# Patient Record
Sex: Male | Born: 1945
Health system: Southern US, Community
[De-identification: ages and names within clinical notes are randomized; demographics above are authoritative.]

## PROBLEM LIST (undated history)

## (undated) DIAGNOSIS — T4145XA Adverse effect of unspecified anesthetic, initial encounter: Secondary | ICD-10-CM

## (undated) DIAGNOSIS — N529 Male erectile dysfunction, unspecified: Secondary | ICD-10-CM

## (undated) DIAGNOSIS — K219 Gastro-esophageal reflux disease without esophagitis: Secondary | ICD-10-CM

## (undated) DIAGNOSIS — N401 Enlarged prostate with lower urinary tract symptoms: Secondary | ICD-10-CM

## (undated) DIAGNOSIS — E785 Hyperlipidemia, unspecified: Secondary | ICD-10-CM

## (undated) DIAGNOSIS — Z87442 Personal history of urinary calculi: Secondary | ICD-10-CM

## (undated) DIAGNOSIS — N200 Calculus of kidney: Secondary | ICD-10-CM

## (undated) DIAGNOSIS — T8859XA Other complications of anesthesia, initial encounter: Secondary | ICD-10-CM

## (undated) DIAGNOSIS — Z9189 Other specified personal risk factors, not elsewhere classified: Secondary | ICD-10-CM

## (undated) DIAGNOSIS — Z8739 Personal history of other diseases of the musculoskeletal system and connective tissue: Secondary | ICD-10-CM

## (undated) DIAGNOSIS — M1990 Unspecified osteoarthritis, unspecified site: Secondary | ICD-10-CM

## (undated) DIAGNOSIS — K4020 Bilateral inguinal hernia, without obstruction or gangrene, not specified as recurrent: Secondary | ICD-10-CM

## (undated) DIAGNOSIS — Z87820 Personal history of traumatic brain injury: Secondary | ICD-10-CM

## (undated) DIAGNOSIS — I480 Paroxysmal atrial fibrillation: Secondary | ICD-10-CM

## (undated) HISTORY — PX: TONSILLECTOMY: SUR1361

## (undated) HISTORY — PX: JOINT REPLACEMENT: SHX530

## (undated) HISTORY — PX: CARPAL TUNNEL RELEASE: SHX101

## (undated) HISTORY — PX: CARDIOVASCULAR STRESS TEST: SHX262

## (undated) HISTORY — PX: HERNIA REPAIR: SHX51

---

## 1981-09-29 HISTORY — PX: APPENDECTOMY: SHX54

## 2000-03-30 ENCOUNTER — Ambulatory Visit (HOSPITAL_COMMUNITY): Admission: RE | Admit: 2000-03-30 | Discharge: 2000-03-30 | Payer: Self-pay | Admitting: Urology

## 2000-03-30 ENCOUNTER — Encounter: Payer: Self-pay | Admitting: Urology

## 2002-05-11 ENCOUNTER — Inpatient Hospital Stay (HOSPITAL_COMMUNITY): Admission: RE | Admit: 2002-05-11 | Discharge: 2002-05-14 | Payer: Self-pay | Admitting: Orthopaedic Surgery

## 2002-05-11 HISTORY — PX: TOTAL HIP ARTHROPLASTY: SHX124

## 2007-01-19 ENCOUNTER — Ambulatory Visit: Payer: Self-pay | Admitting: Internal Medicine

## 2007-01-29 ENCOUNTER — Ambulatory Visit: Payer: Self-pay | Admitting: Internal Medicine

## 2007-07-15 LAB — HM COLONOSCOPY: HM Colonoscopy: NORMAL

## 2010-03-25 ENCOUNTER — Ambulatory Visit: Payer: Self-pay | Admitting: Family Medicine

## 2010-03-26 ENCOUNTER — Telehealth (INDEPENDENT_AMBULATORY_CARE_PROVIDER_SITE_OTHER): Payer: Self-pay | Admitting: *Deleted

## 2010-03-26 DIAGNOSIS — R9431 Abnormal electrocardiogram [ECG] [EKG]: Secondary | ICD-10-CM | POA: Insufficient documentation

## 2010-04-02 ENCOUNTER — Encounter: Payer: Self-pay | Admitting: Family Medicine

## 2010-04-15 ENCOUNTER — Encounter: Payer: Self-pay | Admitting: Family Medicine

## 2010-05-01 ENCOUNTER — Encounter: Payer: Self-pay | Admitting: Cardiology

## 2010-05-01 ENCOUNTER — Ambulatory Visit: Payer: Self-pay | Admitting: Cardiology

## 2010-05-01 DIAGNOSIS — I480 Paroxysmal atrial fibrillation: Secondary | ICD-10-CM | POA: Insufficient documentation

## 2010-05-01 DIAGNOSIS — E783 Hyperchylomicronemia: Secondary | ICD-10-CM | POA: Insufficient documentation

## 2010-05-01 DIAGNOSIS — F17200 Nicotine dependence, unspecified, uncomplicated: Secondary | ICD-10-CM | POA: Insufficient documentation

## 2010-05-07 ENCOUNTER — Telehealth (INDEPENDENT_AMBULATORY_CARE_PROVIDER_SITE_OTHER): Payer: Self-pay | Admitting: *Deleted

## 2010-05-08 ENCOUNTER — Encounter: Payer: Self-pay | Admitting: Cardiology

## 2010-05-08 ENCOUNTER — Ambulatory Visit: Payer: Self-pay

## 2010-05-08 ENCOUNTER — Ambulatory Visit (HOSPITAL_COMMUNITY): Admission: RE | Admit: 2010-05-08 | Discharge: 2010-05-08 | Payer: Self-pay | Admitting: Cardiology

## 2010-05-08 ENCOUNTER — Ambulatory Visit: Payer: Self-pay | Admitting: Cardiology

## 2010-05-08 ENCOUNTER — Encounter (HOSPITAL_COMMUNITY): Admission: RE | Admit: 2010-05-08 | Discharge: 2010-06-24 | Payer: Self-pay | Admitting: Cardiology

## 2010-05-08 HISTORY — PX: TRANSTHORACIC ECHOCARDIOGRAM: SHX275

## 2010-05-31 ENCOUNTER — Ambulatory Visit: Payer: Self-pay | Admitting: Cardiology

## 2010-08-09 ENCOUNTER — Telehealth: Payer: Self-pay | Admitting: Family Medicine

## 2010-08-12 ENCOUNTER — Encounter: Payer: Self-pay | Admitting: Family Medicine

## 2010-08-12 DIAGNOSIS — K4090 Unilateral inguinal hernia, without obstruction or gangrene, not specified as recurrent: Secondary | ICD-10-CM | POA: Insufficient documentation

## 2010-08-12 DIAGNOSIS — K469 Unspecified abdominal hernia without obstruction or gangrene: Secondary | ICD-10-CM

## 2010-08-15 ENCOUNTER — Telehealth (INDEPENDENT_AMBULATORY_CARE_PROVIDER_SITE_OTHER): Payer: Self-pay | Admitting: *Deleted

## 2010-08-16 ENCOUNTER — Encounter: Payer: Self-pay | Admitting: Family Medicine

## 2010-08-26 ENCOUNTER — Ambulatory Visit: Payer: Self-pay | Admitting: Family Medicine

## 2010-09-02 ENCOUNTER — Telehealth (INDEPENDENT_AMBULATORY_CARE_PROVIDER_SITE_OTHER): Payer: Self-pay | Admitting: *Deleted

## 2010-09-04 ENCOUNTER — Ambulatory Visit (HOSPITAL_COMMUNITY)
Admission: RE | Admit: 2010-09-04 | Discharge: 2010-09-04 | Payer: Self-pay | Source: Home / Self Care | Attending: Surgery | Admitting: Surgery

## 2010-09-04 HISTORY — PX: LAPAROSCOPIC INGUINAL HERNIA REPAIR: SUR788

## 2010-09-17 ENCOUNTER — Encounter: Payer: Self-pay | Admitting: Family Medicine

## 2010-10-27 LAB — CONVERTED CEMR LAB
ALT: 26 units/L (ref 0–53)
AST: 37 units/L (ref 0–37)
Albumin: 4.6 g/dL (ref 3.5–5.2)
Alkaline Phosphatase: 65 units/L (ref 39–117)
BUN: 22 mg/dL (ref 6–23)
Basophils Absolute: 0.2 10*3/uL — ABNORMAL HIGH (ref 0.0–0.1)
Basophils Relative: 2.4 % (ref 0.0–3.0)
Bilirubin, Direct: 0.2 mg/dL (ref 0.0–0.3)
CO2: 31 meq/L (ref 19–32)
Calcium: 8.9 mg/dL (ref 8.4–10.5)
Chloride: 105 meq/L (ref 96–112)
Cholesterol: 199 mg/dL (ref 0–200)
Creatinine, Ser: 1.1 mg/dL (ref 0.4–1.5)
Direct LDL: 121.7 mg/dL
Eosinophils Absolute: 0.1 10*3/uL (ref 0.0–0.7)
Eosinophils Relative: 1.4 % (ref 0.0–5.0)
GFR calc non Af Amer: 74.31 mL/min (ref >60)
Glucose, Bld: 100 mg/dL — ABNORMAL HIGH (ref 70–99)
HCT: 37.6 % — ABNORMAL LOW (ref 39.0–52.0)
HDL: 41.2 mg/dL (ref >39.00)
Hemoglobin: 12.8 g/dL — ABNORMAL LOW (ref 13.0–17.0)
Lymphocytes Relative: 20.1 % (ref 12.0–46.0)
Lymphs Abs: 1.7 10*3/uL (ref 0.7–4.0)
MCHC: 34.1 g/dL (ref 30.0–36.0)
MCV: 92 fL (ref 78.0–100.0)
Monocytes Absolute: 0.6 10*3/uL (ref 0.1–1.0)
Monocytes Relative: 7 % (ref 3.0–12.0)
Neutro Abs: 6 10*3/uL (ref 1.4–7.7)
Neutrophils Relative %: 69.1 % (ref 43.0–77.0)
PSA: 1.27 ng/mL (ref 0.10–4.00)
Platelets: 206 10*3/uL (ref 150.0–400.0)
Potassium: 4.4 meq/L (ref 3.5–5.1)
RBC: 4.08 M/uL — ABNORMAL LOW (ref 4.22–5.81)
RDW: 12.8 % (ref 11.5–14.6)
Sodium: 143 meq/L (ref 135–145)
TSH: 2.16 microintl units/mL (ref 0.35–5.50)
Total Bilirubin: 1 mg/dL (ref 0.3–1.2)
Total CHOL/HDL Ratio: 5
Total Protein: 7.4 g/dL (ref 6.0–8.3)
Triglycerides: 229 mg/dL — ABNORMAL HIGH (ref 0.0–149.0)
VLDL: 45.8 mg/dL — ABNORMAL HIGH (ref 0.0–40.0)
WBC: 8.6 10*3/uL (ref 4.5–10.5)

## 2010-10-29 NOTE — Miscellaneous (Signed)
Summary: Appointment No Show  Appointment status changed to no show by [employer] on 04/15/2010 10:36 AM.  No Show Comments ---------------- echo/abn ekg/Asymtomatic Bigeminal Premature Contractions  Appointment Information ----------------------- Appt Type:  CARDIOLOGY ANCILLARY VISIT      Date:  Friday, April 12, 2010      Time:  4:00 PM for 60 min   Urgency:  Routine   Made By:  Pearson Grippe  To Visit:  [id number]    Reason:  echo/abn ekg/Asymtomatic Bigeminal Premature Contractions  Appt Comments ------------- -- 04/15/10 10:36: ([facility]) NO SHOW -- echo/abn ekg/Asymtomatic Bigeminal Premature Contractions -- 04/11/10 14:32: ([facility]) BOOKED -- Routine CARDIOLOGY ANCILLARY VISIT at 04/12/2010 4:00 PM for 60 min echo/abn ekg/Asymtomatic Bigeminal Premature Contracti

## 2010-10-29 NOTE — Progress Notes (Signed)
Summary: FENOFIBRATE REFILL   Phone Note Refill Request Message from:  Fax from Pharmacy on August 15, 2010 8:43 AM  Refills Requested: Medication #1:  FENOFIBRATE 160 MG TABS take one tablet at bedtime [facility] [address] - fax [phone] - tel [phone]  Next Appointment Scheduled: none Initial call taken by: Okey Regal [place],  August 15, 2010 8:45 AM    Prescriptions: FENOFIBRATE 160 MG TABS (FENOFIBRATE) take one tablet at bedtime  #30 x 3   Entered by:   Doristine Devoid CMA   Authorized by:   Loreen Freud DO   Signed by:   Doristine Devoid CMA on 08/15/2010   Method used:   Electronically to        [facility] [address] (retail)       [phone]       [place], [place]  [address]       Ph: [phone]       Fax: [phone]   RxID:   [id number]

## 2010-10-29 NOTE — Progress Notes (Signed)
Summary: Nuclear Pre-Procedure  Phone Note Outgoing Call Call back at [facility] [place] Phone [phone]   Call placed by: Stanton Kidney, EMT-P,  May 07, 2010 3:28 PM Action Taken: Phone Call Completed Summary of Call: Reviewed information on Myoview Information Sheet (see scanned document for further details).  Spoke with the Patient's wife.    Nuclear Med Background Indications for Stress Test: Evaluation for Ischemia, Abnormal EKG    History Comments: H/O PVC's     Nuclear Pre-Procedure Cardiac Risk Factors: Lipids, Smoker Height (in): 66.75

## 2010-10-29 NOTE — Assessment & Plan Note (Signed)
Summary: [facility]   Visit Type:  4 wk f/u Primary Provider:  Dr. Beverely Low  CC:  pt here for f/u on stress test 8/10.Marland KitchenMarland Kitchenpt weight is up 8 lb since 05-08-10 and pt states he thinks this is due to what he has on today though he does state that he has been eating more lately....  History of Present Illness: Mr. Sibal comes in today for further evaluation and management of his paroxysmal atrial fibrillation.  His 2-D echocardiogram was essentially normal except for trivial mitral regurgitation and minimal LA dilatation. Stress Myoview demonstrated fairly good exercise capacity going 6 minutes and 30 seconds. Stopped secondary to bilateral calf tightness right greater than left. There were no ST segment changes. He went a total for ablation and what appears and flutter with rapid ventricular rate in recovery the covered with a vagal maneuver. Maximum heart rate was recorded 150 beats per minute. His ejection fraction was 54% with normal Alayziah Tangeman motion with no evidence of scar or ischemia.  Current Medications (verified): 1)  Fenofibrate 160 Mg Tabs (Fenofibrate) .... Take One Tablet At Bedtime 2)  Aspirin 325 Mg Tabs (Aspirin) .... Take 1 Daily  Enteric Coated 3)  Calcium 500-125 Mg-Unit Tabs (Calcium Carbonate-Vitamin D) .Marland Kitchen.. 1 Tab Once Daily 4)  Multivitamins   Tabs (Multiple Vitamin) .Marland Kitchen.. 1 Tab Once Daily  Allergies: 1)  ! Coumadin  Past History:  Past Medical History: Last updated: 04/26/2010 Left hip osteoarthritis  Past Surgical History: Last updated: 03/25/2010 L hip replacement Appendectomy Hernia repair-Bilateral  Family History: Last updated: 03/25/2010 CAD-no HTN-father DM-sister STROKE-no COLON CA-no PROSTATE CA-father, dx'd  ~70  Social History: Last updated: 03/25/2010 married 2 children- 1 lives in [place], [place] in [place] [employer] for the [employer]  Risk Factors: Alcohol Use: <1 (03/25/2010) Exercise: yes (03/25/2010)  Risk  Factors: Smoking Status: current (03/25/2010) Cigars/wk: 2-3 (03/25/2010)  Clinical Reports Reviewed:  Nuclear Study:  05/08/2010:  Excerise capacity: Fair exercise capacity  Blood Pressure response: Normal blood pressure response  Clinical symptoms: Calf tightness, no chest pain  ECG impression: No ischemic ST changes but patient had runs of both atrial fibrillation and atrial flutter during the study.  Overall impression: No evidence for scar or ischemia. Patient had both atrial fibrillation and atrial flutter during this study.  Marca Ancona, MD   Review of Systems       negative other than history of present illness  Vital Signs:  Patient profile:   65 year old male Height:      66 inches Weight:      187.12 pounds BMI:     30.31 Pulse rate:   68 / minute Pulse rhythm:   regular BP sitting:   116 / 78  (left arm) Cuff size:   large  Vitals Entered By: Danielle Rankin, CMA (May 31, 2010 2:17 PM)  Physical Exam  General:  Well developed, well nourished, in no acute distress. Head:  normocephalic and atraumatic Eyes:  PERRLA/EOM intact; conjunctiva and lids normal. Neck:  Neck supple, no JVD. No masses, thyromegaly or abnormal cervical nodes. Chest [place]:  no deformities or breast masses noted Lungs:  Clear bilaterally to auscultation and percussion. Heart:  PMI nondisplaced, regular rate and rhythm no change Msk:  Back normal, normal gait. Muscle strength and tone normal. Pulses:  pulses normal in all 4 extremities Extremities:  No clubbing or cyanosis. Neurologic:  Alert and oriented x 3. Skin:  Intact without lesions or rashes. Psych:  Normal  affect.   Impression & Recommendations:  Problem # 1:  ATRIAL FIBRILLATION (ICD-427.31) Assessment Unchanged He is totally asymptomatic. His maximum rate even after exercise was about 150 beats per minute. He converted with a vagal maneuver. He is risk of having a thromboembolic stroke is very low and aspirin  should be adequate. I have taught him how to check his carotid pulse each morning and how to recognize atrial fibrillation. If he has it, and is asymptomatic, and he does not convert within 48 hours he is to report to the office or to the hospital. Otherwise we'll try to maintain conservative treatment. His updated medication list for this problem includes:    Aspirin 325 Mg Tabs (Aspirin) .Marland Kitchen... 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 Take one tablet at bedtime  Patient Instructions: 1)  Your physician recommends that you schedule a follow-up appointment in: 6 months with Dr. Daleen Squibb 2)  Please check your carotid pulse every morning and record as instructed to you by Dr. Daleen Squibb 3)  Your physician recommends that you continue on your current medications as directed. Please refer to the Current Medication list given to you today.

## 2010-10-29 NOTE — Assessment & Plan Note (Signed)
Summary: Cardiology Nuclear Testing  Nuclear Med Background Indications for Stress Test: Evaluation for Ischemia, Abnormal EKG  Indications Comments: New AFIB recently   History Comments: H/O PVC's     Nuclear Pre-Procedure Cardiac Risk Factors: Lipids, Smoker Caffeine/Decaff Intake: None NPO After: 11:00 PM Lungs: Clear IV 0.9% NS with Angio Cath: 22g     IV Site: (R) AC IV Started by: Irean Hong RN Chest Size (in) 43     Height (in): 66 Weight (lb): 179 BMI: 29.00 Tech Comments: The patient went into Atrial Flutter/AFIB with RVR at 2 minutes in recovery.The patient converted  back into NSR after vagal manuever..Dr.B. Crenshaw reviewed EKG's and he recommended no change in treatment today, and for the patient to keep follow-up with Dr. Algis Greenhouse per Burna Mortimer Deal/ Cleon Gustin  Nuclear Med Study 1 or 2 day study:  1 day     Stress Test Type:  Stress Reading MD:  Marca Ancona, MD     Referring MD:  T.Wall Resting Radionuclide:  Technetium 26m Tetrofosmin     Resting Radionuclide Dose:  10.8 mCi  Stress Radionuclide:  Technetium 68m Tetrofosmin     Stress Radionuclide Dose:  33.0 mCi   Stress Protocol Exercise Time (min):  6:30 min     Max HR:  150 bpm     Predicted Max HR:  158 bpm  Max Systolic BP: 158 mm Hg     Percent Max HR:  94.94 %     METS: 7.4 Rate Pressure Product:  78295    Stress Test Technologist:  Irean Hong RN     Nuclear Technologist:  Burna Mortimer Deal RT-N  Rest Procedure  Myocardial perfusion imaging was performed at rest 45 minutes following the intravenous administration of Myoview Technetium 21m Tetrofosmin.  Stress Procedure  The patient exercised for 6 minutes and 30 seconds.   The patient stopped due to bilateral calf tightness R>L, DOE  and denied any chest pain.  There were no significant ST-T wave changes, begiminy PVC's,occ. PAC. The patient went into atrial flutter/AFIB with RVR in recovery that converted after vagal maneuver.  Myoview was  injected at peak exercise and myocardial perfusion imaging was performed after a brief delay.  QPS Raw Data Images:  Normal; no motion artifact; normal heart/lung ratio. Stress Images:  NI: Uniform and normal uptake of tracer in all myocardial segments. Rest Images:  Uniform and normal uptake of tracer in all myocardial segments. Subtraction (SDS):  There is no evidence of scar or ischemia. Transient Ischemic Dilatation:  1.00  (Normal <1.22)  Lung/Heart Ratio:  .37  (Normal <0.45)  Quantitative Gated Spect Images QGS EDV:  91 ml QGS ESV:  42 ml QGS EF:  54 % QGS cine images:  Normal wall motion.    Overall Impression  Exercise Capacity: Fair exercise capacity. BP Response: Normal blood pressure response. Clinical Symptoms: Calf tightness, no chest pain.  ECG Impression: No ischemic ST changes but patient had runs of both atrial fibrillation and atrial flutter during the study.  Overall Impression: No evidence for scar or ischemia.  Patient had both atrial fibrillation and atrial flutter during this study.   Appended Document: Cardiology Nuclear Testing Good news. No suggestion of obsrtuctive CAD. Kep followup appointment.  Appended Document: Cardiology Nuclear Testing LMTCB. Mylo Red RN  Appended Document: Cardiology Nuclear Testing LMTCB. Mylo Red RN  Appended Document: Cardiology Nuclear Testing Pt's wife aware of results.  They would like a copy of the report at his next visit  for his daughter who is a cardiologist ( [employer]). Mylo Red RN

## 2010-10-29 NOTE — Assessment & Plan Note (Signed)
Summary: treadmil abn ekg/jm   Visit Type:  new pt visit Primary Provider:  Dr. Beverely Low  CC:  pt was here today for a treadmill test in our office and was presented to Dr. Daleen Squibb w/pt in afib as per EKG ...pt was then added on to schedule today as new pt visit....pt offers no cardiac complaints today and says he plays tennis 2-3 times weekly w/o problems.  History of Present Illness: [name] Short is a pleasant 65 year old white male who came today for treadmill for PVCs. He is referred by Dr. Beverely Low.  When he arrived for the treadmill, he was found to be in atrial fibrillation which is a new diagnosis. Heart rates running about 80-90 beats per minute. There no other EKG changes.  He is totally asymptomatic. He specifically denies any palpitations, dyspnea on exertion, presyncope or syncope.  He specifically has no history of hypertension, diabetes, coronary artery disease, congestive heart failure, or valvular heart disease.  He has been on Coumadin after hip surgery years ago. He apparently had a rash that was over his back at that time and was told he was allergic to the drug.  He only drinks an occasional ear. He does smoke about 2-3 cigars a week.  He plays tennis 2-3 times a week.  He denies any bleeding history or diathesis.  We canceled the treadmill.    Current Medications (verified): 1)  Fenofibrate 160 Mg Tabs (Fenofibrate) .... Take One Tablet At Bedtime  Allergies: 1)  ! 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  Risk Factors: Alcohol  Use: <1 (03/25/2010) Exercise: yes (03/25/2010)  Risk Factors: Smoking Status: current (03/25/2010) Cigars/wk: 2-3 (03/25/2010)  Review of Systems       negative other than history of present illness  Vital Signs:  Patient profile:   65 year old male Height:      66.75 inches Weight:      185 pounds BMI:     29.30 Pulse rate:   70 / minute Pulse rhythm:   irregular BP sitting:   108 / 80  (left arm) Cuff size:   large  Vitals Entered By: Danielle Rankin, CMA (May 01, 2010 4:29 PM)  Physical Exam  General:  no acute distress, mildly overweight Head:  normocephalic and atraumatic Eyes:  PERRLA/EOM intact; conjunctiva and lids normal. Neck:  Neck supple, no JVD. No masses, thyromegaly or abnormal cervical nodes. Chest [name] Short:  no deformities or breast masses noted Lungs:  Clear bilaterally to auscultation and percussion. Heart:  PMI nondisplaced, irregular rate and rhythm, there S1-S2, no murmur. Abdomen:  Bowel sounds positive; abdomen soft and non-tender without masses, organomegaly, or hernias noted. No hepatosplenomegaly. Msk:  Back normal, normal gait. Muscle strength and tone normal. Pulses:  pulses normal in all 4 extremities Extremities:  No clubbing or cyanosis. Neurologic:  Alert and oriented x 3. Skin:  Intact without lesions or rashes. Psych:  Normal affect.   EKG  Procedure date:  05/01/2010  Findings:      patient fibrillation, no ST segment changes.  Impression & Recommendations:  Problem # 1:  ATRIAL FIBRILLATION (ICD-427.31) Assessment New This is a  new diagnosis. He is totally asymptomatic. Rate well controlled. Exam is grossly normal. EKG is unremarkable except for the atrial fib. Recent blood work including a TSH was normal. He is a low risk of thromboembolic stroke.  I asked him to start aspirin 325 mg a day. We'll arrange for 2-D echocardiogram and stress Myoview with his cardiac risk factors. We'll do a exercise study looking at heart rate  response as well as blood pressure. I will not start any rate slowing drugs today. His updated medication list for this problem includes:    Aspirin 325 Mg Tabs (Aspirin) .Marland Kitchen... Take 1 daily  enteric coated  Orders: EKG w/ Interpretation (93000) Nuclear Stress Test (Nuc Stress Test) Echocardiogram (Echo)  Problem # 2:  TOBACCO USER (ICD-305.1) advised to quit  Problem # 3:  ABNORMAL ELECTROCARDIOGRAM (ICD-794.31)  His updated medication list for this problem includes:    Aspirin 325 Mg Tabs (Aspirin) .Marland Kitchen... 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 Take one tablet at bedtime  Patient Instructions: 1)  Your physician recommends that you schedule a follow-up appointment in: 3-4 WEEKS with Dr. Daleen Squibb  05/31/10 at 1:45 pm 2)  Your physician has recommended you make the following change in your medication: START  EC Aspirin 325mg  daily 3)  Your physician has requested that you have an echocardiogram.  Echocardiography is a painless test that uses sound waves to create images of your heart. It provides your doctor with information about the size and shape of your heart and how well your heart's chambers and valves are working.  This procedure takes approximately one hour. There are no restrictions for this procedure. 4)  Your physician has requested that you have an exercise stress myoview.  For further information please visit [email or web address]  Please follow instruction sheet, as given.

## 2010-10-29 NOTE — Progress Notes (Signed)
Summary: Records Request  Faxed OV, Echo & Stress to [facility] at [facility] Pre[facility] ([phone]). Debby Freiberg  September 02, 2010 8:46 AM

## 2010-10-29 NOTE — Progress Notes (Signed)
Summary: surgeon referral (lmom 11/11)--pt called back  Phone Note Call from Patient Call back at Home Phone [phone]   Summary of Call: Patient spouse left message on triage that it was recommended to husband that he have hernia repair. Please make referral and call her with appt.  Is this correct and which surgeon do you prefer? Initial call taken by: Lucious Groves CMA,  August 09, 2010 8:22 AM  Follow-up for Phone Call        not sure where pt has hernia or who recommended repair.  have only seen pt 1 time.  if pt truly has hernia have no problem w/ him seeing [facility] for appt. Follow-up by: Neena Rhymes MD,  August 09, 2010 8:54 AM  Additional Follow-up for Phone Call Additional follow up Details #1::        left message to call back.Harold Barban  August 09, 2010 9:22 AM  Patient's wife called back--says that hernia is in groin area--she does not know which side--per above note, please refer him to [facility] for appt.Jerolyn Shin  August 09, 2010 4:17 PM    Additional Follow-up for Phone Call Additional follow up Details #2::    PATIENT SCHEDULED W/CCS ON 08-27-2010 (1ST AVAIL), PATIENT NOTIFIED. Magdalen Spatz [facility]  August 12, 2010 9:22 AM

## 2010-10-29 NOTE — Miscellaneous (Signed)
Summary: Orders Update  Clinical Lists Changes  Problems: Added new problem of HERNIA (ICD-553.9) Orders: Added new Referral order of Surgical Referral (Surgery) - Signed

## 2010-10-29 NOTE — Assessment & Plan Note (Signed)
Summary: new to estab/cbs   Vital Signs:  Patient profile:   65 year old male Height:      66.75 inches Weight:      185 pounds BMI:     29.30 Pulse rate:   68 / minute BP sitting:   120 / 80  (left arm)  Vitals Entered By: Doristine Devoid (March 25, 2010 10:12 AM) CC: NEW EST- CPX AND LABS    History of Present Illness: 65 yo man here today to establish care.  previous MD- Kindl.  desires CPE.  no concerns.  Preventive Screening-Counseling & Management  Alcohol-Tobacco     Alcohol drinks/day: <1     Smoking Status: current     Cigars/week: 2-3  Caffeine-Diet-Exercise     Does Patient Exercise: yes     Type of exercise: tennis     Times/week: <3      Sexual History:  currently monogamous.        Drug Use:  never.    Current Medications (verified): 1)  None  Allergies (verified): 1)  ! 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    Physical Exam  General:  Well-developed,well-nourished,in no acute distress; alert,appropriate and cooperative throughout examination Head:  Normocephalic and atraumatic without obvious abnormalities. No apparent alopecia or  balding. Eyes:  No corneal or conjunctival inflammation noted. EOMI. Perrla. Funduscopic exam benign, without hemorrhages, exudates or papilledema. Vision grossly normal. Ears:  External ear exam shows no significant lesions or deformities.  Otoscopic examination reveals clear canals, tympanic membranes are intact bilaterally without bulging, retraction, inflammation or discharge. Hearing is grossly normal bilaterally. Nose:  External nasal examination shows no deformity or inflammation. Nasal mucosa are pink and moist without lesions or exudates. Mouth:  Oral mucosa and oropharynx without lesions or exudates.  Teeth in good repair. Neck:  No deformities, masses, or tenderness noted. Lungs:  Normal respiratory effort, chest expands symmetrically. Lungs are clear to auscultation, no crackles or wheezes. Heart:  reg S1/S2, no M/R/G Abdomen:  Bowel sounds positive,abdomen soft and non-tender without masses, organomegaly or hernias noted. Rectal:  No external abnormalities noted. Normal sphincter tone. No rectal masses or tenderness. Genitalia:  Testes bilaterally descended without nodularity, tenderness or masses. No scrotal masses or lesions. No penis lesions or urethral discharge. Prostate:  Prostate gland firm and smooth, no enlargement, nodularity, tenderness, mass, asymmetry or induration. Msk:  No deformity or scoliosis noted of thoracic or lumbar spine.   Pulses:  +2 carotid, radial, DP Extremities:  No clubbing, cyanosis, edema, or deformity noted with normal full range of motion of all joints.   Neurologic:  No cranial nerve deficits noted. Station and gait are normal. Plantar reflexes are down-going bilaterally. DTRs are symmetrical throughout. Sensory, motor and coordinative functions appear intact. Skin:  Intact without suspicious lesions or rashes Cervical Nodes:  No  lymphadenopathy noted Inguinal Nodes:  No significant adenopathy Psych:  Cognition and judgment appear intact. Alert and  cooperative with normal attention span and concentration. No apparent delusions, illusions, hallucinations   Impression & Recommendations:  Problem # 1:  PHYSICAL EXAMINATION (ICD-V70.0) Assessment New Pt's PE WNL.  obtained EKG as baseline- in bigeminy.  will refer to cards for ECHO and treadmill stress test.  check labs.  anticipatory guidance provided. Orders: Venipuncture (16109) TLB-Lipid Panel (80061-LIPID) TLB-BMP (Basic Metabolic Panel-BMET) (80048-METABOL) TLB-CBC Platelet - w/Differential (85025-CBCD) TLB-Hepatic/Liver Function Pnl (80076-HEPATIC) TLB-TSH (Thyroid Stimulating Hormone) (84443-TSH) TLB-PSA (Prostate Specific Antigen) (84153-PSA) EKG w/ Interpretation (93000)  Problem # 2:  ABNORMAL ELECTROCARDIOGRAM (ICD-794.31) Assessment: New  See above  Orders: Echo Referral (Echo) Cardiology Referral (Cardiology)  Complete Medication List: 1)  Fenofibrate 160 Mg Tabs (Fenofibrate) .... Take one tablet at bedtime  Patient Instructions: 1)  We'll notify you of your lab results 2)  Keep up the good work on diet and exercise- your exam looks great! 3)  I would recommend a skin check by a dermatologist- call if you would like a referral 4)  Call with any questions or concerns 5)  Have a great summer!!

## 2010-10-29 NOTE — Progress Notes (Signed)
Summary: labs  Phone Note Outgoing Call   Call placed by: Doristine Devoid,  March 26, 2010 1:31 PM Call placed to: Patient Summary of Call: labs look good w/ exception of elevated triglycerides- needs to start fenofibrate 160mg  nightly and recheck LFTs in 6-8 weeks.  Follow-up for Phone Call        left message on machine ..........Marland KitchenDoristine Devoid  March 26, 2010 1:31 PM   spoke w/ patient aware of labs and referral for echo....Marland KitchenMarland KitchenDoristine Devoid  March 27, 2010 10:47 AM   New Problems: ABNORMAL ELECTROCARDIOGRAM (ICD-794.31)   New Problems: ABNORMAL ELECTROCARDIOGRAM (ICD-794.31) New/Updated Medications: FENOFIBRATE 160 MG TABS (FENOFIBRATE) take one tablet at bedtime Prescriptions: FENOFIBRATE 160 MG TABS (FENOFIBRATE) take one tablet at bedtime  #30 x 3   Entered by:   Doristine Devoid   Authorized by:   Neena Rhymes MD   Signed by:   Doristine Devoid on 03/27/2010   Method used:   Electronically to        [facility] [address] (retail)       [phone]       [place], [place]  [address]       Ph: [phone]       Fax: [phone]   RxID:   [id number]   Appended Document: labs pt also has referral for exercise stress test.  EKG showed variation on a normal rhythm- this is likely nothing to worry about but after talking w/ cardiology they recommended pt have both ECHO and stress test.  please let him know.

## 2010-10-29 NOTE — Assessment & Plan Note (Signed)
Summary: FLU SHOT/CBS  Nurse Visit   Allergies: 1)  ! Coumadin  Orders Added: 1)  Admin 1st Vaccine [90471] 2)  Flu Vaccine 52yrs + [16109] Flu Vaccine Consent Questions     Do you have a history of severe allergic reactions to this vaccine? no    Any prior history of allergic reactions to egg and/or gelatin? no    Do you have a sensitivity to the preservative Thimersol? no    Do you have a past history of Guillan-Barre Syndrome? no    Do you currently have an acute febrile illness? no    Have you ever had a severe reaction to latex? no    Vaccine information given and explained to patient? yes    Are you currently pregnant? no    Lot Number:[id number]   Exp Date:03/29/2011   Site Given  Left Deltoid IM .lbflu

## 2010-10-29 NOTE — Consult Note (Signed)
Summary: [facility]   Imported By: Lanelle Bal 08/29/2010 07:51:25  _____________________________________________________________________  External Attachment:    Type:   Image     Comment:   External Document

## 2010-10-29 NOTE — Assessment & Plan Note (Signed)
Summary: f4w/dfg   Allergies: 1)  ! Coumadin

## 2010-10-29 NOTE — Miscellaneous (Signed)
Summary: Appointment Canceled  Appointment status changed to canceled by [employer] on 04/02/2010 10:24 AM.  Cancellation Comments --------------------- echo/ asymptomat biemina premature contraction/ pt has bcsb. 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 precret requrie/ gd  Appt Comments ------------- -- 04/02/10 10:24: ([facility]) CANCELED -- echo/ asymptomat biemina premature contraction/ pt has bcsb. precret requrie/ gd -- 03/27/10 11:27: ([facility]) BOOKED -- Routine CARDIOLOGY ANCILLARY VISIT at 04/11/2010 2:00 PM for 60 min echo/ asymptomat biemina premat

## 2010-10-31 NOTE — Letter (Signed)
Summary: [facility]   Imported By: Lanelle Bal 10/08/2010 13:11:30  _____________________________________________________________________  External Attachment:    Type:   Image     Comment:   External Document

## 2010-12-09 LAB — CBC
HCT: 42.4 % (ref 39.0–52.0)
Hemoglobin: 14.1 g/dL (ref 13.0–17.0)
MCH: 30.1 pg (ref 26.0–34.0)
MCHC: 33.3 g/dL (ref 30.0–36.0)
MCV: 90.6 fL (ref 78.0–100.0)
Platelets: 237 10*3/uL (ref 150–400)
RBC: 4.68 MIL/uL (ref 4.22–5.81)
RDW: 12.6 % (ref 11.5–15.5)
WBC: 7.7 10*3/uL (ref 4.0–10.5)

## 2010-12-09 LAB — BASIC METABOLIC PANEL
BUN: 17 mg/dL (ref 6–23)
CO2: 28 mEq/L (ref 19–32)
Calcium: 10 mg/dL (ref 8.4–10.5)
Chloride: 102 mEq/L (ref 96–112)
Creatinine, Ser: 1.16 mg/dL (ref 0.4–1.5)
GFR calc Af Amer: >60 mL/min (ref >60)
GFR calc non Af Amer: >60 mL/min (ref >60)
Glucose, Bld: 108 mg/dL — ABNORMAL HIGH (ref 70–99)
Potassium: 4.3 mEq/L (ref 3.5–5.1)
Sodium: 138 mEq/L (ref 135–145)

## 2010-12-09 LAB — SURGICAL PCR SCREEN
MRSA, PCR: NEGATIVE
Staphylococcus aureus: POSITIVE — AB

## 2011-02-14 NOTE — Op Note (Signed)
NAMECAITLIN, HILLMER                        ACCOUNT NO.:  [id number]   MEDICAL RECORD NO.:  [id number]                   PATIENT TYPE:  INP   LOCATION:  NA                                   FACILITY:  [facility]   PHYSICIAN:  Mark C. Ophelia Charter, M.D.                 DATE OF BIRTH:  03/05/1946   DATE OF PROCEDURE:  05/11/2002  DATE OF DISCHARGE:                                 OPERATIVE REPORT   PREOPERATIVE DIAGNOSES:  Left hip osteoarthritis.   POSTOPERATIVE DIAGNOSES:  Left hip osteoarthritis.   OPERATION PERFORMED:  Left total hip arthroplasty, cement ceramic on  ceramic.   SURGEON:  Mark C. Ophelia Charter, M.D.   ASSISTANT:  Genene Churn. Denton Meek.   ANESTHESIA:  General.   ESTIMATED BLOOD LOSS:  300 ml.   COMPONENTS:  Osteonics Secure Fit plus stem #9 stem, 13.5 distal tip, +0  neck, 36 mm ball, 58 mm cup.   DESCRIPTION OF PROCEDURE:  After induction of general anesthesia,  orotracheal intubation, the patient was placed in the lateral position.  Preoperative Ancef was given prophylactically.  The usual DuraPrep prepping  and draping was performed.  Impervious stockinet, Coban and sterile skin  marker, Betadine Vi-drape application time two was used to seal the skin.  The posterior approach was made.  The gluteus maximus was split in line with  the fibers.  Charnley retractor was placed, piriformis was tagged and cut.  The posterior capsule was incised, short external rotators peeled back off  the bone.  The hip was dislocated.  The neck was clipped, cut slightly more  than one fingerbreadth above the lesser trochanter.  Sequential reaming,  broaching, distal reaming was performed up to a #9 with a finishing broach.  On the acetabular side the degenerative labrum was removed.  Marginal  osteophytes trimmed back.  Sequential reaming was performed up to 58 mm so  that a 36 mm ball could be used.  There was a solid fit, trials were made.  The tip was 3 to 4 mm long and slight additional  neck was removed using a  calcar reamer until  it was exactly one fingerbreadth above the lesser  trochanter.  Sequential broaching was performed again.  Finishing broach was  used followed by permanent acetabular placement after irrigation, ceramic  liner placement, stem placement and 36 mm ceramic ball.  The hip was reduced  and there were identical findings found on trials with flexion 90 degrees,  internal rotation 90 degrees, full extension, knee flexion to 120 degrees  with the knee in extension, stable with external rotation and extension with  no anterior subluxation.  After with irrigation with saline solution, the  piriformis was repaired to the gluteus medius.  The tensor fascia was closed  with #1 Tycron and the gluteus maximus reapproximated with 0 Vicryl, 2-0  Vicryl in the subcutaneous tissues.  Marcaine 0.25% infiltration in the  skin, skin staple closure.  Postoperative dressing.  On final preparation  with driving the femoral prosthesis down into the canal, a small crack  occurred in the calcar at the posteromedial border.  There was a small rim  of cancellous bone present medially and anteriorly.  Posteriorly there was  no cancellous bone left and this is where the small crack occurred that  extended down to the lesser trochanter but did not go into the lesser  trochanter.  The thermal stem was down to the end of the textured surface  which was the appropriate length and the ball was impacted and there was no  spreading of the crack so it was elected not to apply a wire since the femur  was stable and the crack only propagated a few millimeters.  The hip was  rotated, checked for stability.  A finger was placed on  the crack and there was no change.  It was inspected prior to closure.  After skin staple closure, Xeroform, 4 x 4s, ABD was applied and knee  immobilizer.  Sponge and instrument counts were correct.  The patient was  then transferred to the recovery room in  stable condition.                                                 Mark C. Ophelia Charter, M.D.    MCY/MEDQ  D:  05/11/2002  T:  05/13/2002  Job:  [id number]

## 2012-04-29 ENCOUNTER — Encounter: Payer: Self-pay | Admitting: Family Medicine

## 2012-05-17 ENCOUNTER — Ambulatory Visit (INDEPENDENT_AMBULATORY_CARE_PROVIDER_SITE_OTHER): Payer: 59 | Admitting: Family Medicine

## 2012-05-17 ENCOUNTER — Encounter: Payer: Self-pay | Admitting: Family Medicine

## 2012-05-17 VITALS — BP 122/82 | HR 67 | Temp 98.2°F | Ht 65.5 in | Wt 183.0 lb

## 2012-05-17 DIAGNOSIS — Z23 Encounter for immunization: Secondary | ICD-10-CM

## 2012-05-17 DIAGNOSIS — Z0000 Encounter for general adult medical examination without abnormal findings: Secondary | ICD-10-CM

## 2012-05-17 DIAGNOSIS — Z2911 Encounter for prophylactic immunotherapy for respiratory syncytial virus (RSV): Secondary | ICD-10-CM

## 2012-05-17 LAB — CBC WITH DIFFERENTIAL/PLATELET
Basophils Absolute: 0 10*3/uL (ref 0.0–0.1)
Basophils Relative: 0.4 % (ref 0.0–3.0)
Eosinophils Absolute: 0.2 10*3/uL (ref 0.0–0.7)
Eosinophils Relative: 1.9 % (ref 0.0–5.0)
HCT: 42.2 % (ref 39.0–52.0)
Hemoglobin: 13.9 g/dL (ref 13.0–17.0)
Lymphocytes Relative: 24.2 % (ref 12.0–46.0)
Lymphs Abs: 2.1 10*3/uL (ref 0.7–4.0)
MCHC: 32.9 g/dL (ref 30.0–36.0)
MCV: 90.9 fl (ref 78.0–100.0)
Monocytes Absolute: 0.5 10*3/uL (ref 0.1–1.0)
Monocytes Relative: 6.3 % (ref 3.0–12.0)
Neutro Abs: 5.8 10*3/uL (ref 1.4–7.7)
Neutrophils Relative %: 67.2 % (ref 43.0–77.0)
Platelets: 209 10*3/uL (ref 150.0–400.0)
RBC: 4.65 Mil/uL (ref 4.22–5.81)
RDW: 13.2 % (ref 11.5–14.6)
WBC: 8.6 10*3/uL (ref 4.5–10.5)

## 2012-05-17 LAB — HEPATIC FUNCTION PANEL
ALT: 24 U/L (ref 0–53)
AST: 26 U/L (ref 0–37)
Albumin: 4.4 g/dL (ref 3.5–5.2)
Alkaline Phosphatase: 71 U/L (ref 39–117)
Bilirubin, Direct: 0 mg/dL (ref 0.0–0.3)
Total Bilirubin: 0.6 mg/dL (ref 0.3–1.2)
Total Protein: 7.5 g/dL (ref 6.0–8.3)

## 2012-05-17 LAB — LIPID PANEL
Cholesterol: 206 mg/dL — ABNORMAL HIGH (ref 0–200)
HDL: 43 mg/dL (ref >39.00)
Total CHOL/HDL Ratio: 5
Triglycerides: 230 mg/dL — ABNORMAL HIGH (ref 0.0–149.0)
VLDL: 46 mg/dL — ABNORMAL HIGH (ref 0.0–40.0)

## 2012-05-17 LAB — BASIC METABOLIC PANEL
BUN: 21 mg/dL (ref 6–23)
CO2: 30 mEq/L (ref 19–32)
Calcium: 9.5 mg/dL (ref 8.4–10.5)
Chloride: 105 mEq/L (ref 96–112)
Creatinine, Ser: 1 mg/dL (ref 0.4–1.5)
GFR: 82.65 mL/min (ref >60.00)
Glucose, Bld: 97 mg/dL (ref 70–99)
Potassium: 3.7 mEq/L (ref 3.5–5.1)
Sodium: 143 mEq/L (ref 135–145)

## 2012-05-17 LAB — PSA: PSA: 1.49 ng/mL (ref 0.10–4.00)

## 2012-05-17 LAB — LDL CHOLESTEROL, DIRECT: Direct LDL: 128.1 mg/dL

## 2012-05-17 LAB — TSH: TSH: 2.56 u[IU]/mL (ref 0.35–5.50)

## 2012-05-17 NOTE — Progress Notes (Signed)
  Subjective:    Patient ID: [name] Short, male    DOB: [birth year], 66 y.o.   MRN: [id number]  HPI CPE- no concerns today.  UTD on colonoscopy (at age 72)   Review of Systems Patient reports no vision/hearing changes, anorexia, fever ,adenopathy, persistant/recurrent hoarseness, swallowing issues, chest pain, palpitations, edema, persistant/recurrent cough, hemoptysis, dyspnea (rest,exertional, paroxysmal nocturnal), gastrointestinal  bleeding (melena, rectal bleeding), abdominal pain, excessive heart burn, GU symptoms (dysuria, hematuria, voiding/incontinence issues) syncope, focal weakness, memory loss, numbness & tingling, skin/hair/nail changes, depression, anxiety, abnormal bruising/bleeding, musculoskeletal symptoms/signs.     Objective:   Physical Exam BP 122/82  Pulse 67  Temp 98.2 F (36.8 C) (Oral)  Ht 5' 5.5" (1.664 m)  Wt 183 lb (83.008 kg)  BMI 29.99 kg/m2  SpO2 98%  General Appearance:    Alert, cooperative, no distress, appears stated age  Head:    Normocephalic, without obvious abnormality, atraumatic  Eyes:    PERRL, conjunctiva/corneas clear, EOM's intact, fundi    benign, both eyes       Ears:    Normal TM's and external ear canals, both ears  Nose:   Nares normal, septum midline, mucosa normal, no drainage   or sinus tenderness  Throat:   Lips, mucosa, and tongue normal; teeth and gums normal  Neck:   Supple, symmetrical, trachea midline, no adenopathy;       thyroid:  No enlargement/tenderness/nodules  Back:     Symmetric, no curvature, ROM normal, no CVA tenderness  Lungs:     Clear to auscultation bilaterally, respirations unlabored  Chest wall:    No tenderness or deformity  Heart:    Regular rate and rhythm, S1 and S2 normal, no murmur, rub   or gallop  Abdomen:     Soft, non-tender, bowel sounds active all four quadrants,    no masses, no organomegaly  Genitalia:    Normal male without lesion, discharge or tenderness  Rectal:    Normal tone, normal  prostate, no masses or tenderness;  Extremities:   Extremities normal, atraumatic, no cyanosis or edema  Pulses:   2+ and symmetric all extremities  Skin:   Skin color, texture, turgor normal, no rashes or lesions  Lymph nodes:   Cervical, supraclavicular, and axillary nodes normal  Neurologic:   CNII-XII intact. Normal strength, sensation and reflexes      throughout          Assessment & Plan:

## 2012-05-17 NOTE — Patient Instructions (Addendum)
We'll notify you of your lab results and make any changes if needed Keep up the good work!  You look great! Call with any questions or concerns Enjoy what's left of summer!!!

## 2012-05-18 NOTE — Assessment & Plan Note (Signed)
Pt's PE WNL.  UTD on colonoscopy.  Check labs.  Anticipatory guidance provided.  

## 2012-05-21 ENCOUNTER — Encounter: Payer: Self-pay | Admitting: *Deleted

## 2012-05-21 MED ORDER — FENOFIBRATE 160 MG PO TABS: 160.0000 mg | ORAL_TABLET | Freq: Every day | ORAL | Status: DC

## 2012-05-21 NOTE — Addendum Note (Signed)
Addended by: Derry Lory A on: 05/21/2012 11:42 AM   Modules accepted: Orders

## 2012-08-24 ENCOUNTER — Ambulatory Visit (INDEPENDENT_AMBULATORY_CARE_PROVIDER_SITE_OTHER): Payer: 59

## 2012-08-24 DIAGNOSIS — Z23 Encounter for immunization: Secondary | ICD-10-CM

## 2012-08-30 ENCOUNTER — Other Ambulatory Visit: Payer: Self-pay | Admitting: *Deleted

## 2012-08-30 NOTE — Telephone Encounter (Signed)
Left message to call office. Called in regard to faxed from mail order need to confirm that Pt is using this pharmacy, not in Pt profile.

## 2012-08-31 MED ORDER — FENOFIBRATE 160 MG PO TABS: 160.0000 mg | ORAL_TABLET | Freq: Every day | ORAL | Status: DC

## 2012-08-31 NOTE — Telephone Encounter (Signed)
Discuss with patient, Rx sent. 

## 2012-08-31 NOTE — Addendum Note (Signed)
Addended byDuaine Dredge, Shandel Busic L on: 08/31/2012 11:10 AM   Modules accepted: Orders

## 2012-09-02 ENCOUNTER — Other Ambulatory Visit: Payer: Self-pay | Admitting: *Deleted

## 2012-09-02 MED ORDER — FENOFIBRATE 160 MG PO TABS: 160.0000 mg | ORAL_TABLET | Freq: Every day | ORAL | Status: DC

## 2012-11-05 ENCOUNTER — Encounter (HOSPITAL_COMMUNITY): Payer: Self-pay | Admitting: Pharmacy Technician

## 2012-11-08 ENCOUNTER — Other Ambulatory Visit (HOSPITAL_COMMUNITY): Payer: Self-pay | Admitting: Orthopaedic Surgery

## 2012-11-10 ENCOUNTER — Encounter (HOSPITAL_COMMUNITY): Payer: Self-pay

## 2012-11-10 ENCOUNTER — Encounter (HOSPITAL_COMMUNITY)
Admission: RE | Admit: 2012-11-10 | Discharge: 2012-11-10 | Disposition: A | Discharge status: Home / Self Care | Payer: Medicare Other | Source: Ambulatory Visit | Attending: Orthopaedic Surgery | Admitting: Orthopaedic Surgery

## 2012-11-10 DIAGNOSIS — I4891 Unspecified atrial fibrillation: Secondary | ICD-10-CM | POA: Diagnosis not present

## 2012-11-10 DIAGNOSIS — M19049 Primary osteoarthritis, unspecified hand: Secondary | ICD-10-CM | POA: Diagnosis not present

## 2012-11-10 DIAGNOSIS — Z01818 Encounter for other preprocedural examination: Secondary | ICD-10-CM | POA: Diagnosis not present

## 2012-11-10 DIAGNOSIS — M169 Osteoarthritis of hip, unspecified: Secondary | ICD-10-CM | POA: Diagnosis present

## 2012-11-10 DIAGNOSIS — Z471 Aftercare following joint replacement surgery: Secondary | ICD-10-CM | POA: Diagnosis not present

## 2012-11-10 DIAGNOSIS — E782 Mixed hyperlipidemia: Secondary | ICD-10-CM | POA: Diagnosis not present

## 2012-11-10 DIAGNOSIS — Z96649 Presence of unspecified artificial hip joint: Secondary | ICD-10-CM | POA: Diagnosis not present

## 2012-11-10 DIAGNOSIS — Z01812 Encounter for preprocedural laboratory examination: Secondary | ICD-10-CM | POA: Diagnosis not present

## 2012-11-10 DIAGNOSIS — F17200 Nicotine dependence, unspecified, uncomplicated: Secondary | ICD-10-CM | POA: Diagnosis not present

## 2012-11-10 DIAGNOSIS — M1610 Unilateral primary osteoarthritis, unspecified hip: Secondary | ICD-10-CM | POA: Diagnosis not present

## 2012-11-10 HISTORY — DX: Unspecified osteoarthritis, unspecified site: M19.90

## 2012-11-10 HISTORY — DX: Personal history of urinary calculi: Z87.442

## 2012-11-10 LAB — COMPREHENSIVE METABOLIC PANEL
ALT: 24 U/L (ref 0–53)
AST: 23 U/L (ref 0–37)
Albumin: 4.7 g/dL (ref 3.5–5.2)
Alkaline Phosphatase: 62 U/L (ref 39–117)
BUN: 18 mg/dL (ref 6–23)
CO2: 28 mEq/L (ref 19–32)
Calcium: 9.8 mg/dL (ref 8.4–10.5)
Chloride: 104 mEq/L (ref 96–112)
Creatinine, Ser: 1.38 mg/dL — ABNORMAL HIGH (ref 0.50–1.35)
GFR calc Af Amer: 60 mL/min — ABNORMAL LOW (ref >90)
GFR calc non Af Amer: 52 mL/min — ABNORMAL LOW (ref >90)
Glucose, Bld: 95 mg/dL (ref 70–99)
Potassium: 4.4 mEq/L (ref 3.5–5.1)
Sodium: 143 mEq/L (ref 135–145)
Total Bilirubin: 0.2 mg/dL — ABNORMAL LOW (ref 0.3–1.2)
Total Protein: 7.7 g/dL (ref 6.0–8.3)

## 2012-11-10 LAB — CBC
HCT: 41 % (ref 39.0–52.0)
Hemoglobin: 13.6 g/dL (ref 13.0–17.0)
MCH: 29.9 pg (ref 26.0–34.0)
MCHC: 33.2 g/dL (ref 30.0–36.0)
MCV: 90.1 fL (ref 78.0–100.0)
Platelets: 227 10*3/uL (ref 150–400)
RBC: 4.55 MIL/uL (ref 4.22–5.81)
RDW: 12.8 % (ref 11.5–15.5)
WBC: 8 10*3/uL (ref 4.0–10.5)

## 2012-11-10 LAB — URINALYSIS, ROUTINE W REFLEX MICROSCOPIC
Bilirubin Urine: NEGATIVE
Glucose, UA: NEGATIVE mg/dL
Ketones, ur: NEGATIVE mg/dL
Nitrite: NEGATIVE
Protein, ur: 30 mg/dL — AB
Specific Gravity, Urine: 1.019 (ref 1.005–1.030)
Urobilinogen, UA: 0.2 mg/dL (ref 0.0–1.0)
pH: 5.5 (ref 5.0–8.0)

## 2012-11-10 LAB — PROTIME-INR
INR: 0.96 (ref 0.00–1.49)
Prothrombin Time: 12.7 seconds (ref 11.6–15.2)

## 2012-11-10 LAB — URINE MICROSCOPIC-ADD ON

## 2012-11-10 LAB — APTT: aPTT: 30 seconds (ref 24–37)

## 2012-11-10 LAB — SURGICAL PCR SCREEN
MRSA, PCR: NEGATIVE
Staphylococcus aureus: POSITIVE — AB

## 2012-11-10 NOTE — Pre-Procedure Instructions (Signed)
[name]  11/10/2012   Your procedure is scheduled on:  Monday, Feb. 17th  Report to [facility] at 10:30 AM.  Call this number if you have problems the morning of surgery: [phone]   Remember:   Do not eat food or drink liquids after midnight Sunday.   Take these medicines the morning of surgery with A SIP OF WATER: none   Do not wear jewelry.  Do not wear lotions, powders, or colognes. You may NOT wear deodorant.   Men may shave face and neck.   Do not bring valuables to the hospital.  Contacts, dentures or bridgework may not be worn into surgery.   Leave suitcase in the car. After surgery it may be brought to your room.  For patients admitted to the hospital, checkout time is 11:00 AM the day of discharge.   Patients discharged the day of surgery will not be allowed to drive home.  Name and phone number of your driver:    Special Instructions: Shower using CHG 2 nights before surgery and the night before surgery.  If you shower the day of surgery use CHG.  Use special wash - you have one bottle of CHG for all showers.  You should use approximately 1/3 of the bottle for each shower.   Please read over the following fact sheets that you were given: Pain Booklet, Coughing and Deep Breathing, MRSA Information and Surgical Site Infection Prevention

## 2012-11-12 NOTE — H&P (Signed)
TOTAL HIP ADMISSION H&P  Patient is admitted for right total hip arthroplasty.  Subjective:  Chief Complaint: right hip pain  HPI: [name] Short, 67 y.o. male, has a history of pain and functional disability in the right hip(s) due to arthritis and patient has failed non-surgical conservative treatments for greater than 12 weeks to include NSAID's and/or analgesics, use of assistive devices and activity modification.  Onset of symptoms was gradual starting 3 years ago with gradually worsening course since that time.The patient noted no past surgery on the right hip(s).  Patient currently rates pain in the right hip at 7 out of 10 with activity. Patient has night pain, worsening of pain with activity and weight bearing, trendelenberg gait, pain that interfers with activities of daily living and pain with passive range of motion. Patient has evidence of subchondral cysts, subchondral sclerosis, periarticular osteophytes and joint space narrowing by imaging studies. This condition presents safety issues increasing the risk of falls. This patient has had THR of the left hip which has done well over the last 10 years..  There is no current active infection.  Patient Active Problem List   Diagnosis Date Noted  . Physical exam, annual 05/17/2012  . HERNIA 08/12/2010  . 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 TOBACCO USER 05/01/2010  . ATRIAL FIBRILLATION 05/01/2010  . ABNORMAL ELECTROCARDIOGRAM 03/26/2010   Past Medical History  Diagnosis Date  . Atrial fibrillation   . History of kidney stones     hasn't had any since the 1990's--had lithotripsey  . Arthritis     Past Surgical History  Procedure Laterality Date  . Joint replacement    . Tonsillectomy    . Hernia repair      bil inguinals  . Appendectomy      1983    Prescriptions prior to admission  Medication Sig Dispense Refill  . aspirin 325 MG EC tablet Take 325 mg by mouth daily.      . calcium carbonate (OS-CAL) 600 MG TABS  Take 600 mg by mouth daily.      . fenofibrate 160 MG tablet Take 160 mg by mouth daily.      Marland Kitchen ibuprofen (ADVIL,MOTRIN) 200 MG tablet Take 400 mg by mouth 3 (three) times daily as needed. For pain      . Multiple Vitamin (MULTIVITAMIN WITH MINERALS) TABS Take 1 tablet by mouth daily.      . naproxen sodium (ANAPROX) 220 MG tablet Take 440 mg by mouth 2 (two) times daily as needed. For pain      . Omega-3 Fatty Acids (FISH OIL) 1200 MG CAPS Take 1,200 mg by mouth daily.       Allergies  Allergen Reactions  . Warfarin Sodium Other (See Comments)    REACTION:  Unknown.    History  Substance Use Topics  . Smoking status: Current Every Day Smoker -- 10 years    Types: Cigars  . Smokeless tobacco: Not on file  . Alcohol Use: 1.2 oz/week    2 Cans of beer per week    History reviewed. No pertinent family history.   Review of Systems  Musculoskeletal: Positive for joint pain.       Right hip pain  All other systems reviewed and are negative.    Objective:  Physical Exam  Constitutional: He is oriented to person, place, and time. He appears well-developed and well-nourished.  HENT:  Head: Normocephalic and atraumatic.  Eyes: EOM are normal. Pupils are equal, round, and  reactive to light.  Neck: Normal range of motion. Neck supple.  Cardiovascular: Normal rate, regular rhythm and intact distal pulses.   Respiratory: Effort normal and breath sounds normal.  GI: Soft.  Musculoskeletal:  Decreased and painful ROM of right hip.  No contracture.  Trendelenburg gait with ambulation.  FROM right knee Left hip incision well healed. No pain with ROM left hip  Neurological: He is alert and oriented to person, place, and time.  Skin: Skin is warm and dry.  Psychiatric: He has a normal mood and affect.    Vital signs in last 24 hours: Temp:  [97.7 F (36.5 C)] 97.7 F (36.5 C) (02/17 1108) Pulse Rate:  [102] 102 (02/17 1108) Resp:  [18] 18 (02/17 1108) BP: (132)/(93) 132/93 mmHg  (02/17 1108) SpO2:  [97 %] 97 % (02/17 1108)  Labs:   Estimated body mass index is 29.55 kg/(m^2) as calculated from the following:   Height as of 11/10/12: 5\' 6"  (1.676 m).   Weight as of 05/17/12: 83.008 kg (183 lb).   Imaging Review Plain radiographs demonstrate severe degenerative joint disease of the right hip(s). The bone quality appears to be adequate for age and reported activity level.  Assessment/Plan:  End stage arthritis, right hip(s)  The patient history, physical examination, clinical judgement of the provider and imaging studies are consistent with end stage degenerative joint disease of the right hip(s) and total hip arthroplasty is deemed medically necessary. The treatment options including medical management, injection therapy, arthroscopy and arthroplasty were discussed at length. The risks and benefits of total hip arthroplasty were presented and reviewed. The risks due to aseptic loosening, infection, stiffness, dislocation/subluxation,  thromboembolic complications and other imponderables were discussed.  The patient acknowledged the explanation, agreed to proceed with the plan and consent was signed. Patient is being admitted for inpatient treatment for surgery, pain control, PT, OT, prophylactic antibiotics, VTE prophylaxis, progressive ambulation and ADL's and discharge planning.The patient is planning to be discharged home with home health services

## 2012-11-14 MED ORDER — CEFAZOLIN SODIUM-DEXTROSE 2-3 GM-% IV SOLR
2.0000 g | INTRAVENOUS | Status: AC
  Administered 2012-11-15: 2 g via INTRAVENOUS
  Filled 2012-11-14: qty 50

## 2012-11-15 ENCOUNTER — Inpatient Hospital Stay (HOSPITAL_COMMUNITY)
Admission: RE | Admit: 2012-11-15 | Discharge: 2012-11-18 | DRG: 470 | Disposition: A | Discharge status: Home Health | Payer: Medicare Other | Source: Ambulatory Visit | Attending: Orthopaedic Surgery | Admitting: Orthopaedic Surgery

## 2012-11-15 ENCOUNTER — Inpatient Hospital Stay (HOSPITAL_COMMUNITY): Payer: Medicare Other | Admitting: Anesthesiology

## 2012-11-15 ENCOUNTER — Encounter (HOSPITAL_COMMUNITY)
Admission: RE | Disposition: A | Discharge status: Home Health | Payer: Self-pay | Source: Ambulatory Visit | Attending: Orthopaedic Surgery

## 2012-11-15 ENCOUNTER — Encounter (HOSPITAL_COMMUNITY): Payer: Self-pay | Admitting: *Deleted

## 2012-11-15 ENCOUNTER — Inpatient Hospital Stay (HOSPITAL_COMMUNITY): Payer: Medicare Other

## 2012-11-15 ENCOUNTER — Encounter (HOSPITAL_COMMUNITY): Payer: Self-pay | Admitting: Anesthesiology

## 2012-11-15 DIAGNOSIS — I4891 Unspecified atrial fibrillation: Secondary | ICD-10-CM | POA: Diagnosis present

## 2012-11-15 DIAGNOSIS — M169 Osteoarthritis of hip, unspecified: Principal | ICD-10-CM | POA: Diagnosis present

## 2012-11-15 DIAGNOSIS — E782 Mixed hyperlipidemia: Secondary | ICD-10-CM | POA: Diagnosis present

## 2012-11-15 DIAGNOSIS — M1610 Unilateral primary osteoarthritis, unspecified hip: Principal | ICD-10-CM | POA: Diagnosis present

## 2012-11-15 DIAGNOSIS — Z01812 Encounter for preprocedural laboratory examination: Secondary | ICD-10-CM

## 2012-11-15 DIAGNOSIS — F17200 Nicotine dependence, unspecified, uncomplicated: Secondary | ICD-10-CM | POA: Diagnosis present

## 2012-11-15 DIAGNOSIS — M1611 Unilateral primary osteoarthritis, right hip: Secondary | ICD-10-CM | POA: Diagnosis present

## 2012-11-15 HISTORY — PX: TOTAL HIP ARTHROPLASTY: SHX124

## 2012-11-15 LAB — URINALYSIS, MICROSCOPIC ONLY
Bilirubin Urine: NEGATIVE
Glucose, UA: NEGATIVE mg/dL
Hgb urine dipstick: NEGATIVE
Ketones, ur: NEGATIVE mg/dL
Nitrite: NEGATIVE
Protein, ur: 30 mg/dL — AB
Specific Gravity, Urine: 1.017 (ref 1.005–1.030)
Urobilinogen, UA: 0.2 mg/dL (ref 0.0–1.0)
pH: 6 (ref 5.0–8.0)

## 2012-11-15 LAB — TYPE AND SCREEN
ABO/RH(D): O POS
Antibody Screen: NEGATIVE

## 2012-11-15 LAB — ABO/RH: ABO/RH(D): O POS

## 2012-11-15 SURGERY — ARTHROPLASTY, HIP, TOTAL, ANTERIOR APPROACH
Anesthesia: General | Site: Hip | Laterality: Right | Wound class: Clean

## 2012-11-15 MED ORDER — ADULT MULTIVITAMIN W/MINERALS CH
1.0000 | ORAL_TABLET | Freq: Every day | ORAL | Status: DC
  Administered 2012-11-15 – 2012-11-18 (×4): 1 via ORAL
  Filled 2012-11-15 (×4): qty 1

## 2012-11-15 MED ORDER — CALCIUM CARBONATE 1250 (500 CA) MG PO TABS
1.0000 | ORAL_TABLET | Freq: Every day | ORAL | Status: DC
  Administered 2012-11-16 – 2012-11-18 (×3): 500 mg via ORAL
  Filled 2012-11-15 (×5): qty 1

## 2012-11-15 MED ORDER — ACETAMINOPHEN 325 MG PO TABS: 650.0000 mg | ORAL_TABLET | Freq: Four times a day (QID) | ORAL | Status: DC | PRN

## 2012-11-15 MED ORDER — METOCLOPRAMIDE HCL 10 MG PO TABS: 5.0000 mg | ORAL_TABLET | Freq: Three times a day (TID) | ORAL | Status: DC | PRN

## 2012-11-15 MED ORDER — SODIUM CHLORIDE 0.9 % IJ SOLN: 9.0000 mL | INTRAMUSCULAR | Status: DC | PRN

## 2012-11-15 MED ORDER — MORPHINE SULFATE (PF) 1 MG/ML IV SOLN
INTRAVENOUS | Status: AC
  Filled 2012-11-15: qty 25

## 2012-11-15 MED ORDER — LIDOCAINE HCL (CARDIAC) 20 MG/ML IV SOLN
INTRAVENOUS | Status: DC | PRN
  Administered 2012-11-15: 100 mg via INTRAVENOUS

## 2012-11-15 MED ORDER — ONDANSETRON HCL 4 MG PO TABS: 4.0000 mg | ORAL_TABLET | Freq: Four times a day (QID) | ORAL | Status: DC | PRN

## 2012-11-15 MED ORDER — ONDANSETRON HCL 4 MG/2ML IJ SOLN
4.0000 mg | Freq: Four times a day (QID) | INTRAMUSCULAR | Status: DC | PRN
  Filled 2012-11-15: qty 2

## 2012-11-15 MED ORDER — ZOLPIDEM TARTRATE 5 MG PO TABS: 5.0000 mg | ORAL_TABLET | Freq: Every evening | ORAL | Status: DC | PRN

## 2012-11-15 MED ORDER — GLYCOPYRROLATE 0.2 MG/ML IJ SOLN
INTRAMUSCULAR | Status: DC | PRN
  Administered 2012-11-15: 0.6 mg via INTRAVENOUS

## 2012-11-15 MED ORDER — ROCURONIUM BROMIDE 100 MG/10ML IV SOLN
INTRAVENOUS | Status: DC | PRN
  Administered 2012-11-15: 50 mg via INTRAVENOUS

## 2012-11-15 MED ORDER — ONDANSETRON HCL 4 MG/2ML IJ SOLN
INTRAMUSCULAR | Status: DC | PRN
  Administered 2012-11-15: 4 mg via INTRAVENOUS

## 2012-11-15 MED ORDER — MORPHINE SULFATE (PF) 1 MG/ML IV SOLN
INTRAVENOUS | Status: DC
  Administered 2012-11-15: 2 mg via INTRAVENOUS
  Administered 2012-11-15: 19 mg via INTRAVENOUS
  Administered 2012-11-15: 23 mL via INTRAVENOUS
  Administered 2012-11-16: 8 mg via INTRAVENOUS
  Administered 2012-11-16: 2 mg via INTRAVENOUS
  Administered 2012-11-16: 12 mg via INTRAVENOUS
  Administered 2012-11-16: 11 mg via INTRAVENOUS
  Administered 2012-11-16: 12:00:00 via INTRAVENOUS
  Filled 2012-11-15 (×2): qty 25

## 2012-11-15 MED ORDER — SENNOSIDES-DOCUSATE SODIUM 8.6-50 MG PO TABS: 1.0000 | ORAL_TABLET | Freq: Every evening | ORAL | Status: DC | PRN

## 2012-11-15 MED ORDER — PHENOL 1.4 % MT LIQD: 1.0000 | OROMUCOSAL | Status: DC | PRN

## 2012-11-15 MED ORDER — CEFAZOLIN SODIUM 1-5 GM-% IV SOLN
1.0000 g | Freq: Four times a day (QID) | INTRAVENOUS | Status: AC
  Administered 2012-11-15 – 2012-11-16 (×2): 1 g via INTRAVENOUS
  Filled 2012-11-15 (×2): qty 50

## 2012-11-15 MED ORDER — CALCIUM CARBONATE 600 MG PO TABS
600.0000 mg | ORAL_TABLET | Freq: Every day | ORAL | Status: DC
  Filled 2012-11-15: qty 1

## 2012-11-15 MED ORDER — NALOXONE HCL 0.4 MG/ML IJ SOLN: 0.4000 mg | INTRAMUSCULAR | Status: DC | PRN

## 2012-11-15 MED ORDER — NEOSTIGMINE METHYLSULFATE 1 MG/ML IJ SOLN
INTRAMUSCULAR | Status: DC | PRN
  Administered 2012-11-15: 4 mg via INTRAVENOUS

## 2012-11-15 MED ORDER — OXYCODONE HCL 5 MG PO TABS
ORAL_TABLET | ORAL | Status: AC
  Filled 2012-11-15: qty 1

## 2012-11-15 MED ORDER — BISACODYL 10 MG RE SUPP: 10.0000 mg | Freq: Every day | RECTAL | Status: DC | PRN

## 2012-11-15 MED ORDER — KCL IN DEXTROSE-NACL 20-5-0.45 MEQ/L-%-% IV SOLN
INTRAVENOUS | Status: DC
  Administered 2012-11-15 – 2012-11-17 (×3): via INTRAVENOUS
  Filled 2012-11-15 (×8): qty 1000

## 2012-11-15 MED ORDER — MEPERIDINE HCL 25 MG/ML IJ SOLN: 6.2500 mg | INTRAMUSCULAR | Status: DC | PRN

## 2012-11-15 MED ORDER — ASPIRIN EC 325 MG PO TBEC: 325.0000 mg | DELAYED_RELEASE_TABLET | Freq: Every day | ORAL | Status: DC

## 2012-11-15 MED ORDER — OMEGA-3-ACID ETHYL ESTERS 1 G PO CAPS
1.0000 g | ORAL_CAPSULE | Freq: Two times a day (BID) | ORAL | Status: DC
  Administered 2012-11-15 – 2012-11-18 (×6): 1 g via ORAL
  Filled 2012-11-15 (×7): qty 1

## 2012-11-15 MED ORDER — METHOCARBAMOL 500 MG PO TABS
500.0000 mg | ORAL_TABLET | Freq: Four times a day (QID) | ORAL | Status: DC | PRN
  Administered 2012-11-15 – 2012-11-18 (×5): 500 mg via ORAL
  Filled 2012-11-15 (×4): qty 1

## 2012-11-15 MED ORDER — LACTATED RINGERS IV SOLN: INTRAVENOUS | Status: DC

## 2012-11-15 MED ORDER — HYDROMORPHONE HCL PF 1 MG/ML IJ SOLN
INTRAMUSCULAR | Status: AC
  Filled 2012-11-15: qty 1

## 2012-11-15 MED ORDER — LACTATED RINGERS IV SOLN
INTRAVENOUS | Status: DC
  Administered 2012-11-15 (×2): via INTRAVENOUS

## 2012-11-15 MED ORDER — METOCLOPRAMIDE HCL 5 MG/ML IJ SOLN
5.0000 mg | Freq: Three times a day (TID) | INTRAMUSCULAR | Status: DC | PRN
Start: 2012-11-15 — End: 2012-11-18

## 2012-11-15 MED ORDER — DIPHENHYDRAMINE HCL 50 MG/ML IJ SOLN: 12.5000 mg | Freq: Four times a day (QID) | INTRAMUSCULAR | Status: DC | PRN

## 2012-11-15 MED ORDER — OXYCODONE HCL 5 MG/5ML PO SOLN: 5.0000 mg | Freq: Once | ORAL | Status: AC | PRN

## 2012-11-15 MED ORDER — DOCUSATE SODIUM 100 MG PO CAPS
100.0000 mg | ORAL_CAPSULE | Freq: Two times a day (BID) | ORAL | Status: DC
  Administered 2012-11-15 – 2012-11-18 (×6): 100 mg via ORAL
  Filled 2012-11-15 (×7): qty 1

## 2012-11-15 MED ORDER — METHOCARBAMOL 100 MG/ML IJ SOLN
500.0000 mg | Freq: Four times a day (QID) | INTRAVENOUS | Status: DC | PRN
  Filled 2012-11-15: qty 5

## 2012-11-15 MED ORDER — MIDAZOLAM HCL 5 MG/5ML IJ SOLN
INTRAMUSCULAR | Status: DC | PRN
  Administered 2012-11-15: 2 mg via INTRAVENOUS

## 2012-11-15 MED ORDER — FENTANYL CITRATE 0.05 MG/ML IJ SOLN
INTRAMUSCULAR | Status: DC | PRN
  Administered 2012-11-15: 100 ug via INTRAVENOUS
  Administered 2012-11-15: 150 ug via INTRAVENOUS

## 2012-11-15 MED ORDER — HYDROMORPHONE HCL PF 1 MG/ML IJ SOLN
0.2500 mg | INTRAMUSCULAR | Status: DC | PRN
  Administered 2012-11-15 (×4): 0.5 mg via INTRAVENOUS

## 2012-11-15 MED ORDER — METHOCARBAMOL 500 MG PO TABS
ORAL_TABLET | ORAL | Status: AC
  Filled 2012-11-15: qty 1

## 2012-11-15 MED ORDER — FENOFIBRATE 160 MG PO TABS
160.0000 mg | ORAL_TABLET | Freq: Every day | ORAL | Status: DC
  Administered 2012-11-15 – 2012-11-18 (×4): 160 mg via ORAL
  Filled 2012-11-15 (×4): qty 1

## 2012-11-15 MED ORDER — DIPHENHYDRAMINE HCL 12.5 MG/5ML PO ELIX: 12.5000 mg | ORAL_SOLUTION | ORAL | Status: DC | PRN

## 2012-11-15 MED ORDER — FLEET ENEMA 7-19 GM/118ML RE ENEM: 1.0000 | ENEMA | Freq: Once | RECTAL | Status: AC | PRN

## 2012-11-15 MED ORDER — ASPIRIN EC 325 MG PO TBEC
325.0000 mg | DELAYED_RELEASE_TABLET | Freq: Every day | ORAL | Status: DC
  Administered 2012-11-16 – 2012-11-18 (×3): 325 mg via ORAL
  Filled 2012-11-15 (×5): qty 1

## 2012-11-15 MED ORDER — ONDANSETRON HCL 4 MG/2ML IJ SOLN: 4.0000 mg | Freq: Once | INTRAMUSCULAR | Status: DC | PRN

## 2012-11-15 MED ORDER — ONDANSETRON HCL 4 MG/2ML IJ SOLN
4.0000 mg | Freq: Four times a day (QID) | INTRAMUSCULAR | Status: DC | PRN
  Administered 2012-11-16: 4 mg via INTRAVENOUS

## 2012-11-15 MED ORDER — VECURONIUM BROMIDE 10 MG IV SOLR
INTRAVENOUS | Status: DC | PRN
  Administered 2012-11-15 (×3): 2 mg via INTRAVENOUS

## 2012-11-15 MED ORDER — KETOROLAC TROMETHAMINE 30 MG/ML IJ SOLN
INTRAMUSCULAR | Status: AC
  Filled 2012-11-15: qty 1

## 2012-11-15 MED ORDER — DIPHENHYDRAMINE HCL 12.5 MG/5ML PO ELIX: 12.5000 mg | ORAL_SOLUTION | Freq: Four times a day (QID) | ORAL | Status: DC | PRN

## 2012-11-15 MED ORDER — 0.9 % SODIUM CHLORIDE (POUR BTL) OPTIME
TOPICAL | Status: DC | PRN
  Administered 2012-11-15: 1000 mL

## 2012-11-15 MED ORDER — OXYCODONE HCL 5 MG PO TABS
5.0000 mg | ORAL_TABLET | ORAL | Status: DC | PRN
  Administered 2012-11-16 – 2012-11-18 (×15): 10 mg via ORAL
  Filled 2012-11-15 (×15): qty 2

## 2012-11-15 MED ORDER — ACETAMINOPHEN 650 MG RE SUPP: 650.0000 mg | Freq: Four times a day (QID) | RECTAL | Status: DC | PRN

## 2012-11-15 MED ORDER — OXYCODONE HCL 5 MG PO TABS
5.0000 mg | ORAL_TABLET | Freq: Once | ORAL | Status: AC | PRN
  Administered 2012-11-15: 5 mg via ORAL

## 2012-11-15 MED ORDER — MENTHOL 3 MG MT LOZG: 1.0000 | LOZENGE | OROMUCOSAL | Status: DC | PRN

## 2012-11-15 MED ORDER — PROPOFOL 10 MG/ML IV BOLUS
INTRAVENOUS | Status: DC | PRN
  Administered 2012-11-15: 200 mg via INTRAVENOUS

## 2012-11-15 MED ORDER — KETOROLAC TROMETHAMINE 15 MG/ML IJ SOLN
15.0000 mg | Freq: Three times a day (TID) | INTRAMUSCULAR | Status: AC
  Administered 2012-11-15 – 2012-11-16 (×3): 15 mg via INTRAVENOUS
  Filled 2012-11-15 (×5): qty 1

## 2012-11-15 SURGICAL SUPPLY — 55 items
ADH SKN CLS APL DERMABOND .7 (GAUZE/BANDAGES/DRESSINGS) ×1
BLADE SAW SGTL 18X1.27X75 (BLADE) ×2 IMPLANT
BLADE SURG ROTATE 9660 (MISCELLANEOUS) ×1 IMPLANT
CELLS DAT CNTRL 66122 CELL SVR (MISCELLANEOUS) ×1 IMPLANT
CLOTH BEACON ORANGE TIMEOUT ST (SAFETY) ×2 IMPLANT
COVER SURGICAL LIGHT HANDLE (MISCELLANEOUS) ×2 IMPLANT
DERMABOND ADVANCED (GAUZE/BANDAGES/DRESSINGS) ×1
DERMABOND ADVANCED .7 DNX12 (GAUZE/BANDAGES/DRESSINGS) IMPLANT
DRAPE C-ARM 42X72 X-RAY (DRAPES) ×2 IMPLANT
DRAPE STERI IOBAN 125X83 (DRAPES) ×2 IMPLANT
DRAPE U-SHAPE 47X51 STRL (DRAPES) ×6 IMPLANT
DRSG MEPILEX BORDER 4X8 (GAUZE/BANDAGES/DRESSINGS) ×2 IMPLANT
DURAPREP 26ML APPLICATOR (WOUND CARE) ×2 IMPLANT
ELECT BLADE 4.0 EZ CLEAN MEGAD (MISCELLANEOUS) ×2
ELECT BLADE TIP CTD 4 INCH (ELECTRODE) ×2 IMPLANT
ELECT CAUTERY BLADE 6.4 (BLADE) ×2 IMPLANT
ELECT REM PT RETURN 9FT ADLT (ELECTROSURGICAL) ×2
ELECTRODE BLDE 4.0 EZ CLN MEGD (MISCELLANEOUS) IMPLANT
ELECTRODE REM PT RTRN 9FT ADLT (ELECTROSURGICAL) ×1 IMPLANT
FACESHIELD LNG OPTICON STERILE (SAFETY) ×4 IMPLANT
GAUZE XEROFORM 1X8 LF (GAUZE/BANDAGES/DRESSINGS) ×2 IMPLANT
GLOVE BIOGEL PI IND STRL 7.5 (GLOVE) ×1 IMPLANT
GLOVE BIOGEL PI IND STRL 8 (GLOVE) ×1 IMPLANT
GLOVE BIOGEL PI INDICATOR 7.5 (GLOVE) ×1
GLOVE BIOGEL PI INDICATOR 8 (GLOVE) ×1
GLOVE ECLIPSE 7.0 STRL STRAW (GLOVE) ×2 IMPLANT
GLOVE ORTHO TXT STRL SZ7.5 (GLOVE) ×2 IMPLANT
GOWN PREVENTION PLUS LG XLONG (DISPOSABLE) ×1 IMPLANT
GOWN STRL NON-REIN LRG LVL3 (GOWN DISPOSABLE) ×4 IMPLANT
GOWN STRL REIN XL XLG (GOWN DISPOSABLE) ×2 IMPLANT
KIT BASIN OR (CUSTOM PROCEDURE TRAY) ×2 IMPLANT
KIT ROOM TURNOVER OR (KITS) ×2 IMPLANT
MANIFOLD NEPTUNE II (INSTRUMENTS) ×2 IMPLANT
NS IRRIG 1000ML POUR BTL (IV SOLUTION) ×2 IMPLANT
PACK TOTAL JOINT (CUSTOM PROCEDURE TRAY) ×2 IMPLANT
PAD ARMBOARD 7.5X6 YLW CONV (MISCELLANEOUS) ×4 IMPLANT
RETRACTOR WND ALEXIS 18 MED (MISCELLANEOUS) ×1 IMPLANT
RTRCTR WOUND ALEXIS 18CM MED (MISCELLANEOUS) ×2
SPONGE LAP 18X18 X RAY DECT (DISPOSABLE) ×2 IMPLANT
SPONGE LAP 4X18 X RAY DECT (DISPOSABLE) ×1 IMPLANT
STAPLER VISISTAT 35W (STAPLE) ×2 IMPLANT
SUCTION FRAZIER TIP 10 FR DISP (SUCTIONS) ×1 IMPLANT
SUT ETHIBOND NAB CT1 #1 30IN (SUTURE) ×3 IMPLANT
SUT VIC AB 0 CT1 27 (SUTURE) ×2
SUT VIC AB 0 CT1 27XBRD ANBCTR (SUTURE) ×2 IMPLANT
SUT VIC AB 1 CT1 27 (SUTURE) ×2
SUT VIC AB 1 CT1 27XBRD ANBCTR (SUTURE) ×2 IMPLANT
SUT VIC AB 2-0 CT1 27 (SUTURE) ×2
SUT VIC AB 2-0 CT1 TAPERPNT 27 (SUTURE) ×2 IMPLANT
SUT VICRYL 4-0 PS2 18IN ABS (SUTURE) ×1 IMPLANT
SUT VLOC 180 0 24IN GS25 (SUTURE) ×1 IMPLANT
TOWEL OR 17X24 6PK STRL BLUE (TOWEL DISPOSABLE) ×2 IMPLANT
TOWEL OR 17X26 10 PK STRL BLUE (TOWEL DISPOSABLE) ×4 IMPLANT
TRAY FOLEY CATH 14FR (SET/KITS/TRAYS/PACK) ×1 IMPLANT
WATER STERILE IRR 1000ML POUR (IV SOLUTION) ×4 IMPLANT

## 2012-11-15 NOTE — Transfer of Care (Signed)
Immediate Anesthesia Transfer of Care Note  Patient: [name] Short  Procedure(s) Performed: Procedure(s) with comments: RIGHT TOTAL HIP ARTHROPLASTY ANTERIOR APPROACH (Right) - Right total hip arthroplasty  Patient Location: PACU  Anesthesia Type:General  Level of Consciousness: awake, oriented and patient cooperative  Airway & Oxygen Therapy: Patient Spontanous Breathing and Patient connected to nasal cannula oxygen  Post-op Assessment: Report given to PACU RN and Post -op Vital signs reviewed and stable  Post vital signs: Reviewed and stable  Complications: No apparent anesthesia complications

## 2012-11-15 NOTE — Anesthesia Procedure Notes (Signed)
Procedure Name: Intubation Date/Time: 11/15/2012 1:00 PM Performed by: Marena Chancy Pre-anesthesia Checklist: Patient identified, Timeout performed, Emergency Drugs available, Suction available and Patient being monitored Patient Re-evaluated:Patient Re-evaluated prior to inductionOxygen Delivery Method: Circle system utilized Preoxygenation: Pre-oxygenation with 100% oxygen Intubation Type: IV induction Ventilation: Mask ventilation without difficulty Grade View: Grade IV Tube type: Oral Tube size: 7.5 mm Number of attempts: 3 Airway Equipment and Method: Video-laryngoscopy Placement Confirmation: positive ETCO2,  CO2 detector and breath sounds checked- equal and bilateral Secured at: 23 cm Tube secured with: Tape Dental Injury: Teeth and Oropharynx as per pre-operative assessment  Difficulty Due To: Difficulty was unanticipated Future Recommendations: Recommend- induction with short-acting agent, and alternative techniques readily available

## 2012-11-15 NOTE — Interval H&P Note (Signed)
History and Physical Interval Note:  11/15/2012 12:16 PM  [name] Short  has presented today for surgery, with the diagnosis of Right hip osteoarthritis  The various methods of treatment have been discussed with the patient and family. After consideration of risks, benefits and other options for treatment, the patient has consented to  Procedure(s) with comments: RIGHT TOTAL HIP ARTHROPLASTY ANTERIOR APPROACH (Right) - Right total hip arthroplasty as a surgical intervention .  The patient's history has been reviewed, patient examined, no change in status, stable for surgery.  I have reviewed the patient's chart and labs.  Questions were answered to the patient's satisfaction.     Azavier Creson C

## 2012-11-15 NOTE — Brief Op Note (Cosign Needed)
11/15/2012  3:32 PM  PATIENT:  [name] Short  67 y.o. male  PRE-OPERATIVE DIAGNOSIS:  Right hip osteoarthritis  POST-OPERATIVE DIAGNOSIS:  Right hip osteoarthritis  PROCEDURE:  Procedure(s) with comments: RIGHT TOTAL HIP ARTHROPLASTY ANTERIOR APPROACH (Right) - Right total hip arthroplasty  SURGEON:  Surgeon(s) and Role:    * Eldred Manges, MD - Primary  PHYSICIAN ASSISTANT: Maud Deed PAC  ASSISTANTS: none   ANESTHESIA:   general  EBL:  Total I/O In: 1000 [I.V.:1000] Out: 700 [Urine:300; Blood:400]  BLOOD ADMINISTERED:none  DRAINS: none   LOCAL MEDICATIONS USED:  NONE  SPECIMEN:  No Specimen  DISPOSITION OF SPECIMEN:  N/A  COUNTS:  YES  TOURNIQUET:  * No tourniquets in log *  DICTATION: .Note written in EPIC  PLAN OF CARE: Admit to inpatient   PATIENT DISPOSITION:  PACU - hemodynamically stable.   Delay start of Pharmacological VTE agent (>24hrs) due to surgical blood loss or risk of bleeding: no

## 2012-11-15 NOTE — Anesthesia Preprocedure Evaluation (Addendum)
Anesthesia Evaluation  Patient identified by MRN, date of birth, ID band Patient awake, Patient confused and Patient unresponsive    Reviewed: Allergy & Precautions, H&P , NPO status , Patient's Chart, lab work & pertinent test results  Airway Mallampati: II TM Distance: >3 FB Neck ROM: Full    Dental  (+) Teeth Intact   Pulmonary          Cardiovascular + dysrhythmias Atrial Fibrillation     Neuro/Psych    GI/Hepatic   Endo/Other    Renal/GU      Musculoskeletal   Abdominal   Peds  Hematology   Anesthesia Other Findings   Reproductive/Obstetrics                          Anesthesia Physical Anesthesia Plan  ASA: II  Anesthesia Plan: General   Post-op Pain Management:    Induction: Intravenous  Airway Management Planned: Oral ETT  Additional Equipment:   Intra-op Plan:   Post-operative Plan: Extubation in OR  Informed Consent: I have reviewed the patients History and Physical, chart, labs and discussed the procedure including the risks, benefits and alternatives for the proposed anesthesia with the patient or authorized representative who has indicated his/her understanding and acceptance.   Dental advisory given  Plan Discussed with: CRNA and Surgeon  Anesthesia Plan Comments:        Anesthesia Quick Evaluation

## 2012-11-15 NOTE — Preoperative (Signed)
Beta Blockers   Reason not to administer Beta Blockers:Not Applicable 

## 2012-11-16 LAB — CBC
HCT: 32 % — ABNORMAL LOW (ref 39.0–52.0)
Hemoglobin: 10.5 g/dL — ABNORMAL LOW (ref 13.0–17.0)
MCH: 29.5 pg (ref 26.0–34.0)
MCHC: 32.8 g/dL (ref 30.0–36.0)
MCV: 89.9 fL (ref 78.0–100.0)
Platelets: 227 10*3/uL (ref 150–400)
RBC: 3.56 MIL/uL — ABNORMAL LOW (ref 4.22–5.81)
RDW: 12.8 % (ref 11.5–15.5)
WBC: 12.4 10*3/uL — ABNORMAL HIGH (ref 4.0–10.5)

## 2012-11-16 LAB — BASIC METABOLIC PANEL
BUN: 23 mg/dL (ref 6–23)
CO2: 25 mEq/L (ref 19–32)
Calcium: 8.8 mg/dL (ref 8.4–10.5)
Chloride: 102 mEq/L (ref 96–112)
Creatinine, Ser: 1.21 mg/dL (ref 0.50–1.35)
GFR calc Af Amer: 71 mL/min — ABNORMAL LOW (ref >90)
GFR calc non Af Amer: 61 mL/min — ABNORMAL LOW (ref >90)
Glucose, Bld: 142 mg/dL — ABNORMAL HIGH (ref 70–99)
Potassium: 4.3 mEq/L (ref 3.5–5.1)
Sodium: 138 mEq/L (ref 135–145)

## 2012-11-16 NOTE — Progress Notes (Signed)
CARE MANAGEMENT NOTE 11/16/2012  Patient:  Short,[name]   Account Number:  [id number]  Date Initiated:  11/16/2012  Documentation initiated by:  Vance Peper  Subjective/Objective Assessment:   67 yr old male s/p right total hip arthroplasty.     Action/Plan:   CM spoke with patient and wife concerning DME needs at discharge.   Anticipated DC Date:  11/17/2012   Anticipated DC Plan:  HOME W HOME HEALTH SERVICES      DC Planning Services  CM consult      [facility] Choice  DURABLE MEDICAL EQUIPMENT   Choice offered to / List presented to:     DME arranged  3-N-1  Levan Hurst      DME agency  [facility]        Status of service:  In process, will continue to follow Medicare Important Message given?   (If response is "NO", the following Medicare IM given date fields will be blank) Date Medicare IM given:   Date Additional Medicare IM given:    Discharge Disposition:    Per UR Regulation:    If discussed at Long Length of Stay Meetings, dates discussed:    Comments:

## 2012-11-16 NOTE — Evaluation (Signed)
Physical Therapy Evaluation Patient Details Name: [name] Short MRN: [id number] DOB: June 28, 1946 Today's Date: 11/16/2012 Time: 4098-1191 PT Time Calculation (min): 43 min  PT Assessment / Plan / Recommendation Clinical Impression  pt rpesents with R THA.  Per Dr Ophelia Charter pt is to have Anterior Hip Precautions despite order stating Direct Anterior and No Hip Precautions.  pt ed on Anterior precautions and cues to follow during mobility.  pt very motivated to improve mobility and return home.      PT Assessment  Patient needs continued PT services    Follow Up Recommendations  Home health PT;Supervision - Intermittent    Does the patient have the potential to tolerate intense rehabilitation      Barriers to Discharge None      Equipment Recommendations  Rolling walker with 5" wheels    Recommendations for Other Services     Frequency 7X/week    Precautions / Restrictions Precautions Precautions: Anterior Hip;Fall Precaution Booklet Issued: Yes (comment) Precaution Comments: Order states Direct Anterior No Hip Precautions, however Dr Ophelia Charter told this PT while in pt's room that he wanted Anterior Hip Precautions.  RN notified.   Restrictions Weight Bearing Restrictions: Yes RLE Weight Bearing: Weight bearing as tolerated   Pertinent Vitals/Pain Pt indicates pain is minimal.        Mobility  Bed Mobility Bed Mobility: Supine to Sit;Sitting - Scoot to Edge of Bed Supine to Sit: 4: Min assist Sitting - Scoot to Edge of Bed: 5: Supervision Details for Bed Mobility Assistance: A with R LE only.  cues for sequencing.   Transfers Transfers: Sit to Stand;Stand to Sit Sit to Stand: 4: Min assist;With upper extremity assist;From bed Stand to Sit: 4: Min guard;With upper extremity assist;To chair/3-in-1;With armrests Details for Transfer Assistance: cues for UE use, controlling descent to chair.   Ambulation/Gait Ambulation/Gait Assistance: 4: Min guard Ambulation Distance  (Feet): 220 Feet Assistive device: Rolling walker Ambulation/Gait Assistance Details: Cues for use of RW, positioning in RW,  pt moving well, but at end of ambulation pt vomited.  RN aware.   Gait Pattern: Step-to pattern;Decreased step length - left;Decreased stance time - right Stairs: No Wheelchair Mobility Wheelchair Mobility: No    Exercises     PT Diagnosis: Abnormality of gait;Acute pain  PT Problem List: Decreased strength;Decreased activity tolerance;Decreased balance;Decreased mobility;Decreased knowledge of use of DME;Decreased knowledge of precautions;Pain PT Treatment Interventions: DME instruction;Gait training;Stair training;Functional mobility training;Therapeutic activities;Therapeutic exercise;Balance training;Patient/family education   PT Goals Acute Rehab PT Goals PT Goal Formulation: With patient Time For Goal Achievement: 11/23/12 Potential to Achieve Goals: Good Pt will go Supine/Side to Sit: with modified independence PT Goal: Supine/Side to Sit - Progress: Goal set today Pt will go Sit to Supine/Side: with modified independence PT Goal: Sit to Supine/Side - Progress: Goal set today Pt will go Sit to Stand: with modified independence PT Goal: Sit to Stand - Progress: Goal set today Pt will go Stand to Sit: with modified independence PT Goal: Stand to Sit - Progress: Goal set today Pt will Ambulate: >150 feet;with modified independence;with rolling walker PT Goal: Ambulate - Progress: Goal set today Pt will Go Up / Down Stairs: 3-5 stairs;with min assist;with rolling walker PT Goal: Up/Down Stairs - Progress: Goal set today Pt will Perform Home Exercise Program: with supervision, verbal cues required/provided PT Goal: Perform Home Exercise Program - Progress: Goal set today Additional Goals Additional Goal #1: pt will verbalize and follow anterior hip precautions.   PT Goal: Additional Goal #  1 - Progress: Goal set today  Visit Information  Last PT Received  On: 11/16/12 Assistance Needed: +1    Subjective Data  Subjective: It feels about like it did before surgery.   Patient Stated Goal: Tennis   Prior Functioning  Home Living Lives With: Spouse Available Help at Discharge: Family;Available 24 hours/day Type of Home: House Home Access: Stairs to enter [employer] of Steps: 1, 1, 4 Entrance Stairs-Rails: None Home Layout: One level Home Adaptive Equipment: Bedside commode/3-in-1;Crutches Prior Function Level of Independence: Independent Able to Take Stairs?: Yes Driving: Yes Communication Communication: No difficulties    Cognition  Cognition Overall Cognitive Status: Appears within functional limits for tasks assessed/performed Arousal/Alertness: Awake/alert Orientation Level: Appears intact for tasks assessed Behavior During Session: [facility] for tasks performed    Extremity/Trunk Assessment Right Lower Extremity Assessment RLE ROM/Strength/Tone: Deficits RLE ROM/Strength/Tone Deficits: Post-op pain and weakness.   RLE Sensation: WFL - Light Touch Left Lower Extremity Assessment LLE ROM/Strength/Tone: WFL for tasks assessed LLE Sensation: WFL - Light Touch Trunk Assessment Trunk Assessment: Normal   Balance Balance Balance Assessed: No  End of Session PT - End of Session Equipment Utilized During Treatment: Gait belt Activity Tolerance: Patient tolerated treatment well Patient left: in chair;with call bell/phone within reach Nurse Communication: Mobility status  GP     [name], [place] [phone] 11/16/2012, 12:10 PM

## 2012-11-16 NOTE — Anesthesia Postprocedure Evaluation (Addendum)
Anesthesia Post Note  Patient: [name] Short  Procedure(s) Performed: Procedure(s) (LRB): RIGHT TOTAL HIP ARTHROPLASTY ANTERIOR APPROACH (Right)  Anesthesia type: general  Patient location: PACU  Post pain: Pain level controlled  Post assessment: Patient's Cardiovascular Status Stable  Last Vitals:  Filed Vitals:   11/16/12 0621  BP: 123/60  Pulse: 76  Temp: 36.8 C  Resp: 18    Post vital signs: Reviewed and stable  Level of consciousness: sedated  Complications: No apparent anesthesia complications

## 2012-11-16 NOTE — Op Note (Signed)
NAMEGARLEN, REINIG NO.:  [id number]  MEDICAL RECORD NO.:  [id number]  LOCATION:  5N12C                        FACILITY:  [facility]  PHYSICIAN:  Allien Melberg C. Ophelia Charter, M.D.    DATE OF BIRTH:  1946-04-03  DATE OF PROCEDURE:  11/15/2012 DATE OF DISCHARGE:                              OPERATIVE REPORT   PREOPERATIVE DIAGNOSIS:  Right hip osteoarthritis.  POSTOPERATIVE DIAGNOSIS:  Right hip osteoarthritis.  PROCEDURE:  Right anterior total hip arthroplasty.  SURGEON:  Makynli Stills C. Ophelia Charter, MD  ASSISTANT:  Wende Neighbors, PA medically necessary and present for the entire procedure.   ANESTHESIA:  GOT.  ESTIMATED BLOOD LOSS:  Less than 100 mL.  DRAINS:  None.  COMPLICATIONS:  None.  INDICATIONS:  This 67 year old male has had previous total arthroplasty 10 years ago of the opposite hip.  He has had progressive degenerative changes of the involved right hip, bone-on-bone changes, flattening of the head and marginal osteophytes, subchondral sclerosis, ambulatory with a limp, used a cane, taken anti-inflammatories, and pain has progressed to the point where he is having difficulty sleeping, walking, and participating in daily activities.  DESCRIPTION OF PROCEDURE:  After induction of general anesthesia, preoperative Ancef prophylaxis, patient was placed on the Hana table. The boots were applied.  The patient was placed in traction.  Center post had been applied, appropriate positioning.  C-arm was brought in, checked.  Lines were drawn.  He was less than a quarter inch short due to erosive changes and flattening of the head.  The hip was prepped with DuraPrep.  The area was squared with U-drapes.  Large Betadine shower- curtain drape placed over the top half sheets.  Time-out procedure was completed.  Incision was made, starting 1 cm distal and 2 cm lateral to the ASIS and extended over the tensor down to the level of the neck. Soft tissue was meticulously cleaned off the  fascia.  The rubber drain type skin protector was inserted with some difficulty as usual and flipped over, secured to protect the skin.  Fascia was nicked open. Allis clamp placed anteriorly on the fascia, finger dissection down to subcutaneous thin layer of fat overlying the neck.  Cobra was placed medially outside the capsule and using tonsil and cautery, meticulous cauterization of the transverse vessels coming up into the neck and trochanteric region was performed.  Once these vessels were coagulated, capsule was opened after meticulous __________ the anterior aspect of the femur down to the lesser trochanter.  Hohmanns were placed inside the capsule on the neck and neck was cut.  Second cut had been made more superior on the neck in order to remove the head, and there was concern for spinning the head that the sharp spikes of bone on the proximal fragment would damage the muscle.  Head was removed after it was able to be spun.  Some calcified labrum overhanging osteophytes were trimmed using a half-inch curved osteotome.  There were a lot of osteophytes laterally as well as anterior but minimal posterior.  Sequential reaming up to a 53, insertion of 54 acetabulum DePuy with 3 dome holes.  Cup was secured, did not need a screw placement.  There was good  anterior and posterior wall and it was reamed line to line and posterior cortex came even with prosthesis even laterally and after removal of the spurs, was even anteriorly after the spurs had been removed.  Center hole was filled.  There was good stability and no lip liner was inserted.  Release of soft tissue up around the trochanter was performed and the long trochanteric bent Hohmann was inserted with the Mueller placed medially on the neck.  Initially, the peanut broach was used followed by sequential broaching up to 11 for an 11 size which gave excellent impaction, good stability.  It was 1 fingerbreadth above the lesser  trochanter, center down the canal and was hugging the posterior cortex tight and stable.  A +1.5 ceramic ball was inserted.  The patient had ceramic on the opposite side, had excellent relief with this.  A 36 ball was inserted with findings of good stability, external rotation to 100 degrees, trace shock, and no anterior instability.  After irrigation with saline solution, permanent stem was inserted with the ceramic ball, impacted.  Collar was flushed with the calcar region.  There were some remaining pieces of bone that had come off the neck posteriorly that had to be grasped and pulled up.  Care was taken just to remove the bone and not damage the posterior capsule.  Entire area was irrigated again copiously and then standard layered closure with a couple of interrupted sutures in the capsule anteriorly.  Closure of the tensor fascia with locking sutures, subcutaneous closure, and then subcuticular closure, Marcaine infiltration, postop dressing, and transferred to the recovery room.  Instrument count and needle count was correct.  Corian Depuy # 11 stem , plus 1.58mm neck standard and ceramic 36mm ball     Xayden Linsey C. Ophelia Charter, M.D.     MCY/MEDQ  D:  11/15/2012  T:  11/16/2012  Job:  [id number]

## 2012-11-16 NOTE — Progress Notes (Signed)
UR COMPLETED  

## 2012-11-16 NOTE — Progress Notes (Signed)
Subjective: 1 Day Post-Op Procedure(s) (LRB): RIGHT TOTAL HIP ARTHROPLASTY ANTERIOR APPROACH (Right) Patient reports pain as moderate.  Wife report episode of elevated heart rate last pm as high as 140 and states it was "a fib".  Pt doesn't recall feeling elevated heart rate.  Denies SOB.  Evaluated years ago for atrial fib and started on ASA only. Currently on ASA for VTE prophylaxis.  Asked pt and family to notify nurse of any further episode.   Objective: Vital signs in last 24 hours: Temp:  [97.4 F (36.3 C)-98.2 F (36.8 C)] 98.2 F (36.8 C) (02/18 0621) Pulse Rate:  [62-76] 76 (02/18 0621) Resp:  [9-18] 11 (02/18 1108) BP: (100-134)/(56-96) 123/60 mmHg (02/18 0621) SpO2:  [95 %-100 %] 98 % (02/18 1108)  Intake/Output from previous day: 02/17 0701 - 02/18 0700 In: 2021.3 [I.V.:1971.3; IV Piggyback:50] Out: 1700 [Urine:1300; Blood:400] Intake/Output this shift:     Recent Labs  11/16/12 0620  HGB 10.5*    Recent Labs  11/16/12 0620  WBC 12.4*  RBC 3.56*  HCT 32.0*  PLT 227    Recent Labs  11/16/12 0620  NA 138  K 4.3  CL 102  CO2 25  BUN 23  CREATININE 1.21  GLUCOSE 142*  CALCIUM 8.8   No results found for this basename: LABPT, INR,  in the last 72 hours  Neurovascular intact Sensation intact distally Intact pulses distally Dorsiflexion/Plantar flexion intact Incision: dressing C/D/I  Assessment/Plan: 1 Day Post-Op Procedure(s) (LRB): RIGHT TOTAL HIP ARTHROPLASTY ANTERIOR APPROACH (Right) Up with therapy D/C IV fluids Discharge home with home health when stable. Dc PCA  Soul Hackman M 11/16/2012, 3:34 PM

## 2012-11-16 NOTE — Progress Notes (Signed)
CARE MANAGEMENT NOTE 11/16/2012  Patient:  [name] Short,[name] Short   Account Number:  [id number]  Date Initiated:  11/15/2012  Documentation initiated by:  DAVIS,TYMEEKA  Subjective/Objective Assessment:   67 yo male admitted s/p IRRIGATION AND DEBRIDEMENT KNEE WITH POLY EXCHANGE (Left). PTA pt lived independent.     Action/Plan:   Home with HH services. CM spoke with patient and wife concerning home health and DME needs at discharge. Choice offered. Preoperatively setup with [facility], no changes.   Anticipated DC Date:  11/17/2012   Anticipated DC Plan:  HOME W HOME HEALTH SERVICES      DC Planning Services  CM consult      PAC Choice  DURABLE MEDICAL EQUIPMENT  HOME HEALTH   Choice offered to / List presented to:  C-1 Patient   DME arranged  3-N-1  WALKER - ROLLING  CPM      DME agency  [employer]     HH arranged  HH-2 PT      HH agency  [facility]   Status of service:  Completed, signed off Medicare Important Message given?   (If response is "NO", the following Medicare IM given date fields will be blank) Date Medicare IM given:   Date Additional Medicare IM given:    Discharge Disposition:  HOME W HOME HEALTH SERVICES  Per UR Regulation:  Reviewed for med. necessity/level of care/duration of stay  If discussed at Long Length of Stay Meetings, dates discussed:    Comments:  11/15/12 1422 Tymeeka Davis,RN,BSN [phone]

## 2012-11-16 NOTE — Progress Notes (Signed)
Physical Therapy Treatment Patient Details Name: [name] Short MRN: [id number] DOB: 20-Oct-1945 Today's Date: 11/16/2012 Time: 4098-1191 PT Time Calculation (min): 31 min  PT Assessment / Plan / Recommendation Comments on Treatment Session  pt presents with R THA.  pt indicating feeling more sore this pm, but no nausea.  pt moving well.      Follow Up Recommendations  Home health PT;Supervision - Intermittent     Does the patient have the potential to tolerate intense rehabilitation     Barriers to Discharge        Equipment Recommendations  Rolling walker with 5" wheels    Recommendations for Other Services    Frequency 7X/week   Plan Discharge plan remains appropriate;Frequency remains appropriate    Precautions / Restrictions Precautions Precautions: Anterior Hip;Fall Precaution Comments: Order states Direct Anterior No Hip Precautions, however Dr Ophelia Charter told this PT while in pt's room that he wanted Anterior Hip Precautions.  RN notified.   Restrictions Weight Bearing Restrictions: Yes RLE Weight Bearing: Weight bearing as tolerated   Pertinent Vitals/Pain Does not rate, but indicates more "sore" this pm.      Mobility  Bed Mobility Bed Mobility: Supine to Sit;Sitting - Scoot to Edge of Bed;Sit to Supine Supine to Sit: 4: Min assist Sitting - Scoot to Edge of Bed: 5: Supervision Sit to Supine: 4: Min assist Details for Bed Mobility Assistance: A with R LE only.  cues for sequencing.   Transfers Transfers: Sit to Stand;Stand to Sit Sit to Stand: 4: Min guard;With upper extremity assist;From bed Stand to Sit: 4: Min guard;With upper extremity assist;To bed Details for Transfer Assistance: cues for UE use, getting closer to bed prior to sitting.   Ambulation/Gait Ambulation/Gait Assistance: 4: Min guard Ambulation Distance (Feet): 160 Feet Assistive device: Rolling walker Ambulation/Gait Assistance Details: cues for upright posture.  No nausea this pm.   Gait  Pattern: Step-to pattern;Decreased step length - left;Decreased stance time - right Stairs: No Wheelchair Mobility Wheelchair Mobility: No    Exercises Total Joint Exercises Ankle Circles/Pumps: AROM;Both;10 reps Long Arc Quad: AROM;Right;10 reps Marching in Standing: AROM;Right;10 reps (performed in sitting)   PT Diagnosis:    PT Problem List:   PT Treatment Interventions:     PT Goals Acute Rehab PT Goals Time For Goal Achievement: 11/23/12 Potential to Achieve Goals: Good PT Goal: Supine/Side to Sit - Progress: Progressing toward goal PT Goal: Sit to Supine/Side - Progress: Progressing toward goal PT Goal: Sit to Stand - Progress: Progressing toward goal PT Goal: Stand to Sit - Progress: Progressing toward goal PT Goal: Ambulate - Progress: Progressing toward goal PT Goal: Perform Home Exercise Program - Progress: Progressing toward goal Additional Goals PT Goal: Additional Goal #1 - Progress: Progressing toward goal  Visit Information  Last PT Received On: 11/16/12 Assistance Needed: +1    Subjective Data  Subjective: It's just sore.     Cognition  Cognition Overall Cognitive Status: Appears within functional limits for tasks assessed/performed Arousal/Alertness: Awake/alert Orientation Level: Appears intact for tasks assessed Behavior During Session: [facility] for tasks performed    Balance  Balance Balance Assessed: No  End of Session PT - End of Session Equipment Utilized During Treatment: Gait belt Activity Tolerance: Patient tolerated treatment well Patient left: in bed;with call bell/phone within reach;with family/visitor present Nurse Communication: Mobility status   GP     [name], [place] [phone] 11/16/2012, 2:59 PM

## 2012-11-17 LAB — CBC
HCT: 30.1 % — ABNORMAL LOW (ref 39.0–52.0)
Hemoglobin: 10 g/dL — ABNORMAL LOW (ref 13.0–17.0)
MCH: 30.1 pg (ref 26.0–34.0)
MCHC: 33.2 g/dL (ref 30.0–36.0)
MCV: 90.7 fL (ref 78.0–100.0)
Platelets: 208 10*3/uL (ref 150–400)
RBC: 3.32 MIL/uL — ABNORMAL LOW (ref 4.22–5.81)
RDW: 12.8 % (ref 11.5–15.5)
WBC: 9.4 10*3/uL (ref 4.0–10.5)

## 2012-11-17 NOTE — Progress Notes (Signed)
Physical Therapy Treatment Patient Details Name: [name] Short MRN: [id number] DOB: 05-02-1946 Today's Date: 11/17/2012 Time: 0802-0908 PT Time Calculation (min): 66 min  PT Assessment / Plan / Recommendation Comments on Treatment Session  pt rpesents withR THA.  pt moves well, but very slowly.  Continuing to make progress.  Anticipate ready for D/C soon.      Follow Up Recommendations  Home health PT;Supervision - Intermittent     Does the patient have the potential to tolerate intense rehabilitation     Barriers to Discharge        Equipment Recommendations  Rolling walker with 5" wheels    Recommendations for Other Services    Frequency 7X/week   Plan Discharge plan remains appropriate;Frequency remains appropriate    Precautions / Restrictions Precautions Precautions: Anterior Hip;Fall Precaution Comments: Order states Direct Anterior No Hip Precautions, however Dr Ophelia Charter told this PT while in pt's room that he wanted Anterior Hip Precautions.  RN notified.   Restrictions Weight Bearing Restrictions: Yes RLE Weight Bearing: Weight bearing as tolerated   Pertinent Vitals/Pain Indicates pain 5/10, but due for pain meds.  RN made aware.      Mobility  Bed Mobility Bed Mobility: Supine to Sit;Sitting - Scoot to Edge of Bed Supine to Sit: 5: Supervision Sitting - Scoot to Edge of Bed: 5: Supervision Details for Bed Mobility Assistance: pt able to hook LEs together to A R LE OOB.   Transfers Transfers: Sit to Stand;Stand to Sit Sit to Stand: 5: Supervision;With upper extremity assist;From bed Stand to Sit: 5: Supervision;With upper extremity assist;To chair/3-in-1;With armrests Details for Transfer Assistance: [employer] good use of UEs and controls descent to chair.   Ambulation/Gait Ambulation/Gait Assistance: 5: Supervision Ambulation Distance (Feet): 200 Feet (x2) Assistive device: Rolling walker Ambulation/Gait Assistance Details: cues for upright posture and more  continuous gait.   Gait Pattern: Step-to pattern;Decreased step length - left;Decreased stance time - right Stairs: Yes Stairs Assistance: 4: Min assist Stairs Assistance Details (indicate cue type and reason): cues for RW use and safe technique.   Stair Management Technique: No rails;Backwards;With walker Number of Stairs: 4 Wheelchair Mobility Wheelchair Mobility: No    Exercises     PT Diagnosis:    PT Problem List:   PT Treatment Interventions:     PT Goals Acute Rehab PT Goals Time For Goal Achievement: 11/23/12 Potential to Achieve Goals: Good PT Goal: Supine/Side to Sit - Progress: Progressing toward goal PT Goal: Sit to Stand - Progress: Progressing toward goal PT Goal: Stand to Sit - Progress: Progressing toward goal PT Goal: Ambulate - Progress: Progressing toward goal PT Goal: Up/Down Stairs - Progress: Progressing toward goal Additional Goals PT Goal: Additional Goal #1 - Progress: Progressing toward goal  Visit Information  Last PT Received On: 11/17/12 Assistance Needed: +1    Subjective Data  Subjective: It feels better today.     Cognition  Cognition Overall Cognitive Status: Appears within functional limits for tasks assessed/performed Arousal/Alertness: Awake/alert Orientation Level: Appears intact for tasks assessed Behavior During Session: [facility] for tasks performed    Balance  Balance Balance Assessed: No  End of Session PT - End of Session Equipment Utilized During Treatment: Gait belt Activity Tolerance: Patient tolerated treatment well Patient left: in chair;with call bell/phone within reach Nurse Communication: Mobility status   GP     [name], [place] [phone] 11/17/2012, 11:37 AM

## 2012-11-17 NOTE — Progress Notes (Signed)
Patient ID: [name] Short, male   DOB: 04-02-1946, 67 y.o.   MRN: [id number] Pulse NSR,  Working with PT this AM. Possible home this afternoon.

## 2012-11-17 NOTE — Evaluation (Signed)
Occupational Therapy Evaluation Patient Details Name: [name] Short MRN: [id number] DOB: May 28, 1946 Today's Date: 11/17/2012 Time: 4098-1191 OT Time Calculation (min): 47 min  OT Assessment / Plan / Recommendation Clinical Impression  Pt is a 67 y/o male s/p R THA whom presents with deficits in the area of ADL's, self care and functional mobility. He should benefit from acute OT to assist with maximizing independence prior to returning home with family PRN assist. Note: pt has anterior hip precautions.    OT Assessment  Patient needs continued OT Services    Follow Up Recommendations  No OT follow up    Barriers to Discharge      Equipment Recommendations  Tub/shower seat;Other (comment) (?Long handled A/E, pt may be able to get tub bench)    Recommendations for Other Services    Frequency  Min 3X/week    Precautions / Restrictions Precautions Precautions: Anterior Hip;Fall Precaution Comments: Order states Direct Anterior No Hip Precautions, however Dr Ophelia Charter told PT while in pt's room that he wanted Anterior Hip Precautions.  PT then notified this OT as well as Charity fundraiser. Restrictions Weight Bearing Restrictions: Yes RLE Weight Bearing: Weight bearing as tolerated   Pertinent Vitals/Pain 5/10,R hip. RN aware and gave pt pain meds during session    ADL  Eating/Feeding: Performed;Modified independent Where Assessed - Eating/Feeding: Chair Grooming: Performed;Wash/dry hands;Set up Where Assessed - Grooming: Supported sitting Upper Body Bathing: Simulated;Set up Where Assessed - Upper Body Bathing: Supported sitting;Unsupported sitting Lower Body Bathing: Simulated;Moderate assistance Where Assessed - Lower Body Bathing: Supported sit to stand Upper Body Dressing: Simulated;Set up Where Assessed - Upper Body Dressing: Supported sitting;Unsupported sitting Lower Body Dressing: Performed;Minimal assistance Where Assessed - Lower Body Dressing: Supported sitting Toilet Transfer:  Simulated;Minimal assistance Toilet Transfer Method: Sit to stand Toilet Transfer Equipment: Raised toilet seat with arms (or 3-in-1 over toilet) Toileting - Clothing Manipulation and Hygiene: Simulated;Min guard Where Assessed - Toileting Clothing Manipulation and Hygiene: Standing;Sit to stand from 3-in-1 or toilet Tub/Shower Transfer: Performed;Minimal assistance Tub/Shower Transfer Method: Ambulating Tub/Shower Transfer Equipment: Transfer tub bench Equipment Used: Gait belt;Rolling walker;Other (comment) (Tub transfer bench in ADL bathroom) Transfers/Ambulation Related to ADLs: Pt moves well during ambulation and functional mobility, however, very slowly ADL Comments: Pt performed tub transfer with tub transfer bench in ADL bathroom today with Min-Min guard assist and VC's safety, sequencing and hand placement. Pt reports spouse/family to assist after d/c. Issued and reviewed handout re:A/E and DME w/ pt today. Note pt w/ anterior hip precautions.    OT Diagnosis: Generalized weakness;Acute pain  OT Problem List: Decreased activity tolerance;Decreased knowledge of precautions;Decreased knowledge of use of DME or AE;Pain OT Treatment Interventions: Self-care/ADL training;Energy conservation;DME and/or AE instruction;Therapeutic activities;Patient/family education   OT Goals Acute Rehab OT Goals Time For Goal Achievement: 12/01/12 Potential to Achieve Goals: Good  Visit Information  Last OT Received On: 11/17/12 Assistance Needed: +1    Subjective Data  Subjective: Pt reports that he plans to go home with family/spouse assist Patient Stated Goal: Return home   Prior Functioning     Home Living Lives With: Spouse Available Help at Discharge: Family;Available 24 hours/day Type of Home: House Home Access: Stairs to enter [employer] of Steps: 1, 1, 4 Entrance Stairs-Rails: None Home Layout: One level Bathroom Shower/Tub: Forensic scientist:  Standard Home Adaptive Equipment: Bedside commode/3-in-1;Crutches Prior Function Level of Independence: Independent Able to Take Stairs?: Yes Driving: Yes Communication Communication: No difficulties Dominant Hand: Right    Vision/Perception Vision -  History Patient Visual Report: No change from baseline   Cognition  Cognition Overall Cognitive Status: Appears within functional limits for tasks assessed/performed Arousal/Alertness: Awake/alert Orientation Level: Appears intact for tasks assessed Behavior During Session: [facility] for tasks performed    Extremity/Trunk Assessment Right Upper Extremity Assessment RUE ROM/Strength/Tone: Within functional levels RUE Sensation: WFL - Light Touch RUE Coordination: WFL - gross/fine motor Left Upper Extremity Assessment LUE ROM/Strength/Tone: Within functional levels LUE Sensation: WFL - Light Touch LUE Coordination: WFL - gross/fine motor Trunk Assessment Trunk Assessment: Normal     Mobility Bed Mobility Bed Mobility: Supine to Sit;Sitting - Scoot to Edge of Bed Supine to Sit: 5: Supervision Sitting - Scoot to Edge of Bed: 5: Supervision Details for Bed Mobility Assistance: pt able to hook LEs together to A R LE OOB.   Transfers Transfers: Sit to Stand;Stand to Sit Sit to Stand: 5: Supervision;With upper extremity assist;From chair/3-in-1;Other (comment) (to/from tub bench) Stand to Sit: 5: Supervision;To chair/3-in-1;Other (comment) (to/from tub bench) Details for Transfer Assistance: Demos good use of UEs and controls descent to chair.          Balance Balance Balance Assessed: No   End of Session OT - End of Session Equipment Utilized During Treatment: Gait belt;Other (comment) (RW, tub bench) Activity Tolerance: Patient tolerated treatment well Patient left: in chair;with call bell/phone within reach  GO     Alm Bustard 11/17/2012, 12:31 PM

## 2012-11-17 NOTE — Progress Notes (Signed)
Physical Therapy Note   11/17/12 1500  PT Visit Information  Last PT Received On 11/17/12  Assistance Needed +1  PT Time Calculation  PT Start Time 1426  PT Stop Time 1503  PT Time Calculation (min) 37 min  Subjective Data  Subjective I'm sore this afternoon.    Precautions  Precautions Anterior Hip;Fall  Precaution Comments Order states Direct Anterior No Hip Precautions, however Dr Ophelia Charter told this PT while in pt's room that he wanted Anterior Hip Precautions.  RN notified.    Restrictions  Weight Bearing Restrictions Yes  RLE Weight Bearing WBAT  Cognition  Overall Cognitive Status Appears within functional limits for tasks assessed/performed  Arousal/Alertness Awake/alert  Orientation Level Appears intact for tasks assessed  Behavior During Session [facility] for tasks performed  Bed Mobility  Bed Mobility Not assessed  Transfers  Transfers Stand to Sit  Stand to Sit 5: Supervision;With upper extremity assist;To chair/3-in-1;With armrests  Details for Transfer Assistance demos good technique.    Ambulation/Gait  Ambulation/Gait Assistance 5: Supervision  Ambulation Distance (Feet) 200 Feet (75)  Assistive device Rolling walker  Ambulation/Gait Assistance Details cues for upright posture.    Gait Pattern Step-to pattern;Decreased step length - left;Decreased stance time - right  Stairs Yes  Stairs Assistance 4: Min assist  Stairs Assistance Details (indicate cue type and reason) cues for safe technique and education with wife.    Stair Management Technique No rails;Backwards;With walker  Number of Stairs 2  Wheelchair Mobility  Wheelchair Mobility No  Balance  Balance Assessed No  PT - End of Session  Equipment Utilized During Treatment Gait belt  Activity Tolerance Patient tolerated treatment well  Patient left in chair;with call bell/phone within reach  Nurse Communication Mobility status  PT - Assessment/Plan  Comments on Treatment Session pt rpesents with R THA.  pt  sore this pm, but moving very well.    PT Plan Discharge plan remains appropriate;Frequency remains appropriate  PT Frequency 7X/week  Follow Up Recommendations Home health PT;Supervision - Intermittent  PT equipment Rolling walker with 5" wheels  Acute Rehab PT Goals  Time For Goal Achievement 11/23/12  Potential to Achieve Goals Good  PT Goal: Sit to Stand - Progress Progressing toward goal  PT Goal: Stand to Sit - Progress Progressing toward goal  PT Goal: Ambulate - Progress Progressing toward goal  PT Goal: Up/Down Stairs - Progress Progressing toward goal  Additional Goals  PT Goal: Additional Goal #1 - Progress Progressing toward goal  PT General Charges  $$ ACUTE PT VISIT 1 Procedure  PT Treatments  $Gait Training 38-52 mins   [place], [place] [phone]

## 2012-11-18 ENCOUNTER — Encounter (HOSPITAL_COMMUNITY): Payer: Self-pay | Admitting: General Practice

## 2012-11-18 LAB — CBC
HCT: 28.4 % — ABNORMAL LOW (ref 39.0–52.0)
Hemoglobin: 9.4 g/dL — ABNORMAL LOW (ref 13.0–17.0)
MCH: 29.9 pg (ref 26.0–34.0)
MCHC: 33.1 g/dL (ref 30.0–36.0)
MCV: 90.4 fL (ref 78.0–100.0)
Platelets: 198 10*3/uL (ref 150–400)
RBC: 3.14 MIL/uL — ABNORMAL LOW (ref 4.22–5.81)
RDW: 12.7 % (ref 11.5–15.5)
WBC: 10.7 10*3/uL — ABNORMAL HIGH (ref 4.0–10.5)

## 2012-11-18 MED ORDER — METHOCARBAMOL 500 MG PO TABS: 500.0000 mg | ORAL_TABLET | Freq: Four times a day (QID) | ORAL | Status: DC | PRN

## 2012-11-18 MED ORDER — OXYCODONE-ACETAMINOPHEN 5-325 MG PO TABS: 1.0000 | ORAL_TABLET | ORAL | Status: DC | PRN

## 2012-11-18 NOTE — Progress Notes (Signed)
Physical Therapy Treatment Patient Details Name: [name] Short MRN: [id number] DOB: September 05, 1946 Today's Date: 11/18/2012 Time: 4098-1191 PT Time Calculation (min): 33 min  PT Assessment / Plan / Recommendation Comments on Treatment Session  pt presents with R THA.  pt moving great.  REady for D/C from PT stand point.      Follow Up Recommendations  Home health PT;Supervision - Intermittent     Does the patient have the potential to tolerate intense rehabilitation     Barriers to Discharge        Equipment Recommendations  Rolling walker with 5" wheels    Recommendations for Other Services    Frequency 7X/week   Plan Discharge plan remains appropriate;Frequency remains appropriate    Precautions / Restrictions Precautions Precautions: Anterior Hip;Fall Precaution Comments: Order states Direct Anterior No Hip Precautions, however Dr [name] Short told this PT while in pt's room that he wanted Anterior Hip Precautions.  RN notified.   Restrictions Weight Bearing Restrictions: Yes RLE Weight Bearing: Weight bearing as tolerated   Pertinent Vitals/Pain 5/10.  RN medicated just prior to PT.      Mobility  Bed Mobility Bed Mobility: Supine to Sit;Sitting - Scoot to Edge of Bed Supine to Sit: 5: Supervision Sitting - Scoot to Edge of Bed: 5: Supervision Details for Bed Mobility Assistance: pt able to hook LEs together to A R LE OOB.   Transfers Transfers: Sit to Stand;Stand to Sit Sit to Stand: 5: Supervision;With upper extremity assist;From bed Stand to Sit: 5: Supervision;With upper extremity assist;To chair/3-in-1;With armrests Details for Transfer Assistance: demos good technique.   Ambulation/Gait Ambulation/Gait Assistance: 5: Supervision Ambulation Distance (Feet): 180 Feet Assistive device: Rolling walker Ambulation/Gait Assistance Details: cues for positioning in RW.   Gait Pattern: Step-to pattern;Decreased step length - left;Decreased stance time - right Stairs:  No Wheelchair Mobility Wheelchair Mobility: No    Exercises Total Joint Exercises Ankle Circles/Pumps: AROM;Both;10 reps Long Arc Quad: AROM;Right;10 reps Marching in Standing: AROM;Right;10 reps   PT Diagnosis:    PT Problem List:   PT Treatment Interventions:     PT Goals Acute Rehab PT Goals Time For Goal Achievement: 11/23/12 Potential to Achieve Goals: Good PT Goal: Supine/Side to Sit - Progress: Progressing toward goal PT Goal: Sit to Stand - Progress: Progressing toward goal PT Goal: Stand to Sit - Progress: Progressing toward goal PT Goal: Ambulate - Progress: Progressing toward goal PT Goal: Perform Home Exercise Program - Progress: Progressing toward goal Additional Goals PT Goal: Additional Goal #1 - Progress: Progressing toward goal  Visit Information  Last PT Received On: 11/18/12 Assistance Needed: +1    Subjective Data  Subjective: I'm doing ok today.     Cognition  Cognition Overall Cognitive Status: Appears within functional limits for tasks assessed/performed Arousal/Alertness: Awake/alert Orientation Level: Appears intact for tasks assessed Behavior During Session: [facility] for tasks performed    Balance  Balance Balance Assessed: No  End of Session PT - End of Session Equipment Utilized During Treatment: Gait belt Activity Tolerance: Patient tolerated treatment well Patient left: in chair;with call bell/phone within reach Nurse Communication: Mobility status   GP     [name] Short, [place] [phone] 11/18/2012, 11:54 AM

## 2012-11-18 NOTE — Progress Notes (Signed)
Subjective: 3 Days Post-Op Procedure(s) (LRB): RIGHT TOTAL HIP ARTHROPLASTY ANTERIOR APPROACH (Right) Patient reports pain as mild.    Objective: Vital signs in last 24 hours: Temp:  [97.7 F (36.5 C)-99.5 F (37.5 C)] 97.7 F (36.5 C) (02/20 0641) Pulse Rate:  [101-106] 102 (02/20 0641) Resp:  [16-18] 18 (02/20 0641) BP: (120-136)/(72-73) 120/73 mmHg (02/20 0641) SpO2:  [97 %-100 %] 98 % (02/20 0641)  Intake/Output from previous day: 02/19 0701 - 02/20 0700 In: -  Out: 1650 [Urine:1650] Intake/Output this shift:     Recent Labs  11/16/12 0620 11/17/12 0515 11/18/12 0553  HGB 10.5* 10.0* 9.4*    Recent Labs  11/17/12 0515 11/18/12 0553  WBC 9.4 10.7*  RBC 3.32* 3.14*  HCT 30.1* 28.4*  PLT 208 198    Recent Labs  11/16/12 0620  NA 138  K 4.3  CL 102  CO2 25  BUN 23  CREATININE 1.21  GLUCOSE 142*  CALCIUM 8.8   No results found for this basename: LABPT, INR,  in the last 72 hours  Neurovascular intact Intact pulses distally Dorsiflexion/Plantar flexion intact Incision: no drainage  Assessment/Plan: 3 Days Post-Op Procedure(s) (LRB): RIGHT TOTAL HIP ARTHROPLASTY ANTERIOR APPROACH (Right) Discharge home with home health Continue aspirin for VTE prophylaxis RX percocet and robaxin OV 2 weeks  Suraya Vidrine M 11/18/2012, 8:22 AM

## 2012-11-18 NOTE — Progress Notes (Signed)
Patient discharged home in stable condition via wheelchair. Discharge instructions and prescriptions were given and explained.

## 2012-11-22 NOTE — Discharge Summary (Signed)
Physician Discharge Summary  Patient ID: [name] Short MRN: [id number] DOB/AGE: Aug 06, 1946 67 y.o.  Admit date: 11/15/2012 Discharge date: 11/22/2012  Admission Diagnoses:  Osteoarthritis of right hip  Discharge Diagnoses:  Principal Problem:   Osteoarthritis of right hip   Past Medical History  Diagnosis Date  . Atrial fibrillation   . History of kidney stones     hasn't had any since the 1990's--had lithotripsey  . Arthritis     Surgeries: Procedure(s): RIGHT TOTAL HIP ARTHROPLASTY ANTERIOR APPROACH on 11/15/2012   Consultants (if any):  none  Discharged Condition: Improved  Hospital Course: Dallon Dacosta is an 67 y.o. male who was admitted 11/15/2012 with a diagnosis of Osteoarthritis of right hip and went to the operating room on 11/15/2012 and underwent the above named procedures.    He was given perioperative antibiotics:  Anti-infectives   Start     Dose/Rate Route Frequency Ordered Stop   11/15/12 1900  ceFAZolin (ANCEF) IVPB 1 g/50 mL premix     1 g 100 mL/hr over 30 Minutes Intravenous Every 6 hours 11/15/12 1652 11/16/12 0130   11/15/12 0600  ceFAZolin (ANCEF) IVPB 2 g/50 mL premix     2 g 100 mL/hr over 30 Minutes Intravenous On call to O.R. 11/14/12 1332 11/15/12 1256    .  He was given sequential compression devices, early ambulation, and aspirin for DVT prophylaxis.  He benefited maximally from the hospital stay and there were no complications.    Recent vital signs:  Filed Vitals:   11/18/12 0641  BP: 120/73  Pulse: 102  Temp: 97.7 F (36.5 C)  Resp: 18    Recent laboratory studies:  Lab Results  Component Value Date   HGB 9.4* 11/18/2012   HGB 10.0* 11/17/2012   HGB 10.5* 11/16/2012   Lab Results  Component Value Date   WBC 10.7* 11/18/2012   PLT 198 11/18/2012   Lab Results  Component Value Date   INR 0.96 11/10/2012   Lab Results  Component Value Date   NA 138 11/16/2012   K 4.3 11/16/2012   CL 102 11/16/2012   CO2 25  11/16/2012   BUN 23 11/16/2012   CREATININE 1.21 11/16/2012   GLUCOSE 142* 11/16/2012    Discharge Medications:     Medication List    TAKE these medications       aspirin 325 MG EC tablet  Take 325 mg by mouth daily.     calcium carbonate 600 MG Tabs  Commonly known as:  OS-CAL  Take 600 mg by mouth daily.     fenofibrate 160 MG tablet  Take 160 mg by mouth daily.     Fish Oil 1200 MG Caps  Take 1,200 mg by mouth daily.     ibuprofen 200 MG tablet  Commonly known as:  ADVIL,MOTRIN  Take 400 mg by mouth 3 (three) times daily as needed. For pain     methocarbamol 500 MG tablet  Commonly known as:  ROBAXIN  Take 1 tablet (500 mg total) by mouth every 6 (six) hours as needed.     multivitamin with minerals Tabs  Take 1 tablet by mouth daily.     naproxen sodium 220 MG tablet  Commonly known as:  ANAPROX  Take 440 mg by mouth 2 (two) times daily as needed. For pain     oxyCODONE-acetaminophen 5-325 MG per tablet  Commonly known as:  ROXICET  Take 1-2 tablets by mouth every 4 (four) hours as needed for pain.  Diagnostic Studies: Dg Chest 2 View  11/10/2012  *RADIOLOGY REPORT*  Clinical Data: Preoperative respiratory exam for right hip arthroplasty.  Smoking history.  Atrial fibrillation.  CHEST - 2 VIEW  Comparison: 09/02/2010  Findings: Heart size is normal.  There is mild unfolding of the aorta.  The lungs are clear.  The vascularity is normal.  No effusions.  Ordinary mild degenerative changes effect the spine.  IMPRESSION: No active disease   Original Report Authenticated By: Paulina Fusi, M.D.    Dg Hip Operative Right  11/15/2012  *RADIOLOGY REPORT*  Clinical Data: Right hip anterior arthroplasty  DG OPERATIVE RIGHT HIP  Comparison: None.  Findings: Two C-arm images show a hip replacement on the right. Components appear well positioned without radiographically detectable complication.  IMPRESSION: Hip replacement on the right.   Original Report Authenticated By:  Paulina Fusi, M.D.     Disposition: 06-Home-Health Care Svc      Discharge Orders   Future Orders Complete By Expires     Call MD / Call 911  As directed     Comments:      If you experience chest pain or shortness of breath, CALL 911 and be transported to the hospital emergency room.  If you develope a fever above 101 F, pus (white drainage) or increased drainage or redness at the wound, or calf pain, call your surgeon's office.    Constipation Prevention  As directed     Comments:      Drink plenty of fluids.  Prune juice may be helpful.  You may use a stool softener, such as Colace (over the counter) 100 mg twice a day.  Use MiraLax (over the counter) for constipation as needed.    Diet - low sodium heart healthy  As directed     Discharge instructions  As directed     Comments:      Keep hip incision dry for 5 days post op then may wet while bathing. Therapy daily . Call if fever or chills or increased drainage. Go to ER if acutely short of breath or call for ambulance. Return for follow up in 2 weeks. May full weight bear on the surgical leg unless told otherwise. In house walking for first 2 weeks.    Driving restrictions  As directed     Comments:      No driving    Increase activity slowly as tolerated  As directed        Follow-up Information   Follow up with YATES,MARK C, MD. Schedule an appointment as soon as possible for a visit in 2 weeks.   Contact information:   [address] [place] [address] [phone]        Signed: Wende Neighbors 11/22/2012, 4:39 PM

## 2012-11-23 ENCOUNTER — Other Ambulatory Visit (HOSPITAL_COMMUNITY): Payer: Self-pay | Admitting: Orthopaedic Surgery

## 2012-11-23 ENCOUNTER — Other Ambulatory Visit: Payer: Self-pay | Admitting: Orthopaedic Surgery

## 2012-11-23 ENCOUNTER — Ambulatory Visit (HOSPITAL_COMMUNITY)
Admission: RE | Admit: 2012-11-23 | Discharge: 2012-11-23 | Disposition: A | Discharge status: Home / Self Care | Payer: Medicare Other | Source: Ambulatory Visit | Attending: Orthopaedic Surgery | Admitting: Orthopaedic Surgery

## 2012-11-23 DIAGNOSIS — Z96649 Presence of unspecified artificial hip joint: Secondary | ICD-10-CM | POA: Diagnosis not present

## 2012-11-23 DIAGNOSIS — M7989 Other specified soft tissue disorders: Secondary | ICD-10-CM

## 2012-11-23 DIAGNOSIS — M79604 Pain in right leg: Secondary | ICD-10-CM

## 2012-11-23 DIAGNOSIS — M25561 Pain in right knee: Secondary | ICD-10-CM

## 2012-11-23 DIAGNOSIS — M79609 Pain in unspecified limb: Secondary | ICD-10-CM | POA: Insufficient documentation

## 2012-11-23 DIAGNOSIS — M169 Osteoarthritis of hip, unspecified: Secondary | ICD-10-CM | POA: Diagnosis not present

## 2012-11-23 DIAGNOSIS — Z09 Encounter for follow-up examination after completed treatment for conditions other than malignant neoplasm: Secondary | ICD-10-CM | POA: Diagnosis not present

## 2012-11-23 NOTE — Progress Notes (Signed)
VASCULAR LAB PRELIMINARY  PRELIMINARY  PRELIMINARY  PRELIMINARY  Right lower extremity venous duplex completed.    Preliminary report: Right leg is negative for deep and superficial vein thrombosis.    Odette Watanabe, RVT 11/23/2012, 7:19 PM

## 2012-11-24 ENCOUNTER — Other Ambulatory Visit: Payer: Medicare Other

## 2013-03-18 ENCOUNTER — Telehealth: Payer: Self-pay | Admitting: Family Medicine

## 2013-03-18 NOTE — Telephone Encounter (Signed)
LM @ (4:29pm) asking the pt to RTC regarding the pharmacy for the refill request.//AB/CMA

## 2013-03-18 NOTE — Telephone Encounter (Signed)
Refill: Fenofibrate 160 mg tab. Take 1 tablet by mouth daily. Qty 90. Send to [employer] [facility]

## 2013-03-22 DIAGNOSIS — M169 Osteoarthritis of hip, unspecified: Secondary | ICD-10-CM | POA: Diagnosis not present

## 2013-03-22 DIAGNOSIS — Z96649 Presence of unspecified artificial hip joint: Secondary | ICD-10-CM | POA: Diagnosis not present

## 2013-03-23 NOTE — Telephone Encounter (Signed)
LM @ (11:02am) asking the pt to RTC regarding the pharmacy for the refill request.//AB/CMA

## 2013-03-25 MED ORDER — FENOFIBRATE 160 MG PO TABS: 160.0000 mg | ORAL_TABLET | Freq: Every day | ORAL | Status: DC

## 2013-03-25 NOTE — Telephone Encounter (Signed)
Letter mailed to schedule CPE.    KP 

## 2013-05-04 DIAGNOSIS — R3129 Other microscopic hematuria: Secondary | ICD-10-CM | POA: Diagnosis not present

## 2013-05-04 DIAGNOSIS — N200 Calculus of kidney: Secondary | ICD-10-CM | POA: Diagnosis not present

## 2013-05-05 DIAGNOSIS — N200 Calculus of kidney: Secondary | ICD-10-CM | POA: Diagnosis not present

## 2013-05-19 DIAGNOSIS — N200 Calculus of kidney: Secondary | ICD-10-CM | POA: Diagnosis not present

## 2013-05-19 DIAGNOSIS — R3129 Other microscopic hematuria: Secondary | ICD-10-CM | POA: Diagnosis not present

## 2013-05-24 ENCOUNTER — Other Ambulatory Visit: Payer: Self-pay | Admitting: Urology

## 2013-06-08 ENCOUNTER — Encounter: Payer: Self-pay | Admitting: Family Medicine

## 2013-06-08 ENCOUNTER — Ambulatory Visit (INDEPENDENT_AMBULATORY_CARE_PROVIDER_SITE_OTHER): Payer: Medicare Other | Admitting: Family Medicine

## 2013-06-08 VITALS — BP 120/80 | HR 60 | Temp 98.5°F | Ht 65.5 in | Wt 182.6 lb

## 2013-06-08 DIAGNOSIS — Z0000 Encounter for general adult medical examination without abnormal findings: Secondary | ICD-10-CM | POA: Diagnosis not present

## 2013-06-08 DIAGNOSIS — Z125 Encounter for screening for malignant neoplasm of prostate: Secondary | ICD-10-CM

## 2013-06-08 DIAGNOSIS — E783 Hyperchylomicronemia: Secondary | ICD-10-CM

## 2013-06-08 DIAGNOSIS — Z23 Encounter for immunization: Secondary | ICD-10-CM | POA: Diagnosis not present

## 2013-06-08 DIAGNOSIS — Z1331 Encounter for screening for depression: Secondary | ICD-10-CM | POA: Diagnosis not present

## 2013-06-08 LAB — BASIC METABOLIC PANEL
BUN: 21 mg/dL (ref 6–23)
CO2: 26 mEq/L (ref 19–32)
Calcium: 9.3 mg/dL (ref 8.4–10.5)
Chloride: 104 mEq/L (ref 96–112)
Creatinine, Ser: 1.3 mg/dL (ref 0.4–1.5)
GFR: 59.28 mL/min — ABNORMAL LOW (ref >60.00)
Glucose, Bld: 101 mg/dL — ABNORMAL HIGH (ref 70–99)
Potassium: 4.4 mEq/L (ref 3.5–5.1)
Sodium: 138 mEq/L (ref 135–145)

## 2013-06-08 LAB — HEPATIC FUNCTION PANEL
ALT: 24 U/L (ref 0–53)
AST: 26 U/L (ref 0–37)
Albumin: 4.5 g/dL (ref 3.5–5.2)
Alkaline Phosphatase: 48 U/L (ref 39–117)
Bilirubin, Direct: 0.1 mg/dL (ref 0.0–0.3)
Total Bilirubin: 0.6 mg/dL (ref 0.3–1.2)
Total Protein: 7.1 g/dL (ref 6.0–8.3)

## 2013-06-08 LAB — CBC WITH DIFFERENTIAL/PLATELET
Basophils Absolute: 0 10*3/uL (ref 0.0–0.1)
Basophils Relative: 0.6 % (ref 0.0–3.0)
Eosinophils Absolute: 0.2 10*3/uL (ref 0.0–0.7)
Eosinophils Relative: 2.6 % (ref 0.0–5.0)
HCT: 39.7 % (ref 39.0–52.0)
Hemoglobin: 13.1 g/dL (ref 13.0–17.0)
Lymphocytes Relative: 25.8 % (ref 12.0–46.0)
Lymphs Abs: 1.8 10*3/uL (ref 0.7–4.0)
MCHC: 33.1 g/dL (ref 30.0–36.0)
MCV: 90 fl (ref 78.0–100.0)
Monocytes Absolute: 0.5 10*3/uL (ref 0.1–1.0)
Monocytes Relative: 6.9 % (ref 3.0–12.0)
Neutro Abs: 4.4 10*3/uL (ref 1.4–7.7)
Neutrophils Relative %: 64.1 % (ref 43.0–77.0)
Platelets: 238 10*3/uL (ref 150.0–400.0)
RBC: 4.41 Mil/uL (ref 4.22–5.81)
RDW: 12.9 % (ref 11.5–14.6)
WBC: 6.9 10*3/uL (ref 4.5–10.5)

## 2013-06-08 LAB — LIPID PANEL
Cholesterol: 199 mg/dL (ref 0–200)
HDL: 36.5 mg/dL — ABNORMAL LOW (ref >39.00)
LDL Cholesterol: 137 mg/dL — ABNORMAL HIGH (ref 0–99)
Total CHOL/HDL Ratio: 5
Triglycerides: 129 mg/dL (ref 0.0–149.0)
VLDL: 25.8 mg/dL (ref 0.0–40.0)

## 2013-06-08 LAB — TSH: TSH: 1.98 u[IU]/mL (ref 0.35–5.50)

## 2013-06-08 LAB — PSA: PSA: 1.2 ng/mL (ref 0.10–4.00)

## 2013-06-08 NOTE — Patient Instructions (Addendum)
Follow up in 1 year- sooner if needed Keep up the good work!  You look great! We'll notify you of your lab results and make any changes if needed Call with any questions or concerns Enjoy the wedding!

## 2013-06-08 NOTE — Progress Notes (Signed)
  Subjective:    Patient ID: [name] Short, male    DOB: 1945/10/10, 67 y.o.   MRN: [id number]  HPI Here today for CPE.  Risk Factors: Elevated trigs- chronic problem, on Fenofibrate.  No abd pain, N/V, myalgias Kidney stones- new to provider, now following w/ urology Mena Goes).  Had 2 episodes in May/June.  Has lithotripsy pending. Physical Activity: playing tennis regularly Fall Risk: low Depression: no sxs Hearing: normal to conversational tones and whispered voice at 6 ft ADL's: independent Cognitive: normal linear thought process, memory and attention intact Home Safety: safe at home, lives w/ wife and father-in-law Height, Weight, BMI, Visual Acuity: see vitals, vision corrected to 20/20 w/ glasses Counseling: UTD on colonoscopy 2008 ([place]), had DRE w/ urology Labs Ordered: See A&P Care Plan: See A&P    Review of Systems Patient reports no vision/hearing changes, anorexia, fever ,adenopathy, persistant/recurrent hoarseness, swallowing issues, chest pain, palpitations, edema, persistant/recurrent cough, hemoptysis, dyspnea (rest,exertional, paroxysmal nocturnal), gastrointestinal  bleeding (melena, rectal bleeding), abdominal pain, excessive heart burn, GU symptoms (dysuria, hematuria, voiding/incontinence issues) syncope, focal weakness, memory loss, numbness & tingling, skin/hair/nail changes, depression, anxiety, abnormal bruising/bleeding, musculoskeletal symptoms/signs.     Objective:   Physical Exam BP 120/80  Pulse 60  Temp(Src) 98.5 F (36.9 C) (Oral)  Ht 5' 5.5" (1.664 m)  Wt 182 lb 9.6 oz (82.827 kg)  BMI 29.91 kg/m2  SpO2 97%  General Appearance:    Alert, cooperative, no distress, appears stated age  Head:    Normocephalic, without obvious abnormality, atraumatic  Eyes:    PERRL, conjunctiva/corneas clear, EOM's intact, fundi    benign, both eyes       Ears:    Normal TM's and external ear canals, both ears  Nose:   Nares normal, septum midline,  mucosa normal, no drainage   or sinus tenderness  Throat:   Lips, mucosa, and tongue normal; teeth and gums normal  Neck:   Supple, symmetrical, trachea midline, no adenopathy;       thyroid:  No enlargement/tenderness/nodules  Back:     Symmetric, no curvature, ROM normal, no CVA tenderness  Lungs:     Clear to auscultation bilaterally, respirations unlabored  Chest wall:    No tenderness or deformity  Heart:    Regular rate and rhythm, S1 and S2 normal, no murmur, rub   or gallop  Abdomen:     Soft, non-tender, bowel sounds active all four quadrants,    no masses, no organomegaly  Genitalia:    Deferred to urology  Rectal:    Extremities:   Extremities normal, atraumatic, no cyanosis or edema  Pulses:   2+ and symmetric all extremities  Skin:   Skin color, texture, turgor normal, no rashes or lesions  Lymph nodes:   Cervical, supraclavicular, and axillary nodes normal  Neurologic:   CNII-XII intact. Normal strength, sensation and reflexes      throughout          Assessment & Plan:

## 2013-06-08 NOTE — Assessment & Plan Note (Signed)
Pt's PE WNL.  UTD on colonoscopy, urology exams.  Check labs.  Had EKG done earlier this year- will not repeat.  Anticipatory guidance provided.

## 2013-06-08 NOTE — Assessment & Plan Note (Signed)
Chronic problem.  Tolerating fenofibrate w/out difficulty.  Check labs.  Adjust meds prn

## 2013-06-09 ENCOUNTER — Encounter: Payer: Self-pay | Admitting: General Practice

## 2013-07-02 ENCOUNTER — Other Ambulatory Visit: Payer: Self-pay | Admitting: Family Medicine

## 2013-07-04 NOTE — Telephone Encounter (Signed)
Med filled.  

## 2013-07-06 ENCOUNTER — Encounter (HOSPITAL_BASED_OUTPATIENT_CLINIC_OR_DEPARTMENT_OTHER): Payer: Self-pay | Admitting: *Deleted

## 2013-07-06 ENCOUNTER — Encounter (HOSPITAL_COMMUNITY): Payer: Self-pay | Admitting: Pharmacy Technician

## 2013-07-06 NOTE — Progress Notes (Signed)
NPO AFTER MN. ARRIVES AT 0600. NEEDS HG. CURRENT EKG IN EPIC AND CHART. MAY TAKE HYDROCODONE IF NEEDED AM DOS W/ SIPS OF WATER.

## 2013-07-11 ENCOUNTER — Encounter (HOSPITAL_BASED_OUTPATIENT_CLINIC_OR_DEPARTMENT_OTHER): Payer: Self-pay | Admitting: Anesthesiology

## 2013-07-11 NOTE — Anesthesia Preprocedure Evaluation (Addendum)
Anesthesia Evaluation  Patient identified by MRN, date of birth, ID band Patient awake  General Assessment Comment:Seen by primary Dr. Beverely Low on 06-08-13. No active issues.  Reviewed: Allergy & Precautions, H&P , NPO status , Patient's Chart, lab work & pertinent test results  History of Anesthesia Complications (+) history of anesthetic complications  Airway Mallampati: II TM Distance: >3 FB Neck ROM: Full    Dental no notable dental hx.    Pulmonary neg pulmonary ROS, Current Smoker,  CXR 11-10-12 no active disease. breath sounds clear to auscultation  Pulmonary exam normal       Cardiovascular Exercise Tolerance: Good + dysrhythmias Atrial Fibrillation Rhythm:Regular Rate:Normal  ECG normal 11-10-12  ECHO 05-08-10 EF 55%, no significant valvular disease.   Neuro/Psych PSYCHIATRIC DISORDERS negative neurological ROS     GI/Hepatic negative GI ROS, Neg liver ROS,   Endo/Other  negative endocrine ROS  Renal/GU Renal disease  negative genitourinary   Musculoskeletal negative musculoskeletal ROS (+)   Abdominal   Peds negative pediatric ROS (+)  Hematology negative hematology ROS (+)   Anesthesia Other Findings   Reproductive/Obstetrics negative OB ROS                         Anesthesia Physical Anesthesia Plan  ASA: III  Anesthesia Plan: General   Post-op Pain Management:    Induction: Intravenous  Airway Management Planned: LMA  Additional Equipment:   Intra-op Plan:   Post-operative Plan: Extubation in OR  Informed Consent: I have reviewed the patients History and Physical, chart, labs and discussed the procedure including the risks, benefits and alternatives for the proposed anesthesia with the patient or authorized representative who has indicated his/her understanding and acceptance.   Dental advisory given  Plan Discussed with: CRNA  Anesthesia Plan Comments: (Glidescope  11-15-12)        Anesthesia Quick Evaluation

## 2013-07-12 ENCOUNTER — Ambulatory Visit (HOSPITAL_COMMUNITY): Payer: Medicare Other

## 2013-07-12 ENCOUNTER — Encounter (HOSPITAL_BASED_OUTPATIENT_CLINIC_OR_DEPARTMENT_OTHER): Payer: Self-pay | Admitting: *Deleted

## 2013-07-12 ENCOUNTER — Encounter (HOSPITAL_BASED_OUTPATIENT_CLINIC_OR_DEPARTMENT_OTHER)
Admission: RE | Disposition: A | Discharge status: Home / Self Care | Payer: Self-pay | Source: Ambulatory Visit | Attending: Urology

## 2013-07-12 ENCOUNTER — Ambulatory Visit (HOSPITAL_BASED_OUTPATIENT_CLINIC_OR_DEPARTMENT_OTHER)
Admission: RE | Admit: 2013-07-12 | Discharge: 2013-07-12 | Disposition: A | Discharge status: Home / Self Care | Payer: Medicare Other | Source: Ambulatory Visit | Attending: Urology | Admitting: Urology

## 2013-07-12 ENCOUNTER — Ambulatory Visit (HOSPITAL_BASED_OUTPATIENT_CLINIC_OR_DEPARTMENT_OTHER): Payer: Medicare Other | Admitting: Anesthesiology

## 2013-07-12 ENCOUNTER — Encounter (HOSPITAL_BASED_OUTPATIENT_CLINIC_OR_DEPARTMENT_OTHER): Payer: Medicare Other | Admitting: Anesthesiology

## 2013-07-12 DIAGNOSIS — N200 Calculus of kidney: Secondary | ICD-10-CM | POA: Insufficient documentation

## 2013-07-12 DIAGNOSIS — Z7982 Long term (current) use of aspirin: Secondary | ICD-10-CM | POA: Diagnosis not present

## 2013-07-12 DIAGNOSIS — N139 Obstructive and reflux uropathy, unspecified: Secondary | ICD-10-CM | POA: Diagnosis not present

## 2013-07-12 DIAGNOSIS — N401 Enlarged prostate with lower urinary tract symptoms: Secondary | ICD-10-CM | POA: Insufficient documentation

## 2013-07-12 DIAGNOSIS — E785 Hyperlipidemia, unspecified: Secondary | ICD-10-CM | POA: Diagnosis not present

## 2013-07-12 DIAGNOSIS — N138 Other obstructive and reflux uropathy: Secondary | ICD-10-CM | POA: Diagnosis not present

## 2013-07-12 DIAGNOSIS — Z466 Encounter for fitting and adjustment of urinary device: Secondary | ICD-10-CM | POA: Diagnosis not present

## 2013-07-12 DIAGNOSIS — R319 Hematuria, unspecified: Secondary | ICD-10-CM | POA: Diagnosis not present

## 2013-07-12 DIAGNOSIS — I4891 Unspecified atrial fibrillation: Secondary | ICD-10-CM | POA: Insufficient documentation

## 2013-07-12 HISTORY — DX: Other complications of anesthesia, initial encounter: T88.59XA

## 2013-07-12 HISTORY — DX: Hyperlipidemia, unspecified: E78.5

## 2013-07-12 HISTORY — PX: CYSTOSCOPY WITH URETEROSCOPY AND STENT PLACEMENT: SHX6377

## 2013-07-12 HISTORY — DX: Paroxysmal atrial fibrillation: I48.0

## 2013-07-12 HISTORY — DX: Adverse effect of unspecified anesthetic, initial encounter: T41.45XA

## 2013-07-12 HISTORY — DX: Personal history of traumatic brain injury: Z87.820

## 2013-07-12 HISTORY — DX: Personal history of other diseases of the musculoskeletal system and connective tissue: Z87.39

## 2013-07-12 HISTORY — DX: Calculus of kidney: N20.0

## 2013-07-12 LAB — POCT HEMOGLOBIN-HEMACUE: Hemoglobin: 13.4 g/dL (ref 13.0–17.0)

## 2013-07-12 SURGERY — CYSTOURETEROSCOPY, WITH STENT INSERTION
Anesthesia: General | Site: Ureter | Laterality: Left | Wound class: Clean Contaminated

## 2013-07-12 MED ORDER — LIDOCAINE HCL (CARDIAC) 20 MG/ML IV SOLN
INTRAVENOUS | Status: DC | PRN
  Administered 2013-07-12: 100 mg via INTRAVENOUS

## 2013-07-12 MED ORDER — CEFAZOLIN SODIUM-DEXTROSE 2-3 GM-% IV SOLR
2.0000 g | INTRAVENOUS | Status: DC
  Filled 2013-07-12: qty 50

## 2013-07-12 MED ORDER — ONDANSETRON HCL 4 MG/2ML IJ SOLN
INTRAMUSCULAR | Status: DC | PRN
  Administered 2013-07-12: 4 mg via INTRAMUSCULAR

## 2013-07-12 MED ORDER — DEXAMETHASONE SODIUM PHOSPHATE 4 MG/ML IJ SOLN
INTRAMUSCULAR | Status: DC | PRN
  Administered 2013-07-12: 8 mg via INTRAVENOUS

## 2013-07-12 MED ORDER — SODIUM CHLORIDE 0.9 % IR SOLN
Status: DC | PRN
  Administered 2013-07-12: 3000 mL

## 2013-07-12 MED ORDER — CEFAZOLIN SODIUM 1-5 GM-% IV SOLN
1.0000 g | INTRAVENOUS | Status: DC
  Filled 2013-07-12: qty 50

## 2013-07-12 MED ORDER — URIBEL 118 MG PO CAPS: 1.0000 | ORAL_CAPSULE | Freq: Three times a day (TID) | ORAL | Status: DC | PRN

## 2013-07-12 MED ORDER — LACTATED RINGERS IV SOLN
INTRAVENOUS | Status: DC
  Administered 2013-07-12 (×2): via INTRAVENOUS
  Filled 2013-07-12: qty 1000

## 2013-07-12 MED ORDER — KETOROLAC TROMETHAMINE 30 MG/ML IJ SOLN
INTRAMUSCULAR | Status: DC | PRN
  Administered 2013-07-12: 30 mg via INTRAVENOUS

## 2013-07-12 MED ORDER — FENTANYL CITRATE 0.05 MG/ML IJ SOLN
INTRAMUSCULAR | Status: DC | PRN
  Administered 2013-07-12 (×4): 25 ug via INTRAVENOUS

## 2013-07-12 MED ORDER — PROPOFOL 10 MG/ML IV BOLUS
INTRAVENOUS | Status: DC | PRN
  Administered 2013-07-12: 60 mg via INTRAVENOUS
  Administered 2013-07-12: 200 mg via INTRAVENOUS

## 2013-07-12 MED ORDER — IOHEXOL 350 MG/ML SOLN
INTRAVENOUS | Status: DC | PRN
  Administered 2013-07-12: 10 mL via INTRAVENOUS

## 2013-07-12 MED ORDER — PROMETHAZINE HCL 25 MG/ML IJ SOLN
6.2500 mg | INTRAMUSCULAR | Status: DC | PRN
  Filled 2013-07-12: qty 1

## 2013-07-12 MED ORDER — FENTANYL CITRATE 0.05 MG/ML IJ SOLN
25.0000 ug | INTRAMUSCULAR | Status: DC | PRN
  Filled 2013-07-12: qty 1

## 2013-07-12 MED ORDER — LIDOCAINE HCL 2 % EX GEL
CUTANEOUS | Status: DC | PRN
  Administered 2013-07-12: 1

## 2013-07-12 MED ORDER — LACTATED RINGERS IV SOLN
INTRAVENOUS | Status: DC | PRN
  Administered 2013-07-12: 07:00:00 via INTRAVENOUS

## 2013-07-12 SURGICAL SUPPLY — 38 items
ADAPTER CATH URET PLST 4-6FR (CATHETERS) IMPLANT
ADPR CATH URET STRL DISP 4-6FR (CATHETERS)
BAG DRAIN URO-CYSTO SKYTR STRL (DRAIN) ×2 IMPLANT
BAG DRN UROCATH (DRAIN) ×1
BASKET LASER NITINOL 1.9FR (BASKET) IMPLANT
BASKET STNLS GEMINI 4WIRE 3FR (BASKET) IMPLANT
BASKET ZERO TIP NITINOL 2.4FR (BASKET) IMPLANT
BRUSH URET BIOPSY 3F (UROLOGICAL SUPPLIES) IMPLANT
BSKT STON RTRVL 120 1.9FR (BASKET)
BSKT STON RTRVL GEM 120X11 3FR (BASKET)
BSKT STON RTRVL ZERO TP 2.4FR (BASKET)
CANISTER SUCT LVC 12 LTR MEDI- (MISCELLANEOUS) ×1 IMPLANT
CATH INTERMIT  6FR 70CM (CATHETERS) ×1 IMPLANT
CATH URET 5FR 28IN CONE TIP (BALLOONS)
CATH URET 5FR 28IN OPEN ENDED (CATHETERS) IMPLANT
CATH URET 5FR 70CM CONE TIP (BALLOONS) IMPLANT
CLOTH BEACON ORANGE TIMEOUT ST (SAFETY) ×2 IMPLANT
DRAPE CAMERA CLOSED 9X96 (DRAPES) ×1 IMPLANT
ELECT REM PT RETURN 9FT ADLT (ELECTROSURGICAL)
ELECTRODE REM PT RTRN 9FT ADLT (ELECTROSURGICAL) IMPLANT
GLOVE BIO SURGEON STRL SZ7.5 (GLOVE) ×2 IMPLANT
GLOVE SS BIOGEL STRL SZ 6.5 (GLOVE) IMPLANT
GLOVE SUPERSENSE BIOGEL SZ 6.5 (GLOVE) ×1
GOWN PREVENTION PLUS LG XLONG (DISPOSABLE) ×2 IMPLANT
GOWN STRL REIN XL XLG (GOWN DISPOSABLE) ×2 IMPLANT
GUIDEWIRE 0.038 PTFE COATED (WIRE) IMPLANT
GUIDEWIRE ANG ZIPWIRE 038X150 (WIRE) IMPLANT
GUIDEWIRE STR DUAL SENSOR (WIRE) ×2 IMPLANT
IV NS IRRIG 3000ML ARTHROMATIC (IV SOLUTION) ×4 IMPLANT
KIT BALLIN UROMAX 15FX10 (LABEL) IMPLANT
KIT BALLN UROMAX 15FX4 (MISCELLANEOUS) IMPLANT
KIT BALLN UROMAX 26 75X4 (MISCELLANEOUS)
PACK CYSTOSCOPY (CUSTOM PROCEDURE TRAY) ×2 IMPLANT
SET HIGH PRES BAL DIL (LABEL)
SHEATH ACCESS URETERAL 38CM (SHEATH) IMPLANT
SHEATH ACCESS URETERAL 54CM (SHEATH) IMPLANT
STENT URET 6FRX26 CONTOUR (STENTS) ×1 IMPLANT
SYRINGE IRR TOOMEY STRL 70CC (SYRINGE) IMPLANT

## 2013-07-12 NOTE — Anesthesia Postprocedure Evaluation (Signed)
  Anesthesia Post-op Note  Patient: [name] Short  Procedure(s) Performed: Procedure(s) (LRB): CYSTOSCOPY WITH LITHOLAPEXY, LEFT URETERAL STENT PLACEMENT (Left)  Patient Location: PACU  Anesthesia Type: General  Level of Consciousness: awake and alert   Airway and Oxygen Therapy: Patient Spontanous Breathing  Post-op Pain: mild  Post-op Assessment: Post-op Vital signs reviewed, Patient's Cardiovascular Status Stable, Respiratory Function Stable, Patent Airway and No signs of Nausea or vomiting  Last Vitals:  Filed Vitals:   07/12/13 0900  BP: 113/60  Pulse: 55  Temp:   Resp: 13    Post-op Vital Signs: stable   Complications: No apparent anesthesia complications

## 2013-07-12 NOTE — H&P (Signed)
Urology Admission H&P  History of Present Illness: Pt with gross hematuria and bilateral stones. Large RLP stone, Left renal pelvic stone and smaller LLP stones. He has continued left intermittent flank pain. He has been well with no fever or dysuria.   Past Medical History  Diagnosis Date  . History of kidney stones     hasn't had any since the 1990's  . Complication of anesthesia     slow to wake  . History of concussion     AS CHILD--  NO RESIDUAL  . Arthritis     WRIST  . History of gout   . Hyperlipidemia   . Bladder stone   . Nephrolithiasis     LEFT  . PAF (paroxysmal atrial fibrillation)     EPISODE --  2011  FOLLOW-UP W/ DR WALL  /  HAS NOT SEEN ANY CARDIOLOGIST SINCE  . Hematuria    Past Surgical History  Procedure Laterality Date  . 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 Total hip arthroplasty Right 11/15/2012    Procedure: RIGHT TOTAL HIP ARTHROPLASTY ANTERIOR APPROACH;  Surgeon: Mark C Yates, MD;  Location: [facility] OR;  Service: Orthopedics;  Laterality: Right;  Right total hip arthroplasty  . Appendectomy  1983  . Laparoscopic inguinal hernia repair Bilateral 09-04-2010    w/ mesh  . Extracorporeal shock wave lithotripsy  X2  . Transthoracic echocardiogram  05-08-2010    NORMAL LVSF/  EF 55%/  GRADE I DIASTOLIC DYSFUNCTION/  MILD BILATERAL ATRIUM ENLARGEMENT  . Cardiovascular stress test  05-08-2010  DR WALL    NO EVIDENCE OF SCAR OR ISCHEMIA/ EF 54%/  PT HAD BOTH AFIB/ AFLUTTER DURING STUDY  . Tonsillectomy  AS CHILD    Home Medications:  Prescriptions prior to admission  Medication Sig Dispense Refill  . aspirin 325 MG EC tablet Take 325 mg by mouth daily.      . Calcium Carbonate-Vitamin D (CALCIUM 600 + D PO) Take 1 tablet by mouth daily.      . fenofibrate 160 MG tablet Take 1 tablet by mouth  daily.  90 tablet  0  . fish oil-omega-3 fatty acids 1000 MG capsule Take 1 g by mouth daily.      . HYDROcodone-acetaminophen (NORCO/VICODIN) 5-325 MG per tablet  Take 1-2 tablets by mouth every 6 (six) hours as needed for pain.       . ibuprofen (ADVIL,MOTRIN) 200 MG tablet Take 400 mg by mouth 3 (three) times daily as needed. For pain      . Multiple Vitamin (MULTIVITAMIN WITH MINERALS) TABS Take 1 tablet by mouth daily.      . naproxen sodium (ANAPROX) 220 MG tablet Take 440 mg by mouth 2 (two) times daily as needed. For pain       Allergies:  Allergies  Allergen Reactions  . Warfarin Sodium Rash    History reviewed. No pertinent family history. Social History:  reports that he has been smoking Cigars.  He has never used smokeless tobacco. He reports that he drinks about 1.2 ounces of alcohol per week. He reports that he does not use illicit drugs.  Review of Systems  All other systems reviewed and are negative.    Physical Exam:  Vital signs in last 24 hours: Temp:  [97.5 F (36.4 C)] 97.5 F (36.4 C) (10/14 0632) Pulse Rate:  [58] 58 (10/14 0632) Resp:  [16] 16 (10/14 0632) BP: (132)/(79) 132/79 mmHg (10/14 0632) SpO2:  [97 %] 97 % (10/14 0632)   Weight:  [81.647 kg (180 lb)] 81.647 kg (180 lb) (10/14 0632) Physical Exam NAD CV - RRR Lungs - reg effort, depth Abd- soft , NT Ext - no CCE  Laboratory Data:  No results found for this or any previous visit (from the past 24 hour(s)). No results found for this or any previous visit (from the past 240 hour(s)). Creatinine: No results found for this basename: CREATININE,  in the last 168 hours I reviewed CT and KUB images.   Impression/Assessment:  RLP stone Left renal pelvic stone LLP stone Left flank pain Hematuria  Discussed again with patient and wife stone burden in both kidneys is significant. The Left renal pelvic stone seems to be causing most issues and has potential for problems such as left renal obstruction, so we decided to start on the left. Discussed again nature, R/B of surveillance, endoscopy, ESWL and PCNL. Discussed PCNL advantage or removing left renal pelvic  and LP stones. He wants to continue with plan for ESWL and we discussed issues with failure to fragement or pass and need for multiple procedures. He had ESWL in the past but developed obstruction from fragments, therefore he wants to proceed with planned sent and ESWL plan.  Plan: I discussed with the patient the nature, potential benefits, risks and alternatives to cystoscopy left ureteral stent placement, including side effects of the proposed treatment, the likelihood of the patient achieving the goals of the procedure, and any potential problems that might occur during the procedure or recuperation. All questions answered. Patient elects to proceed. Will proceed with ESWL in 2 weeks.   Gottlieb Zuercher Ramsey 07/12/2013, 7:38 AM       

## 2013-07-12 NOTE — Anesthesia Procedure Notes (Addendum)
Procedure Name: LMA Insertion Date/Time: 07/12/2013 7:47 AM Performed by: Jessica Priest Pre-anesthesia Checklist: Patient identified, Emergency Drugs available, Suction available and Patient being monitored Patient Re-evaluated:Patient Re-evaluated prior to inductionOxygen Delivery Method: Circle System Utilized Preoxygenation: Pre-oxygenation with 100% oxygen Intubation Type: IV induction Ventilation: Mask ventilation without difficulty LMA: LMA with gastric port inserted LMA Size: 4.0 Number of attempts: 1 Placement Confirmation: positive ETCO2 Tube secured with: Tape Dental Injury: Teeth and Oropharynx as per pre-operative assessment    Procedure Name: LMA Insertion Date/Time: 07/12/2013 7:49 AM Performed by: Jessica Priest Pre-anesthesia Checklist: Patient identified, Emergency Drugs available, Suction available and Patient being monitored Patient Re-evaluated:Patient Re-evaluated prior to inductionOxygen Delivery Method: Circle System Utilized Preoxygenation: Pre-oxygenation with 100% oxygen Intubation Type: IV induction Ventilation: Mask ventilation without difficulty LMA: LMA inserted LMA Size: 5.0 Number of attempts: 1 Airway Equipment and Method: bite block Placement Confirmation: positive ETCO2 Tube secured with: Tape Dental Injury: Teeth and Oropharynx as per pre-operative assessment

## 2013-07-12 NOTE — Op Note (Signed)
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 There was clear bilateral reflux. The bladder mucosa was normal without tumor or erythema. There were no foreign bodies or stones in the bladder.   Left retrograde pyelogram - on scout imaging there was a large calcification noted in the left renal pelvis and 2 smaller calcifications in the area the left lower pole. These areas were somewhat obscured by bowel gas. After retrograde injection of contrast the ureter appeared normal without filling defect, stricture or dilation. There was a filling defect in the renal pelvis consistent with the large stone. There was no significant dilation of the collecting system. There was a single ureter single collecting system unit and no other filling defects in the collecting system.   Description of procedure: After consent was obtained the patient was brought to the operating room. After adequate anesthesia he is placed in lithotomy position and prepped and draped in the usual sterile fashion. A timeout was performed to confirm the patient and procedure. Cystoscope was passed per urethra and the bladder examined with a 12 and 70 lens.   Next a 6 [place] open-ended catheter was used to cannulate the left ureteral orifice and left retrograde injection of contrast was performed.  A sensor wire was then advanced and coiled in the collecting system over which a 6 x 26 cm ureteral stent was advanced. The wire was removed and a good coil noted in the collecting system fluoroscopically and a good coil in the bladder cystoscopically. The bladder  was drained and the scope removed. The urethra was filled with lidocaine jelly.  The patient was awakened and taken to the recovery room in stable condition.  Complications: None Specimens: None Blood loss: Minimal  Drains: 6 x 26 cm left ureteral stent  Disposition: Patient stable to PACU

## 2013-07-12 NOTE — Transfer of Care (Signed)
Immediate Anesthesia Transfer of Care Note  Patient: [name] Short  Procedure(s) Performed: Procedure(s) (LRB): CYSTOSCOPY WITH LITHOLAPEXY, LEFT URETERAL STENT PLACEMENT (Left)  Patient Location: PACU  Anesthesia Type: General  Level of Consciousness: awake, sedated, patient cooperative and responds to stimulation  Airway & Oxygen Therapy: Patient Spontanous Breathing and Patient connected to face mask oxygen  Post-op Assessment: Report given to PACU RN, Post -op Vital signs reviewed and stable and Patient moving all extremities  Post vital signs: Reviewed and stable  Complications: No apparent anesthesia complications

## 2013-07-13 ENCOUNTER — Encounter (HOSPITAL_BASED_OUTPATIENT_CLINIC_OR_DEPARTMENT_OTHER): Payer: Self-pay | Admitting: Urology

## 2013-07-26 ENCOUNTER — Encounter (HOSPITAL_COMMUNITY): Payer: Self-pay | Admitting: *Deleted

## 2013-07-28 ENCOUNTER — Encounter (HOSPITAL_COMMUNITY): Payer: Self-pay | Admitting: *Deleted

## 2013-07-28 ENCOUNTER — Ambulatory Visit (HOSPITAL_COMMUNITY): Payer: Medicare Other

## 2013-07-28 ENCOUNTER — Ambulatory Visit (HOSPITAL_COMMUNITY)
Admission: RE | Admit: 2013-07-28 | Discharge: 2013-07-28 | Disposition: A | Discharge status: Home / Self Care | Payer: Medicare Other | Source: Ambulatory Visit | Attending: Urology | Admitting: Urology

## 2013-07-28 ENCOUNTER — Encounter (HOSPITAL_COMMUNITY)
Admission: RE | Disposition: A | Discharge status: Home / Self Care | Payer: Self-pay | Source: Ambulatory Visit | Attending: Urology

## 2013-07-28 DIAGNOSIS — N200 Calculus of kidney: Secondary | ICD-10-CM | POA: Insufficient documentation

## 2013-07-28 DIAGNOSIS — F17200 Nicotine dependence, unspecified, uncomplicated: Secondary | ICD-10-CM | POA: Diagnosis not present

## 2013-07-28 DIAGNOSIS — E785 Hyperlipidemia, unspecified: Secondary | ICD-10-CM | POA: Diagnosis not present

## 2013-07-28 SURGERY — LITHOTRIPSY, ESWL
Anesthesia: LOCAL | Laterality: Left

## 2013-07-28 MED ORDER — DIAZEPAM 5 MG PO TABS
10.0000 mg | ORAL_TABLET | ORAL | Status: AC
  Administered 2013-07-28: 10 mg via ORAL
  Filled 2013-07-28: qty 2

## 2013-07-28 MED ORDER — IBUPROFEN 200 MG PO TABS: 400.0000 mg | ORAL_TABLET | Freq: Three times a day (TID) | ORAL | Status: DC | PRN

## 2013-07-28 MED ORDER — ASPIRIN 325 MG PO TBEC: 325.0000 mg | DELAYED_RELEASE_TABLET | Freq: Every day | ORAL | Status: DC

## 2013-07-28 MED ORDER — CIPROFLOXACIN HCL 500 MG PO TABS
500.0000 mg | ORAL_TABLET | ORAL | Status: AC
  Administered 2013-07-28: 500 mg via ORAL
  Filled 2013-07-28: qty 1

## 2013-07-28 MED ORDER — OXYCODONE-ACETAMINOPHEN 5-325 MG PO TABS
1.0000 | ORAL_TABLET | Freq: Once | ORAL | Status: AC
  Administered 2013-07-28: 1 via ORAL
  Filled 2013-07-28: qty 1

## 2013-07-28 MED ORDER — TAMSULOSIN HCL 0.4 MG PO CAPS: 0.4000 mg | ORAL_CAPSULE | Freq: Every day | ORAL | Status: DC

## 2013-07-28 MED ORDER — SODIUM CHLORIDE 0.9 % IV SOLN
INTRAVENOUS | Status: DC
  Administered 2013-07-28: 07:00:00 via INTRAVENOUS

## 2013-07-28 MED ORDER — NAPROXEN SODIUM 220 MG PO TABS: 440.0000 mg | ORAL_TABLET | Freq: Two times a day (BID) | ORAL | Status: DC | PRN

## 2013-07-28 MED ORDER — DIPHENHYDRAMINE HCL 25 MG PO CAPS
25.0000 mg | ORAL_CAPSULE | ORAL | Status: AC
  Administered 2013-07-28: 25 mg via ORAL
  Filled 2013-07-28: qty 1

## 2013-07-28 NOTE — H&P (View-Only) (Signed)
Urology Admission H&P  History of Present Illness: Pt with gross hematuria and bilateral stones. Large RLP stone, Left renal pelvic stone and smaller LLP stones. He has continued left intermittent flank pain. He has been well with no fever or dysuria.   Past Medical History  Diagnosis Date  . History of kidney stones     hasn't had any since the 1990's  . Complication of anesthesia     slow to wake  . History of concussion     AS CHILD--  NO RESIDUAL  . Arthritis     WRIST  . History of gout   . Hyperlipidemia   . Bladder stone   . Nephrolithiasis     LEFT  . PAF (paroxysmal atrial fibrillation)     EPISODE --  2011  FOLLOW-UP W/ DR WALL  /  HAS NOT SEEN ANY CARDIOLOGIST SINCE  . Hematuria    Past Surgical History  Procedure Laterality Date  . 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 Total hip arthroplasty Right 11/15/2012    Procedure: RIGHT TOTAL HIP ARTHROPLASTY ANTERIOR APPROACH;  Surgeon: Eldred Manges, MD;  Location: [facility] OR;  Service: Orthopedics;  Laterality: Right;  Right total hip arthroplasty  . Appendectomy  1983  . Laparoscopic inguinal hernia repair Bilateral 09-04-2010    w/ mesh  . Extracorporeal shock wave lithotripsy  X2  . Transthoracic echocardiogram  05-08-2010    NORMAL LVSF/  EF 55%/  GRADE I DIASTOLIC DYSFUNCTION/  MILD BILATERAL ATRIUM ENLARGEMENT  . Cardiovascular stress test  05-08-2010  DR WALL    NO EVIDENCE OF SCAR OR ISCHEMIA/ EF 54%/  PT HAD BOTH AFIB/ AFLUTTER DURING STUDY  . Tonsillectomy  AS CHILD    Home Medications:  Prescriptions prior to admission  Medication Sig Dispense Refill  . aspirin 325 MG EC tablet Take 325 mg by mouth daily.      . Calcium Carbonate-Vitamin D (CALCIUM 600 + D PO) Take 1 tablet by mouth daily.      . fenofibrate 160 MG tablet Take 1 tablet by mouth  daily.  90 tablet  0  . fish oil-omega-3 fatty acids 1000 MG capsule Take 1 g by mouth daily.      Marland Kitchen HYDROcodone-acetaminophen (NORCO/VICODIN) 5-325 MG per tablet  Take 1-2 tablets by mouth every 6 (six) hours as needed for pain.       Marland Kitchen ibuprofen (ADVIL,MOTRIN) 200 MG tablet Take 400 mg by mouth 3 (three) times daily as needed. For pain      . Multiple Vitamin (MULTIVITAMIN WITH MINERALS) TABS Take 1 tablet by mouth daily.      . naproxen sodium (ANAPROX) 220 MG tablet Take 440 mg by mouth 2 (two) times daily as needed. For pain       Allergies:  Allergies  Allergen Reactions  . Warfarin Sodium Rash    History reviewed. No pertinent family history. Social History:  reports that he has been smoking Cigars.  He has never used smokeless tobacco. He reports that he drinks about 1.2 ounces of alcohol per week. He reports that he does not use illicit drugs.  Review of Systems  All other systems reviewed and are negative.    Physical Exam:  Vital signs in last 24 hours: Temp:  [97.5 F (36.4 C)] 97.5 F (36.4 C) (10/14 0632) Pulse Rate:  [58] 58 (10/14 0632) Resp:  [16] 16 (10/14 0632) BP: (132)/(79) 132/79 mmHg (10/14 0632) SpO2:  [97 %] 97 % (10/14 1610)  Weight:  [81.647 kg (180 lb)] 81.647 kg (180 lb) (10/14 1610) Physical Exam NAD CV - RRR Lungs - reg effort, depth Abd- soft , NT Ext - no CCE  Laboratory Data:  No results found for this or any previous visit (from the past 24 hour(s)). No results found for this or any previous visit (from the past 240 hour(s)). Creatinine: No results found for this basename: CREATININE,  in the last 168 hours I reviewed CT and KUB images.   Impression/Assessment:  RLP stone Left renal pelvic stone LLP stone Left flank pain Hematuria  Discussed again with patient and wife stone burden in both kidneys is significant. The Left renal pelvic stone seems to be causing most issues and has potential for problems such as left renal obstruction, so we decided to start on the left. Discussed again nature, R/B of surveillance, endoscopy, ESWL and PCNL. Discussed PCNL advantage or removing left renal pelvic  and LP stones. He wants to continue with plan for ESWL and we discussed issues with failure to fragement or pass and need for multiple procedures. He had ESWL in the past but developed obstruction from fragments, therefore he wants to proceed with planned sent and ESWL plan.  Plan: I discussed with the patient the nature, potential benefits, risks and alternatives to cystoscopy left ureteral stent placement, including side effects of the proposed treatment, the likelihood of the patient achieving the goals of the procedure, and any potential problems that might occur during the procedure or recuperation. All questions answered. Patient elects to proceed. Will proceed with ESWL in 2 weeks.   [name] Short 07/12/2013, 7:38 AM

## 2013-07-28 NOTE — Interval H&P Note (Signed)
History and Physical Interval Note:  07/28/2013 7:46 AM  [name] Short  has presented today for surgery, with the diagnosis of Left Nephrolithiasis, Hematuria  The various methods of treatment have been discussed with the patient and family. After consideration of risks, benefits and other options for treatment, the patient has consented to  Procedure(s): LEFT EXTRACORPOREAL SHOCK WAVE LITHOTRIPSY (ESWL) (Left) as a surgical intervention .  The patient's history has been reviewed, patient examined, no change in status, stable for surgery.  I have reviewed the patient's chart and labs.  Patient without fever. Feels well. Played tennis and raked leaves. Mild dysuria and left flank pain c/w stent pain. Questions were answered to the patient's satisfaction.     Antony Haste

## 2013-07-28 NOTE — Progress Notes (Signed)
Patient stated pain 7/10 in left flank area. Called MD and order received for pain meds.

## 2013-07-28 NOTE — Op Note (Signed)
See scanned [facility] stone center Op note

## 2013-08-11 DIAGNOSIS — R3129 Other microscopic hematuria: Secondary | ICD-10-CM | POA: Diagnosis not present

## 2013-08-11 DIAGNOSIS — N200 Calculus of kidney: Secondary | ICD-10-CM | POA: Diagnosis not present

## 2013-08-12 ENCOUNTER — Other Ambulatory Visit: Payer: Self-pay | Admitting: Urology

## 2013-08-12 ENCOUNTER — Encounter (HOSPITAL_COMMUNITY): Payer: Self-pay | Admitting: Pharmacy Technician

## 2013-08-23 ENCOUNTER — Encounter (HOSPITAL_COMMUNITY): Payer: Self-pay | Admitting: *Deleted

## 2013-08-27 NOTE — H&P (Signed)
History of Present Illness    F/u -     1- nephrolithiasis:   -Aug 2014 - L>R flank pain, emesis, sweats, brown urine, U/A TNTC rbc's; prior ESWL; prior labs Feb 2014 : bun 23, cr 1.2, UA some rbc's and wbc's   normal exam/DRE; no LUTS    KUB - 17 mm stone in left renal pelvis, left lower pole stones 7 mm x2; right lower pole stones about 18 mm in length.; Bladder stones  CT abdomen and pelvis - confirms large left renal pelvic stone, smaller lower pole stones. Also right lower pole stones. 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    2-gross hematuria - associated with above      Nov 2014 Interval Hx  He returns and feels well. he played tennis last night and had some hematuria but is voiding without difficulty. No RIGHT flank pain. No fever.     KUB today - normal bone and bowel gas pattern.      Past Medical History Problems  1. History of Arthritis (V13.4) 2. History of Atrial fibrillation (427.31) 3. History of Gout (274.9) 4. History of hypercholesterolemia (V12.29) 5. History of kidney stones (V13.01)  Surgical History Problems  1. History of Appendectomy 2. History of Cystoscopy With Insertion Of Ureteral Stent Left 3. History of Hernia Repair 4. History of Hip Surgery 5. History of Hip Surgery 6. History of Lithotripsy 7. History of Lithotripsy 8. History of Tonsillectomy  Current Meds 1. Aspirin 325 MG Oral Tablet;  Therapy: (Recorded:06Aug2014) to Recorded 2. Calcium + D TABS;  Therapy: (Recorded:06Aug2014) to Recorded 3. Fenofibrate 160 MG Oral Tablet;  Therapy: 27Jun2014 to Recorded 4. Fish Oil CAPS;  Therapy: (Recorded:06Aug2014) to Recorded 5. Ibuprofen TABS;  Therapy: (Recorded:06Aug2014) to Recorded 6. Multi-Vitamin TABS;  Therapy: (Recorded:06Aug2014) to Recorded 7. Naproxen Sodium TABS;  Therapy: (Recorded:06Aug2014) to Recorded 8. OxyCODONE HCl CAPS;  Therapy: (Recorded:13Nov2014) to  Recorded  Allergies Medication  1. Coumadin TABS  Family History Problems  1. Family history of Alzheimer's Disease : Mother 2. Family history of Arthritis (V17.7) : Mother 3. Family history of Death In The Family Mother 4. Family history of Diabetes Mellitus (V18.0) : Sister 5. Family history of Family Health Status Number Of Children   2 daughters 6. Family history of Family Health Status Of Father - Alive 7. Family history of Prostate Cancer (Z61.09) : Father  Social History Problems  1. Alcohol Use   2-6 per wk 2. Caffeine Use   4-6 per day 3. Cigars (1  A Day) (305.1)   1-3 per week 4. Marital History - Currently Married 5. Retired From Work  Review of Systems Constitutional, skin, eye, otolaryngeal, hematologic/lymphatic, cardiovascular, pulmonary, endocrine, musculoskeletal, gastrointestinal, neurological and psychiatric system(s) were reviewed and pertinent findings if present are noted.    Vitals Vital Signs [Data Includes: Last 1 Day]  Recorded: 13Nov2014 08:27AM  Blood Pressure: 119 / 70 Temperature: 97.4 F Heart Rate: 75  Physical Exam Constitutional: Well nourished and well developed . No acute distress.  Pulmonary: No respiratory distress and normal respiratory rhythm and effort.  Cardiovascular: Heart rate and rhythm are normal . No peripheral edema.  Neuro/Psych:. Mood and affect are appropriate.    Results/Data Urine [Data Includes: Last 1 Day]   13Nov2014  COLOR RED   APPEARANCE CLOUDY   SPECIFIC GRAVITY 1.030   pH 5.5   GLUCOSE NEG mg/dL  BILIRUBIN SMALL   KETONE NEG mg/dL  BLOOD LARGE   PROTEIN 100 mg/dL  UROBILINOGEN 0.2 mg/dL  NITRITE NEG   LEUKOCYTE ESTERASE MOD   SQUAMOUS EPITHELIAL/HPF NONE SEEN   WBC 3-6 WBC/hpf  RBC TNTC RBC/hpf  BACTERIA FEW   CRYSTALS NONE SEEN   CASTS NONE SEEN    Assessment Assessed  1. Nephrolithiasis (592.0)  End of Encounter Meds  Medication Name Instruction  Aspirin 325 MG Oral Tablet    Calcium + D TABS   Fenofibrate 160 MG Oral Tablet   Fish Oil CAPS   Ibuprofen TABS   Multi-Vitamin TABS   Naproxen Sodium TABS   OxyCODONE HCl CAPS   Oxycodone-Acetaminophen 5-325 MG Oral Tablet TAKE 1 TO 2 TABLETS EVERY 4 TO 6 HOURS AS NEEDED FOR PAIN.   Plan Health Maintenance  1. UA With REFLEX; Status:Resulted - Requires Verification;   Done: 13Nov2014 07:54AM Microscopic hematuria  2. URINE CULTURE; Status:Hold For - Specimen/Data Collection,Appointment; Requested  for:13Nov2014;  Nephrolithiasis  3. Start: Oxycodone-Acetaminophen 5-325 MG Oral Tablet; TAKE 1 TO 2 TABLETS EVERY 4  TO 6 HOURS AS NEEDED FOR PAIN 4. Follow-up Schedule Surgery Office  Follow-up  Status: Hold For - Appointment   Requested for: 13Nov2014  Discussion/Summary Discussed nature, R/B of removing stent today, ESWL to LLP fragments, left PCNL, bilateral URS (approach left fragments and right stones). All questions answered. he elects to proceed with left ESWL. Discussed we need to consider URS next to poss clear left and approach right stones.        Pain meds refilled. Urine sent for Cx as a precaution. Discussed to notify me of any right sided pain.     Signatures Electronically signed by : Jerilee Field, M.D.; Aug 11 2013  9:16AM EST

## 2013-08-29 ENCOUNTER — Encounter (HOSPITAL_COMMUNITY)
Admission: RE | Disposition: A | Discharge status: Home / Self Care | Payer: Self-pay | Source: Ambulatory Visit | Attending: Urology

## 2013-08-29 ENCOUNTER — Encounter (HOSPITAL_COMMUNITY): Payer: Self-pay | Admitting: General Practice

## 2013-08-29 ENCOUNTER — Ambulatory Visit (HOSPITAL_COMMUNITY): Payer: Medicare Other

## 2013-08-29 ENCOUNTER — Ambulatory Visit (HOSPITAL_COMMUNITY)
Admission: RE | Admit: 2013-08-29 | Discharge: 2013-08-29 | Disposition: A | Discharge status: Home / Self Care | Payer: Medicare Other | Source: Ambulatory Visit | Attending: Urology | Admitting: Urology

## 2013-08-29 DIAGNOSIS — F17200 Nicotine dependence, unspecified, uncomplicated: Secondary | ICD-10-CM | POA: Diagnosis not present

## 2013-08-29 DIAGNOSIS — Z7982 Long term (current) use of aspirin: Secondary | ICD-10-CM | POA: Diagnosis not present

## 2013-08-29 DIAGNOSIS — N200 Calculus of kidney: Secondary | ICD-10-CM | POA: Diagnosis not present

## 2013-08-29 DIAGNOSIS — R310 Gross hematuria: Secondary | ICD-10-CM | POA: Insufficient documentation

## 2013-08-29 DIAGNOSIS — Z79899 Other long term (current) drug therapy: Secondary | ICD-10-CM | POA: Insufficient documentation

## 2013-08-29 DIAGNOSIS — I4891 Unspecified atrial fibrillation: Secondary | ICD-10-CM | POA: Diagnosis not present

## 2013-08-29 DIAGNOSIS — Z01818 Encounter for other preprocedural examination: Secondary | ICD-10-CM | POA: Diagnosis not present

## 2013-08-29 SURGERY — LITHOTRIPSY, ESWL
Anesthesia: LOCAL | Laterality: Left

## 2013-08-29 MED ORDER — DIAZEPAM 5 MG PO TABS
10.0000 mg | ORAL_TABLET | ORAL | Status: AC
  Administered 2013-08-29: 10 mg via ORAL
  Filled 2013-08-29: qty 2

## 2013-08-29 MED ORDER — CIPROFLOXACIN HCL 500 MG PO TABS
500.0000 mg | ORAL_TABLET | ORAL | Status: AC
  Administered 2013-08-29: 500 mg via ORAL
  Filled 2013-08-29: qty 1

## 2013-08-29 MED ORDER — DIPHENHYDRAMINE HCL 25 MG PO CAPS
25.0000 mg | ORAL_CAPSULE | ORAL | Status: AC
  Administered 2013-08-29: 25 mg via ORAL
  Filled 2013-08-29: qty 1

## 2013-08-29 MED ORDER — NAPROXEN SODIUM 220 MG PO TABS: 440.0000 mg | ORAL_TABLET | Freq: Two times a day (BID) | ORAL | Status: DC | PRN

## 2013-08-29 MED ORDER — SODIUM CHLORIDE 0.9 % IV SOLN
INTRAVENOUS | Status: DC
  Administered 2013-08-29: 11:00:00 via INTRAVENOUS

## 2013-08-29 MED ORDER — ASPIRIN 325 MG PO TBEC: 325.0000 mg | DELAYED_RELEASE_TABLET | Freq: Every day | ORAL | Status: DC

## 2013-08-29 MED ORDER — IBUPROFEN 200 MG PO TABS: 400.0000 mg | ORAL_TABLET | Freq: Three times a day (TID) | ORAL | Status: DC | PRN

## 2013-08-29 NOTE — Discharge Instructions (Signed)
Lithotripsy °Care After °Refer to this sheet for the next few weeks. These discharge instructions provide you with general information on caring for yourself after you leave the hospital. Your caregiver may also give you specific instructions. Your treatment has been planned according to the most current medical practices available, but unavoidable complications sometimes occur. If you have any problems or questions after discharge, please call your caregiver. °AFTER THE PROCEDURE  °· The recovery time will vary with the procedure done. °· You will be taken to the recovery area. A nurse will watch and check your progress. Once you are awake, stable, and taking fluids well, you will be allowed to go home as long as there are no problems. °· Your urine may have a red tinge for a few days after treatment. Blood loss is usually minimal. °· You may have soreness in the back or flank area. This usually goes away after a few days. The procedure can cause blotches or bruises on the back where the pressure wave enters the skin. These marks usually cause only minimal discomfort and should disappear in a short time. °· Stone fragments should begin to pass within 24 hours of treatment. However, a delayed passage is not unusual. °· You may have pain, discomfort, and feel sick to your stomach (nauseous) when the crushed (pulverized) fragments of stone are passed down the tube from the kidney to the bladder. Stone fragments can pass soon after the procedure and may last for up to 4 to 8 weeks. °· A small number of patients may have severe pain when stone fragments are not able to pass, which leads to an obstruction. °· If your stone is greater than 1 inch/2.5 centimeters in diameter or if you have multiple stones that have a combined diameter greater than 1 inch/2.5 centimeters, you may require more than 1 treatment. °· You must have someone drive you home. °HOME CARE INSTRUCTIONS  °· Rest at home until you feel your energy  improving. °· Only take over-the-counter or prescription medicines for pain, discomfort, or fever as directed by your caregiver. Depending on the type of lithotripsy, you may need to take medicines (antibiotics) that kill germs and anti-inflammatory medicines for a few days. °· Drink enough water and fluids to keep your urine clear or pale yellow. This helps "flush" your kidneys. It helps pass any remaining pieces of stone and prevents stones from coming back. °· Most people can resume daily activities within 1 or 2 days after standard lithotripsy. It can take longer to recover from laser and percutaneous lithotripsy. °· If the stones are in your urinary system, you may be asked to strain your urine at home to look for stones. Any stones that are found can be sent to a medical lab for examination. °· Visit your caregiver for a follow-up appointment in a few weeks. Your doctor may remove your stent if you have one. Your caregiver will also check to see whether stone particles still remain. °SEEK MEDICAL CARE IF:  °· Your pain is not relieved by medicine. °· You have a lasting nauseous feeling. °· You feel there is too much blood in the urine. °· You develop persistent problems with frequent and/or painful urination that does not at least partially improve after 2 days following the procedure. °· You have a congested cough. °· You feel lightheaded. °· You develop a rash or any other signs that might suggest an allergic problem. °· You develop any reaction or side effects to your medicine(s). °  SEEK IMMEDIATE MEDICAL CARE IF:  °· You experience severe back and/or flank pain. °· You see nothing but blood when you urinate. °· You cannot pass any urine at all. °· You have a fever. °· You develop shortness of breath, difficulty breathing, or chest pain. °· You develop vomiting that will not stop after 6 to 8 hours. °· You have a fainting episode. °MAKE SURE YOU:  °· Understand these instructions. °· Will watch your  condition. °· Will get help right away if you are not doing well or get worse. °Document Released: 10/05/2007 Document Revised: 12/08/2011 Document Reviewed: 03/31/2013 °Exit[employer]® Patient Information ©2014 [employer]. ° °

## 2013-08-29 NOTE — Interval H&P Note (Signed)
History and Physical Interval Note:  08/29/2013 1:23 PM  [name] Short  has presented today for surgery, with the diagnosis of Left Lower Pole Fragments, Nephrolithiasis  The various methods of treatment have been discussed with the patient and family. After consideration of risks, benefits and other options for treatment, the patient has consented to  Procedure(s): LEFT EXTRACORPOREAL SHOCK WAVE LITHOTRIPSY (ESWL) (Left) as a surgical intervention .  The patient's history has been reviewed, patient examined, no change in status, stable for surgery.  I have reviewed the patient's chart and labs.  Questions were answered to the patient's satisfaction.  KUB reviewed. Largest fragments in LLP - will concentrate on this area. Pt has been well.    Antony Haste

## 2013-08-29 NOTE — Brief Op Note (Signed)
08/29/2013  See [facility] scanned op note

## 2013-09-19 ENCOUNTER — Encounter (HOSPITAL_BASED_OUTPATIENT_CLINIC_OR_DEPARTMENT_OTHER): Payer: Self-pay | Admitting: *Deleted

## 2013-09-19 ENCOUNTER — Other Ambulatory Visit: Payer: Self-pay | Admitting: Urology

## 2013-09-19 DIAGNOSIS — N200 Calculus of kidney: Secondary | ICD-10-CM | POA: Diagnosis not present

## 2013-09-19 NOTE — Progress Notes (Signed)
09/19/13 1251  OBSTRUCTIVE SLEEP APNEA  Have you ever been diagnosed with sleep apnea through a sleep study? No  Do you snore loudly (loud enough to be heard through closed doors)?  1  Do you often feel tired, fatigued, or sleepy during the daytime? 0  Has anyone observed you stop breathing during your sleep? 1  Do you have, or are you being treated for high blood pressure? 0  BMI more than 35 kg/m2? 0  Age over 66 years old? 1  Neck circumference greater than 40 cm/18 inches? 0  Gender: 1  Obstructive Sleep Apnea Score 4  Score 4 or greater  Results sent to PCP

## 2013-09-19 NOTE — H&P (Signed)
History of Present Illness         F/u -     1- nephrolithiasis:   -Aug 2014 - L>R flank pain, emesis, sweats, brown urine, U/A TNTC rbc's; prior ESWL; prior labs Feb 2014 : bun 23, cr 1.2, UA some rbc's and wbc's   normal exam/DRE; no LUTS    KUB - 17 mm stone in left renal pelvis, left lower pole stones 7 mm x2; right lower pole stones about 18 mm in length.; Bladder stones  CT abdomen and pelvis - confirms large left renal pelvic stone, smaller lower pole stones. Also right lower pole stones. 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  -Oct 14, 014 - left stent placement   -Jul 28, 2013 left ESWL - post-op fragments > 3 mm  -Aug 29, 2013 repeat left ESWL    2-gross hematuria - associated with above  -Oct 2014 - normal cysto in OR with left stent placement        Dec 2014 Interval Hx  He returns today after staged left ESWL to consider stent removal. He has been well without fever or chills. He has passed some clots, but overall voiding without difficulty. Some stent pain controlled with pain meds.     KUB today - comparison CT Aug 2014 and KUB Oct and Dec 2014 - findings: large fragment remains in LLP and smaller fragments around stent coil and along LLP infundibulum.      Past Medical History Problems  1. History of Arthritis (V13.4) 2. History of Atrial fibrillation (427.31) 3. History of Gout (274.9) 4. History of hypercholesterolemia (V12.29) 5. History of kidney stones (V13.01)  Surgical History Problems  1. History of Appendectomy 2. History of Cystoscopy With Insertion Of Ureteral Stent Left 3. History of Hernia Repair 4. History of Hip Surgery 5. History of Hip Surgery 6. History of Lithotripsy 7. History of Lithotripsy 8. History of Lithotripsy 9. History of Tonsillectomy  Current Meds 1. Aspirin 325 MG Oral Tablet;  Therapy: (Recorded:06Aug2014) to Recorded 2. Calcium + D TABS;  Therapy: (Recorded:06Aug2014) to  Recorded 3. Fenofibrate 160 MG Oral Tablet;  Therapy: 27Jun2014 to Recorded 4. Fish Oil CAPS;  Therapy: (Recorded:06Aug2014) to Recorded 5. Ibuprofen TABS;  Therapy: (Recorded:06Aug2014) to Recorded 6. Multi-Vitamin TABS;  Therapy: (Recorded:06Aug2014) to Recorded 7. Naproxen Sodium TABS;  Therapy: (Recorded:06Aug2014) to Recorded 8. Tamsulosin HCl - 0.4 MG Oral Capsule;  Therapy: 30Oct2014 to Recorded  Allergies Medication  1. Coumadin TABS  Family History Problems  1. Family history of Alzheimer's Disease : Mother 2. Family history of Arthritis (V17.7) : Mother 3. Family history of Death In The Family Mother 4. Family history of Diabetes Mellitus (V18.0) : Sister 5. Family history of Family Health Status Number Of Children   2 daughters 6. Family history of Family Health Status Of Father - Alive 7. Family history of Prostate Cancer (W09.81) : Father  Social History Problems  1. Alcohol Use   2-6 per wk 2. Caffeine Use   4-6 per day 3. Cigars (1  A Day) (305.1)   1-3 per week 4. Marital History - Currently Married 5. Retired From Work  Review of Systems Constitutional, cardiovascular, pulmonary and gastrointestinal system(s) were reviewed and pertinent findings if present are noted.    Vitals Vital Signs [Data Includes: Last 1 Day]  Recorded: 22Dec2014 10:23AM  Blood Pressure: 142 / 71 Temperature: 98.3 F Heart Rate: 80  Physical Exam Constitutional: Well nourished and well developed . No acute distress.  Pulmonary:  No respiratory distress and normal respiratory rhythm and effort.  Cardiovascular: Heart rate and rhythm are normal . No peripheral edema.  Neuro/Psych:. Mood and affect are appropriate.    Results/Data Urine [Data Includes: Last 1 Day]   22Dec2014  COLOR BROWN   APPEARANCE CLOUDY   SPECIFIC GRAVITY 1.025   pH 5.5   GLUCOSE NEG mg/dL  BILIRUBIN NEG   KETONE NEG mg/dL  BLOOD LARGE   PROTEIN 100 mg/dL  UROBILINOGEN 0.2 mg/dL  NITRITE  NEG   LEUKOCYTE ESTERASE SMALL   SQUAMOUS EPITHELIAL/HPF RARE   WBC 7-10 WBC/hpf  RBC TNTC RBC/hpf  BACTERIA FEW   CRYSTALS NONE SEEN   CASTS NONE SEEN   Other AMORPHOUS NOTED    Assessment Assessed  1. Nephrolithiasis (592.0)  Plan Health Maintenance  1. UA With REFLEX; [Do Not Release]; Status:Complete;   Done: 22Dec2014 09:41AM Nephrolithiasis  2. Start: Oxycodone-Acetaminophen 5-325 MG Oral Tablet; TAKE 1 TO 2 TABLETS EVERY 4  TO 6 HOURS AS NEEDED FOR PAIN 3. Follow-up Schedule Surgery Office  Follow-up  Status: Hold For - Appointment   Requested for: 22Dec2014 4. URINE CULTURE; Status:Hold For - Specimen/Data Collection,Appointment; Requested  for:22Dec2014;   Discussion/Summary    Discussed nature, R/B of simply removing stent today to see how much he might pass over time, left URS or left PCNL. All questions answered. He elects to proceed with ureteroscopy. I'll try and clear as much as possible and laser any larger fragments. Hopefully we can leave a new stent with a string only.     Urine sent for cx. Pain med refilled.        Signatures Electronically signed by : Jerilee Field, M.D.; Sep 19 2013 11:08AM EST

## 2013-09-19 NOTE — Progress Notes (Signed)
SPOKE W/ WIFE. NPO  AFTER MN . ARRIVE AT 0715. NEEDS HG. CURRENT EKG IN EPIC AND CHART.

## 2013-09-20 ENCOUNTER — Encounter (HOSPITAL_BASED_OUTPATIENT_CLINIC_OR_DEPARTMENT_OTHER): Payer: Self-pay | Admitting: *Deleted

## 2013-09-20 ENCOUNTER — Encounter (HOSPITAL_BASED_OUTPATIENT_CLINIC_OR_DEPARTMENT_OTHER)
Admission: RE | Disposition: A | Discharge status: Home / Self Care | Payer: Self-pay | Source: Ambulatory Visit | Attending: Urology

## 2013-09-20 ENCOUNTER — Encounter (HOSPITAL_BASED_OUTPATIENT_CLINIC_OR_DEPARTMENT_OTHER): Payer: Medicare Other | Admitting: Anesthesiology

## 2013-09-20 ENCOUNTER — Ambulatory Visit (HOSPITAL_BASED_OUTPATIENT_CLINIC_OR_DEPARTMENT_OTHER): Payer: Medicare Other | Admitting: Anesthesiology

## 2013-09-20 ENCOUNTER — Ambulatory Visit (HOSPITAL_BASED_OUTPATIENT_CLINIC_OR_DEPARTMENT_OTHER)
Admission: RE | Admit: 2013-09-20 | Discharge: 2013-09-20 | Disposition: A | Discharge status: Home / Self Care | Payer: Medicare Other | Source: Ambulatory Visit | Attending: Urology | Admitting: Urology

## 2013-09-20 DIAGNOSIS — Z7901 Long term (current) use of anticoagulants: Secondary | ICD-10-CM | POA: Insufficient documentation

## 2013-09-20 DIAGNOSIS — Z7982 Long term (current) use of aspirin: Secondary | ICD-10-CM | POA: Diagnosis not present

## 2013-09-20 DIAGNOSIS — Z79899 Other long term (current) drug therapy: Secondary | ICD-10-CM | POA: Insufficient documentation

## 2013-09-20 DIAGNOSIS — N200 Calculus of kidney: Secondary | ICD-10-CM | POA: Diagnosis not present

## 2013-09-20 DIAGNOSIS — Z96649 Presence of unspecified artificial hip joint: Secondary | ICD-10-CM | POA: Insufficient documentation

## 2013-09-20 DIAGNOSIS — F17200 Nicotine dependence, unspecified, uncomplicated: Secondary | ICD-10-CM | POA: Insufficient documentation

## 2013-09-20 DIAGNOSIS — Z8639 Personal history of other endocrine, nutritional and metabolic disease: Secondary | ICD-10-CM | POA: Insufficient documentation

## 2013-09-20 DIAGNOSIS — I4891 Unspecified atrial fibrillation: Secondary | ICD-10-CM | POA: Diagnosis not present

## 2013-09-20 DIAGNOSIS — M109 Gout, unspecified: Secondary | ICD-10-CM | POA: Insufficient documentation

## 2013-09-20 DIAGNOSIS — N201 Calculus of ureter: Secondary | ICD-10-CM | POA: Diagnosis not present

## 2013-09-20 DIAGNOSIS — Z862 Personal history of diseases of the blood and blood-forming organs and certain disorders involving the immune mechanism: Secondary | ICD-10-CM | POA: Insufficient documentation

## 2013-09-20 DIAGNOSIS — E7800 Pure hypercholesterolemia, unspecified: Secondary | ICD-10-CM | POA: Insufficient documentation

## 2013-09-20 HISTORY — PX: CYSTOSCOPY W/ URETERAL STENT PLACEMENT: SHX1429

## 2013-09-20 HISTORY — PX: HOLMIUM LASER APPLICATION: SHX5852

## 2013-09-20 HISTORY — PX: CYSTOSCOPY WITH URETEROSCOPY: SHX5123

## 2013-09-20 LAB — POCT HEMOGLOBIN-HEMACUE: Hemoglobin: 12.5 g/dL — ABNORMAL LOW (ref 13.0–17.0)

## 2013-09-20 SURGERY — CYSTOSCOPY WITH URETEROSCOPY
Anesthesia: General | Site: Ureter | Laterality: Left

## 2013-09-20 MED ORDER — LIDOCAINE HCL (CARDIAC) 20 MG/ML IV SOLN
INTRAVENOUS | Status: DC | PRN
  Administered 2013-09-20: 100 mg via INTRAVENOUS

## 2013-09-20 MED ORDER — PROPOFOL 10 MG/ML IV BOLUS
INTRAVENOUS | Status: DC | PRN
  Administered 2013-09-20: 200 mg via INTRAVENOUS

## 2013-09-20 MED ORDER — ONDANSETRON HCL 4 MG/2ML IJ SOLN
INTRAMUSCULAR | Status: DC | PRN
  Administered 2013-09-20: 4 mg via INTRAVENOUS

## 2013-09-20 MED ORDER — LACTATED RINGERS IV SOLN
INTRAVENOUS | Status: DC
  Administered 2013-09-20: 08:00:00 via INTRAVENOUS
  Filled 2013-09-20: qty 1000

## 2013-09-20 MED ORDER — KETOROLAC TROMETHAMINE 30 MG/ML IJ SOLN
INTRAMUSCULAR | Status: DC | PRN
  Administered 2013-09-20: 30 mg via INTRAVENOUS

## 2013-09-20 MED ORDER — CEFAZOLIN SODIUM 1-5 GM-% IV SOLN
1.0000 g | INTRAVENOUS | Status: DC
  Filled 2013-09-20: qty 50

## 2013-09-20 MED ORDER — CEFAZOLIN SODIUM-DEXTROSE 2-3 GM-% IV SOLR
INTRAVENOUS | Status: DC | PRN
  Administered 2013-09-20: 2 g via INTRAVENOUS

## 2013-09-20 MED ORDER — CIPROFLOXACIN HCL 500 MG PO TABS: 500.0000 mg | ORAL_TABLET | Freq: Two times a day (BID) | ORAL | Status: DC

## 2013-09-20 MED ORDER — FENTANYL CITRATE 0.05 MG/ML IJ SOLN
INTRAMUSCULAR | Status: DC | PRN
  Administered 2013-09-20 (×4): 12.5 ug via INTRAVENOUS
  Administered 2013-09-20: 50 ug via INTRAVENOUS
  Administered 2013-09-20 (×4): 12.5 ug via INTRAVENOUS

## 2013-09-20 MED ORDER — FENTANYL CITRATE 0.05 MG/ML IJ SOLN
INTRAMUSCULAR | Status: AC
  Filled 2013-09-20: qty 4

## 2013-09-20 MED ORDER — MIDAZOLAM HCL 5 MG/5ML IJ SOLN
INTRAMUSCULAR | Status: DC | PRN
  Administered 2013-09-20 (×2): 1 mg via INTRAVENOUS

## 2013-09-20 MED ORDER — LACTATED RINGERS IV SOLN
INTRAVENOUS | Status: DC | PRN
  Administered 2013-09-20 (×2): via INTRAVENOUS

## 2013-09-20 MED ORDER — CEFAZOLIN SODIUM-DEXTROSE 2-3 GM-% IV SOLR
2.0000 g | INTRAVENOUS | Status: DC
  Filled 2013-09-20: qty 50

## 2013-09-20 MED ORDER — EPHEDRINE SULFATE 50 MG/ML IJ SOLN
INTRAMUSCULAR | Status: DC | PRN
  Administered 2013-09-20 (×2): 10 mg via INTRAVENOUS

## 2013-09-20 MED ORDER — DEXAMETHASONE SODIUM PHOSPHATE 4 MG/ML IJ SOLN
INTRAMUSCULAR | Status: DC | PRN
  Administered 2013-09-20: 10 mg via INTRAVENOUS

## 2013-09-20 MED ORDER — MIDAZOLAM HCL 2 MG/2ML IJ SOLN
INTRAMUSCULAR | Status: AC
  Filled 2013-09-20: qty 2

## 2013-09-20 MED ORDER — SODIUM CHLORIDE 0.9 % IR SOLN
Status: DC | PRN
  Administered 2013-09-20: 4000 mL

## 2013-09-20 MED ORDER — MEPERIDINE HCL 25 MG/ML IJ SOLN
6.2500 mg | INTRAMUSCULAR | Status: DC | PRN
  Filled 2013-09-20: qty 1

## 2013-09-20 MED ORDER — FENTANYL CITRATE 0.05 MG/ML IJ SOLN
25.0000 ug | INTRAMUSCULAR | Status: DC | PRN
  Filled 2013-09-20: qty 1

## 2013-09-20 MED ORDER — PROMETHAZINE HCL 25 MG/ML IJ SOLN
6.2500 mg | INTRAMUSCULAR | Status: DC | PRN
  Filled 2013-09-20: qty 1

## 2013-09-20 SURGICAL SUPPLY — 44 items
ADAPTER CATH URET PLST 4-6FR (CATHETERS) IMPLANT
ADPR CATH URET STRL DISP 4-6FR (CATHETERS)
BAG DRAIN URO-CYSTO SKYTR STRL (DRAIN) ×2 IMPLANT
BAG DRN UROCATH (DRAIN) ×1
BASKET LASER NITINOL 1.9FR (BASKET) IMPLANT
BASKET STNLS GEMINI 4WIRE 3FR (BASKET) IMPLANT
BASKET ZERO TIP NITINOL 2.4FR (BASKET) ×1 IMPLANT
BRUSH URET BIOPSY 3F (UROLOGICAL SUPPLIES) IMPLANT
BSKT STON RTRVL 120 1.9FR (BASKET)
BSKT STON RTRVL GEM 120X11 3FR (BASKET)
BSKT STON RTRVL ZERO TP 2.4FR (BASKET) ×1
CANISTER SUCT LVC 12 LTR MEDI- (MISCELLANEOUS) ×1 IMPLANT
CATH INTERMIT  6FR 70CM (CATHETERS) IMPLANT
CATH URET 5FR 28IN CONE TIP (BALLOONS)
CATH URET 5FR 28IN OPEN ENDED (CATHETERS) IMPLANT
CATH URET 5FR 70CM CONE TIP (BALLOONS) IMPLANT
CLOTH BEACON ORANGE TIMEOUT ST (SAFETY) ×2 IMPLANT
DRAPE CAMERA CLOSED 9X96 (DRAPES) ×1 IMPLANT
ELECT REM PT RETURN 9FT ADLT (ELECTROSURGICAL)
ELECTRODE REM PT RTRN 9FT ADLT (ELECTROSURGICAL) IMPLANT
FIBER LASER TRAC TIP (UROLOGICAL SUPPLIES) ×1 IMPLANT
GLOVE BIO SURGEON STRL SZ7.5 (GLOVE) ×2 IMPLANT
GLOVE BIOGEL M STER SZ 6 (GLOVE) ×1 IMPLANT
GLOVE BIOGEL PI IND STRL 6.5 (GLOVE) IMPLANT
GLOVE BIOGEL PI INDICATOR 6.5 (GLOVE) ×2
GOWN PREVENTION PLUS LG XLONG (DISPOSABLE) ×2 IMPLANT
GOWN STRL REIN XL XLG (GOWN DISPOSABLE) ×2 IMPLANT
GUIDEWIRE 0.038 PTFE COATED (WIRE) ×2 IMPLANT
GUIDEWIRE ANG ZIPWIRE 038X150 (WIRE) IMPLANT
GUIDEWIRE STR DUAL SENSOR (WIRE) ×3 IMPLANT
IV NS 1000ML (IV SOLUTION) ×8
IV NS 1000ML BAXH (IV SOLUTION) IMPLANT
IV NS IRRIG 3000ML ARTHROMATIC (IV SOLUTION) ×2 IMPLANT
KIT BALLIN UROMAX 15FX10 (LABEL) IMPLANT
KIT BALLN UROMAX 15FX4 (MISCELLANEOUS) IMPLANT
KIT BALLN UROMAX 26 75X4 (MISCELLANEOUS)
PACK CYSTOSCOPY (CUSTOM PROCEDURE TRAY) ×2 IMPLANT
SET HIGH PRES BAL DIL (LABEL)
SHEATH ACCESS URETERAL 38CM (SHEATH) ×1 IMPLANT
SHEATH ACCESS URETERAL 54CM (SHEATH) IMPLANT
SHEATH URET ACCESS 12FR/35CM (UROLOGICAL SUPPLIES) IMPLANT
SHEATH URET ACCESS 12FR/55CM (UROLOGICAL SUPPLIES) IMPLANT
STENT URET 6FRX26 CONTOUR (STENTS) ×1 IMPLANT
SYRINGE IRR TOOMEY STRL 70CC (SYRINGE) IMPLANT

## 2013-09-20 NOTE — Anesthesia Preprocedure Evaluation (Signed)
Anesthesia Evaluation  Patient identified by MRN, date of birth, ID band Patient awake    Reviewed: Allergy & Precautions, H&P , NPO status , Patient's Chart, lab work & pertinent test results  History of Anesthesia Complications (+) PROLONGED EMERGENCE  Airway Mallampati: II TM Distance: >3 FB Neck ROM: Full    Dental no notable dental hx.    Pulmonary neg pulmonary ROS, Current Smoker,  CXR 11-10-12 no active disease. breath sounds clear to auscultation  Pulmonary exam normal       Cardiovascular Exercise Tolerance: Good + dysrhythmias Atrial Fibrillation Rhythm:Regular Rate:Normal  ECG normal 11-10-12  ECHO 05-08-10 EF 55%, no significant valvular disease.   Neuro/Psych PSYCHIATRIC DISORDERS negative neurological ROS     GI/Hepatic negative GI ROS, Neg liver ROS,   Endo/Other  negative endocrine ROS  Renal/GU Renal disease  negative genitourinary   Musculoskeletal negative musculoskeletal ROS (+)   Abdominal   Peds negative pediatric ROS (+)  Hematology negative hematology ROS (+)   Anesthesia Other Findings   Reproductive/Obstetrics negative OB ROS                           Anesthesia Physical  Anesthesia Plan  ASA: III  Anesthesia Plan: General   Post-op Pain Management:    Induction: Intravenous  Airway Management Planned: LMA  Additional Equipment:   Intra-op Plan:   Post-operative Plan: Extubation in OR  Informed Consent: I have reviewed the patients History and Physical, chart, labs and discussed the procedure including the risks, benefits and alternatives for the proposed anesthesia with the patient or authorized representative who has indicated his/her understanding and acceptance.   Dental advisory given  Plan Discussed with: CRNA  Anesthesia Plan Comments: (Glidescope 11-15-12)        Anesthesia Quick Evaluation

## 2013-09-20 NOTE — Interval H&P Note (Signed)
History and Physical Interval Note:  09/20/2013 9:24 AM  [name] Short  has presented today for surgery, with the diagnosis of LEFT NEPHROLITHIASIS  The various methods of treatment have been discussed with the patient and family. After consideration of risks, benefits and other options for treatment, the patient has consented to  Procedure(s): CYSTOSCOPY WITH URETEROSCOPY (Left) HOLMIUM LASER APPLICATION (Left) as a surgical intervention .  The patient's history has been reviewed, patient examined, no change in status, stable for surgery.  I have reviewed the patient's chart and labs.  Questions were answered to the patient's satisfaction.  No significant dysuria. No fever.    Antony Haste

## 2013-09-20 NOTE — Op Note (Addendum)
Preoperative diagnosis: Nephrolithiasis Postoperative diagnosis: Nephrolithiasis, left ureteral stones  Procedure: Cystoscopy, left ureteroscopy, holmium laser lithotripsy, stone basket extraction, left ureteral stent placement  Surgeon: Mena Goes  Findings: Multiple stone fragments, one large fragment in the left lower pole.  Description of procedure: After consent was obtained patient brought to the operating room. After adequate anesthesia he was placed in lithotomy position and prepped and draped in the usual sterile fashion. A timeout was performed to confirm the patient and procedure. A cystoscope was passed per urethra and a sensor wire advanced adjacent to the stent and secured to the drape. Now the stent was grasped and removed through the urethral meatus. As anticipated the wire would not advance through the stent therefore the stent was removed without difficulty. The cystoscope was passed again and a second wire advanced and coiled in the collecting system. The bladder was drained and the scope removed.  Over one of the wires I attempted to pass a ureteral access sheath and it would not advance. Suspecting some fragments in the ureter I passed the rigid ureteroscope into the left ureter without difficulty and encountered 5 small stone fragments. These were removed with a 0 tip basket and dropped in the bladder. I repeated ureteroscopy all the way up into the proximal ureter and and the ureter to be normal without any other stone fragments. Therefore the rigid ureteroscope was removed.  Now over the second wire the ureteral access sheath passed without difficulty into the proximal ureter. The ureteroscope was then advanced and the calyces carefully inspected. There were no stones in the upper and middle pole. In the lower pole more anterior calyx containing multiple stone fragments and the most inferior calyx contained the large fragment seen on the KUB and multiple stone fragments. A large  fragment was ensnared with the Nitinol basket and removed into the more anterior calyx of the lower pole which was more favorable for lithotripsy less angulation. The stone was dropped here and at a setting of 0.8 and 8 the stone was hit with the laser and broke up into multiple small pieces.  Now with a 0 tip basket each calyx in the lower pole sequentially removed and a large pieces. There were multiple 1 mm fragments remaining in both calyces of the lower pole these were simply too small to even removed with the basket. The other calyces were again carefully inspected no other stones were noted. Also, no large fragments visible on flouro. The renal pelvis was inspected and noted to be free of stone. The access sheath was then backed out on the cystoscope and the ureter carefully inspected and noted to be free of injury and without stone.  Given the access sheath and sequential passage of the ureteroscope I decided to leave a a stent postoperatively. The bladder was drained and the wire was backloaded on the cystoscope and a 6 x 26 cm stent was advanced. The wire was removed and good coil seen in the collecting system and a good coil in the bladder. The bladder was drained and the scope removed. The patient was awakened and taken to the recovery room in stable condition.  Complications: None Blood loss: Minimal  Drains: 6 x 26 cm left ureteral stent   Specimens: Stone fragments   Disposition: Patient stable to PACU

## 2013-09-20 NOTE — Interval H&P Note (Signed)
History and Physical Interval Note:  09/20/2013 9:19 AM  [name] Short  has presented today for surgery, with the diagnosis of LEFT NEPHROLITHIASIS  The various methods of treatment have been discussed with the patient and family. After consideration of risks, benefits and other options for treatment, the patient has consented to  Procedure(s): CYSTOSCOPY WITH URETEROSCOPY (Left) HOLMIUM LASER APPLICATION (Left) as a surgical intervention .  The patient's history has been reviewed, patient examined, no change in status, stable for surgery.  I have reviewed the patient's chart and labs.  Questions were answered to the patient's satisfaction.  Again, he has been well with no significant dysuria. No fever.    Antony Haste

## 2013-09-20 NOTE — Anesthesia Postprocedure Evaluation (Signed)
  Anesthesia Post-op Note  Patient: [name] Short  Procedure(s) Performed: Procedure(s) (LRB): CYSTOSCOPY WITH URETEROSCOPY (Left) HOLMIUM LASER APPLICATION (Left) CYSTOSCOPY WITH STENT REPLACEMENT (Left)  Patient Location: PACU  Anesthesia Type: General  Level of Consciousness: awake and alert   Airway and Oxygen Therapy: Patient Spontanous Breathing  Post-op Pain: mild  Post-op Assessment: Post-op Vital signs reviewed, Patient's Cardiovascular Status Stable, Respiratory Function Stable, Patent Airway and No signs of Nausea or vomiting  Last Vitals:  Filed Vitals:   09/20/13 1100  BP: 124/74  Pulse: 75  Temp:   Resp: 12    Post-op Vital Signs: stable   Complications: No apparent anesthesia complications

## 2013-09-20 NOTE — Transfer of Care (Signed)
Immediate Anesthesia Transfer of Care Note  Patient: [name] Short  Procedure(s) Performed: Procedure(s) (LRB): CYSTOSCOPY WITH URETEROSCOPY (Left) HOLMIUM LASER APPLICATION (Left) CYSTOSCOPY WITH STENT REPLACEMENT (Left)  Patient Location: PACU  Anesthesia Type: General  Level of Consciousness: awake, sedated, patient cooperative and responds to stimulation  Airway & Oxygen Therapy: Patient Spontanous Breathing and Patient connected to face mask oxygen  Post-op Assessment: Report given to PACU RN, Post -op Vital signs reviewed and stable and Patient moving all extremities  Post vital signs: Reviewed and stable  Complications: No apparent anesthesia complications

## 2013-09-20 NOTE — Anesthesia Procedure Notes (Signed)
Procedure Name: LMA Insertion Date/Time: 09/20/2013 9:12 AM Performed by: Jessica Priest Pre-anesthesia Checklist: Patient identified, Emergency Drugs available, Suction available and Patient being monitored Patient Re-evaluated:Patient Re-evaluated prior to inductionOxygen Delivery Method: Circle System Utilized Preoxygenation: Pre-oxygenation with 100% oxygen Intubation Type: IV induction Ventilation: Mask ventilation without difficulty LMA: LMA inserted LMA Size: 4.0 Number of attempts: 1 Airway Equipment and Method: bite block Placement Confirmation: positive ETCO2 Tube secured with: Tape Dental Injury: Teeth and Oropharynx as per pre-operative assessment  Comments: Oral bite guard inserted without difficulty on right side mouth- Teeth intact as preop- LB

## 2013-09-21 DIAGNOSIS — N200 Calculus of kidney: Secondary | ICD-10-CM | POA: Diagnosis not present

## 2013-09-26 ENCOUNTER — Encounter (HOSPITAL_BASED_OUTPATIENT_CLINIC_OR_DEPARTMENT_OTHER): Payer: Self-pay | Admitting: Urology

## 2013-09-27 ENCOUNTER — Ambulatory Visit: Admit: 2013-09-27 | Payer: Self-pay | Admitting: Urology

## 2013-09-27 SURGERY — CYSTOSCOPY WITH URETEROSCOPY
Anesthesia: General | Laterality: Left

## 2013-10-10 ENCOUNTER — Other Ambulatory Visit: Payer: Self-pay | Admitting: Family Medicine

## 2013-10-10 NOTE — Telephone Encounter (Signed)
Med filled.  

## 2013-10-31 DIAGNOSIS — N200 Calculus of kidney: Secondary | ICD-10-CM | POA: Diagnosis not present

## 2013-12-12 DIAGNOSIS — N200 Calculus of kidney: Secondary | ICD-10-CM | POA: Diagnosis not present

## 2013-12-12 DIAGNOSIS — R3129 Other microscopic hematuria: Secondary | ICD-10-CM | POA: Diagnosis not present

## 2013-12-17 ENCOUNTER — Ambulatory Visit (INDEPENDENT_AMBULATORY_CARE_PROVIDER_SITE_OTHER): Payer: Medicare Other | Admitting: Family Medicine

## 2013-12-17 ENCOUNTER — Encounter: Payer: Self-pay | Admitting: Family Medicine

## 2013-12-17 VITALS — BP 138/82 | HR 67 | Temp 98.8°F | Wt 185.0 lb

## 2013-12-17 DIAGNOSIS — J811 Chronic pulmonary edema: Secondary | ICD-10-CM

## 2013-12-17 DIAGNOSIS — J209 Acute bronchitis, unspecified: Secondary | ICD-10-CM | POA: Diagnosis not present

## 2013-12-17 MED ORDER — CEFTRIAXONE SODIUM 1 G IJ SOLR: 1.0000 g | Freq: Once | INTRAMUSCULAR | Status: DC

## 2013-12-17 MED ORDER — AZITHROMYCIN 500 MG PO TABS: 500.0000 mg | ORAL_TABLET | Freq: Every day | ORAL | Status: DC

## 2013-12-17 MED ORDER — PREDNISONE 50 MG PO TABS: 50.0000 mg | ORAL_TABLET | Freq: Every day | ORAL | Status: DC

## 2013-12-17 NOTE — Progress Notes (Signed)
  Tawana ScaleZach Smith D.O. [facility] [address] [place], [place] [address] Phone: [phone] Subjective:     CC: Shortness of breath and cough BJY:[id number]:Subjective [name] Short is a 68 y.o. male coming in with complaint of shortness of breath and cough. Patient states for the last week he has had a cough especially at nights that seem to be getting worse. Patient's now had a cough that is going throughout the day and does feel somewhat short of breath. Patient has not had any fevers or chills and states that the cough nonproductive. Patient though has had positive sick contacts with his grandchildren recently. Patient did have a flu shot this year. Patient is accompanied with wife who states that she feels he is even sicker than he looks she states.    Past medical history, social, surgical and family history all reviewed in electronic medical record.   Review of Systems: No headache, visual changes, nausea, vomiting, diarrhea, constipation, dizziness, abdominal pain, skin rash, fevers, chills, night sweats, weight loss, swollen lymph nodes, body aches, joint swelling, muscle aches, chest pain, shortness of breath, mood changes.   Objective Blood pressure 138/82, pulse 67, temperature 98.8 F (37.1 C), temperature source Oral, weight 185 lb (83.915 kg), SpO2 98.00%.  General: No apparent distress alert and oriented x3 mood and affect normal, dressed appropriately. Mildly ill HEENT: Pupils equal, extraocular movements intact  Respiratory: Patient does have wheezing and rhonchi generalize but no focal findings. Cardiovascular: No lower extremity edema, non tender, no erythema  Skin: Warm dry intact with no signs of infection or rash on extremities or on axial skeleton.  Abdomen: Soft nontender  Neuro: Cranial nerves II through XII are intact, neurovascularly intact in all extremities with 2+ DTRs and 2+ pulses.  Lymph: No lymphadenopathy of posterior or anterior cervical chain or  axillae bilaterally.  Gait normal with good balance and coordination.  MSK:  Non tender with full range of motion and good stability and symmetric strength and tone of shoulders, elbows, wrist, hip, knee and ankles bilaterally.     Impression and Recommendations:     This case required medical decision making of moderate complexity.

## 2013-12-17 NOTE — Assessment & Plan Note (Signed)
Complete patient does have some bronchitis. There is a possibility the patient has community-acquired pneumonia but we'll hold on x-rays because it would not change the management. Patient was given an injection of ceftriaxone as well as given azithromycin for the next 3 days. Patient was given prednisone kids he continues to have shortness of breath. We discussed potential inhaler the patient has never tried on before and he declined when now. Patient as well as wife knows which signs and symptoms which caused him to seek medical attention. Patient will follow up with primary care provider 1-2 weeks to make sure that he has complete resolution of symptoms.

## 2013-12-17 NOTE — Patient Instructions (Signed)
Nice to meet you One shot today to help with bronchitis Azithromycin daily for 3 days If not bette rin 48 hours start the prednisone.  If worse or not perfect by Thursday/Friday come back .  Bronchitis Bronchitis is inflammation of the airways that extend from the windpipe into the lungs (bronchi). The inflammation often causes mucus to develop, which leads to a cough. If the inflammation becomes severe, it may cause shortness of breath. CAUSES  Bronchitis may be caused by:   Viral infections.   Bacteria.   Cigarette smoke.   Allergens, pollutants, and other irritants.  SIGNS AND SYMPTOMS  The most common symptom of bronchitis is a frequent cough that produces mucus. Other symptoms include:  Fever.   Body aches.   Chest congestion.   Chills.   Shortness of breath.   Sore throat.  DIAGNOSIS  Bronchitis is usually diagnosed through a medical history and physical exam. Tests, such as chest X-rays, are sometimes done to rule out other conditions.  TREATMENT  You may need to avoid contact with whatever caused the problem (smoking, for example). Medicines are sometimes needed. These may include:  Antibiotics. These may be prescribed if the condition is caused by bacteria.  Cough suppressants. These may be prescribed for relief of cough symptoms.   Inhaled medicines. These may be prescribed to help open your airways and make it easier for you to breathe.   Steroid medicines. These may be prescribed for those with recurrent (chronic) bronchitis. HOME CARE INSTRUCTIONS  Get plenty of rest.   Drink enough fluids to keep your urine clear or pale yellow (unless you have a medical condition that requires fluid restriction). Increasing fluids may help thin your secretions and will prevent dehydration.   Only take over-the-counter or prescription medicines as directed by your health care provider.  Only take antibiotics as directed. Make sure you finish them even if  you start to feel better.  Avoid secondhand smoke, irritating chemicals, and strong fumes. These will make bronchitis worse. If you are a smoker, quit smoking. Consider using nicotine gum or skin patches to help control withdrawal symptoms. Quitting smoking will help your lungs heal faster.   Put a cool-mist humidifier in your bedroom at night to moisten the air. This may help loosen mucus. Change the water in the humidifier daily. You can also run the hot water in your shower and sit in the bathroom with the door closed for 5 10 minutes.   Follow up with your health care provider as directed.   Wash your hands frequently to avoid catching bronchitis again or spreading an infection to others.  SEEK MEDICAL CARE IF: Your symptoms do not improve after 1 week of treatment.  SEEK IMMEDIATE MEDICAL CARE IF:  Your fever increases.  You have chills.   You have chest pain.   You have worsening shortness of breath.   You have bloody sputum.  You faint.  You have lightheadedness.  You have a severe headache.   You vomit repeatedly. MAKE SURE YOU:   Understand these instructions.  Will watch your condition.  Will get help right away if you are not doing well or get worse. Document Released: 09/15/2005 Document Revised: 07/06/2013 Document Reviewed: 05/10/2013 [facility] Patient Information 2014 [place]Care, [place].

## 2014-04-05 DIAGNOSIS — N200 Calculus of kidney: Secondary | ICD-10-CM | POA: Diagnosis not present

## 2014-04-05 DIAGNOSIS — R3129 Other microscopic hematuria: Secondary | ICD-10-CM | POA: Diagnosis not present

## 2014-04-05 DIAGNOSIS — R39198 Other difficulties with micturition: Secondary | ICD-10-CM | POA: Diagnosis not present

## 2014-05-12 DIAGNOSIS — N200 Calculus of kidney: Secondary | ICD-10-CM | POA: Diagnosis not present

## 2014-05-12 DIAGNOSIS — N201 Calculus of ureter: Secondary | ICD-10-CM | POA: Diagnosis not present

## 2014-05-12 DIAGNOSIS — R3129 Other microscopic hematuria: Secondary | ICD-10-CM | POA: Diagnosis not present

## 2014-05-17 ENCOUNTER — Other Ambulatory Visit: Payer: Self-pay | Admitting: Urology

## 2014-05-17 DIAGNOSIS — N200 Calculus of kidney: Secondary | ICD-10-CM | POA: Diagnosis not present

## 2014-05-17 DIAGNOSIS — R3129 Other microscopic hematuria: Secondary | ICD-10-CM | POA: Diagnosis not present

## 2014-05-29 ENCOUNTER — Other Ambulatory Visit: Payer: Self-pay | Admitting: Family Medicine

## 2014-05-29 NOTE — Telephone Encounter (Signed)
Med filled.  

## 2014-06-15 ENCOUNTER — Encounter (HOSPITAL_BASED_OUTPATIENT_CLINIC_OR_DEPARTMENT_OTHER): Payer: Self-pay | Admitting: *Deleted

## 2014-06-16 ENCOUNTER — Encounter (HOSPITAL_BASED_OUTPATIENT_CLINIC_OR_DEPARTMENT_OTHER): Payer: Self-pay | Admitting: *Deleted

## 2014-06-16 NOTE — Progress Notes (Signed)
NPO AFTER MN. ARRIVE AT 0600. NEEDS HG. MAY PAIN RX IF NEEDED W/ SIPS OF WATER AM DOS.

## 2014-06-20 NOTE — Anesthesia Preprocedure Evaluation (Addendum)
Anesthesia Evaluation  Patient identified by MRN, date of birth, ID band Patient awake    Reviewed: Allergy & Precautions, H&P , NPO status , Patient's Chart, lab work & pertinent test results  History of Anesthesia Complications (+) PROLONGED EMERGENCE and history of anesthetic complications (Hx of prolonged emergence, will avoid benzodiazepine )  Airway Mallampati: II TM Distance: >3 FB Neck ROM: Full    Dental no notable dental hx.    Pulmonary Current Smoker,  breath sounds clear to auscultation  Pulmonary exam normal       Cardiovascular Exercise Tolerance: Good + dysrhythmias Atrial Fibrillation Rhythm:Regular Rate:Normal     Neuro/Psych PSYCHIATRIC DISORDERS Anxiety negative neurological ROS     GI/Hepatic negative GI ROS, Neg liver ROS,   Endo/Other  negative endocrine ROS  Renal/GU Renal disease  negative genitourinary   Musculoskeletal  (+) Arthritis -,   Abdominal   Peds negative pediatric ROS (+)  Hematology negative hematology ROS (+)   Anesthesia Other Findings   Reproductive/Obstetrics negative OB ROS                         Anesthesia Physical Anesthesia Plan  ASA: II  Anesthesia Plan: General   Post-op Pain Management:    Induction: Intravenous  Airway Management Planned: LMA  Additional Equipment:   Intra-op Plan:   Post-operative Plan: Extubation in OR  Informed Consent: I have reviewed the patients History and Physical, chart, labs and discussed the procedure including the risks, benefits and alternatives for the proposed anesthesia with the patient or authorized representative who has indicated his/her understanding and acceptance.   Dental advisory given  Plan Discussed with: CRNA  Anesthesia Plan Comments:         Anesthesia Quick Evaluation

## 2014-06-23 ENCOUNTER — Encounter (HOSPITAL_BASED_OUTPATIENT_CLINIC_OR_DEPARTMENT_OTHER): Payer: Medicare Other | Admitting: Anesthesiology

## 2014-06-23 ENCOUNTER — Encounter (HOSPITAL_BASED_OUTPATIENT_CLINIC_OR_DEPARTMENT_OTHER)
Admission: RE | Disposition: A | Discharge status: Home / Self Care | Payer: Self-pay | Source: Ambulatory Visit | Attending: Urology

## 2014-06-23 ENCOUNTER — Ambulatory Visit (HOSPITAL_BASED_OUTPATIENT_CLINIC_OR_DEPARTMENT_OTHER): Payer: Medicare Other | Admitting: Anesthesiology

## 2014-06-23 ENCOUNTER — Ambulatory Visit (HOSPITAL_BASED_OUTPATIENT_CLINIC_OR_DEPARTMENT_OTHER)
Admission: RE | Admit: 2014-06-23 | Discharge: 2014-06-23 | Disposition: A | Discharge status: Home / Self Care | Payer: Medicare Other | Source: Ambulatory Visit | Attending: Urology | Admitting: Urology

## 2014-06-23 ENCOUNTER — Encounter (HOSPITAL_BASED_OUTPATIENT_CLINIC_OR_DEPARTMENT_OTHER): Payer: Self-pay | Admitting: *Deleted

## 2014-06-23 DIAGNOSIS — Z79899 Other long term (current) drug therapy: Secondary | ICD-10-CM | POA: Diagnosis not present

## 2014-06-23 DIAGNOSIS — M109 Gout, unspecified: Secondary | ICD-10-CM | POA: Insufficient documentation

## 2014-06-23 DIAGNOSIS — E785 Hyperlipidemia, unspecified: Secondary | ICD-10-CM | POA: Diagnosis not present

## 2014-06-23 DIAGNOSIS — Z888 Allergy status to other drugs, medicaments and biological substances status: Secondary | ICD-10-CM | POA: Insufficient documentation

## 2014-06-23 DIAGNOSIS — I4891 Unspecified atrial fibrillation: Secondary | ICD-10-CM | POA: Insufficient documentation

## 2014-06-23 DIAGNOSIS — N200 Calculus of kidney: Secondary | ICD-10-CM | POA: Diagnosis not present

## 2014-06-23 DIAGNOSIS — Z7982 Long term (current) use of aspirin: Secondary | ICD-10-CM | POA: Insufficient documentation

## 2014-06-23 DIAGNOSIS — M19039 Primary osteoarthritis, unspecified wrist: Secondary | ICD-10-CM | POA: Diagnosis not present

## 2014-06-23 DIAGNOSIS — F411 Generalized anxiety disorder: Secondary | ICD-10-CM | POA: Diagnosis not present

## 2014-06-23 DIAGNOSIS — F17200 Nicotine dependence, unspecified, uncomplicated: Secondary | ICD-10-CM | POA: Diagnosis not present

## 2014-06-23 HISTORY — PX: CYSTOSCOPY WITH URETEROSCOPY AND STENT PLACEMENT: SHX6377

## 2014-06-23 HISTORY — DX: Other specified personal risk factors, not elsewhere classified: Z91.89

## 2014-06-23 LAB — POCT I-STAT, CHEM 8
BUN: 27 mg/dL — ABNORMAL HIGH (ref 6–23)
Calcium, Ion: 1.26 mmol/L (ref 1.13–1.30)
Chloride: 105 mEq/L (ref 96–112)
Creatinine, Ser: 1.6 mg/dL — ABNORMAL HIGH (ref 0.50–1.35)
Glucose, Bld: 107 mg/dL — ABNORMAL HIGH (ref 70–99)
HCT: 63 % — ABNORMAL HIGH (ref 39.0–52.0)
Hemoglobin: 21.4 g/dL (ref 13.0–17.0)
Potassium: 3.9 mEq/L (ref 3.7–5.3)
Sodium: 144 mEq/L (ref 137–147)
TCO2: 26 mmol/L (ref 0–100)

## 2014-06-23 SURGERY — CYSTOURETEROSCOPY, WITH STENT INSERTION
Anesthesia: General | Site: Ureter | Laterality: Bilateral

## 2014-06-23 MED ORDER — CEPHALEXIN 500 MG PO CAPS: 500.0000 mg | ORAL_CAPSULE | Freq: Two times a day (BID) | ORAL | Status: DC

## 2014-06-23 MED ORDER — CEFAZOLIN SODIUM 1-5 GM-% IV SOLN
1.0000 g | INTRAVENOUS | Status: DC
Start: 2014-06-23 — End: 2014-06-23
  Filled 2014-06-23: qty 50

## 2014-06-23 MED ORDER — ONDANSETRON HCL 4 MG/2ML IJ SOLN
INTRAMUSCULAR | Status: DC | PRN
  Administered 2014-06-23: 4 mg via INTRAVENOUS

## 2014-06-23 MED ORDER — LIDOCAINE HCL 2 % EX GEL
CUTANEOUS | Status: DC | PRN
  Administered 2014-06-23: 1 via URETHRAL

## 2014-06-23 MED ORDER — DEXAMETHASONE SODIUM PHOSPHATE 4 MG/ML IJ SOLN
INTRAMUSCULAR | Status: DC | PRN
  Administered 2014-06-23: 8 mg via INTRAVENOUS

## 2014-06-23 MED ORDER — FENTANYL CITRATE 0.05 MG/ML IJ SOLN
INTRAMUSCULAR | Status: DC | PRN
  Administered 2014-06-23 (×2): 25 ug via INTRAVENOUS
  Administered 2014-06-23: 50 ug via INTRAVENOUS

## 2014-06-23 MED ORDER — FENTANYL CITRATE 0.05 MG/ML IJ SOLN
25.0000 ug | INTRAMUSCULAR | Status: DC | PRN
  Filled 2014-06-23: qty 1

## 2014-06-23 MED ORDER — MIDAZOLAM HCL 2 MG/2ML IJ SOLN
INTRAMUSCULAR | Status: AC
  Filled 2014-06-23: qty 2

## 2014-06-23 MED ORDER — PROPOFOL 10 MG/ML IV BOLUS
INTRAVENOUS | Status: DC | PRN
  Administered 2014-06-23: 150 mg via INTRAVENOUS

## 2014-06-23 MED ORDER — TAMSULOSIN HCL 0.4 MG PO CAPS: 0.4000 mg | ORAL_CAPSULE | Freq: Every day | ORAL | Status: DC

## 2014-06-23 MED ORDER — CEFAZOLIN SODIUM-DEXTROSE 2-3 GM-% IV SOLR
2.0000 g | INTRAVENOUS | Status: AC
Start: 2014-06-23 — End: 2014-06-23
  Administered 2014-06-23: 2 g via INTRAVENOUS
  Filled 2014-06-23: qty 50

## 2014-06-23 MED ORDER — OXYCODONE-ACETAMINOPHEN 5-325 MG PO TABS
1.0000 | ORAL_TABLET | Freq: Once | ORAL | Status: AC
  Administered 2014-06-23: 1 via ORAL
  Filled 2014-06-23: qty 1

## 2014-06-23 MED ORDER — OXYCODONE-ACETAMINOPHEN 5-325 MG PO TABS
ORAL_TABLET | ORAL | Status: AC
  Filled 2014-06-23: qty 1

## 2014-06-23 MED ORDER — LACTATED RINGERS IV SOLN
INTRAVENOUS | Status: DC
  Administered 2014-06-23 (×2): via INTRAVENOUS
  Filled 2014-06-23: qty 1000

## 2014-06-23 MED ORDER — OXYCODONE-ACETAMINOPHEN 5-325 MG PO TABS: 1.0000 | ORAL_TABLET | Freq: Four times a day (QID) | ORAL | Status: DC | PRN

## 2014-06-23 MED ORDER — SODIUM CHLORIDE 0.9 % IR SOLN
Status: DC | PRN
  Administered 2014-06-23: 8000 mL

## 2014-06-23 MED ORDER — LIDOCAINE HCL (CARDIAC) 20 MG/ML IV SOLN
INTRAVENOUS | Status: DC | PRN
  Administered 2014-06-23: 60 mg via INTRAVENOUS

## 2014-06-23 MED ORDER — FENTANYL CITRATE 0.05 MG/ML IJ SOLN
INTRAMUSCULAR | Status: AC
  Filled 2014-06-23: qty 6

## 2014-06-23 MED ORDER — IOHEXOL 350 MG/ML SOLN
INTRAVENOUS | Status: DC | PRN
  Administered 2014-06-23: 40 mL

## 2014-06-23 MED ORDER — PROMETHAZINE HCL 25 MG/ML IJ SOLN
6.2500 mg | INTRAMUSCULAR | Status: DC | PRN
  Filled 2014-06-23: qty 1

## 2014-06-23 SURGICAL SUPPLY — 46 items
ADAPTER CATH URET PLST 4-6FR (CATHETERS) IMPLANT
ADPR CATH URET STRL DISP 4-6FR (CATHETERS)
BAG DRAIN URO-CYSTO SKYTR STRL (DRAIN) ×2 IMPLANT
BAG DRN UROCATH (DRAIN) ×1
BASKET LASER NITINOL 1.9FR (BASKET) IMPLANT
BASKET STNLS GEMINI 4WIRE 3FR (BASKET) IMPLANT
BASKET ZERO TIP NITINOL 2.4FR (BASKET) ×1 IMPLANT
BSKT STON RTRVL 120 1.9FR (BASKET)
BSKT STON RTRVL GEM 120X11 3FR (BASKET)
BSKT STON RTRVL ZERO TP 2.4FR (BASKET) ×1
CANISTER SUCT LVC 12 LTR MEDI- (MISCELLANEOUS) ×1 IMPLANT
CATH INTERMIT  6FR 70CM (CATHETERS) ×1 IMPLANT
CATH URET 5FR 28IN CONE TIP (BALLOONS)
CATH URET 5FR 28IN OPEN ENDED (CATHETERS) IMPLANT
CATH URET 5FR 70CM CONE TIP (BALLOONS) IMPLANT
CATH URET DUAL LUMEN 6-10FR 50 (CATHETERS) ×1 IMPLANT
CLOTH BEACON ORANGE TIMEOUT ST (SAFETY) ×2 IMPLANT
DRAPE CAMERA CLOSED 9X96 (DRAPES) ×1 IMPLANT
ELECT REM PT RETURN 9FT ADLT (ELECTROSURGICAL)
ELECTRODE REM PT RTRN 9FT ADLT (ELECTROSURGICAL) IMPLANT
FIBER LASER TRAC TIP (UROLOGICAL SUPPLIES) ×1 IMPLANT
GLOVE BIO SURGEON STRL SZ 6.5 (GLOVE) ×1 IMPLANT
GLOVE BIO SURGEON STRL SZ7.5 (GLOVE) ×2 IMPLANT
GLOVE INDICATOR 6.5 STRL GRN (GLOVE) ×1 IMPLANT
GOWN PREVENTION PLUS LG XLONG (DISPOSABLE) ×1 IMPLANT
GOWN STRL REIN XL XLG (GOWN DISPOSABLE) ×1 IMPLANT
GOWN STRL REUS W/ TWL LRG LVL3 (GOWN DISPOSABLE) IMPLANT
GOWN STRL REUS W/ TWL XL LVL3 (GOWN DISPOSABLE) IMPLANT
GOWN STRL REUS W/TWL LRG LVL3 (GOWN DISPOSABLE) ×2
GOWN STRL REUS W/TWL XL LVL3 (GOWN DISPOSABLE) ×2
GUIDEWIRE 0.038 PTFE COATED (WIRE) IMPLANT
GUIDEWIRE ANG ZIPWIRE 038X150 (WIRE) ×1 IMPLANT
GUIDEWIRE STR DUAL SENSOR (WIRE) ×2 IMPLANT
IV NS 1000ML (IV SOLUTION) ×4
IV NS 1000ML BAXH (IV SOLUTION) IMPLANT
IV NS IRRIG 3000ML ARTHROMATIC (IV SOLUTION) ×4 IMPLANT
KIT BALLIN UROMAX 15FX10 (LABEL) IMPLANT
KIT BALLN UROMAX 15FX4 (MISCELLANEOUS) IMPLANT
KIT BALLN UROMAX 26 75X4 (MISCELLANEOUS)
LEGGING LITHOTOMY PAIR STRL (DRAPES) ×1 IMPLANT
PACK CYSTO (CUSTOM PROCEDURE TRAY) ×2 IMPLANT
SET HIGH PRES BAL DIL (LABEL)
SHEATH ACCESS URETERAL 38CM (SHEATH) ×1 IMPLANT
SHEATH ACCESS URETERAL 54CM (SHEATH) IMPLANT
STENT URET 6FRX26 CONTOUR (STENTS) ×1 IMPLANT
SYRINGE IRR TOOMEY STRL 70CC (SYRINGE) IMPLANT

## 2014-06-23 NOTE — Op Note (Signed)
Preoperative diagnosis: Right nephrolithiasis, left nephrolithiasis Postoperative diagnosis: Right nephrolithiasis, right ureterolithiasis, left nephrolithiasis  Procedure: Exam under anesthesia Cystoscopy Bilateral retrograde pyelogram Right ureteroscopy with holmium laser lithotripsy, stone basket extraction Right nephroscopy with holmium laser lithotripsy stone basket extraction Right ureteral stent placement  -22 modifier for significant stone burden in the right ureter with 3 large stones, significant stone burden in the kidney with 2 large stones. The procedure took much longer than standard ureteroscopy, several retrograde pyelograms were needed to be obtained. A total of 4 large stones requiring lithotripsy and basket removal and another stone stone removed in its entirety with the basket.  Surgeon: Mena Goes  Anesthesia: Judd  Type of anesthesia: Gen.  Indication for procedure: Patient underwent CT scanning recently which showed some obstructing proximal left ureteral fragments and some left renal stones. Also significant burden of stone in the right renal pelvis. He was brought for bilateral evaluation with retrograde, possible ureteroscopy laser lithotripsy and stenting.  Findings: On exam under anesthesia the penis was without mass or lesion. Testicles were descended and palpably normal. On digital rectal exam the prostate was mildly enlarged but smooth without hard area or nodule. All landmarks were preserved.  Cystoscopy ureteroscopy was unremarkable.  Left retrograde pyelogram-this outlined a single ureter single collecting system unit. There was no filling defect, stricture or dilation. The left side emptied briskly with excellent efflux.  Right retrograde pyelogram-this outlined a large filling defect in the distal ureter near the UVJ consistent with a large stone. Proximal to this minimal contrast progressed up into the mid ureter. Repeat retrograde on the right after  obtaining proximal access outlined a normal proximal ureter, slight tortuous curve at the UPJ and filling of a mildly dilated right collecting system that contained a large round filling defect consistent with one remaining stone in the kidney.  Right ureteroscopy revealed 3 large stones in the distal ureter. A 4-5 mm stone in the midpole calyx and the remaining large lower pole stone and progressed up into the renal pelvis. It was placed in the upper pole and fragmented. At the end there were no significant fragments remaining. The access sheath was backed out on the ureteroscope and the ureter was inspected in its entirety on the way out and noted to be normal without stone fragment or injury.  Description of procedure: After consent was obtained patient brought to the operating room. After adequate anesthesia he was placed in lithotomy position and prepped and draped in the usual sterile fashion. A timeout was performed to confirm the patient and procedure. An exam under anesthesia was performed. A cystoscope was passed per urethra and a 6 [place] open-ended catheter used to cannulate the left ureteral orifice. Left retrograde injection of contrast was performed. This was normal and focused was turned to the right side.  The 6 [place] open-ended catheter was used to cannulate the right ureteral orifice. Retrograde injection of contrast was performed. I tried to pass a sensor wire and an angled Glidewire and both wire simply went passed the large stone and coiled. No proximal access could be obtained.  Therefore ureteroscope was advanced through the ureteral orifice where a large stone was visualized. It was dusted at a setting of 0.4 and 20. Proximal to this a larger stone was located. It then required laser lithotripsy. It was fragmented and no pieces removed sequentially and dropped in the bladder with a 0 tip basket. I went back in and was now able to guide a wire up into the  collecting system.  To  confirm wire placement in the ureteroscope was removed. A 6 French open-ended catheter was advanced up into the proximal area of the ureter. The wire was removed and retrograde injection of contrast was performed confirming placement of the wire in the proximal ureter and collecting system outlined as above. At glide wire was then advanced and coiled into the upper pole collecting system.  The ureteroscope was passed back in the distal right ureter and again to an area of significant edema and inflammation. Careful probing in this area with the laser revealed another large stone which I was suspicious of but this would not pass the 6 [place] for the second retrograde I could feel the 6 French catheter gliding beside a stone. This third stone was then fully visualized in the lumen fragmented and removed sequentially with the basket. Now I was able to pass the ureteroscope without difficulty up in the proximal ureter and found no other stones. Ureteroscope was backed out.  A dual-lumen exchange catheter was passed into the right proximal ureter. Another retrograde confirmed again and normal proximally up the ureter and collecting system. A sensor wire was advanced and coiled in the collecting system to provide a second wire. The dual-lumen was removed and an access sheath place. The access sheath advanced without any resistance or difficulty.  The digital ureteroscope was then advanced in the collecting system carefully inspected. The right UPJ and the had a curve 14 to this was a consistent either with some prior hydronephrosis and secondary UPJ or possibly some external compression but this was not significant. The collecting system was carefully inspected one of the large lower pole stones had passed up into the renal pelvis as seen on the CT. The other ones had obviously passed down into the distal ureter. There is also about a 5 or 6 mm stone in a midpole calyx this was grasped with the Nitinol basket and  arranged unable to be removed without any difficulty. The large stone was then grasped and the Nitinol basket and put into the upper pole collecting system for ease of lasering. He was then fragmented at a setting of 0.4 and 20 into small pieces. All his significant pieces were removed sequentially with the basket. One of the remaining pieces was pulled out and was about 1 mm. There were no other significant fragments. The collecting system was carefully irrigated and inspected. And again no other fragments or significant stone fragments were noted. There was no injury. The access sheath was backed out on the cystoscope and the UPJ and ureter carefully inspected again and noted to be without injury or stone fragment.  At each stage of the procedure I passed the cystoscope and drain the bladder. The cystoscope was passed again and the bladder drained. The Glidewire was then backloaded on the cystoscope and a 626 cm stent was advanced. The wire was removed a good coil seen in the collecting system and a good coil in the bladder. The bladder was drained and the scope removed. Lidocaine jelly was instilled per urethra and a penile clamp placed. The patient was awakened and taken to recovery room in stable condition.  Complications: None Blood loss: Minimal  Specimens: Stone fragments to office lab. This was a small percent of the stone burden. Most of the stones were dusted.  Drains: 6 x 26 cm right ureteral stent

## 2014-06-23 NOTE — Discharge Instructions (Signed)
Ureteral Stent Implantation, Care After °Refer to this sheet in the next few weeks. These instructions provide you with information on caring for yourself after your procedure. Your health care provider may also give you more specific instructions. Your treatment has been planned according to current medical practices, but problems sometimes occur. Call your health care provider if you have any problems or questions after your procedure. °WHAT TO EXPECT AFTER THE PROCEDURE °You should be back to normal activity within 48 hours after the procedure. Nausea and vomiting may occur and are commonly the result of anesthesia. °It is common to experience sharp pain in the back or lower abdomen and penis with voiding. This is caused by movement of the ends of the stent with the act of urinating. It usually goes away within minutes after you have stopped urinating. °HOME CARE INSTRUCTIONS °Make sure to drink plenty of fluids. You may have small amounts of bleeding, causing your urine to be red. This is normal. Certain movements may trigger pain or a feeling that you need to urinate. You may be given medicines to prevent infection or bladder spasms. Be sure to take all medicines as directed. Only take over-the-counter or prescription medicines for pain, discomfort, or fever as directed by your health care provider. Do not take aspirin, as this can make bleeding worse. °Your stent will be left in until the blockage is resolved. This may take 2 weeks or longer, depending on the reason for stent implantation. You may have an X-ray exam to make sure your ureter is open and that the stent has not moved out of position (migrated). The stent can be removed by your health care provider in the office. Medicines may be given for comfort while the stent is being removed. Be sure to keep all follow-up appointments so your health care provider can check that you are healing properly. °SEEK MEDICAL CARE IF: °· You experience increasing  pain. °· Your pain medicine is not working. °SEEK IMMEDIATE MEDICAL CARE IF: °· Your urine is dark red or has blood clots. °· You are leaking urine (incontinent). °· You have a fever, chills, feeling sick to your stomach (nausea), or vomiting. °· Your pain is not relieved by pain medicine. °· The end of the stent comes out of the urethra. °· You are unable to urinate. °Document Released: 05/18/2013 Document Revised: 09/20/2013 Document Reviewed: 05/18/2013 °Exit[employer]® Patient Information ©2015 [employer]. This information is not intended to replace advice given to you by your health care provider. Make sure you discuss any questions you have with your health care provider. ° ° ° °Post Anesthesia Home Care Instructions ° °Activity: °Get plenty of rest for the remainder of the day. A responsible adult should stay with you for 24 hours following the procedure.  °For the next 24 hours, DO NOT: °-Drive a car °-Operate machinery °-Drink alcoholic beverages °-Take any medication unless instructed by your physician °-Make any legal decisions or sign important papers. ° °Meals: °Start with liquid foods such as gelatin or soup. Progress to regular foods as tolerated. Avoid greasy, spicy, heavy foods. If nausea and/or vomiting occur, drink only clear liquids until the nausea and/or vomiting subsides. Call your physician if vomiting continues. ° °Special Instructions/Symptoms: °Your throat may feel dry or sore from the anesthesia or the breathing tube placed in your throat during surgery. If this causes discomfort, gargle with warm salt water. The discomfort should disappear within 24 hours. ° °

## 2014-06-23 NOTE — Transfer of Care (Signed)
Immediate Anesthesia Transfer of Care Note  Patient: [name] Short  Procedure(s) Performed: Procedure(s) (LRB): BILATERAL URETEROSCOPY, HOLMIUM LASER LITHOTRIPSY AND STENT PLACEMENT (Bilateral)  Patient Location: PACU  Anesthesia Type: General  Level of Consciousness: awake, oriented, sedated and patient cooperative  Airway & Oxygen Therapy: Patient Spontanous Breathing and Patient connected to face mask oxygen  Post-op Assessment: Report given to PACU RN and Post -op Vital signs reviewed and stable  Post vital signs: Reviewed and stable  Complications: No apparent anesthesia complications

## 2014-06-23 NOTE — Anesthesia Procedure Notes (Signed)
Procedure Name: LMA Insertion Date/Time: 06/23/2014 7:39 AM Performed by: Renella Cunas D Pre-anesthesia Checklist: Patient identified, Emergency Drugs available, Suction available and Patient being monitored Patient Re-evaluated:Patient Re-evaluated prior to inductionOxygen Delivery Method: Circle System Utilized Preoxygenation: Pre-oxygenation with 100% oxygen Intubation Type: IV induction Ventilation: Mask ventilation without difficulty LMA: LMA inserted LMA Size: 4.0 Number of attempts: 2 Airway Equipment and Method: bite block Placement Confirmation: positive ETCO2 Tube secured with: Tape Dental Injury: Teeth and Oropharynx as per pre-operative assessment

## 2014-06-23 NOTE — Anesthesia Postprocedure Evaluation (Signed)
  Anesthesia Post-op Note  Patient: [name] Short  Procedure(s) Performed: Procedure(s) (LRB): BILATERAL URETEROSCOPY, HOLMIUM LASER LITHOTRIPSY AND STENT PLACEMENT (Bilateral)  Patient Location: PACU  Anesthesia Type: General  Level of Consciousness: awake and alert   Airway and Oxygen Therapy: Patient Spontanous Breathing  Post-op Pain: mild  Post-op Assessment: Post-op Vital signs reviewed, Patient's Cardiovascular Status Stable, Respiratory Function Stable, Patent Airway and No signs of Nausea or vomiting  Last Vitals:  Filed Vitals:   06/23/14 1005  BP: 132/74  Pulse: 65  Temp: 36.4 C  Resp: 16    Post-op Vital Signs: stable   Complications: No apparent anesthesia complications

## 2014-06-23 NOTE — H&P (Signed)
H&P   History of Present Illness: Patient is a 68 year old white male with bilateral stone disease. We treated his left stones in a staged fashion last year but he was seen recently in the office and had some left hydronephrosis. Further imaging with CT scan revealed he was passing a stone and several smaller pieces proximal to this. These were the UPJ and proximal ureter. Also, he has significant stone burden on the right with large lower pole stones then moved up into the renal pelvis. He presents today for bilateral ureteroscopy holmium laser lithotripsy and stents. We discussed he may need a staged procedure. He's had no fevers or dysuria. Of note he did pass a BB sized stone and a few small fragments yesterday and early this morning. These may been the stones from the left side. He is otherwise well today and has no complaints.  Past Medical History  Diagnosis Date  . History of kidney stones     hasn't had any since the 1990's  . Complication of anesthesia     slow to wake  . History of concussion     AS CHILD--  NO RESIDUAL  . Arthritis     WRIST  . History of gout   . Hyperlipidemia   . PAF (paroxysmal atrial fibrillation)     EPISODE --  2011  FOLLOW-UP W/ DR WALL  /  HAS NOT SEEN ANY CARDIOLOGIST SINCE  . Nephrolithiasis     BILATERAL  . At risk for sleep apnea     STOP-BANG=  4     SENT TO PCP 09-19-2013   Past Surgical History  Procedure Laterality Date  . 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 Total hip arthroplasty Right 11/15/2012    Procedure: RIGHT TOTAL HIP ARTHROPLASTY ANTERIOR APPROACH;  Surgeon: Eldred Manges, MD;  Location: [facility] OR;  Service: Orthopedics;  Laterality: Right;  Right total hip arthroplasty  . Appendectomy  1983  . Laparoscopic inguinal hernia repair Bilateral 09-04-2010    w/ mesh  . Extracorporeal shock wave lithotripsy  X2  . Transthoracic echocardiogram  05-08-2010    NORMAL LVSF/  EF 55%/  GRADE I DIASTOLIC DYSFUNCTION/  MILD BILATERAL  ATRIUM ENLARGEMENT  . Cardiovascular stress test  05-08-2010  DR WALL    NO EVIDENCE OF SCAR OR ISCHEMIA/ EF 54%/  PT HAD BOTH AFIB/ AFLUTTER DURING STUDY  . Tonsillectomy  AS CHILD  . Cystoscopy with ureteroscopy and stent placement Left 07/12/2013    Procedure: CYSTOSCOPY WITH LITHOLAPEXY, LEFT URETERAL STENT PLACEMENT;  Surgeon: Antony Haste, MD;  Location: [facility];  Service: Urology;  Laterality: Left;  . Extracorporeal shock wave lithotripsy Left 08-29-2013;   07-28-2013  . Cystoscopy with ureteroscopy Left 09/20/2013    Procedure: CYSTOSCOPY WITH URETEROSCOPY;  Surgeon: Antony Haste, MD;  Location: [facility];  Service: Urology;  Laterality: Left;  . Holmium laser application Left 09/20/2013    Procedure: HOLMIUM LASER APPLICATION;  Surgeon: Antony Haste, MD;  Location: [facility];  Service: Urology;  Laterality: Left;  . Cystoscopy w/ ureteral stent placement Left 09/20/2013    Procedure: CYSTOSCOPY WITH STENT REPLACEMENT;  Surgeon: Antony Haste, MD;  Location: [facility];  Service: Urology;  Laterality: Left;    Home Medications:  Prescriptions prior to admission  Medication Sig Dispense Refill  . aspirin 325 MG EC tablet Take 1 tablet (325 mg total) by mouth daily.      Marland Kitchen  Calcium Carbonate-Vitamin D (CALCIUM 600 + D PO) Take 1 tablet by mouth daily.      . fenofibrate 160 MG tablet Take 1 tablet by mouth  daily  90 tablet  0  . ibuprofen (ADVIL,MOTRIN) 200 MG tablet Take 2 tablets (400 mg total) by mouth 3 (three) times daily as needed for mild pain or moderate pain. For pain  30 tablet  0  . Multiple Vitamin (MULTIVITAMIN WITH MINERALS) TABS Take 1 tablet by mouth daily.      . naproxen sodium (ANAPROX) 220 MG tablet Take 2 tablets (440 mg total) by mouth 2 (two) times daily as needed (pain). For pain      . Omega 3 1000 MG CAPS Take by mouth.      .  oxyCODONE-acetaminophen (PERCOCET/ROXICET) 5-325 MG per tablet Take by mouth every 4 (four) hours as needed for severe pain.       Allergies:  Allergies  Allergen Reactions  . Warfarin Sodium Rash    History reviewed. No pertinent family history. Social History:  reports that he has been smoking Cigars.  He has never used smokeless tobacco. He reports that he drinks about 1.2 ounces of alcohol per week. He reports that he does not use illicit drugs.  ROS: A complete review of systems was performed.  All systems are negative except for pertinent findings as noted. ROS   Physical Exam:  Vital signs in last 24 hours: Temp:  [98.8 F (37.1 C)] 98.8 F (37.1 C) (09/25 0642) Pulse Rate:  [63] 63 (09/25 0642) Resp:  [14] 14 (09/25 0642) BP: (131)/(75) 131/75 mmHg (09/25 0642) SpO2:  [97 %] 97 % (09/25 0642) Weight:  [78.245 kg (172 lb 8 oz)] 78.245 kg (172 lb 8 oz) (09/25 0960) General:  Alert and oriented, No acute distress HEENT: Normocephalic, atraumatic Neck: No JVD or lymphadenopathy Cardiovascular: Regular rate and rhythm Lungs: Regular rate and effort Abdomen: Soft, nontender, nondistended, no abdominal masses Back: No CVA tenderness Extremities: No edema Neurologic: Grossly intact  Laboratory Data:  Results for orders placed during the hospital encounter of 06/23/14 (from the past 24 hour(s))  POCT I-STAT, CHEM 8     Status: Abnormal   Collection Time    06/23/14  7:06 AM      Result Value Ref Range   Sodium 144  137 - 147 mEq/L   Potassium 3.9  3.7 - 5.3 mEq/L   Chloride 105  96 - 112 mEq/L   BUN 27 (*) 6 - 23 mg/dL   Creatinine, Ser 4.54 (*) 0.50 - 1.35 mg/dL   Glucose, Bld 098 (*) 70 - 99 mg/dL   Calcium, Ion 1.19  1.47 - 1.30 mmol/L   TCO2 26  0 - 100 mmol/L   Hemoglobin 21.4 (*) 13.0 - 17.0 g/dL   HCT 82.9 (*) 56.2 - 13.0 %   Comment NOTIFIED PHYSICIAN     No results found for this or any previous visit (from the past 240 hour(s)). Creatinine:  Recent  Labs  06/23/14 0706  CREATININE 1.60*    Impression/Assessment/plan: -Bilateral nephrolithiasis-we discussed the nature risks benefits and alternatives to staged bilateral ureteroscopy with possible need for pre-stenting, holmium laser lithotripsy stone basket extraction. We discussed side effects of the proposed treatment, the likelihood of the patient achieving the goals of the procedure, and any potential problems that might occur during the procedure or recuperation. All questions answered. Patient elects to proceed. He did state if the stones on the left had passed  he wanted to continue surveillance of the left renal stones and focus on the right side. However if stones remain in the left ureter with possible obstruction of the left kidney and a might to address those as well as the right. Also discussed the i-STAT results with anesthesiologist and she reported the i-STAT machine has been having problems giving false results this week. She did not feel like he needed any further lab testing.    Jerilee Field 06/23/2014, 7:33 AM

## 2014-06-26 ENCOUNTER — Encounter (HOSPITAL_BASED_OUTPATIENT_CLINIC_OR_DEPARTMENT_OTHER): Payer: Self-pay | Admitting: Urology

## 2014-06-29 DIAGNOSIS — N200 Calculus of kidney: Secondary | ICD-10-CM | POA: Diagnosis not present

## 2014-07-07 DIAGNOSIS — Z23 Encounter for immunization: Secondary | ICD-10-CM | POA: Diagnosis not present

## 2014-07-13 ENCOUNTER — Encounter: Payer: Medicare Other | Admitting: Family Medicine

## 2014-07-14 ENCOUNTER — Ambulatory Visit (INDEPENDENT_AMBULATORY_CARE_PROVIDER_SITE_OTHER): Payer: Medicare Other | Admitting: Family Medicine

## 2014-07-14 ENCOUNTER — Encounter: Payer: Self-pay | Admitting: Family Medicine

## 2014-07-14 VITALS — BP 120/76 | HR 58 | Temp 98.0°F | Resp 16 | Ht 67.0 in | Wt 176.5 lb

## 2014-07-14 DIAGNOSIS — G5600 Carpal tunnel syndrome, unspecified upper limb: Secondary | ICD-10-CM | POA: Insufficient documentation

## 2014-07-14 DIAGNOSIS — N200 Calculus of kidney: Secondary | ICD-10-CM | POA: Diagnosis not present

## 2014-07-14 DIAGNOSIS — E783 Hyperchylomicronemia: Secondary | ICD-10-CM

## 2014-07-14 DIAGNOSIS — L578 Other skin changes due to chronic exposure to nonionizing radiation: Secondary | ICD-10-CM | POA: Diagnosis not present

## 2014-07-14 DIAGNOSIS — G5601 Carpal tunnel syndrome, right upper limb: Secondary | ICD-10-CM | POA: Diagnosis not present

## 2014-07-14 DIAGNOSIS — E785 Hyperlipidemia, unspecified: Secondary | ICD-10-CM | POA: Diagnosis not present

## 2014-07-14 DIAGNOSIS — Z23 Encounter for immunization: Secondary | ICD-10-CM

## 2014-07-14 DIAGNOSIS — L723 Sebaceous cyst: Secondary | ICD-10-CM | POA: Diagnosis not present

## 2014-07-14 DIAGNOSIS — Z0000 Encounter for general adult medical examination without abnormal findings: Secondary | ICD-10-CM

## 2014-07-14 LAB — LIPID PANEL
Cholesterol: 183 mg/dL (ref 0–200)
HDL: 33 mg/dL — ABNORMAL LOW (ref >39.00)
LDL Cholesterol: 122 mg/dL — ABNORMAL HIGH (ref 0–99)
NonHDL: 150
Total CHOL/HDL Ratio: 6
Triglycerides: 140 mg/dL (ref 0.0–149.0)
VLDL: 28 mg/dL (ref 0.0–40.0)

## 2014-07-14 LAB — CBC WITH DIFFERENTIAL/PLATELET
Basophils Absolute: 0.1 10*3/uL (ref 0.0–0.1)
Basophils Relative: 0.7 % (ref 0.0–3.0)
Eosinophils Absolute: 0.3 10*3/uL (ref 0.0–0.7)
Eosinophils Relative: 3.7 % (ref 0.0–5.0)
HCT: 39.5 % (ref 39.0–52.0)
Hemoglobin: 12.7 g/dL — ABNORMAL LOW (ref 13.0–17.0)
Lymphocytes Relative: 20.4 % (ref 12.0–46.0)
Lymphs Abs: 1.9 10*3/uL (ref 0.7–4.0)
MCHC: 32 g/dL (ref 30.0–36.0)
MCV: 90 fl (ref 78.0–100.0)
Monocytes Absolute: 0.6 10*3/uL (ref 0.1–1.0)
Monocytes Relative: 6.4 % (ref 3.0–12.0)
Neutro Abs: 6.4 10*3/uL (ref 1.4–7.7)
Neutrophils Relative %: 68.8 % (ref 43.0–77.0)
Platelets: 251 10*3/uL (ref 150.0–400.0)
RBC: 4.39 Mil/uL (ref 4.22–5.81)
RDW: 14 % (ref 11.5–15.5)
WBC: 9.3 10*3/uL (ref 4.0–10.5)

## 2014-07-14 LAB — BASIC METABOLIC PANEL
BUN: 23 mg/dL (ref 6–23)
CO2: 26 mEq/L (ref 19–32)
Calcium: 9.7 mg/dL (ref 8.4–10.5)
Chloride: 106 mEq/L (ref 96–112)
Creatinine, Ser: 1.6 mg/dL — ABNORMAL HIGH (ref 0.4–1.5)
GFR: 47.45 mL/min — ABNORMAL LOW (ref >60.00)
Glucose, Bld: 108 mg/dL — ABNORMAL HIGH (ref 70–99)
Potassium: 4.7 mEq/L (ref 3.5–5.1)
Sodium: 140 mEq/L (ref 135–145)

## 2014-07-14 LAB — HEPATIC FUNCTION PANEL
ALT: 28 U/L (ref 0–53)
AST: 29 U/L (ref 0–37)
Albumin: 4 g/dL (ref 3.5–5.2)
Alkaline Phosphatase: 50 U/L (ref 39–117)
Bilirubin, Direct: 0.1 mg/dL (ref 0.0–0.3)
Total Bilirubin: 0.8 mg/dL (ref 0.2–1.2)
Total Protein: 7.9 g/dL (ref 6.0–8.3)

## 2014-07-14 LAB — TSH: TSH: 1.63 u[IU]/mL (ref 0.35–4.50)

## 2014-07-14 NOTE — Patient Instructions (Signed)
Follow up in 6 months to recheck cholesterol We'll notify you of your lab results and make any changes if needed We'll call you with your Ortho and Derm appts Call when you want to have the cyst on your back removed and we can refer Call with any questions or concerns Happy Fall!!!

## 2014-07-14 NOTE — Progress Notes (Signed)
   Subjective:    Patient ID: [name] Short, male    DOB: [birth year], 68 y.o.   MRN: [id number]  HPI Here today for CPE.  Risk Factors: Hypertriglyceridemia- chronic problem, on Fenofibrate Kidney stones- following w/ Dr Mena GoesEskridge, s/p lithotripsy x2 and laser surgery.  Now w/ stent- pt hopes this comes out this afternoon. Physical Activity: exercising regularly, playing tennis Fall Risk: low risk Depression: denies current Hearing: normal to conversational tones and whispered voice at 866ft ADL's: independent Cognitive: normal linear thought process, memory and attention intact Home Safety: safe at home, lives w/ wife and father-in-law Height, Weight, BMI, Visual Acuity: see vitals, vision corrected to 20/20 w/ glasses Counseling: UTD on colonoscopy ([place]), following w/ urology. Labs Ordered: See A&P Care Plan: See A&P    Review of Systems Patient reports no vision/hearing changes, anorexia, fever ,adenopathy, persistant/recurrent hoarseness, swallowing issues, chest pain, palpitations, edema, persistant/recurrent cough, hemoptysis, dyspnea (rest,exertional, paroxysmal nocturnal), gastrointestinal  bleeding (melena, rectal bleeding), abdominal pain, excessive heart burn, GU symptoms (dysuria, hematuria, voiding/incontinence issues) syncope, focal weakness, memory loss, skin/hair/nail changes, depression, anxiety, abnormal bruising/bleeding, musculoskeletal symptoms/signs.   R hand numbness- pt reports constant numbness of thumb and 3 fingers since March/April.  Used to only occur after playing tennis.  Has seen Dr Butler DenmarkGrammig previously.  Sebaceous cyst on back- pt wants it removed    Objective:   Physical Exam General Appearance:    Alert, cooperative, no distress, appears stated age  Head:    Normocephalic, without obvious abnormality, atraumatic  Eyes:    PERRL, conjunctiva/corneas clear, EOM's intact, fundi    benign, both eyes       Ears:    Normal TM's and external ear  canals, both ears  Nose:   Nares normal, septum midline, mucosa normal, no drainage   or sinus tenderness  Throat:   Lips, mucosa, and tongue normal; teeth and gums normal  Neck:   Supple, symmetrical, trachea midline, no adenopathy;       thyroid:  No enlargement/tenderness/nodules  Back:     Symmetric, no curvature, ROM normal, no CVA tenderness  Lungs:     Clear to auscultation bilaterally, respirations unlabored  Chest wall:    No tenderness or deformity  Heart:    Regular rate and rhythm, S1 and S2 normal, no murmur, rub   or gallop  Abdomen:     Soft, non-tender, bowel sounds active all four quadrants,    no masses, no organomegaly  Genitalia:    Deferred to urology  Rectal:    Extremities:   Extremities normal, atraumatic, no cyanosis or edema  Pulses:   2+ and symmetric all extremities  Skin:   Skin color, texture, turgor normal, no rashes or lesions  Lymph nodes:   Cervical, supraclavicular, and axillary nodes normal  Neurologic:   CNII-XII intact. Normal strength, sensation and reflexes      throughout          Assessment & Plan:

## 2014-07-14 NOTE — Progress Notes (Signed)
Pre visit review using our clinic review tool, if applicable. No additional management support is needed unless otherwise documented below in the visit note. 

## 2014-07-16 NOTE — Assessment & Plan Note (Signed)
New. Refer to dermatology for evaluation. 

## 2014-07-16 NOTE — Assessment & Plan Note (Signed)
Pt's PE WNL w/ exception of carpal tunnel on R.  UTD on Urology and colonoscopy.  Written screening schedule updated and given to pt.  Check labs. Anticipatory guidance provided.

## 2014-07-16 NOTE — Assessment & Plan Note (Signed)
Chronic problem.  Pt tolerating medication w/o difficulty.  Check labs.  Adjust meds prn

## 2014-07-16 NOTE — Assessment & Plan Note (Signed)
Pt now having continuous numbness in median nerve distribution on R.  Refer to hand specialist for evaluation and tx.  Pt expressed understanding and is in agreement w/ plan.

## 2014-07-16 NOTE — Assessment & Plan Note (Signed)
New.  Currently small in the center of the back.  Pt is interested in having this removed when it gets larger.  Pt to call when he is ready.

## 2014-07-17 ENCOUNTER — Telehealth: Payer: Self-pay | Admitting: Family Medicine

## 2014-07-17 ENCOUNTER — Encounter: Payer: Self-pay | Admitting: General Practice

## 2014-07-17 NOTE — Telephone Encounter (Signed)
emmi mailed  °

## 2014-07-19 ENCOUNTER — Telehealth: Payer: Self-pay | Admitting: Family Medicine

## 2014-07-19 ENCOUNTER — Other Ambulatory Visit: Payer: Self-pay | Admitting: Family Medicine

## 2014-07-19 DIAGNOSIS — R7989 Other specified abnormal findings of blood chemistry: Secondary | ICD-10-CM

## 2014-07-19 NOTE — Telephone Encounter (Signed)
Caller name: Gerlene BurdockRichard I Relation to pt: Call back number: [phone] Pharmacy:  Reason for call: Pt is returning call from last friday 10.16.15 about lab results. Pt request return call before 5:30 today at the tel. number above. Please advise.

## 2014-07-19 NOTE — Telephone Encounter (Signed)
Pt notified of results. Lab appt scheduled.

## 2014-08-07 ENCOUNTER — Other Ambulatory Visit (INDEPENDENT_AMBULATORY_CARE_PROVIDER_SITE_OTHER): Payer: Medicare Other

## 2014-08-07 ENCOUNTER — Encounter: Payer: Self-pay | Admitting: General Practice

## 2014-08-07 DIAGNOSIS — R7989 Other specified abnormal findings of blood chemistry: Secondary | ICD-10-CM

## 2014-08-07 DIAGNOSIS — R748 Abnormal levels of other serum enzymes: Secondary | ICD-10-CM | POA: Diagnosis not present

## 2014-08-07 DIAGNOSIS — Z79891 Long term (current) use of opiate analgesic: Secondary | ICD-10-CM | POA: Diagnosis not present

## 2014-08-07 LAB — BASIC METABOLIC PANEL
BUN: 18 mg/dL (ref 6–23)
CO2: 29 mEq/L (ref 19–32)
Calcium: 9 mg/dL (ref 8.4–10.5)
Chloride: 105 mEq/L (ref 96–112)
Creatinine, Ser: 1.5 mg/dL (ref 0.4–1.5)
GFR: 48.15 mL/min — ABNORMAL LOW (ref >60.00)
Glucose, Bld: 102 mg/dL — ABNORMAL HIGH (ref 70–99)
Potassium: 3.6 mEq/L (ref 3.5–5.1)
Sodium: 141 mEq/L (ref 135–145)

## 2014-08-17 DIAGNOSIS — L570 Actinic keratosis: Secondary | ICD-10-CM | POA: Diagnosis not present

## 2014-08-17 DIAGNOSIS — L814 Other melanin hyperpigmentation: Secondary | ICD-10-CM | POA: Diagnosis not present

## 2014-08-17 DIAGNOSIS — L821 Other seborrheic keratosis: Secondary | ICD-10-CM | POA: Diagnosis not present

## 2014-08-17 DIAGNOSIS — L218 Other seborrheic dermatitis: Secondary | ICD-10-CM | POA: Diagnosis not present

## 2014-08-28 ENCOUNTER — Encounter: Payer: Self-pay | Admitting: Family Medicine

## 2014-09-07 ENCOUNTER — Other Ambulatory Visit: Payer: Self-pay | Admitting: Family Medicine

## 2014-09-07 NOTE — Telephone Encounter (Signed)
Med filled.  

## 2014-09-15 DIAGNOSIS — N2882 Megaloureter: Secondary | ICD-10-CM | POA: Diagnosis not present

## 2014-09-15 DIAGNOSIS — N200 Calculus of kidney: Secondary | ICD-10-CM | POA: Diagnosis not present

## 2014-10-09 IMAGING — CR DG CHEST 2V
2 series · 2 of 2 positions shown · non-contrast
Comparison: 09/02/2010

CLINICAL DATA: Preoperative respiratory exam for right hip
arthroplasty.  Smoking history.  Atrial fibrillation.

CHEST - 2 VIEW

[view not recorded (1 of 2)]
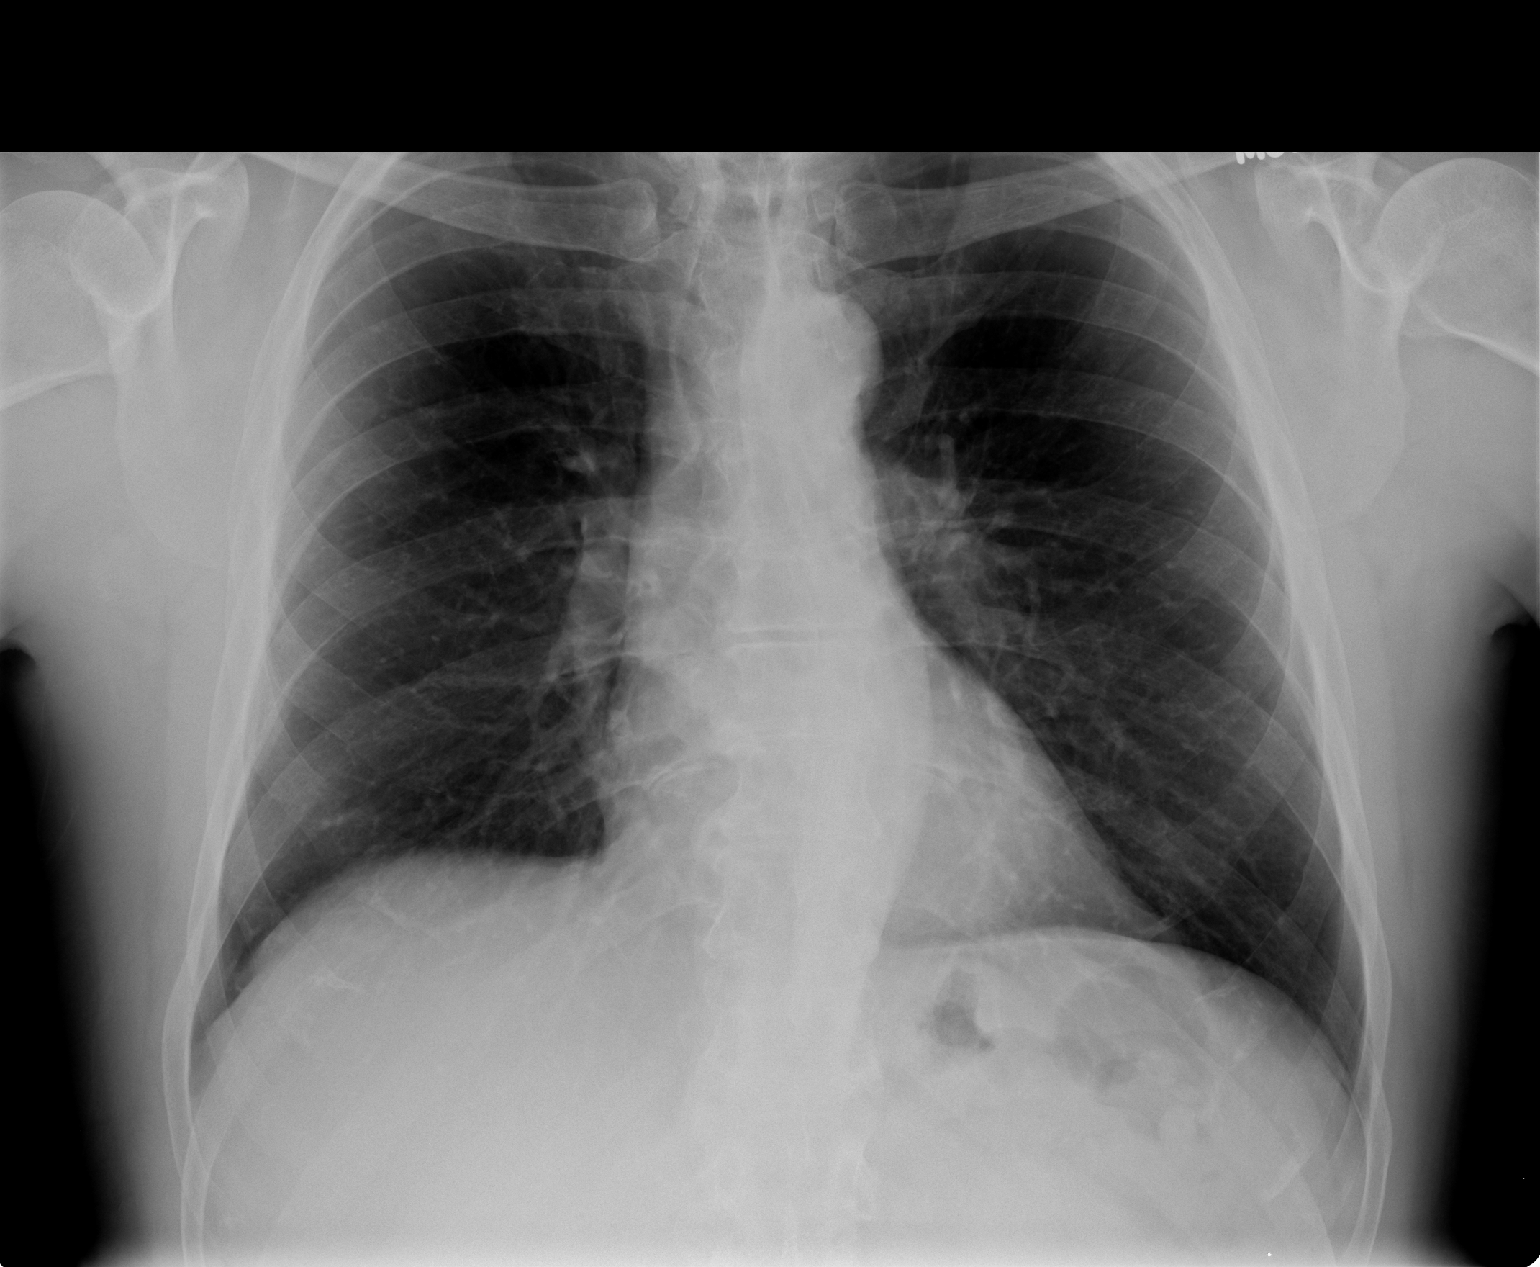

[view not recorded (2 of 2)]
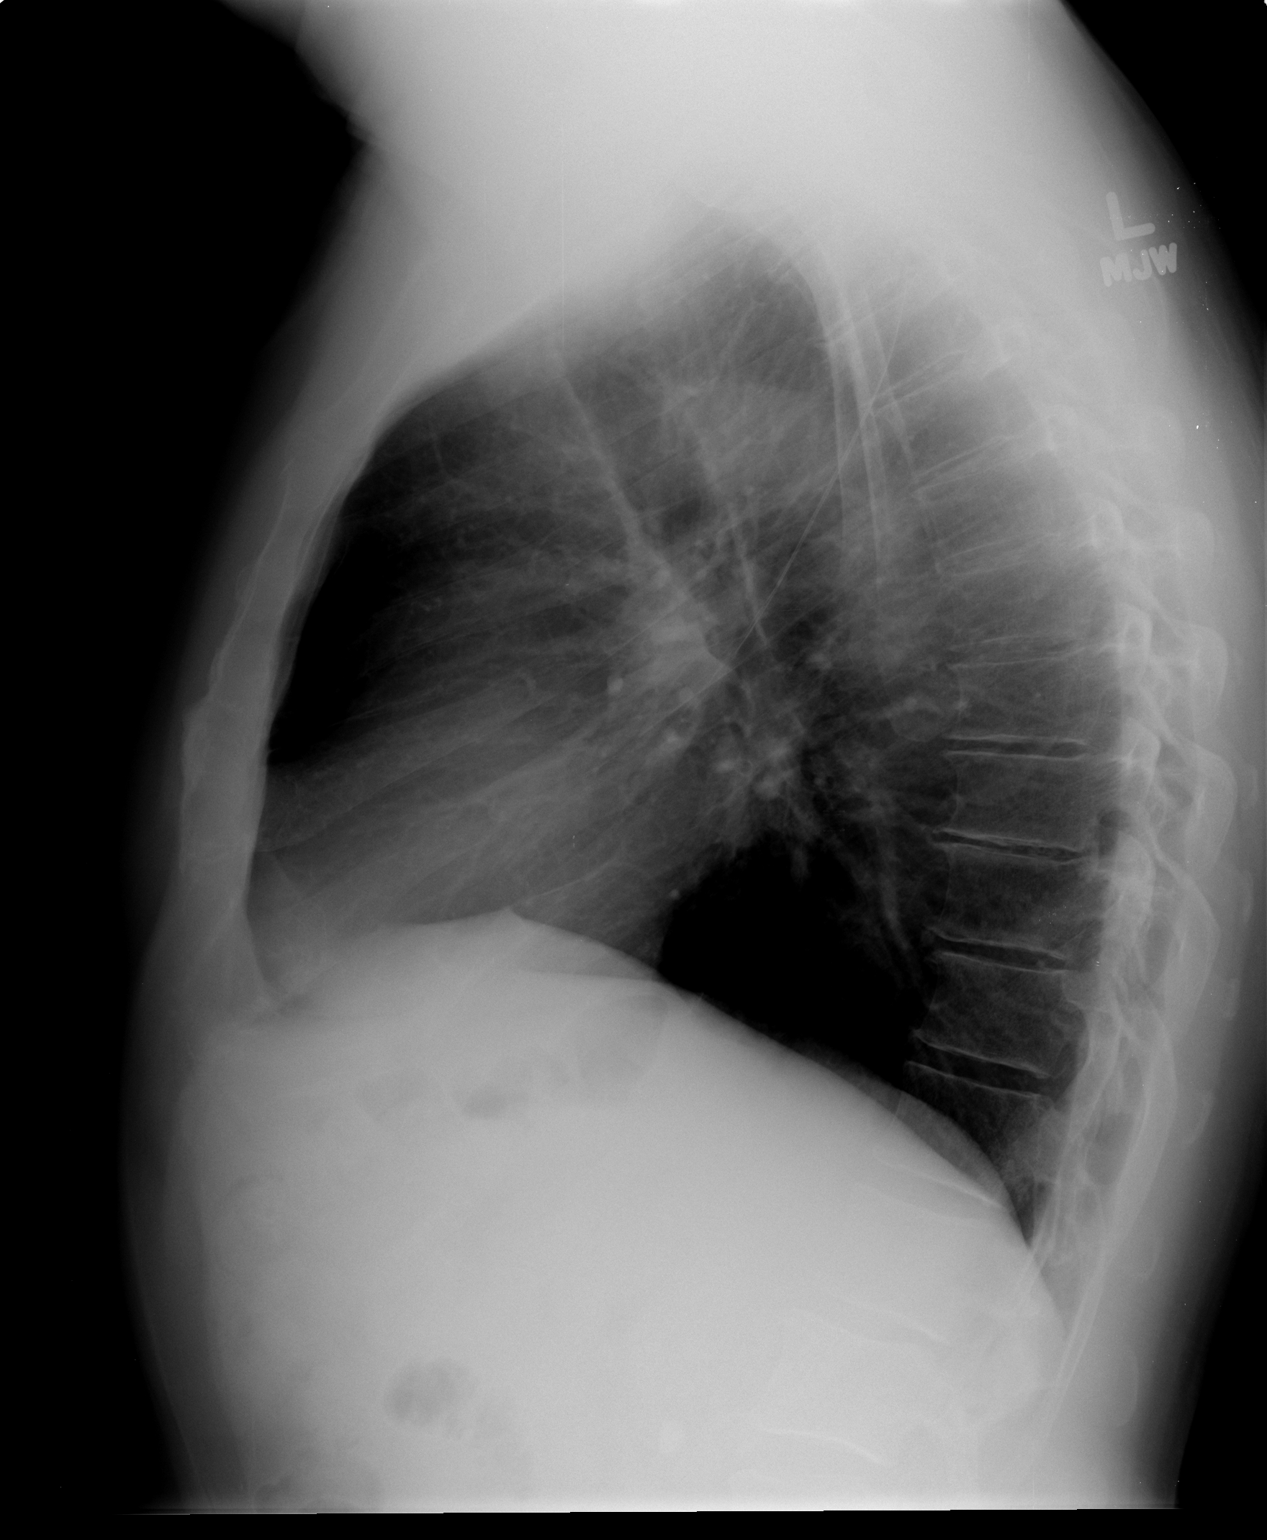

[2 of 2 positions shown; findings below may reference images not displayed]

FINDINGS: Heart size is normal.  There is mild unfolding of the
aorta.  The lungs are clear.  The vascularity is normal.  No
effusions.  Ordinary mild degenerative changes effect the spine.
IMPRESSION: No active disease

## 2014-10-14 IMAGING — RF DG HIP OPERATIVE*R*
1 series · 2 of 2 positions shown · non-contrast
Comparison: None.

CLINICAL DATA: Right hip anterior arthroplasty

DG OPERATIVE RIGHT HIP

[Series 1: run · 2 of 2 slices shown]
[im 1/2]
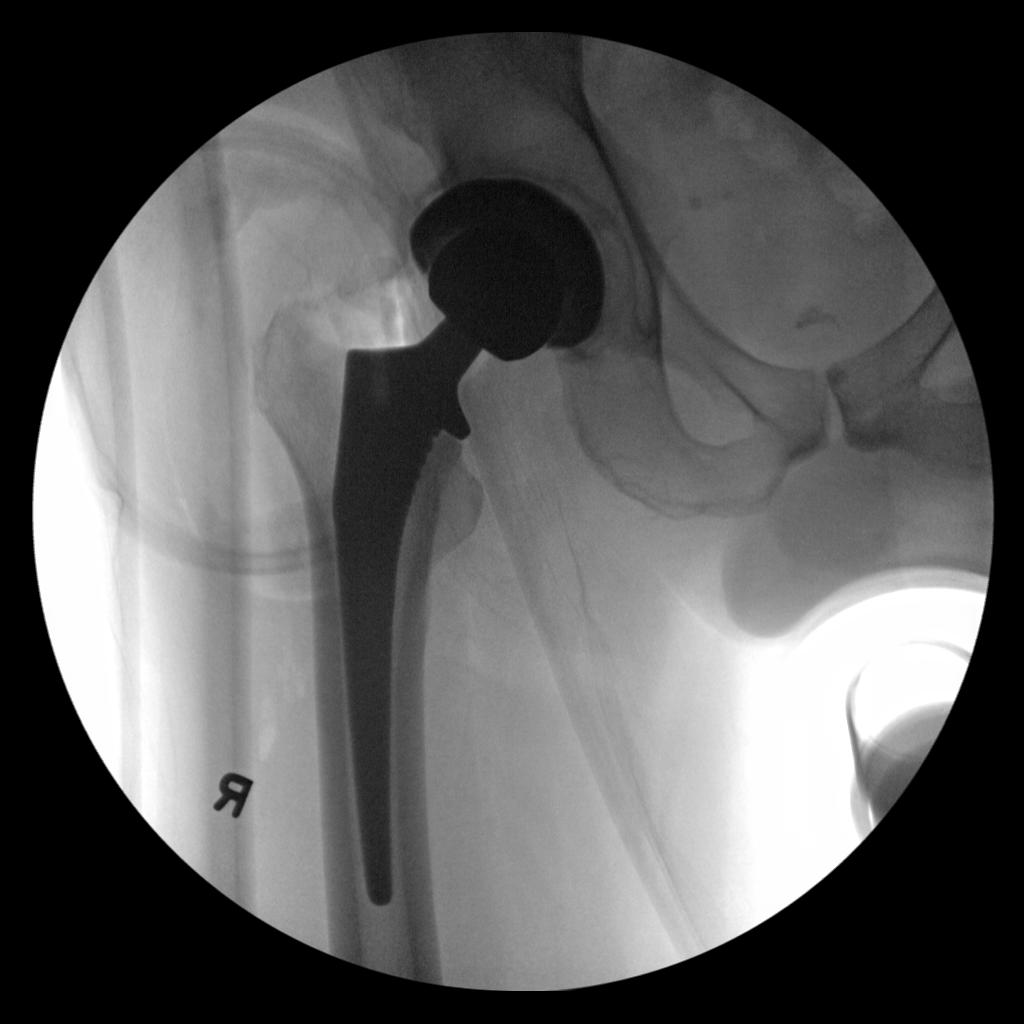
[im 2/2]
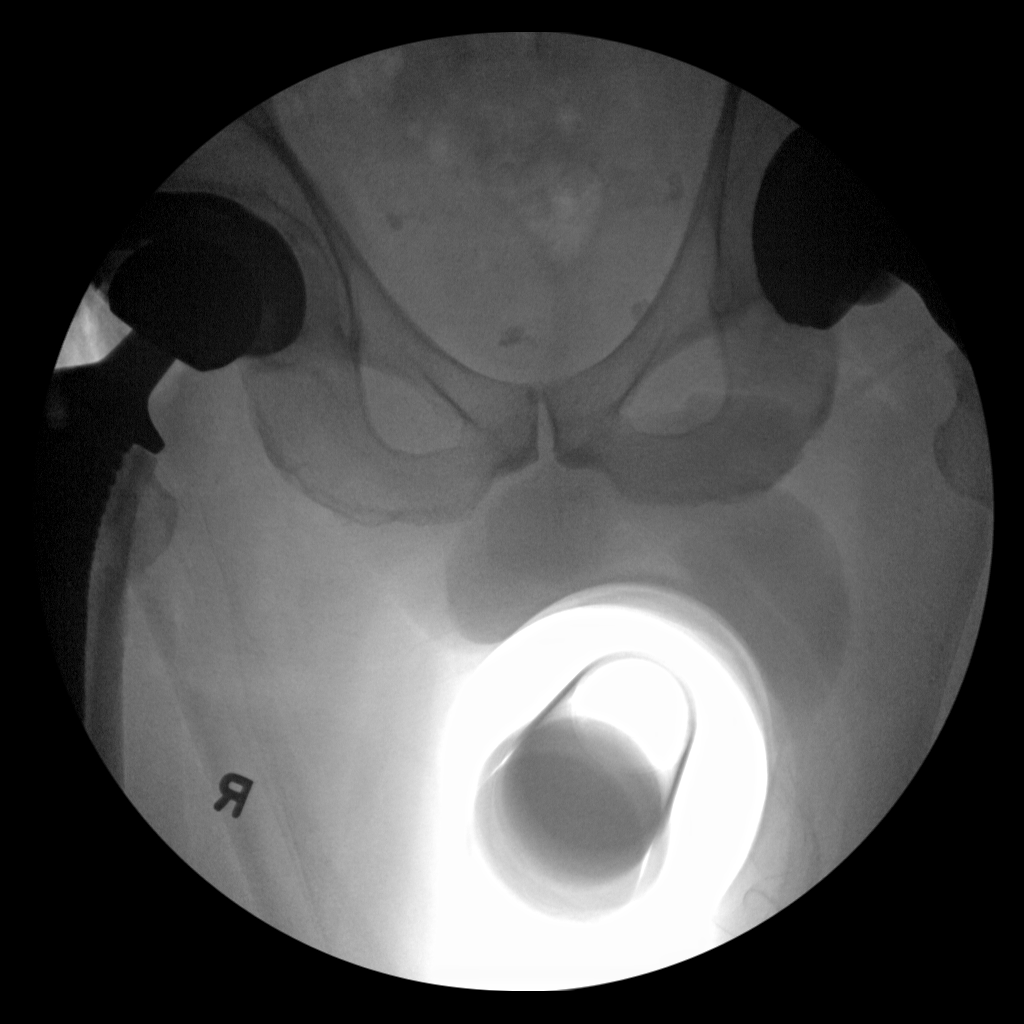

[2 of 2 positions shown; findings below may reference images not displayed]

FINDINGS: Two C-arm images show a hip replacement on the right.
Components appear well positioned without radiographically
detectable complication.
IMPRESSION: Hip replacement on the right.

## 2015-01-15 ENCOUNTER — Encounter: Payer: Self-pay | Admitting: Family Medicine

## 2015-01-15 ENCOUNTER — Ambulatory Visit (INDEPENDENT_AMBULATORY_CARE_PROVIDER_SITE_OTHER): Payer: Medicare Other | Admitting: Family Medicine

## 2015-01-15 ENCOUNTER — Other Ambulatory Visit: Payer: Self-pay | Admitting: Family Medicine

## 2015-01-15 VITALS — BP 124/84 | HR 60 | Temp 98.2°F | Resp 16 | Wt 181.1 lb

## 2015-01-15 DIAGNOSIS — G5601 Carpal tunnel syndrome, right upper limb: Secondary | ICD-10-CM

## 2015-01-15 DIAGNOSIS — R7989 Other specified abnormal findings of blood chemistry: Secondary | ICD-10-CM

## 2015-01-15 DIAGNOSIS — E783 Hyperchylomicronemia: Secondary | ICD-10-CM | POA: Diagnosis not present

## 2015-01-15 DIAGNOSIS — L723 Sebaceous cyst: Secondary | ICD-10-CM | POA: Diagnosis not present

## 2015-01-15 LAB — LIPID PANEL
Cholesterol: 152 mg/dL (ref 0–200)
HDL: 39 mg/dL — ABNORMAL LOW (ref >39.00)
LDL Cholesterol: 95 mg/dL (ref 0–99)
NonHDL: 113
Total CHOL/HDL Ratio: 4
Triglycerides: 89 mg/dL (ref 0.0–149.0)
VLDL: 17.8 mg/dL (ref 0.0–40.0)

## 2015-01-15 LAB — BASIC METABOLIC PANEL
BUN: 26 mg/dL — ABNORMAL HIGH (ref 6–23)
CO2: 31 mEq/L (ref 19–32)
Calcium: 10.2 mg/dL (ref 8.4–10.5)
Chloride: 103 mEq/L (ref 96–112)
Creatinine, Ser: 1.65 mg/dL — ABNORMAL HIGH (ref 0.40–1.50)
GFR: 44.4 mL/min — ABNORMAL LOW (ref >60.00)
Glucose, Bld: 93 mg/dL (ref 70–99)
Potassium: 4.8 mEq/L (ref 3.5–5.1)
Sodium: 139 mEq/L (ref 135–145)

## 2015-01-15 LAB — HEPATIC FUNCTION PANEL
ALT: 29 U/L (ref 0–53)
AST: 36 U/L (ref 0–37)
Albumin: 4.6 g/dL (ref 3.5–5.2)
Alkaline Phosphatase: 56 U/L (ref 39–117)
Bilirubin, Direct: 0.1 mg/dL (ref 0.0–0.3)
Total Bilirubin: 0.5 mg/dL (ref 0.2–1.2)
Total Protein: 7.6 g/dL (ref 6.0–8.3)

## 2015-01-15 NOTE — Assessment & Plan Note (Signed)
Chronic problem.  Pt is interested in seeing general surgery for definitive tx.  Referral entered.

## 2015-01-15 NOTE — Progress Notes (Signed)
Pre visit review using our clinic review tool, if applicable. No additional management support is needed unless otherwise documented below in the visit note. 

## 2015-01-15 NOTE — Progress Notes (Signed)
   Subjective:    Patient ID: [name] Short, male    DOB: 1945/10/27, 69 y.o.   MRN: [id number]  HPI Hyperlipidemia- chronic problem, on Fenofibrate.  Pt reports feeling well.  No abd pain, N/V, CP, SOB, HAs, edema.  Good compliance w/ meds.  Carpal tunnel sxs- R hand, pt was unable to f/u w/ ortho due to family issues.  Continues to have numbness.  Interested in new referral.  Sebaceous cyst on back- pt reports this ruptured 'a few weeks ago' and is still draining intermittently.  Denies pain or redness at the site   Review of Systems For ROS see HPI     Objective:   Physical Exam  Constitutional: He is oriented to person, place, and time. He appears well-developed and well-nourished. No distress.  HENT:  Head: Normocephalic and atraumatic.  Eyes: Conjunctivae and EOM are normal. Pupils are equal, round, and reactive to light.  Neck: Normal range of motion. Neck supple. No thyromegaly present.  Cardiovascular: Normal rate, regular rhythm, normal heart sounds and intact distal pulses.   No murmur heard. Pulmonary/Chest: Effort normal and breath sounds normal. No respiratory distress.  Abdominal: Soft. Bowel sounds are normal. He exhibits no distension.  Musculoskeletal: He exhibits no edema.  Lymphadenopathy:    He has no cervical adenopathy.  Neurological: He is alert and oriented to person, place, and time. No cranial nerve deficit.  Skin: Skin is warm and dry. No erythema.  Sebaceous cyst on back still visible.  No evidence of infxn.  Psychiatric: He has a normal mood and affect. His behavior is normal.  Vitals reviewed.         Assessment & Plan:

## 2015-01-15 NOTE — Assessment & Plan Note (Signed)
Chronic problem.  Tolerating fenofibrate w/o difficulty.  Check labs.  Adjust meds prn  

## 2015-01-15 NOTE — Assessment & Plan Note (Signed)
Chronic problem for pt.  He was unable to keep his ortho appt due to family issues- will re-refer as pt's sxs continue.  Pt expressed understanding and is in agreement w/ plan.

## 2015-01-15 NOTE — Patient Instructions (Signed)
Schedule your complete physical in 6 months We'll notify you of your lab results and make any changes if needed Keep up the good work!  You look great! We'll call you with your Ortho and Surgery appts Call with any questions or concerns Happy Spring!!!

## 2015-01-23 ENCOUNTER — Other Ambulatory Visit: Payer: BLUE CROSS/BLUE SHIELD

## 2015-02-05 DIAGNOSIS — Z872 Personal history of diseases of the skin and subcutaneous tissue: Secondary | ICD-10-CM | POA: Diagnosis not present

## 2015-03-05 DIAGNOSIS — R200 Anesthesia of skin: Secondary | ICD-10-CM | POA: Diagnosis not present

## 2015-03-05 DIAGNOSIS — M25531 Pain in right wrist: Secondary | ICD-10-CM | POA: Diagnosis not present

## 2015-03-05 DIAGNOSIS — G5601 Carpal tunnel syndrome, right upper limb: Secondary | ICD-10-CM | POA: Diagnosis not present

## 2015-03-05 DIAGNOSIS — R202 Paresthesia of skin: Secondary | ICD-10-CM | POA: Diagnosis not present

## 2015-03-10 ENCOUNTER — Other Ambulatory Visit: Payer: Self-pay | Admitting: Family Medicine

## 2015-03-12 NOTE — Telephone Encounter (Signed)
Med filled.  

## 2015-03-19 DIAGNOSIS — M25531 Pain in right wrist: Secondary | ICD-10-CM | POA: Diagnosis not present

## 2015-03-23 DIAGNOSIS — G5601 Carpal tunnel syndrome, right upper limb: Secondary | ICD-10-CM | POA: Diagnosis not present

## 2015-07-17 ENCOUNTER — Ambulatory Visit (INDEPENDENT_AMBULATORY_CARE_PROVIDER_SITE_OTHER): Payer: Medicare Other | Admitting: Family Medicine

## 2015-07-17 ENCOUNTER — Encounter: Payer: Self-pay | Admitting: Family Medicine

## 2015-07-17 VITALS — BP 122/80 | HR 67 | Temp 98.0°F | Resp 16 | Ht 66.0 in | Wt 177.5 lb

## 2015-07-17 DIAGNOSIS — Z0000 Encounter for general adult medical examination without abnormal findings: Secondary | ICD-10-CM | POA: Diagnosis not present

## 2015-07-17 DIAGNOSIS — E783 Hyperchylomicronemia: Secondary | ICD-10-CM | POA: Diagnosis not present

## 2015-07-17 DIAGNOSIS — Z125 Encounter for screening for malignant neoplasm of prostate: Secondary | ICD-10-CM | POA: Diagnosis not present

## 2015-07-17 DIAGNOSIS — I480 Paroxysmal atrial fibrillation: Secondary | ICD-10-CM

## 2015-07-17 DIAGNOSIS — Z23 Encounter for immunization: Secondary | ICD-10-CM | POA: Diagnosis not present

## 2015-07-17 LAB — CBC WITH DIFFERENTIAL/PLATELET
Basophils Absolute: 0 10*3/uL (ref 0.0–0.1)
Basophils Relative: 0.4 % (ref 0.0–3.0)
Eosinophils Absolute: 0.2 10*3/uL (ref 0.0–0.7)
Eosinophils Relative: 1.9 % (ref 0.0–5.0)
HCT: 43.5 % (ref 39.0–52.0)
Hemoglobin: 14.3 g/dL (ref 13.0–17.0)
Lymphocytes Relative: 27.6 % (ref 12.0–46.0)
Lymphs Abs: 2.3 10*3/uL (ref 0.7–4.0)
MCHC: 32.8 g/dL (ref 30.0–36.0)
MCV: 90 fl (ref 78.0–100.0)
Monocytes Absolute: 0.6 10*3/uL (ref 0.1–1.0)
Monocytes Relative: 7.5 % (ref 3.0–12.0)
Neutro Abs: 5.2 10*3/uL (ref 1.4–7.7)
Neutrophils Relative %: 62.6 % (ref 43.0–77.0)
Platelets: 242 10*3/uL (ref 150.0–400.0)
RBC: 4.83 Mil/uL (ref 4.22–5.81)
RDW: 13.2 % (ref 11.5–15.5)
WBC: 8.4 10*3/uL (ref 4.0–10.5)

## 2015-07-17 LAB — BASIC METABOLIC PANEL
BUN: 18 mg/dL (ref 6–23)
CO2: 32 mEq/L (ref 19–32)
Calcium: 10.2 mg/dL (ref 8.4–10.5)
Chloride: 104 mEq/L (ref 96–112)
Creatinine, Ser: 1.29 mg/dL (ref 0.40–1.50)
GFR: 58.9 mL/min — ABNORMAL LOW (ref >60.00)
Glucose, Bld: 105 mg/dL — ABNORMAL HIGH (ref 70–99)
Potassium: 5 mEq/L (ref 3.5–5.1)
Sodium: 141 mEq/L (ref 135–145)

## 2015-07-17 LAB — LIPID PANEL
Cholesterol: 177 mg/dL (ref 0–200)
HDL: 39.5 mg/dL (ref >39.00)
LDL Cholesterol: 112 mg/dL — ABNORMAL HIGH (ref 0–99)
NonHDL: 137.68
Total CHOL/HDL Ratio: 4
Triglycerides: 130 mg/dL (ref 0.0–149.0)
VLDL: 26 mg/dL (ref 0.0–40.0)

## 2015-07-17 LAB — HEPATIC FUNCTION PANEL
ALT: 27 U/L (ref 0–53)
AST: 30 U/L (ref 0–37)
Albumin: 4.6 g/dL (ref 3.5–5.2)
Alkaline Phosphatase: 48 U/L (ref 39–117)
Bilirubin, Direct: 0.1 mg/dL (ref 0.0–0.3)
Total Bilirubin: 0.6 mg/dL (ref 0.2–1.2)
Total Protein: 7.5 g/dL (ref 6.0–8.3)

## 2015-07-17 LAB — TSH: TSH: 1.57 u[IU]/mL (ref 0.35–4.50)

## 2015-07-17 LAB — PSA, MEDICARE: PSA: 1.59 ng/ml (ref 0.10–4.00)

## 2015-07-17 NOTE — Assessment & Plan Note (Signed)
Pt had Lone Afib 5 yrs ago and had complete cardiology workup at the time.  He has since been in sinus rhythm and not required medication for rate control.  He is on ASA 325mg  for anticoag.  Remains asymptomatic and has not seen cards recently.  Will continue to follow.

## 2015-07-17 NOTE — Assessment & Plan Note (Signed)
Chronic problem.  On Fenofibrate w/o difficulty.  Pt is exercising regularly.  Check labs.  Adjust meds prn

## 2015-07-17 NOTE — Patient Instructions (Signed)
Follow up in 6 months to recheck cholesterol We'll notify you of your lab results and make any changes if needed Keep up the good work on healthy diet and regular exercise- you look great! You are up to date on colonoscopy until 2018- yay! Call with any questions or concerns If you want to join us at the new [place] office, any scheduled appointments will automatically transfer and we will see you at [address], [place], [place] [id number] Happy Fall!!!

## 2015-07-17 NOTE — Progress Notes (Signed)
Pre visit review using our clinic review tool, if applicable. No additional management support is needed unless otherwise documented below in the visit note. 

## 2015-07-17 NOTE — Assessment & Plan Note (Signed)
Pt's PE WNL.  UTD on colonoscopy.  Written screening schedule updated and given to pt.  Flu shot given today.  Check labs.  Anticipatory guidance provided.

## 2015-07-17 NOTE — Progress Notes (Signed)
   Subjective:    Patient ID: [name] Short, male    DOB: [birth year], 69 y.o.   MRN: [id number]  HPI Here today for CPE.  Risk Factors: Lone Afib- pt is on ASA 325mg  daily.  Has not seen cards recently.  No recent palpitations or irregular HR.  No CP, SOB, HAs, visual changes. Hyperlipidemia- chronic problem, on Fenofibrate.  No abd pain, N/V. Physical Activity: plays tennis regularly Fall Risk: Low Depression: denies Hearing: normal to conversational tones, mildly decreased to whispered voice ADL's: independent Cognitive: normal linear thought process, memory and attention intact Home Safety: safe at home Height, Weight, BMI, Visual Acuity: see vitals, vision corrected to 20/20 w/ glasses Counseling: UTD on colonoscopy (due 2018) UTD w/ urology Care team reviewed and updated w/ pt Labs Ordered: See A&P Care Plan: See A&P    Review of Systems Patient reports no vision/hearing changes, anorexia, fever ,adenopathy, persistant/recurrent hoarseness, swallowing issues, chest pain, palpitations, edema, persistant/recurrent cough, hemoptysis, dyspnea (rest,exertional, paroxysmal nocturnal), gastrointestinal  bleeding (melena, rectal bleeding), abdominal pain, excessive heart burn, GU symptoms (dysuria, hematuria, voiding/incontinence issues) syncope, focal weakness, memory loss, numbness & tingling, skin/hair/nail changes, depression, anxiety, abnormal bruising/bleeding, musculoskeletal symptoms/signs.     Objective:   Physical Exam General Appearance:    Alert, cooperative, no distress, appears stated age  Head:    Normocephalic, without obvious abnormality, atraumatic  Eyes:    PERRL, conjunctiva/corneas clear, EOM's intact, fundi    benign, both eyes       Ears:    Normal TM's and external ear canals, both ears  Nose:   Nares normal, septum midline, mucosa normal, no drainage   or sinus tenderness  Throat:   Lips, mucosa, and tongue normal; teeth and gums normal  Neck:   Supple,  symmetrical, trachea midline, no adenopathy;       thyroid:  No enlargement/tenderness/nodules  Back:     Symmetric, no curvature, ROM normal, no CVA tenderness  Lungs:     Clear to auscultation bilaterally, respirations unlabored  Chest wall:    No tenderness or deformity  Heart:    Regular rate and rhythm, S1 and S2 normal, no murmur, rub   or gallop  Abdomen:     Soft, non-tender, bowel sounds active all four quadrants,    no masses, no organomegaly  Genitalia:    Deferred to urology  Rectal:    Extremities:   Extremities normal, atraumatic, no cyanosis or edema  Pulses:   2+ and symmetric all extremities  Skin:   Skin color, texture, turgor normal, no rashes or lesions  Lymph nodes:   Cervical, supraclavicular, and axillary nodes normal  Neurologic:   CNII-XII intact. Normal strength, sensation and reflexes      throughout          Assessment & Plan:

## 2015-07-18 ENCOUNTER — Encounter: Payer: Self-pay | Admitting: General Practice

## 2015-09-04 ENCOUNTER — Other Ambulatory Visit: Payer: Self-pay | Admitting: Family Medicine

## 2015-09-05 NOTE — Telephone Encounter (Signed)
Medication filled to pharmacy as requested.   

## 2016-01-15 ENCOUNTER — Encounter: Payer: Self-pay | Admitting: Family Medicine

## 2016-01-15 ENCOUNTER — Ambulatory Visit (INDEPENDENT_AMBULATORY_CARE_PROVIDER_SITE_OTHER): Payer: Medicare Other | Admitting: Family Medicine

## 2016-01-15 VITALS — BP 121/81 | HR 82 | Temp 98.0°F | Resp 16 | Ht 66.0 in | Wt 180.0 lb

## 2016-01-15 DIAGNOSIS — E783 Hyperchylomicronemia: Secondary | ICD-10-CM | POA: Diagnosis not present

## 2016-01-15 LAB — BASIC METABOLIC PANEL
BUN: 21 mg/dL (ref 6–23)
CO2: 29 mEq/L (ref 19–32)
Calcium: 9.5 mg/dL (ref 8.4–10.5)
Chloride: 107 mEq/L (ref 96–112)
Creatinine, Ser: 1.18 mg/dL (ref 0.40–1.50)
GFR: 65.19 mL/min (ref >60.00)
Glucose, Bld: 99 mg/dL (ref 70–99)
Potassium: 4.2 mEq/L (ref 3.5–5.1)
Sodium: 142 mEq/L (ref 135–145)

## 2016-01-15 LAB — LIPID PANEL
Cholesterol: 149 mg/dL (ref 0–200)
HDL: 37.7 mg/dL — ABNORMAL LOW (ref >39.00)
LDL Cholesterol: 91 mg/dL (ref 0–99)
NonHDL: 111.25
Total CHOL/HDL Ratio: 4
Triglycerides: 100 mg/dL (ref 0.0–149.0)
VLDL: 20 mg/dL (ref 0.0–40.0)

## 2016-01-15 LAB — HEPATIC FUNCTION PANEL
ALT: 35 U/L (ref 0–53)
AST: 31 U/L (ref 0–37)
Albumin: 4.4 g/dL (ref 3.5–5.2)
Alkaline Phosphatase: 46 U/L (ref 39–117)
Bilirubin, Direct: 0.1 mg/dL (ref 0.0–0.3)
Total Bilirubin: 0.5 mg/dL (ref 0.2–1.2)
Total Protein: 7.2 g/dL (ref 6.0–8.3)

## 2016-01-15 NOTE — Assessment & Plan Note (Signed)
Ongoing issue for pt.  On fenofibrate but not currently a statin.  Asymptomatic.  Discussed need for healthy diet and regular exercise.  Check labs.  Adjust meds prn.

## 2016-01-15 NOTE — Patient Instructions (Signed)
Schedule your complete physical in 6 months We'll notify you of your lab results and make any changes if needed Continue to work on healthy diet and regular exercise- you look great!!! Call with any questions or concerns Happy Spring!! 

## 2016-01-15 NOTE — Progress Notes (Signed)
   Subjective:    Patient ID: [name] Short, male    DOB: 14-Jan-1946, 70 y.o.   MRN: [id number]  HPI Hyperlipidemia- chronic problem, on Fenofibrate.  Playing tennis regularly.  No CP, SOB, HAs, visual changes, abd pain, N/V, myalgias.   Review of Systems For ROS see HPI     Objective:   Physical Exam  Constitutional: He is oriented to person, place, and time. He appears well-developed and well-nourished. No distress.  HENT:  Head: Normocephalic and atraumatic.  Eyes: Conjunctivae and EOM are normal. Pupils are equal, round, and reactive to light.  Neck: Normal range of motion. Neck supple. No thyromegaly present.  Cardiovascular: Normal rate, regular rhythm, normal heart sounds and intact distal pulses.   No murmur heard. Pulmonary/Chest: Effort normal and breath sounds normal. No respiratory distress.  Abdominal: Soft. Bowel sounds are normal. He exhibits no distension.  Musculoskeletal: He exhibits no edema.  Lymphadenopathy:    He has no cervical adenopathy.  Neurological: He is alert and oriented to person, place, and time. No cranial nerve deficit.  Skin: Skin is warm and dry.  Psychiatric: He has a normal mood and affect. His behavior is normal.  Vitals reviewed.         Assessment & Plan:

## 2016-01-15 NOTE — Progress Notes (Signed)
Pre visit review using our clinic review tool, if applicable. No additional management support is needed unless otherwise documented below in the visit note. 

## 2016-01-16 ENCOUNTER — Encounter: Payer: Self-pay | Admitting: General Practice

## 2016-01-23 ENCOUNTER — Other Ambulatory Visit: Payer: Self-pay | Admitting: Family Medicine

## 2016-01-23 NOTE — Telephone Encounter (Signed)
Medication filled to pharmacy as requested.   

## 2016-02-11 ENCOUNTER — Ambulatory Visit (HOSPITAL_BASED_OUTPATIENT_CLINIC_OR_DEPARTMENT_OTHER)
Admission: RE | Admit: 2016-02-11 | Discharge: 2016-02-11 | Disposition: A | Discharge status: Home / Self Care | Payer: Medicare Other | Source: Ambulatory Visit | Attending: Family Medicine | Admitting: Family Medicine

## 2016-02-11 ENCOUNTER — Encounter: Payer: Self-pay | Admitting: Family Medicine

## 2016-02-11 ENCOUNTER — Ambulatory Visit (INDEPENDENT_AMBULATORY_CARE_PROVIDER_SITE_OTHER): Payer: Medicare Other | Admitting: Family Medicine

## 2016-02-11 ENCOUNTER — Other Ambulatory Visit: Payer: Self-pay | Admitting: General Practice

## 2016-02-11 VITALS — BP 124/82 | HR 80 | Temp 98.3°F | Resp 17 | Ht 66.0 in | Wt 180.5 lb

## 2016-02-11 DIAGNOSIS — M19072 Primary osteoarthritis, left ankle and foot: Secondary | ICD-10-CM | POA: Diagnosis not present

## 2016-02-11 DIAGNOSIS — M7732 Calcaneal spur, left foot: Secondary | ICD-10-CM | POA: Insufficient documentation

## 2016-02-11 DIAGNOSIS — M79609 Pain in unspecified limb: Secondary | ICD-10-CM | POA: Diagnosis not present

## 2016-02-11 DIAGNOSIS — M79672 Pain in left foot: Secondary | ICD-10-CM | POA: Insufficient documentation

## 2016-02-11 LAB — CBC WITH DIFFERENTIAL/PLATELET
Basophils Absolute: 0 cells/uL (ref 0–200)
Basophils Relative: 0 %
Eosinophils Absolute: 218 cells/uL (ref 15–500)
Eosinophils Relative: 2 %
HCT: 41.2 % (ref 38.5–50.0)
Hemoglobin: 13.7 g/dL (ref 13.2–17.1)
Lymphocytes Relative: 20 %
Lymphs Abs: 2180 cells/uL (ref 850–3900)
MCH: 29.3 pg (ref 27.0–33.0)
MCHC: 33.3 g/dL (ref 32.0–36.0)
MCV: 88.2 fL (ref 80.0–100.0)
MPV: 10.6 fL (ref 7.5–12.5)
Monocytes Absolute: 1090 cells/uL — ABNORMAL HIGH (ref 200–950)
Monocytes Relative: 10 %
Neutro Abs: 7412 cells/uL (ref 1500–7800)
Neutrophils Relative %: 68 %
Platelets: 250 10*3/uL (ref 140–400)
RBC: 4.67 MIL/uL (ref 4.20–5.80)
RDW: 13 % (ref 11.0–15.0)
WBC: 10.9 10*3/uL — ABNORMAL HIGH (ref 3.8–10.8)

## 2016-02-11 LAB — URIC ACID: Uric Acid, Serum: 7.7 mg/dL (ref 4.0–7.8)

## 2016-02-11 MED ORDER — PREDNISONE 10 MG PO TABS: ORAL_TABLET | ORAL | Status: DC

## 2016-02-11 NOTE — Patient Instructions (Signed)
Please stop at the [facility] and get your Xray done Start the Prednisone as directed- take w/ food You can use Tylenol for breakthrough pain but do no take any Naproxen, Aleve, Ibuprofen while on the Prednisone Ice the foot for pain relief Call with any questions or concerns Hang in there!!!

## 2016-02-11 NOTE — Progress Notes (Signed)
Pre visit review using our clinic review tool, if applicable. No additional management support is needed unless otherwise documented below in the visit note. 

## 2016-02-11 NOTE — Progress Notes (Signed)
   Subjective:    Patient ID: [name] Short, male    DOB: 03-09-1946, 70 y.o.   MRN: [id number]  HPI Foot pain- L foot, sxs started ~1 week ago.  Feels it may be related to doing yard work on uneven ground but he did not have immediate pain and was able to play tennis on it last week.  Pain really developed Saturday.  No improvement w/ Naproxen or ice.  Pt ate steak Friday evening w/ red wine.  Foot is warm to the touch, red.   Review of Systems For ROS see HPI     Objective:   Physical Exam  Constitutional: He is oriented to person, place, and time. He appears well-developed and well-nourished. No distress.  HENT:  Head: Normocephalic and atraumatic.  Cardiovascular: Intact distal pulses.   Musculoskeletal: He exhibits edema (localized swelling over L lateral foot) and tenderness (TTP over L lateral foot at TMT joint).  Neurological: He is alert and oriented to person, place, and time.  Skin: Skin is warm and dry. There is erythema (mild redness over L lateral foot).  Psychiatric: He has a normal mood and affect. His behavior is normal. Thought content normal.  Vitals reviewed.         Assessment & Plan:

## 2016-02-12 NOTE — Assessment & Plan Note (Addendum)
New.  This appears to be consistent w/ gout but given the hx of mild inversion injury will get xray to assess.  Start prednisone.  Reviewed supportive care and red flags that should prompt return.  Pt expressed understanding and is in agreement w/ plan.

## 2016-07-18 ENCOUNTER — Ambulatory Visit (INDEPENDENT_AMBULATORY_CARE_PROVIDER_SITE_OTHER): Payer: Medicare Other | Admitting: Family Medicine

## 2016-07-18 ENCOUNTER — Encounter: Payer: Self-pay | Admitting: Family Medicine

## 2016-07-18 VITALS — BP 124/84 | HR 82 | Temp 98.2°F | Resp 16 | Ht 66.0 in | Wt 177.2 lb

## 2016-07-18 DIAGNOSIS — Z1159 Encounter for screening for other viral diseases: Secondary | ICD-10-CM

## 2016-07-18 DIAGNOSIS — Z125 Encounter for screening for malignant neoplasm of prostate: Secondary | ICD-10-CM

## 2016-07-18 DIAGNOSIS — I480 Paroxysmal atrial fibrillation: Secondary | ICD-10-CM

## 2016-07-18 DIAGNOSIS — Z0000 Encounter for general adult medical examination without abnormal findings: Secondary | ICD-10-CM | POA: Diagnosis not present

## 2016-07-18 DIAGNOSIS — E783 Hyperchylomicronemia: Secondary | ICD-10-CM

## 2016-07-18 DIAGNOSIS — N200 Calculus of kidney: Secondary | ICD-10-CM

## 2016-07-18 DIAGNOSIS — Z23 Encounter for immunization: Secondary | ICD-10-CM | POA: Diagnosis not present

## 2016-07-18 LAB — BASIC METABOLIC PANEL
BUN: 15 mg/dL (ref 6–23)
CO2: 30 mEq/L (ref 19–32)
Calcium: 9.8 mg/dL (ref 8.4–10.5)
Chloride: 106 mEq/L (ref 96–112)
Creatinine, Ser: 1.26 mg/dL (ref 0.40–1.50)
GFR: 60.34 mL/min (ref >60.00)
Glucose, Bld: 97 mg/dL (ref 70–99)
Potassium: 4.6 mEq/L (ref 3.5–5.1)
Sodium: 142 mEq/L (ref 135–145)

## 2016-07-18 LAB — CBC WITH DIFFERENTIAL/PLATELET
Basophils Absolute: 0.1 10*3/uL (ref 0.0–0.1)
Basophils Relative: 0.9 % (ref 0.0–3.0)
Eosinophils Absolute: 0.2 10*3/uL (ref 0.0–0.7)
Eosinophils Relative: 2.6 % (ref 0.0–5.0)
HCT: 43.6 % (ref 39.0–52.0)
Hemoglobin: 14.5 g/dL (ref 13.0–17.0)
Lymphocytes Relative: 29.4 % (ref 12.0–46.0)
Lymphs Abs: 2 10*3/uL (ref 0.7–4.0)
MCHC: 33.3 g/dL (ref 30.0–36.0)
MCV: 90.1 fl (ref 78.0–100.0)
Monocytes Absolute: 0.5 10*3/uL (ref 0.1–1.0)
Monocytes Relative: 7.3 % (ref 3.0–12.0)
Neutro Abs: 4.1 10*3/uL (ref 1.4–7.7)
Neutrophils Relative %: 59.8 % (ref 43.0–77.0)
Platelets: 243 10*3/uL (ref 150.0–400.0)
RBC: 4.84 Mil/uL (ref 4.22–5.81)
RDW: 13.2 % (ref 11.5–15.5)
WBC: 6.8 10*3/uL (ref 4.0–10.5)

## 2016-07-18 LAB — HEPATIC FUNCTION PANEL
ALT: 25 U/L (ref 0–53)
AST: 30 U/L (ref 0–37)
Albumin: 4.7 g/dL (ref 3.5–5.2)
Alkaline Phosphatase: 49 U/L (ref 39–117)
Bilirubin, Direct: 0.1 mg/dL (ref 0.0–0.3)
Total Bilirubin: 0.6 mg/dL (ref 0.2–1.2)
Total Protein: 7.4 g/dL (ref 6.0–8.3)

## 2016-07-18 LAB — LIPID PANEL
Cholesterol: 168 mg/dL (ref 0–200)
HDL: 37.9 mg/dL — ABNORMAL LOW (ref >39.00)
LDL Cholesterol: 102 mg/dL — ABNORMAL HIGH (ref 0–99)
NonHDL: 130.03
Total CHOL/HDL Ratio: 4
Triglycerides: 141 mg/dL (ref 0.0–149.0)
VLDL: 28.2 mg/dL (ref 0.0–40.0)

## 2016-07-18 LAB — PSA, MEDICARE: PSA: 1.56 ng/ml (ref 0.10–4.00)

## 2016-07-18 LAB — TSH: TSH: 1.88 u[IU]/mL (ref 0.35–4.50)

## 2016-07-18 NOTE — Assessment & Plan Note (Signed)
Pt's PE WNL.  UTD on colonoscopy- due next year.  Due to see Dr Mena GoesEskridge- referral placed.  Flu shot given.  Written screening schedule updated and given to pt.  Check labs.  Anticipatory guidance provided.

## 2016-07-18 NOTE — Progress Notes (Signed)
Pre visit review using our clinic review tool, if applicable. No additional management support is needed unless otherwise documented below in the visit note. 

## 2016-07-18 NOTE — Patient Instructions (Signed)
Follow up in 6 months to recheck cholesterol We'll notify you of your lab results and make any changes if needed We'll call you with your urology appt You are due for colonoscopy next year You got your flu shot today- yay! Keep up the good work on healthy diet and regular exercise- you look great! Call with any questions or concerns Happy Fall!!!

## 2016-07-18 NOTE — Assessment & Plan Note (Signed)
Chronic problem.  Pt has hx of lone Afib.  Asymptomatic at this time.  On ASA for anticoagulation.  No need for rate control at this time.  Will follow.

## 2016-07-18 NOTE — Progress Notes (Signed)
   Subjective:    Patient ID: [name] Short, male    DOB: Nov 03, 1945, 70 y.o.   MRN: [id number]  HPI Here today for CPE.  Risk Factors: Hyperlipidemia- chronic problem, on Fenofibrate daily.  Denies CP, SOB, abd pain, N/V, myalgias Paroxysmal Afib- pt saw Cards previously.  On ASA for anticoagulation.  Denies palpitations, CP, SOB Physical Activity: playing tennis regularly Fall Risk: low Depression: denies Hearing: normal to conversational tones and whispered voice at 6 ft ADL's: independent Cognitive: normal linear thought process, memory and attention intact Home Safety: safe at home Height, Weight, BMI, Visual Acuity: see vitals, vision corrected to 20/20 w/ glasses Counseling: UTD on colonoscopy- due next year.  Pt to get flu today.  Pt sees urology (due for appt) Care team reviewed and updated w/ pt Labs Ordered: See A&P Care Plan: See A&P    Review of Systems Patient reports no vision/hearing changes, anorexia, fever ,adenopathy, persistant/recurrent hoarseness, swallowing issues, chest pain, palpitations, edema, persistant/recurrent cough, hemoptysis, dyspnea (rest,exertional, paroxysmal nocturnal), gastrointestinal  bleeding (melena, rectal bleeding), abdominal pain, excessive heart burn, GU symptoms (dysuria, hematuria, voiding/incontinence issues) syncope, focal weakness, memory loss, numbness & tingling, skin/hair/nail changes, depression, anxiety, abnormal bruising/bleeding, musculoskeletal symptoms/signs.     Objective:   Physical Exam        Assessment & Plan:

## 2016-07-18 NOTE — Assessment & Plan Note (Signed)
Chronic problem.  Tolerating fenofibrate w/o difficulty.  Asymptomatic at this time.  Applauded his efforts at healthy diet and regular exercise.  Check labs.  Adjust meds prn

## 2016-07-19 LAB — HEPATITIS C ANTIBODY: HCV Ab: NEGATIVE

## 2016-07-21 ENCOUNTER — Encounter: Payer: Self-pay | Admitting: General Practice

## 2016-07-23 DIAGNOSIS — N401 Enlarged prostate with lower urinary tract symptoms: Secondary | ICD-10-CM | POA: Diagnosis not present

## 2016-07-23 DIAGNOSIS — N200 Calculus of kidney: Secondary | ICD-10-CM | POA: Diagnosis not present

## 2016-07-23 DIAGNOSIS — R3129 Other microscopic hematuria: Secondary | ICD-10-CM | POA: Diagnosis not present

## 2016-07-23 DIAGNOSIS — R351 Nocturia: Secondary | ICD-10-CM | POA: Diagnosis not present

## 2016-07-29 ENCOUNTER — Other Ambulatory Visit: Payer: Self-pay | Admitting: Family Medicine

## 2016-08-25 DIAGNOSIS — R351 Nocturia: Secondary | ICD-10-CM | POA: Diagnosis not present

## 2016-08-25 DIAGNOSIS — N401 Enlarged prostate with lower urinary tract symptoms: Secondary | ICD-10-CM | POA: Diagnosis not present

## 2016-08-25 DIAGNOSIS — N200 Calculus of kidney: Secondary | ICD-10-CM | POA: Diagnosis not present

## 2016-10-24 ENCOUNTER — Ambulatory Visit (INDEPENDENT_AMBULATORY_CARE_PROVIDER_SITE_OTHER): Payer: Medicare Other

## 2016-10-24 DIAGNOSIS — M79672 Pain in left foot: Secondary | ICD-10-CM | POA: Diagnosis not present

## 2016-10-24 DIAGNOSIS — M79671 Pain in right foot: Secondary | ICD-10-CM

## 2016-10-24 NOTE — Progress Notes (Signed)
(  Nurse visit only ) Patient comes in today to get new Gel heel pads. I gave patient a new set of gel heel inserts.

## 2016-10-24 NOTE — Progress Notes (Deleted)
(  Nurse visit only ) Patient comes in today to get new Gel heel pads. I gave patient a new set of gel heel inserts.  

## 2016-12-02 ENCOUNTER — Other Ambulatory Visit: Payer: Self-pay | Admitting: Family Medicine

## 2016-12-02 ENCOUNTER — Telehealth: Payer: Self-pay | Admitting: General Practice

## 2016-12-02 NOTE — Telephone Encounter (Signed)
Noted, med denied, with note to call urology.

## 2016-12-02 NOTE — Telephone Encounter (Signed)
Last OV 07/18/16 tamsulosin shows as historical last filled 10/11/16

## 2016-12-02 NOTE — Telephone Encounter (Signed)
He was getting this from urology

## 2017-01-20 ENCOUNTER — Other Ambulatory Visit: Payer: Self-pay | Admitting: Family Medicine

## 2017-01-23 ENCOUNTER — Encounter: Payer: Self-pay | Admitting: Family Medicine

## 2017-01-23 ENCOUNTER — Ambulatory Visit (INDEPENDENT_AMBULATORY_CARE_PROVIDER_SITE_OTHER): Payer: Medicare Other | Admitting: Family Medicine

## 2017-01-23 VITALS — BP 122/82 | HR 61 | Temp 98.1°F | Resp 16 | Ht 66.0 in | Wt 186.2 lb

## 2017-01-23 DIAGNOSIS — E6609 Other obesity due to excess calories: Secondary | ICD-10-CM | POA: Diagnosis not present

## 2017-01-23 DIAGNOSIS — E663 Overweight: Secondary | ICD-10-CM | POA: Insufficient documentation

## 2017-01-23 DIAGNOSIS — E783 Hyperchylomicronemia: Secondary | ICD-10-CM | POA: Diagnosis not present

## 2017-01-23 DIAGNOSIS — E669 Obesity, unspecified: Secondary | ICD-10-CM | POA: Insufficient documentation

## 2017-01-23 DIAGNOSIS — Z6830 Body mass index (BMI) 30.0-30.9, adult: Secondary | ICD-10-CM | POA: Diagnosis not present

## 2017-01-23 LAB — HEPATIC FUNCTION PANEL
ALT: 26 U/L (ref 0–53)
AST: 25 U/L (ref 0–37)
Albumin: 4.4 g/dL (ref 3.5–5.2)
Alkaline Phosphatase: 52 U/L (ref 39–117)
Bilirubin, Direct: 0.1 mg/dL (ref 0.0–0.3)
Total Bilirubin: 0.4 mg/dL (ref 0.2–1.2)
Total Protein: 6.8 g/dL (ref 6.0–8.3)

## 2017-01-23 LAB — BASIC METABOLIC PANEL
BUN: 16 mg/dL (ref 6–23)
CO2: 30 mEq/L (ref 19–32)
Calcium: 9.6 mg/dL (ref 8.4–10.5)
Chloride: 105 mEq/L (ref 96–112)
Creatinine, Ser: 1.31 mg/dL (ref 0.40–1.50)
GFR: 57.61 mL/min — ABNORMAL LOW (ref >60.00)
Glucose, Bld: 106 mg/dL — ABNORMAL HIGH (ref 70–99)
Potassium: 4.8 mEq/L (ref 3.5–5.1)
Sodium: 140 mEq/L (ref 135–145)

## 2017-01-23 LAB — LIPID PANEL
Cholesterol: 159 mg/dL (ref 0–200)
HDL: 35.7 mg/dL — ABNORMAL LOW (ref >39.00)
LDL Cholesterol: 102 mg/dL — ABNORMAL HIGH (ref 0–99)
NonHDL: 123.77
Total CHOL/HDL Ratio: 4
Triglycerides: 109 mg/dL (ref 0.0–149.0)
VLDL: 21.8 mg/dL (ref 0.0–40.0)

## 2017-01-23 NOTE — Progress Notes (Signed)
Pre visit review using our clinic review tool, if applicable. No additional management support is needed unless otherwise documented below in the visit note. 

## 2017-01-23 NOTE — Patient Instructions (Addendum)
Schedule your Wellness visit with Selena Batten in 6 months and a follow up with me around the same time We'll notify you of your lab results and make any changes if needed Continue to work on healthy diet and regular exercise- you can do it! Call with any questions or concerns Happy Spring!!!

## 2017-01-23 NOTE — Assessment & Plan Note (Signed)
Chronic problem.  On fenofibrate and fish oil.  Stressed need for healthy diet and regular exercise.  Check labs.  Adjust meds prn

## 2017-01-23 NOTE — Progress Notes (Signed)
   Subjective:    Patient ID: [name] Short, male    DOB: 1946/03/06, 71 y.o.   MRN: [id number]  HPI Hyperlipidemia- chronic problem.  On Fenofibrate and Fish Oil daily.  Pt has gained 9 lbs since last visit.  No CP, SOB, HAs, visual changes, abd pain, N/V.  Obese- pt has gained 9 lbs and BMI is now over 30 (30.06).  Continues to play tennis 'a couple of times a week'.  Pt reports wife has increased portion size- he is working on this.   Review of Systems For ROS see HPI     Objective:   Physical Exam  Constitutional: He is oriented to person, place, and time. He appears well-developed and well-nourished. No distress.  HENT:  Head: Normocephalic and atraumatic.  Eyes: Conjunctivae and EOM are normal. Pupils are equal, round, and reactive to light.  Neck: Normal range of motion. Neck supple. No thyromegaly present.  Cardiovascular: Normal rate, regular rhythm, normal heart sounds and intact distal pulses.   No murmur heard. Pulmonary/Chest: Effort normal and breath sounds normal. No respiratory distress.  Abdominal: Soft. Bowel sounds are normal. He exhibits no distension.  Musculoskeletal: He exhibits no edema.  Lymphadenopathy:    He has no cervical adenopathy.  Neurological: He is alert and oriented to person, place, and time. No cranial nerve deficit.  Skin: Skin is warm and dry.  Psychiatric: He has a normal mood and affect. His behavior is normal.  Vitals reviewed.         Assessment & Plan:

## 2017-01-23 NOTE — Assessment & Plan Note (Signed)
New.  Pt's BMI is now >30.  Stressed need for healthy diet and regular exercise.  Will continue to follow at future visits.

## 2017-01-26 ENCOUNTER — Encounter: Payer: Self-pay | Admitting: General Practice

## 2017-02-13 DIAGNOSIS — N5201 Erectile dysfunction due to arterial insufficiency: Secondary | ICD-10-CM | POA: Diagnosis not present

## 2017-02-13 DIAGNOSIS — R3912 Poor urinary stream: Secondary | ICD-10-CM | POA: Diagnosis not present

## 2017-02-13 DIAGNOSIS — N200 Calculus of kidney: Secondary | ICD-10-CM | POA: Diagnosis not present

## 2017-02-13 DIAGNOSIS — N401 Enlarged prostate with lower urinary tract symptoms: Secondary | ICD-10-CM | POA: Diagnosis not present

## 2017-03-30 DIAGNOSIS — N200 Calculus of kidney: Secondary | ICD-10-CM | POA: Diagnosis not present

## 2017-03-30 DIAGNOSIS — R3121 Asymptomatic microscopic hematuria: Secondary | ICD-10-CM | POA: Diagnosis not present

## 2017-03-30 MED FILL — OXYCODONE-ACETAMINOPHEN 5-3: 5-325 | 2 days supply | Qty: 15 | Fill #0

## 2017-04-07 ENCOUNTER — Encounter: Payer: Self-pay | Admitting: Internal Medicine

## 2017-06-08 ENCOUNTER — Other Ambulatory Visit: Payer: Self-pay | Admitting: Family Medicine

## 2017-06-11 ENCOUNTER — Telehealth: Payer: Self-pay | Admitting: *Deleted

## 2017-06-11 MED ORDER — TYPHOID VACCINE PO CPDR: 1.0000 | DELAYED_RELEASE_CAPSULE | ORAL | 0 refills | Status: DC

## 2017-06-11 NOTE — Telephone Encounter (Signed)
Prescription sent.  Ok to schedule Hep A next week

## 2017-06-11 NOTE — Telephone Encounter (Signed)
Patient's wife calling for Typhoid RX sent to [facility] for a trip to [place].   He also needs a Hep A vaccine.    Routing to provider for approval, then we will schedule patient for lab appointment next week.

## 2017-07-14 ENCOUNTER — Telehealth: Payer: Self-pay

## 2017-07-14 NOTE — Telephone Encounter (Signed)
Spoke with patients wife, Alvis Lemmings. Requesting patient to have AWV with [facility] on 07/23/17 @ 0800, prior to appt with PCP.  Wife states she is not sure he will continue to see Dr. Beverely Low since she was dismissed from practice.  Patient to return call regarding appointment time change.

## 2017-07-15 NOTE — Telephone Encounter (Signed)
Noted.  Pt is welcome to continue his care here if he desires as the issue was not with him but with his wife's behavior

## 2017-07-22 NOTE — Progress Notes (Addendum)
Subjective:   [name] Short is a 71 y.o. male who presents for Medicare Annual/Subsequent preventive examination.  Review of Systems:  No ROS.  Medicare Wellness Visit. Additional risk factors are reflected in the social history.  Cardiac Risk Factors include: advanced age (>2555men, 71>65 women);dyslipidemia;male gender;family history of premature cardiovascular disease   Sleep patterns: Sleeps 7 hours.  Home Safety/Smoke Alarms: Feels safe in home. Smoke alarms in place.  Living environment; residence and Firearm Safety: Lives with wife in 2 story home, lives on main level.  Seat Belt Safety/Bike Helmet: Wears seat belt.    Male:   CCS-Colonoscopy 07/15/2007, normal. Pt will consider, declines referral or cologuard today.  PSA-Followed by Urology  Lab Results  Component Value Date   PSA 1.56 07/18/2016   PSA 1.59 07/17/2015   PSA 1.20 06/08/2013       Objective:    Vitals: BP 130/81   Pulse 62   Temp 98 F (36.7 C) (Oral)   Resp 16   Ht 5\' 6"  (1.676 m)   Wt 180 lb 6 oz (81.8 kg)   SpO2 98%   BMI 29.11 kg/m   Body mass index is 29.11 kg/m.  Tobacco History  Smoking Status  . Current Every Day Smoker  . Years: 10.00  . Types: Cigars  Smokeless Tobacco  . Never Used    Comment: AVERAGE 3 CIGARS PER WEEK     Ready to quit: No Counseling given: Yes   Past Medical History:  Diagnosis Date  . Arthritis    WRIST  . At risk for sleep apnea    STOP-BANG=  4     SENT TO PCP 09-19-2013  . Complication of anesthesia    slow to wake  . History of concussion    AS CHILD--  NO RESIDUAL  . History of gout   . History of kidney stones    hasn't had any since the 1990's  . Hyperlipidemia   . Nephrolithiasis    BILATERAL  . PAF (paroxysmal atrial fibrillation) (HCC)    EPISODE --  2011  FOLLOW-UP W/ DR WALL  /  HAS NOT SEEN ANY CARDIOLOGIST SINCE   Past Surgical History:  Procedure Laterality Date  . APPENDECTOMY  1983  . CARDIOVASCULAR STRESS TEST   05-08-2010  DR WALL   NO EVIDENCE OF SCAR OR ISCHEMIA/ EF 54%/  PT HAD BOTH AFIB/ AFLUTTER DURING STUDY  . CYSTOSCOPY W/ URETERAL STENT PLACEMENT Left 09/20/2013   Procedure: CYSTOSCOPY WITH STENT REPLACEMENT;  Surgeon: Antony HasteMatthew Ramsey Eskridge, MD;  [facility] [place];  Service: Urology;  Laterality: Left;  . CYSTOSCOPY WITH URETEROSCOPY Left 09/20/2013   Procedure: CYSTOSCOPY WITH URETEROSCOPY;  Surgeon: Antony HasteMatthew Ramsey Eskridge, MD;  [facility] [place];  Service: Urology;  Laterality: Left;  . CYSTOSCOPY WITH URETEROSCOPY AND STENT PLACEMENT Left 07/12/2013   Procedure: CYSTOSCOPY WITH LITHOLAPEXY, LEFT URETERAL STENT PLACEMENT;  Surgeon: Antony HasteMatthew Ramsey Eskridge, MD;  [facility] [place];  Service: Urology;  Laterality: Left;  . CYSTOSCOPY WITH URETEROSCOPY AND STENT PLACEMENT Bilateral 06/23/2014   Procedure: BILATERAL URETEROSCOPY, HOLMIUM LASER LITHOTRIPSY AND STENT PLACEMENT, RIGHT ;  Surgeon: Jerilee FieldMatthew Eskridge, MD;  [facility] [place];  Service: Urology;  Laterality: Bilateral;  . EXTRACORPOREAL SHOCK WAVE LITHOTRIPSY  X2  . EXTRACORPOREAL SHOCK WAVE LITHOTRIPSY Left 08-29-2013;   07-28-2013  . HOLMIUM LASER APPLICATION Left 09/20/2013   Procedure: HOLMIUM LASER APPLICATION;  Surgeon: Antony HasteMatthew Ramsey Eskridge, MD;  Location: [facility];  Service: Urology;  Laterality: Left;  . LAPAROSCOPIC INGUINAL HERNIA REPAIR Bilateral 09-04-2010   w/ mesh  . TONSILLECTOMY  AS CHILD  . 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 TRANSTHORACIC ECHOCARDIOGRAM  05-08-2010   NORMAL LVSF/  EF 55%/  GRADE I DIASTOLIC DYSFUNCTION/  MILD BILATERAL ATRIUM ENLARGEMENT   History reviewed. No pertinent family  history. History  Sexual Activity  . Sexual activity: Not on file    Outpatient Encounter Prescriptions as of 07/23/2017  Medication Sig  . aspirin 325 MG EC tablet Take 1 tablet (325 mg total) by mouth daily.  . Calcium Carbonate-Vitamin D (CALCIUM 600 + D PO) Take 1 tablet by mouth daily.  . fenofibrate 160 MG tablet TAKE 1 TABLET BY MOUTH  DAILY  . ibuprofen (ADVIL,MOTRIN) 200 MG tablet Take 2 tablets (400 mg total) by mouth 3 (three) times daily as needed for mild pain or moderate pain. For pain  . Multiple Vitamin (MULTIVITAMIN WITH MINERALS) TABS Take 1 tablet by mouth daily.  . naproxen sodium (ANAPROX) 220 MG tablet Take 2 tablets (440 mg total) by mouth 2 (two) times daily as needed (pain). For pain  . Omega 3 1000 MG CAPS Take by mouth.  . tamsulosin (FLOMAX) 0.4 MG CAPS capsule   . [DISCONTINUED] BOOSTRIX 5-2.5-18.5 injection   . [DISCONTINUED] typhoid (VIVOTIF) DR capsule Take 1 capsule by mouth every other day.   No facility-administered encounter medications on file as of 07/23/2017.     Activities of Daily Living In your present state of health, do you have any difficulty performing the following activities: 07/23/2017 07/23/2017  Hearing? N N  Vision? N N  Difficulty concentrating or making decisions? N N  Walking or climbing stairs? N N  Dressing or bathing? N N  Doing errands, shopping? N N  Preparing Food and eating ? N -  Using the Toilet? N -  In the past six months, have you accidently leaked urine? N -  Do you have problems with loss of bowel control? N -  Managing your Medications? N -  Managing your Finances? N -  Housekeeping or managing your Housekeeping? N -  Some recent data might be hidden    Patient Care Team: Sheliah Hatch, MD as PCP - General Jerilee Field, MD as Consulting Physician (Urology) Eldred Manges, MD as Consulting Physician (Orthopedic Surgery) Iva Boop, MD as Consulting Physician (Gastroenterology) Aris Lot, MD as Consulting Physician (Dermatology) Harriette Bouillon, MD as Consulting Physician (General Surgery) Abigail Miyamoto, MD as Consulting Physician (General Surgery)   Assessment:    Physical assessment deferred to PCP.  Exercise Activities and Dietary recommendations Current Exercise Habits: Home exercise routine, Type of exercise: Other - see comments (tennis), Time (Minutes): > 60, Frequency (Times/Week): 3, Weekly Exercise (Minutes/Week): 0, Exercise limited by: None identified   Diet (meal preparation, eat out, water intake, caffeinated beverages, dairy products, fruits and vegetables): Drinks Coke and water.   Breakfast: bagel; egg; biscuit Lunch: burger/sandwich; skips Dinner: protein and vegetables.    Goals      Patient Stated   . patient (pt-stated)          Maintain current health by staying active.       Fall Risk Fall Risk  07/23/2017 07/23/2017 07/18/2016 01/15/2016 07/17/2015  Falls in the past year? No No No No No   Depression Screen PHQ 2/9 Scores 07/23/2017 07/23/2017 01/23/2017 07/18/2016  PHQ - 2 Score 0 0 0 0  PHQ- 9 Score - 0 0 -    Cognitive Function       Ad8 score reviewed for issues:  Issues making decisions: no  Less interest in hobbies / activities: no  Repeats questions, stories (family complaining): no  Trouble using ordinary gadgets (microwave, computer, phone): no  Forgets the month or year: no  Mismanaging finances: no  Remembering appts: no  Daily problems with thinking and/or memory: no Ad8 score is=0     Immunization History  Administered Date(s) Administered  . Influenza Split 08/24/2012  . Influenza Whole 08/26/2010  . Influenza, High Dose Seasonal PF 07/07/2014  . Influenza,inj,Quad PF,6+ Mos 06/08/2013, 07/17/2015, 07/18/2016  . Pneumococcal Conjugate-13 07/14/2014  . Pneumococcal Polysaccharide-23 06/08/2013  . Tdap 09/16/2016  . Zoster 05/17/2012   Screening Tests Health Maintenance  Topic Date  Due  . INFLUENZA VACCINE  04/29/2017  . COLONOSCOPY  07/14/2017  . TETANUS/TDAP  09/16/2026  . Hepatitis C Screening  Completed  . PNA vac Low Risk Adult  Completed      Plan:    Bring a copy of your living will and/or healthcare power of attorney to your next office visit.  Continue doing brain stimulating activities (puzzles, reading, adult coloring books, staying active) to keep memory sharp.    Let us know about Shingles vaccine.   I have personally reviewed and noted the following in the patient's chart:   . Medical and social history . Use of alcohol, tobacco or illicit drugs  . Current medications and supplements . Functional ability and status . Nutritional status . Physical activity . Advanced directives . List of other physicians . Hospitalizations, surgeries, and ER visits in previous 12 months . Vitals . Screenings to include cognitive, depression, and falls . Referrals and appointments  In addition, I have reviewed and discussed with patient certain preventive protocols, quality metrics, and best practice recommendations. A written personalized care plan for preventive services as well as general preventive health recommendations were provided to patient.     Alysia Penna, RN  07/23/2017  Reviewed documentation provided by RN and agree w/ above.  Neena Rhymes, MD

## 2017-07-23 ENCOUNTER — Ambulatory Visit (INDEPENDENT_AMBULATORY_CARE_PROVIDER_SITE_OTHER): Payer: Medicare Other | Admitting: Family Medicine

## 2017-07-23 ENCOUNTER — Ambulatory Visit: Payer: Medicare Other

## 2017-07-23 ENCOUNTER — Encounter: Payer: Self-pay | Admitting: Family Medicine

## 2017-07-23 VITALS — BP 130/81 | HR 62 | Temp 98.0°F | Resp 16 | Ht 66.0 in | Wt 180.4 lb

## 2017-07-23 DIAGNOSIS — E783 Hyperchylomicronemia: Secondary | ICD-10-CM

## 2017-07-23 DIAGNOSIS — Z0000 Encounter for general adult medical examination without abnormal findings: Secondary | ICD-10-CM | POA: Diagnosis not present

## 2017-07-23 DIAGNOSIS — Z125 Encounter for screening for malignant neoplasm of prostate: Secondary | ICD-10-CM | POA: Diagnosis not present

## 2017-07-23 DIAGNOSIS — E785 Hyperlipidemia, unspecified: Secondary | ICD-10-CM | POA: Diagnosis not present

## 2017-07-23 DIAGNOSIS — Z23 Encounter for immunization: Secondary | ICD-10-CM | POA: Diagnosis not present

## 2017-07-23 LAB — LIPID PANEL
Cholesterol: 193 mg/dL (ref 0–200)
HDL: 43.9 mg/dL (ref >39.00)
LDL Cholesterol: 121 mg/dL — ABNORMAL HIGH (ref 0–99)
NonHDL: 148.84
Total CHOL/HDL Ratio: 4
Triglycerides: 138 mg/dL (ref 0.0–149.0)
VLDL: 27.6 mg/dL (ref 0.0–40.0)

## 2017-07-23 LAB — CBC WITH DIFFERENTIAL/PLATELET
Basophils Absolute: 0.1 10*3/uL (ref 0.0–0.1)
Basophils Relative: 0.9 % (ref 0.0–3.0)
Eosinophils Absolute: 0.2 10*3/uL (ref 0.0–0.7)
Eosinophils Relative: 2.7 % (ref 0.0–5.0)
HCT: 44.7 % (ref 39.0–52.0)
Hemoglobin: 14.7 g/dL (ref 13.0–17.0)
Lymphocytes Relative: 28.3 % (ref 12.0–46.0)
Lymphs Abs: 2.1 10*3/uL (ref 0.7–4.0)
MCHC: 32.8 g/dL (ref 30.0–36.0)
MCV: 91.9 fl (ref 78.0–100.0)
Monocytes Absolute: 0.7 10*3/uL (ref 0.1–1.0)
Monocytes Relative: 9.2 % (ref 3.0–12.0)
Neutro Abs: 4.3 10*3/uL (ref 1.4–7.7)
Neutrophils Relative %: 58.9 % (ref 43.0–77.0)
Platelets: 240 10*3/uL (ref 150.0–400.0)
RBC: 4.86 Mil/uL (ref 4.22–5.81)
RDW: 13.1 % (ref 11.5–15.5)
WBC: 7.3 10*3/uL (ref 4.0–10.5)

## 2017-07-23 LAB — HEPATIC FUNCTION PANEL
ALT: 22 U/L (ref 0–53)
AST: 25 U/L (ref 0–37)
Albumin: 4.7 g/dL (ref 3.5–5.2)
Alkaline Phosphatase: 56 U/L (ref 39–117)
Bilirubin, Direct: 0.1 mg/dL (ref 0.0–0.3)
Total Bilirubin: 0.4 mg/dL (ref 0.2–1.2)
Total Protein: 7.4 g/dL (ref 6.0–8.3)

## 2017-07-23 LAB — BASIC METABOLIC PANEL
BUN: 30 mg/dL — ABNORMAL HIGH (ref 6–23)
CO2: 30 mEq/L (ref 19–32)
Calcium: 10.1 mg/dL (ref 8.4–10.5)
Chloride: 104 mEq/L (ref 96–112)
Creatinine, Ser: 1.69 mg/dL — ABNORMAL HIGH (ref 0.40–1.50)
GFR: 42.87 mL/min — ABNORMAL LOW (ref >60.00)
Glucose, Bld: 110 mg/dL — ABNORMAL HIGH (ref 70–99)
Potassium: 5.5 mEq/L — ABNORMAL HIGH (ref 3.5–5.1)
Sodium: 141 mEq/L (ref 135–145)

## 2017-07-23 LAB — PSA, MEDICARE: PSA: 1.6 ng/ml (ref 0.10–4.00)

## 2017-07-23 NOTE — Progress Notes (Signed)
   Subjective:    Patient ID: [name] Short, male    DOB: [birth year], 71 y.o.   MRN: [id number]  HPI Hyperlipidemia- chronic problem.  On Fenofibrate 160mg  and fish oil daily.  Pt is down 6 lbs from April.  Continues to play tennis.  No CP, SOB, abd pain, N/V.   Review of Systems For ROS see HPI     Objective:   Physical Exam  Constitutional: He is oriented to person, place, and time. He appears well-developed and well-nourished. No distress.  HENT:  Head: Normocephalic and atraumatic.  Eyes: Pupils are equal, round, and reactive to light. Conjunctivae and EOM are normal.  Neck: Normal range of motion. Neck supple. No thyromegaly present.  Cardiovascular: Normal rate, regular rhythm, normal heart sounds and intact distal pulses.   No murmur heard. Pulmonary/Chest: Effort normal and breath sounds normal. No respiratory distress.  Abdominal: Soft. Bowel sounds are normal. He exhibits no distension.  Musculoskeletal: He exhibits no edema.  Lymphadenopathy:    He has no cervical adenopathy.  Neurological: He is alert and oriented to person, place, and time. No cranial nerve deficit.  Skin: Skin is warm and dry.  Psychiatric: He has a normal mood and affect. His behavior is normal.  Vitals reviewed.         Assessment & Plan:

## 2017-07-23 NOTE — Patient Instructions (Addendum)
Follow up in 6 months to recheck cholesterol We'll notify you of your lab results and make any changes if needed Keep up the good work on healthy diet and regular exercise- you look great!! Call with any questions or concerns Happy Fall!!!  Bring a copy of your living will and/or healthcare power of attorney to your next office visit.  Continue doing brain stimulating activities (puzzles, reading, adult coloring books, staying active) to keep memory sharp.   Let us know about Shingles vaccine.   Health Maintenance, Male A healthy lifestyle and preventive care is important for your health and wellness. Ask your health care provider about what schedule of regular examinations is right for you. What should I know about weight and diet? Eat a Healthy Diet  Eat plenty of vegetables, fruits, whole grains, low-fat dairy products, and lean protein.  Do not eat a lot of foods high in solid fats, added sugars, or salt.  Maintain a Healthy Weight Regular exercise can help you achieve or maintain a healthy weight. You should:  Do at least 150 minutes of exercise each week. The exercise should increase your heart rate and make you sweat (moderate-intensity exercise).  Do strength-training exercises at least twice a week.  Watch Your Levels of Cholesterol and Blood Lipids  Have your blood tested for lipids and cholesterol every 5 years starting at 70 years of age. If you are at high risk for heart disease, you should start having your blood tested when you are 70 years old. You may need to have your cholesterol levels checked more often if: ? Your lipid or cholesterol levels are high. ? You are older than 70 years of age. ? You are at high risk for heart disease.  What should I know about cancer screening? Many types of cancers can be detected early and may often be prevented. Lung Cancer  You should be screened every year for lung cancer if: ? You are a current smoker who has smoked for  at least 30 years. ? You are a former smoker who has quit within the past 15 years.  Talk to your health care provider about your screening options, when you should start screening, and how often you should be screened.  Colorectal Cancer  Routine colorectal cancer screening usually begins at 70 years of age and should be repeated every 5-10 years until you are 70 years old. You may need to be screened more often if early forms of precancerous polyps or small growths are found. Your health care provider may recommend screening at an earlier age if you have risk factors for colon cancer.  Your health care provider may recommend using home test kits to check for hidden blood in the stool.  A small camera at the end of a tube can be used to examine your colon (sigmoidoscopy or colonoscopy). This checks for the earliest forms of colorectal cancer.  Prostate and Testicular Cancer  Depending on your age and overall health, your health care provider may do certain tests to screen for prostate and testicular cancer.  Talk to your health care provider about any symptoms or concerns you have about testicular or prostate cancer.  Skin Cancer  Check your skin from head to toe regularly.  Tell your health care provider about any new moles or changes in moles, especially if: ? There is a change in a mole's size, shape, or color. ? You have a mole that is larger than a pencil eraser.  Always use  sunscreen. Apply sunscreen liberally and repeat throughout the day.  Protect yourself by wearing long sleeves, pants, a wide-brimmed hat, and sunglasses when outside.  What should I know about heart disease, diabetes, and high blood pressure?  If you are 4618-70 years of age, have your blood pressure checked every 3-5 years. If you are 70 years of age or older, have your blood pressure checked every year. You should have your blood pressure measured twice-once when you are at a hospital or clinic, and once  when you are not at a hospital or clinic. Record the average of the two measurements. To check your blood pressure when you are not at a hospital or clinic, you can use: ? An automated blood pressure machine at a pharmacy. ? A home blood pressure monitor.  Talk to your health care provider about your target blood pressure.  If you are between 7245-70 years old, ask your health care provider if you should take aspirin to prevent heart disease.  Have regular diabetes screenings by checking your fasting blood sugar level. ? If you are at a normal weight and have a low risk for diabetes, have this test once every three years after the age of 70. ? If you are overweight and have a high risk for diabetes, consider being tested at a younger age or more often.  A one-time screening for abdominal aortic aneurysm (AAA) by ultrasound is recommended for men aged 65-75 years who are current or former smokers. What should I know about preventing infection? Hepatitis B If you have a higher risk for hepatitis B, you should be screened for this virus. Talk with your health care provider to find out if you are at risk for hepatitis B infection. Hepatitis C Blood testing is recommended for:  Everyone born from [birth year] through 1964.  Anyone with known risk factors for hepatitis C.  Sexually Transmitted Diseases (STDs)  You should be screened each year for STDs including gonorrhea and chlamydia if: ? You are sexually active and are younger than 70 years of age. ? You are older than 70 years of age and your health care provider tells you that you are at risk for this type of infection. ? Your sexual activity has changed since you were last screened and you are at an increased risk for chlamydia or gonorrhea. Ask your health care provider if you are at risk.  Talk with your health care provider about whether you are at high risk of being infected with HIV. Your health care provider may recommend a prescription  medicine to help prevent HIV infection.  What else can I do?  Schedule regular health, dental, and eye exams.  Stay current with your vaccines (immunizations).  Do not use any tobacco products, such as cigarettes, chewing tobacco, and e-cigarettes. If you need help quitting, ask your health care provider.  Limit alcohol intake to no more than 2 drinks per day. One drink equals 12 ounces of beer, 5 ounces of wine, or 1 ounces of hard liquor.  Do not use street drugs.  Do not share needles.  Ask your health care provider for help if you need support or information about quitting drugs.  Tell your health care provider if you often feel depressed.  Tell your health care provider if you have ever been abused or do not feel safe at home. This information is not intended to replace advice given to you by your health care provider. Make sure you discuss any questions you  have with your health care provider. Document Released: 03/13/2008 Document Revised: 05/14/2016 Document Reviewed: 06/19/2015 Elsevier Interactive Patient Education  [employer].

## 2017-07-23 NOTE — Assessment & Plan Note (Signed)
Chronic problem.  On Fenofibrate and Fish oil daily.  Exercising regularly.  Asymptomatic.  Check labs.  Adjust meds prn

## 2017-07-24 ENCOUNTER — Other Ambulatory Visit: Payer: Self-pay | Admitting: *Deleted

## 2017-07-24 DIAGNOSIS — E875 Hyperkalemia: Secondary | ICD-10-CM

## 2017-07-28 ENCOUNTER — Other Ambulatory Visit (INDEPENDENT_AMBULATORY_CARE_PROVIDER_SITE_OTHER): Payer: Medicare Other

## 2017-07-28 DIAGNOSIS — E875 Hyperkalemia: Secondary | ICD-10-CM

## 2017-07-28 LAB — BASIC METABOLIC PANEL
BUN: 20 mg/dL (ref 6–23)
CO2: 31 mEq/L (ref 19–32)
Calcium: 9.9 mg/dL (ref 8.4–10.5)
Chloride: 103 mEq/L (ref 96–112)
Creatinine, Ser: 1.4 mg/dL (ref 0.40–1.50)
GFR: 53.27 mL/min — ABNORMAL LOW (ref >60.00)
Glucose, Bld: 98 mg/dL (ref 70–99)
Potassium: 4.3 mEq/L (ref 3.5–5.1)
Sodium: 141 mEq/L (ref 135–145)

## 2017-07-29 ENCOUNTER — Encounter: Payer: Self-pay | Admitting: General Practice

## 2017-08-18 ENCOUNTER — Encounter: Payer: Self-pay | Admitting: Family Medicine

## 2017-08-18 ENCOUNTER — Ambulatory Visit (INDEPENDENT_AMBULATORY_CARE_PROVIDER_SITE_OTHER): Payer: Medicare Other | Admitting: Family Medicine

## 2017-08-18 VITALS — BP 128/70 | HR 72 | Temp 98.2°F | Wt 181.4 lb

## 2017-08-18 DIAGNOSIS — M10071 Idiopathic gout, right ankle and foot: Secondary | ICD-10-CM

## 2017-08-18 DIAGNOSIS — M109 Gout, unspecified: Secondary | ICD-10-CM | POA: Insufficient documentation

## 2017-08-18 MED ORDER — CEPHALEXIN 500 MG PO CAPS: 500.0000 mg | ORAL_CAPSULE | Freq: Two times a day (BID) | ORAL | 0 refills | Status: DC

## 2017-08-18 MED ORDER — TRAMADOL HCL 50 MG PO TABS: 50.0000 mg | ORAL_TABLET | Freq: Three times a day (TID) | ORAL | 0 refills | Status: DC | PRN

## 2017-08-18 NOTE — Patient Instructions (Signed)
Take your ibuprofen (3 cap) every 8 hours or Naproxen (2 cap) every 12 hours as needed with food.  Take the tramadol for more severe pain unrelieved by the above.  Complete all of your antibiotics.   Once resolved, consider returning for follow blood work to assess your uric acid levels.    Gout Gout is painful swelling that can happen in some of your joints. Gout is a type of arthritis. This condition is caused by having too much uric acid in your body. Uric acid is a chemical that is made when your body breaks down substances called purines. If your body has too much uric acid, sharp crystals can form and build up in your joints. This causes pain and swelling. Gout attacks can happen quickly and be very painful (acute gout). Over time, the attacks can affect more joints and happen more often (chronic gout). Follow these instructions at home: During a Gout Attack  If directed, put ice on the painful area: ? Put ice in a plastic bag. ? Place a towel between your skin and the bag. ? Leave the ice on for 20 minutes, 2-3 times a day.  Rest the joint as much as possible. If the joint is in your leg, you may be given crutches to use.  Raise (elevate) the painful joint above the level of your heart as often as you can.  Drink enough fluids to keep your pee (urine) clear or pale yellow.  Take over-the-counter and prescription medicines only as told by your doctor.  Do not drive or use heavy machinery while taking prescription pain medicine.  Follow instructions from your doctor about what you can or cannot eat and drink.  Return to your normal activities as told by your doctor. Ask your doctor what activities are safe for you. Avoiding Future Gout Attacks  Follow a low-purine diet as told by a specialist (dietitian) or your doctor. Avoid foods and drinks that have a lot of purines, such  as: ? Liver. ? Kidney. ? Anchovies. ? Asparagus. ? Herring. ? Mushrooms ? Mussels. ? Beer.  Limit alcohol intake to no more than 1 drink a day for nonpregnant women and 2 drinks a day for men. One drink equals 12 oz of beer, 5 oz of wine, or 1 oz of hard liquor.  Stay at a healthy weight or lose weight if you are overweight. If you want to lose weight, talk with your doctor. It is important that you do not lose weight too fast.  Start or continue an exercise plan as told by your doctor.  Drink enough fluids to keep your pee clear or pale yellow.  Take over-the-counter and prescription medicines only as told by your doctor.  Keep all follow-up visits as told by your doctor. This is important. Contact a doctor if:  You have another gout attack.  You still have symptoms of a gout attack after10 days of treatment.  You have problems (side effects) because of your medicines.  You have chills or a fever.  You have burning pain when you pee (urinate).  You have pain in your lower back or belly. Get help right away if:  You have very bad pain.  Your pain cannot be controlled.  You cannot pee. This information is not intended to replace advice given to you by your health care provider. Make sure you discuss any questions you have with your health care provider. Document Released: 06/24/2008 Document Revised: 02/21/2016 Document Reviewed: 06/28/2015 Elsevier Interactive  Patient Education  2018 Elsevier Inc.  

## 2017-08-18 NOTE — Progress Notes (Signed)
Subjective   CC:  Chief Complaint  Patient presents with  . red swollen toe    right middle toe, pain/swelling started Sunday, redness started yesterday, ? if it's gout    HPI: [name] Short is a 71 y.o. male who presents to the office today to address the problems listed above in the chief complaint.  Pleasant 71 year old who presents with 2-day history of redness and pain in the middle of his third right toe.  Onset of pain was 2 days ago.  Since, pain has increased.  Now toe is moderately painful to light touch and walking.  He does have a history of gout diagnosed by arthrocentesis.  He is concerned that this is a gout flare although he has never had this presentation in this toe before.  He denies trauma to the toe, open sores, leg or ankle pain, myalgias, fevers or chills.  He has been using over-the-counter anti-inflammatories with mild relief of pain.  The pain has impaired his walking.  His most recent uric acid level was mildly elevated. Lab Results  Component Value Date   LABURIC 7.7 02/11/2016   He does take a full dose aspirin daily, has history of PAF.  He does not have renal dysfunction.  He denies history of peptic ulcer disease.  I reviewed the patients updated PMH, FH, and SocHx.    Patient Active Problem List   Diagnosis Date Noted  . Gout 08/18/2017  . Obese 01/23/2017  . Foot pain, left 02/11/2016  . Sebaceous cyst 07/14/2014  . Sun-damaged skin 07/14/2014  . 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/14/2014  . Acute bronchitis 12/17/2013  . 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 Physical exam, annual 05/17/2012  . HERNIA 08/12/2010  . 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 TOBACCO USER 05/01/2010  . Paroxysmal a-fib (HCC) 05/01/2010  . ABNORMAL ELECTROCARDIOGRAM 03/26/2010   Current Meds  Medication Sig  . aspirin 325 MG EC tablet Take 1 tablet (325 mg total) by mouth daily.  . Calcium Carbonate-Vitamin D (CALCIUM 600 + D PO) Take 1  tablet by mouth daily.  . fenofibrate 160 MG tablet TAKE 1 TABLET BY MOUTH  DAILY  . ibuprofen (ADVIL,MOTRIN) 200 MG tablet Take 2 tablets (400 mg total) by mouth 3 (three) times daily as needed for mild pain or moderate pain. For pain  . Multiple Vitamin (MULTIVITAMIN WITH MINERALS) TABS Take 1 tablet by mouth daily.  . naproxen sodium (ANAPROX) 220 MG tablet Take 2 tablets (440 mg total) by mouth 2 (two) times daily as needed (pain). For pain  . Omega 3 1000 MG CAPS Take by mouth.  . tamsulosin (FLOMAX) 0.4 MG CAPS capsule 0.4 mg daily.     Review of Systems: Constitutional: Negative for fever malaise or anorexia Cardiovascular: negative for chest pain Respiratory: negative for SOB or persistent cough Gastrointestinal: negative for abdominal pain  Objective  Vitals: BP 128/70 (BP Location: Left Arm, Patient Position: Sitting, Cuff Size: Normal)   Pulse 72   Temp 98.2 F (36.8 C) (Oral)   Wt 181 lb 6 oz (82.3 kg)   SpO2 96%   BMI 29.27 kg/m  General: no acute distress  Psych:  Alert and oriented, normal mood and affect Cardiovascular:  RRR without murmur or gallop. no peripheral edema, +2 distal pulses of the right foot Skin:  Warm,  Musculoskeletal: Space right third toe with redness warmth and tenderness over the interphalangeal joint.  Mild pain with  movement, interdigital webspaces are clear.  No open sores.  Normal nail bed.  First MCP is normal.  Normal ankle.  No calf tenderness.  Neurologic:   Mental status is normal.  Antalgic gait  Assessment  1. Acute idiopathic gout involving toe of right foot      Plan   This most likely represents an acute gout flare however it could also be an acute cellulitis.  We discussed both etiologies and prognosis.  I elected to cover with Keflex 500 twice daily for 1 week and continue anti-inflammatories.  We will add tramadol for more severe pain.  Recheck in 1 week if not resolved.  We did discuss the option of returning once resolved  for uric acid recheck.  I discussed the options of gout prevention medications if flares are becoming more frequent and he has an elevated uric acid count.  Patient was educated on side effects and risks of anti-inflammatory use.  Patient to take with food.  Follow up: Return if symptoms worsen or fail to improve.    Commons side effects, risks, benefits, and alternatives for medications and treatment plan prescribed today were discussed, and the patient expressed understanding of the given instructions. Patient is instructed to call or message via MyChart if he/she has any questions or concerns regarding our treatment plan. No barriers to understanding were identified. We discussed Red Flag symptoms and signs in detail. Patient expressed understanding regarding what to do in case of urgent or emergency type symptoms.   Medication list was reconciled, printed and provided to the patient in AVS. Patient instructions and summary information was reviewed with the patient as documented in the AVS. This note was prepared with assistance of Dragon voice recognition software. Occasional wrong-word or sound-a-like substitutions may have occurred due to the inherent limitations of voice recognition software  No orders of the defined types were placed in this encounter.  Meds ordered this encounter  Medications  . traMADol (ULTRAM) 50 MG tablet    Sig: Take 1 tablet (50 mg total) by mouth every 8 (eight) hours as needed for moderate pain.    Dispense:  30 tablet    Refill:  0  . cephALEXin (KEFLEX) 500 MG capsule    Sig: Take 1 capsule (500 mg total) by mouth 2 (two) times daily.    Dispense:  14 capsule    Refill:  0

## 2017-08-26 DIAGNOSIS — N401 Enlarged prostate with lower urinary tract symptoms: Secondary | ICD-10-CM | POA: Diagnosis not present

## 2017-08-26 DIAGNOSIS — N200 Calculus of kidney: Secondary | ICD-10-CM | POA: Diagnosis not present

## 2017-08-26 DIAGNOSIS — R3912 Poor urinary stream: Secondary | ICD-10-CM | POA: Diagnosis not present

## 2017-11-02 DIAGNOSIS — Z1211 Encounter for screening for malignant neoplasm of colon: Secondary | ICD-10-CM | POA: Diagnosis not present

## 2017-11-02 DIAGNOSIS — Z1212 Encounter for screening for malignant neoplasm of rectum: Secondary | ICD-10-CM | POA: Diagnosis not present

## 2017-11-03 LAB — COLOGUARD: Cologuard: NEGATIVE

## 2017-11-20 DIAGNOSIS — N200 Calculus of kidney: Secondary | ICD-10-CM | POA: Diagnosis not present

## 2017-11-23 ENCOUNTER — Encounter: Payer: Self-pay | Admitting: General Practice

## 2017-11-25 DIAGNOSIS — N200 Calculus of kidney: Secondary | ICD-10-CM | POA: Diagnosis not present

## 2017-11-25 DIAGNOSIS — N400 Enlarged prostate without lower urinary tract symptoms: Secondary | ICD-10-CM | POA: Diagnosis not present

## 2017-12-01 DIAGNOSIS — N200 Calculus of kidney: Secondary | ICD-10-CM | POA: Diagnosis not present

## 2018-01-12 ENCOUNTER — Ambulatory Visit (INDEPENDENT_AMBULATORY_CARE_PROVIDER_SITE_OTHER): Payer: Medicare Other | Admitting: Family Medicine

## 2018-01-12 ENCOUNTER — Other Ambulatory Visit: Payer: Self-pay

## 2018-01-12 ENCOUNTER — Encounter: Payer: Self-pay | Admitting: Family Medicine

## 2018-01-12 VITALS — BP 126/80 | HR 76 | Temp 98.0°F | Resp 16 | Ht 66.0 in | Wt 179.4 lb

## 2018-01-12 DIAGNOSIS — E783 Hyperchylomicronemia: Secondary | ICD-10-CM | POA: Diagnosis not present

## 2018-01-12 LAB — BASIC METABOLIC PANEL
BUN: 15 mg/dL (ref 6–23)
CO2: 31 mEq/L (ref 19–32)
Calcium: 9.7 mg/dL (ref 8.4–10.5)
Chloride: 104 mEq/L (ref 96–112)
Creatinine, Ser: 1.3 mg/dL (ref 0.40–1.50)
GFR: 57.95 mL/min — ABNORMAL LOW (ref >60.00)
Glucose, Bld: 101 mg/dL — ABNORMAL HIGH (ref 70–99)
Potassium: 5.1 mEq/L (ref 3.5–5.1)
Sodium: 141 mEq/L (ref 135–145)

## 2018-01-12 LAB — LIPID PANEL
Cholesterol: 166 mg/dL (ref 0–200)
HDL: 41.1 mg/dL (ref >39.00)
LDL Cholesterol: 110 mg/dL — ABNORMAL HIGH (ref 0–99)
NonHDL: 124.64
Total CHOL/HDL Ratio: 4
Triglycerides: 75 mg/dL (ref 0.0–149.0)
VLDL: 15 mg/dL (ref 0.0–40.0)

## 2018-01-12 LAB — HEPATIC FUNCTION PANEL
ALT: 21 U/L (ref 0–53)
AST: 21 U/L (ref 0–37)
Albumin: 4.5 g/dL (ref 3.5–5.2)
Alkaline Phosphatase: 51 U/L (ref 39–117)
Bilirubin, Direct: 0.1 mg/dL (ref 0.0–0.3)
Total Bilirubin: 0.6 mg/dL (ref 0.2–1.2)
Total Protein: 7.2 g/dL (ref 6.0–8.3)

## 2018-01-12 NOTE — Progress Notes (Signed)
   Subjective:    Patient ID: [name] Short, male    DOB: [birth year], 72 y.o.   MRN: [id number]  HPI Hyperlipidemia- chronic problem, pt is on Fenofibrate 160mg  and Omega 3 daily.  Pt has gained 5 lbs since last visit.  Continues to play tennis regularly- 2x/week.  No CP, SOB, HAs, visual changes, abd pain, N/V.   Review of Systems For ROS see HPI     Objective:   Physical Exam  Constitutional: He is oriented to person, place, and time. He appears well-developed and well-nourished. No distress.  HENT:  Head: Normocephalic and atraumatic.  Eyes: Pupils are equal, round, and reactive to light. Conjunctivae and EOM are normal.  Neck: Normal range of motion. Neck supple. No thyromegaly present.  Cardiovascular: Normal rate, regular rhythm, normal heart sounds and intact distal pulses.  No murmur heard. Pulmonary/Chest: Effort normal and breath sounds normal. No respiratory distress.  Abdominal: Soft. Bowel sounds are normal. He exhibits no distension.  Musculoskeletal: He exhibits no edema.  Lymphadenopathy:    He has no cervical adenopathy.  Neurological: He is alert and oriented to person, place, and time. No cranial nerve deficit.  Skin: Skin is warm and dry.  Psychiatric: He has a normal mood and affect. His behavior is normal.  Vitals reviewed.         Assessment & Plan:

## 2018-01-12 NOTE — Patient Instructions (Signed)
Follow up in 6 months to recheck cholesterol We'll notify you of your lab results and make any changes if needed Keep up the good work on healthy diet and regular exercise- you can do it! If your back pain continues and ortho doesn't have an explanation, let me know and we can place a referral Call with any questions or concerns Happy Easter!!

## 2018-01-12 NOTE — Assessment & Plan Note (Signed)
Chronic problem.  Tolerating Fenofibrate and Omega 3 w/o difficulty.  Encouraged healthy diet and regular exercise.  Check labs.  Adjust meds prn

## 2018-01-13 ENCOUNTER — Encounter: Payer: Self-pay | Admitting: General Practice

## 2018-03-22 ENCOUNTER — Other Ambulatory Visit: Payer: Self-pay | Admitting: Family Medicine

## 2018-05-05 DIAGNOSIS — N401 Enlarged prostate with lower urinary tract symptoms: Secondary | ICD-10-CM | POA: Diagnosis not present

## 2018-05-05 DIAGNOSIS — N200 Calculus of kidney: Secondary | ICD-10-CM | POA: Diagnosis not present

## 2018-05-05 DIAGNOSIS — R3912 Poor urinary stream: Secondary | ICD-10-CM | POA: Diagnosis not present

## 2018-05-05 DIAGNOSIS — N5201 Erectile dysfunction due to arterial insufficiency: Secondary | ICD-10-CM | POA: Diagnosis not present

## 2018-07-27 NOTE — Progress Notes (Addendum)
Subjective:   [name] Short is a 72 y.o. male who presents for Medicare Annual/Subsequent preventive examination.  Review of Systems:  No ROS.  Medicare Wellness Visit. Additional risk factors are reflected in the social history.  Cardiac Risk Factors include: advanced age (>73men, >16 women);dyslipidemia;male gender;smoking/ tobacco exposure;family history of premature cardiovascular disease   Sleep patterns: Sleeps 6-7 hours.  Home Safety/Smoke Alarms: Feels safe in home. Smoke alarms in place.  Living environment; residence and Firearm Safety: Lives with wife in 2 story home, lives on main level.  Seat Belt Safety/Bike Helmet: Wears seat belt.   Male:   CCS-Cologuard 11/02/2017, normal.     PSA-Followed by Urology  Lab Results  Component Value Date   PSA 1.60 07/23/2017   PSA 1.56 07/18/2016   PSA 1.59 07/17/2015       Objective:    Vitals: BP 128/70 (BP Location: Left Arm, Patient Position: Sitting, Cuff Size: Normal)   Pulse (!) 56   Ht 5\' 7"  (1.702 m)   Wt 181 lb 4 oz (82.2 kg)   SpO2 97%   BMI 28.39 kg/m   Body mass index is 28.39 kg/m.  Advanced Directives 07/28/2018 07/23/2017 06/23/2014 09/20/2013 08/29/2013 07/26/2013 07/12/2013  Does Patient Have a Medical Advance Directive? No No Yes Patient has advance directive, copy not in chart Patient has advance directive, copy not in chart Patient has advance directive, copy not in chart Patient has advance directive, copy not in chart  Type of Advance Directive - - Living will Living will Healthcare Power of [place];Living will Healthcare Power of [place];Living will Living will  Does patient want to make changes to medical advance directive? - - No - Patient declined - - - -  Copy of Healthcare Power of Attorney in Chart? - - No - copy requested - Copy requested from family Copy requested from family Copy requested from family  Would patient like information on creating a medical advance directive? Yes  (MAU/Ambulatory/Procedural Areas - Information given) Yes (MAU/Ambulatory/Procedural Areas - Information given) - - - - -  Pre-existing out of facility DNR order (yellow form or pink MOST form) - - - - No No No    Tobacco Social History   Tobacco Use  Smoking Status Light Tobacco Smoker  . Years: 10.00  . Types: Cigars  Smokeless Tobacco Never Used  Tobacco Comment   AVERAGE 3 CIGARS PER WEEK     Ready to quit: Not Answered Counseling given: Not Answered Comment: AVERAGE 3 CIGARS PER WEEK    Past Medical History:  Diagnosis Date  . Arthritis    WRIST  . At risk for sleep apnea    STOP-BANG=  4     SENT TO PCP 09-19-2013  . Complication of anesthesia    slow to wake  . History of concussion    AS CHILD--  NO RESIDUAL  . History of gout   . History of kidney stones    hasn't had any since the 1990's  . Hyperlipidemia   . Nephrolithiasis    BILATERAL  . PAF (paroxysmal atrial fibrillation) (HCC)    EPISODE --  2011  FOLLOW-UP W/ DR WALL  /  HAS NOT SEEN ANY CARDIOLOGIST SINCE   Past Surgical History:  Procedure Laterality Date  . APPENDECTOMY  1983  . CARDIOVASCULAR STRESS TEST  05-08-2010  DR WALL   NO EVIDENCE OF SCAR OR ISCHEMIA/ EF 54%/  PT HAD BOTH AFIB/ AFLUTTER DURING STUDY  . CYSTOSCOPY W/ URETERAL  STENT PLACEMENT Left 09/20/2013   Procedure: CYSTOSCOPY WITH STENT REPLACEMENT;  Surgeon: Antony Haste, MD;  Location: [facility];  Service: Urology;  Laterality: Left;  . CYSTOSCOPY WITH URETEROSCOPY Left 09/20/2013   Procedure: CYSTOSCOPY WITH URETEROSCOPY;  Surgeon: Antony Haste, MD;  Location: [facility];  Service: Urology;  Laterality: Left;  . CYSTOSCOPY WITH URETEROSCOPY AND STENT PLACEMENT Left 07/12/2013   Procedure: CYSTOSCOPY WITH LITHOLAPEXY, LEFT URETERAL STENT PLACEMENT;  Surgeon: Antony Haste, MD;  Location: [facility];  Service: Urology;  Laterality: Left;  . CYSTOSCOPY  WITH URETEROSCOPY AND STENT PLACEMENT Bilateral 06/23/2014   Procedure: BILATERAL URETEROSCOPY, HOLMIUM LASER LITHOTRIPSY AND STENT PLACEMENT, RIGHT ;  Surgeon: Jerilee Field, MD;  Location: [facility];  Service: Urology;  Laterality: Bilateral;  . EXTRACORPOREAL SHOCK WAVE LITHOTRIPSY  X2  . EXTRACORPOREAL SHOCK WAVE LITHOTRIPSY Left 08-29-2013;   07-28-2013  . HOLMIUM LASER APPLICATION Left 09/20/2013   Procedure: HOLMIUM LASER APPLICATION;  Surgeon: Antony Haste, MD;  Location: [facility];  Service: Urology;  Laterality: Left;  . LAPAROSCOPIC INGUINAL HERNIA REPAIR Bilateral 09-04-2010   w/ mesh  . TONSILLECTOMY  AS CHILD  . 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 TRANSTHORACIC ECHOCARDIOGRAM  05-08-2010   NORMAL LVSF/  EF 55%/  GRADE I DIASTOLIC DYSFUNCTION/  MILD BILATERAL ATRIUM ENLARGEMENT   Family History  Problem Relation Age of Onset  . Diabetes Mother   . Arthritis Mother   . Rheum arthritis Mother   . Dementia Mother   . Heart disease Father   . Prostate cancer Father   . Diabetes Sister   . Diabetes Maternal Grandmother   . Diabetes Maternal Grandfather   . Bipolar disorder Brother   . Diabetes Brother   . Dementia Brother   . Neuropathy Brother    Social History   Socioeconomic History  . Marital status: Married    Spouse name: Not on file  . Number of children: Not on file  . Years of education: Not on file  . Highest education level: Not on file  Occupational History  . Not on file  Social Needs  . Financial resource strain: Not on file  . Food insecurity:    Worry: Not on file    Inability: Not on file  . Transportation needs:    Medical: Not on file    Non-medical: Not on file  Tobacco  Use  . Smoking status: Light Tobacco Smoker    Years: 10.00    Types: Cigars  . Smokeless tobacco: Never Used  . Tobacco comment: AVERAGE 3 CIGARS PER WEEK  Substance and Sexual Activity  . Alcohol use: Yes    Alcohol/week: 2.0 standard drinks    Types: 2 Cans of beer per week    Comment: 2 beers a week  . Drug use: No  . Sexual activity: Not on file  Lifestyle  . Physical activity:    Days per week: Not on file    Minutes per session: Not on file  . Stress: Not on file  Relationships  . Social connections:    Talks on phone: Not on file    Gets together: Not on file    Attends religious service: Not on file  Active member of club or organization: Not on file    Attends meetings of clubs or organizations: Not on file    Relationship status: Not on file  Other Topics Concern  . Not on file  Social History Narrative  . Not on file    Outpatient Encounter Medications as of 07/28/2018  Medication Sig  . aspirin 325 MG EC tablet Take 1 tablet (325 mg total) by mouth daily.  . Calcium Carbonate-Vitamin D (CALCIUM 600 + D PO) Take 1 tablet by mouth daily.  . fenofibrate 160 MG tablet TAKE 1 TABLET BY MOUTH  DAILY  . ibuprofen (ADVIL,MOTRIN) 200 MG tablet Take 2 tablets (400 mg total) by mouth 3 (three) times daily as needed for mild pain or moderate pain. For pain  . Multiple Vitamin (MULTIVITAMIN WITH MINERALS) TABS Take 1 tablet by mouth daily.  . naproxen sodium (ANAPROX) 220 MG tablet Take 2 tablets (440 mg total) by mouth 2 (two) times daily as needed (pain). For pain  . Omega 3 1000 MG CAPS Take by mouth.  . tamsulosin (FLOMAX) 0.4 MG CAPS capsule 0.4 mg daily.   . traMADol (ULTRAM) 50 MG tablet Take 1 tablet (50 mg total) by mouth every 8 (eight) hours as needed for moderate pain. (Patient not taking: Reported on 01/12/2018)   No facility-administered encounter medications on file as of 07/28/2018.     Activities of Daily Living In your present state of health, do you  have any difficulty performing the following activities: 07/28/2018 01/12/2018  Hearing? N N  Vision? N N  Difficulty concentrating or making decisions? N N  Walking or climbing stairs? N N  Dressing or bathing? N N  Doing errands, shopping? N N  Preparing Food and eating ? N -  Using the Toilet? N -  In the past six months, have you accidently leaked urine? N -  Do you have problems with loss of bowel control? N -  Managing your Medications? N -  Managing your Finances? N -  Housekeeping or managing your Housekeeping? N -  Some recent data might be hidden    Patient Care Team: Sheliah Hatch, MD as PCP - General Jerilee Field, MD as Consulting Physician (Urology) Eldred Manges, MD as Consulting Physician (Orthopedic Surgery) Iva Boop, MD as Consulting Physician (Gastroenterology) Aris Lot, MD as Consulting Physician (Dermatology) Harriette Bouillon, MD as Consulting Physician (General Surgery) Abigail Miyamoto, MD as Consulting Physician (General Surgery)   Assessment:   This is a routine wellness examination for [name] Short.  Exercise Activities and Dietary recommendations Current Exercise Habits: Home exercise routine(tennis; yard work), Type of exercise: Other - see comments(dancing; tennis), Frequency (Times/Week): 2, Exercise limited by: None identified   Diet (meal preparation, eat out, water intake, caffeinated beverages, dairy products, fruits and vegetables): Drinks Coke and little water.   Breakfast: bagel; occasional sausage/egg biscuit; sweet roll; coffee Lunch: skips; hamburger Dinner: protein and vegetables; salad  Goals      Patient Stated   . patient (pt-stated)     Maintain current health by staying active.       Other   . Weight (lb) < 175 lb (79.4 kg)     Lose weight by decreasing portion size.        Fall Risk Fall Risk  07/28/2018 01/12/2018 07/23/2017 07/23/2017 07/18/2016  Falls in the past year? No No No No No     Depression Screen PHQ 2/9 Scores 07/28/2018 01/12/2018 07/23/2017 07/23/2017  PHQ -  2 Score 0 0 0 0  PHQ- 9 Score - 0 - 0    Cognitive Function MMSE - Mini Mental State Exam 07/28/2018  Orientation to time 5  Orientation to Place 5  Registration 3  Attention/ Calculation 5  Recall 3  Language- name 2 objects 2  Language- repeat 1  Language- follow 3 step command 3  Language- read & follow direction 1  Write a sentence 1  Copy design 1  Total score 30        Immunization History  Administered Date(s) Administered  . Influenza Split 08/24/2012  . Influenza Whole 08/26/2010  . Influenza, High Dose Seasonal PF 07/07/2014, 07/23/2017, 07/28/2018  . Influenza,inj,Quad PF,6+ Mos 06/08/2013, 07/17/2015, 07/18/2016  . Pneumococcal Conjugate-13 07/14/2014  . Pneumococcal Polysaccharide-23 06/08/2013  . Tdap 09/16/2016  . Typhoid Live 06/11/2017  . Zoster 05/17/2012   Declines Shingrix  Screening Tests Health Maintenance  Topic Date Due  . INFLUENZA VACCINE  04/29/2018  . Fecal DNA (Cologuard)  11/02/2020  . TETANUS/TDAP  09/16/2026  . Hepatitis C Screening  Completed  . PNA vac Low Risk Adult  Completed        Plan:    Schedule orthopedic appointment.   Schedule eye exam.   Bring a copy of your living will and/or healthcare power of attorney to your next office visit.  Continue doing brain stimulating activities (puzzles, reading, adult coloring books, staying active) to keep memory sharp.    I have personally reviewed and noted the following in the patient's chart:   . Medical and social history . Use of alcohol, tobacco or illicit drugs  . Current medications and supplements . Functional ability and status . Nutritional status . Physical activity . Advanced directives . List of other physicians . Hospitalizations, surgeries, and ER visits in previous 12 months . Vitals . Screenings to include cognitive, depression, and falls . Referrals and  appointments  In addition, I have reviewed and discussed with patient certain preventive protocols, quality metrics, and best practice recommendations. A written personalized care plan for preventive services as well as general preventive health recommendations were provided to patient.     Alysia Penna, RN  07/28/2018  PCP Notes: -Pt c/o hip/back pain, plans to sched f/u with ortho.  -F/U with PCP 07/2018  Reviewed documentation provided by RN and agree w/ above.  Neena Rhymes, MD

## 2018-07-28 ENCOUNTER — Ambulatory Visit (INDEPENDENT_AMBULATORY_CARE_PROVIDER_SITE_OTHER): Payer: Medicare Other

## 2018-07-28 ENCOUNTER — Other Ambulatory Visit: Payer: Self-pay

## 2018-07-28 DIAGNOSIS — Z0000 Encounter for general adult medical examination without abnormal findings: Secondary | ICD-10-CM

## 2018-07-28 DIAGNOSIS — Z23 Encounter for immunization: Secondary | ICD-10-CM | POA: Diagnosis not present

## 2018-07-28 NOTE — Patient Instructions (Addendum)
Schedule orthopedic appointment.   Schedule eye exam.   Bring a copy of your living will and/or healthcare power of attorney to your next office visit.  Continue doing brain stimulating activities (puzzles, reading, adult coloring books, staying active) to keep memory sharp.  Health Maintenance, Male A healthy lifestyle and preventive care is important for your health and wellness. Ask your health care provider about what schedule of regular examinations is right for you. What should I know about weight and diet? Eat a Healthy Diet  Eat plenty of vegetables, fruits, whole grains, low-fat dairy products, and lean protein.  Do not eat a lot of foods high in solid fats, added sugars, or salt.  Maintain a Healthy Weight Regular exercise can help you achieve or maintain a healthy weight. You should:  Do at least 150 minutes of exercise each week. The exercise should increase your heart rate and make you sweat (moderate-intensity exercise).  Do strength-training exercises at least twice a week.  Watch Your Levels of Cholesterol and Blood Lipids  Have your blood tested for lipids and cholesterol every 5 years starting at 71 years of age. If you are at high risk for heart disease, you should start having your blood tested when you are 71 years old. You may need to have your cholesterol levels checked more often if: ? Your lipid or cholesterol levels are high. ? You are older than 71 years of age. ? You are at high risk for heart disease.  What should I know about cancer screening? Many types of cancers can be detected early and may often be prevented. Lung Cancer  You should be screened every year for lung cancer if: ? You are a current smoker who has smoked for at least 30 years. ? You are a former smoker who has quit within the past 15 years.  Talk to your health care provider about your screening options, when you should start screening, and how often you should be  screened.  Colorectal Cancer  Routine colorectal cancer screening usually begins at 71 years of age and should be repeated every 5-10 years until you are 71 years old. You may need to be screened more often if early forms of precancerous polyps or small growths are found. Your health care provider may recommend screening at an earlier age if you have risk factors for colon cancer.  Your health care provider may recommend using home test kits to check for hidden blood in the stool.  A small camera at the end of a tube can be used to examine your colon (sigmoidoscopy or colonoscopy). This checks for the earliest forms of colorectal cancer.  Prostate and Testicular Cancer  Depending on your age and overall health, your health care provider may do certain tests to screen for prostate and testicular cancer.  Talk to your health care provider about any symptoms or concerns you have about testicular or prostate cancer.  Skin Cancer  Check your skin from head to toe regularly.  Tell your health care provider about any new moles or changes in moles, especially if: ? There is a change in a mole's size, shape, or color. ? You have a mole that is larger than a pencil eraser.  Always use sunscreen. Apply sunscreen liberally and repeat throughout the day.  Protect yourself by wearing long sleeves, pants, a wide-brimmed hat, and sunglasses when outside.  What should I know about heart disease, diabetes, and high blood pressure?  If you are 18-39 years  of age, have your blood pressure checked every 3-5 years. If you are 22 years of age or older, have your blood pressure checked every year. You should have your blood pressure measured twice-once when you are at a hospital or clinic, and once when you are not at a hospital or clinic. Record the average of the two measurements. To check your blood pressure when you are not at a hospital or clinic, you can use: ? An automated blood pressure machine at a  pharmacy. ? A home blood pressure monitor.  Talk to your health care provider about your target blood pressure.  If you are between 4-29 years old, ask your health care provider if you should take aspirin to prevent heart disease.  Have regular diabetes screenings by checking your fasting blood sugar level. ? If you are at a normal weight and have a low risk for diabetes, have this test once every three years after the age of 15. ? If you are overweight and have a high risk for diabetes, consider being tested at a younger age or more often.  A one-time screening for abdominal aortic aneurysm (AAA) by ultrasound is recommended for men aged 83-75 years who are current or former smokers. What should I know about preventing infection? Hepatitis B If you have a higher risk for hepatitis B, you should be screened for this virus. Talk with your health care provider to find out if you are at risk for hepatitis B infection. Hepatitis C Blood testing is recommended for:  Everyone born from [birth year] through 1964.  Anyone with known risk factors for hepatitis C.  Sexually Transmitted Diseases (STDs)  You should be screened each year for STDs including gonorrhea and chlamydia if: ? You are sexually active and are younger than 71 years of age. ? You are older than 71 years of age and your health care provider tells you that you are at risk for this type of infection. ? Your sexual activity has changed since you were last screened and you are at an increased risk for chlamydia or gonorrhea. Ask your health care provider if you are at risk.  Talk with your health care provider about whether you are at high risk of being infected with HIV. Your health care provider may recommend a prescription medicine to help prevent HIV infection.  What else can I do?  Schedule regular health, dental, and eye exams.  Stay current with your vaccines (immunizations).  Do not use any tobacco products, such as  cigarettes, chewing tobacco, and e-cigarettes. If you need help quitting, ask your health care provider.  Limit alcohol intake to no more than 2 drinks per day. One drink equals 12 ounces of beer, 5 ounces of wine, or 1 ounces of hard liquor.  Do not use street drugs.  Do not share needles.  Ask your health care provider for help if you need support or information about quitting drugs.  Tell your health care provider if you often feel depressed.  Tell your health care provider if you have ever been abused or do not feel safe at home. This information is not intended to replace advice given to you by your health care provider. Make sure you discuss any questions you have with your health care provider. Document Released: 03/13/2008 Document Revised: 05/14/2016 Document Reviewed: 06/19/2015 Elsevier Interactive Patient Education  [employer].

## 2018-08-05 ENCOUNTER — Encounter: Payer: Self-pay | Admitting: Family Medicine

## 2018-08-05 ENCOUNTER — Other Ambulatory Visit: Payer: Self-pay

## 2018-08-05 ENCOUNTER — Ambulatory Visit (INDEPENDENT_AMBULATORY_CARE_PROVIDER_SITE_OTHER): Payer: Medicare Other | Admitting: Family Medicine

## 2018-08-05 VITALS — BP 121/72 | HR 65 | Temp 98.2°F | Resp 16 | Ht 67.0 in | Wt 178.1 lb

## 2018-08-05 DIAGNOSIS — E783 Hyperchylomicronemia: Secondary | ICD-10-CM

## 2018-08-05 DIAGNOSIS — Z125 Encounter for screening for malignant neoplasm of prostate: Secondary | ICD-10-CM

## 2018-08-05 DIAGNOSIS — I480 Paroxysmal atrial fibrillation: Secondary | ICD-10-CM | POA: Diagnosis not present

## 2018-08-05 LAB — HEPATIC FUNCTION PANEL
ALT: 18 U/L (ref 0–53)
AST: 20 U/L (ref 0–37)
Albumin: 4.6 g/dL (ref 3.5–5.2)
Alkaline Phosphatase: 48 U/L (ref 39–117)
Bilirubin, Direct: 0.1 mg/dL (ref 0.0–0.3)
Total Bilirubin: 0.5 mg/dL (ref 0.2–1.2)
Total Protein: 7.2 g/dL (ref 6.0–8.3)

## 2018-08-05 LAB — CBC WITH DIFFERENTIAL/PLATELET
Basophils Absolute: 0 10*3/uL (ref 0.0–0.1)
Basophils Relative: 0.6 % (ref 0.0–3.0)
Eosinophils Absolute: 0.1 10*3/uL (ref 0.0–0.7)
Eosinophils Relative: 2 % (ref 0.0–5.0)
HCT: 44.7 % (ref 39.0–52.0)
Hemoglobin: 14.8 g/dL (ref 13.0–17.0)
Lymphocytes Relative: 26.6 % (ref 12.0–46.0)
Lymphs Abs: 1.9 10*3/uL (ref 0.7–4.0)
MCHC: 33.1 g/dL (ref 30.0–36.0)
MCV: 90.4 fl (ref 78.0–100.0)
Monocytes Absolute: 0.6 10*3/uL (ref 0.1–1.0)
Monocytes Relative: 8.8 % (ref 3.0–12.0)
Neutro Abs: 4.3 10*3/uL (ref 1.4–7.7)
Neutrophils Relative %: 62 % (ref 43.0–77.0)
Platelets: 234 10*3/uL (ref 150.0–400.0)
RBC: 4.94 Mil/uL (ref 4.22–5.81)
RDW: 13.4 % (ref 11.5–15.5)
WBC: 7 10*3/uL (ref 4.0–10.5)

## 2018-08-05 LAB — BASIC METABOLIC PANEL
BUN: 18 mg/dL (ref 6–23)
CO2: 31 mEq/L (ref 19–32)
Calcium: 10.1 mg/dL (ref 8.4–10.5)
Chloride: 104 mEq/L (ref 96–112)
Creatinine, Ser: 1.34 mg/dL (ref 0.40–1.50)
GFR: 55.87 mL/min — ABNORMAL LOW (ref >60.00)
Glucose, Bld: 97 mg/dL (ref 70–99)
Potassium: 5.4 mEq/L — ABNORMAL HIGH (ref 3.5–5.1)
Sodium: 142 mEq/L (ref 135–145)

## 2018-08-05 LAB — TSH: TSH: 3.04 u[IU]/mL (ref 0.35–4.50)

## 2018-08-05 LAB — LIPID PANEL
Cholesterol: 184 mg/dL (ref 0–200)
HDL: 40.8 mg/dL (ref >39.00)
LDL Cholesterol: 116 mg/dL — ABNORMAL HIGH (ref 0–99)
NonHDL: 142.9
Total CHOL/HDL Ratio: 5
Triglycerides: 134 mg/dL (ref 0.0–149.0)
VLDL: 26.8 mg/dL (ref 0.0–40.0)

## 2018-08-05 LAB — PSA, MEDICARE: PSA: 1.62 ng/ml (ref 0.10–4.00)

## 2018-08-05 NOTE — Assessment & Plan Note (Signed)
Pt has hx of lone Afib.  On ASA 325mg  daily for anticoagulation.  No need for rate control.

## 2018-08-05 NOTE — Progress Notes (Signed)
   Subjective:    Patient ID: [name] Short, male    DOB: 25-Aug-1946, 72 y.o.   MRN: [id number]  HPI Hyperlipidemia- chronic problem, on Fenofibrate daily.  Playing tennis regularly.  No CP, SOB, palpitations, HAs, abd pain, N/V.  Paroxysmal Afib- pt has hx of lone Afib.  On ASA 325mg  for anticoagulation.  HR 65.  Denies palpitations, SOB.   Review of Systems For ROS see HPI     Objective:   Physical Exam  Constitutional: He is oriented to person, place, and time. He appears well-developed and well-nourished. No distress.  HENT:  Head: Normocephalic and atraumatic.  Eyes: Pupils are equal, round, and reactive to light. Conjunctivae and EOM are normal.  Neck: Normal range of motion. Neck supple. No thyromegaly present.  Cardiovascular: Normal rate, regular rhythm, normal heart sounds and intact distal pulses.  No murmur heard. Pulmonary/Chest: Effort normal and breath sounds normal. No respiratory distress.  Abdominal: Soft. Bowel sounds are normal. He exhibits no distension.  Musculoskeletal: He exhibits no edema.  Lymphadenopathy:    He has no cervical adenopathy.  Neurological: He is alert and oriented to person, place, and time. No cranial nerve deficit.  Skin: Skin is warm and dry.  Psychiatric: He has a normal mood and affect. His behavior is normal.  Vitals reviewed.         Assessment & Plan:

## 2018-08-05 NOTE — Assessment & Plan Note (Signed)
Chronic problem.  On Fenofibrate daily.  Exercising regularly.  Last LDL 110.  Check labs and adjust tx plan prn.

## 2018-08-05 NOTE — Patient Instructions (Signed)
Follow up in 1 year or as needed We'll notify you of your lab results and make any changes if needed Keep up the good work on healthy diet and regular exercise- you look great! Call with any questions or concerns Happy Holidays!!! 

## 2018-08-06 ENCOUNTER — Encounter: Payer: Self-pay | Admitting: General Practice

## 2018-08-11 ENCOUNTER — Other Ambulatory Visit: Payer: Self-pay | Admitting: Family Medicine

## 2018-08-16 ENCOUNTER — Telehealth: Payer: Self-pay | Admitting: *Deleted

## 2018-08-16 NOTE — Telephone Encounter (Signed)
Patient returned call about lab results. Notified of results  Labs look great! No changes at this time

## 2018-10-22 ENCOUNTER — Ambulatory Visit (INDEPENDENT_AMBULATORY_CARE_PROVIDER_SITE_OTHER): Payer: Self-pay

## 2018-10-22 ENCOUNTER — Ambulatory Visit (INDEPENDENT_AMBULATORY_CARE_PROVIDER_SITE_OTHER): Payer: Medicare Other

## 2018-10-22 ENCOUNTER — Encounter (INDEPENDENT_AMBULATORY_CARE_PROVIDER_SITE_OTHER): Payer: Self-pay | Admitting: Orthopaedic Surgery

## 2018-10-22 ENCOUNTER — Ambulatory Visit (INDEPENDENT_AMBULATORY_CARE_PROVIDER_SITE_OTHER): Payer: Medicare Other | Admitting: Orthopaedic Surgery

## 2018-10-22 VITALS — BP 144/81 | HR 81 | Ht 66.0 in | Wt 175.0 lb

## 2018-10-22 DIAGNOSIS — Z96643 Presence of artificial hip joint, bilateral: Secondary | ICD-10-CM | POA: Diagnosis not present

## 2018-10-22 DIAGNOSIS — M25552 Pain in left hip: Secondary | ICD-10-CM

## 2018-10-22 DIAGNOSIS — M25551 Pain in right hip: Secondary | ICD-10-CM

## 2018-10-24 DIAGNOSIS — Z96643 Presence of artificial hip joint, bilateral: Secondary | ICD-10-CM | POA: Insufficient documentation

## 2018-10-24 NOTE — Progress Notes (Signed)
Office Visit Note   Patient: [name] Short           Date of Birth: [birth year]           MRN: [id number] Visit Date: 10/22/2018              Requested by: Sheliah Hatchabori, Katherine E, MD [address] [place], [place] [id number] PCP: Sheliah Hatchabori, Katherine E, MD   Assessment & Plan: Visit Diagnoses:  1. Pain in left hip   2. Pain of both hip joints     Plan: Patient has no evidence of loosening or eccentric wear on his standing weightbearing hip x-rays.  He can return in 5 years or sooner if he starts having symptoms which we have discussed in detail signs and symptoms of wear.  Follow-Up Instructions: No follow-ups on file.   Orders:  Orders Placed This Encounter  Procedures  . XR HIP UNILAT W OR W/O PELVIS 2-3 VIEWS RIGHT  . XR HIPS BILAT W OR W/O PELVIS 2V   No orders of the defined types were placed in this encounter.     Procedures: No procedures performed   Clinical Data: No additional findings.   Subjective: Chief Complaint  Patient presents with  . Right Hip - Pain  . Left Hip - Pain    HPI 73 year old male returns post right anterior total hip arthroplasty ceramic on poly-2014 Depuy  and left posterior total hip arthroplasty 2003 [employer] ceramic on ceramic.  Both hips are doing well.  He plays tennis at least twice a week.  Sometimes he is occasionally had some aching in the groin closer to the gracilis.  He has some stiffness in his knees back and hips when he first gets up and does better once he is up walking.  He is occasionally used Advil or Aleve.  Denies limping.  Past history of atrial fib.  Review of Systems's for increased cholesterol history of atrial fib, bilateral total hip arthroplasties, hypertension.   Objective: Vital Signs: BP (!) 144/81   Pulse 81   Ht 5\' 6"  (1.676 m)   Wt 175 lb (79.4 kg)   BMI 28.25 kg/m   Physical Exam Constitutional:      Appearance: He is well-developed.  HENT:     Head: Normocephalic and atraumatic.    Eyes:     Pupils: Pupils are equal, round, and reactive to light.  Neck:     Thyroid: No thyromegaly.     Trachea: No tracheal deviation.  Cardiovascular:     Rate and Rhythm: Normal rate.  Pulmonary:     Effort: Pulmonary effort is normal.     Breath sounds: No wheezing.  Abdominal:     General: Bowel sounds are normal.     Palpations: Abdomen is soft.  Skin:    General: Skin is warm and dry.     Capillary Refill: Capillary refill takes less than 2 seconds.  Neurological:     Mental Status: He is alert and oriented to person, place, and time.  Psychiatric:        Behavior: Behavior normal.        Thought Content: Thought content normal.        Judgment: Judgment normal.     Ortho Exam leg lengths are equal he can get from sitting to standing easily walk back and forth across exam room walk on his heels and toes with no problems.  Good lower extremity strength.  Pitting edema.  Palpable  distal pulses.  Specialty Comments:  No specialty comments available.  Imaging: No results found.   PMFS History: Patient Active Problem List   Diagnosis Date Noted  . Gout 08/18/2017  . Obese 01/23/2017  . Foot pain, left 02/11/2016  . Sebaceous cyst 07/14/2014  . Sun-damaged skin 07/14/2014  . 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/14/2014  . Acute bronchitis 12/17/2013  . 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 Physical exam, annual 05/17/2012  . HERNIA 08/12/2010  . 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 TOBACCO USER 05/01/2010  . Paroxysmal a-fib (HCC) 05/01/2010  . ABNORMAL ELECTROCARDIOGRAM 03/26/2010   Past Medical History:  Diagnosis Date  . Arthritis    WRIST  . At risk for sleep apnea    STOP-BANG=  4     SENT TO PCP 09-19-2013  . Complication of anesthesia    slow to wake  . History of concussion    AS CHILD--  NO RESIDUAL  . History of gout   . History of kidney stones    hasn't had any since the 1990's  . Hyperlipidemia   .  Nephrolithiasis    BILATERAL  . PAF (paroxysmal atrial fibrillation) (HCC)    EPISODE --  2011  FOLLOW-UP W/ DR WALL  /  HAS NOT SEEN ANY CARDIOLOGIST SINCE    Family History  Problem Relation Age of Onset  . Diabetes Mother   . Arthritis Mother   . Rheum arthritis Mother   . Dementia Mother   . Heart disease Father   . Prostate cancer Father   . Diabetes Sister   . Diabetes Maternal Grandmother   . Diabetes Maternal Grandfather   . Bipolar disorder Brother   . Diabetes Brother   . Dementia Brother   . Neuropathy Brother     Past Surgical History:  Procedure Laterality Date  . APPENDECTOMY  1983  . CARDIOVASCULAR STRESS TEST  05-08-2010  DR WALL   NO EVIDENCE OF SCAR OR ISCHEMIA/ EF 54%/  PT HAD BOTH AFIB/ AFLUTTER DURING STUDY  . CYSTOSCOPY W/ URETERAL STENT PLACEMENT Left 09/20/2013   Procedure: CYSTOSCOPY WITH STENT REPLACEMENT;  Surgeon: Antony Haste, MD;  Location: [facility];  Service: Urology;  Laterality: Left;  . CYSTOSCOPY WITH URETEROSCOPY Left 09/20/2013   Procedure: CYSTOSCOPY WITH URETEROSCOPY;  Surgeon: Antony Haste, MD;  Location: [facility];  Service: Urology;  Laterality: Left;  . CYSTOSCOPY WITH URETEROSCOPY AND STENT PLACEMENT Left 07/12/2013   Procedure: CYSTOSCOPY WITH LITHOLAPEXY, LEFT URETERAL STENT PLACEMENT;  Surgeon: Antony Haste, MD;  Location: [facility];  Service: Urology;  Laterality: Left;  . CYSTOSCOPY WITH URETEROSCOPY AND STENT PLACEMENT Bilateral 06/23/2014   Procedure: BILATERAL URETEROSCOPY, HOLMIUM LASER LITHOTRIPSY AND STENT PLACEMENT, RIGHT ;  Surgeon: Jerilee Field, MD;  Location: [facility];  Service: Urology;  Laterality: Bilateral;  . EXTRACORPOREAL SHOCK WAVE LITHOTRIPSY  X2  . EXTRACORPOREAL SHOCK WAVE LITHOTRIPSY Left 08-29-2013;   07-28-2013  . HOLMIUM LASER APPLICATION Left 09/20/2013   Procedure: HOLMIUM LASER APPLICATION;  Surgeon:  Antony Haste, MD;  Location: [facility];  Service: Urology;  Laterality: Left;  . LAPAROSCOPIC INGUINAL HERNIA REPAIR Bilateral 09-04-2010   w/ mesh  . TONSILLECTOMY  AS CHILD  . 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 TOTAL HIP ARTHROPLASTY Right 11/15/2012   Procedure: RIGHT TOTAL HIP ARTHROPLASTY ANTERIOR APPROACH;  Surgeon: Eldred Manges, MD;  Location: [facility]  OR;  Service: Orthopedics;  Laterality: Right;  Right total hip arthroplasty  . TRANSTHORACIC ECHOCARDIOGRAM  05-08-2010   NORMAL LVSF/  EF 55%/  GRADE I DIASTOLIC DYSFUNCTION/  MILD BILATERAL ATRIUM ENLARGEMENT   Social History   Occupational History  . Not on file  Tobacco Use  . Smoking status: Light Tobacco Smoker    Years: 10.00    Types: Cigars  . Smokeless tobacco: Never Used  . Tobacco comment: AVERAGE 3 CIGARS PER WEEK  Substance and Sexual Activity  . Alcohol use: Yes    Alcohol/week: 2.0 standard drinks    Types: 2 Cans of beer per week    Comment: 2 beers a week  . Drug use: No  . Sexual activity: Not on file

## 2018-11-09 DIAGNOSIS — N200 Calculus of kidney: Secondary | ICD-10-CM | POA: Diagnosis not present

## 2018-11-09 DIAGNOSIS — R3912 Poor urinary stream: Secondary | ICD-10-CM | POA: Diagnosis not present

## 2018-11-09 DIAGNOSIS — N401 Enlarged prostate with lower urinary tract symptoms: Secondary | ICD-10-CM | POA: Diagnosis not present

## 2018-11-11 DIAGNOSIS — N200 Calculus of kidney: Secondary | ICD-10-CM | POA: Diagnosis not present

## 2018-11-22 ENCOUNTER — Other Ambulatory Visit: Payer: Self-pay | Admitting: Urology

## 2018-11-23 DIAGNOSIS — M19031 Primary osteoarthritis, right wrist: Secondary | ICD-10-CM | POA: Diagnosis not present

## 2018-11-23 DIAGNOSIS — M79641 Pain in right hand: Secondary | ICD-10-CM | POA: Diagnosis not present

## 2018-11-23 DIAGNOSIS — G5601 Carpal tunnel syndrome, right upper limb: Secondary | ICD-10-CM | POA: Diagnosis not present

## 2018-12-08 NOTE — H&P (Signed)
Office Visit Report     11/11/2018   --------------------------------------------------------------------------------   Gerlene Burdock I. Blocher  MRN: [id number]  PRIMARY CARE:  Helane Rima. Beverely Low, MD  DOB: 09/13/1946, 73 year old Male  REFERRING:  Doyne Keel, MD  SSN: -[id number] [id number]  PROVIDER:  Jerilee Field, M.D.    TREATING:  Wilkie Aye, M.D.    LOCATION:  [facility] [phone]   --------------------------------------------------------------------------------   CC: I have kidney stones.  HPI: Anthany Polit is a 73 year-old male established patient who is here for renal calculi.  The problem is on both sides. He first stated noticing pain on approximately 01/27/2017. This is not his first kidney stone. He is not currently having flank pain, back pain, groin pain, nausea, vomiting, fever or chills.   He has had eswl and ureteroscopy for treatment of his stones in the past.   Underwent staged left ESWL/URS 2014 - 2015 for large left stone. Right ureteroscopy in 2015.   KUB Nov 2018 reveals significant progression of stones especially on the left. He had left flank pain. CT November 20, 2017: Right kidney-8 mm right lower pole, 5 mm right lower pole; left kidney-9 mm left midpole, 9 mm left lower pole, 15 mm left lower pole, 9 mm left lower pole, 6.5 mm left UPJ stone with mild hydronephrosis. Hounsfield units up to 1200.   He underwent left URS/HLL/stent Mar 2019. KUB today with stable Right kidney-8 mm right lower pole, 5 mm right lower pole . Small 3 mm LLP stone. Much improved on left. Renal US with resolution of left hydro. He passed a few fragments after removing the stent not recent flank pain. No gross hematuria.   KUB today with 11 mm Right UPJ stone and 6 mm LMP stone. He has had some pain in right flank and RLQ. UA with 20-40 rbc.   11/11/2018: He presented today for renal US and was found to have mild left hydronephrosis. He underwent CT whoch showed  bilateral renal calculi and left extrarenal pelvis.     ALLERGIES: Coumadin TABS    MEDICATIONS: Aspirin 325 MG Oral Tablet Oral  Calcium + D TABS Oral  Fenofibrate 160 mg tablet Oral  Fish Oil CAPS Oral  Ibuprofen TABS Oral  Multi-Vitamin TABS Oral  Naproxen Sodium TABS Oral  Sildenafil Citrate 20 mg tablet take 1-5 tablets po prn as directed.     GU PSH: Cysto Uretero Lithotripsy - 2015, 2015 Cystoscopy - 03/30/2017 Cystoscopy Insert Stent - 2015, 2015, 2014 ESWL - 2014, 2014, 2014 Ureteroscopic laser litho, Left - 12/01/2017      PSH Notes: Cystoscopy With Ureteroscopy With Lithotripsy, Cystoscopy With Insertion Of Ureteral Stent Right, Cystoscopy With Ureteroscopy With Lithotripsy, Cystoscopy With Insertion Of Ureteral Stent Left, Lithotripsy, Lithotripsy, Cystoscopy With Insertion Of Ureteral Stent Left, Tonsillectomy, Hip Surgery, Appendectomy, Hernia Repair, Lithotripsy, Hip Surgery   NON-GU PSH: Appendectomy - 2014 Hernia Repair - 2014 Remove Tonsils - 2014    GU PMH: BPH w/LUTS - 11/09/2018, - 05/05/2018, - 08/26/2017 (Stable), - 02/13/2017, - 07/23/2016 Renal calculus - 11/09/2018, - 11/25/2017, - 08/26/2017, Nephrolithiasis, - 2015 ED due to arterial insufficiency - 05/05/2018, - 02/13/2017 Weak Urinary Stream (Stable) - 05/05/2018, Weak urinary stream, - 2015 BPH w/o LUTS - 11/25/2017 Microscopic hematuria - 03/30/2017 Megaloureter, Left ureter dilated - 2015, Right ureter dilated, - 2015 Other microscopic hematuria, Microscopic hematuria - 2015 History of urolithiasis, Nephrolithiasis - 2014      PMH Notes:  2013-05-04 13:37:45 -  Note: Arthritis   NON-GU PMH: Encounter for general adult medical examination without abnormal findings, Encounter for preventive health examination - 2014 Gout, Gout - 2014 Personal history of other endocrine, nutritional and metabolic disease, History of hypercholesterolemia - 2014 Unspecified atrial fibrillation, Atrial Fibrillation - 2014     FAMILY HISTORY: Alzheimer's Disease - Mother Arthritis - Mother Death In The Family Mother - Runs In Family Diabetes - Sister Family Health Status Number - Runs In Family Family Health Status Of Father - Alive - Runs In Family Prostate Cancer - Father   SOCIAL HISTORY: Marital Status: Married Preferred Language: English; Ethnicity: Not Hispanic Or Latino; Race: White Current Smoking Status: Patient smokes.  Does not use drugs. Drinks 4+ caffeinated drinks per day. Patient's occupation is/was retired.     Notes: Current some day smoker, Cigars (1 A Day), Marital History - Currently Married, Retired From Work, Caffeine Use, Alcohol Use   REVIEW OF SYSTEMS:    GU Review Male:   Patient denies frequent urination, hard to postpone urination, burning/ pain with urination, get up at night to urinate, leakage of urine, stream starts and stops, trouble starting your stream, have to strain to urinate , erection problems, and penile pain.  Gastrointestinal (Upper):   Patient reports indigestion/ heartburn. Patient denies vomiting and nausea.  Gastrointestinal (Lower):   Patient reports constipation. Patient denies diarrhea.  Constitutional:   Patient denies fever, night sweats, weight loss, and fatigue.  Skin:   Patient denies skin rash/ lesion and itching.  Eyes:   Patient denies blurred vision and double vision.  Ears/ Nose/ Throat:   Patient denies sore throat and sinus problems.  Hematologic/Lymphatic:   Patient denies swollen glands and easy bruising.  Cardiovascular:   Patient denies leg swelling and chest pains.  Respiratory:   Patient denies cough and shortness of breath.  Endocrine:   Patient denies excessive thirst.  Musculoskeletal:   Patient reports back pain. Patient denies joint pain.  Neurological:   Patient denies headaches and dizziness.  Psychologic:   Patient denies depression and anxiety.   VITAL SIGNS:      11/11/2018 01:13 PM  Weight 175 lb / 79.38 kg  Height 66 in  / 167.64 cm  BP 145/85 mmHg  Pulse 49 /min  Temperature 98.0 F / 36.6 C  BMI 28.2 kg/m   MULTI-SYSTEM PHYSICAL EXAMINATION:    Constitutional: Well-nourished. No physical deformities. Normally developed. Good grooming.  Neck: Neck symmetrical, not swollen. Normal tracheal position.  Respiratory: No labored breathing, no use of accessory muscles.   Cardiovascular: Normal temperature, normal extremity pulses, no swelling, no varicosities.  Lymphatic: No enlargement of neck, axillae, groin.  Skin: No paleness, no jaundice, no cyanosis. No lesion, no ulcer, no rash.  Neurologic / Psychiatric: Oriented to time, oriented to place, oriented to person. No depression, no anxiety, no agitation.  Gastrointestinal: No mass, no tenderness, no rigidity, non obese abdomen.  Musculoskeletal: Normal gait and station of head and neck.     PAST DATA REVIEWED:  Source Of History:  Patient  X-Ray Review: C.T. Stone Protocol: Reviewed Films. Reviewed Report. Discussed With Patient.     07/23/16  PSA  Total PSA 1.26 ng/dl    PROCEDURES:         C.T. Urogram - [id number]                Renal Ultrasound - [id number]  Right kidney  Length: 11.2 cm Depth: 4.9 cm Cortical Width: 1.3 cm Width: 5.3 cm  Left Kidney Length: 11.8 cm Depth: 5.1 cm Cortical Width: 1.7 cm Width: 5.6 cm   Left Kidney/Ureter:  ? Multiple stones noted, largest= 1.1cm LP. ? Dilated renal pelvis.   Right Kidney/Ureter:  ? Multiple stones noted, largest LP= 1.8cm. ? Dilated renal pelvis.  Bladder:  PVR= 216.64ML.      Increased bowel gas.   ASSESSMENT:      ICD-10 Details  1 GU:   Renal calculus - N20.0    PLAN:            Medications Stop Meds: Tamsulosin Hcl 0.4 mg capsule TAKE 1 CAPSULE BY MOUTH EVERY DAY  Start: 06/09/2017  Discontinue: 11/11/2018  - Reason: The medication cycle was completed.            Orders X-Rays: C.T. Stone Protocol Without Contrast          Schedule         Document Letter(s):   Created for Patient: Clinical Summary         Notes:   Bilateral nephrolithiasis:  -We discussed URS versus ESWl and the patient elects fro ESWL. We will schedule him for 3/12 for R ESWL    * Signed by Wilkie Aye, M.D. on 11/11/18 at 1:33 PM (EST)*     The information contained in this medical record document is considered confidential patient information. This information can only be used for the medical diagnosis and/or medical services that are being provided by the patient's selected caregivers. This information can only be distributed outside of the patient's care if the patient agrees and signs waivers of authorization for this information to be sent to an outside source or route.  Addendum: I reviewed the chart, labs, CT scan.  12 mm right UPJ stone.

## 2018-12-09 ENCOUNTER — Ambulatory Visit (HOSPITAL_COMMUNITY): Payer: Medicare Other

## 2018-12-09 ENCOUNTER — Encounter (HOSPITAL_COMMUNITY): Payer: Self-pay | Admitting: General Practice

## 2018-12-09 ENCOUNTER — Ambulatory Visit (HOSPITAL_COMMUNITY)
Admission: RE | Admit: 2018-12-09 | Discharge: 2018-12-09 | Disposition: A | Discharge status: Home / Self Care | Payer: Medicare Other | Attending: Urology | Admitting: Urology

## 2018-12-09 ENCOUNTER — Encounter (HOSPITAL_COMMUNITY)
Admission: RE | Disposition: A | Discharge status: Home / Self Care | Payer: Self-pay | Source: Home / Self Care | Attending: Urology

## 2018-12-09 ENCOUNTER — Telehealth (HOSPITAL_COMMUNITY): Payer: Self-pay | Admitting: *Deleted

## 2018-12-09 DIAGNOSIS — F17200 Nicotine dependence, unspecified, uncomplicated: Secondary | ICD-10-CM | POA: Diagnosis not present

## 2018-12-09 DIAGNOSIS — E7800 Pure hypercholesterolemia, unspecified: Secondary | ICD-10-CM | POA: Diagnosis not present

## 2018-12-09 DIAGNOSIS — Z791 Long term (current) use of non-steroidal anti-inflammatories (NSAID): Secondary | ICD-10-CM | POA: Diagnosis not present

## 2018-12-09 DIAGNOSIS — N200 Calculus of kidney: Secondary | ICD-10-CM | POA: Insufficient documentation

## 2018-12-09 DIAGNOSIS — N5201 Erectile dysfunction due to arterial insufficiency: Secondary | ICD-10-CM | POA: Insufficient documentation

## 2018-12-09 DIAGNOSIS — Z01818 Encounter for other preprocedural examination: Secondary | ICD-10-CM | POA: Diagnosis not present

## 2018-12-09 DIAGNOSIS — Z7982 Long term (current) use of aspirin: Secondary | ICD-10-CM | POA: Diagnosis not present

## 2018-12-09 HISTORY — PX: EXTRACORPOREAL SHOCK WAVE LITHOTRIPSY: SHX1557

## 2018-12-09 SURGERY — LITHOTRIPSY, ESWL
Anesthesia: LOCAL | Laterality: Right

## 2018-12-09 MED ORDER — SODIUM CHLORIDE 0.9 % IV SOLN
INTRAVENOUS | Status: DC
  Administered 2018-12-09: 07:00:00 via INTRAVENOUS

## 2018-12-09 MED ORDER — DIPHENHYDRAMINE HCL 25 MG PO CAPS
25.0000 mg | ORAL_CAPSULE | ORAL | Status: AC
  Administered 2018-12-09: 25 mg via ORAL
  Filled 2018-12-09: qty 1

## 2018-12-09 MED ORDER — OXYCODONE-ACETAMINOPHEN 5-325 MG PO TABS
ORAL_TABLET | ORAL | Status: AC
  Filled 2018-12-09: qty 1

## 2018-12-09 MED ORDER — OXYCODONE-ACETAMINOPHEN 5-325 MG PO TABS: 1.0000 | ORAL_TABLET | ORAL | 0 refills | Status: AC | PRN

## 2018-12-09 MED ORDER — ASPIRIN 325 MG PO TBEC: 325.0000 mg | DELAYED_RELEASE_TABLET | Freq: Every day | ORAL | Status: DC

## 2018-12-09 MED ORDER — CIPROFLOXACIN HCL 500 MG PO TABS
500.0000 mg | ORAL_TABLET | ORAL | Status: AC
  Administered 2018-12-09: 500 mg via ORAL
  Filled 2018-12-09: qty 1

## 2018-12-09 MED ORDER — NAPROXEN SODIUM 220 MG PO TABS: 440.0000 mg | ORAL_TABLET | Freq: Two times a day (BID) | ORAL | Status: DC | PRN

## 2018-12-09 MED ORDER — IBUPROFEN 200 MG PO TABS: 400.0000 mg | ORAL_TABLET | Freq: Three times a day (TID) | ORAL | 0 refills | Status: DC | PRN

## 2018-12-09 MED ORDER — DIAZEPAM 5 MG PO TABS
10.0000 mg | ORAL_TABLET | ORAL | Status: AC
  Administered 2018-12-09: 10 mg via ORAL
  Filled 2018-12-09: qty 2

## 2018-12-09 NOTE — Interval H&P Note (Signed)
History and Physical Interval Note:  12/09/2018 7:45 AM  [name] Short  has presented today for surgery, with the diagnosis of RIGHT RENAL STONE.  The various methods of treatment have been discussed with the patient and family. After consideration of risks, benefits and other options for treatment, the patient has consented to  Procedure(s): EXTRACORPOREAL SHOCK WAVE LITHOTRIPSY (ESWL) (Right) as a surgical intervention.  The patient's history has been reviewed, patient examined, no change in status, stable for surgery.  I have reviewed the patient's chart and labs. He is well today. No cough, colds, fever. KUB with stable 12 mm right UPJ stone.  Questions were answered to the patient's satisfaction.     Jerilee Field

## 2018-12-09 NOTE — Discharge Instructions (Signed)

## 2018-12-09 NOTE — Op Note (Signed)
Right ESWL   Right 12 mm UPJ stone   Findings: pt moved quite a bit during the case. He dropped his sats and was encouraged to take deep breaths and sats returned to normal. He was gated for PVC's. Stone faded in spots and may need a staged procedure. I discussed the sleep apnea with his wife and she said she knows he snores and they know about the apnea but "he wont see anybody".

## 2018-12-10 ENCOUNTER — Encounter (HOSPITAL_COMMUNITY): Payer: Self-pay | Admitting: Urology

## 2018-12-23 DIAGNOSIS — N200 Calculus of kidney: Secondary | ICD-10-CM | POA: Diagnosis not present

## 2018-12-23 DIAGNOSIS — N202 Calculus of kidney with calculus of ureter: Secondary | ICD-10-CM | POA: Diagnosis not present

## 2019-01-13 DIAGNOSIS — N200 Calculus of kidney: Secondary | ICD-10-CM | POA: Diagnosis not present

## 2019-01-30 ENCOUNTER — Other Ambulatory Visit: Payer: Self-pay | Admitting: Family Medicine

## 2019-06-25 DIAGNOSIS — Z23 Encounter for immunization: Secondary | ICD-10-CM | POA: Diagnosis not present

## 2019-07-19 ENCOUNTER — Other Ambulatory Visit: Payer: Self-pay | Admitting: Family Medicine

## 2019-08-03 ENCOUNTER — Ambulatory Visit: Payer: Medicare Other

## 2019-08-09 ENCOUNTER — Encounter: Payer: Self-pay | Admitting: Family Medicine

## 2019-08-09 ENCOUNTER — Ambulatory Visit (INDEPENDENT_AMBULATORY_CARE_PROVIDER_SITE_OTHER): Payer: Medicare Other | Admitting: Family Medicine

## 2019-08-09 ENCOUNTER — Other Ambulatory Visit: Payer: Self-pay

## 2019-08-09 VITALS — BP 119/70 | HR 79 | Temp 97.9°F | Resp 16 | Ht 66.0 in | Wt 182.0 lb

## 2019-08-09 DIAGNOSIS — E783 Hyperchylomicronemia: Secondary | ICD-10-CM

## 2019-08-09 DIAGNOSIS — Z0000 Encounter for general adult medical examination without abnormal findings: Secondary | ICD-10-CM | POA: Diagnosis not present

## 2019-08-09 DIAGNOSIS — I480 Paroxysmal atrial fibrillation: Secondary | ICD-10-CM

## 2019-08-09 DIAGNOSIS — E663 Overweight: Secondary | ICD-10-CM

## 2019-08-09 DIAGNOSIS — Z125 Encounter for screening for malignant neoplasm of prostate: Secondary | ICD-10-CM

## 2019-08-09 LAB — CBC WITH DIFFERENTIAL/PLATELET
Basophils Absolute: 0.1 10*3/uL (ref 0.0–0.1)
Basophils Relative: 1.1 % (ref 0.0–3.0)
Eosinophils Absolute: 0.2 10*3/uL (ref 0.0–0.7)
Eosinophils Relative: 2.7 % (ref 0.0–5.0)
HCT: 42 % (ref 39.0–52.0)
Hemoglobin: 13.8 g/dL (ref 13.0–17.0)
Lymphocytes Relative: 29.4 % (ref 12.0–46.0)
Lymphs Abs: 2 10*3/uL (ref 0.7–4.0)
MCHC: 32.9 g/dL (ref 30.0–36.0)
MCV: 91.6 fl (ref 78.0–100.0)
Monocytes Absolute: 0.7 10*3/uL (ref 0.1–1.0)
Monocytes Relative: 10 % (ref 3.0–12.0)
Neutro Abs: 3.8 10*3/uL (ref 1.4–7.7)
Neutrophils Relative %: 56.8 % (ref 43.0–77.0)
Platelets: 218 10*3/uL (ref 150.0–400.0)
RBC: 4.59 Mil/uL (ref 4.22–5.81)
RDW: 13.1 % (ref 11.5–15.5)
WBC: 6.7 10*3/uL (ref 4.0–10.5)

## 2019-08-09 LAB — PSA, MEDICARE: PSA: 1.6 ng/ml (ref 0.10–4.00)

## 2019-08-09 LAB — BASIC METABOLIC PANEL
BUN: 20 mg/dL (ref 6–23)
CO2: 29 mEq/L (ref 19–32)
Calcium: 9.8 mg/dL (ref 8.4–10.5)
Chloride: 106 mEq/L (ref 96–112)
Creatinine, Ser: 1.36 mg/dL (ref 0.40–1.50)
GFR: 51.53 mL/min — ABNORMAL LOW (ref >60.00)
Glucose, Bld: 112 mg/dL — ABNORMAL HIGH (ref 70–99)
Potassium: 4.3 mEq/L (ref 3.5–5.1)
Sodium: 141 mEq/L (ref 135–145)

## 2019-08-09 LAB — HEPATIC FUNCTION PANEL
ALT: 20 U/L (ref 0–53)
AST: 23 U/L (ref 0–37)
Albumin: 4.6 g/dL (ref 3.5–5.2)
Alkaline Phosphatase: 57 U/L (ref 39–117)
Bilirubin, Direct: 0.1 mg/dL (ref 0.0–0.3)
Total Bilirubin: 0.3 mg/dL (ref 0.2–1.2)
Total Protein: 7.1 g/dL (ref 6.0–8.3)

## 2019-08-09 LAB — LIPID PANEL
Cholesterol: 166 mg/dL (ref 0–200)
HDL: 38.5 mg/dL — ABNORMAL LOW (ref >39.00)
LDL Cholesterol: 99 mg/dL (ref 0–99)
NonHDL: 127.48
Total CHOL/HDL Ratio: 4
Triglycerides: 142 mg/dL (ref 0.0–149.0)
VLDL: 28.4 mg/dL (ref 0.0–40.0)

## 2019-08-09 LAB — TSH: TSH: 2.4 u[IU]/mL (ref 0.35–4.50)

## 2019-08-09 NOTE — Assessment & Plan Note (Signed)
Pt gained 7 lbs since last visit.  Stressed need for healthy diet and regular exercise.  Check labs to risk stratify.  Will follow.

## 2019-08-09 NOTE — Patient Instructions (Addendum)
Follow up in 6 months to recheck cholesterol We'll notify you of your lab results and make any changes if needed Continue to work on healthy diet and regular exercise- you can do it! Call with any questions or concerns Stay Safe!  Stay Healthy!   Preventive Care 24 Years and Older, Male Preventive care refers to lifestyle choices and visits with your health care provider that can promote health and wellness. This includes:  A yearly physical exam. This is also called an annual well check.  Regular dental and eye exams.  Immunizations.  Screening for certain conditions.  Healthy lifestyle choices, such as diet and exercise. What can I expect for my preventive care visit? Physical exam Your health care provider will check:  Height and weight. These may be used to calculate body mass index (BMI), which is a measurement that tells if you are at a healthy weight.  Heart rate and blood pressure.  Your skin for abnormal spots. Counseling Your health care provider may ask you questions about:  Alcohol, tobacco, and drug use.  Emotional well-being.  Home and relationship well-being.  Sexual activity.  Eating habits.  History of falls.  Memory and ability to understand (cognition).  Work and work Statistician. What immunizations do I need?  Influenza (flu) vaccine  This is recommended every year. Tetanus, diphtheria, and pertussis (Tdap) vaccine  You may need a Td booster every 10 years. Varicella (chickenpox) vaccine  You may need this vaccine if you have not already been vaccinated. Zoster (shingles) vaccine  You may need this after age 40. Pneumococcal conjugate (PCV13) vaccine  One dose is recommended after age 74. Pneumococcal polysaccharide (PPSV23) vaccine  One dose is recommended after age 59. Measles, mumps, and rubella (MMR) vaccine  You may need at least one dose of MMR if you were born in 1957 or later. You may also need a second  dose. Meningococcal conjugate (MenACWY) vaccine  You may need this if you have certain conditions. Hepatitis A vaccine  You may need this if you have certain conditions or if you travel or work in places where you may be exposed to hepatitis A. Hepatitis B vaccine  You may need this if you have certain conditions or if you travel or work in places where you may be exposed to hepatitis B. Haemophilus influenzae type b (Hib) vaccine  You may need this if you have certain conditions. You may receive vaccines as individual doses or as more than one vaccine together in one shot (combination vaccines). Talk with your health care provider about the risks and benefits of combination vaccines. What tests do I need? Blood tests  Lipid and cholesterol levels. These may be checked every 5 years, or more frequently depending on your overall health.  Hepatitis C test.  Hepatitis B test. Screening  Lung cancer screening. You may have this screening every year starting at age 7 if you have a 30-pack-year history of smoking and currently smoke or have quit within the past 15 years.  Colorectal cancer screening. All adults should have this screening starting at age 82 and continuing until age 55. Your health care provider may recommend screening at age 16 if you are at increased risk. You will have tests every 1-10 years, depending on your results and the type of screening test.  Prostate cancer screening. Recommendations will vary depending on your family history and other risks.  Diabetes screening. This is done by checking your blood sugar (glucose) after you have not  eaten for a while (fasting). You may have this done every 1-3 years.  Abdominal aortic aneurysm (AAA) screening. You may need this if you are a current or former smoker.  Sexually transmitted disease (STD) testing. Follow these instructions at home: Eating and drinking  Eat a diet that includes fresh fruits and vegetables, whole  grains, lean protein, and low-fat dairy products. Limit your intake of foods with high amounts of sugar, saturated fats, and salt.  Take vitamin and mineral supplements as recommended by your health care provider.  Do not drink alcohol if your health care provider tells you not to drink.  If you drink alcohol: ? Limit how much you have to 0-2 drinks a day. ? Be aware of how much alcohol is in your drink. In the [place] one drink equals one 12 oz bottle of beer (355 mL), one 5 oz glass of wine (148 mL), or one 1 oz glass of hard liquor (44 mL). Lifestyle  Take daily care of your teeth and gums.  Stay active. Exercise for at least 30 minutes on 5 or more days each week.  Do not use any products that contain nicotine or tobacco, such as cigarettes, e-cigarettes, and chewing tobacco. If you need help quitting, ask your health care provider.  If you are sexually active, practice safe sex. Use a condom or other form of protection to prevent STIs (sexually transmitted infections).  Talk with your health care provider about taking a low-dose aspirin or statin. What's next?  Visit your health care provider once a year for a well check visit.  Ask your health care provider how often you should have your eyes and teeth checked.  Stay up to date on all vaccines. This information is not intended to replace advice given to you by your health care provider. Make sure you discuss any questions you have with your health care provider. Document Released: 10/12/2015 Document Revised: 09/09/2018 Document Reviewed: 09/09/2018 Elsevier Patient Education  2020 [employer].

## 2019-08-09 NOTE — Assessment & Plan Note (Signed)
Pt remains asymptomatic w/ regular S1/S2 and normal rate.  On ASA daily.  Will continue to follow.

## 2019-08-09 NOTE — Assessment & Plan Note (Signed)
Chronic problem.  Tolerating Fenofibrate w/o difficulty.  Stressed need for healthy diet and regular exercise.  Check labs.  Adjust meds prn

## 2019-08-09 NOTE — Progress Notes (Signed)
   Subjective:    Patient ID: [name] Short, male    DOB: 18-Aug-1946, 73 y.o.   MRN: [id number]  HPI Here today for MWV.  Risk Factors: Paroxysmal A-fib- pt has not had recent sxs.  HR 79 today. Denies palpitations, CP, SOB Hyperlipidemia- chronic problem, on Fenofibrate 160mg  daily.  Denies abd pain, N/V Overweight- pt has gained 7 lbs since last visit.  Still playing tennis, no regular diet Physical Activity: plays tennis regularly Fall Risk: low Depression: denies Hearing: decreased to conversational tones and whispered voice at 6 ft ADL's: independent Cognitive: normal linear thought process, memory and attention intact Home Safety: safe at home, lives w/ wife Height, Weight, BMI, Visual Acuity: see vitals, vision corrected to 20/20 w/ glasses Counseling: UTD on immunizations, Cologuard. Labs Ordered: See A&P Care Plan: See A&P   Patient Care Team    Relationship Specialty Notifications Start End  Midge Minium, MD PCP - General   09/11/10    Comment: Francine Graven, MD Consulting Physician Urology  07/17/15   Marybelle Killings, MD Consulting Physician Orthopedic Surgery  07/17/15   Gatha Mayer, MD Consulting Physician Gastroenterology  07/17/15   Harriett Sine, MD Consulting Physician Dermatology  07/17/15   Erroll Luna, MD Consulting Physician General Surgery  07/17/15   Coralie Keens, MD Consulting Physician General Surgery  07/23/17    Comment: hernia repair      Review of Systems Patient reports no vision/hearing changes, anorexia, fever ,adenopathy, persistant/recurrent hoarseness, swallowing issues, chest pain, palpitations, edema, persistant/recurrent cough, hemoptysis, dyspnea (rest,exertional, paroxysmal nocturnal), gastrointestinal  bleeding (melena, rectal bleeding), abdominal pain, excessive heart burn, GU symptoms (dysuria, hematuria, voiding/incontinence issues) syncope, focal weakness, memory loss, numbness & tingling,  skin/hair/nail changes, depression, anxiety, abnormal bruising/bleeding, musculoskeletal symptoms/signs.     Objective:   Physical Exam General Appearance:    Alert, cooperative, no distress, appears stated age  Head:    Normocephalic, without obvious abnormality, atraumatic  Eyes:    PERRL, conjunctiva/corneas clear, EOM's intact, fundi    benign, both eyes       Ears:    Normal TM's and external ear canals, both ears  Nose:   Deferred due to COVID  Throat:   Neck:   Supple, symmetrical, trachea midline, no adenopathy;       thyroid:  No enlargement/tenderness/nodules  Back:     Symmetric, no curvature, ROM normal, no CVA tenderness  Lungs:     Clear to auscultation bilaterally, respirations unlabored  Chest wall:    No tenderness or deformity  Heart:    Regular rate and rhythm, S1 and S2 normal, no murmur, rub   or gallop  Abdomen:     Soft, non-tender, bowel sounds active all four quadrants,    no masses, no organomegaly  Genitalia:    Deferred to urology  Rectal:    Extremities:   Extremities normal, atraumatic, no cyanosis or edema  Pulses:   2+ and symmetric all extremities  Skin:   Skin color, texture, turgor normal, no rashes or lesions  Lymph nodes:   Cervical, supraclavicular, and axillary nodes normal  Neurologic:   CNII-XII intact. Normal strength, sensation and reflexes      throughout          Assessment & Plan:

## 2019-08-10 ENCOUNTER — Encounter: Payer: Self-pay | Admitting: General Practice

## 2019-10-13 DIAGNOSIS — Z4789 Encounter for other orthopedic aftercare: Secondary | ICD-10-CM | POA: Diagnosis not present

## 2019-10-13 DIAGNOSIS — G5601 Carpal tunnel syndrome, right upper limb: Secondary | ICD-10-CM | POA: Diagnosis not present

## 2019-10-27 DIAGNOSIS — M79641 Pain in right hand: Secondary | ICD-10-CM | POA: Diagnosis not present

## 2019-11-03 DIAGNOSIS — M65351 Trigger finger, right little finger: Secondary | ICD-10-CM | POA: Insufficient documentation

## 2019-11-03 DIAGNOSIS — G5601 Carpal tunnel syndrome, right upper limb: Secondary | ICD-10-CM | POA: Diagnosis not present

## 2019-11-03 DIAGNOSIS — M79641 Pain in right hand: Secondary | ICD-10-CM | POA: Diagnosis not present

## 2019-11-03 DIAGNOSIS — Z4789 Encounter for other orthopedic aftercare: Secondary | ICD-10-CM | POA: Diagnosis not present

## 2019-12-22 DIAGNOSIS — G5601 Carpal tunnel syndrome, right upper limb: Secondary | ICD-10-CM | POA: Diagnosis not present

## 2019-12-22 DIAGNOSIS — Z4789 Encounter for other orthopedic aftercare: Secondary | ICD-10-CM | POA: Diagnosis not present

## 2019-12-22 DIAGNOSIS — M109 Gout, unspecified: Secondary | ICD-10-CM | POA: Diagnosis not present

## 2020-02-06 ENCOUNTER — Encounter: Payer: Self-pay | Admitting: Family Medicine

## 2020-02-06 ENCOUNTER — Other Ambulatory Visit: Payer: Self-pay

## 2020-02-06 ENCOUNTER — Ambulatory Visit (INDEPENDENT_AMBULATORY_CARE_PROVIDER_SITE_OTHER): Payer: Medicare Other | Admitting: Family Medicine

## 2020-02-06 VITALS — BP 123/80 | HR 81 | Temp 97.8°F | Resp 16 | Ht 66.0 in | Wt 180.0 lb

## 2020-02-06 DIAGNOSIS — G8929 Other chronic pain: Secondary | ICD-10-CM

## 2020-02-06 DIAGNOSIS — E783 Hyperchylomicronemia: Secondary | ICD-10-CM | POA: Diagnosis not present

## 2020-02-06 DIAGNOSIS — E663 Overweight: Secondary | ICD-10-CM

## 2020-02-06 DIAGNOSIS — M25561 Pain in right knee: Secondary | ICD-10-CM

## 2020-02-06 DIAGNOSIS — M25562 Pain in left knee: Secondary | ICD-10-CM

## 2020-02-06 LAB — BASIC METABOLIC PANEL
BUN: 18 mg/dL (ref 6–23)
CO2: 30 mEq/L (ref 19–32)
Calcium: 9.6 mg/dL (ref 8.4–10.5)
Chloride: 105 mEq/L (ref 96–112)
Creatinine, Ser: 1.27 mg/dL (ref 0.40–1.50)
GFR: 55.69 mL/min — ABNORMAL LOW (ref >60.00)
Glucose, Bld: 103 mg/dL — ABNORMAL HIGH (ref 70–99)
Potassium: 4.6 mEq/L (ref 3.5–5.1)
Sodium: 141 mEq/L (ref 135–145)

## 2020-02-06 LAB — HEPATIC FUNCTION PANEL
ALT: 19 U/L (ref 0–53)
AST: 28 U/L (ref 0–37)
Albumin: 4.5 g/dL (ref 3.5–5.2)
Alkaline Phosphatase: 52 U/L (ref 39–117)
Bilirubin, Direct: 0.1 mg/dL (ref 0.0–0.3)
Total Bilirubin: 0.5 mg/dL (ref 0.2–1.2)
Total Protein: 7.2 g/dL (ref 6.0–8.3)

## 2020-02-06 LAB — CBC WITH DIFFERENTIAL/PLATELET
Basophils Absolute: 0.1 10*3/uL (ref 0.0–0.1)
Basophils Relative: 1.1 % (ref 0.0–3.0)
Eosinophils Absolute: 0.7 10*3/uL (ref 0.0–0.7)
Eosinophils Relative: 8.9 % — ABNORMAL HIGH (ref 0.0–5.0)
HCT: 43.4 % (ref 39.0–52.0)
Hemoglobin: 14.1 g/dL (ref 13.0–17.0)
Lymphocytes Relative: 23.3 % (ref 12.0–46.0)
Lymphs Abs: 1.9 10*3/uL (ref 0.7–4.0)
MCHC: 32.5 g/dL (ref 30.0–36.0)
MCV: 92.7 fl (ref 78.0–100.0)
Monocytes Absolute: 0.6 10*3/uL (ref 0.1–1.0)
Monocytes Relative: 7.3 % (ref 3.0–12.0)
Neutro Abs: 4.7 10*3/uL (ref 1.4–7.7)
Neutrophils Relative %: 59.4 % (ref 43.0–77.0)
Platelets: 220 10*3/uL (ref 150.0–400.0)
RBC: 4.68 Mil/uL (ref 4.22–5.81)
RDW: 14.4 % (ref 11.5–15.5)
WBC: 8 10*3/uL (ref 4.0–10.5)

## 2020-02-06 LAB — LIPID PANEL
Cholesterol: 196 mg/dL (ref 0–200)
HDL: 38.1 mg/dL — ABNORMAL LOW (ref >39.00)
LDL Cholesterol: 131 mg/dL — ABNORMAL HIGH (ref 0–99)
NonHDL: 157.52
Total CHOL/HDL Ratio: 5
Triglycerides: 133 mg/dL (ref 0.0–149.0)
VLDL: 26.6 mg/dL (ref 0.0–40.0)

## 2020-02-06 NOTE — Assessment & Plan Note (Signed)
BMI is stable at 29.  Encouraged healthy diet and regular exercise.  Will continue to follow.

## 2020-02-06 NOTE — Progress Notes (Signed)
   Subjective:    Patient ID: [name] Short, male    DOB: June 02, 1946, 74 y.o.   MRN: [id number]  HPI Hyperlipidemia- chronic problem, on Fenofibrate 160mg  daily and Omega 3.  No CP, SOB, HAs, abd pain, N/V.  Overweight- weight is stable, BMI is 29.  Attempting to play tennis once a week.  Bilateral knee pain- L>R.  sxs started after sustaining fall last year.  Has not seen Ortho previously.  No swelling.  'they just hurt'.  Has to sleep w/ a pillow between legs due to pain.   Review of Systems For ROS see HPI   This visit occurred during the SARS-CoV-2 public health emergency.  Safety protocols were in place, including screening questions prior to the visit, additional usage of staff PPE, and extensive cleaning of exam room while observing appropriate contact time as indicated for disinfecting solutions.       Objective:   Physical Exam Vitals reviewed.  Constitutional:      General: He is not in acute distress.    Appearance: Normal appearance. He is well-developed.  HENT:     Head: Normocephalic and atraumatic.  Eyes:     Conjunctiva/sclera: Conjunctivae normal.     Pupils: Pupils are equal, round, and reactive to light.  Neck:     Thyroid: No thyromegaly.  Cardiovascular:     Rate and Rhythm: Normal rate and regular rhythm.     Heart sounds: Normal heart sounds. No murmur.  Pulmonary:     Effort: Pulmonary effort is normal. No respiratory distress.     Breath sounds: Normal breath sounds.  Abdominal:     General: Bowel sounds are normal. There is no distension.     Palpations: Abdomen is soft.  Musculoskeletal:     Cervical back: Normal range of motion and neck supple.  Lymphadenopathy:     Cervical: No cervical adenopathy.  Skin:    General: Skin is warm and dry.  Neurological:     Mental Status: He is alert and oriented to person, place, and time.     Cranial Nerves: No cranial nerve deficit.  Psychiatric:        Behavior: Behavior normal.             Assessment & Plan:  Bilateral knee pain- new.  Pt reports sxs x1 yr since he sustained a fall.  Has never had ortho evaluation.  Referral placed.  Will follow.

## 2020-02-06 NOTE — Assessment & Plan Note (Addendum)
Chronic problem.  Tolerating medication w/o difficulty.  Check labs.  Adjust meds prn  

## 2020-02-06 NOTE — Patient Instructions (Addendum)
Schedule your Medicare Wellness Visit in 6 months We'll notify you of your lab results and make any changes if needed We'll call you with your Orthopedic appt for the knee pain Continue to work on healthy diet and regular exercise- you can do it! Call with any questions or concerns Have a great summer!

## 2020-02-10 ENCOUNTER — Encounter: Payer: Self-pay | Admitting: General Practice

## 2020-02-15 ENCOUNTER — Ambulatory Visit (INDEPENDENT_AMBULATORY_CARE_PROVIDER_SITE_OTHER): Payer: Medicare Other

## 2020-02-15 ENCOUNTER — Ambulatory Visit: Payer: Self-pay

## 2020-02-15 ENCOUNTER — Ambulatory Visit (INDEPENDENT_AMBULATORY_CARE_PROVIDER_SITE_OTHER): Payer: Medicare Other | Admitting: Orthopaedic Surgery

## 2020-02-15 ENCOUNTER — Encounter: Payer: Self-pay | Admitting: Orthopaedic Surgery

## 2020-02-15 ENCOUNTER — Other Ambulatory Visit: Payer: Self-pay

## 2020-02-15 VITALS — Ht 66.0 in | Wt 180.0 lb

## 2020-02-15 DIAGNOSIS — M25562 Pain in left knee: Secondary | ICD-10-CM

## 2020-02-15 DIAGNOSIS — G8929 Other chronic pain: Secondary | ICD-10-CM

## 2020-02-15 DIAGNOSIS — M25561 Pain in right knee: Secondary | ICD-10-CM | POA: Diagnosis not present

## 2020-02-15 DIAGNOSIS — M170 Bilateral primary osteoarthritis of knee: Secondary | ICD-10-CM | POA: Diagnosis not present

## 2020-02-15 NOTE — Progress Notes (Signed)
Office Visit Note   Patient: [name] Short           Date of Birth: 1945/10/23           MRN: [id number] Visit Date: 02/15/2020              Requested by: Midge Minium, MD [address] [place],   [id number] PCP: Midge Minium, MD   Assessment & Plan: Visit Diagnoses:  1. Chronic pain of both knees   2. Bilateral primary osteoarthritis of knee     Plan: Knee injection performed left knee which she tolerated well with improvement in his symptoms.  He can follow-up in 3 months.  Follow-Up Instructions: Return in about 3 months (around 05/17/2020).   Orders:  Orders Placed This Encounter  Procedures  . Large Joint Inj: L knee  . XR KNEE 3 VIEW LEFT  . XR KNEE 3 VIEW RIGHT   No orders of the defined types were placed in this encounter.     Procedures: Large Joint Inj: L knee on 02/15/2020 11:13 AM Indications: joint swelling and pain Details: 22 G 1.5 in needle, anterolateral approach  Arthrogram: No  Medications: 0.5 mL lidocaine 1 %; 3 mL bupivacaine 0.5 %; 40 mg methylPREDNISolone acetate 40 MG/ML Outcome: tolerated well, no immediate complications Procedure, treatment alternatives, risks and benefits explained, specific risks discussed. Consent was given by the patient. Immediately prior to procedure a time out was called to verify the correct patient, procedure, equipment, support staff and site/side marked as required. Patient was prepped and draped in the usual sterile fashion.       Clinical Data: No additional findings.   Subjective: Chief Complaint  Patient presents with  . Left Knee - Pain  . Right Knee - Pain    HPI 74 year old male seen with bilateral knee pain.  He has tried Naprosyn and ibuprofen without relief.  He is not able get down on the floor due to knee pain.  Problems with stairs.  He states about a year ago he fell landed on concrete and noticed gradual increased pain playing tennis doing yard work worse on his  left knee the right knee.  He denies fever chills no locking of his knees.  Previous total hip arthroplasty on the right doing well.  Review of Systems past history of kidney stones not symptomatic currently.  Previous total hip arthroplasty on the right.   Objective: Vital Signs: Ht 5\' 6"  (1.676 m)   Wt 180 lb (81.6 kg)   BMI 29.05 kg/m   Physical Exam Constitutional:      Appearance: He is well-developed.  HENT:     Head: Normocephalic and atraumatic.  Eyes:     Pupils: Pupils are equal, round, and reactive to light.  Neck:     Thyroid: No thyromegaly.     Trachea: No tracheal deviation.  Cardiovascular:     Rate and Rhythm: Normal rate.  Pulmonary:     Effort: Pulmonary effort is normal.     Breath sounds: No wheezing.  Abdominal:     General: Bowel sounds are normal.     Palpations: Abdomen is soft.  Skin:    General: Skin is warm and dry.     Capillary Refill: Capillary refill takes less than 2 seconds.  Neurological:     Mental Status: He is alert and oriented to person, place, and time.  Psychiatric:        Behavior: Behavior normal.  Thought Content: Thought content normal.        Judgment: Judgment normal.     Ortho Exam patient has crepitus of both right and left knee range of motion.  Trace effusion left knee.  Medial lateral joint line tenderness.  No Baker's cyst.  Negative logroll of the left hip.  Specialty Comments:  No specialty comments available.  Imaging: No results found.   PMFS History: Patient Active Problem List   Diagnosis Date Noted  . Bilateral primary osteoarthritis of knee 02/15/2020  . H/O total hip arthroplasty, bilateral 10/24/2018  . Gout 08/18/2017  . Overweight (BMI 25.0-29.9) 01/23/2017  . Foot pain, left 02/11/2016  . Sebaceous cyst 07/14/2014  . Sun-damaged skin 07/14/2014  . 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/14/2014  . Physical exam, annual 05/17/2012  . HERNIA 08/12/2010  . 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  TOBACCO USER 05/01/2010  . Paroxysmal a-fib (HCC) 05/01/2010  . ABNORMAL ELECTROCARDIOGRAM 03/26/2010   Past Medical History:  Diagnosis Date  . Arthritis    WRIST  . At risk for sleep apnea    STOP-BANG=  4     SENT TO PCP 09-19-2013  . Complication of anesthesia    slow to wake  . History of concussion    AS CHILD--  NO RESIDUAL  . History of gout   . History of kidney stones    hasn't had any since the 1990's  . Hyperlipidemia   . Nephrolithiasis    BILATERAL  . PAF (paroxysmal atrial fibrillation) (HCC)    EPISODE --  2011  FOLLOW-UP W/ DR WALL  /  HAS NOT SEEN ANY CARDIOLOGIST SINCE    Family History  Problem Relation Age of Onset  . Diabetes Mother   . Arthritis Mother   . Rheum arthritis Mother   . Dementia Mother   . Heart disease Father   . Prostate cancer Father   . Diabetes Sister   . Diabetes Maternal Grandmother   . Diabetes Maternal Grandfather   . Bipolar disorder Brother   . Diabetes Brother   . Dementia Brother   . Neuropathy Brother     Past Surgical History:  Procedure Laterality Date  . APPENDECTOMY  1983  . CARDIOVASCULAR STRESS TEST  05-08-2010  DR WALL   NO EVIDENCE OF SCAR OR ISCHEMIA/ EF 54%/  PT HAD BOTH AFIB/ AFLUTTER DURING STUDY  . CYSTOSCOPY W/ URETERAL STENT PLACEMENT Left 09/20/2013   Procedure: CYSTOSCOPY WITH STENT REPLACEMENT;  Surgeon: Antony Haste, MD;  Location: [facility];  Service: Urology;  Laterality: Left;  . CYSTOSCOPY WITH URETEROSCOPY Left 09/20/2013   Procedure: CYSTOSCOPY WITH URETEROSCOPY;  Surgeon: Antony Haste, MD;  Location: [facility];  Service: Urology;  Laterality: Left;  . CYSTOSCOPY WITH URETEROSCOPY AND STENT PLACEMENT Left 07/12/2013   Procedure: CYSTOSCOPY WITH LITHOLAPEXY, LEFT URETERAL STENT PLACEMENT;  Surgeon: Antony Haste, MD;  Location: [facility];  Service: Urology;  Laterality: Left;  . CYSTOSCOPY WITH URETEROSCOPY AND  STENT PLACEMENT Bilateral 06/23/2014   Procedure: BILATERAL URETEROSCOPY, HOLMIUM LASER LITHOTRIPSY AND STENT PLACEMENT, RIGHT ;  Surgeon: Jerilee Field, MD;  Location: [facility];  Service: Urology;  Laterality: Bilateral;  . EXTRACORPOREAL SHOCK WAVE LITHOTRIPSY  X2  . EXTRACORPOREAL SHOCK WAVE LITHOTRIPSY Left 08-29-2013;   07-28-2013  . EXTRACORPOREAL SHOCK WAVE LITHOTRIPSY Right 12/09/2018   Procedure: EXTRACORPOREAL SHOCK WAVE LITHOTRIPSY (ESWL);  Surgeon: Jerilee Field, MD;  Location: [facility];  Service: Urology;  Laterality: Right;  . HOLMIUM LASER APPLICATION Left 09/20/2013   Procedure: HOLMIUM LASER APPLICATION;  Surgeon: Antony Haste, MD;  Location: [facility];  Service: Urology;  Laterality: Left;  . LAPAROSCOPIC INGUINAL HERNIA REPAIR Bilateral 09-04-2010   w/ mesh  . TONSILLECTOMY  AS CHILD  . 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 TRANSTHORACIC ECHOCARDIOGRAM  05-08-2010   NORMAL LVSF/  EF 55%/  GRADE I DIASTOLIC DYSFUNCTION/  MILD BILATERAL ATRIUM ENLARGEMENT   Social History   Occupational History  . Not on file  Tobacco Use  . Smoking status: Light Tobacco Smoker    Years: 10.00    Types: Cigars  . Smokeless tobacco: Never Used  . Tobacco comment: AVERAGE 3 CIGARS PER WEEK  Substance and Sexual Activity  . Alcohol use: Yes    Alcohol/week: 2.0 standard drinks    Types: 2 Cans of beer per week    Comment: 2 beers a week  . Drug use: No  . Sexual activity: Not on file

## 2020-02-17 MED ORDER — METHYLPREDNISOLONE ACETATE 40 MG/ML IJ SUSP
40.0000 mg | INTRAMUSCULAR | Status: AC | PRN
  Administered 2020-02-15: 40 mg via INTRA_ARTICULAR

## 2020-02-17 MED ORDER — BUPIVACAINE HCL 0.5 % IJ SOLN
3.0000 mL | INTRAMUSCULAR | Status: AC | PRN
  Administered 2020-02-15: 3 mL via INTRA_ARTICULAR

## 2020-02-17 MED ORDER — LIDOCAINE HCL 1 % IJ SOLN
0.5000 mL | INTRAMUSCULAR | Status: AC | PRN
  Administered 2020-02-15: .5 mL

## 2020-05-22 ENCOUNTER — Encounter: Payer: Self-pay | Admitting: Orthopaedic Surgery

## 2020-05-22 ENCOUNTER — Ambulatory Visit (INDEPENDENT_AMBULATORY_CARE_PROVIDER_SITE_OTHER): Payer: Medicare Other | Admitting: Orthopaedic Surgery

## 2020-05-22 DIAGNOSIS — Z96643 Presence of artificial hip joint, bilateral: Secondary | ICD-10-CM

## 2020-05-22 DIAGNOSIS — M170 Bilateral primary osteoarthritis of knee: Secondary | ICD-10-CM

## 2020-05-22 NOTE — Progress Notes (Signed)
Office Visit Note   Patient: [name] Short           Date of Birth: Jan 20, 1946           MRN: [id number] Visit Date: 05/22/2020              Requested by: Sheliah Hatch, MD [address] [place],  [place] [address] PCP: Sheliah Hatch, MD   Assessment & Plan: Visit Diagnoses:  1. Bilateral primary osteoarthritis of knee   2. H/O total hip arthroplasty, bilateral     Plan: Patient has moderate bilateral knee osteoarthritis seems to be functioning well was playing tennis and just using anti-inflammatories.  Injection main his left knee was very helpful for him.  I will check him back again on an as-needed basis he will call if he has problems.  Follow-Up Instructions: No follow-ups on file.   Orders:  No orders of the defined types were placed in this encounter.  No orders of the defined types were placed in this encounter.     Procedures: No procedures performed   Clinical Data: No additional findings.   Subjective: Chief Complaint  Patient presents with  . Right Knee - Follow-up  . Left Knee - Follow-up    HPI 74 year old male returns for follow-up of bilateral knee osteoarthritis.  He states the left knee injection in May gave him good relief of the medial pain.  Right knee still hurts laterally but is still able to play tennis.  Had carpal tunnel release by Dr. Amanda Pea still has some numbness of the tips of states wrist feels better.  He uses anti-inflammatories.  Had left posterior total hip arthroplasty by me 2003 right hip was done Anteriorly 2004.  Both hips doing well.  Patient has trace effusion his knee at times does not stop him from doing anything he sleeping well does not limp.  Review of Systems 14 point system update unchanged from 02/15/2020.   Objective: Vital Signs: There were no vitals taken for this visit.  Physical Exam Constitutional:      Appearance: He is well-developed.  HENT:     Head: Normocephalic and atraumatic.    Eyes:     Pupils: Pupils are equal, round, and reactive to light.  Neck:     Thyroid: No thyromegaly.     Trachea: No tracheal deviation.  Cardiovascular:     Rate and Rhythm: Normal rate.  Pulmonary:     Effort: Pulmonary effort is normal.     Breath sounds: No wheezing.  Abdominal:     General: Bowel sounds are normal.     Palpations: Abdomen is soft.  Skin:    General: Skin is warm and dry.     Capillary Refill: Capillary refill takes less than 2 seconds.  Neurological:     Mental Status: He is alert and oriented to person, place, and time.  Psychiatric:        Behavior: Behavior normal.        Thought Content: Thought content normal.        Judgment: Judgment normal.     Ortho Exam patient is amatory without limping.  Negative logroll to the hips knees reach full extension. Specialty Comments:  No specialty comments available.  Imaging: No results found.   PMFS History: Patient Active Problem List   Diagnosis Date Noted  . Bilateral primary osteoarthritis of knee 02/15/2020  . H/O total hip arthroplasty, bilateral 10/24/2018  . Gout 08/18/2017  .  Overweight (BMI 25.0-29.9) 01/23/2017  . Foot pain, left 02/11/2016  . Sebaceous cyst 07/14/2014  . Sun-damaged skin 07/14/2014  . 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/14/2014  . Physical exam, annual 05/17/2012  . HERNIA 08/12/2010  . 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 TOBACCO USER 05/01/2010  . Paroxysmal a-fib (HCC) 05/01/2010  . ABNORMAL ELECTROCARDIOGRAM 03/26/2010   Past Medical History:  Diagnosis Date  . Arthritis    WRIST  . At risk for sleep apnea    STOP-BANG=  4     SENT TO PCP 09-19-2013  . Complication of anesthesia    slow to wake  . History of concussion    AS CHILD--  NO RESIDUAL  . History of gout   . History of kidney stones    hasn't had any since the 1990's  . Hyperlipidemia   . Nephrolithiasis    BILATERAL  . PAF (paroxysmal atrial fibrillation) (HCC)    EPISODE --  2011  FOLLOW-UP W/  DR WALL  /  HAS NOT SEEN ANY CARDIOLOGIST SINCE    Family History  Problem Relation Age of Onset  . Diabetes Mother   . Arthritis Mother   . Rheum arthritis Mother   . Dementia Mother   . Heart disease Father   . Prostate cancer Father   . Diabetes Sister   . Diabetes Maternal Grandmother   . Diabetes Maternal Grandfather   . Bipolar disorder Brother   . Diabetes Brother   . Dementia Brother   . Neuropathy Brother     Past Surgical History:  Procedure Laterality Date  . APPENDECTOMY  1983  . CARDIOVASCULAR STRESS TEST  05-08-2010  DR WALL   NO EVIDENCE OF SCAR OR ISCHEMIA/ EF 54%/  PT HAD BOTH AFIB/ AFLUTTER DURING STUDY  . CYSTOSCOPY W/ URETERAL STENT PLACEMENT Left 09/20/2013   Procedure: CYSTOSCOPY WITH STENT REPLACEMENT;  Surgeon: Antony Haste, MD;  Location: [facility];  Service: Urology;  Laterality: Left;  . CYSTOSCOPY WITH URETEROSCOPY Left 09/20/2013   Procedure: CYSTOSCOPY WITH URETEROSCOPY;  Surgeon: Antony Haste, MD;  Location: [facility];  Service: Urology;  Laterality: Left;  . CYSTOSCOPY WITH URETEROSCOPY AND STENT PLACEMENT Left 07/12/2013   Procedure: CYSTOSCOPY WITH LITHOLAPEXY, LEFT URETERAL STENT PLACEMENT;  Surgeon: Antony Haste, MD;  Location: [facility];  Service: Urology;  Laterality: Left;  . CYSTOSCOPY WITH URETEROSCOPY AND STENT PLACEMENT Bilateral 06/23/2014   Procedure: BILATERAL URETEROSCOPY, HOLMIUM LASER LITHOTRIPSY AND STENT PLACEMENT, RIGHT ;  Surgeon: Jerilee Field, MD;  Location: [facility];  Service: Urology;  Laterality: Bilateral;  . EXTRACORPOREAL SHOCK WAVE LITHOTRIPSY  X2  . EXTRACORPOREAL SHOCK WAVE LITHOTRIPSY Left 08-29-2013;   07-28-2013  . EXTRACORPOREAL SHOCK WAVE LITHOTRIPSY Right 12/09/2018   Procedure: EXTRACORPOREAL SHOCK WAVE LITHOTRIPSY (ESWL);  Surgeon: Jerilee Field, MD;  Location: [facility];  Service: Urology;  Laterality: Right;   . HOLMIUM LASER APPLICATION Left 09/20/2013   Procedure: HOLMIUM LASER APPLICATION;  Surgeon: Antony Haste, MD;  Location[facility];  Service: Urology;  Laterality: Left;  . LAPAROSCOPIC INGUINAL HERNIA REPAIR Bilateral 09-04-2010   w/ mesh  . TONSILLECTOMY  AS CHILD  . 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 TRANSTHORACIC ECHOCARDIOGRAM  05-08-2010  NORMAL LVSF/  EF 55%/  GRADE I DIASTOLIC DYSFUNCTION/  MILD BILATERAL ATRIUM ENLARGEMENT   Social History   Occupational History  . Not on file  Tobacco Use  . Smoking status: Light Tobacco Smoker    Years: 10.00    Types: Cigars  . Smokeless tobacco: Never Used  . Tobacco comment: AVERAGE 3 CIGARS PER WEEK  Vaping Use  . Vaping Use: Never used  Substance and Sexual Activity  . Alcohol use: Yes    Alcohol/week: 2.0 standard drinks    Types: 2 Cans of beer per week    Comment: 2 beers a week  . Drug use: No  . Sexual activity: Not on file

## 2020-06-02 ENCOUNTER — Other Ambulatory Visit: Payer: Self-pay | Admitting: Family Medicine

## 2020-08-10 NOTE — Progress Notes (Signed)
Subjective:   [name] Short is a 74 y.o. male who presents for Medicare Annual/Subsequent preventive examination.  Review of Systems     Cardiac Risk Factors include: advanced age (>4055men, 19>65 women);dyslipidemia;male gender     Objective:    Today's Vitals   08/13/20 0902 08/13/20 0905  BP: 112/74   Pulse: (!) 59   Resp: 16   Temp: 97.8 F (36.6 C)   TempSrc: Temporal   SpO2: 97%   Weight: 183 lb 3.2 oz (83.1 kg)   Height: 5\' 6"  (1.676 m)   PainSc:  3    Body mass index is 29.57 kg/m.  Advanced Directives 08/13/2020 12/09/2018 07/28/2018 07/23/2017 06/23/2014 09/20/2013 08/29/2013  Does Patient Have a Medical Advance Directive? No (No Data) No No Yes Patient has advance directive, copy not in chart Patient has advance directive, copy not in chart  Type of Advance Directive - - - - Living will Living will Healthcare Power of [place];Living will  Does patient want to make changes to medical advance directive? - - - - No - Patient declined - -  Copy of Healthcare Power of Attorney in Chart? - - - - No - copy requested - Copy requested from family  Would patient like information on creating a medical advance directive? Yes (MAU/Ambulatory/Procedural Areas - Information given) - Yes (MAU/Ambulatory/Procedural Areas - Information given) Yes (MAU/Ambulatory/Procedural Areas - Information given) - - -  Pre-existing out of facility DNR order (yellow form or pink MOST form) - - - - - - No    Current Medications (verified) Outpatient Encounter Medications as of 08/13/2020  Medication Sig  . aspirin 325 MG EC tablet Take 1 tablet (325 mg total) by mouth daily.  . Calcium Carbonate-Vitamin D (CALCIUM 600 + D PO) Take 1 tablet by mouth daily.  . fenofibrate 160 MG tablet TAKE 1 TABLET BY MOUTH  DAILY  . ibuprofen (ADVIL,MOTRIN) 200 MG tablet Take 2 tablets (400 mg total) by mouth 3 (three) times daily as needed for mild pain or moderate pain. For pain  . Multiple Vitamin  (MULTIVITAMIN WITH MINERALS) TABS Take 1 tablet by mouth daily.  . naproxen sodium (ALEVE) 220 MG tablet Take 2 tablets (440 mg total) by mouth 2 (two) times daily as needed (pain). For pain  . Omega 3 1000 MG CAPS Take by mouth. (Patient not taking: Reported on 08/13/2020)  . tamsulosin (FLOMAX) 0.4 MG CAPS capsule 0.4 mg daily.  (Patient not taking: Reported on 08/13/2020)   No facility-administered encounter medications on file as of 08/13/2020.    Allergies (verified) Warfarin sodium   History: Past Medical History:  Diagnosis Date  . Arthritis    WRIST  . At risk for sleep apnea    STOP-BANG=  4     SENT TO PCP 09-19-2013  . Complication of anesthesia    slow to wake  . History of concussion    AS CHILD--  NO RESIDUAL  . History of gout   . History of kidney stones    hasn't had any since the 1990's  . Hyperlipidemia   . Nephrolithiasis    BILATERAL  . PAF (paroxysmal atrial fibrillation) (HCC)    EPISODE --  2011  FOLLOW-UP W/ DR WALL  /  HAS NOT SEEN ANY CARDIOLOGIST SINCE   Past Surgical History:  Procedure Laterality Date  . APPENDECTOMY  1983  . CARDIOVASCULAR STRESS TEST  05-08-2010  DR WALL   NO EVIDENCE OF SCAR OR ISCHEMIA/ EF 54%/  PT HAD BOTH AFIB/ AFLUTTER DURING STUDY  . carpel tunnel    . CYSTOSCOPY W/ URETERAL STENT PLACEMENT Left 09/20/2013   Procedure: CYSTOSCOPY WITH STENT REPLACEMENT;  Surgeon: Antony Haste, MD;  Location: [facility];  Service: Urology;  Laterality: Left;  . CYSTOSCOPY WITH URETEROSCOPY Left 09/20/2013   Procedure: CYSTOSCOPY WITH URETEROSCOPY;  Surgeon: Antony Haste, MD;  Location[facility];  Service: Urology;  Laterality: Left;  . CYSTOSCOPY WITH URETEROSCOPY AND STENT PLACEMENT Left 07/12/2013   Procedure: CYSTOSCOPY WITH LITHOLAPEXY, LEFT URETERAL STENT PLACEMENT;  Surgeon: Antony Haste, MD;  Location: [facility];  Service: Urology;  Laterality:  Left;  . CYSTOSCOPY WITH URETEROSCOPY AND STENT PLACEMENT Bilateral 06/23/2014   Procedure: BILATERAL URETEROSCOPY, HOLMIUM LASER LITHOTRIPSY AND STENT PLACEMENT, RIGHT ;  Surgeon: Jerilee Field, MD;  Location: [facility];  Service: Urology;  Laterality: Bilateral;  . EXTRACORPOREAL SHOCK WAVE LITHOTRIPSY  X2  . EXTRACORPOREAL SHOCK WAVE LITHOTRIPSY Left 08-29-2013;   07-28-2013  . EXTRACORPOREAL SHOCK WAVE LITHOTRIPSY Right 12/09/2018   Procedure: EXTRACORPOREAL SHOCK WAVE LITHOTRIPSY (ESWL);  Surgeon: Jerilee Field, MD;  Location: [facility];  Service: Urology;  Laterality: Right;  . HOLMIUM LASER APPLICATION Left 09/20/2013   Procedure: HOLMIUM LASER APPLICATION;  Surgeon: Antony Haste, MD;  Location: [facility];  Service: Urology;  Laterality: Left;  . LAPAROSCOPIC INGUINAL HERNIA REPAIR Bilateral 09-04-2010   w/ mesh  . TONSILLECTOMY  AS CHILD  . 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 TRANSTHORACIC ECHOCARDIOGRAM  05-08-2010   NORMAL LVSF/  EF 55%/  GRADE I DIASTOLIC DYSFUNCTION/  MILD BILATERAL ATRIUM ENLARGEMENT   Family History  Problem Relation Age of Onset  . Diabetes Mother   . Arthritis Mother   . Rheum arthritis Mother   . Dementia Mother   . Heart disease Father   . Prostate cancer Father   . Diabetes Sister   . Diabetes Maternal Grandmother   . Diabetes Maternal Grandfather   . Bipolar disorder Brother   . Diabetes Brother   . Dementia Brother   . Neuropathy Brother    Social History   Socioeconomic History  . Marital status: Married    Spouse name: Not on file  . Number of children: Not on file  . Years of education: Not on file  . Highest education level: Not on file  Occupational History  .  Occupation: retired  Tobacco Use  . Smoking status: Light Tobacco Smoker    Years: 10.00    Types: Cigars  . Smokeless tobacco: Never Used  . Tobacco comment: AVERAGE 3 CIGARS PER WEEK  Vaping Use  . Vaping Use: Never used  Substance and Sexual Activity  . Alcohol use: Yes    Alcohol/week: 2.0 standard drinks    Types: 2 Cans of beer per week    Comment: 2 beers a week  . Drug use: No  . Sexual activity: Not on file  Other Topics Concern  . Not on file  Social History Narrative  . Not on file   Social Determinants of Health   Financial Resource Strain: Low Risk   . Difficulty of Paying Living Expenses: Not hard at all  Food Insecurity: No Food Insecurity  . Worried  About Running Out of Food in the Last Year: Never true  . Ran Out of Food in the Last Year: Never true  Transportation Needs: No Transportation Needs  . Lack of Transportation (Medical): No  . Lack of Transportation (Non-Medical): No  Physical Activity: Sufficiently Active  . Days of Exercise per Week: 6 days  . Minutes of Exercise per Session: 60 min  Stress: No Stress Concern Present  . Feeling of Stress : Not at all  Social Connections: Moderately Isolated  . Frequency of Communication with Friends and Family: More than three times a week  . Frequency of Social Gatherings with Friends and Family: Once a week  . Attends Religious Services: Never  . Active Member of Clubs or Organizations: No  . Attends Banker Meetings: Never  . Marital Status: Married    Tobacco Counseling Ready to quit: Not Answered Counseling given: Not Answered Comment: AVERAGE 3 CIGARS PER WEEK   Clinical Intake:  Pre-visit preparation completed: Yes  Pain : 0-10 Pain Score: 3  Pain Type: Chronic pain Pain Location: Back Pain Onset: More than a month ago Pain Frequency: Intermittent Pain Relieving Factors: ibuprofen, Heating pad  Pain Relieving Factors: ibuprofen, Heating pad  Nutritional Status: BMI 25  -29 Overweight Nutritional Risks: None Diabetes: No  How often do you need to have someone help you when you read instructions, pamphlets, or other written materials from your doctor or pharmacy?: 1 - Never What is the last grade level you completed in school?: College  Diabetic?No  Interpreter Needed?: No  Information entered by :: [employer]   Activities of Daily Living In your present state of health, do you have any difficulty performing the following activities: 08/13/2020 08/13/2020  Hearing? N N  Vision? N N  Difficulty concentrating or making decisions? N N  Walking or climbing stairs? N N  Dressing or bathing? N N  Doing errands, shopping? N N  Preparing Food and eating ? - N  Using the Toilet? - N  In the past six months, have you accidently leaked urine? - N  Do you have problems with loss of bowel control? - N  Managing your Medications? - N  Managing your Finances? - N  Housekeeping or managing your Housekeeping? - N  Some recent data might be hidden    Patient Care Team: Sheliah Hatch, MD as PCP - General Jerilee Field, MD as Consulting Physician (Urology) Eldred Manges, MD as Consulting Physician (Orthopedic Surgery) Iva Boop, MD as Consulting Physician (Gastroenterology) Aris Lot, MD as Consulting Physician (Dermatology) Harriette Bouillon, MD as Consulting Physician (General Surgery) Abigail Miyamoto, MD as Consulting Physician (General Surgery)  Indicate any recent Medical Services you may have received from other than [facility] providers in the past year (date may be approximate).     Assessment:   This is a routine wellness examination for [name] Short.  Hearing/Vision screen  Hearing Screening   125Hz  250Hz  500Hz  1000Hz  2000Hz  3000Hz  4000Hz  6000Hz  8000Hz   Right ear:           Left ear:           Comments: No issues  Vision Screening Comments: Reading glasses Last eye exam-04/2020-[facility]  Dietary issues and  exercise activities discussed: Current Exercise Habits: Home exercise routine, Time (Minutes): 60, Frequency (Times/Week): 6, Weekly Exercise (Minutes/Week): 360, Intensity: Mild, Exercise limited by: None identified (tennis & yard work)  Goals      Patient Stated   .  patient (pt-stated)      Maintain current health by staying active.       Other   .  Weight (lb) < 175 lb (79.4 kg)      Lose weight by decreasing portion size.       Depression Screen PHQ 2/9 Scores 08/13/2020 08/13/2020 02/06/2020 08/09/2019 08/09/2019 08/05/2018 07/28/2018  PHQ - 2 Score 0 0 0 0 0 0 0  PHQ- 9 Score 0 - 0 0 0 0 -    Fall Risk Fall Risk  08/13/2020 08/13/2020 02/06/2020 08/09/2019 08/09/2019  Falls in the past year? 0 0 Number falls in past yr: 0 0 0 0 0  Injury with Fall? 0 0 1 0 0  Comment - - - possible knee issue possible knee issues  Risk for fall due to : No Fall Risks - - - -  Follow up - Falls prevention discussed Falls evaluation completed - Falls evaluation completed    Any stairs in or around the home? Yes  If so, are there any without handrails? Yes  Home free of loose throw rugs in walkways, pet beds, electrical cords, etc? Yes  Adequate lighting in your home to reduce risk of falls? Yes   ASSISTIVE DEVICES UTILIZED TO PREVENT FALLS:  Life alert? No  Use of a cane, walker or w/c? No  Grab bars in the bathroom? Yes  Shower chair or bench in shower? No  Elevated toilet seat or a handicapped toilet? No   TIMED UP AND GO:  Was the test performed? Yes .  Length of time to ambulate 10 feet: 9 sec.   Gait steady and fast without use of assistive device  Cognitive Function:No cognitive impairment noted MMSE - Mini Mental State Exam 07/28/2018  Orientation to time 5  Orientation to Place 5  Registration 3  Attention/ Calculation 5  Recall 3  Language- name 2 objects 2  Language- repeat 1  Language- follow 3 step command 3  Language- read & follow direction 1  Write  a sentence 1  Copy design 1  Total score 30        Immunizations Immunization History  Administered Date(s) Administered  . Fluad Quad(high Dose 65+) 06/25/2019  . Influenza Split 08/24/2012  . Influenza Whole 08/26/2010  . Influenza, High Dose Seasonal PF 07/07/2014, 07/23/2017, 07/28/2018  . Influenza,inj,Quad PF,6+ Mos 06/08/2013, 07/17/2015, 07/18/2016  . Moderna SARS-COVID-2 Vaccination 10/24/2019, 11/21/2019  . Pneumococcal Conjugate-13 07/14/2014  . Pneumococcal Polysaccharide-23 06/08/2013  . Tdap 09/16/2016  . Typhoid Live 06/11/2017  . Zoster 05/17/2012    TDAP status: Up to date   Flu Vaccine status: Completed at today's visit with PCP  Pneumococcal vaccine status: Up to date   Covid-19 vaccine status: Completed vaccines  Qualifies for Shingles Vaccine? Yes   Zostavax completed Yes   Shingrix Completed?: No.    Education has been provided regarding the importance of this vaccine. Patient has been advised to call insurance company to determine out of pocket expense if they have not yet received this vaccine. Advised may also receive vaccine at local pharmacy or Health Dept. Verbalized acceptance and understanding.  Screening Tests Health Maintenance  Topic Date Due  . INFLUENZA VACCINE  04/29/2020  . Fecal DNA (Cologuard)  11/02/2020  . TETANUS/TDAP  09/16/2026  . COVID-19 Vaccine  Completed  . Hepatitis C Screening  Completed  . PNA vac Low Risk Adult  Completed    Health Maintenance  Health Maintenance Due  Topic Date Due  . INFLUENZA VACCINE  04/29/2020    Colorectal cancer screening: Completed Cologuard 11/02/2017. Repeat every 3 years  Lung Cancer Screening: (Low Dose CT Chest recommended if Age 66-80 years, 30 pack-year currently smoking OR have quit w/in 15years.) does not qualify.     Additional Screening:  Hepatitis C Screening: Completed 07/18/2016  Vision Screening: Recommended annual ophthalmology exams for early detection of glaucoma  and other disorders of the eye. Is the patient up to date with their annual eye exam?  Yes  Who is the provider or what is the name of the office in which the patient attends annual eye exams? [facility]   Dental Screening: Recommended annual dental exams for proper oral hygiene  Community Resource Referral / Chronic Care Management: CRR required this visit?  No   CCM required this visit?  No      Plan:     I have personally reviewed and noted the following in the patient's chart:   . Medical and social history . Use of alcohol, tobacco or illicit drugs  . Current medications and supplements . Functional ability and status . Nutritional status . Physical activity . Advanced directives . List of other physicians . Hospitalizations, surgeries, and ER visits in previous 12 months . Vitals . Screenings to include cognitive, depression, and falls . Referrals and appointments  In addition, I have reviewed and discussed with patient certain preventive protocols, quality metrics, and best practice recommendations. A written personalized care plan for preventive services as well as general preventive health recommendations were provided to patient.     Roanna Raider, LPN   [id number]  Nurse Health Advisot  Nurse Notes: None

## 2020-08-13 ENCOUNTER — Ambulatory Visit (INDEPENDENT_AMBULATORY_CARE_PROVIDER_SITE_OTHER): Payer: Medicare Other | Admitting: Family Medicine

## 2020-08-13 ENCOUNTER — Other Ambulatory Visit: Payer: Self-pay

## 2020-08-13 ENCOUNTER — Ambulatory Visit (INDEPENDENT_AMBULATORY_CARE_PROVIDER_SITE_OTHER): Payer: Medicare Other

## 2020-08-13 ENCOUNTER — Encounter: Payer: Self-pay | Admitting: Family Medicine

## 2020-08-13 VITALS — BP 112/74 | HR 59 | Temp 97.8°F | Resp 16 | Ht 66.0 in | Wt 183.2 lb

## 2020-08-13 DIAGNOSIS — K4020 Bilateral inguinal hernia, without obstruction or gangrene, not specified as recurrent: Secondary | ICD-10-CM

## 2020-08-13 DIAGNOSIS — E783 Hyperchylomicronemia: Secondary | ICD-10-CM

## 2020-08-13 DIAGNOSIS — Z125 Encounter for screening for malignant neoplasm of prostate: Secondary | ICD-10-CM

## 2020-08-13 DIAGNOSIS — Z0000 Encounter for general adult medical examination without abnormal findings: Secondary | ICD-10-CM | POA: Diagnosis not present

## 2020-08-13 DIAGNOSIS — Z23 Encounter for immunization: Secondary | ICD-10-CM

## 2020-08-13 DIAGNOSIS — I480 Paroxysmal atrial fibrillation: Secondary | ICD-10-CM

## 2020-08-13 DIAGNOSIS — E663 Overweight: Secondary | ICD-10-CM

## 2020-08-13 LAB — BASIC METABOLIC PANEL
BUN: 17 mg/dL (ref 6–23)
CO2: 30 mEq/L (ref 19–32)
Calcium: 9.7 mg/dL (ref 8.4–10.5)
Chloride: 105 mEq/L (ref 96–112)
Creatinine, Ser: 1.29 mg/dL (ref 0.40–1.50)
GFR: 55.25 mL/min — ABNORMAL LOW (ref >60.00)
Glucose, Bld: 91 mg/dL (ref 70–99)
Potassium: 4.8 mEq/L (ref 3.5–5.1)
Sodium: 142 mEq/L (ref 135–145)

## 2020-08-13 LAB — LIPID PANEL
Cholesterol: 162 mg/dL (ref 0–200)
HDL: 35.9 mg/dL — ABNORMAL LOW (ref >39.00)
LDL Cholesterol: 93 mg/dL (ref 0–99)
NonHDL: 125.96
Total CHOL/HDL Ratio: 5
Triglycerides: 165 mg/dL — ABNORMAL HIGH (ref 0.0–149.0)
VLDL: 33 mg/dL (ref 0.0–40.0)

## 2020-08-13 LAB — CBC WITH DIFFERENTIAL/PLATELET
Basophils Absolute: 0.1 10*3/uL (ref 0.0–0.1)
Basophils Relative: 1 % (ref 0.0–3.0)
Eosinophils Absolute: 0.3 10*3/uL (ref 0.0–0.7)
Eosinophils Relative: 3.7 % (ref 0.0–5.0)
HCT: 45.3 % (ref 39.0–52.0)
Hemoglobin: 14.7 g/dL (ref 13.0–17.0)
Lymphocytes Relative: 25.9 % (ref 12.0–46.0)
Lymphs Abs: 1.9 10*3/uL (ref 0.7–4.0)
MCHC: 32.4 g/dL (ref 30.0–36.0)
MCV: 91.4 fl (ref 78.0–100.0)
Monocytes Absolute: 0.6 10*3/uL (ref 0.1–1.0)
Monocytes Relative: 7.8 % (ref 3.0–12.0)
Neutro Abs: 4.5 10*3/uL (ref 1.4–7.7)
Neutrophils Relative %: 61.6 % (ref 43.0–77.0)
Platelets: 216 10*3/uL (ref 150.0–400.0)
RBC: 4.95 Mil/uL (ref 4.22–5.81)
RDW: 13.2 % (ref 11.5–15.5)
WBC: 7.3 10*3/uL (ref 4.0–10.5)

## 2020-08-13 LAB — HEPATIC FUNCTION PANEL
ALT: 18 U/L (ref 0–53)
AST: 24 U/L (ref 0–37)
Albumin: 4.5 g/dL (ref 3.5–5.2)
Alkaline Phosphatase: 46 U/L (ref 39–117)
Bilirubin, Direct: 0.1 mg/dL (ref 0.0–0.3)
Total Bilirubin: 0.6 mg/dL (ref 0.2–1.2)
Total Protein: 7 g/dL (ref 6.0–8.3)

## 2020-08-13 LAB — PSA, MEDICARE: PSA: 1.89 ng/ml (ref 0.10–4.00)

## 2020-08-13 LAB — TSH: TSH: 2.39 u[IU]/mL (ref 0.35–4.50)

## 2020-08-13 NOTE — Assessment & Plan Note (Signed)
Normal sinus rhythm.  No evidence of recurrence since his episode of long Afib.  On full dose ASA.  Will continue to monitor.

## 2020-08-13 NOTE — Assessment & Plan Note (Signed)
Bilateral.  Pt had previous repair in 2011 w/ Dr Magnus Ivan and had a good experience.  Will refer back for treatment of bilateral inguinal hernias.  Pt expressed understanding and is in agreement w/ plan.

## 2020-08-13 NOTE — Patient Instructions (Signed)
[name] Short , Thank you for taking time to complete your Medicare Wellness Visit. I appreciate your ongoing commitment to your health goals. Please review the following plan we discussed and let me know if I can assist you in the future.   Screening recommendations/referrals: Colonoscopy: Cologuard completed 11/02/2017- Due 11/02/2020 Recommended yearly ophthalmology/optometry visit for glaucoma screening and checkup Recommended yearly dental visit for hygiene and checkup  Vaccinations: Influenza vaccine: Completed at today's visit with Dr. Beverely Low Pneumococcal vaccine: Completed vaccines Tdap vaccine: Up to date- Due-09/16/2026 Shingles vaccine: Discuss with pharmacy   Covid-19: Completed vaccines  Advanced directives: Information given today  Conditions/risks identified: See problem list  Next appointment: Follow up in one year for your annual wellness visit.   Preventive Care 9 Years and Older, Male Preventive care refers to lifestyle choices and visits with your health care provider that can promote health and wellness. What does preventive care include?  A yearly physical exam. This is also called an annual well check.  Dental exams once or twice a year.  Routine eye exams. Ask your health care provider how often you should have your eyes checked.  Personal lifestyle choices, including:  Daily care of your teeth and gums.  Regular physical activity.  Eating a healthy diet.  Avoiding tobacco and drug use.  Limiting alcohol use.  Practicing safe sex.  Taking low doses of aspirin every day.  Taking vitamin and mineral supplements as recommended by your health care provider. What happens during an annual well check? The services and screenings done by your health care provider during your annual well check will depend on your age, overall health, lifestyle risk factors, and family history of disease. Counseling  Your health care provider may ask you questions about  your:  Alcohol use.  Tobacco use.  Drug use.  Emotional well-being.  Home and relationship well-being.  Sexual activity.  Eating habits.  History of falls.  Memory and ability to understand (cognition).  Work and work Astronomer. Screening  You may have the following tests or measurements:  Height, weight, and BMI.  Blood pressure.  Lipid and cholesterol levels. These may be checked every 5 years, or more frequently if you are over 17 years old.  Skin check.  Lung cancer screening. You may have this screening every year starting at age 30 if you have a 30-pack-year history of smoking and currently smoke or have quit within the past 15 years.  Fecal occult blood test (FOBT) of the stool. You may have this test every year starting at age 68.  Flexible sigmoidoscopy or colonoscopy. You may have a sigmoidoscopy every 5 years or a colonoscopy every 10 years starting at age 58.  Prostate cancer screening. Recommendations will vary depending on your family history and other risks.  Hepatitis C blood test.  Hepatitis B blood test.  Sexually transmitted disease (STD) testing.  Diabetes screening. This is done by checking your blood sugar (glucose) after you have not eaten for a while (fasting). You may have this done every 1-3 years.  Abdominal aortic aneurysm (AAA) screening. You may need this if you are a current or former smoker.  Osteoporosis. You may be screened starting at age 33 if you are at high risk. Talk with your health care provider about your test results, treatment options, and if necessary, the need for more tests. Vaccines  Your health care provider may recommend certain vaccines, such as:  Influenza vaccine. This is recommended every year.  Tetanus, diphtheria, and  acellular pertussis (Tdap, Td) vaccine. You may need a Td booster every 10 years.  Zoster vaccine. You may need this after age 25.  Pneumococcal 13-valent conjugate (PCV13) vaccine.  One dose is recommended after age 28.  Pneumococcal polysaccharide (PPSV23) vaccine. One dose is recommended after age 54. Talk to your health care provider about which screenings and vaccines you need and how often you need them. This information is not intended to replace advice given to you by your health care provider. Make sure you discuss any questions you have with your health care provider. Document Released: 10/12/2015 Document Revised: 06/04/2016 Document Reviewed: 07/17/2015 Elsevier Interactive Patient Education  2017 [place] Prevention in the Home Falls can cause injuries. They can happen to people of all ages. There are many things you can do to make your home safe and to help prevent falls. What can I do on the outside of my home?  Regularly fix the edges of walkways and driveways and fix any cracks.  Remove anything that might make you trip as you walk through a door, such as a raised step or threshold.  Trim any bushes or trees on the path to your home.  Use bright outdoor lighting.  Clear any walking paths of anything that might make someone trip, such as rocks or tools.  Regularly check to see if handrails are loose or broken. Make sure that both sides of any steps have handrails.  Any raised decks and porches should have guardrails on the edges.  Have any leaves, snow, or ice cleared regularly.  Use sand or salt on walking paths during winter.  Clean up any spills in your garage right away. This includes oil or grease spills. What can I do in the bathroom?  Use night lights.  Install grab bars by the toilet and in the tub and shower. Do not use towel bars as grab bars.  Use non-skid mats or decals in the tub or shower.  If you need to sit down in the shower, use a plastic, non-slip stool.  Keep the floor dry. Clean up any water that spills on the floor as soon as it happens.  Remove soap buildup in the tub or shower regularly.  Attach bath  mats securely with double-sided non-slip rug tape.  Do not have throw rugs and other things on the floor that can make you trip. What can I do in the bedroom?  Use night lights.  Make sure that you have a light by your bed that is easy to reach.  Do not use any sheets or blankets that are too big for your bed. They should not hang down onto the floor.  Have a firm chair that has side arms. You can use this for support while you get dressed.  Do not have throw rugs and other things on the floor that can make you trip. What can I do in the kitchen?  Clean up any spills right away.  Avoid walking on wet floors.  Keep items that you use a lot in easy-to-reach places.  If you need to reach something above you, use a strong step stool that has a grab bar.  Keep electrical cords out of the way.  Do not use floor polish or wax that makes floors slippery. If you must use wax, use non-skid floor wax.  Do not have throw rugs and other things on the floor that can make you trip. What can I do with my stairs?  Do not leave any items on the stairs.  Make sure that there are handrails on both sides of the stairs and use them. Fix handrails that are broken or loose. Make sure that handrails are as long as the stairways.  Check any carpeting to make sure that it is firmly attached to the stairs. Fix any carpet that is loose or worn.  Avoid having throw rugs at the top or bottom of the stairs. If you do have throw rugs, attach them to the floor with carpet tape.  Make sure that you have a light switch at the top of the stairs and the bottom of the stairs. If you do not have them, ask someone to add them for you. What else can I do to help prevent falls?  Wear shoes that:  Do not have high heels.  Have rubber bottoms.  Are comfortable and fit you well.  Are closed at the toe. Do not wear sandals.  If you use a stepladder:  Make sure that it is fully opened. Do not climb a closed  stepladder.  Make sure that both sides of the stepladder are locked into place.  Ask someone to hold it for you, if possible.  Clearly mark and make sure that you can see:  Any grab bars or handrails.  First and last steps.  Where the edge of each step is.  Use tools that help you move around (mobility aids) if they are needed. These include:  Canes.  Walkers.  Scooters.  Crutches.  Turn on the lights when you go into a dark area. Replace any light bulbs as soon as they burn out.  Set up your furniture so you have a clear path. Avoid moving your furniture around.  If any of your floors are uneven, fix them.  If there are any pets around you, be aware of where they are.  Review your medicines with your doctor. Some medicines can make you feel dizzy. This can increase your chance of falling. Ask your doctor what other things that you can do to help prevent falls. This information is not intended to replace advice given to you by your health care provider. Make sure you discuss any questions you have with your health care provider. Document Released: 07/12/2009 Document Revised: 02/21/2016 Document Reviewed: 10/20/2014 Elsevier Interactive Patient Education  2017 [employer].

## 2020-08-13 NOTE — Assessment & Plan Note (Signed)
Pt has gained 3 lbs since last visit.  BMI is 29.57  Encouraged healthy diet, regular exercise.  Check labs to risk stratify.  Will follow.

## 2020-08-13 NOTE — Progress Notes (Signed)
   Subjective:    Patient ID: [name] Short, male    DOB: [birth year], 74 y.o.   MRN: [id number]  HPI Hyperlipidemia- chronic problem.  Currently on Fenofibrate 160mg  daily.  No abd pain, N/V  Afib- hx of paroxysmal afib.  On ASA 325mg  daily.  Currently asymptomatic.  No CP, palpitations, SOB, edema.  Overweight- pt has gained 3 lbs.  BMI is 29.57.  Plays tennis 1x/week, yard work.  Inguinal hernia- pt has hx of repair ~2011 w/ Dr .  Pt reports intermittent pain, L>R.  At this time, is able to easily reduce hernias to improve pain.     Review of Systems For ROS see HPI   This visit occurred during the SARS-CoV-2 public health emergency.  Safety protocols were in place, including screening questions prior to the visit, additional usage of staff PPE, and extensive cleaning of exam room while observing appropriate contact time as indicated for disinfecting solutions.       Objective:   Physical Exam Vitals reviewed.  Constitutional:      General: He is not in acute distress.    Appearance: Normal appearance. He is well-developed. He is not ill-appearing.  HENT:     Head: Normocephalic and atraumatic.  Eyes:     Conjunctiva/sclera: Conjunctivae normal.     Pupils: Pupils are equal, round, and reactive to light.  Neck:     Thyroid: No thyromegaly.  Cardiovascular:     Rate and Rhythm: Normal rate and regular rhythm.     Heart sounds: Normal heart sounds. No murmur heard.   Pulmonary:     Effort: Pulmonary effort is normal. No respiratory distress.     Breath sounds: Normal breath sounds.  Abdominal:     General: Bowel sounds are normal. There is no distension.     Palpations: Abdomen is soft.     Hernia: A hernia (bilateral inguinal hernias) is present.  Musculoskeletal:     Cervical back: Normal range of motion and neck supple.     Right lower leg: No edema.     Left lower leg: No edema.  Lymphadenopathy:     Cervical: No cervical adenopathy.  Skin:     General: Skin is warm and dry.  Neurological:     Mental Status: He is alert and oriented to person, place, and time.     Cranial Nerves: No cranial nerve deficit.  Psychiatric:        Behavior: Behavior normal.           Assessment & Plan:

## 2020-08-13 NOTE — Assessment & Plan Note (Signed)
Chronic problem.  Currently on Fenofibrate w/o difficulty.  Check labs.  Adjust meds prn

## 2020-08-13 NOTE — Patient Instructions (Signed)
Follow up in 6 months to recheck cholesterol We'll notify you of your lab results and make any changes if needed We'll call you with your Surgery Referral for the hernias Continue to work on healthy diet and regular exercise- you can do it! Schedule your COVID booster at your convenience Call with any questions or concerns Stay Safe!  Stay Healthy! Happy Holidays!

## 2020-08-15 ENCOUNTER — Ambulatory Visit: Payer: Medicare Other

## 2020-08-16 DIAGNOSIS — Z23 Encounter for immunization: Secondary | ICD-10-CM | POA: Diagnosis not present

## 2020-09-27 DIAGNOSIS — N400 Enlarged prostate without lower urinary tract symptoms: Secondary | ICD-10-CM | POA: Diagnosis not present

## 2020-09-27 DIAGNOSIS — N200 Calculus of kidney: Secondary | ICD-10-CM | POA: Diagnosis not present

## 2020-09-27 DIAGNOSIS — N5201 Erectile dysfunction due to arterial insufficiency: Secondary | ICD-10-CM | POA: Diagnosis not present

## 2020-10-01 ENCOUNTER — Other Ambulatory Visit: Payer: Self-pay | Admitting: Surgery

## 2020-10-13 ENCOUNTER — Other Ambulatory Visit (HOSPITAL_COMMUNITY)
Admission: RE | Admit: 2020-10-13 | Discharge: 2020-10-13 | Disposition: A | Discharge status: Home / Self Care | Payer: Medicare Other | Source: Ambulatory Visit | Attending: Surgery | Admitting: Surgery

## 2020-10-13 DIAGNOSIS — Z20822 Contact with and (suspected) exposure to covid-19: Secondary | ICD-10-CM | POA: Diagnosis not present

## 2020-10-13 DIAGNOSIS — Z01818 Encounter for other preprocedural examination: Secondary | ICD-10-CM | POA: Diagnosis not present

## 2020-10-13 LAB — SARS CORONAVIRUS 2 (TAT 6-24 HRS): SARS Coronavirus 2: NEGATIVE

## 2020-10-15 ENCOUNTER — Other Ambulatory Visit: Payer: Self-pay

## 2020-10-15 ENCOUNTER — Encounter (HOSPITAL_BASED_OUTPATIENT_CLINIC_OR_DEPARTMENT_OTHER): Payer: Self-pay | Admitting: Surgery

## 2020-10-15 NOTE — Progress Notes (Signed)
Spoke w/ via phone for pre-op interview--- PT Lab needs dos----EKG               Lab results------ no COVID test ------ done 10-13-2020 negative result in epic Arrive at ------- 0645 NPO after MN NO Solid Food.  Clear liquids from MN until--- 0545 Medications to take morning of surgery ----- Tagament Diabetic medication ----- n/a Patient Special Instructions ----- n/a Pre-Op special Istructions ----- n/a Patient verbalized understanding of instructions that were given at this phone interview. Patient denies shortness of breath, chest pain, fever, cough at this phone interview.

## 2020-10-15 NOTE — H&P (Signed)
[name] Short Appointment: 10/01/2020 11:30 AM Location: [facility] Patient #: [id number] DOB: Aug 11, 1946 Married / Language: [place] / Race: White Male   History of Present Illness (Che Below A. Magnus Ivan MD; 10/01/2020 11:50 AM) The patient is a 75 year old male who presents for an evaluation of a hernia. Chief complaint: Bilateral recurrent inguinal hernias  This is a 75 year old gentleman who I performed laparoscopic bilateral inguinal hernia repair with mesh in 2011 for direct hernias. Prior to that, he had had open bilateral inguinal hernia repairs by other surgeons. He reports he been doing well until spring of this year when he developed recurrent bulges. These of bowel both sides. He reports they're easily reducible and call some mild discomfort but no effective symptoms. He is otherwise without complaints and is currently been doing well other than intermittent kidney stones   Past Surgical History Zigmund Gottron Marshall-McBride, CMA; 10/01/2020 11:40 AM) Appendectomy  Hip Surgery  Bilateral. Laparoscopic Inguinal Hernia Surgery  Bilateral. Open Inguinal Hernia Surgery  Bilateral. Oral Surgery  Tonsillectomy   Diagnostic Studies History Zigmund Gottron Marshall-McBride, CMA; 10/01/2020 11:40 AM) Colonoscopy  >10 years ago 5-10 years ago  Allergies (Kheana Marshall-McBride, CMA; 10/01/2020 11:42 AM) Warfarin Sodium *ANTICOAGULANTS*  Allergies Reconciled   Medication History (Kheana Marshall-McBride, CMA; 10/01/2020 11:42 AM) Fenofibrate (160MG  Tablet, Oral) Active. Medications Reconciled  Social History , CMA; 10/01/2020 11:40 AM) Alcohol use  Moderate alcohol use, Occasional alcohol use. Caffeine use  Carbonated beverages, Coffee, Tea. No drug use  Tobacco use  Current some day smoker.  Family History 11/29/2020, CMA; 10/01/2020 11:40 AM) Alcohol Abuse  Brother. Arthritis  Father, Mother. Depression   Brother. Diabetes Mellitus  Sister. Heart Disease  Father. Hypertension  Father. Melanoma  Brother. Prostate Cancer  Father. Thyroid problems  Father.  Other Problems 11/29/2020 Marshall-McBride, CMA; 10/01/2020 11:40 AM) Arthritis  Atrial Fibrillation  Gastroesophageal Reflux Disease  Hypercholesterolemia  Inguinal Hernia  Kidney Stone     Review of Systems 11/29/2020 Marshall-McBride CMA; 10/01/2020 11:40 AM) General Not Present- Appetite Loss, Chills, Fatigue, Fever, Night Sweats, Weight Gain and Weight Loss. Skin Present- Dryness. Not Present- Change in Wart/Mole, Hives, Jaundice, New Lesions, Non-Healing Wounds, Rash and Ulcer. HEENT Present- Wears glasses/contact lenses. Not Present- Earache, Hearing Loss, Hoarseness, Nose Bleed, Oral Ulcers, Ringing in the Ears, Seasonal Allergies, Sinus Pain, Sore Throat, Visual Disturbances and Yellow Eyes. Respiratory Not Present- Bloody sputum, Chronic Cough, Difficulty Breathing, Snoring and Wheezing. Breast Not Present- Breast Mass, Breast Pain, Nipple Discharge and Skin Changes. Cardiovascular Not Present- Chest Pain, Difficulty Breathing Lying Down, Leg Cramps, Palpitations, Rapid Heart Rate, Shortness of Breath and Swelling of Extremities. Gastrointestinal Present- Excessive gas. Not Present- Abdominal Pain, Bloating, Bloody Stool, Change in Bowel Habits, Chronic diarrhea, Constipation, Difficulty Swallowing, Gets full quickly at meals, Hemorrhoids, Indigestion, Nausea, Rectal Pain and Vomiting. Male Genitourinary Not Present- Blood in Urine, Change in Urinary Stream, Frequency, Impotence, Nocturia, Painful Urination, Urgency and Urine Leakage. Musculoskeletal Present- Joint Pain. Not Present- Back Pain, Joint Stiffness, Muscle Pain, Muscle Weakness and Swelling of Extremities. Neurological Present- Numbness. Not Present- Decreased Memory, Fainting, Headaches, Seizures, Tingling, Tremor, Trouble walking and Weakness. Psychiatric Not  Present- Anxiety, Bipolar, Change in Sleep Pattern, Depression, Fearful and Frequent crying. Endocrine Not Present- Cold Intolerance, Excessive Hunger, Hair Changes, Heat Intolerance, Hot flashes and New Diabetes. Hematology Not Present- Blood Thinners, Easy Bruising, Excessive bleeding, Gland problems, HIV and Persistent Infections.  Vitals (Kheana Marshall-McBride CMA; 10/01/2020 11:43 AM) 10/01/2020 11:42 AM Weight: 182.13 lb  Height: 65in Body Surface Area: 1.9 m Body Mass Index: 30.31 kg/m  Temp.: 98.38F  Pulse: 108 (Regular)  P.OX: 96% (Room air) BP: 152/92(Sitting, Left Arm, Standard)       Physical Exam (Braxson Hollingsworth A. Magnus Ivan MD; 10/01/2020 11:51 AM) The physical exam findings are as follows: Note: He appears well on exam  His abdomen is soft and nontender. He does have bilaterally easily reducible inguinal hernias.    Assessment & Plan (Georgianna Band A. 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 Unfortunately again he has recurrent hernias and has fairly weak inguinal floor. He is symptomatic so open bilateral inguinal hernia repair with mesh is recommended. I again discussed this with him in detail. I discussed the surgical procedure in detail. I discussed the risks which includes but is not limited to bleeding, infection, injury to surrounding structures, use of mesh, nerve entrapment, chronic pain, hernia recurrence, postoperative recovery, etc. He understands and wishes to proceed with surgery

## 2020-10-15 NOTE — Anesthesia Preprocedure Evaluation (Addendum)
Anesthesia Evaluation  Patient identified by MRN, date of birth, ID band Patient awake    Reviewed: Allergy & Precautions, H&P , NPO status , Patient's Chart, lab work & pertinent test results  History of Anesthesia Complications (+) PROLONGED EMERGENCE and history of anesthetic complications (Hx of prolonged emergence, will avoid benzodiazepine )  Airway Mallampati: II  TM Distance: >3 FB Neck ROM: Full    Dental no notable dental hx. (+) Dental Advisory Given   Pulmonary Current Smoker and Patient abstained from smoking.,    Pulmonary exam normal        Cardiovascular Exercise Tolerance: Good Normal cardiovascular exam+ dysrhythmias Atrial Fibrillation      Neuro/Psych PSYCHIATRIC DISORDERS Anxiety negative neurological ROS     GI/Hepatic negative GI ROS, Neg liver ROS,   Endo/Other  negative endocrine ROS  Renal/GU Renal disease  negative genitourinary   Musculoskeletal  (+) Arthritis ,   Abdominal   Peds negative pediatric ROS (+)  Hematology negative hematology ROS (+)   Anesthesia Other Findings   Reproductive/Obstetrics negative OB ROS                            Anesthesia Physical  Anesthesia Plan  ASA: II  Anesthesia Plan: General   Post-op Pain Management:  Regional for Post-op pain   Induction: Intravenous  PONV Risk Score and Plan: 3 and Ondansetron, Dexamethasone and Treatment may vary due to age or medical condition  Airway Management Planned: LMA  Additional Equipment:   Intra-op Plan:   Post-operative Plan: Extubation in OR  Informed Consent: I have reviewed the patients History and Physical, chart, labs and discussed the procedure including the risks, benefits and alternatives for the proposed anesthesia with the patient or authorized representative who has indicated his/her understanding and acceptance.     Dental advisory given  Plan Discussed with:  CRNA and Anesthesiologist  Anesthesia Plan Comments:        Anesthesia Quick Evaluation

## 2020-10-16 ENCOUNTER — Ambulatory Visit (HOSPITAL_BASED_OUTPATIENT_CLINIC_OR_DEPARTMENT_OTHER): Payer: Medicare Other | Admitting: Anesthesiology

## 2020-10-16 ENCOUNTER — Ambulatory Visit (HOSPITAL_BASED_OUTPATIENT_CLINIC_OR_DEPARTMENT_OTHER)
Admission: RE | Admit: 2020-10-16 | Discharge: 2020-10-16 | Disposition: A | Discharge status: Home / Self Care | Payer: Medicare Other | Attending: Surgery | Admitting: Surgery

## 2020-10-16 ENCOUNTER — Encounter (HOSPITAL_BASED_OUTPATIENT_CLINIC_OR_DEPARTMENT_OTHER)
Admission: RE | Disposition: A | Discharge status: Home / Self Care | Payer: Self-pay | Source: Home / Self Care | Attending: Surgery

## 2020-10-16 ENCOUNTER — Encounter (HOSPITAL_BASED_OUTPATIENT_CLINIC_OR_DEPARTMENT_OTHER): Payer: Self-pay | Admitting: Surgery

## 2020-10-16 DIAGNOSIS — I480 Paroxysmal atrial fibrillation: Secondary | ICD-10-CM | POA: Diagnosis not present

## 2020-10-16 DIAGNOSIS — K4020 Bilateral inguinal hernia, without obstruction or gangrene, not specified as recurrent: Secondary | ICD-10-CM | POA: Insufficient documentation

## 2020-10-16 DIAGNOSIS — Z888 Allergy status to other drugs, medicaments and biological substances status: Secondary | ICD-10-CM | POA: Diagnosis not present

## 2020-10-16 DIAGNOSIS — K4021 Bilateral inguinal hernia, without obstruction or gangrene, recurrent: Secondary | ICD-10-CM | POA: Diagnosis not present

## 2020-10-16 DIAGNOSIS — E785 Hyperlipidemia, unspecified: Secondary | ICD-10-CM | POA: Diagnosis not present

## 2020-10-16 DIAGNOSIS — Z79899 Other long term (current) drug therapy: Secondary | ICD-10-CM | POA: Insufficient documentation

## 2020-10-16 DIAGNOSIS — G8918 Other acute postprocedural pain: Secondary | ICD-10-CM | POA: Diagnosis not present

## 2020-10-16 HISTORY — PX: INGUINAL HERNIA REPAIR: SHX194

## 2020-10-16 HISTORY — DX: Benign prostatic hyperplasia with lower urinary tract symptoms: N40.1

## 2020-10-16 HISTORY — DX: Male erectile dysfunction, unspecified: N52.9

## 2020-10-16 HISTORY — DX: Gastro-esophageal reflux disease without esophagitis: K21.9

## 2020-10-16 HISTORY — DX: Bilateral inguinal hernia, without obstruction or gangrene, not specified as recurrent: K40.20

## 2020-10-16 SURGERY — REPAIR, HERNIA, INGUINAL, ADULT
Anesthesia: General | Site: Groin | Laterality: Bilateral

## 2020-10-16 MED ORDER — CEFAZOLIN SODIUM-DEXTROSE 2-4 GM/100ML-% IV SOLN
2.0000 g | INTRAVENOUS | Status: AC
  Administered 2020-10-16: 2 g via INTRAVENOUS

## 2020-10-16 MED ORDER — CHLORHEXIDINE GLUCONATE CLOTH 2 % EX PADS: 6.0000 | MEDICATED_PAD | Freq: Once | CUTANEOUS | Status: DC

## 2020-10-16 MED ORDER — ACETAMINOPHEN 500 MG PO TABS
1000.0000 mg | ORAL_TABLET | ORAL | Status: AC
  Administered 2020-10-16: 1000 mg via ORAL

## 2020-10-16 MED ORDER — PROPOFOL 10 MG/ML IV BOLUS
INTRAVENOUS | Status: DC | PRN
  Administered 2020-10-16: 160 mg via INTRAVENOUS

## 2020-10-16 MED ORDER — AMISULPRIDE (ANTIEMETIC) 5 MG/2ML IV SOLN: 10.0000 mg | Freq: Once | INTRAVENOUS | Status: DC | PRN

## 2020-10-16 MED ORDER — LACTATED RINGERS IV SOLN: INTRAVENOUS | Status: DC

## 2020-10-16 MED ORDER — MIDAZOLAM HCL 2 MG/2ML IJ SOLN
1.0000 mg | Freq: Once | INTRAMUSCULAR | Status: AC
  Administered 2020-10-16: 1 mg via INTRAVENOUS

## 2020-10-16 MED ORDER — MIDAZOLAM HCL 2 MG/2ML IJ SOLN
INTRAMUSCULAR | Status: AC
  Filled 2020-10-16: qty 2

## 2020-10-16 MED ORDER — PROPOFOL 10 MG/ML IV BOLUS
INTRAVENOUS | Status: AC
  Filled 2020-10-16: qty 40

## 2020-10-16 MED ORDER — PHENYLEPHRINE 40 MCG/ML (10ML) SYRINGE FOR IV PUSH (FOR BLOOD PRESSURE SUPPORT)
PREFILLED_SYRINGE | INTRAVENOUS | Status: DC | PRN
  Administered 2020-10-16 (×2): 120 ug via INTRAVENOUS
  Administered 2020-10-16: 80 ug via INTRAVENOUS

## 2020-10-16 MED ORDER — EPHEDRINE SULFATE-NACL 50-0.9 MG/10ML-% IV SOSY
PREFILLED_SYRINGE | INTRAVENOUS | Status: DC | PRN
  Administered 2020-10-16: 15 mg via INTRAVENOUS

## 2020-10-16 MED ORDER — ONDANSETRON HCL 4 MG/2ML IJ SOLN
INTRAMUSCULAR | Status: AC
  Filled 2020-10-16: qty 2

## 2020-10-16 MED ORDER — ONDANSETRON HCL 4 MG/2ML IJ SOLN
INTRAMUSCULAR | Status: DC | PRN
  Administered 2020-10-16: 4 mg via INTRAVENOUS

## 2020-10-16 MED ORDER — DEXAMETHASONE SODIUM PHOSPHATE 10 MG/ML IJ SOLN
INTRAMUSCULAR | Status: AC
  Filled 2020-10-16: qty 1

## 2020-10-16 MED ORDER — DEXAMETHASONE SODIUM PHOSPHATE 10 MG/ML IJ SOLN
INTRAMUSCULAR | Status: DC | PRN
  Administered 2020-10-16: 5 mg via INTRAVENOUS

## 2020-10-16 MED ORDER — ACETAMINOPHEN 500 MG PO TABS: 1000.0000 mg | ORAL_TABLET | Freq: Once | ORAL | Status: DC

## 2020-10-16 MED ORDER — FENTANYL CITRATE (PF) 100 MCG/2ML IJ SOLN: 25.0000 ug | INTRAMUSCULAR | Status: DC | PRN

## 2020-10-16 MED ORDER — OXYCODONE HCL 5 MG PO TABS
5.0000 mg | ORAL_TABLET | Freq: Once | ORAL | Status: AC
Start: 2020-10-16 — End: 2020-10-16
  Administered 2020-10-16: 5 mg via ORAL

## 2020-10-16 MED ORDER — EPHEDRINE 5 MG/ML INJ
INTRAVENOUS | Status: AC
  Filled 2020-10-16: qty 10

## 2020-10-16 MED ORDER — LIDOCAINE HCL (PF) 2 % IJ SOLN
INTRAMUSCULAR | Status: AC
  Filled 2020-10-16: qty 5

## 2020-10-16 MED ORDER — PHENYLEPHRINE 40 MCG/ML (10ML) SYRINGE FOR IV PUSH (FOR BLOOD PRESSURE SUPPORT)
PREFILLED_SYRINGE | INTRAVENOUS | Status: AC
  Filled 2020-10-16: qty 10

## 2020-10-16 MED ORDER — ACETAMINOPHEN 500 MG PO TABS
ORAL_TABLET | ORAL | Status: AC
  Filled 2020-10-16: qty 2

## 2020-10-16 MED ORDER — FENTANYL CITRATE (PF) 100 MCG/2ML IJ SOLN
INTRAMUSCULAR | Status: AC
  Filled 2020-10-16: qty 2

## 2020-10-16 MED ORDER — BUPIVACAINE-EPINEPHRINE 0.5% -1:200000 IJ SOLN
INTRAMUSCULAR | Status: DC | PRN
  Administered 2020-10-16: 10 mL

## 2020-10-16 MED ORDER — CELECOXIB 200 MG PO CAPS
ORAL_CAPSULE | ORAL | Status: AC
  Filled 2020-10-16: qty 1

## 2020-10-16 MED ORDER — OXYCODONE HCL 5 MG PO TABS: 5.0000 mg | ORAL_TABLET | Freq: Four times a day (QID) | ORAL | 0 refills | Status: DC | PRN

## 2020-10-16 MED ORDER — BUPIVACAINE HCL (PF) 0.25 % IJ SOLN
INTRAMUSCULAR | Status: DC | PRN
  Administered 2020-10-16 (×2): 15 mL

## 2020-10-16 MED ORDER — CEFAZOLIN SODIUM-DEXTROSE 2-4 GM/100ML-% IV SOLN
INTRAVENOUS | Status: AC
  Filled 2020-10-16: qty 100

## 2020-10-16 MED ORDER — CELECOXIB 200 MG PO CAPS
200.0000 mg | ORAL_CAPSULE | Freq: Once | ORAL | Status: AC
  Administered 2020-10-16: 200 mg via ORAL

## 2020-10-16 MED ORDER — OXYCODONE HCL 5 MG PO TABS
ORAL_TABLET | ORAL | Status: AC
  Filled 2020-10-16: qty 1

## 2020-10-16 MED ORDER — ENSURE PRE-SURGERY PO LIQD: 296.0000 mL | Freq: Once | ORAL | Status: DC

## 2020-10-16 MED ORDER — FENTANYL CITRATE (PF) 100 MCG/2ML IJ SOLN
100.0000 ug | Freq: Once | INTRAMUSCULAR | Status: AC
  Administered 2020-10-16: 100 ug via INTRAVENOUS

## 2020-10-16 MED ORDER — BUPIVACAINE LIPOSOME 1.3 % IJ SUSP
INTRAMUSCULAR | Status: DC | PRN
  Administered 2020-10-16 (×2): 10 mL via PERINEURAL

## 2020-10-16 SURGICAL SUPPLY — 37 items
ADH SKN CLS APL DERMABOND .7 (GAUZE/BANDAGES/DRESSINGS) ×2
APL PRP STRL LF DISP 70% ISPRP (MISCELLANEOUS) ×1
BLADE CLIPPER SENSICLIP SURGIC (BLADE) ×1 IMPLANT
BLADE HEX COATED 2.75 (ELECTRODE) ×1 IMPLANT
BLADE SURG 10 STRL SS (BLADE) ×2 IMPLANT
BLADE SURG 15 STRL LF DISP TIS (BLADE) ×1 IMPLANT
BLADE SURG 15 STRL SS (BLADE) ×2
CHLORAPREP W/TINT 26 (MISCELLANEOUS) ×2 IMPLANT
COVER BACK TABLE 60X90IN (DRAPES) ×2 IMPLANT
COVER MAYO STAND STRL (DRAPES) ×2 IMPLANT
COVER WAND RF STERILE (DRAPES) ×2 IMPLANT
DERMABOND ADVANCED (GAUZE/BANDAGES/DRESSINGS) ×2
DERMABOND ADVANCED .7 DNX12 (GAUZE/BANDAGES/DRESSINGS) ×1 IMPLANT
DRAIN PENROSE 0.5X18 (DRAIN) ×1 IMPLANT
DRAPE LAPAROTOMY TRNSV 102X78 (DRAPES) ×2 IMPLANT
DRAPE UTILITY XL STRL (DRAPES) ×2 IMPLANT
ELECT REM PT RETURN 9FT ADLT (ELECTROSURGICAL) ×2
ELECTRODE REM PT RTRN 9FT ADLT (ELECTROSURGICAL) ×1 IMPLANT
GOWN STRL REUS W/ TWL XL LVL3 (GOWN DISPOSABLE) ×1 IMPLANT
GOWN STRL REUS W/TWL XL LVL3 (GOWN DISPOSABLE) ×2
KIT TURNOVER CYSTO (KITS) ×2 IMPLANT
MESH PARIETEX PROGRIP LEFT (Mesh General) ×1 IMPLANT
MESH PARIETEX PROGRIP RIGHT (Mesh General) ×1 IMPLANT
NDL HYPO 25X1 1.5 SAFETY (NEEDLE) ×1 IMPLANT
NEEDLE HYPO 25X1 1.5 SAFETY (NEEDLE) ×2 IMPLANT
NS IRRIG 500ML POUR BTL (IV SOLUTION) ×2 IMPLANT
PACK BASIN DAY SURGERY FS (CUSTOM PROCEDURE TRAY) ×2 IMPLANT
PENCIL SMOKE EVACUATOR (MISCELLANEOUS) ×2 IMPLANT
SPONGE LAP 4X18 RFD (DISPOSABLE) ×2 IMPLANT
SUT MNCRL AB 4-0 PS2 18 (SUTURE) ×3 IMPLANT
SUT SILK 2 0 SH (SUTURE) ×2 IMPLANT
SUT VIC AB 2-0 CT1 27 (SUTURE) ×8
SUT VIC AB 2-0 CT1 TAPERPNT 27 (SUTURE) ×1 IMPLANT
SUT VIC AB 3-0 CT1 27 (SUTURE) ×4
SUT VIC AB 3-0 CT1 TAPERPNT 27 (SUTURE) ×1 IMPLANT
SYR CONTROL 10ML LL (SYRINGE) ×2 IMPLANT
TOWEL OR 17X26 10 PK STRL BLUE (TOWEL DISPOSABLE) ×2 IMPLANT

## 2020-10-16 NOTE — Op Note (Signed)
Surgeon: Abigail Miyamoto, MD  Assistants: Vernard Gambles, MD -- resident  Anesthesia: General LMA anesthesia  ASA Class: 2  Operation: Bilateral open inguinal hernia repair with mesh   Indication: 77 M with prior bilateral open inguinal hernia repair in 1970s followed by lap bilateral IHR in 2011 for recurrence who presents with bilateral inguinal hernia recurrence for repair.   Findings: - Bilateral indirect inguinal herniae containing fat - Small direct component on the left  - Significant scar tissue in right groin and a small amount of scarring in left groin  Operative details: The patient was placed supine on the table and TAP blocks were performed by the anesthesia team. General anesthesia was induced via laryngeal mask airway. The abdomen and bilateral groins were prepped and draped. Following a timeout, we began by injecting local anesthetic in his bilateral groins.   Then, we made a 4 cm transverse incision on the right groin re-accessing his prior incision. The incision was deepened and we immediately encountered the hernia sac on the right without visualizing any significant external oblique muscle. We mobilized the hernia sac circumferentially and separated the cord structures from the hernia sac with blunt dissection. The cord structures were encircled with a Penrose drain. There was no significant inguinal ligament or conjoined tendon. The hernia sac was suture ligated at the base and divided just superior to the tie. The base of the hernia sac was reduced. A large-sized Prolene Progrip mesh was placed on the floor of the inguinal canal. A 2-0 Vicryl stitch was placed to anchor the mesh medially to the pubic tubercle. We also placed stitches on the mesh to reapproximate the ring. A running 2-0 Vicryl stitch was used to close the soft tissue overlying the mesh. Interrupted 2-0 Vicryl stitches were used to close Scarpa's fascia and the skin was closed with a running 4-0 Monocryl  stitch. Skin glue was applied.   We then turned to the left groin, where a 4 cm transverse incision was again made on the scar of his previous incision. Here, the external oblique muscle was found to be intact and was divided, revealing the hernia sac. The recurrence was primarily indirect but there was a small direct component as well. The hernia sac was mobilized and separated from the cord structures. The cord structures were encircled with a Penrose drain. There was no significant inguinal ligament or conjoined tendon. The hernia sac was suture ligated at the base and divided just superior to the tie. The base of the hernia sac was reduced. A large-sized Prolene Progrip mesh was placed on the floor of the inguinal canal. A 2-0 Vicryl stitch was placed to anchor the mesh medially to the pubic tubercle. We also placed stitches on the mesh to reapproximate the ring. A running 2-0 Vicryl stitch was used to close the external oblique overlying the mesh. Interrupted 2-0 Vicryl stitches were used to close Scarpa's fascia and the skin was closed with a running 4-0 Monocryl stitch. Skin glue was applied.   The patient was weaned from anesthesia and transferred to the recovery room.   Specimens: None  Complication: None

## 2020-10-16 NOTE — Anesthesia Postprocedure Evaluation (Signed)
Anesthesia Post Note  Patient: [name] Short  Procedure(s) Performed: BILATERAL OPEN INGUINAL HERNIA REPAIR WITH MESH (Bilateral Groin)     Patient location during evaluation: PACU Anesthesia Type: General Level of consciousness: sedated Pain management: pain level controlled Vital Signs Assessment: post-procedure vital signs reviewed and stable Respiratory status: spontaneous breathing and respiratory function stable Cardiovascular status: stable Postop Assessment: no apparent nausea or vomiting Anesthetic complications: no   No complications documented.  Last Vitals:  Vitals:   10/16/20 1045 10/16/20 1100  BP: 117/72 122/73  Pulse: 73 74  Resp: 12 10  Temp:    SpO2: 98% 94%    Last Pain:  Vitals:   10/16/20 1127  TempSrc:   PainSc: 4                  Kaicen Desena DANIEL

## 2020-10-16 NOTE — Anesthesia Procedure Notes (Signed)
Procedure Name: LMA Insertion Date/Time: 10/16/2020 8:51 AM Performed by: Briant Sites, CRNA Pre-anesthesia Checklist: Patient identified, Emergency Drugs available, Suction available and Patient being monitored Patient Re-evaluated:Patient Re-evaluated prior to induction Oxygen Delivery Method: Circle system utilized Preoxygenation: Pre-oxygenation with 100% oxygen Induction Type: IV induction Ventilation: Mask ventilation without difficulty LMA: LMA inserted LMA Size: 5.0 Number of attempts: 1 Airway Equipment and Method: Bite block Placement Confirmation: positive ETCO2 Tube secured with: Tape Dental Injury: Teeth and Oropharynx as per pre-operative assessment

## 2020-10-16 NOTE — Discharge Instructions (Signed)
Information for Discharge Teaching: EXPAREL (bupivacaine liposome injectable suspension)   Your surgeon or anesthesiologist gave you EXPAREL(bupivacaine) to help control your pain after surgery.   EXPAREL is a local anesthetic that provides pain relief by numbing the tissue around the surgical site.  EXPAREL is designed to release pain medication over time and can control pain for up to 72 hours.  Depending on how you respond to EXPAREL, you may require less pain medication during your recovery.  Possible side effects:  Temporary loss of sensation or ability to move in the area where bupivacaine was injected.  Nausea, vomiting, constipation  Rarely, numbness and tingling in your mouth or lips, lightheadedness, or anxiety may occur.  Call your doctor right away if you think you may be experiencing any of these sensations, or if you have other questions regarding possible side effects.  Follow all other discharge instructions given to you by your surgeon or nurse. Eat a healthy diet and drink plenty of water or other fluids.  If you return to the hospital for any reason within 96 hours following the administration of EXPAREL, it is important for health care providers to know that you have received this anesthetic. A teal colored band has been placed on your arm with the date, time and amount of EXPAREL you have received in order to alert and inform your health care providers. Please leave this armband in place for the full 96 hours following administration, and then you may remove the band.[facility] _______[facility]  UMBILICAL OR INGUINAL HERNIA REPAIR: POST OP INSTRUCTIONS  Always review your discharge instruction sheet given to you by the facility where your surgery was performed. IF YOU HAVE DISABILITY OR FAMILY LEAVE FORMS, YOU MUST BRING THEM TO THE OFFICE FOR PROCESSING.   DO NOT GIVE THEM TO YOUR DOCTOR.  1. A  prescription for pain medication may be given to you upon  discharge.  Take your pain medication as prescribed, if needed.  If narcotic pain medicine is not needed, then you may take acetaminophen (Tylenol) or ibuprofen (Advil) as needed. 2. Take your usually prescribed medications unless otherwise directed. If you need a refill on your pain medication, please contact your pharmacy.  They will contact our office to request authorization. Prescriptions will not be filled after 5 pm or on week-ends. 3. You should follow a light diet the first 24 hours after arrival home, such as soup and crackers, etc.  Be sure to include lots of fluids daily.  Resume your normal diet the day after surgery. 4.Most patients will experience some swelling and bruising around the umbilicus or in the groin and scrotum.  Ice packs and reclining will help.  Swelling and bruising can take several days to resolve.  6. It is common to experience some constipation if taking pain medication after surgery.  Increasing fluid intake and taking a stool softener (such as Colace) will usually help or prevent this problem from occurring.  A mild laxative (Milk of Magnesia or Miralax) should be taken according to package directions if there are no bowel movements after 48 hours. 7. Unless discharge instructions indicate otherwise, you may remove your bandages 24-48 hours after surgery, and you may shower at that time.  You may have steri-strips (small skin tapes) in place directly over the incision.  These strips should be left on the skin for 7-10 days.  If your surgeon used skin glue on the incision, you may shower in 24 hours.  The glue will  flake off over the next 2-3 weeks.  Any sutures or staples will be removed at the office during your follow-up visit. 8. ACTIVITIES:  You may resume regular (light) daily activities beginning the next day--such as daily self-care, walking, climbing stairs--gradually increasing activities as tolerated.  You may have sexual intercourse when it is comfortable.   Refrain from any heavy lifting or straining until approved by your doctor.  a.You may drive when you are no longer taking prescription pain medication, you can comfortably wear a seatbelt, and you can safely maneuver your car and apply brakes. b.RETURN TO WORK:   _____________________________________________  9.You should see your doctor in the office for a follow-up appointment approximately 2-3 weeks after your surgery.  Make sure that you call for this appointment within a day or two after you arrive home to insure a convenient appointment time. 10.OTHER INSTRUCTIONS: _OK TO SHOWER STARTING TOMORROW ICE PACK, TYLENOL, AND IBUPROFEN ALSO FOR PAIN NO LIFTING MORE THAN 15 POUNDS FOR 4 WEEKS________________________    _____________________________________  WHEN TO CALL YOUR DOCTOR: 1. Fever over 101.0 2. Inability to urinate 3. Nausea and/or vomiting 4. Extreme swelling or bruising 5. Continued bleeding from incision. 6. Increased pain, redness, or drainage from the incision  The clinic staff is available to answer your questions during regular business hours.  Please don't hesitate to call and ask to speak to one of the nurses for clinical concerns.  If you have a medical emergency, go to the nearest emergency room or call 911.  A surgeon from [facility] is always on call at the hospital   [address], [place], [place]  [address] ?  [address] [address], [place], [place]   [address] [phone] ? [phone] ? FAX [phone] Web site: [email or web address]       Do not take any Tylenol until after 1:45 pm today.  Post Anesthesia Home Care Instructions  Activity: Get plenty of rest for the remainder of the day. A responsible individual must stay with you for 24 hours following the procedure.  For the next 24 hours, DO NOT: -Drive a car -Advertising copywriter -Drink alcoholic beverages -Take any medication unless instructed by your  physician -Make any legal decisions or sign important papers.  Meals: Start with liquid foods such as gelatin or soup. Progress to regular foods as tolerated. Avoid greasy, spicy, heavy foods. If nausea and/or vomiting occur, drink only clear liquids until the nausea and/or vomiting subsides. Call your physician if vomiting continues.  Special Instructions/Symptoms: Your throat may feel dry or sore from the anesthesia or the breathing tube placed in your throat during surgery. If this causes discomfort, gargle with warm salt water. The discomfort should disappear within 24 hours.  If you had a scopolamine patch placed behind your ear for the management of post- operative nausea and/or vomiting:  1. The medication in the patch is effective for 72 hours, after which it should be removed.  Wrap patch in a tissue and discard in the trash. Wash hands thoroughly with soap and water. 2. You may remove the patch earlier than 72 hours if you experience unpleasant side effects which may include dry mouth, dizziness or visual disturbances. 3. Avoid touching the patch. Wash your hands with soap and water after contact with the patch.

## 2020-10-16 NOTE — Transfer of Care (Signed)
Immediate Anesthesia Transfer of Care Note  Patient: [name] Short  Procedure(s) Performed: BILATERAL OPEN INGUINAL HERNIA REPAIR WITH MESH (Bilateral Groin)  Patient Location: PACU  Anesthesia Type:General  Level of Consciousness: drowsy  Airway & Oxygen Therapy: Patient Spontanous Breathing and Patient connected to nasal cannula oxygen  Post-op Assessment: Report given to RN  Post vital signs: Reviewed and stable  Last Vitals:  Vitals Value Taken Time  BP 120/69 10/16/20 1015  Temp    Pulse 79 10/16/20 1017  Resp 14 10/16/20 1017  SpO2 96 % 10/16/20 1017  Vitals shown include unvalidated device data.  Last Pain:  Vitals:   10/16/20 0819  TempSrc: Oral  PainSc: 0-No pain         Complications: No complications documented.

## 2020-10-16 NOTE — Anesthesia Procedure Notes (Signed)
Anesthesia Regional Block: TAP block   Pre-Anesthetic Checklist: ,, timeout performed, Correct Patient, Correct Site, Correct Laterality, Correct Procedure, Correct Position, site marked, Risks and benefits discussed,  Surgical consent,  Pre-op evaluation,  At surgeon's request and post-op pain management  Laterality: Right  Prep: chloraprep       Needles:  Injection technique: Single-shot  Needle Type: Echogenic Stimulator Needle     Needle Length: 10cm  Needle Gauge: 21     Additional Needles:   Narrative:  Start time: 10/16/2020 8:04 AM End time: 10/16/2020 8:12 AM Injection made incrementally with aspirations every 5 mL.  Performed by: Personally

## 2020-10-16 NOTE — Interval H&P Note (Signed)
History and Physical Interval Note:no change in H and P  10/16/2020 8:31 AM  [name] Short  has presented today for surgery, with the diagnosis of BILATERAL RECURRENT INGUINAL HERNIA.  The various methods of treatment have been discussed with the patient and family. After consideration of risks, benefits and other options for treatment, the patient has consented to  Procedure(s) with comments: BILATERAL OPEN INGUINAL HERNIA REPAIR WITH MESH (Bilateral) - LMA/TAP BLOCK as a surgical intervention.  The patient's history has been reviewed, patient examined, no change in status, stable for surgery.  I have reviewed the patient's chart and labs.  Questions were answered to the patient's satisfaction.     Abigail Miyamoto

## 2020-10-16 NOTE — Anesthesia Procedure Notes (Signed)
Anesthesia Regional Block: TAP block   Pre-Anesthetic Checklist: ,, timeout performed, Correct Patient, Correct Site, Correct Laterality, Correct Procedure, Correct Position, site marked, Risks and benefits discussed,  Surgical consent,  Pre-op evaluation,  At surgeon's request and post-op pain management  Laterality: Left  Prep: chloraprep       Needles:  Injection technique: Single-shot  Needle Type: Echogenic Stimulator Needle     Needle Length: 10cm  Needle Gauge: 21     Additional Needles:   Narrative:  Start time: 10/16/2020 8:13 AM End time: 10/16/2020 8:19 AM Injection made incrementally with aspirations every 5 mL.  Performed by: Personally

## 2020-10-16 NOTE — Progress Notes (Signed)
Call to Dr. Magnus Ivan to request prescriptions be sent to Karin Golden at [facility] per spouses request.  He will send it in as soon as possible. Pharmacy changed in Epic to  [facility].

## 2020-10-16 NOTE — Progress Notes (Signed)
Assisted Dr. Singer with ultrasound guided, transabdominal plane block. Side rails up, monitors on throughout procedure. See vital signs in flow sheet. Tolerated Procedure well.  

## 2020-10-17 NOTE — Addendum Note (Signed)
Addendum  created 10/17/20 0736 by Briant Sites, CRNA   Charge Capture section accepted

## 2020-10-18 ENCOUNTER — Encounter (HOSPITAL_BASED_OUTPATIENT_CLINIC_OR_DEPARTMENT_OTHER): Payer: Self-pay | Admitting: Surgery

## 2020-10-19 NOTE — Addendum Note (Signed)
Addendum  created 10/19/20 9407 by Francie Massing, CRNA   Charge Capture section accepted

## 2020-10-21 ENCOUNTER — Emergency Department (HOSPITAL_COMMUNITY): Payer: Medicare Other

## 2020-10-21 ENCOUNTER — Other Ambulatory Visit: Payer: Self-pay

## 2020-10-21 ENCOUNTER — Inpatient Hospital Stay (HOSPITAL_COMMUNITY): Payer: Medicare Other

## 2020-10-21 ENCOUNTER — Inpatient Hospital Stay (HOSPITAL_COMMUNITY)
Admission: EM | Admit: 2020-10-21 | Discharge: 2020-10-23 | DRG: 066 | Disposition: A | Discharge status: Home / Self Care | Payer: Medicare Other | Attending: Internal Medicine | Admitting: Internal Medicine

## 2020-10-21 DIAGNOSIS — R471 Dysarthria and anarthria: Secondary | ICD-10-CM | POA: Diagnosis present

## 2020-10-21 DIAGNOSIS — R29810 Facial weakness: Secondary | ICD-10-CM | POA: Diagnosis not present

## 2020-10-21 DIAGNOSIS — N400 Enlarged prostate without lower urinary tract symptoms: Secondary | ICD-10-CM | POA: Diagnosis present

## 2020-10-21 DIAGNOSIS — I6340 Cerebral infarction due to embolism of unspecified cerebral artery: Principal | ICD-10-CM | POA: Diagnosis present

## 2020-10-21 DIAGNOSIS — M19039 Primary osteoarthritis, unspecified wrist: Secondary | ICD-10-CM | POA: Diagnosis present

## 2020-10-21 DIAGNOSIS — I480 Paroxysmal atrial fibrillation: Secondary | ICD-10-CM | POA: Diagnosis present

## 2020-10-21 DIAGNOSIS — G8324 Monoplegia of upper limb affecting left nondominant side: Secondary | ICD-10-CM | POA: Diagnosis present

## 2020-10-21 DIAGNOSIS — Z79899 Other long term (current) drug therapy: Secondary | ICD-10-CM

## 2020-10-21 DIAGNOSIS — K219 Gastro-esophageal reflux disease without esophagitis: Secondary | ICD-10-CM | POA: Diagnosis present

## 2020-10-21 DIAGNOSIS — E785 Hyperlipidemia, unspecified: Secondary | ICD-10-CM | POA: Diagnosis present

## 2020-10-21 DIAGNOSIS — Z7982 Long term (current) use of aspirin: Secondary | ICD-10-CM

## 2020-10-21 DIAGNOSIS — R29704 NIHSS score 4: Secondary | ICD-10-CM | POA: Diagnosis not present

## 2020-10-21 DIAGNOSIS — R29706 NIHSS score 6: Secondary | ICD-10-CM | POA: Diagnosis present

## 2020-10-21 DIAGNOSIS — Z20822 Contact with and (suspected) exposure to covid-19: Secondary | ICD-10-CM | POA: Diagnosis present

## 2020-10-21 DIAGNOSIS — M1000 Idiopathic gout, unspecified site: Secondary | ICD-10-CM | POA: Diagnosis not present

## 2020-10-21 DIAGNOSIS — N529 Male erectile dysfunction, unspecified: Secondary | ICD-10-CM | POA: Diagnosis present

## 2020-10-21 DIAGNOSIS — E783 Hyperchylomicronemia: Secondary | ICD-10-CM | POA: Diagnosis not present

## 2020-10-21 DIAGNOSIS — F17290 Nicotine dependence, other tobacco product, uncomplicated: Secondary | ICD-10-CM | POA: Diagnosis present

## 2020-10-21 DIAGNOSIS — Z818 Family history of other mental and behavioral disorders: Secondary | ICD-10-CM

## 2020-10-21 DIAGNOSIS — I4891 Unspecified atrial fibrillation: Secondary | ICD-10-CM | POA: Diagnosis not present

## 2020-10-21 DIAGNOSIS — R531 Weakness: Secondary | ICD-10-CM | POA: Diagnosis not present

## 2020-10-21 DIAGNOSIS — R29818 Other symptoms and signs involving the nervous system: Secondary | ICD-10-CM | POA: Diagnosis not present

## 2020-10-21 DIAGNOSIS — I63411 Cerebral infarction due to embolism of right middle cerebral artery: Secondary | ICD-10-CM | POA: Diagnosis not present

## 2020-10-21 DIAGNOSIS — M109 Gout, unspecified: Secondary | ICD-10-CM | POA: Diagnosis present

## 2020-10-21 DIAGNOSIS — Z87442 Personal history of urinary calculi: Secondary | ICD-10-CM | POA: Diagnosis not present

## 2020-10-21 DIAGNOSIS — R1310 Dysphagia, unspecified: Secondary | ICD-10-CM | POA: Diagnosis present

## 2020-10-21 DIAGNOSIS — I6602 Occlusion and stenosis of left middle cerebral artery: Secondary | ICD-10-CM | POA: Diagnosis not present

## 2020-10-21 DIAGNOSIS — Z888 Allergy status to other drugs, medicaments and biological substances status: Secondary | ICD-10-CM

## 2020-10-21 DIAGNOSIS — I639 Cerebral infarction, unspecified: Secondary | ICD-10-CM | POA: Diagnosis not present

## 2020-10-21 DIAGNOSIS — Z8261 Family history of arthritis: Secondary | ICD-10-CM

## 2020-10-21 DIAGNOSIS — R4781 Slurred speech: Secondary | ICD-10-CM | POA: Diagnosis not present

## 2020-10-21 DIAGNOSIS — I6782 Cerebral ischemia: Secondary | ICD-10-CM | POA: Diagnosis not present

## 2020-10-21 DIAGNOSIS — I6389 Other cerebral infarction: Secondary | ICD-10-CM | POA: Diagnosis not present

## 2020-10-21 LAB — CBC
HCT: 39.8 % (ref 39.0–52.0)
Hemoglobin: 12.8 g/dL — ABNORMAL LOW (ref 13.0–17.0)
MCH: 29.9 pg (ref 26.0–34.0)
MCHC: 32.2 g/dL (ref 30.0–36.0)
MCV: 93 fL (ref 80.0–100.0)
Platelets: 246 10*3/uL (ref 150–400)
RBC: 4.28 MIL/uL (ref 4.22–5.81)
RDW: 12.2 % (ref 11.5–15.5)
WBC: 10.8 10*3/uL — ABNORMAL HIGH (ref 4.0–10.5)
nRBC: 0 % (ref 0.0–0.2)

## 2020-10-21 LAB — I-STAT CHEM 8, ED
BUN: 25 mg/dL — ABNORMAL HIGH (ref 8–23)
Calcium, Ion: 1.19 mmol/L (ref 1.15–1.40)
Chloride: 106 mmol/L (ref 98–111)
Creatinine, Ser: 1.3 mg/dL — ABNORMAL HIGH (ref 0.61–1.24)
Glucose, Bld: 103 mg/dL — ABNORMAL HIGH (ref 70–99)
HCT: 39 % (ref 39.0–52.0)
Hemoglobin: 13.3 g/dL (ref 13.0–17.0)
Potassium: 3.9 mmol/L (ref 3.5–5.1)
Sodium: 142 mmol/L (ref 135–145)
TCO2: 24 mmol/L (ref 22–32)

## 2020-10-21 LAB — URINALYSIS, ROUTINE W REFLEX MICROSCOPIC
Bilirubin Urine: NEGATIVE
Glucose, UA: NEGATIVE mg/dL
Hgb urine dipstick: NEGATIVE
Ketones, ur: NEGATIVE mg/dL
Leukocytes,Ua: NEGATIVE
Nitrite: NEGATIVE
Protein, ur: NEGATIVE mg/dL
Specific Gravity, Urine: >1.046 — ABNORMAL HIGH (ref 1.005–1.030)
pH: 5 (ref 5.0–8.0)

## 2020-10-21 LAB — DIFFERENTIAL
Abs Immature Granulocytes: 0.03 10*3/uL (ref 0.00–0.07)
Basophils Absolute: 0.1 10*3/uL (ref 0.0–0.1)
Basophils Relative: 1 %
Eosinophils Absolute: 0.4 10*3/uL (ref 0.0–0.5)
Eosinophils Relative: 4 %
Immature Granulocytes: 0 %
Lymphocytes Relative: 20 %
Lymphs Abs: 2.2 10*3/uL (ref 0.7–4.0)
Monocytes Absolute: 1 10*3/uL (ref 0.1–1.0)
Monocytes Relative: 9 %
Neutro Abs: 7.1 10*3/uL (ref 1.7–7.7)
Neutrophils Relative %: 66 %

## 2020-10-21 LAB — RAPID URINE DRUG SCREEN, HOSP PERFORMED
Amphetamines: NOT DETECTED
Barbiturates: NOT DETECTED
Benzodiazepines: NOT DETECTED
Cocaine: NOT DETECTED
Opiates: NOT DETECTED
Tetrahydrocannabinol: NOT DETECTED

## 2020-10-21 LAB — COMPREHENSIVE METABOLIC PANEL
ALT: 18 U/L (ref 0–44)
AST: 24 U/L (ref 15–41)
Albumin: 3.5 g/dL (ref 3.5–5.0)
Alkaline Phosphatase: 45 U/L (ref 38–126)
Anion gap: 11 (ref 5–15)
BUN: 22 mg/dL (ref 8–23)
CO2: 25 mmol/L (ref 22–32)
Calcium: 9.4 mg/dL (ref 8.9–10.3)
Chloride: 106 mmol/L (ref 98–111)
Creatinine, Ser: 1.37 mg/dL — ABNORMAL HIGH (ref 0.61–1.24)
GFR, Estimated: 54 mL/min — ABNORMAL LOW (ref >60)
Glucose, Bld: 112 mg/dL — ABNORMAL HIGH (ref 70–99)
Potassium: 4.1 mmol/L (ref 3.5–5.1)
Sodium: 142 mmol/L (ref 135–145)
Total Bilirubin: 1 mg/dL (ref 0.3–1.2)
Total Protein: 7 g/dL (ref 6.5–8.1)

## 2020-10-21 LAB — HEMOGLOBIN A1C
Hgb A1c MFr Bld: 5.6 % (ref 4.8–5.6)
Mean Plasma Glucose: 114.02 mg/dL

## 2020-10-21 LAB — SARS CORONAVIRUS 2 BY RT PCR (HOSPITAL ORDER, PERFORMED IN ~~LOC~~ HOSPITAL LAB): SARS Coronavirus 2: NEGATIVE

## 2020-10-21 LAB — PROTIME-INR
INR: 1.1 (ref 0.8–1.2)
Prothrombin Time: 13.4 seconds (ref 11.4–15.2)

## 2020-10-21 LAB — ETHANOL: Alcohol, Ethyl (B): <10 mg/dL (ref <10)

## 2020-10-21 LAB — APTT: aPTT: 27 seconds (ref 24–36)

## 2020-10-21 MED ORDER — ENOXAPARIN SODIUM 40 MG/0.4ML ~~LOC~~ SOLN
40.0000 mg | SUBCUTANEOUS | Status: DC
  Administered 2020-10-21: 40 mg via SUBCUTANEOUS
  Filled 2020-10-21: qty 0.4

## 2020-10-21 MED ORDER — STROKE: EARLY STAGES OF RECOVERY BOOK
Freq: Once | Status: AC
  Filled 2020-10-21: qty 1

## 2020-10-21 MED ORDER — SENNOSIDES-DOCUSATE SODIUM 8.6-50 MG PO TABS
1.0000 | ORAL_TABLET | Freq: Every evening | ORAL | Status: DC | PRN
  Administered 2020-10-22: 1 via ORAL
  Filled 2020-10-21: qty 1

## 2020-10-21 MED ORDER — ACETAMINOPHEN 325 MG PO TABS
650.0000 mg | ORAL_TABLET | ORAL | Status: DC | PRN
  Administered 2020-10-22: 650 mg via ORAL
  Filled 2020-10-21: qty 2

## 2020-10-21 MED ORDER — IOHEXOL 350 MG/ML SOLN
100.0000 mL | Freq: Once | INTRAVENOUS | Status: AC | PRN
  Administered 2020-10-21: 100 mL via INTRAVENOUS

## 2020-10-21 MED ORDER — ASPIRIN EC 325 MG PO TBEC
325.0000 mg | DELAYED_RELEASE_TABLET | Freq: Every day | ORAL | Status: DC
  Administered 2020-10-21 – 2020-10-22 (×2): 325 mg via ORAL
  Filled 2020-10-21 (×2): qty 1

## 2020-10-21 MED ORDER — FENOFIBRATE 160 MG PO TABS
160.0000 mg | ORAL_TABLET | Freq: Every day | ORAL | Status: DC
  Administered 2020-10-22 – 2020-10-23 (×2): 160 mg via ORAL
  Filled 2020-10-21 (×3): qty 1

## 2020-10-21 MED ORDER — COLCHICINE 0.6 MG PO TABS
0.6000 mg | ORAL_TABLET | Freq: Every day | ORAL | Status: DC | PRN
Start: 2020-10-21 — End: 2020-10-23
  Administered 2020-10-22 – 2020-10-23 (×2): 0.6 mg via ORAL
  Filled 2020-10-21 (×3): qty 1

## 2020-10-21 MED ORDER — NAPROXEN 250 MG PO TABS
500.0000 mg | ORAL_TABLET | Freq: Two times a day (BID) | ORAL | Status: DC | PRN
  Filled 2020-10-21: qty 2

## 2020-10-21 MED ORDER — ACETAMINOPHEN 160 MG/5ML PO SOLN: 650.0000 mg | ORAL | Status: DC | PRN

## 2020-10-21 MED ORDER — SODIUM CHLORIDE 0.9 % IV BOLUS
500.0000 mL | Freq: Once | INTRAVENOUS | Status: AC
  Administered 2020-10-21: 500 mL via INTRAVENOUS

## 2020-10-21 MED ORDER — CLOPIDOGREL BISULFATE 75 MG PO TABS
75.0000 mg | ORAL_TABLET | Freq: Every day | ORAL | Status: DC
  Filled 2020-10-21: qty 1

## 2020-10-21 MED ORDER — SODIUM CHLORIDE 0.9 % IV SOLN: INTRAVENOUS | Status: DC

## 2020-10-21 MED ORDER — ROSUVASTATIN CALCIUM 20 MG PO TABS
40.0000 mg | ORAL_TABLET | Freq: Every day | ORAL | Status: DC
  Administered 2020-10-21 – 2020-10-22 (×2): 40 mg via ORAL
  Filled 2020-10-21 (×2): qty 2

## 2020-10-21 MED ORDER — ACETAMINOPHEN 650 MG RE SUPP: 650.0000 mg | RECTAL | Status: DC | PRN

## 2020-10-21 NOTE — ED Notes (Signed)
Attempted to give report x2 

## 2020-10-21 NOTE — Consult Note (Signed)
Neurology Consultation Reason for Consult: Stroke Referring Physician: Rosalia Hammers, D  CC: Left facial weakness  History is obtained from: Patient  HPI: [name] Short is a 75 y.o. male with a history of atrial fibrillation (not on anticoagulation), hyperlipidemia who presents with slurred speech and hand weakness that started on awakening from a nap around 11:30 PM.  He states that he laid down for a nap this morning around 8 AM and when he awoke around 11:30 PM, he noticed that he was able to use his left hand very well.  He went got his wife and told her that he thought he was having a stroke and she called 911 immediately, but unfortunately he was outside the tPA window on arrival to the emergency department.  CT angiogram was done which demonstrated that he did not have any large vessel occlusion.  CT perfusion demonstrates a small branch occlusion.   LKW: 8 AM tpa given?: no, outside of window   ROS: A 14 point ROS was performed and is negative except as noted in the HPI.   Past Medical History:  Diagnosis Date  . Arthritis    WRIST  . Benign localized prostatic hyperplasia with lower urinary tract symptoms (LUTS)   . Complication of anesthesia    slow to wake  . ED (erectile dysfunction)   . GERD (gastroesophageal reflux disease)   . History of concussion    AS CHILD--  NO RESIDUAL  . History of gout   . History of kidney stones   . Hyperlipidemia   . Inguinal hernia, bilateral   . Nephrolithiasis    BILATERAL  . PAF (paroxysmal atrial fibrillation) (HCC) currently being followed by pcp   EPISODE --  2011  FOLLOW-UP W/ DR WALL  /  HAS NOT SEEN ANY CARDIOLOGIST SINCE     Family History  Problem Relation Age of Onset  . Diabetes Mother   . Arthritis Mother   . Rheum arthritis Mother   . Dementia Mother   . Heart disease Father   . Prostate cancer Father   . Diabetes Sister   . Diabetes Maternal Grandmother   . Diabetes Maternal Grandfather   . Bipolar disorder  Brother   . Diabetes Brother   . Dementia Brother   . Neuropathy Brother      Social History:  reports that he has been smoking cigars. He has smoked for the past 15.00 years. He has never used smokeless tobacco. He reports previous alcohol use. He reports that he does not use drugs.   Exam: Current vital signs: BP 119/73   Pulse 73   Temp 97.7 F (36.5 C) (Oral)   Resp 17   SpO2 99%  Vital signs in last 24 hours: Temp:  [97.7 F (36.5 C)] 97.7 F (36.5 C) (01/23 1316) Pulse Rate:  [71-79] 73 (01/23 1445) Resp:  [17-32] 17 (01/23 1445) BP: (114-167)/(66-87) 119/73 (01/23 1445) SpO2:  [93 %-100 %] 99 % (01/23 1445)   Physical Exam  Constitutional: Appears well-developed and well-nourished.  Psych: Affect appropriate to situation Eyes: No scleral injection HENT: No OP obstrucion MSK: no joint deformities.  Cardiovascular: Normal rate and regular rhythm.  Respiratory: Effort normal, non-labored breathing GI: Soft.  No distension. There is no tenderness.  Skin: WDI  Neuro: Mental Status: Patient is awake, alert, oriented to person, place, month, year, and situation. Patient is able to give a clear and coherent history. No signs of aphasia or neglect Cranial Nerves: II: Visual Fields are  full. Pupils are equal, round, and reactive to light.   III,IV, VI: EOMI without ptosis or diploplia.  V: Facial sensation is symmetric to temperature VII: Facial movement is symmetric.  VIII: hearing is intact to voice X: Uvula elevates symmetrically XI: Shoulder shrug is symmetric. XII: tongue is midline without atrophy or fasciculations.  Motor: Tone is normal. Bulk is normal. 5/5 strength was present in bilateral legs and right arm, proximally he has good strength without drift in his left arm, but he has 1-2/5 distal strength in the left hand. Sensory: Sensation is diminished in the left arm Cerebellar: FNF consistent with weakness on the left, intact on the right, he does  have mild ataxia in the left leg   I have reviewed labs in epic and the results pertinent to this consultation are: Creatinine 1.37  I have reviewed the images obtained: CT A/P- no large vessel occlusion, there is an M3 branch that is blocked with perfusion scan showing a small completed infarct.  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  I strongly suspect this is embolic from atrial fibrillation and he will likely need to be on anticoagulation in the long-term.  In the short-term, would do aspirin therapy until MRI imaging and infarct burden/petechial hemorrhages are assessed for.  Recommendations: - HgbA1c, fasting lipid panel - MRI  of the brain without contrast - Frequent neuro checks - Echocardiogram - Prophylactic therapy-Antiplatelet med: Aspirin - dose 325mg  PO or 300mg  PR - Risk factor modification - Telemetry monitoring - PT consult, OT consult, Speech consult - Stroke team to follow  , MD [facility] [phone]  If 7pm- 7am, please page neurology on call as listed in AMION.

## 2020-10-21 NOTE — ED Notes (Signed)
Attempted to give report x3.

## 2020-10-21 NOTE — ED Provider Notes (Signed)
[facility] EMERGENCY DEPARTMENT Provider Note   CSN: [id number] Arrival date & time: 10/21/20  1240     History No chief complaint on file. Level five caveat Patient seen at bridge as code stroke  DAM ASHRAF is a 75 y.o. male.  HPI 75 year old male presents today via EMS with report of code stroke. LKN and at 745 this morning. Patient reports left facial droop, and left arm weakness.  She had recent bilateral hernia repair.  Review of records reveal EKG with A. fib noted EKG January 18.  Patient is not anticoagulated    Past Medical History:  Diagnosis Date  . Arthritis    WRIST  . Benign localized prostatic hyperplasia with lower urinary tract symptoms (LUTS)   . Complication of anesthesia    slow to wake  . ED (erectile dysfunction)   . GERD (gastroesophageal reflux disease)   . History of concussion    AS CHILD--  NO RESIDUAL  . History of gout   . History of kidney stones   . Hyperlipidemia   . Inguinal hernia, bilateral   . Nephrolithiasis    BILATERAL  . PAF (paroxysmal atrial fibrillation) (HCC) currently being followed by pcp   EPISODE --  2011  FOLLOW-UP W/ DR WALL  /  HAS NOT SEEN ANY CARDIOLOGIST SINCE    Patient Active Problem List   Diagnosis Date Noted  . Bilateral primary osteoarthritis of knee 02/15/2020  . Acquired trigger finger of right little finger 11/03/2019  . Encounter for orthopedic follow-up care 10/13/2019  . H/O total hip arthroplasty, bilateral 10/24/2018  . Gout 08/18/2017  . Overweight (BMI 25.0-29.9) 01/23/2017  . Foot pain, left 02/11/2016  . Sebaceous cyst 07/14/2014  . Sun-damaged skin 07/14/2014  . 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/14/2014  . Physical exam, annual 05/17/2012  . Inguinal hernia 08/12/2010  . 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 TOBACCO USER 05/01/2010  . Paroxysmal a-fib (HCC) 05/01/2010  . ABNORMAL ELECTROCARDIOGRAM 03/26/2010    Past Surgical History:  Procedure Laterality Date   . APPENDECTOMY  1983  . CARDIOVASCULAR STRESS TEST  05-08-2010  DR WALL   NO EVIDENCE OF SCAR OR ISCHEMIA/ EF 54%/  PT HAD BOTH AFIB/ AFLUTTER DURING STUDY  . CARPAL TUNNEL RELEASE Right 01/ 14/ 2021  . CYSTOSCOPY W/ URETERAL STENT PLACEMENT Left 09/20/2013   Procedure: CYSTOSCOPY WITH STENT REPLACEMENT;  Surgeon: Antony Haste, MD;  Location: [facility];  Service: Urology;  Laterality: Left;  . CYSTOSCOPY WITH URETEROSCOPY Left 09/20/2013   Procedure: CYSTOSCOPY WITH URETEROSCOPY;  Surgeon: Antony Haste, MD;  Location: [facility];  Service: Urology;  Laterality: Left;  . CYSTOSCOPY WITH URETEROSCOPY AND STENT PLACEMENT Left 07/12/2013   Procedure: CYSTOSCOPY WITH LITHOLAPEXY, LEFT URETERAL STENT PLACEMENT;  Surgeon: Antony Haste, MD;  Location: [facility];  Service: Urology;  Laterality: Left;  . CYSTOSCOPY WITH URETEROSCOPY AND STENT PLACEMENT Bilateral 06/23/2014   Procedure: BILATERAL URETEROSCOPY, HOLMIUM LASER LITHOTRIPSY AND STENT PLACEMENT, RIGHT ;  Surgeon: Jerilee Field, MD;  Location: [facility];  Service: Urology;  Laterality: Bilateral;  . EXTRACORPOREAL SHOCK WAVE LITHOTRIPSY  X2  . EXTRACORPOREAL SHOCK WAVE LITHOTRIPSY Left 08-29-2013;   07-28-2013  . EXTRACORPOREAL SHOCK WAVE LITHOTRIPSY Right 12/09/2018   Procedure: EXTRACORPOREAL SHOCK WAVE LITHOTRIPSY (ESWL);  Surgeon: Jerilee Field, MD;  Location: [facility];  Service: Urology;  Laterality: Right;  . HOLMIUM LASER APPLICATION Left 09/20/2013   Procedure: HOLMIUM LASER  APPLICATION;  Surgeon: Antony Haste, MD;  Location: [facility];  Service: Urology;  Laterality: Left;  . INGUINAL HERNIA REPAIR Bilateral 10/16/2020   Procedure: BILATERAL OPEN INGUINAL HERNIA REPAIR WITH MESH;  Surgeon: Abigail Miyamoto, MD;  Location: [facility];  Service: General;  Laterality: Bilateral;  LMA/TAP BLOCK   . LAPAROSCOPIC INGUINAL HERNIA REPAIR Bilateral 09-04-2010   w/ mesh  . TONSILLECTOMY  AS CHILD  . 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 TRANSTHORACIC ECHOCARDIOGRAM  05-08-2010   NORMAL LVSF/  EF 55%/  GRADE I DIASTOLIC DYSFUNCTION/  MILD BILATERAL ATRIUM ENLARGEMENT       Family History  Problem Relation Age of Onset  . Diabetes Mother   . Arthritis Mother   . Rheum arthritis Mother   . Dementia Mother   . Heart disease Father   . Prostate cancer Father   . Diabetes Sister   . Diabetes Maternal Grandmother   . Diabetes Maternal Grandfather   . Bipolar disorder Brother   . Diabetes Brother   . Dementia Brother   . Neuropathy Brother     Social History   Tobacco Use  . Smoking status: Light Tobacco Smoker    Years: 15.00    Types: Cigars  . Smokeless tobacco: Never Used  . Tobacco comment: AVERAGE 3 CIGARS PER WEEK  Vaping Use  . Vaping Use: Never used  Substance Use Topics  . Alcohol use: Not Currently    Comment: occasional  . Drug use: No    Home Medications Prior to Admission medications   Medication Sig Start Date End Date Taking? Authorizing Provider  aspirin 325 MG EC tablet Take 1 tablet (325 mg total) by mouth daily. 12/11/18   Jerilee Field, MD  calcium carbonate (TUMS - DOSED IN MG ELEMENTAL CALCIUM) 500 MG chewable tablet Chew 1 tablet by mouth as needed for indigestion or heartburn.    [provider]  Calcium Carbonate-Vitamin D (CALCIUM 600 + D PO) Take 1 tablet by mouth daily.    [provider]  Cimetidine (TAGAMET PO) Take by mouth daily as needed.    [provider]  Colchicine 0.6 MG CAPS Take by mouth daily as needed.    [provider]  fenofibrate 160 MG tablet TAKE 1 TABLET BY  MOUTH  DAILY 06/05/20   Sheliah Hatch, MD  ibuprofen (ADVIL,MOTRIN) 200 MG tablet Take 2 tablets (400 mg total) by mouth 3 (three) times daily as needed for mild pain or moderate pain. For pain 12/11/18   Jerilee Field, MD  Multiple Vitamin (MULTIVITAMIN WITH MINERALS) TABS Take 1 tablet by mouth daily.    [provider]  naproxen sodium (ALEVE) 220 MG tablet Take 2 tablets (440 mg total) by mouth 2 (two) times daily as needed (pain). For pain 12/11/18   Jerilee Field, MD  oxyCODONE (OXY IR/ROXICODONE) 5 MG immediate release tablet Take 1 tablet (5 mg total) by mouth every 6 (six) hours as needed for moderate pain or severe pain. 10/16/20   Abigail Miyamoto, MD  Red Yeast Rice 600 MG TABS Take by mouth daily.    [provider]  tadalafil (CIALIS) 5 MG tablet Take 5 mg by mouth daily as needed for erectile dysfunction.    [provider]  tamsulosin (FLOMAX) 0.4 MG CAPS capsule 0.4 mg daily.  Patient not taking: No sig reported 10/11/16   [provider]    Allergies    Warfarin sodium  Review of Systems   Review of Systems  Unable to perform ROS: Acuity of condition  All other systems reviewed and are negative.   Physical Exam Updated Vital Signs There were no vitals taken for this visit.  Physical Exam Vitals reviewed.  Constitutional:      Appearance: Normal appearance.  HENT:     Head: Normocephalic.     Right Ear: External ear normal.     Left Ear: External ear normal.     Nose: Nose normal.     Mouth/Throat:     Pharynx: Oropharynx is clear.  Eyes:     Pupils: Pupils are equal, round, and reactive to light.  Cardiovascular:     Rate and Rhythm: Normal rate and regular rhythm.  Pulmonary:     Effort: Pulmonary effort is normal.     Breath sounds: Normal breath sounds.  Abdominal:     General: Abdomen is flat.     Palpations: Abdomen is soft.  Genitourinary:    Comments: Well-healing incisions bilateral groin consistent  with recent bilateral hernia repair Femoral pulses palpated and normal bilaterally Musculoskeletal:        General: Normal range of motion.     Cervical back: Normal range of motion.  Skin:    General: Skin is warm.  Neurological:     Mental Status: He is alert.     Cranial Nerves: Cranial nerve deficit present.     Sensory: Sensory deficit present.     Motor: Weakness present.     Coordination: Coordination normal.     Deep Tendon Reflexes: Reflexes normal.     Comments: Left arm drift No visual field cut Eyes deviated to right test inability to move past midline towards left No leg drift Decreased sensation left arm  Psychiatric:        Mood and Affect: Mood normal.     ED Results / Procedures / Treatments   Labs (all labs ordered are listed, but only abnormal results are displayed) Labs Reviewed  ETHANOL  PROTIME-INR  APTT  CBC  DIFFERENTIAL  COMPREHENSIVE METABOLIC PANEL  RAPID URINE DRUG SCREEN, HOSP PERFORMED  URINALYSIS, ROUTINE W REFLEX MICROSCOPIC  I-STAT CHEM 8, ED    EKG ED ECG REPORT   Date: 10/21/2020  Rate: 70  Rhythm: normal sinus rhythm  QRS Axis: normal  Intervals: normal  ST/T Wave abnormalities: normal  Conduction Disutrbances:none  Narrative Interpretation:   Old EKG Reviewed: changes noted  I have personally reviewed the EKG tracing and agree with the computerized printout as noted.  Radiology CT HEAD CODE STROKE WO CONTRAST  Result Date: 10/21/2020 CLINICAL DATA:  Code stroke.  Neuro deficit, acute, stroke suspected EXAM: CT HEAD WITHOUT CONTRAST TECHNIQUE: Contiguous axial images were obtained from the base of the skull through the vertex without intravenous contrast. COMPARISON:  None. FINDINGS: Brain: No intracranial hemorrhage. Ill-defined hypodense region involving the right frontal lobe (2:22). No mass lesion. No midline shift, ventriculomegaly or extra-axial fluid collection. Mild chronic microvascular ischemic changes. Vascular:  No hyperdense vessel or unexpected calcification. Bilateral skull base atherosclerotic calcifications. Skull: No acute finding. Sinuses/Orbits: No acute orbital finding. Pneumatized paranasal sinuses and mastoid air cells. Other: None. ASPECTS [facility] Stroke Program Early CT Score) - Ganglionic level infarction (caudate, lentiform nuclei, internal capsule, insula, M1-M3 cortex):  7 - Supraganglionic infarction (M4-M6 cortex): 2 Total score (0-10 with 10 being normal): 9 IMPRESSION: 1. Ill-defined right frontal hypodensity concerning for acute infarct. No intracranial hemorrhage. 2. ASPECTS is 9 Code stroke imaging results were communicated on 10/21/2020 at 1:04 pm to provider Dr. Amada JupiterKirkpatrick via secure text paging. Electronically Signed   By: Stana Buntinghikanele  Emekauwa M.D.   On: 10/21/2020 13:05    Procedures .Critical Care Performed by: Margarita Grizzleay, Danielle, MD Authorized by: Margarita Grizzleay, Danielle, MD   Critical care provider statement:    Critical care time (minutes):  45   Critical care end time:  10/21/2020 1:55 PM   Critical care was necessary to treat or prevent imminent or life-threatening deterioration of the following conditions:  CNS failure or compromise   Critical care was time spent personally by me on the following activities:  Discussions with consultants, evaluation of patient's response to treatment, examination of patient, ordering and performing treatments and interventions, ordering and review of laboratory studies, ordering and review of radiographic studies, pulse oximetry, re-evaluation of patient's condition, obtaining history from patient or surrogate and review of old charts   (including critical care time)  Medications Ordered in ED Medications - No data to display  ED Course  I have reviewed the triage vital signs and the nursing notes.  Pertinent labs & imaging results that were available during my care of the patient were reviewed by me and considered in my medical decision making (see  chart for details).    MDM Rules/Calculators/A&P                          Discussed with Dr. Amada JupiterKirkpatrick and states subacute stroke- patient can be admitted to medicine.  Patient's symptoms began at 9 AM and he is outside of tPA window Consult to hospitalist placed Patient with exam unchanged BP 10/84 Discussed care with Dr. Debby BudNorins who will see for admission Final Clinical Impression(s) / ED Diagnoses Final diagnoses:  Cerebrovascular accident (CVA), unspecified mechanism [facility](HCC)    Rx / DC Orders ED Discharge Orders    None       Margarita Grizzleay, Danielle, MD 10/21/20 1357

## 2020-10-21 NOTE — Code Documentation (Signed)
Stroke Response Nurse Documentation Code Documentation  MAXAMILLION BANAS is a 75 y.o. male arriving to [facility] ED via [facility] on 10/21/2020 with past medical hx of recent bilateral inguinal hernia repair, paroxysmal AF. Code stroke was activated by EMS. Patient from home where he was LKW at 38. Pt states that he woke up at 0745 to take his pain medication and was in his normal state of health. He went back to sleep and when he woke up at around 1145 he was weak on his left side, had slurred speech and confusion. On No antithrombotic. Stroke team at the bedside on patient arrival. Labs drawn and patient cleared for CT by Dr. Rosalia Hammers. Patient to CT with team. NIHSS 6, see documentation for details and code stroke times. Patient with right gaze preference , left facial droop, left limb ataxia, left decreased sensation, dysarthria  and Visual  neglect on exam. The following imaging was completed: CT, CTA head and neck, CTP. Patient is not a candidate for tPA due to LKW of 0745.  Care/Plan: q2h VS and NIHSS Bedside handoff with Harvel Quale, RN  Ree Kida Naisha Wisdom  Rapid Response RN

## 2020-10-21 NOTE — ED Notes (Signed)
Attempted to give report x1. Charge Nurse stated unable to approve bed request because a new covid swab has not been collected and resulted. Last covid swab was on 10/13/2020 at a [place] facility results were negative.

## 2020-10-21 NOTE — ED Triage Notes (Signed)
Pt arrived from home complaining of slurred speech and left hand weakness.   Pt last known normal was 0745.  Pt noticed symptoms at 1145.  Code Stroke called prior to arrival

## 2020-10-21 NOTE — H&P (Signed)
History and Physical    [name] IhaRichard I Pavlicek VHQ:[id number] DOB: [birth year] DOA: 10/21/2020  PCP: Sheliah Hatchabori, Katherine E, MD  Patient coming from: home  I have personally briefly reviewed patient's [facility]  Chief Complaint: left facial droop and left arm weakness.  HPI: [name] Short is a 75 y.o. male with medical history significant of HLD, gout, OA, BPH, GERD. He had bilateral inquinal hernia repair 10/16/20.  EKG 10/16/20 revealed A. Fib. Patient was not anticoagulated. This AM he awoke at approximately 7:45 and noted left facial droop and left arm weakness after morning nap. Wife reports she noticed symptoms at 11:30 AM. He was brought to [facility] for evaluation.   ED Course: T 97.7  118/66  HR 71  RR 20. Code Stroke initiated: Headt CT with right fronatl hypointensity suggestive of acute infarct. CTA head/neck revealed right M3 branch occlusion, CT perfusion head c/w acute right frontal infarct. Dr. Amada JupiterKirkpatrick was consulted for neurology. Patient outside time frame for TPA. He recommended [facility] admit patient for stroke treatment.   Review of Systems: As per HPI otherwise 10 point review of systems negative.    Past Medical History:  Diagnosis Date  . Arthritis    WRIST  . Benign localized prostatic hyperplasia with lower urinary tract symptoms (LUTS)   . Complication of anesthesia    slow to wake  . ED (erectile dysfunction)   . GERD (gastroesophageal reflux disease)   . History of concussion    AS CHILD--  NO RESIDUAL  . History of gout   . History of kidney stones   . Hyperlipidemia   . Inguinal hernia, bilateral   . Nephrolithiasis    BILATERAL  . PAF (paroxysmal atrial fibrillation) (HCC) currently being followed by pcp   EPISODE --  2011  FOLLOW-UP W/ DR WALL  /  HAS NOT SEEN ANY CARDIOLOGIST SINCE    Past Surgical History:  Procedure Laterality Date  . APPENDECTOMY  1983  . CARDIOVASCULAR STRESS TEST  05-08-2010  DR WALL   NO EVIDENCE  OF SCAR OR ISCHEMIA/ EF 54%/  PT HAD BOTH AFIB/ AFLUTTER DURING STUDY  . CARPAL TUNNEL RELEASE Right 01/ 14/ 2021  . CYSTOSCOPY W/ URETERAL STENT PLACEMENT Left 09/20/2013   Procedure: CYSTOSCOPY WITH STENT REPLACEMENT;  Surgeon: Antony HasteMatthew Ramsey Eskridge, MD;  [facility] [place];  Service: Urology;  Laterality: Left;  . CYSTOSCOPY WITH URETEROSCOPY Left 09/20/2013   Procedure: CYSTOSCOPY WITH URETEROSCOPY;  Surgeon: Antony HasteMatthew Ramsey Eskridge, MD;  [facility] [place];  Service: Urology;  Laterality: Left;  . CYSTOSCOPY WITH URETEROSCOPY AND STENT PLACEMENT Left 07/12/2013   Procedure: CYSTOSCOPY WITH LITHOLAPEXY, LEFT URETERAL STENT PLACEMENT;  Surgeon: Antony HasteMatthew Ramsey Eskridge, MD;  [facility] [place];  Service: Urology;  Laterality: Left;  . CYSTOSCOPY WITH URETEROSCOPY AND STENT PLACEMENT Bilateral 06/23/2014   Procedure: BILATERAL URETEROSCOPY, HOLMIUM LASER LITHOTRIPSY AND STENT PLACEMENT, RIGHT ;  Surgeon: Jerilee FieldMatthew Eskridge, MD;  [facility] [place];  Service: Urology;  Laterality: Bilateral;  . EXTRACORPOREAL SHOCK WAVE LITHOTRIPSY  X2  . EXTRACORPOREAL SHOCK WAVE LITHOTRIPSY Left 08-29-2013;   07-28-2013  . EXTRACORPOREAL SHOCK WAVE LITHOTRIPSY Right 12/09/2018   Procedure: EXTRACORPOREAL SHOCK WAVE LITHOTRIPSY (ESWL);  Surgeon: Jerilee FieldEskridge, Matthew, MD;  Location: [facility];  Service: Urology;  Laterality: Right;  . HOLMIUM LASER APPLICATION Left 09/20/2013   Procedure: HOLMIUM LASER APPLICATION;  Surgeon: Antony HasteMatthew Ramsey Eskridge, MD;  [facility] [place];  Service: Urology;  Laterality: Left;  . INGUINAL HERNIA REPAIR Bilateral 10/16/2020   Procedure: BILATERAL OPEN INGUINAL HERNIA REPAIR WITH MESH;  Surgeon: Abigail MiyamotoBlackman, Douglas, MD;  [facility] [place];  Service: General;  Laterality: Bilateral;  LMA/TAP BLOCK  . LAPAROSCOPIC INGUINAL HERNIA REPAIR Bilateral 09-04-2010   w/ mesh  . TONSILLECTOMY   AS CHILD  . 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 TOTAL HIP ARTHROPLASTY Right 11/15/2012   Procedure: RIGHT TOTAL HIP ARTHROPLASTY ANTERIOR APPROACH;  Surgeon: Eldred MangesMark C Yates, MD;  Location: [facility];  Service: Orthopedics;  Laterality: Right;  Right total hip arthroplasty  . TRANSTHORACIC ECHOCARDIOGRAM  05-08-2010   NORMAL LVSF/  EF 55%/  GRADE I DIASTOLIC DYSFUNCTION/  MILD BILATERAL ATRIUM ENLARGEMENT    Soc Hx - Married. Two daughters: 1 is an interventional caardiologist at [employer], 1 is Dr. Audiology. Lives with wife, I-ADLs. 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   reports that he has been smoking cigars. He has smoked for the past 15.00 years. He has never used smokeless tobacco. He reports previous alcohol use. He reports that he does not use drugs.  Allergies  Allergen Reactions  . Warfarin Sodium Rash    Family History  Problem Relation Age of Onset  . Diabetes Mother   . Arthritis Mother   . Rheum arthritis Mother   . Dementia Mother   . Heart disease Father   . Prostate cancer Father   . Diabetes Sister   . Diabetes Maternal Grandmother   . Diabetes Maternal Grandfather   . Bipolar disorder Brother   . Diabetes Brother   . Dementia Brother   . Neuropathy Brother      Prior to Admission medications   Medication Sig Start Date End Date Taking? Authorizing Provider  aspirin 325 MG EC tablet Take 1 tablet (325 mg total) by mouth daily. 12/11/18   Jerilee FieldEskridge, Matthew, MD  calcium carbonate (TUMS - DOSED IN MG ELEMENTAL CALCIUM) 500 MG chewable tablet Chew 1 tablet by mouth as needed for indigestion or heartburn.    [provider]  Calcium Carbonate-Vitamin D (CALCIUM 600 + D PO) Take 1 tablet by mouth daily.    [provider]  Cimetidine (TAGAMET PO) Take by mouth daily as needed.    [provider]  Colchicine 0.6 MG CAPS Take by mouth daily as needed.    [provider]  fenofibrate  160 MG tablet TAKE 1 TABLET BY MOUTH  DAILY 06/05/20   Sheliah Hatchabori, Katherine E, MD  ibuprofen (ADVIL,MOTRIN) 200 MG tablet Take 2 tablets (400 mg total) by mouth 3 (three) times daily as needed for mild pain or moderate pain. For pain 12/11/18   Jerilee FieldEskridge, Matthew, MD  Multiple Vitamin (MULTIVITAMIN WITH MINERALS) TABS Take 1 tablet by mouth daily.    [provider]  naproxen sodium (ALEVE) 220 MG tablet Take 2 tablets (440 mg total) by mouth 2 (two) times daily as needed (pain). For pain 12/11/18   Jerilee FieldEskridge, Matthew, MD  oxyCODONE (OXY IR/ROXICODONE) 5 MG immediate release tablet Take 1 tablet (5 mg total) by mouth every 6 (six) hours as needed for moderate pain or severe pain. 10/16/20   Abigail MiyamotoBlackman, Douglas, MD  Red Yeast Rice 600 MG TABS Take by mouth daily.    [provider]  tadalafil (CIALIS) 5 MG tablet Take 5 mg by mouth daily as needed for erectile dysfunction.    [provider]  tamsulosin (FLOMAX) 0.4  MG CAPS capsule 0.4 mg daily.  Patient not taking: No sig reported 10/11/16   [provider]    Physical Exam: Vitals:   10/21/20 1316 10/21/20 1345 10/21/20 1400 10/21/20 1415  BP: (!) 167/78 114/84 126/76 118/66  Pulse: 78 76 79 71  Resp: (!) 22 (!) 31 (!) 32 20  Temp: 97.7 F (36.5 C)     TempSrc: Oral     SpO2: 99% 99% 100% 93%     Vitals:   10/21/20 1316 10/21/20 1345 10/21/20 1400 10/21/20 1415  BP: (!) 167/78 114/84 126/76 118/66  Pulse: 78 76 79 71  Resp: (!) 22 (!) 31 (!) 32 20  Temp: 97.7 F (36.5 C)     TempSrc: Oral     SpO2: 99% 99% 100% 93%   General:  WNWD man in no distress. Maintains sense of humor Eyes: PERRL, lids and conjunctivae normal ENMT: Mucous membranes are moist. Normal dentition.  Neck: normal, supple, no masses, no thyromegaly Respiratory: clear to auscultation bilaterally, no wheezing, no crackles. Normal respiratory effort. No accessory muscle use.  Cardiovascular: Regular rate and rhythm, no murmurs / rubs /  gallops. No extremity edema. 2+ pedal pulses. No carotid bruits.  Abdomen: no tenderness, no masses palpated. No hepatosplenomegaly. Bowel sounds positive. Inquinal surgical wounds appear closed w/o drainage, minimal erythema. Scrotum - with eccymotic appears from dissecting blood from above.   Musculoskeletal: no clubbing / cyanosis. No joint deformity upper and lower extremities. Good ROM, no contractures. Normal muscle tone.  Skin: no lesions, ulcers. No induration. Chronic macular-papular diffuse rash. Neurologic: CN 2-12 : left facial droop, decreased adduction left eye, thickened speech, nl shoulder shrug. MS - 5/5 R grip, UE, LE; 5.5 proximal left UE strength, 1/5 grip left hand, 5/5 proximal and distal LE strength. Psychiatric: Normal judgment and insight. Alert and oriented x 3. Normal mood.     Labs on Admission: I have personally reviewed following labs and imaging studies  CBC: Recent Labs  Lab 10/21/20 1245 10/21/20 1251  WBC 10.8*  --   NEUTROABS 7.1  --   HGB 12.8* 13.3  HCT 39.8 39.0  MCV 93.0  --   PLT 246  --    Basic Metabolic Panel: Recent Labs  Lab 10/21/20 1245 10/21/20 1251  NA 142 142  K 4.1 3.9  CL 106 106  CO2 25  --   GLUCOSE 112* 103*  BUN 22 25*  CREATININE 1.37* 1.30*  CALCIUM 9.4  --    GFR: Estimated Creatinine Clearance: 51.8 mL/min (A) (by C-G formula based on SCr of 1.3 mg/dL (H)). Liver Function Tests: Recent Labs  Lab 10/21/20 1245  AST 24  ALT 18  ALKPHOS 45  BILITOT 1.0  PROT 7.0  ALBUMIN 3.5   No results for input(s): LIPASE, AMYLASE in the last 168 hours. No results for input(s): AMMONIA in the last 168 hours. Coagulation Profile: Recent Labs  Lab 10/21/20 1245  INR 1.1   Cardiac Enzymes: No results for input(s): CKTOTAL, CKMB, CKMBINDEX, TROPONINI in the last 168 hours. BNP (last 3 results) No results for input(s): PROBNP in the last 8760 hours. HbA1C: No results for input(s): HGBA1C in the last 72  hours. CBG: No results for input(s): GLUCAP in the last 168 hours. Lipid Profile: No results for input(s): CHOL, HDL, LDLCALC, TRIG, CHOLHDL, LDLDIRECT in the last 72 hours. Thyroid Function Tests: No results for input(s): TSH, T4TOTAL, FREET4, T3FREE, THYROIDAB in the last 72 hours. Anemia Panel: No results  for input(s): VITAMINB12, FOLATE, FERRITIN, TIBC, IRON, RETICCTPCT in the last 72 hours. Urine analysis:    Component Value Date/Time   COLORURINE YELLOW 11/15/2012 1203   APPEARANCEUR CLEAR 11/15/2012 1203   LABSPEC 1.017 11/15/2012 1203   PHURINE 6.0 11/15/2012 1203   GLUCOSEU NEGATIVE 11/15/2012 1203   HGBUR NEGATIVE 11/15/2012 1203   BILIRUBINUR NEGATIVE 11/15/2012 1203   KETONESUR NEGATIVE 11/15/2012 1203   PROTEINUR 30 (A) 11/15/2012 1203   UROBILINOGEN 0.2 11/15/2012 1203   NITRITE NEGATIVE 11/15/2012 1203   LEUKOCYTESUR MODERATE (A) 11/15/2012 1203    Radiological Exams on Admission: CT Code Stroke CTA Head W/WO contrast  Result Date: 10/21/2020 CLINICAL DATA:  Focal neuro deficit, > 6 hrs, stroke suspected EXAM: CT ANGIOGRAPHY HEAD AND NECK TECHNIQUE: Multidetector CT imaging of the head and neck was performed using the standard protocol during bolus administration of intravenous contrast. Multiplanar CT image reconstructions and MIPs were obtained to evaluate the vascular anatomy. Carotid stenosis measurements (when applicable) are obtained utilizing NASCET criteria, using the distal internal carotid diameter as the denominator. CONTRAST:  OMNIPAQUE IOHEXOL 350 MG/ML SOLN COMPARISON:  Concurrent noncontrast head CT and CT cerebral perfusion. FINDINGS: CTA NECK FINDINGS Aortic arch: Standard branching. Imaged portion shows no evidence of aneurysm or dissection. No significant stenosis of the major arch vessel origins. Minimal atherosclerotic calcifications. Right carotid system: Minimal bifurcation atherosclerotic calcifications. Patent. Left carotid system: Patent.  Vertebral arteries: Codominant. Skeleton: Multilevel spondylosis.  No acute finding. Other neck: No acute soft tissue finding. Upper chest: Atelectasis.  No acute finding. Review of the MIP images confirms the above findings CTA HEAD FINDINGS Anterior circulation: Bilateral carotid siphon atherosclerotic calcifications. Patent ICAs. Patent ACAs. Patent left MCA. Patent right M1 segment. High-grade narrowing of a right M3 branch with non opacification distally. Posterior circulation: Patent V4 segments. Minimal right V4 segment atherosclerotic calcifications. Proximally patent PICA. Basilar and superior cerebellar arteries are patent. Patent left PCA. Patent right PCA. Venous sinuses: As permitted by contrast timing, patent. Anatomic variants: None. Review of the MIP images confirms the above findings IMPRESSION: CTA neck: No acute vascular finding within the neck. CTA head: High-grade narrowing of right M3 branch with non opacification distally. Electronically Signed   By: Stana Bunting M.D.   On: 10/21/2020 13:43   CT Code Stroke CTA Neck W/WO contrast  Result Date: 10/21/2020 CLINICAL DATA:  Focal neuro deficit, > 6 hrs, stroke suspected EXAM: CT ANGIOGRAPHY HEAD AND NECK TECHNIQUE: Multidetector CT imaging of the head and neck was performed using the standard protocol during bolus administration of intravenous contrast. Multiplanar CT image reconstructions and MIPs were obtained to evaluate the vascular anatomy. Carotid stenosis measurements (when applicable) are obtained utilizing NASCET criteria, using the distal internal carotid diameter as the denominator. CONTRAST:  OMNIPAQUE IOHEXOL 350 MG/ML SOLN COMPARISON:  Concurrent noncontrast head CT and CT cerebral perfusion. FINDINGS: CTA NECK FINDINGS Aortic arch: Standard branching. Imaged portion shows no evidence of aneurysm or dissection. No significant stenosis of the major arch vessel origins. Minimal atherosclerotic calcifications. Right  carotid system: Minimal bifurcation atherosclerotic calcifications. Patent. Left carotid system: Patent. Vertebral arteries: Codominant. Skeleton: Multilevel spondylosis.  No acute finding. Other neck: No acute soft tissue finding. Upper chest: Atelectasis.  No acute finding. Review of the MIP images confirms the above findings CTA HEAD FINDINGS Anterior circulation: Bilateral carotid siphon atherosclerotic calcifications. Patent ICAs. Patent ACAs. Patent left MCA. Patent right M1 segment. High-grade narrowing of a right M3 branch with non opacification distally. Posterior  circulation: Patent V4 segments. Minimal right V4 segment atherosclerotic calcifications. Proximally patent PICA. Basilar and superior cerebellar arteries are patent. Patent left PCA. Patent right PCA. Venous sinuses: As permitted by contrast timing, patent. Anatomic variants: None. Review of the MIP images confirms the above findings IMPRESSION: CTA neck: No acute vascular finding within the neck. CTA head: High-grade narrowing of right M3 branch with non opacification distally. Electronically Signed   By: Stana Bunting M.D.   On: 10/21/2020 13:43   CT Code Stroke Cerebral Perfusion with contrast  Result Date: 10/21/2020 CLINICAL DATA:  Focal neuro deficit, > 6 hrs, stroke suspected EXAM: CT PERFUSION BRAIN TECHNIQUE: Multiphase CT imaging of the brain was performed following IV bolus contrast injection. Subsequent parametric perfusion maps were calculated using RAPID software. CONTRAST:  OMNIPAQUE IOHEXOL 350 MG/ML SOLN COMPARISON:  Concurrent head CT. FINDINGS: CT Brain Perfusion Findings: CBF (<30%) Volume: 84mL Perfusion (Tmax>6.0s) volume: 75mL Mismatch Volume: 5mL ASPECTS on noncontrast CT Head: 9 at 13:05 today. Infarct Core: 6 mL Infarction Location:Right frontal lobe. IMPRESSION: Acute right frontal infarct, core of 6 mL. Electronically Signed   By: Stana Bunting M.D.   On: 10/21/2020 13:27   CT HEAD CODE STROKE WO  CONTRAST  Result Date: 10/21/2020 CLINICAL DATA:  Code stroke.  Neuro deficit, acute, stroke suspected EXAM: CT HEAD WITHOUT CONTRAST TECHNIQUE: Contiguous axial images were obtained from the base of the skull through the vertex without intravenous contrast. COMPARISON:  None. FINDINGS: Brain: No intracranial hemorrhage. Ill-defined hypodense region involving the right frontal lobe (2:22). No mass lesion. No midline shift, ventriculomegaly or extra-axial fluid collection. Mild chronic microvascular ischemic changes. Vascular: No hyperdense vessel or unexpected calcification. Bilateral skull base atherosclerotic calcifications. Skull: No acute finding. Sinuses/Orbits: No acute orbital finding. Pneumatized paranasal sinuses and mastoid air cells. Other: None. ASPECTS [facility] Stroke Program Early CT Score) - Ganglionic level infarction (caudate, lentiform nuclei, internal capsule, insula, M1-M3 cortex): 7 - Supraganglionic infarction (M4-M6 cortex): 2 Total score (0-10 with 10 being normal): 9 IMPRESSION: 1. Ill-defined right frontal hypodensity concerning for acute infarct. No intracranial hemorrhage. 2. ASPECTS is 9 Code stroke imaging results were communicated on 10/21/2020 at 1:04 pm to provider Dr. Amada Jupiter via secure text paging. Electronically Signed   By: Stana Bunting M.D.   On: 10/21/2020 13:05    EKG: Independently reviewed. Sinus rhythm, no acute changes  Assessment/Plan Active Problems:   CVA (cerebral vascular accident) (HCC)   HYPERLIPIDEMIA TYPE I / IV   Paroxysmal a-fib (HCC)   Gout    1. Neuro - patient with acute stroke with Left distal UE weakness, left facial droop. CT reveals right frontal lesion, CTA with right M3 occlusionn. Symptoms more suggestive of MCA territory event. Outside window for TPA. Had lipid panel 10/14/19 Plan Medical tele admit  Stroke team eval: PT/OT/SLP  Lovenox now, will need NOAC on-going - Xarelto vs Eliquis  Delayed brain imaging for MCA  territory injury-will defer to neurology  Additional lab pending TSH, Lipid panel, A1C  2. HLD - on red yeast rice and finofibrate Plan Crestor 20 mg daily  3. PAF - in sinus rhythm at exam. He has had PAF for some time. Plan Long-term anticoagulation Xarelto vs Eliquis  4. Gout - no tophi. No inflammed joint Plan Continue prn colchicine  Uric acid level    DVT prophylaxis: lovenox  Code Status: full code  Family Communication: wife at bedside. Daughter also included in conversation with Dr. Amada Jupiter  Disposition Plan: home when  stable  Consults called: Neuro - Dr. Amada Jupiter (with names) Admission status: inpatient - medical tele    Illene Regulus MD [facility] Pager [phone]  If 7PM-7AM, please contact night-coverage [email or web address] Password [facility]  10/21/2020, 2:48 PM

## 2020-10-22 ENCOUNTER — Encounter (HOSPITAL_COMMUNITY): Payer: Self-pay | Admitting: Internal Medicine

## 2020-10-22 ENCOUNTER — Inpatient Hospital Stay (HOSPITAL_COMMUNITY): Payer: Medicare Other

## 2020-10-22 ENCOUNTER — Other Ambulatory Visit (HOSPITAL_COMMUNITY): Payer: Medicare Other

## 2020-10-22 DIAGNOSIS — E783 Hyperchylomicronemia: Secondary | ICD-10-CM

## 2020-10-22 DIAGNOSIS — I63411 Cerebral infarction due to embolism of right middle cerebral artery: Secondary | ICD-10-CM

## 2020-10-22 LAB — URIC ACID: Uric Acid, Serum: 7.3 mg/dL (ref 3.7–8.6)

## 2020-10-22 LAB — LIPID PANEL
Cholesterol: 158 mg/dL (ref 0–200)
HDL: 26 mg/dL — ABNORMAL LOW (ref >40)
LDL Cholesterol: 98 mg/dL (ref 0–99)
Total CHOL/HDL Ratio: 6.1 RATIO
Triglycerides: 172 mg/dL — ABNORMAL HIGH (ref <150)
VLDL: 34 mg/dL (ref 0–40)

## 2020-10-22 LAB — TSH: TSH: 1.489 u[IU]/mL (ref 0.350–4.500)

## 2020-10-22 MED ORDER — ROSUVASTATIN CALCIUM 20 MG PO TABS
20.0000 mg | ORAL_TABLET | Freq: Every day | ORAL | Status: DC
Start: 2020-10-23 — End: 2020-10-23
  Administered 2020-10-23: 20 mg via ORAL
  Filled 2020-10-22: qty 1

## 2020-10-22 MED ORDER — APIXABAN 5 MG PO TABS
5.0000 mg | ORAL_TABLET | Freq: Two times a day (BID) | ORAL | Status: DC
  Administered 2020-10-22 – 2020-10-23 (×3): 5 mg via ORAL
  Filled 2020-10-22 (×3): qty 1

## 2020-10-22 NOTE — Progress Notes (Addendum)
STROKE TEAM PROGRESS NOTE   INTERVAL HISTORY No acute events since arrival.  MRI confirmed acute infarct in the posterior right frontal lobe. Awaiting TTE.  Patient reports continued left hand weakness and difficulty speaking. His wife reports these symptoms are stable since admission from her perspective.  We discussed recommendation for anticoagulation. Rationale explained. Questions were answered. They agree to anticoagulation with Eliquis. Wife states their daughter is an interventional cardiologist who also recommended Eliquis.   Vitals:   10/21/20 2214 10/21/20 2322 10/22/20 0119 10/22/20 0318  BP: 127/65 123/73 118/69 130/70  Pulse: 73 75 75 74  Resp: 18 18 18 18   Temp: 97.6 F (36.4 C) 97.7 F (36.5 C) 98 F (36.7 C) 98 F (36.7 C)  TempSrc: Oral Oral Oral Oral  SpO2: 97% 94% 96% 96%  Weight: 80.7 kg     Height: 5\' 7"  (1.702 m)      CBC:  Recent Labs  Lab 10/21/20 1245 10/21/20 1251  WBC 10.8*  --   NEUTROABS 7.1  --   HGB 12.8* 13.3  HCT 39.8 39.0  MCV 93.0  --   PLT 246  --    Basic Metabolic Panel:  Recent Labs  Lab 10/21/20 1245 10/21/20 1251  NA 142 142  K 4.1 3.9  CL 106 106  CO2 25  --   GLUCOSE 112* 103*  BUN 22 25*  CREATININE 1.37* 1.30*  CALCIUM 9.4  --    Lipid Panel:  Recent Labs  Lab 10/22/20 0315  CHOL 158  TRIG 172*  HDL 26*  CHOLHDL 6.1  VLDL 34  LDLCALC 98   HgbA1c:  Recent Labs  Lab 10/21/20 1245  HGBA1C 5.6   Urine Drug Screen:  Recent Labs  Lab 10/21/20 2000  LABOPIA NONE DETECTED  COCAINSCRNUR NONE DETECTED  LABBENZ NONE DETECTED  AMPHETMU NONE DETECTED  THCU NONE DETECTED  LABBARB NONE DETECTED    Alcohol Level  Recent Labs  Lab 10/21/20 1255  ETH <10    IMAGING past 24 hours CT Code Stroke CTA Head W/WO contrast  Result Date: 10/21/2020 CLINICAL DATA:  Focal neuro deficit, > 6 hrs, stroke suspected EXAM: CT ANGIOGRAPHY HEAD AND NECK TECHNIQUE: Multidetector CT imaging of the head and neck was  performed using the standard protocol during bolus administration of intravenous contrast. Multiplanar CT image reconstructions and MIPs were obtained to evaluate the vascular anatomy. Carotid stenosis measurements (when applicable) are obtained utilizing NASCET criteria, using the distal internal carotid diameter as the denominator. CONTRAST:  100mL OMNIPAQUE IOHEXOL 350 MG/ML SOLN COMPARISON:  Concurrent noncontrast head CT and CT cerebral perfusion. FINDINGS: CTA NECK FINDINGS Aortic arch: Standard branching. Imaged portion shows no evidence of aneurysm or dissection. No significant stenosis of the major arch vessel origins. Minimal atherosclerotic calcifications. Right carotid system: Minimal bifurcation atherosclerotic calcifications. Patent. Left carotid system: Patent. Vertebral arteries: Codominant. Skeleton: Multilevel spondylosis.  No acute finding. Other neck: No acute soft tissue finding. Upper chest: Atelectasis.  No acute finding. Review of the MIP images confirms the above findings CTA HEAD FINDINGS Anterior circulation: Bilateral carotid siphon atherosclerotic calcifications. Patent ICAs. Patent ACAs. Patent left MCA. Patent right M1 segment. High-grade narrowing of a right M3 branch with non opacification distally. Posterior circulation: Patent V4 segments. Minimal right V4 segment atherosclerotic calcifications. Proximally patent PICA. Basilar and superior cerebellar arteries are patent. Patent left PCA. Patent right PCA. Venous sinuses: As permitted by contrast timing, patent. Anatomic variants: None. Review of the MIP images confirms the above  findings IMPRESSION: CTA neck: No acute vascular finding within the neck. CTA head: High-grade narrowing of right M3 branch with non opacification distally. Electronically Signed   By: Stana Bunting M.D.   On: 10/21/2020 13:43   DG Chest 2 View  Result Date: 10/21/2020 CLINICAL DATA:  Left facial droop and left arm weakness, atrial fibrillation  EXAM: CHEST - 2 VIEW COMPARISON:  11/10/2012 FINDINGS: The heart size and mediastinal contours are within normal limits. Both lungs are clear. The visualized skeletal structures are unremarkable. IMPRESSION: No active cardiopulmonary disease. Electronically Signed   By: Sharlet Salina M.D.   On: 10/21/2020 15:20   CT Code Stroke CTA Neck W/WO contrast  Result Date: 10/21/2020 CLINICAL DATA:  Focal neuro deficit, > 6 hrs, stroke suspected EXAM: CT ANGIOGRAPHY HEAD AND NECK TECHNIQUE: Multidetector CT imaging of the head and neck was performed using the standard protocol during bolus administration of intravenous contrast. Multiplanar CT image reconstructions and MIPs were obtained to evaluate the vascular anatomy. Carotid stenosis measurements (when applicable) are obtained utilizing NASCET criteria, using the distal internal carotid diameter as the denominator. CONTRAST:  OMNIPAQUE IOHEXOL 350 MG/ML SOLN COMPARISON:  Concurrent noncontrast head CT and CT cerebral perfusion. FINDINGS: CTA NECK FINDINGS Aortic arch: Standard branching. Imaged portion shows no evidence of aneurysm or dissection. No significant stenosis of the major arch vessel origins. Minimal atherosclerotic calcifications. Right carotid system: Minimal bifurcation atherosclerotic calcifications. Patent. Left carotid system: Patent. Vertebral arteries: Codominant. Skeleton: Multilevel spondylosis.  No acute finding. Other neck: No acute soft tissue finding. Upper chest: Atelectasis.  No acute finding. Review of the MIP images confirms the above findings CTA HEAD FINDINGS Anterior circulation: Bilateral carotid siphon atherosclerotic calcifications. Patent ICAs. Patent ACAs. Patent left MCA. Patent right M1 segment. High-grade narrowing of a right M3 branch with non opacification distally. Posterior circulation: Patent V4 segments. Minimal right V4 segment atherosclerotic calcifications. Proximally patent PICA. Basilar and superior cerebellar  arteries are patent. Patent left PCA. Patent right PCA. Venous sinuses: As permitted by contrast timing, patent. Anatomic variants: None. Review of the MIP images confirms the above findings IMPRESSION: CTA neck: No acute vascular finding within the neck. CTA head: High-grade narrowing of right M3 branch with non opacification distally. Electronically Signed   By: Stana Bunting M.D.   On: 10/21/2020 13:43   MR BRAIN WO CONTRAST  Result Date: 10/22/2020 CLINICAL DATA:  Stroke follow-up EXAM: MRI HEAD WITHOUT CONTRAST TECHNIQUE: Multiplanar, multiecho pulse sequences of the brain and surrounding structures were obtained without intravenous contrast. COMPARISON:  Head CT and CTA from yesterday FINDINGS: Brain: Posterior right frontal cortically based infarct appears similar in extent to prior CT and unchanged from CT perfusion. No hemorrhage or other acute infarct. No hydrocephalus, mass, or collection. Cerebral volume loss without specific pattern. Chronic small vessel ischemia in the deep white matter is mild. No chronic blood products. Vascular: Normal flow voids. Skull and upper cervical spine: Normal marrow signal Sinuses/Orbits: Negative IMPRESSION: Moderate acute infarct in the posterior right frontal lobe. No progression since NCCT and CTP yesterday. Electronically Signed   By: Marnee Spring M.D.   On: 10/22/2020 07:02   CT Code Stroke Cerebral Perfusion with contrast  Result Date: 10/21/2020 CLINICAL DATA:  Focal neuro deficit, > 6 hrs, stroke suspected EXAM: CT PERFUSION BRAIN TECHNIQUE: Multiphase CT imaging of the brain was performed following IV bolus contrast injection. Subsequent parametric perfusion maps were calculated using RAPID software. CONTRAST:  OMNIPAQUE IOHEXOL 350 MG/ML SOLN COMPARISON:  Concurrent head CT. FINDINGS: CT Brain Perfusion Findings: CBF (<30%) Volume: 45mL Perfusion (Tmax>6.0s) volume: 68mL Mismatch Volume: 14mL ASPECTS on noncontrast CT Head: 9 at 13:05 today.  Infarct Core: 6 mL Infarction Location:Right frontal lobe. IMPRESSION: Acute right frontal infarct, core of 6 mL. Electronically Signed   By: Stana Bunting M.D.   On: 10/21/2020 13:27   CT HEAD CODE STROKE WO CONTRAST  Result Date: 10/21/2020 CLINICAL DATA:  Code stroke.  Neuro deficit, acute, stroke suspected EXAM: CT HEAD WITHOUT CONTRAST TECHNIQUE: Contiguous axial images were obtained from the base of the skull through the vertex without intravenous contrast. COMPARISON:  None. FINDINGS: Brain: No intracranial hemorrhage. Ill-defined hypodense region involving the right frontal lobe (2:22). No mass lesion. No midline shift, ventriculomegaly or extra-axial fluid collection. Mild chronic microvascular ischemic changes. Vascular: No hyperdense vessel or unexpected calcification. Bilateral skull base atherosclerotic calcifications. Skull: No acute finding. Sinuses/Orbits: No acute orbital finding. Pneumatized paranasal sinuses and mastoid air cells. Other: None. ASPECTS [facility] Stroke Program Early CT Score) - Ganglionic level infarction (caudate, lentiform nuclei, internal capsule, insula, M1-M3 cortex): 7 - Supraganglionic infarction (M4-M6 cortex): 2 Total score (0-10 with 10 being normal): 9 IMPRESSION: 1. Ill-defined right frontal hypodensity concerning for acute infarct. No intracranial hemorrhage. 2. ASPECTS is 9 Code stroke imaging results were communicated on 10/21/2020 at 1:04 pm to provider Dr. Amada Jupiter via secure text paging. Electronically Signed   By: Stana Bunting M.D.   On: 10/21/2020 13:05   PHYSICAL EXAM Temp:  [97.6 F (36.4 C)-98.1 F (36.7 C)] 98 F (36.7 C) (01/24 0318) Pulse Rate:  [70-83] 74 (01/24 0318) Resp:  [14-35] 18 (01/24 0318) BP: (97-167)/(53-87) 130/70 (01/24 0318) SpO2:  [92 %-100 %] 96 % (01/24 0318) Weight:  [80.7 kg] 80.7 kg (01/23 2214)  General - Well nourished, well developed elderly male, in no apparent distress.   Mental Status -  Mildly  drowsy Dysarthric Names 3/3 objects. Repetition with full sentences. Comprehension was assessed and found intact. Attention span and concentration were mildly impaired with drowsiness. Recent and remote memory were intact. (recalled last night's events and married x44 years).   Cranial Nerves II - XII - II - Visual field intact. III, IV, VI - Extraocular movements intact, does not track well to left.  V - Facial sensation intact bilaterally V1-V3 VII - Facial movement intact bilaterally, mild left droop VIII - Hearing & vestibular intact bilaterally.  X - Palate elevates symmetrically. XI - Chin turning & shoulder shrug intact bilaterally.  XII - Tongue protrusion intact and midline.   Motor Strength - Tone is normal. Bulk is normal. 5/5 strength was present in bilateral legs and right arm, proximally 5/5  without drift in his left arm,  1-2/5 distal strength in the left hand persists. Unable to give thumbs up. Unable to hold cup.  Motor Tone - Muscle tone was assessed at the neck and appendages and was normal  Sensory - Light touch assessed and were symmetrical.    Coordination - LUE impaired gross and fine motor function in hand. Mild dysmetria LLE,   Gait and Station - deferred.  ASSESSMENT/PLAN [name] is a 75 y.o. male with history of atrial fibrillation (not on anticoagulation), hyperlipidemia [name] Short is a 75 y.o. male with medical history significant of HLD, gout, OA, BPH, GERD. He had bilateral inquinal hernia repair 10/16/20.  EKG 10/16/20 revealed A. Fib He presented with slurred speech and left  hand weakness that started on  awakening from a nap.   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   CTA neck No acute vascular finding within the neck.  CTA head High-grade narrowing of right M3 branch with non opacification  Distally.  CT Perfusion Acute right frontal infarct, core of 6 mL.  MRI Moderate acute infarct in the posterior right frontal lobe.   2D Echo PENDING  Now on Eliquis  5mg  BID   LDL 98  HgbA1c 5.6  Therapy recommendations: Outpatient PT and OT  Disposition:  Likely home with outpatient therapies   Follow up in 4 weeks with [facility]. Appt. Requested.   PAF  Diagnosed in 2011, incidental finding while doing treadmill stress test  Asymptomatic, seen by Dr. 2012 - put on ASA  Follows with PCP over the years - continued on ASA  Currently NSR on tele  However, concerning currently embolic stroke was due to PAF not on AC  Put on eliquis 5mg  bid  BP management . BP stable . Permissive hypertension (OK if < 180/105) but gradually normalize in 3-5 days . Long-term BP goal normotensive  Hyperlipidemia   Home meds: on red yeast rice and finofibrate  LDL 98, goal < 70  Crestor 20mg  daily  Continue statin at discharge  Other Stroke Risk Factors  Advanced Age >/= 45  Cigar smoker x 15 years. Advised to stop smoking  Other Active Problems    Hospital day # 1  This plan of care was directed by Dr. .  , NP-C  ATTENDING NOTE: I reviewed above note and agree with the assessment and plan. Pt was seen and examined.   75 year old male with history of BPH, HLD, PAF not on AC, kidney stone admitted for slurred speech, left facial droop and left hand weakness.  CT concerning right frontal infarct.  CTA head and neck showed right M3 high-grade stenosis and CT perfusion showed small right frontal core infarct and small penumbra.  MRI confirmed right MCA cortical infarct.  2D echo pending.  A1c 5.6, LDL 98, UDS negative.  On exam, pt AAO x 3, no aphasia, following all simple commands, able to name and repeat, mild dysarthria. PERRL, EOMI, visual field full. Left facial droop with mild dysarthria. Tongue midline. Left UE deltoid, bicep and tricep 4/5, wrist  extension 3/5, finger grip 1/5. RUE and BLEs 5/5. Sensation symmetrical, FTN intact although slow on the left. Gait not tested.   Patient stroke clearly embolic pattern, most likely due to PAF not on AC.  Patient was diagnosed with PAF in 2011, however asymptomatic, cardiology did not recommend [facility] but put on aspirin at that time.  Over the years, patient largely asymptomatic and continues on aspirin.  However, given current cardioembolic pattern stroke, discussed with patient and the family regarding anticoagulation, they agree to proceed.  Eliquis started today.  LDL 98, patient on fenofibrate PTA, add Crestor 20 for stroke prevention.  PT/OT recommend outpatient PT and OT.  Neurology will sign off. Please call with questions. Pt will follow up with stroke clinic NP at [facility] in about 4 weeks. Thanks for the consult.   2012, MD PhD Stroke Neurology 10/22/2020 1:05 PM    To contact Stroke Continuity provider, please refer to [facility]. After hours, contact General Neurology

## 2020-10-22 NOTE — Discharge Instructions (Signed)

## 2020-10-22 NOTE — Plan of Care (Signed)

## 2020-10-22 NOTE — Progress Notes (Signed)
PROGRESS NOTE    [name] IhaRichard I Short  ZOX:[id number] DOB: 1946/04/30 DOA: 10/21/2020 PCP: Sheliah Hatchabori, Katherine E, MD  Brief Narrative: 75 year old male with history of atrial fibrillation not on anticoagulation, dyslipidemia, hypertension, gout, GERD, underwent inguinal hernia repair on 1/18, EKG then revealed A. fib who presented to the ED with slurred speech, left hand weakness on 1/23   Assessment & Plan:     Acute CVA -MRI brain notes Moderate acute infarct in the posterior right frontal lobe -He has residual left-sided weakness, facial droop and dysarthria -Complete stroke work-up, assess for dysphagia -2D echo pending -LDL is 98, hemoglobin A1c is 5.6 -Stroke is suspected to be embolic secondary to atrial fibrillation, he was on aspirin at baseline for this -Starting Eliquis today -PT OT, SLP evaluations  Paroxysmal atrial fibrillation -In sinus rhythm at this time,, starting Eliquis today  History of gout -Stable, on colchicine as needed  Recent inguinal hernia repair -Follow-up with surgeon  DVT prophylaxis: Eliquis Code Status: Full code Family Communication: No family at bedside Disposition Plan:  Status is: Inpatient  Remains inpatient appropriate because:Inpatient level of care appropriate due to severity of illness   Dispo: The patient is from: Home              Anticipated d/c is to: SNF              Anticipated d/c date is: 2 days              Patient currently is not medically stable to d/c.   Difficult to place patient No        Consultants:   Neuro   Procedures:   Antimicrobials:    Subjective: -Continues to have left hand weakness, slurred speech and facial droop  Objective: Vitals:   10/21/20 2214 10/21/20 2322 10/22/20 0119 10/22/20 0318  BP: 127/65 123/73 118/69 130/70  Pulse: 73 75 75 74  Resp: 18 18 18 18   Temp: 97.6 F (36.4 C) 97.7 F (36.5 C) 98 F (36.7 C) 98 F (36.7 C)  TempSrc: Oral Oral Oral Oral  SpO2: 97% 94% 96%  96%  Weight: 80.7 kg     Height: 5\' 7"  (1.702 m)       Intake/Output Summary (Last 24 hours) at 10/22/2020 1149 Last data filed at 10/22/2020 0359 Gross per 24 hour  Intake 380 ml  Output -  Net 380 ml   Filed Weights   10/21/20 2214  Weight: 80.7 kg    Examination:  General exam: Pleasant elderly male sitting up in bed, AAOx3, no distress HEENT: Mild left facial droop CVS: S1-S2, regular rate rhythm Lungs: Clear bilaterally Abdomen: Soft, nontender, bowel sounds present Remedies: No edema Neuro: Left upper extremity weakness especially in distal left hand, left facial droop and dysarthria Psych: Appropriate mood and affect  Data Reviewed:   CBC: Recent Labs  Lab 10/21/20 1245 10/21/20 1251  WBC 10.8*  --   NEUTROABS 7.1  --   HGB 12.8* 13.3  HCT 39.8 39.0  MCV 93.0  --   PLT 246  --    Basic Metabolic Panel: Recent Labs  Lab 10/21/20 1245 10/21/20 1251  NA 142 142  K 4.1 3.9  CL 106 106  CO2 25  --   GLUCOSE 112* 103*  BUN 22 25*  CREATININE 1.37* 1.30*  CALCIUM 9.4  --    GFR: Estimated Creatinine Clearance: 51.5 mL/min (A) (by C-G formula based on SCr of 1.3 mg/dL (H)). Liver Function Tests:  Recent Labs  Lab 10/21/20 1245  AST 24  ALT 18  ALKPHOS 45  BILITOT 1.0  PROT 7.0  ALBUMIN 3.5   No results for input(s): LIPASE, AMYLASE in the last 168 hours. No results for input(s): AMMONIA in the last 168 hours. Coagulation Profile: Recent Labs  Lab 10/21/20 1245  INR 1.1   Cardiac Enzymes: No results for input(s): CKTOTAL, CKMB, CKMBINDEX, TROPONINI in the last 168 hours. BNP (last 3 results) No results for input(s): PROBNP in the last 8760 hours. HbA1C: Recent Labs    10/21/20 1245  HGBA1C 5.6   CBG: No results for input(s): GLUCAP in the last 168 hours. Lipid Profile: Recent Labs    10/22/20 0315  CHOL 158  HDL 26*  LDLCALC 98  TRIG 440*  CHOLHDL 6.1   Thyroid Function Tests: Recent Labs    10/22/20 0315  TSH 1.489    Anemia Panel: No results for input(s): VITAMINB12, FOLATE, FERRITIN, TIBC, IRON, RETICCTPCT in the last 72 hours. Urine analysis:    Component Value Date/Time   COLORURINE YELLOW 10/21/2020 2000   APPEARANCEUR CLEAR 10/21/2020 2000   LABSPEC >1.046 (H) 10/21/2020 2000   PHURINE 5.0 10/21/2020 2000   GLUCOSEU NEGATIVE 10/21/2020 2000   HGBUR NEGATIVE 10/21/2020 2000   BILIRUBINUR NEGATIVE 10/21/2020 2000   KETONESUR NEGATIVE 10/21/2020 2000   PROTEINUR NEGATIVE 10/21/2020 2000   UROBILINOGEN 0.2 11/15/2012 1203   NITRITE NEGATIVE 10/21/2020 2000   LEUKOCYTESUR NEGATIVE 10/21/2020 2000   Sepsis Labs: @LABRCNTIP (procalcitonin:4,lacticidven:4)  ) Recent Results (from the past 240 hour(s))  SARS CORONAVIRUS 2 (TAT 6-24 HRS) Nasopharyngeal Nasopharyngeal Swab     Status: None   Collection Time: 10/13/20  1:58 PM   Specimen: Nasopharyngeal Swab  Result Value Ref Range Status   SARS Coronavirus 2 NEGATIVE NEGATIVE Final    Comment: (NOTE) SARS-CoV-2 target nucleic acids are NOT DETECTED.  The SARS-CoV-2 RNA is generally detectable in upper and lower respiratory specimens during the acute phase of infection. Negative results do not preclude SARS-CoV-2 infection, do not rule out co-infections with other pathogens, and should not be used as the sole basis for treatment or other patient management decisions. Negative results must be combined with clinical observations, patient history, and epidemiological information. The expected result is Negative.  Fact Sheet for Patients: 10/15/20  Fact Sheet for Healthcare Providers: HairSlick.no  This test is not yet approved or cleared by the quierodirigir.com FDA and  has been authorized for detection and/or diagnosis of SARS-CoV-2 by FDA under an Emergency Use Authorization (EUA). This EUA will remain  in effect (meaning this test can be used) for the duration of the COVID-19  declaration under Se ction 564(b)(1) of the Act, 21 U.S.C. section 360bbb-3(b)(1), unless the authorization is terminated or revoked sooner.  Performed at [facility], [address], [place], [place]   SARS Coronavirus 2 by RT PCR (hospital order, performed in [facility] hospital lab) Nasopharyngeal Nasopharyngeal Swab     Status: None   Collection Time: 10/21/20  6:47 PM   Specimen: Nasopharyngeal Swab  Result Value Ref Range Status   SARS Coronavirus 2 NEGATIVE NEGATIVE Final    Comment: (NOTE) SARS-CoV-2 target nucleic acids are NOT DETECTED.  The SARS-CoV-2 RNA is generally detectable in upper and lower respiratory specimens during the acute phase of infection. The lowest concentration of SARS-CoV-2 viral copies this assay can detect is 250 copies / mL. A negative result does not preclude SARS-CoV-2 infection and should not  be used as the sole basis for treatment or other patient management decisions.  A negative result may occur with improper specimen collection / handling, submission of specimen other than nasopharyngeal swab, presence of viral mutation(s) within the areas targeted by this assay, and inadequate number of viral copies (<250 copies / mL). A negative result must be combined with clinical observations, patient history, and epidemiological information.  Fact Sheet for Patients:   BoilerBrush.com.cy  Fact Sheet for Healthcare Providers: https://pope.com/  This test is not yet approved or  cleared by the [place] FDA and has been authorized for detection and/or diagnosis of SARS-CoV-2 by FDA under an Emergency Use Authorization (EUA).  This EUA will remain in effect (meaning this test can be used) for the duration of the COVID-19 declaration under Section 564(b)(1) of the Act, 21 U.S.C. section 360bbb-3(b)(1), unless the authorization is terminated or revoked sooner.  Performed at [facility], [address] [place], [place] [address]          Radiology Studies: CT Code Stroke CTA Head W/WO contrast  Result Date: 10/21/2020 CLINICAL DATA:  Focal neuro deficit, > 6 hrs, stroke suspected EXAM: CT ANGIOGRAPHY HEAD AND NECK TECHNIQUE: Multidetector CT imaging of the head and neck was performed using the standard protocol during bolus administration of intravenous contrast. Multiplanar CT image reconstructions and MIPs were obtained to evaluate the vascular anatomy. Carotid stenosis measurements (when applicable) are obtained utilizing NASCET criteria, using the distal internal carotid diameter as the denominator. CONTRAST:  OMNIPAQUE IOHEXOL 350 MG/ML SOLN COMPARISON:  Concurrent noncontrast head CT and CT cerebral perfusion. FINDINGS: CTA NECK FINDINGS Aortic arch: Standard branching. Imaged portion shows no evidence of aneurysm or dissection. No significant stenosis of the major arch vessel origins. Minimal atherosclerotic calcifications. Right carotid system: Minimal bifurcation atherosclerotic calcifications. Patent. Left carotid system: Patent. Vertebral arteries: Codominant. Skeleton: Multilevel spondylosis.  No acute finding. Other neck: No acute soft tissue finding. Upper chest: Atelectasis.  No acute finding. Review of the MIP images confirms the above findings CTA HEAD FINDINGS Anterior circulation: Bilateral carotid siphon atherosclerotic calcifications. Patent ICAs. Patent ACAs. Patent left MCA. Patent right M1 segment. High-grade narrowing of a right M3 branch with non opacification distally. Posterior circulation: Patent V4 segments. Minimal right V4 segment atherosclerotic calcifications. Proximally patent PICA. Basilar and superior cerebellar arteries are patent. Patent left PCA. Patent right PCA. Venous sinuses: As permitted by contrast timing, patent. Anatomic variants: None. Review of the MIP images confirms the above findings IMPRESSION: CTA neck: No acute  vascular finding within the neck. CTA head: High-grade narrowing of right M3 branch with non opacification distally. Electronically Signed   By: Stana Bunting M.D.   On: 10/21/2020 13:43   DG Chest 2 View  Result Date: 10/21/2020 CLINICAL DATA:  Left facial droop and left arm weakness, atrial fibrillation EXAM: CHEST - 2 VIEW COMPARISON:  11/10/2012 FINDINGS: The heart size and mediastinal contours are within normal limits. Both lungs are clear. The visualized skeletal structures are unremarkable. IMPRESSION: No active cardiopulmonary disease. Electronically Signed   By: Sharlet Salina M.D.   On: 10/21/2020 15:20   CT Code Stroke CTA Neck W/WO contrast  Result Date: 10/21/2020 CLINICAL DATA:  Focal neuro deficit, > 6 hrs, stroke suspected EXAM: CT ANGIOGRAPHY HEAD AND NECK TECHNIQUE: Multidetector CT imaging of the head and neck was performed using the standard protocol during bolus administration of intravenous contrast. Multiplanar CT image reconstructions and MIPs were obtained to evaluate the  vascular anatomy. Carotid stenosis measurements (when applicable) are obtained utilizing NASCET criteria, using the distal internal carotid diameter as the denominator. CONTRAST:  OMNIPAQUE IOHEXOL 350 MG/ML SOLN COMPARISON:  Concurrent noncontrast head CT and CT cerebral perfusion. FINDINGS: CTA NECK FINDINGS Aortic arch: Standard branching. Imaged portion shows no evidence of aneurysm or dissection. No significant stenosis of the major arch vessel origins. Minimal atherosclerotic calcifications. Right carotid system: Minimal bifurcation atherosclerotic calcifications. Patent. Left carotid system: Patent. Vertebral arteries: Codominant. Skeleton: Multilevel spondylosis.  No acute finding. Other neck: No acute soft tissue finding. Upper chest: Atelectasis.  No acute finding. Review of the MIP images confirms the above findings CTA HEAD FINDINGS Anterior circulation: Bilateral carotid siphon  atherosclerotic calcifications. Patent ICAs. Patent ACAs. Patent left MCA. Patent right M1 segment. High-grade narrowing of a right M3 branch with non opacification distally. Posterior circulation: Patent V4 segments. Minimal right V4 segment atherosclerotic calcifications. Proximally patent PICA. Basilar and superior cerebellar arteries are patent. Patent left PCA. Patent right PCA. Venous sinuses: As permitted by contrast timing, patent. Anatomic variants: None. Review of the MIP images confirms the above findings IMPRESSION: CTA neck: No acute vascular finding within the neck. CTA head: High-grade narrowing of right M3 branch with non opacification distally. Electronically Signed   By: Stana Bunting M.D.   On: 10/21/2020 13:43   MR BRAIN WO CONTRAST  Result Date: 10/22/2020 CLINICAL DATA:  Stroke follow-up EXAM: MRI HEAD WITHOUT CONTRAST TECHNIQUE: Multiplanar, multiecho pulse sequences of the brain and surrounding structures were obtained without intravenous contrast. COMPARISON:  Head CT and CTA from yesterday FINDINGS: Brain: Posterior right frontal cortically based infarct appears similar in extent to prior CT and unchanged from CT perfusion. No hemorrhage or other acute infarct. No hydrocephalus, mass, or collection. Cerebral volume loss without specific pattern. Chronic small vessel ischemia in the deep white matter is mild. No chronic blood products. Vascular: Normal flow voids. Skull and upper cervical spine: Normal marrow signal Sinuses/Orbits: Negative IMPRESSION: Moderate acute infarct in the posterior right frontal lobe. No progression since NCCT and CTP yesterday. Electronically Signed   By: Marnee Spring M.D.   On: 10/22/2020 07:02   CT Code Stroke Cerebral Perfusion with contrast  Result Date: 10/21/2020 CLINICAL DATA:  Focal neuro deficit, > 6 hrs, stroke suspected EXAM: CT PERFUSION BRAIN TECHNIQUE: Multiphase CT imaging of the brain was performed following IV bolus contrast  injection. Subsequent parametric perfusion maps were calculated using RAPID software. CONTRAST:  OMNIPAQUE IOHEXOL 350 MG/ML SOLN COMPARISON:  Concurrent head CT. FINDINGS: CT Brain Perfusion Findings: CBF (<30%) Volume: 4mL Perfusion (Tmax>6.0s) volume: 52mL Mismatch Volume: 20mL ASPECTS on noncontrast CT Head: 9 at 13:05 today. Infarct Core: 6 mL Infarction Location:Right frontal lobe. IMPRESSION: Acute right frontal infarct, core of 6 mL. Electronically Signed   By: Stana Bunting M.D.   On: 10/21/2020 13:27   CT HEAD CODE STROKE WO CONTRAST  Result Date: 10/21/2020 CLINICAL DATA:  Code stroke.  Neuro deficit, acute, stroke suspected EXAM: CT HEAD WITHOUT CONTRAST TECHNIQUE: Contiguous axial images were obtained from the base of the skull through the vertex without intravenous contrast. COMPARISON:  None. FINDINGS: Brain: No intracranial hemorrhage. Ill-defined hypodense region involving the right frontal lobe (2:22). No mass lesion. No midline shift, ventriculomegaly or extra-axial fluid collection. Mild chronic microvascular ischemic changes. Vascular: No hyperdense vessel or unexpected calcification. Bilateral skull base atherosclerotic calcifications. Skull: No acute finding. Sinuses/Orbits: No acute orbital finding. Pneumatized paranasal sinuses and mastoid air cells. Other: None.  ASPECTS [facility]([place] Stroke Program Early CT Score) - Ganglionic level infarction (caudate, lentiform nuclei, internal capsule, insula, M1-M3 cortex): 7 - Supraganglionic infarction (M4-M6 cortex): 2 Total score (0-10 with 10 being normal): 9 IMPRESSION: 1. Ill-defined right frontal hypodensity concerning for acute infarct. No intracranial hemorrhage. 2. ASPECTS is 9 Code stroke imaging results were communicated on 10/21/2020 at 1:04 pm to provider Dr. Amada JupiterKirkpatrick via secure text paging. Electronically Signed   By: Stana Buntinghikanele  Emekauwa M.D.   On: 10/21/2020 13:05        Scheduled Meds: . apixaban  5 mg Oral BID  .  fenofibrate  160 mg Oral Daily  . rosuvastatin  40 mg Oral Daily   Continuous Infusions: . sodium chloride 50 mL/hr at 10/21/20 2023     LOS: 1 day    Time spent: 35min  Zannie CovePreetha Annmargaret Decaprio, MD [facility]  10/22/2020, 11:49 AM

## 2020-10-22 NOTE — Evaluation (Signed)
Occupational Therapy Evaluation Patient Details Name: [name] Short MRN: [id number] DOB: 1945-11-16 Today's Date: 10/22/2020    History of Present Illness 75 yo male presenting with left facial droop and left arm weakness. MRI showing moderate acute infarct in the posterior right frontal lobe. PMH including HLD, gout, OA, BPH, and GERD.   Clinical Impression   PTA, pt was living with his wife and was independent and active; enjoys playing tennis. Pt currently requiring Min A for ADLs and Min Guard A for functional mobility with RW. Pt presenting with decreased strength, processing, functional use of LUE, and activity tolerance. Pt would benefit from further acute OT to facilitate safe dc. Recommend dc to home with follow up at Neuro OP for further OT to optimize safety, independence with ADLs, and return to PLOF.     Follow Up Recommendations  Outpatient OT (Neuro OP)    Equipment Recommendations  Tub/shower seat    Recommendations for Other Services PT consult     Precautions / Restrictions Precautions Precautions: Fall      Mobility Bed Mobility Overal bed mobility: Needs Assistance Bed Mobility: Supine to Sit     Supine to sit: Supervision     General bed mobility comments: Supervision for safety    Transfers Overall transfer level: Needs assistance Equipment used: Rolling walker (2 wheeled) Transfers: Sit to/from Stand Sit to Stand: Min guard         General transfer comment: MIn Guard A for safety. No physical A needed    Balance Overall balance assessment: Needs assistance Sitting-balance support: Feet supported;No upper extremity supported Sitting balance-Leahy Scale: Good     Standing balance support: No upper extremity supported;During functional activity Standing balance-Leahy Scale: Fair                             ADL either performed or assessed with clinical judgement   ADL Overall ADL's : Needs  assistance/impaired Eating/Feeding: Minimal assistance Eating/Feeding Details (indicate cue type and reason): Min A for opening containers. Reporting difficulty chewing and thus unabel to eat breakfast. Reports no difficulty shallowing his drink. Notified RN. Grooming: Wash/dry face;Min guard;Standing Grooming Details (indicate cue type and reason): Pt using RUE only to turn on water and wash face with wash clothe. Requiring Mod questioning cues for use of wash clothe as pt just wiping his mouth with water at first. Upper Body Bathing: Minimal assistance;Sitting   Lower Body Bathing: Minimal assistance;Sit to/from stand   Upper Body Dressing : Minimal assistance;Sitting   Lower Body Dressing: Minimal assistance;Sit to/from stand   Toilet Transfer: Min guard;Ambulation;RW (simulated to recliner)           Functional mobility during ADLs: Min guard;Rolling walker General ADL Comments: Pt presenting with decreased strength, functional use of LUE, and poor activity tolerance. Pt performing ADLs and functional mobiltiy at [employer] A level     Vision Baseline Vision/History: Wears glasses Wears Glasses: At all times Patient Visual Report: Other (comment) (L eye dry and irritated)       Perception     Praxis      Pertinent Vitals/Pain Pain Assessment: 0-10 Pain Score: 6  Pain Location: R foot Pain Descriptors / Indicators: Constant;Discomfort;Grimacing;Guarding Pain Intervention(s): Monitored during session;Limited activity within patient's tolerance;Repositioned     Hand Dominance Right   Extremity/Trunk Assessment Upper Extremity Assessment Upper Extremity Assessment: RUE deficits/detail RUE Deficits / Details: Oct 13, 2019 for carpal tunnet - has residual  numbness LUE Deficits / Details: Weakness and numbness at LUE. Noting edema. Unable to perform finger opposition. Able to perform composite extension at digits (slowly) but no composite flexion past neutral position.  No isolated movement at digits. Able to perform wrist, elbow, and shoulder ROM with increased time and effort. 2-/5 for grasp; very weak LUE Sensation: decreased light touch;decreased proprioception LUE Coordination: decreased fine motor;decreased gross motor   Lower Extremity Assessment Lower Extremity Assessment: Defer to PT evaluation   Cervical / Trunk Assessment Cervical / Trunk Assessment: Kyphotic   Communication Communication Communication: No difficulties   Cognition Arousal/Alertness: Awake/alert Behavior During Therapy: WFL for tasks assessed/performed Overall Cognitive Status: Impaired/Different from baseline Area of Impairment: Problem solving                             Problem Solving: Slow processing General Comments: Pt very motivated and agreeable to therapy. Aware of deficits. Requiring increased time for processing; also may be due to lack of sleep   General Comments       Exercises     Shoulder Instructions      Home Living Family/patient expects to be discharged to:: Private residence Living Arrangements: Spouse/significant other Available Help at Discharge: Family;Available 24 hours/day Type of Home: House Home Access: Stairs to enter [employer] of Steps: 1, 1, 4 - on a hill Entrance Stairs-Rails: None Home Layout: One level;Other (Comment) (basement)     Bathroom Shower/Tub: Tub/shower unit   Bathroom Toilet: Handicapped height     Home Equipment: Grab bars - tub/shower;Crutches;Walker - 2 wheels;Cane - single point          Prior Functioning/Environment Level of Independence: Independent        Comments: ADLs, IADLs, driving, and playing tennis once a week        OT Problem List: Decreased strength;Decreased range of motion;Decreased activity tolerance;Impaired balance (sitting and/or standing);Decreased cognition;Decreased safety awareness;Decreased knowledge of use of DME or AE;Decreased knowledge of  precautions;Impaired UE functional use;Pain      OT Treatment/Interventions: Self-care/ADL training;Therapeutic exercise;Energy conservation;DME and/or AE instruction;Therapeutic activities;Patient/family education    OT Goals(Current goals can be found in the care plan section) Acute Rehab OT Goals Patient Stated Goal: Get back to normal OT Goal Formulation: With patient Time For Goal Achievement: 11/05/20 Potential to Achieve Goals: Good  OT Frequency: Min 3X/week   Barriers to D/C:            Co-evaluation PT/OT/SLP Co-Evaluation/Treatment:  (Dove tail)            AM-PAC OT "6 Clicks" Daily Activity     Outcome Measure Help from another person eating meals?: A Little Help from another person taking care of personal grooming?: A Little Help from another person toileting, which includes using toliet, bedpan, or urinal?: A Little Help from another person bathing (including washing, rinsing, drying)?: A Little Help from another person to put on and taking off regular upper body clothing?: A Little Help from another person to put on and taking off regular lower body clothing?: A Little 6 Click Score: 18   End of Session Equipment Utilized During Treatment: Rolling walker;Gait belt Nurse Communication: Mobility status  Activity Tolerance: Patient tolerated treatment well Patient left:  (In hallway with PT)  OT Visit Diagnosis: Unsteadiness on feet (R26.81);Other abnormalities of gait and mobility (R26.89);Muscle weakness (generalized) (M62.81);Pain;Hemiplegia and hemiparesis Hemiplegia - Right/Left: Left Hemiplegia - dominant/non-dominant: Non-Dominant Hemiplegia - caused by: Cerebral infarction  Pain - Right/Left: Right Pain - part of body: Ankle and joints of foot                Time: 3567-0141 OT Time Calculation (min): 22 min Charges:  OT General Charges $OT Visit: 1 Visit OT Evaluation $OT Eval Moderate Complexity: 1 Mod  Melisa Donofrio MSOT, OTR/L Acute  Rehab Pager: [phone] Office: [phone]  Theodoro Grist Euva Rundell 10/22/2020, 9:22 AM

## 2020-10-22 NOTE — TOC Initial Note (Signed)
Transition of Care [facility]) - Initial/Assessment Note    Patient Details  Name: [name] Short MRN: [id number] Date of Birth: [birth year]  Transition of Care [facility]) CM/SW Contact:    Pollie Friar, RN Phone Number: 10/22/2020, 1:45 PM  Clinical Narrative:                 Recommendations are for outpatient therapy. CM met with the patient and his spouse and they are in agreement with [facility]. Orders in Epic and information on the AVS.  Wife and spouse deny medication or transportation needs.  Wife able to provide needed supervision at home.  TOC following.  Expected Discharge Plan: OP Rehab Barriers to Discharge: Continued Medical Work up   Patient Goals and CMS Choice   CMS Medicare.gov Compare Post Acute Care list provided to:: Patient Choice offered to / list presented to : [facility]  Expected Discharge Plan and Services Expected Discharge Plan: OP Rehab   Discharge Planning Services: CM Consult   Living arrangements for the past 2 months: Single Family Home                                      Prior Living Arrangements/Services Living arrangements for the past 2 months: Single Family Home Lives with:: Spouse Patient language and need for interpreter reviewed:: Yes Do you feel safe going back to the place where you live?: Yes      Need for Family Participation in Patient Care: Yes (Comment) Care giver support system in place?: Yes (comment)   Criminal Activity/Legal Involvement Pertinent to Current Situation/Hospitalization: No - Comment as needed  Activities of Daily Living Home Assistive Devices/Equipment: Eyeglasses ADL Screening (condition at time of admission) Patient's cognitive ability adequate to safely complete daily activities?: Yes Is the patient deaf or have difficulty hearing?: No Does the patient have difficulty seeing, even when wearing glasses/contacts?: No Does the patient have difficulty concentrating, remembering, or  making decisions?: No Patient able to express need for assistance with ADLs?: Yes Does the patient have difficulty dressing or bathing?: Yes Independently performs ADLs?: Yes (appropriate for developmental age) Does the patient have difficulty walking or climbing stairs?: No Weakness of Legs: None Weakness of Arms/Hands: Left  Permission Sought/Granted                  Emotional Assessment Appearance:: Appears stated age Attitude/Demeanor/Rapport: Engaged Affect (typically observed): Accepting Orientation: : Oriented to Self,Oriented to Place,Oriented to  Time,Oriented to Situation   Psych Involvement: No (comment)  Admission diagnosis:  CVA (cerebral vascular accident) ([place]) [I63.9] Cerebrovascular accident (CVA), unspecified mechanism ([place]) [I63.9] Patient Active Problem List   Diagnosis Date Noted  . CVA (cerebral vascular accident) ([place]) 10/21/2020  . Bilateral primary osteoarthritis of knee 02/15/2020  . Acquired trigger finger of right little finger 11/03/2019  . Encounter for orthopedic follow-up care 10/13/2019  . H/O total hip arthroplasty, bilateral 10/24/2018  . Gout 08/18/2017  . Overweight (BMI 25.0-29.9) 01/23/2017  . Foot pain, left 02/11/2016  . Sebaceous cyst 07/14/2014  . Sun-damaged skin 07/14/2014  . 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/14/2014  . Physical exam, annual 05/17/2012  . Inguinal hernia 08/12/2010  . 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 TOBACCO USER 05/01/2010  . Paroxysmal a-fib ([place]) 05/01/2010  . ABNORMAL ELECTROCARDIOGRAM 03/26/2010   PCP:  Midge Minium, MD Pharmacy:   [facility] at [place], [place] -  [address] [place] [address] Phone: [phone] Fax: [phone]     Social Determinants of Health (SDOH) Interventions    Readmission Risk Interventions No flowsheet data found.  

## 2020-10-22 NOTE — Evaluation (Signed)
Physical Therapy Evaluation Patient Details Name: [name] Short MRN: [id number] DOB: 1946/05/28 Today's Date: 10/22/2020   History of Present Illness  75 yo male presenting with left facial droop and left arm weakness. MRI showing moderate acute infarct in the posterior right frontal lobe. PMH including HLD, gout, OA, BPH, and recent bil inguinal hernia repair 10/17/19.  Clinical Impression  Patient presents with left hemiparesis, slurred speech, pain in right foot, impaired sensation/coordination and impaired mobility s/p above. Pt independent PTA and lives with wife; very active playing tennis once/week and driving. Today, pt requires Min A for transfers and gait training with use of RW for support. Noted to have right foot pain for the past few days-reports wearing only sandals since hernia surgery last week but also reports hx of gout. Concerned about speech and chewing/swallowing. Would benefit from neuro OPPT to improve overall strength and mobility as pt highly active and independent prior to this stroke. Will follow acutely to maximize independence and mobility prior to return home.    Follow Up Recommendations Outpatient PT    Equipment Recommendations  None recommended by PT    Recommendations for Other Services       Precautions / Restrictions Precautions Precautions: Fall Restrictions Weight Bearing Restrictions: No      Mobility  Bed Mobility Overal bed mobility: Needs Assistance Bed Mobility: Supine to Sit     Supine to sit: Supervision     General bed mobility comments: Sitting EOB with OT upon arrival.    Transfers Overall transfer level: Needs assistance Equipment used: Rolling walker (2 wheeled) Transfers: Sit to/from Stand Sit to Stand: Min guard         General transfer comment: MIn Guard A for safety. No physical A needed. Stood from [employer], from toilet x1. Cues to use LUE for WB during transitions.  Ambulation/Gait Ambulation/Gait  assistance: Min guard Gait Distance (Feet): 120 Feet Assistive device: Rolling walker (2 wheeled) Gait Pattern/deviations: Step-through pattern;Decreased stance time - right;Decreased step length - left Gait velocity: decreased Gait velocity interpretation: <1.31 ft/sec, indicative of household ambulator General Gait Details: SLow, mostly steady gait with pain with WB on RLE resulting in decreased stance time. Able to keep left hand on RW.  Stairs            Wheelchair Mobility    Modified Rankin (Stroke Patients Only) Modified Rankin (Stroke Patients Only) Pre-Morbid Rankin Score: No symptoms Modified Rankin: Moderately severe disability     Balance Overall balance assessment: Needs assistance Sitting-balance support: Feet supported;No upper extremity supported Sitting balance-Leahy Scale: Good     Standing balance support: During functional activity Standing balance-Leahy Scale: Fair Standing balance comment: Able to stand at sinka nd wash face without LOB.                             Pertinent Vitals/Pain Pain Assessment: 0-10 Pain Score: 6  Pain Location: Rt foot Pain Descriptors / Indicators: Constant;Discomfort;Grimacing;Guarding Pain Intervention(s): Monitored during session;Repositioned;Limited activity within patient's tolerance    Home Living Family/patient expects to be discharged to:: Private residence Living Arrangements: Spouse/significant other Available Help at Discharge: Family;Available 24 hours/day Type of Home: House Home Access: Stairs to enter Entrance Stairs-Rails: None Entrance Stairs-Number of Steps: 1, 1, 4 - on a hill Home Layout: One level;Other (Comment) Home Equipment: Grab bars - tub/shower;Crutches;Walker - 2 wheels;Cane - single point      Prior Function Level of Independence: Independent  Comments: ADLs, IADLs, driving, and playing tennis once a week     Hand Dominance   Dominant Hand: Right     Extremity/Trunk Assessment   Upper Extremity Assessment Upper Extremity Assessment: Defer to OT evaluation RUE Deficits / Details: Oct 13, 2019 for carpal tunnet - has residual numbness LUE Deficits / Details: Weakness and numbness at LUE. Noting edema. Unable to perform finger opposition. Able to perform composite extension at digits (slowly) but no composite flexion past neutral position. No isolated movement at digits. Able to perform wrist, elbow, and shoulder ROM with increased time and effort. 2-/5 for grasp; very weak LUE Sensation: decreased light touch;decreased proprioception LUE Coordination: decreased fine motor;decreased gross motor    Lower Extremity Assessment Lower Extremity Assessment: Generalized weakness (but functional; reports right foot pain along plantar aspect as well as medial ankle)    Cervical / Trunk Assessment Cervical / Trunk Assessment: Kyphotic  Communication   Communication: Expressive difficulties (slurred speech)  Cognition Arousal/Alertness: Awake/alert Behavior During Therapy: WFL for tasks assessed/performed Overall Cognitive Status: Impaired/Different from baseline Area of Impairment: Problem solving                             Problem Solving: Slow processing General Comments: Pt very motivated and agreeable to therapy. Aware of deficits. Requiring increased time for processing; also may be due to lack of sleep      General Comments General comments (skin integrity, edema, etc.): VSS on RA. Some difficulty managing secretions as well as chewing/swallowing. RN notified.    Exercises     Assessment/Plan    PT Assessment Patient needs continued PT services  PT Problem List Decreased strength;Decreased mobility;Pain;Decreased balance;Impaired sensation;Decreased cognition;Decreased coordination;Decreased range of motion       PT Treatment Interventions Therapeutic exercise;DME instruction;Gait training;Balance training;Stair  training;Functional mobility training;Therapeutic activities;Patient/family education;Neuromuscular re-education    PT Goals (Current goals can be found in the Care Plan section)  Acute Rehab PT Goals Patient Stated Goal: Get back to normal PT Goal Formulation: With patient Time For Goal Achievement: 11/05/20 Potential to Achieve Goals: Good    Frequency Min 4X/week   Barriers to discharge        Co-evaluation               AM-PAC PT "6 Clicks" Mobility  Outcome Measure Help needed turning from your back to your side while in a flat bed without using bedrails?: None Help needed moving from lying on your back to sitting on the side of a flat bed without using bedrails?: A Little Help needed moving to and from a bed to a chair (including a wheelchair)?: A Little Help needed standing up from a chair using your arms (e.g., wheelchair or bedside chair)?: A Little Help needed to walk in hospital room?: A Little Help needed climbing 3-5 steps with a railing? : A Little 6 Click Score: 19    End of Session Equipment Utilized During Treatment: Gait belt Activity Tolerance: Patient tolerated treatment well Patient left: in bed;with call bell/phone within reach;with bed alarm set Nurse Communication: Mobility status PT Visit Diagnosis: Hemiplegia and hemiparesis Hemiplegia - Right/Left: Left Hemiplegia - dominant/non-dominant: Non-dominant Hemiplegia - caused by: Cerebral infarction    Time: 3888-2800 PT Time Calculation (min) (ACUTE ONLY): 26 min   Charges:   PT Evaluation $PT Eval Moderate Complexity: 1 Mod          Nohemy Koop H, PT, DPT Acute Rehabilitation Services  Pager [phone] Office [phone]      Blake Divine A Lanier Ensign 10/22/2020, 9:47 AM

## 2020-10-23 ENCOUNTER — Inpatient Hospital Stay (HOSPITAL_COMMUNITY): Payer: Medicare Other

## 2020-10-23 DIAGNOSIS — I6389 Other cerebral infarction: Secondary | ICD-10-CM

## 2020-10-23 LAB — BASIC METABOLIC PANEL
Anion gap: 13 (ref 5–15)
BUN: 15 mg/dL (ref 8–23)
CO2: 23 mmol/L (ref 22–32)
Calcium: 9.2 mg/dL (ref 8.9–10.3)
Chloride: 104 mmol/L (ref 98–111)
Creatinine, Ser: 1.21 mg/dL (ref 0.61–1.24)
GFR, Estimated: >60 mL/min (ref >60)
Glucose, Bld: 107 mg/dL — ABNORMAL HIGH (ref 70–99)
Potassium: 3.7 mmol/L (ref 3.5–5.1)
Sodium: 140 mmol/L (ref 135–145)

## 2020-10-23 LAB — ECHOCARDIOGRAM COMPLETE
Height: 67 in
S' Lateral: 2.5 cm
Weight: 2846.58 oz

## 2020-10-23 LAB — CBC
HCT: 40 % (ref 39.0–52.0)
Hemoglobin: 13.5 g/dL (ref 13.0–17.0)
MCH: 30 pg (ref 26.0–34.0)
MCHC: 33.8 g/dL (ref 30.0–36.0)
MCV: 88.9 fL (ref 80.0–100.0)
Platelets: 288 10*3/uL (ref 150–400)
RBC: 4.5 MIL/uL (ref 4.22–5.81)
RDW: 12 % (ref 11.5–15.5)
WBC: 12.1 10*3/uL — ABNORMAL HIGH (ref 4.0–10.5)
nRBC: 0 % (ref 0.0–0.2)

## 2020-10-23 MED ORDER — DILTIAZEM HCL ER COATED BEADS 120 MG PO CP24: 120.0000 mg | ORAL_CAPSULE | Freq: Every day | ORAL | 0 refills | Status: DC

## 2020-10-23 MED ORDER — APIXABAN 5 MG PO TABS: 5.0000 mg | ORAL_TABLET | Freq: Two times a day (BID) | ORAL | 0 refills | Status: DC

## 2020-10-23 MED ORDER — DILTIAZEM HCL ER COATED BEADS 120 MG PO CP24
120.0000 mg | ORAL_CAPSULE | Freq: Every day | ORAL | Status: DC
  Administered 2020-10-23: 120 mg via ORAL
  Filled 2020-10-23: qty 1

## 2020-10-23 MED ORDER — IBUPROFEN 200 MG PO TABS
600.0000 mg | ORAL_TABLET | Freq: Once | ORAL | Status: AC
  Administered 2020-10-23: 600 mg via ORAL
  Filled 2020-10-23: qty 3

## 2020-10-23 MED ORDER — ACETAMINOPHEN 500 MG PO TABS: 500.0000 mg | ORAL_TABLET | Freq: Four times a day (QID) | ORAL | 0 refills | Status: DC | PRN

## 2020-10-23 MED ORDER — ROSUVASTATIN CALCIUM 20 MG PO TABS: 20.0000 mg | ORAL_TABLET | Freq: Every day | ORAL | 0 refills | Status: DC

## 2020-10-23 NOTE — TOC Transition Note (Signed)
Transition of Care [facility]) - CM/SW Discharge Note   Patient Details  Name: [name] Short MRN: [id number] Date of Birth: [birth year]  [facility]) CM/SW Contact:  Pollie Friar, RN Phone Number: 10/23/2020, 3:38 PM   Clinical Narrative:    Pt discharging home with spouse that can provide needed supervision at home.  Outpatient therapy arranged through [facility] with information on the AVS. Pt will acquire tub bench outside the hospital setting.  30 day free card provided for Eliquis and patient assistance form until pt has met his out of pocket deductible. Wife to provide transport home.   Final next level of care: OP Rehab Barriers to Discharge: No Barriers Identified   Patient Goals and CMS Choice   CMS Medicare.gov Compare Post Acute Care list provided to:: Patient Represenative (must comment) Choice offered to / list presented to : Spouse,Patient  Discharge Placement                       Discharge Plan and Services   Discharge Planning Services: CM Consult                                 Social Determinants of Health (SDOH) Interventions     Readmission Risk Interventions No flowsheet data found.

## 2020-10-23 NOTE — TOC Benefit Eligibility Note (Signed)
Transition of Care [facility]) Benefit Eligibility Note    Patient Details  Name: [name] Short MRN: [id number] Date of Birth: [birth year]   Medication/Dose: ELIQUIS  5 MG BID  Covered?: Yes  Tier:  (TIER- 4 DRUG)  Prescription Coverage Preferred [facility]: [facility]  Spoke with Person/Company/Phone Number:: GAB P. @  [employer] # [phone]  Co-Pay: $ 524.94  Prior Approval: No  Deductible: Unmet (OUT-OF-POCKET:UNMET)       Mardene Sayer Phone Number: 10/23/2020, 9:59 AM

## 2020-10-23 NOTE — Progress Notes (Signed)
Physical Therapy Treatment Patient Details Name: [name] Short MRN: [id number] DOB: 11/21/1945 Today's Date: 10/23/2020    History of Present Illness 75 yo male presenting with left facial droop and left arm weakness. MRI showing moderate acute infarct in the posterior right frontal lobe. PMH including HLD, gout, OA, BPH, and recent bil inguinal hernia repair 10/17/19.    PT Comments    Patient progressing slowly towards PT goals. Reports continued pain in right foot limiting mobility. Requires Min guard assist to stand from all surfaces and slow to rise due to pain. Limited ambulation tolerance today due to pain. Problem solved and discussed how to safely negotiate stairs since pt unable to tolerate this date. Recommend scooting up on bottom and having neighbor assist with the last step vs ascending with strong LE with assist pending pain level. Pt agreeable and understands. Speech seems slightly improved today as well as grip of left hand. Will continue to follow and progress.     Follow Up Recommendations  Outpatient PT (neuro)     Equipment Recommendations  None recommended by PT    Recommendations for Other Services       Precautions / Restrictions Precautions Precautions: Fall Precaution Comments: right foot pain likely gout? Restrictions Weight Bearing Restrictions: No    Mobility  Bed Mobility Overal bed mobility: Needs Assistance Bed Mobility: Supine to Sit;Sit to Supine     Supine to sit: Supervision;HOB elevated Sit to supine: Supervision;HOB elevated   General bed mobility comments: Supervisionf or safety.  Transfers Overall transfer level: Needs assistance Equipment used: Rolling walker (2 wheeled) Transfers: Sit to/from Stand Sit to Stand: Min guard         General transfer comment: MIn Guard A for safety. No physical A needed. Slow to rise today due to pain in right foot. Stood from [employer]. Cues to use LUE for WB during  transitions.  Ambulation/Gait Ambulation/Gait assistance: Min guard Gait Distance (Feet): 60 Feet Assistive device: Rolling walker (2 wheeled) Gait Pattern/deviations: Step-through pattern;Decreased stance time - right;Decreased step length - left Gait velocity: decreased Gait velocity interpretation: <1.31 ft/sec, indicative of household ambulator General Gait Details: SLow, mostly steady gait with pain with WB on RLE resulting in decreased stance time. Able to keep left hand on RW today with better grip. Heavy reliance on UEs needing 2 standing rest breaks to shake out hands. HR up to 130 bpm. Limited by pain in foot.   Stairs Stairs:  (Discussed stair negotiation techniques- scooting up on bottom and having neighbor assist with last step vs leading with strong leg.)           Wheelchair Mobility    Modified Rankin (Stroke Patients Only) Modified Rankin (Stroke Patients Only) Pre-Morbid Rankin Score: No symptoms Modified Rankin: Moderately severe disability     Balance Overall balance assessment: Needs assistance Sitting-balance support: Feet supported;No upper extremity supported Sitting balance-Leahy Scale: Good     Standing balance support: During functional activity Standing balance-Leahy Scale: Fair Standing balance comment: Able to stand at sinka nd wash face without LOB but needs UE support for walking due to right foot pain.                            Cognition Arousal/Alertness: Awake/alert Behavior During Therapy: WFL for tasks assessed/performed Overall Cognitive Status: Within Functional Limits for tasks assessed  Exercises      General Comments General comments (skin integrity, edema, etc.): HR up to 130 bpm, did not note any secretion difficulties this session.      Pertinent Vitals/Pain Pain Assessment: 0-10 Pain Score: 7  Pain Location: Rt foot Pain Descriptors / Indicators:  Constant;Discomfort;Grimacing;Guarding Pain Intervention(s): Ice applied;Monitored during session;Limited activity within patient's tolerance;Repositioned;RN gave pain meds during session    Home Living                      Prior Function            PT Goals (current goals can now be found in the care plan section) Progress towards PT goals: Progressing toward goals (slowly)    Frequency    Min 4X/week      PT Plan Current plan remains appropriate    Co-evaluation              AM-PAC PT "6 Clicks" Mobility   Outcome Measure  Help needed turning from your back to your side while in a flat bed without using bedrails?: None Help needed moving from lying on your back to sitting on the side of a flat bed without using bedrails?: None Help needed moving to and from a bed to a chair (including a wheelchair)?: A Little Help needed standing up from a chair using your arms (e.g., wheelchair or bedside chair)?: A Little Help needed to walk in hospital room?: A Little Help needed climbing 3-5 steps with a railing? : A Little 6 Click Score: 20    End of Session Equipment Utilized During Treatment: Gait belt Activity Tolerance: Patient limited by pain Patient left: in bed;with call bell/phone within reach;with bed alarm set Nurse Communication: Mobility status PT Visit Diagnosis: Hemiplegia and hemiparesis;Pain Hemiplegia - Right/Left: Left Hemiplegia - dominant/non-dominant: Non-dominant Pain - Right/Left: Right Pain - part of body: Ankle and joints of foot     Time: 0855-0920 PT Time Calculation (min) (ACUTE ONLY): 25 min  Charges:  $Gait Training: 23-37 mins                     Vale Haven, PT, DPT Acute Rehabilitation Services Pager [phone] Office [phone]       Blake Divine A Lanier Ensign 10/23/2020, 9:59 AM

## 2020-10-23 NOTE — Progress Notes (Signed)
Discharged to home and discharge instructions given.  Transported to vehicle in w/c

## 2020-10-23 NOTE — Progress Notes (Signed)
   10/23/20 1120  Clinical Encounter Type  Visited With Patient not available  Visit Type Follow-up  Referral From Nurse  Consult/Referral To Chaplain   Chaplain attempted AD education again. Medical team in with Pt. Chaplain remains available.  This note was prepared by Chaplain Resident, Tacy Learn, MDiv. Chaplain remains available as needed through the on-call pager: [phone].

## 2020-10-23 NOTE — Evaluation (Signed)
Speech Language Pathology Evaluation Patient Details Name: [name] Short MRN: [id number] DOB: [birth year] Today's Date: 10/23/2020 Time: 1000-1025 SLP Time Calculation (min) (ACUTE ONLY): 25 min  Problem List:  Patient Active Problem List   Diagnosis Date Noted  . CVA (cerebral vascular accident) (HCC) 10/21/2020  . Bilateral primary osteoarthritis of knee 02/15/2020  . Acquired trigger finger of right little finger 11/03/2019  . Encounter for orthopedic follow-up care 10/13/2019  . H/O total hip arthroplasty, bilateral 10/24/2018  . Gout 08/18/2017  . Overweight (BMI 25.0-29.9) 01/23/2017  . Foot pain, left 02/11/2016  . Sebaceous cyst 07/14/2014  . Sun-damaged skin 07/14/2014  . 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/14/2014  . Physical exam, annual 05/17/2012  . Inguinal hernia 08/12/2010  . 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 TOBACCO USER 05/01/2010  . Paroxysmal a-fib (HCC) 05/01/2010  . ABNORMAL ELECTROCARDIOGRAM 03/26/2010   Past Medical History:  Past Medical History:  Diagnosis Date  . Arthritis    WRIST  . Benign localized prostatic hyperplasia with lower urinary tract symptoms (LUTS)   . Complication of anesthesia    slow to wake  . ED (erectile dysfunction)   . GERD (gastroesophageal reflux disease)   . History of concussion    AS CHILD--  NO RESIDUAL  . History of gout   . History of kidney stones   . Hyperlipidemia   . Inguinal hernia, bilateral   . Nephrolithiasis    BILATERAL  . PAF (paroxysmal atrial fibrillation) (HCC) currently being followed by pcp   EPISODE --  2011  FOLLOW-UP W/ DR WALL  /  HAS NOT SEEN ANY CARDIOLOGIST SINCE   Past Surgical History:  Past Surgical History:  Procedure Laterality Date  . APPENDECTOMY  1983  . CARDIOVASCULAR STRESS TEST  05-08-2010  DR WALL   NO EVIDENCE OF SCAR OR ISCHEMIA/ EF 54%/  PT HAD BOTH AFIB/ AFLUTTER DURING STUDY  . CARPAL TUNNEL RELEASE Right 01/ 14/ 2021  . CYSTOSCOPY W/ URETERAL STENT PLACEMENT Left  09/20/2013   Procedure: CYSTOSCOPY WITH STENT REPLACEMENT;  Surgeon: Antony Haste, MD;  Location: [facility];  Service: Urology;  Laterality: Left;  . CYSTOSCOPY WITH URETEROSCOPY Left 09/20/2013   Procedure: CYSTOSCOPY WITH URETEROSCOPY;  Surgeon: Antony Haste, MD;  Location: [facility];  Service: Urology;  Laterality: Left;  . CYSTOSCOPY WITH URETEROSCOPY AND STENT PLACEMENT Left 07/12/2013   Procedure: CYSTOSCOPY WITH LITHOLAPEXY, LEFT URETERAL STENT PLACEMENT;  Surgeon: Antony Haste, MD;  Location: [facility];  Service: Urology;  Laterality: Left;  . CYSTOSCOPY WITH URETEROSCOPY AND STENT PLACEMENT Bilateral 06/23/2014   Procedure: BILATERAL URETEROSCOPY, HOLMIUM LASER LITHOTRIPSY AND STENT PLACEMENT, RIGHT ;  Surgeon: Jerilee Field, MD;  Location: [facility];  Service: Urology;  Laterality: Bilateral;  . EXTRACORPOREAL SHOCK WAVE LITHOTRIPSY  X2  . EXTRACORPOREAL SHOCK WAVE LITHOTRIPSY Left 08-29-2013;   07-28-2013  . EXTRACORPOREAL SHOCK WAVE LITHOTRIPSY Right 12/09/2018   Procedure: EXTRACORPOREAL SHOCK WAVE LITHOTRIPSY (ESWL);  Surgeon: Jerilee Field, MD;  Location: [facility];  Service: Urology;  Laterality: Right;  . HOLMIUM LASER APPLICATION Left 09/20/2013   Procedure: HOLMIUM LASER APPLICATION;  Surgeon: Antony Haste, MD;  Location: [facility];  Service: Urology;  Laterality: Left;  . INGUINAL HERNIA REPAIR Bilateral 10/16/2020   Procedure: BILATERAL OPEN INGUINAL HERNIA REPAIR WITH MESH;  Surgeon: Abigail Miyamoto, MD;  Location: [facility];  Service: General;  Laterality: Bilateral;  LMA/TAP BLOCK  . LAPAROSCOPIC  INGUINAL HERNIA REPAIR Bilateral 09-04-2010   w/ mesh  . TONSILLECTOMY  AS CHILD  . 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 TOTAL HIP ARTHROPLASTY Right 11/15/2012   Procedure: RIGHT TOTAL HIP ARTHROPLASTY ANTERIOR APPROACH;   Surgeon: Eldred Manges, MD;  Location: [facility];  Service: Orthopedics;  Laterality: Right;  Right total hip arthroplasty  . TRANSTHORACIC ECHOCARDIOGRAM  05-08-2010   NORMAL LVSF/  EF 55%/  GRADE I DIASTOLIC DYSFUNCTION/  MILD BILATERAL ATRIUM ENLARGEMENT   HPI:  75 yo male presenting with left facial droop and left arm weakness. MRI showing moderate acute infarct in the posterior right frontal lobe. PMH including HLD, gout, OA, BPH, and recent bil inguinal hernia repair 10/17/19   Assessment / Plan / Recommendation Clinical Impression  Pt presents with a novel dysarthria of speech. Cognitive abilities appeared grossly within functional limits. Pt and spouse both deny changes from baseline cognitive functioning. Pt with slowed speaking rate (though states speech has always been somewhat slowed), reduced articulation of sounds, and decreased vocal intensity. Despite deficits speech remained largely intelligible. Verbal expression and auditory comprehension appeared intact. Pt was independent with all ADLs at baseline and will have support from family 24 hours at discharge. Recommend outpatient SLP follow up. SLP will follow during acute stay.    SLP Assessment  SLP Recommendation/Assessment: Patient needs continued Speech Lanaguage Pathology Services SLP Visit Diagnosis: Dysarthria and anarthria (R47.1)    Follow Up Recommendations  Outpatient SLP    Frequency and Duration min 2x/week  1 week      SLP Evaluation Cognition  Overall Cognitive Status: Within Functional Limits for tasks assessed (grossly appeared within functional limits) Arousal/Alertness: Awake/alert Orientation Level: Oriented X4 Memory: Appears intact Awareness: Appears intact Problem Solving: Appears intact Executive Function: Reasoning;Organizing Reasoning: Appears intact Organizing: Appears intact Safety/Judgment: Appears intact       Comprehension  Auditory Comprehension Overall Auditory Comprehension: Appears  within functional limits for tasks assessed    Expression Expression Primary Mode of Expression: Verbal Verbal Expression Overall Verbal Expression: Appears within functional limits for tasks assessed Written Expression Dominant Hand: Right   Oral / Motor  Oral Motor/Sensory Function Overall Oral Motor/Sensory Function: Moderate impairment Facial ROM: Reduced left;Suspected CN VII (facial) dysfunction Facial Symmetry: Abnormal symmetry left;Suspected CN VII (facial) dysfunction Facial Strength: Reduced left;Suspected CN VII (facial) dysfunction Facial Sensation: Reduced left;Suspected CN V (Trigeminal) dysfunction Lingual ROM: Reduced left Lingual Symmetry: Within Functional Limits Lingual Strength: Reduced;Suspected CN XII (hypoglossal) dysfunction Velum: Within Functional Limits Motor Speech Overall Motor Speech: Impaired Respiration: Impaired Level of Impairment: Conversation Resonance: Within functional limits Articulation: Impaired Level of Impairment: Word Intelligibility: Intelligible Motor Planning: Witnin functional limits Effective Techniques: Slow rate;Increased vocal intensity;Over-articulate;Pause   GO                    Ardyth Gal MA, CCC-SLP Acute Rehabilitation Services   10/23/2020, 10:42 AM

## 2020-10-23 NOTE — Progress Notes (Signed)
  Echocardiogram 2D Echocardiogram has been performed.  Delcie Roch 10/23/2020, 2:10 PM

## 2020-10-23 NOTE — Evaluation (Signed)
Clinical/Bedside Swallow Evaluation Patient Details  Name: [name] Short MRN: [id number] Date of Birth: [birth year]  Today's Date: 10/23/2020 Time: SLP Start Time (ACUTE ONLY): 0944 SLP Stop Time (ACUTE ONLY): 1000 SLP Time Calculation (min) (ACUTE ONLY): 16 min  Past Medical History:  Past Medical History:  Diagnosis Date  . Arthritis    WRIST  . Benign localized prostatic hyperplasia with lower urinary tract symptoms (LUTS)   . Complication of anesthesia    slow to wake  . ED (erectile dysfunction)   . GERD (gastroesophageal reflux disease)   . History of concussion    AS CHILD--  NO RESIDUAL  . History of gout   . History of kidney stones   . Hyperlipidemia   . Inguinal hernia, bilateral   . Nephrolithiasis    BILATERAL  . PAF (paroxysmal atrial fibrillation) (HCC) currently being followed by pcp   EPISODE --  2011  FOLLOW-UP W/ DR WALL  /  HAS NOT SEEN ANY CARDIOLOGIST SINCE   Past Surgical History:  Past Surgical History:  Procedure Laterality Date  . APPENDECTOMY  1983  . CARDIOVASCULAR STRESS TEST  05-08-2010  DR WALL   NO EVIDENCE OF SCAR OR ISCHEMIA/ EF 54%/  PT HAD BOTH AFIB/ AFLUTTER DURING STUDY  . CARPAL TUNNEL RELEASE Right 01/ 14/ 2021  . CYSTOSCOPY W/ URETERAL STENT PLACEMENT Left 09/20/2013   Procedure: CYSTOSCOPY WITH STENT REPLACEMENT;  Surgeon: Antony Haste, MD;  Location: [facility];  Service: Urology;  Laterality: Left;  . CYSTOSCOPY WITH URETEROSCOPY Left 09/20/2013   Procedure: CYSTOSCOPY WITH URETEROSCOPY;  Surgeon: Antony Haste, MD;  Location: [facility];  Service: Urology;  Laterality: Left;  . CYSTOSCOPY WITH URETEROSCOPY AND STENT PLACEMENT Left 07/12/2013   Procedure: CYSTOSCOPY WITH LITHOLAPEXY, LEFT URETERAL STENT PLACEMENT;  Surgeon: Antony Haste, MD;  Location: [facility];  Service: Urology;  Laterality: Left;  . CYSTOSCOPY WITH URETEROSCOPY AND STENT  PLACEMENT Bilateral 06/23/2014   Procedure: BILATERAL URETEROSCOPY, HOLMIUM LASER LITHOTRIPSY AND STENT PLACEMENT, RIGHT ;  Surgeon: Jerilee Field, MD;  Location: [facility];  Service: Urology;  Laterality: Bilateral;  . EXTRACORPOREAL SHOCK WAVE LITHOTRIPSY  X2  . EXTRACORPOREAL SHOCK WAVE LITHOTRIPSY Left 08-29-2013;   07-28-2013  . EXTRACORPOREAL SHOCK WAVE LITHOTRIPSY Right 12/09/2018   Procedure: EXTRACORPOREAL SHOCK WAVE LITHOTRIPSY (ESWL);  Surgeon: Jerilee Field, MD;  Location: [facility];  Service: Urology;  Laterality: Right;  . HOLMIUM LASER APPLICATION Left 09/20/2013   Procedure: HOLMIUM LASER APPLICATION;  Surgeon: Antony Haste, MD;  Location: [facility];  Service: Urology;  Laterality: Left;  . INGUINAL HERNIA REPAIR Bilateral 10/16/2020   Procedure: BILATERAL OPEN INGUINAL HERNIA REPAIR WITH MESH;  Surgeon: Abigail Miyamoto, MD;  Location: [facility];  Service: General;  Laterality: Bilateral;  LMA/TAP BLOCK  . LAPAROSCOPIC INGUINAL HERNIA REPAIR Bilateral 09-04-2010   w/ mesh  . TONSILLECTOMY  AS CHILD  . 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 TRANSTHORACIC ECHOCARDIOGRAM  05-08-2010   NORMAL LVSF/  EF 55%/  GRADE I DIASTOLIC DYSFUNCTION/  MILD BILATERAL ATRIUM ENLARGEMENT   HPI:  75 yo male presenting with left facial droop and left arm weakness. MRI showing moderate acute infarct in the posterior right frontal lobe.  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 Pt with left oral motor deficits including reduced lingual, facial, and labial strength  and ROM. He notes reduced left facial sensation as well. Pt had been utilizing strategies to compensate new swallowing deficits including bolus placement on right side of oral cavity, lingual sweep, small sips and bites, and liquid alternation.  Pt states appetite has been reduced. Spouse present who has assisted with ordering softer items from the menu. They do not wish to have diet modification at this time. Pt with suspected delay in swallow initiation per palpation. Isolated instance of delayed coughing exhibited following consecutive swallows of thin liquids via straw. Recommend continue regular diet (with pt and family preference to choose softer solids) with thin liquids and medicines in puree with safe swallowing strategies. Recommend outpatient MBSS follow up. SLP will follow acutely during stay, pt and spouse state possible discharge this date.  SLP Visit Diagnosis: Dysphagia, oral phase (R13.11);Dysphagia, unspecified (R13.10)    Aspiration Risk  Mild aspiration risk;Moderate aspiration risk    Diet Recommendation   Regular thin liquids   Medication Administration: Whole meds with puree    Other  Recommendations Oral Care Recommendations: Oral care BID   Follow up Recommendations Outpatient SLP , Outpatient MBSS      Frequency and Duration min 2x/week  1 week       Prognosis Prognosis for Safe Diet Advancement: Good      Swallow Study   General Date of Onset: 10/20/20 HPI: 75 yo male presenting with left facial droop and left arm weakness. MRI showing moderate acute infarct in the posterior right frontal lobe. PMH including HLD, gout, OA, BPH, and recent bil inguinal hernia repair 10/17/19 Type of Study: Bedside Swallow Evaluation Previous Swallow Assessment: none on file Diet Prior to this Study: Regular;Thin liquids Temperature Spikes Noted: No Respiratory Status: Room air History of Recent Intubation: No Behavior/Cognition: Alert;Cooperative;Pleasant mood Oral Cavity  Assessment: Within Functional Limits Oral Care Completed by SLP: No Vision: Functional for self-feeding Self-Feeding Abilities: Needs set up;Needs assist Patient Positioning: Upright in bed Baseline Vocal Quality: Low vocal intensity Volitional Cough: Strong Volitional Swallow: Able to elicit    Oral/Motor/Sensory Function Overall Oral Motor/Sensory Function: Moderate impairment Facial ROM: Reduced left;Suspected CN VII (facial) dysfunction Facial Symmetry: Abnormal symmetry left;Suspected CN VII (facial) dysfunction Facial Strength: Reduced left;Suspected CN VII (facial) dysfunction Facial Sensation: Reduced left;Suspected CN V (Trigeminal) dysfunction Lingual ROM: Reduced left Lingual Symmetry: Within Functional Limits Lingual Strength: Reduced;Suspected CN XII (hypoglossal) dysfunction   Ice Chips Ice chips: Impaired Presentation: Spoon Oral Phase Impairments: Impaired mastication Oral Phase Functional Implications: Prolonged oral transit Pharyngeal Phase Impairments: Suspected delayed Swallow;Multiple swallows   Thin Liquid Thin Liquid: Impaired Presentation: Cup;Straw Oral Phase Functional Implications: Prolonged oral transit Pharyngeal  Phase Impairments: Suspected delayed Swallow;Multiple swallows;Cough - Delayed    Nectar Thick Nectar Thick Liquid: Not tested   Honey Thick Honey Thick Liquid: Not tested   Puree Puree: Impaired Presentation: Spoon Oral Phase Functional Implications: Prolonged oral transit Pharyngeal Phase Impairments: Suspected delayed Swallow;Multiple swallows   Solid     Solid: Impaired Presentation: Self Fed Oral Phase Impairments: Impaired mastication;Reduced lingual movement/coordination Oral Phase Functional Implications: Prolonged oral transit;Oral residue Pharyngeal Phase Impairments: Suspected delayed Swallow;Multiple swallows      Will Heinkel H. MA, CCC-SLP Acute Rehabilitation Services   10/23/2020,10:29 AM

## 2020-10-23 NOTE — Discharge Summary (Signed)
Physician Discharge Summary  [name] Short:[id number] DOB: 06/16/1946 DOA: 10/21/2020  PCP: Sheliah Hatch, MD  Admit date: 10/21/2020 Discharge date: 10/23/2020  Time spent: 35 minutes  Recommendations for Outpatient Follow-up:  1. [facility] in 4 weeks 2. Outpatient PT OT and speech therapy 3. Cardiology, [facility] in 1 month for atrial fibrillation   Discharge Diagnoses:  Acute embolic stroke Paroxysmal atrial fibrillation History of gout Dysarthria Mild dysphagia Dyslipidemia Recent hernia repair   HYPERLIPIDEMIA TYPE I / IV   Paroxysmal a-fib (HCC)   Gout   CVA (cerebral vascular accident) [facility])   Discharge Condition: Stable  Diet recommendation: Low-sodium, heart healthy  Filed Weights   10/21/20 2214  Weight: 80.7 kg    History of present illness:  75 year old male with history of atrial fibrillation not on anticoagulation, dyslipidemia, hypertension, gout, GERD, underwent inguinal hernia repair on 1/18, EKG then revealed A. fib who presented to the ED with slurred speech, left hand weakness on 1/23  Hospital Course:    Acute CVA -MRI brain notes Moderate acute infarct in the posterior right frontal lobe -He has residual left forearm, hand weakness, facial droop and dysarthria -2D echocardiogram noted preserved EF -Per neurology this is likely embolic stroke due to atrial fibrillation -CTA head and neck noted high-grade narrowing of right M3 branch with nonopacification distally -LDL is 98, hemoglobin A1c is 5.6 -Seen by neurology in consultation, recommended Eliquis 5 mg twice daily, aspirin was discontinued, also started on low-dose statin -PT OT, SLP evaluations completed, recommended outpatient PT OT and speech therapy, this has been set up -Follow-up with [facility] neurology in 4 weeks  Paroxysmal atrial fibrillation -With paroxysms of A. fib and sinus rhythm -Heart rate in the 90-100 range while in A. fib, started on  low-dose Cardizem -Also started on Eliquis as noted above, referral sent to [facility]  History of gout -Stable, on colchicine as needed  Recent inguinal hernia repair -Follow-up with surgeon  Discharge Exam: Vitals:   10/23/20 0906 10/23/20 1100  BP: 105/81 108/79  Pulse: (!) 102 97  Resp: 19   Temp: 98.5 F (36.9 C) 97.9 F (36.6 C)  SpO2: 98% 97%    General: Awake alert oriented x2, dysarthria noted Cardiovascular: S1-S2, irregularly irregular rhythm Respiratory: Clear  Discharge Instructions   Discharge Instructions    Ambulatory referral to Cardiology   Complete by: As directed    For Afib   Ambulatory referral to Neurology   Complete by: As directed    An appointment is requested in approximately: 4-6 weeks with Dr. Pearlean Brownie or [facility] NP.   Ambulatory referral to Occupational Therapy   Complete by: As directed    Ambulatory referral to Physical Therapy   Complete by: As directed    Ambulatory referral to Speech Therapy   Complete by: As directed    Diet - low sodium heart healthy   Complete by: As directed    Diet - low sodium heart healthy   Complete by: As directed    Increase activity slowly   Complete by: As directed    Increase activity slowly   Complete by: As directed    No dressing needed   Complete by: As directed    No wound care   Complete by: As directed      Allergies as of 10/23/2020      Reactions   Shellfish-derived Products Other (See Comments)   Gout   Warfarin Sodium Rash  Medication List    STOP taking these medications   aspirin 325 MG EC tablet   ibuprofen 200 MG tablet Commonly known as: ADVIL   naproxen sodium 220 MG tablet Commonly known as: ALEVE     TAKE these medications   acetaminophen 500 MG tablet Commonly known as: TYLENOL Take 1 tablet (500 mg total) by mouth every 6 (six) hours as needed.   apixaban 5 MG Tabs tablet Commonly known as: ELIQUIS Take 1 tablet (5 mg total) by mouth 2 (two)  times daily.   Blink Tears 0.25 % Soln Generic drug: Polyethylene Glycol 400 Place 1 drop into both eyes 2 (two) times daily as needed (for dryness or irritation).   calcium carbonate 500 MG chewable tablet Commonly known as: TUMS - dosed in mg elemental calcium Chew 1 tablet by mouth as needed for indigestion or heartburn.   Colchicine 0.6 MG Caps Take 0.6 mg by mouth daily as needed (for gout flares).   diltiazem 120 MG 24 hr capsule Commonly known as: CARDIZEM CD Take 1 capsule (120 mg total) by mouth daily.   fenofibrate 160 MG tablet TAKE 1 TABLET BY MOUTH  DAILY What changed: when to take this   multivitamin with minerals Tabs tablet Take 1 tablet by mouth daily.   oxyCODONE 5 MG immediate release tablet Commonly known as: Oxy IR/ROXICODONE Take 1 tablet (5 mg total) by mouth every 6 (six) hours as needed for moderate pain or severe pain.   Red Yeast Rice 600 MG Tabs Take 600 mg by mouth daily.   rosuvastatin 20 MG tablet Commonly known as: CRESTOR Take 1 tablet (20 mg total) by mouth daily. Start taking on: October 24, 2020   tadalafil 5 MG tablet Commonly known as: CIALIS Take 5 mg by mouth daily as needed for erectile dysfunction.   TAGAMET PO Take 1 tablet by mouth daily as needed (for reflux).   tamsulosin 0.4 MG Caps capsule Commonly known as: FLOMAX Take 0.4 mg by mouth at bedtime as needed (for urinary issues).            Discharge Care Instructions  (From admission, onward)         Start     Ordered   10/23/20 0000  No dressing needed        10/23/20 1538         Allergies  Allergen Reactions  . Shellfish-Derived Products Other (See Comments)    Gout   . Warfarin Sodium Rash    Follow-up Information    [facility] Follow [facility].   Specialty: Rehabilitation Why: The outpatient therapy will contact you for the first appointment Contact information: [address] [id number] mc [place] [address] [phone]       [facility]. Schedule an appointment as soon as possible for a visit in 4 week(s).   Specialty: Neurology Contact information: [address] [place] [address] [phone]       Cardiology,[facility]. Go in 1 month(s).   Why: office will call with FU               The results of significant diagnostics from this hospitalization (including imaging, microbiology, ancillary and laboratory) are listed below for reference.    Significant Diagnostic Studies: CT Code Stroke CTA Head W/WO contrast  Result Date: 10/21/2020 CLINICAL DATA:  Focal neuro deficit, > 6 hrs, stroke suspected EXAM: CT ANGIOGRAPHY HEAD AND NECK TECHNIQUE: Multidetector CT  imaging of the head and neck was performed using the standard protocol during bolus administration of intravenous contrast. Multiplanar CT image reconstructions and MIPs were obtained to evaluate the vascular anatomy. Carotid stenosis measurements (when applicable) are obtained utilizing NASCET criteria, using the distal internal carotid diameter as the denominator. CONTRAST:  OMNIPAQUE IOHEXOL 350 MG/ML SOLN COMPARISON:  Concurrent noncontrast head CT and CT cerebral perfusion. FINDINGS: CTA NECK FINDINGS Aortic arch: Standard branching. Imaged portion shows no evidence of aneurysm or dissection. No significant stenosis of the major arch vessel origins. Minimal atherosclerotic calcifications. Right carotid system: Minimal bifurcation atherosclerotic calcifications. Patent. Left carotid system: Patent. Vertebral arteries: Codominant. Skeleton: Multilevel spondylosis.  No acute finding. Other neck: No acute soft tissue finding. Upper chest: Atelectasis.  No acute finding. Review of the MIP images confirms the above findings CTA HEAD FINDINGS Anterior circulation: Bilateral carotid siphon atherosclerotic calcifications. Patent  ICAs. Patent ACAs. Patent left MCA. Patent right M1 segment. High-grade narrowing of a right M3 branch with non opacification distally. Posterior circulation: Patent V4 segments. Minimal right V4 segment atherosclerotic calcifications. Proximally patent PICA. Basilar and superior cerebellar arteries are patent. Patent left PCA. Patent right PCA. Venous sinuses: As permitted by contrast timing, patent. Anatomic variants: None. Review of the MIP images confirms the above findings IMPRESSION: CTA neck: No acute vascular finding within the neck. CTA head: High-grade narrowing of right M3 branch with non opacification distally. Electronically Signed   By: Stana Bunting M.D.   On: 10/21/2020 13:43   DG Chest 2 View  Result Date: 10/21/2020 CLINICAL DATA:  Left facial droop and left arm weakness, atrial fibrillation EXAM: CHEST - 2 VIEW COMPARISON:  11/10/2012 FINDINGS: The heart size and mediastinal contours are within normal limits. Both lungs are clear. The visualized skeletal structures are unremarkable. IMPRESSION: No active cardiopulmonary disease. Electronically Signed   By: Sharlet Salina M.D.   On: 10/21/2020 15:20   CT Code Stroke CTA Neck W/WO contrast  Result Date: 10/21/2020 CLINICAL DATA:  Focal neuro deficit, > 6 hrs, stroke suspected EXAM: CT ANGIOGRAPHY HEAD AND NECK TECHNIQUE: Multidetector CT imaging of the head and neck was performed using the standard protocol during bolus administration of intravenous contrast. Multiplanar CT image reconstructions and MIPs were obtained to evaluate the vascular anatomy. Carotid stenosis measurements (when applicable) are obtained utilizing NASCET criteria, using the distal internal carotid diameter as the denominator. CONTRAST:  OMNIPAQUE IOHEXOL 350 MG/ML SOLN COMPARISON:  Concurrent noncontrast head CT and CT cerebral perfusion. FINDINGS: CTA NECK FINDINGS Aortic arch: Standard branching. Imaged portion shows no evidence of aneurysm or  dissection. No significant stenosis of the major arch vessel origins. Minimal atherosclerotic calcifications. Right carotid system: Minimal bifurcation atherosclerotic calcifications. Patent. Left carotid system: Patent. Vertebral arteries: Codominant. Skeleton: Multilevel spondylosis.  No acute finding. Other neck: No acute soft tissue finding. Upper chest: Atelectasis.  No acute finding. Review of the MIP images confirms the above findings CTA HEAD FINDINGS Anterior circulation: Bilateral carotid siphon atherosclerotic calcifications. Patent ICAs. Patent ACAs. Patent left MCA. Patent right M1 segment. High-grade narrowing of a right M3 branch with non opacification distally. Posterior circulation: Patent V4 segments. Minimal right V4 segment atherosclerotic calcifications. Proximally patent PICA. Basilar and superior cerebellar arteries are patent. Patent left PCA. Patent right PCA. Venous sinuses: As permitted by contrast timing, patent. Anatomic variants: None. Review of the MIP images confirms the above findings IMPRESSION: CTA neck: No acute vascular finding within the neck. CTA head: High-grade narrowing of right M3  branch with non opacification distally. Electronically Signed   By: Stana Buntinghikanele  Emekauwa M.D.   On: 10/21/2020 13:43   MR BRAIN WO CONTRAST  Result Date: 10/22/2020 CLINICAL DATA:  Stroke follow-up EXAM: MRI HEAD WITHOUT CONTRAST TECHNIQUE: Multiplanar, multiecho pulse sequences of the brain and surrounding structures were obtained without intravenous contrast. COMPARISON:  Head CT and CTA from yesterday FINDINGS: Brain: Posterior right frontal cortically based infarct appears similar in extent to prior CT and unchanged from CT perfusion. No hemorrhage or other acute infarct. No hydrocephalus, mass, or collection. Cerebral volume loss without specific pattern. Chronic small vessel ischemia in the deep white matter is mild. No chronic blood products. Vascular: Normal flow voids. Skull and upper  cervical spine: Normal marrow signal Sinuses/Orbits: Negative IMPRESSION: Moderate acute infarct in the posterior right frontal lobe. No progression since NCCT and CTP yesterday. Electronically Signed   By: Marnee SpringJonathon  Watts M.D.   On: 10/22/2020 07:02   CT Code Stroke Cerebral Perfusion with contrast  Result Date: 10/21/2020 CLINICAL DATA:  Focal neuro deficit, > 6 hrs, stroke suspected EXAM: CT PERFUSION BRAIN TECHNIQUE: Multiphase CT imaging of the brain was performed following IV bolus contrast injection. Subsequent parametric perfusion maps were calculated using RAPID software. CONTRAST:  100mL OMNIPAQUE IOHEXOL 350 MG/ML SOLN COMPARISON:  Concurrent head CT. FINDINGS: CT Brain Perfusion Findings: CBF (<30%) Volume: 6mL Perfusion (Tmax>6.0s) volume: 8mL Mismatch Volume: 2mL ASPECTS on noncontrast CT Head: 9 at 13:05 today. Infarct Core: 6 mL Infarction Location:Right frontal lobe. IMPRESSION: Acute right frontal infarct, core of 6 mL. Electronically Signed   By: Stana Buntinghikanele  Emekauwa M.D.   On: 10/21/2020 13:27   ECHOCARDIOGRAM COMPLETE  Result Date: 10/23/2020    ECHOCARDIOGRAM REPORT   Patient Name:   [name] Short Date of Exam: 10/23/2020 Medical Rec #:  [id number]          Height:       67.0 in Accession #:    [id number]         Weight:       177.9 lb Date of Birth:  [birth year]         BSA:          1.924 m Patient Age:    74 years           BP:           108/79 mmHg Patient Gender: M                  HR:           102 bpm. Exam Location:  Inpatient Procedure: 2D Echo Indications:    stroke 434.91  History:        Patient has prior history of Echocardiogram examinations, most                 recent 05/08/2010.  Sonographer:    Delcie RochLauren Pennington Referring Phys: [id number] MICHAEL E NORINS IMPRESSIONS  1. Pt in atrial fibrillation at time of study.  2. Left ventricular ejection fraction, by estimation, is 60 to 65%. The left ventricle has normal function. The left ventricle has no regional wall motion  abnormalities. There is mild left ventricular hypertrophy. Left ventricular diastolic function could not be evaluated.  3. Right ventricular systolic function is normal. The right ventricular size is normal. Tricuspid regurgitation signal is inadequate for assessing PA pressure.  4. The mitral valve is normal in structure. Trivial mitral valve regurgitation. No evidence of mitral stenosis.  5. The aortic  valve is tricuspid. Aortic valve regurgitation is trivial. No aortic stenosis is present.  6. The inferior vena cava is normal in size with greater than 50% respiratory variability, suggesting right atrial pressure of 3 mmHg. FINDINGS  Left Ventricle: Left ventricular ejection fraction, by estimation, is 60 to 65%. The left ventricle has normal function. The left ventricle has no regional wall motion abnormalities. The left ventricular internal cavity size was normal in size. There is  mild left ventricular hypertrophy. Left ventricular diastolic function could not be evaluated due to atrial fibrillation. Left ventricular diastolic function could not be evaluated. Right Ventricle: The right ventricular size is normal. Right ventricular systolic function is normal. Tricuspid regurgitation signal is inadequate for assessing PA pressure. The tricuspid regurgitant velocity is 2.29 m/s, and with an assumed right atrial  pressure of 3 mmHg, the estimated right ventricular systolic pressure is 24.0 mmHg. Left Atrium: Left atrial size was normal in size. Right Atrium: Right atrial size was normal in size. Pericardium: There is no evidence of pericardial effusion. Mitral Valve: The mitral valve is normal in structure. Trivial mitral valve regurgitation. No evidence of mitral valve stenosis. Tricuspid Valve: The tricuspid valve is normal in structure. Tricuspid valve regurgitation is trivial. No evidence of tricuspid stenosis. Aortic Valve: The aortic valve is tricuspid. Aortic valve regurgitation is trivial. No aortic  stenosis is present. Pulmonic Valve: The pulmonic valve was normal in structure. Pulmonic valve regurgitation is not visualized. No evidence of pulmonic stenosis. Aorta: The aortic root is normal in size and structure. Venous: The inferior vena cava is normal in size with greater than 50% respiratory variability, suggesting right atrial pressure of 3 mmHg.  Additional Comments: Pt in atrial fibrillation at time of study.  LEFT VENTRICLE PLAX 2D LVIDd:         4.20 cm LVIDs:         2.50 cm LV PW:         1.10 cm LV IVS:        1.00 cm LVOT diam:     1.80 cm LVOT Area:     2.54 cm  RIGHT VENTRICLE             IVC RV S prime:     14.40 cm/s  IVC diam: 1.90 cm LEFT ATRIUM             Index       RIGHT ATRIUM           Index LA diam:        3.70 cm 1.92 cm/m  RA Area:     12.60 cm LA Vol (A2C):   43.3 ml 22.50 ml/m RA Volume:   29.40 ml  15.28 ml/m LA Vol (A4C):   35.4 ml 18.40 ml/m LA Biplane Vol: 39.4 ml 20.48 ml/m   AORTA Ao Root diam: 3.00 cm Ao Asc diam:  3.30 cm TRICUSPID VALVE TR Peak grad:   21.0 mmHg TR Vmax:        229.00 cm/s  SHUNTS Systemic Diam: 1.80 cm Olga MillersBrian Crenshaw MD Electronically signed by Olga MillersBrian Crenshaw MD Signature Date/Time: 10/23/2020/2:18:05 PM    Final    CT HEAD CODE STROKE WO CONTRAST  Result Date: 10/21/2020 CLINICAL DATA:  Code stroke.  Neuro deficit, acute, stroke suspected EXAM: CT HEAD WITHOUT CONTRAST TECHNIQUE: Contiguous axial images were obtained from the base of the skull through the vertex without intravenous contrast. COMPARISON:  None. FINDINGS: Brain: No intracranial hemorrhage. Ill-defined hypodense region involving the  right frontal lobe (2:22). No mass lesion. No midline shift, ventriculomegaly or extra-axial fluid collection. Mild chronic microvascular ischemic changes. Vascular: No hyperdense vessel or unexpected calcification. Bilateral skull base atherosclerotic calcifications. Skull: No acute finding. Sinuses/Orbits: No acute orbital finding. Pneumatized  paranasal sinuses and mastoid air cells. Other: None. ASPECTS [facility] Stroke Program Early CT Score) - Ganglionic level infarction (caudate, lentiform nuclei, internal capsule, insula, M1-M3 cortex): 7 - Supraganglionic infarction (M4-M6 cortex): 2 Total score (0-10 with 10 being normal): 9 IMPRESSION: 1. Ill-defined right frontal hypodensity concerning for acute infarct. No intracranial hemorrhage. 2. ASPECTS is 9 Code stroke imaging results were communicated on 10/21/2020 at 1:04 pm to provider Dr. Amada Jupiter via secure text paging. Electronically Signed   By: Stana Bunting M.D.   On: 10/21/2020 13:05    Microbiology: Recent Results (from the past 240 hour(s))  SARS Coronavirus 2 by RT PCR (hospital order, performed in [facility] hospital lab) Nasopharyngeal Nasopharyngeal Swab     Status: None   Collection Time: 10/21/20  6:47 PM   Specimen: Nasopharyngeal Swab  Result Value Ref Range Status   SARS Coronavirus 2 NEGATIVE NEGATIVE Final    Comment: (NOTE) SARS-CoV-2 target nucleic acids are NOT DETECTED.  The SARS-CoV-2 RNA is generally detectable in upper and lower respiratory specimens during the acute phase of infection. The lowest concentration of SARS-CoV-2 viral copies this assay can detect is 250 copies / mL. A negative result does not preclude SARS-CoV-2 infection and should not be used as the sole basis for treatment or other patient management decisions.  A negative result may occur with improper specimen collection / handling, submission of specimen other than nasopharyngeal swab, presence of viral mutation(s) within the areas targeted by this assay, and inadequate number of viral copies (<250 copies / mL). A negative result must be combined with clinical observations, patient history, and epidemiological information.  Fact Sheet for Patients:   BoilerBrush.com.cy  Fact Sheet for Healthcare  Providers: https://pope.com/  This test is not yet approved or  cleared by the [place] FDA and has been authorized for detection and/or diagnosis of SARS-CoV-2 by FDA under an Emergency Use Authorization (EUA).  This EUA will remain in effect (meaning this test can be used) for the duration of the COVID-19 declaration under Section 564(b)(1) of the Act, 21 U.S.C. section 360bbb-3(b)(1), unless the authorization is terminated or revoked sooner.  Performed at [facility], [address] [place], [place] [address]      Labs: Basic Metabolic Panel: Recent Labs  Lab 10/21/20 1245 10/21/20 1251 10/23/20 0550  NA 142 142 140  K 4.1 3.9 3.7  CL 106 106 104  CO2 25  --  23  GLUCOSE 112* 103* 107*  BUN 22 25* 15  CREATININE 1.37* 1.30* 1.21  CALCIUM 9.4  --  9.2   Liver Function Tests: Recent Labs  Lab 10/21/20 1245  AST 24  ALT 18  ALKPHOS 45  BILITOT 1.0  PROT 7.0  ALBUMIN 3.5   No results for input(s): LIPASE, AMYLASE in the last 168 hours. No results for input(s): AMMONIA in the last 168 hours. CBC: Recent Labs  Lab 10/21/20 1245 10/21/20 1251 10/23/20 0550  WBC 10.8*  --  12.1*  NEUTROABS 7.1  --   --   HGB 12.8* 13.3 13.5  HCT 39.8 39.0 40.0  MCV 93.0  --  88.9  PLT 246  --  288   Cardiac Enzymes: No results for input(s): CKTOTAL, CKMB, CKMBINDEX,  TROPONINI in the last 168 hours. BNP: BNP (last 3 results) No results for input(s): BNP in the last 8760 hours.  ProBNP (last 3 results) No results for input(s): PROBNP in the last 8760 hours.  CBG: No results for input(s): GLUCAP in the last 168 hours.     Signed:  Zannie Cove MD.  [facility] 10/23/2020, 3:41 PM

## 2020-10-23 NOTE — Progress Notes (Signed)
   10/23/20 0950  Clinical Encounter Type  Visited With Patient not available  Visit Type Initial  Referral From Nurse  Consult/Referral To Chaplain   Chaplain responded to AD consult request. Pt was occupied with medial staff. Chaplain will follow-up at a later time.  This note was prepared by Chaplain Resident, Tacy Learn, MDiv. For questions, please contact by phone at [phone].

## 2020-10-25 ENCOUNTER — Telehealth: Payer: Self-pay | Admitting: Cardiology

## 2020-10-25 NOTE — Telephone Encounter (Signed)
Add on for tomorrow at 1:40pm with Dr. Bjorn Pippin.

## 2020-10-26 ENCOUNTER — Ambulatory Visit: Payer: Medicare Other | Admitting: Cardiology

## 2020-10-29 ENCOUNTER — Ambulatory Visit: Payer: Medicare Other

## 2020-10-29 ENCOUNTER — Ambulatory Visit: Payer: Medicare Other | Admitting: Occupational Therapy

## 2020-10-29 ENCOUNTER — Other Ambulatory Visit: Payer: Self-pay

## 2020-10-29 ENCOUNTER — Ambulatory Visit: Payer: Medicare Other | Attending: Internal Medicine | Admitting: Speech Pathology

## 2020-10-29 ENCOUNTER — Encounter: Payer: Self-pay | Admitting: Occupational Therapy

## 2020-10-29 ENCOUNTER — Encounter: Payer: Self-pay | Admitting: Speech Pathology

## 2020-10-29 DIAGNOSIS — R202 Paresthesia of skin: Secondary | ICD-10-CM

## 2020-10-29 DIAGNOSIS — R1312 Dysphagia, oropharyngeal phase: Secondary | ICD-10-CM | POA: Insufficient documentation

## 2020-10-29 DIAGNOSIS — M6281 Muscle weakness (generalized): Secondary | ICD-10-CM

## 2020-10-29 DIAGNOSIS — R293 Abnormal posture: Secondary | ICD-10-CM | POA: Insufficient documentation

## 2020-10-29 DIAGNOSIS — R29818 Other symptoms and signs involving the nervous system: Secondary | ICD-10-CM

## 2020-10-29 DIAGNOSIS — I69354 Hemiplegia and hemiparesis following cerebral infarction affecting left non-dominant side: Secondary | ICD-10-CM | POA: Insufficient documentation

## 2020-10-29 DIAGNOSIS — G8194 Hemiplegia, unspecified affecting left nondominant side: Secondary | ICD-10-CM | POA: Diagnosis not present

## 2020-10-29 DIAGNOSIS — R278 Other lack of coordination: Secondary | ICD-10-CM | POA: Diagnosis not present

## 2020-10-29 DIAGNOSIS — R471 Dysarthria and anarthria: Secondary | ICD-10-CM | POA: Diagnosis not present

## 2020-10-29 DIAGNOSIS — M25571 Pain in right ankle and joints of right foot: Secondary | ICD-10-CM | POA: Diagnosis not present

## 2020-10-29 DIAGNOSIS — R2681 Unsteadiness on feet: Secondary | ICD-10-CM | POA: Diagnosis not present

## 2020-10-29 NOTE — Patient Instructions (Signed)
  Overarticulate (exagerrate) mouth movements of the the lip and tongue to increase intelligibility.   Keep a list of items you feel like you get strangled on and bring to therapy.

## 2020-10-29 NOTE — Therapy (Signed)
[facility] [address] [place], [place], [address] Phone: [phone]   Fax:  [phone]  Speech Language Pathology Evaluation  Patient Details  Name: [name] Short MRN: [id number] Date of Birth: 1945-12-27 Referring Provider (SLP): Zannie Cove, MD   Encounter Date: 10/29/2020   End of Session - 10/29/20 1504    Visit Number 1    Number of Visits 17    Date for SLP Re-Evaluation 11/26/20    SLP Start Time 1404    SLP Stop Time  1445    SLP Time Calculation (min) 41 min    Activity Tolerance Patient tolerated treatment well           Past Medical History:  Diagnosis Date  . Arthritis    WRIST  . Benign localized prostatic hyperplasia with lower urinary tract symptoms (LUTS)   . Complication of anesthesia    slow to wake  . ED (erectile dysfunction)   . GERD (gastroesophageal reflux disease)   . History of concussion    AS CHILD--  NO RESIDUAL  . History of gout   . History of kidney stones   . Hyperlipidemia   . Inguinal hernia, bilateral   . Nephrolithiasis    BILATERAL  . PAF (paroxysmal atrial fibrillation) (HCC) currently being followed by pcp   EPISODE --  2011  FOLLOW-UP W/ DR WALL  /  HAS NOT SEEN ANY CARDIOLOGIST SINCE    Past Surgical History:  Procedure Laterality Date  . APPENDECTOMY  1983  . CARDIOVASCULAR STRESS TEST  05-08-2010  DR WALL   NO EVIDENCE OF SCAR OR ISCHEMIA/ EF 54%/  PT HAD BOTH AFIB/ AFLUTTER DURING STUDY  . CARPAL TUNNEL RELEASE Right 01/ 14/ 2021  . CYSTOSCOPY W/ URETERAL STENT PLACEMENT Left 09/20/2013   Procedure: CYSTOSCOPY WITH STENT REPLACEMENT;  Surgeon: Antony Haste, MD;  Location: [facility];  Service: Urology;  Laterality: Left;  . CYSTOSCOPY WITH URETEROSCOPY Left 09/20/2013   Procedure: CYSTOSCOPY WITH URETEROSCOPY;  Surgeon: Antony Haste, MD;  Location: [facility];  Service: Urology;  Laterality: Left;   . CYSTOSCOPY WITH URETEROSCOPY AND STENT PLACEMENT Left 07/12/2013   Procedure: CYSTOSCOPY WITH LITHOLAPEXY, LEFT URETERAL STENT PLACEMENT;  Surgeon: Antony Haste, MD;  Location: [facility];  Service: Urology;  Laterality: Left;  . CYSTOSCOPY WITH URETEROSCOPY AND STENT PLACEMENT Bilateral 06/23/2014   Procedure: BILATERAL URETEROSCOPY, HOLMIUM LASER LITHOTRIPSY AND STENT PLACEMENT, RIGHT ;  Surgeon: Jerilee Field, MD;  Location: [facility];  Service: Urology;  Laterality: Bilateral;  . EXTRACORPOREAL SHOCK WAVE LITHOTRIPSY  X2  . EXTRACORPOREAL SHOCK WAVE LITHOTRIPSY Left 08-29-2013;   07-28-2013  . EXTRACORPOREAL SHOCK WAVE LITHOTRIPSY Right 12/09/2018   Procedure: EXTRACORPOREAL SHOCK WAVE LITHOTRIPSY (ESWL);  Surgeon: Jerilee Field, MD;  Location: [facility];  Service: Urology;  Laterality: Right;  . HOLMIUM LASER APPLICATION Left 09/20/2013   Procedure: HOLMIUM LASER APPLICATION;  Surgeon: Antony Haste, MD;  Location: [facility];  Service: Urology;  Laterality: Left;  . INGUINAL HERNIA REPAIR Bilateral 10/16/2020   Procedure: BILATERAL OPEN INGUINAL HERNIA REPAIR WITH MESH;  Surgeon: Abigail Miyamoto, MD;  Location: [facility];  Service: General;  Laterality: Bilateral;  LMA/TAP BLOCK  . LAPAROSCOPIC INGUINAL HERNIA REPAIR Bilateral 09-04-2010   w/ mesh  . TONSILLECTOMY  AS CHILD  . 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 TOTAL HIP ARTHROPLASTY Right 11/15/2012   Procedure: RIGHT TOTAL  HIP ARTHROPLASTY ANTERIOR APPROACH;  Surgeon: Eldred Manges, MD;  Location: [facility];  Service: Orthopedics;  Laterality: Right;  Right total hip arthroplasty  . TRANSTHORACIC ECHOCARDIOGRAM  05-08-2010   NORMAL LVSF/  EF 55%/  GRADE I DIASTOLIC DYSFUNCTION/  MILD BILATERAL ATRIUM ENLARGEMENT    There were no vitals filed for this visit.   Subjective Assessment - 10/29/20 1453    Subjective Pt reports feeling embarrassed  by his speech.    Patient is accompained by: Family member   Wife, Dawn   Currently in Pain? No/denies              SLP Evaluation [facility] - 10/29/20 1454      SLP Visit Information   SLP Received On 10/29/20    Referring Provider (SLP) Zannie Cove, MD    Onset Date 10/21/20    Medical Diagnosis CVA      General Information   HPI 75 yo male presenting with left facial droop and left arm weakness. MRI showed moderate acute infarct in the posterior right frontal lobe. PMH including HLD, gout, OA, BPH, and recent bil inguinal hernia repair 10/17/19.    Mobility Status Ambulatory      Balance Screen   Has the patient fallen in the past 6 months No    Has the patient had a decrease in activity level because of a fear of falling?  No    Is the patient reluctant to leave their home because of a fear of falling?  No      Prior Functional Status   Cognitive/Linguistic Baseline Within functional limits    Type of Home House     Lives With Spouse    Available Support Family    Education college    Vocation Part time employment      Cognition   Overall Cognitive Status Within Functional Limits for tasks assessed      Oral Motor/Sensory Function   Labial Strength Reduced    Labial Sensation Reduced Left    Labial Coordination WFL    Lingual ROM Within Functional Limits    Lingual Strength Reduced Left    Lingual Sensation Reduced Left    Facial Symmetry Left droop    Facial Sensation Reduced      Motor Speech   Overall Motor Speech Impaired    Articulation Impaired    Level of Impairment Phrase    Intelligibility Intelligibility reduced    Word 75-100% accurate    Phrase 75-100% accurate    Sentence 75-100% accurate    Conversation 75-100% accurate    Effective Techniques Increased vocal intensity;Over-articulate      Standardized Assessments   Standardized Assessments  Other Assessment    Other Assessment SLUMS                SLU Mental Status (SLUMS  Examination)  Orientation: 3/3  Delayed Recall w/ Interference: 5/5  Numeric Calculation and Registration: 3/3  Immediate Recall w/ Interference (Generative naming): 3/3  Registration and Digit Span: 1/2  Visual Spatial/Exec Functioning: 6/6  Executive Functioning/Extrapolation: 8 /8   29/30; [facility]           SLP Education - 10/29/20 1503    Education Details SLP provided edu on dysarthria and provided single strategy to attempt at home.    Person(s) Educated Patient;Spouse    Methods Explanation    Comprehension Verbalized understanding            SLP Short Term Goals - 10/29/20  1515      SLP SHORT TERM GOAL #1   Title The patient will use 2 of the following (over articulation/slow rate/writing key word/elongation of the vowel/increased loudness/phrasing) strategies to improve speech intelligibility in sentences given a single verbal cue.    Baseline Pt does not use strategies.    Time 4    Period Weeks    Status New      SLP SHORT TERM GOAL #2   Title The patient will improve respiratory support for the production of sentences given a single verbal or visual cue.    Baseline Poor respiratory support; quietness of speech    Time 4    Period Weeks    Status New      SLP SHORT TERM GOAL #3   Title Further assessment of swallowing warranted    Baseline Unable to assess this session.    Time 1    Period Weeks    Status New            SLP Long Term Goals - 10/29/20 1512      SLP LONG TERM GOAL #1   Title Patient will develop functional and intelligible speech by utilizing compensatory strategies to address the use of adequate labial and lingual function, respiratory support, and increased articulatory precision for speech.    Baseline Does not use strategies.    Time 8    Period Weeks    Status New            Plan - 10/29/20 1505    Clinical Impression Statement Pt is a 75 yo male seen following CVA. Pt w/ complaints of speech impairment. L facial  drooping and decreased sensitivity noted this assessment. SLP assessed cognitive-communication via SLUMS and found pt to be [facility]. Speech was assessed and found to be impaired. Pt currently presents with mild dysarthria characterized by imprecise consonant production, slow speech rate, and reduced loudness. Speech intelligibility noted to be 75% in a known context. Unable to assess swallowing this date. Pt reports rare strangulation with thin liquids and soft foods. Reports fatigue with chewing some meats and anterior labial spillage with thin liquids. SLP to further assess next session. Pt to use straw w/ thin liquids and mince foods. Pt and wife in agreement.    Speech Therapy Frequency 1x /week    Duration 8 weeks    Treatment/Interventions Compensatory strategies;Functional tasks;Patient/family education;Environmental controls;Multimodal communcation approach;Internal/external aids;SLP instruction and feedback;Compensatory techniques    SLP Home Exercise Plan Will provide next session    Consulted and Agree with Plan of Care Family member/caregiver;Patient    Family Member Consulted Wife - Dawn           Patient will benefit from skilled therapeutic intervention in order to improve the following deficits and impairments:   Dysarthria  Dysphagia, oropharyngeal phase    Problem List Patient Active Problem List   Diagnosis Date Noted  . CVA (cerebral vascular accident) (HCC) 10/21/2020  . Bilateral primary osteoarthritis of knee 02/15/2020  . Acquired trigger finger of right little finger 11/03/2019  . Encounter for orthopedic follow-up care 10/13/2019  . H/O total hip arthroplasty, bilateral 10/24/2018  . Gout 08/18/2017  . Overweight (BMI 25.0-29.9) 01/23/2017  . Foot pain, left 02/11/2016  . Sebaceous cyst 07/14/2014  . Sun-damaged skin 07/14/2014  . 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/14/2014  . Physical exam, annual 05/17/2012  . Inguinal hernia 08/12/2010  . HYPERLIPIDEMIA TYPE I / IV  05/01/2010  . TOBACCO USER 05/01/2010  .  Paroxysmal a-fib (HCC) 05/01/2010  . ABNORMAL ELECTROCARDIOGRAM 03/26/2010    Dorena Bodo, MS [place], CBIS 10/29/2020, 3:27 PM  [facility] [address] [place], [place], [address] Phone: [phone]   Fax:  [phone]  Name: [name] Short MRN: [id number] Date of Birth: Jun 27, 1946

## 2020-10-29 NOTE — Therapy (Signed)
[facility] [address] [place], [place], [address] Phone: [phone]   Fax:  [phone]  Physical Therapy Evaluation  Patient Details  Name: [name] Short MRN: [id number] Date of Birth: Jul 15, 1946 Referring Provider (PT): Ernie Avena Date: 10/29/2020   PT End of Session - 10/29/20 1752    Visit Number 1    Number of Visits 9    Date for PT Re-Evaluation 12/24/20    PT Start Time 1448    PT Stop Time 1533    PT Time Calculation (min) 45 min    Equipment Utilized During Treatment Gait belt    Activity Tolerance Patient tolerated treatment well    Behavior During Therapy [facility] for tasks assessed/performed           Past Medical History:  Diagnosis Date  . Arthritis    WRIST  . Benign localized prostatic hyperplasia with lower urinary tract symptoms (LUTS)   . Complication of anesthesia    slow to wake  . ED (erectile dysfunction)   . GERD (gastroesophageal reflux disease)   . History of concussion    AS CHILD--  NO RESIDUAL  . History of gout   . History of kidney stones   . Hyperlipidemia   . Inguinal hernia, bilateral   . Nephrolithiasis    BILATERAL  . PAF (paroxysmal atrial fibrillation) (HCC) currently being followed by pcp   EPISODE --  2011  FOLLOW-UP W/ DR WALL  /  HAS NOT SEEN ANY CARDIOLOGIST SINCE    Past Surgical History:  Procedure Laterality Date  . APPENDECTOMY  1983  . CARDIOVASCULAR STRESS TEST  05-08-2010  DR WALL   NO EVIDENCE OF SCAR OR ISCHEMIA/ EF 54%/  PT HAD BOTH AFIB/ AFLUTTER DURING STUDY  . CARPAL TUNNEL RELEASE Right 01/ 14/ 2021  . CYSTOSCOPY W/ URETERAL STENT PLACEMENT Left 09/20/2013   Procedure: CYSTOSCOPY WITH STENT REPLACEMENT;  Surgeon: Antony Haste, MD;  Location: [facility];  Service: Urology;  Laterality: Left;  . CYSTOSCOPY WITH URETEROSCOPY Left 09/20/2013   Procedure: CYSTOSCOPY WITH URETEROSCOPY;  Surgeon: Antony Haste, MD;  Location: [facility];  Service: Urology;  Laterality: Left;  . CYSTOSCOPY WITH URETEROSCOPY AND STENT PLACEMENT Left 07/12/2013   Procedure: CYSTOSCOPY WITH LITHOLAPEXY, LEFT URETERAL STENT PLACEMENT;  Surgeon: Antony Haste, MD;  Location: [facility];  Service: Urology;  Laterality: Left;  . CYSTOSCOPY WITH URETEROSCOPY AND STENT PLACEMENT Bilateral 06/23/2014   Procedure: BILATERAL URETEROSCOPY, HOLMIUM LASER LITHOTRIPSY AND STENT PLACEMENT, RIGHT ;  Surgeon: Jerilee Field, MD;  Location: [facility];  Service: Urology;  Laterality: Bilateral;  . EXTRACORPOREAL SHOCK WAVE LITHOTRIPSY  X2  . EXTRACORPOREAL SHOCK WAVE LITHOTRIPSY Left 08-29-2013;   07-28-2013  . EXTRACORPOREAL SHOCK WAVE LITHOTRIPSY Right 12/09/2018   Procedure: EXTRACORPOREAL SHOCK WAVE LITHOTRIPSY (ESWL);  Surgeon: Jerilee Field, MD;  Location: [facility];  Service: Urology;  Laterality: Right;  . HOLMIUM LASER APPLICATION Left 09/20/2013   Procedure: HOLMIUM LASER APPLICATION;  Surgeon: Antony Haste, MD;  Location: [facility];  Service: Urology;  Laterality: Left;  . INGUINAL HERNIA REPAIR Bilateral 10/16/2020   Procedure: BILATERAL OPEN INGUINAL HERNIA REPAIR WITH MESH;  Surgeon: Abigail Miyamoto, MD;  Location: [facility];  Service: General;  Laterality: Bilateral;  LMA/TAP BLOCK  . LAPAROSCOPIC INGUINAL HERNIA REPAIR Bilateral 09-04-2010   w/ mesh  . TONSILLECTOMY  AS CHILD  . TOTAL  HIP ARTHROPLASTY Left 05-11-2002  . 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 TRANSTHORACIC ECHOCARDIOGRAM  05-08-2010   NORMAL LVSF/  EF 55%/  GRADE I DIASTOLIC DYSFUNCTION/  MILD BILATERAL ATRIUM ENLARGEMENT    There were no vitals filed for this  visit.    Subjective Assessment - 10/29/20 1454    Subjective 75 yo male post stroke 10/21/20. MRI showing moderate acute infarct in the posterior right frontal lobe. Discharged from hospital on 10/23/2020. Pt reports most difficulty using his hand but otherwise feels like he is getting around well at home. He was discharged to home and has not returned to [facility] - grocery shopping, recreational tennis.    Patient is accompained by: Family member   Wife, Dawn   Pertinent History Hx of gout with recent flareups to the right foot, OA, A Fib, GERD, B hip replacements, carpal tunnel last january.    How long can you sit comfortably? Gets restless when sitting for long time.    Patient Stated Goals to get back to PLOF, get stronger    Currently in Pain? No/denies              [facility] PT Assessment - 10/29/20 0001      Assessment   Medical Diagnosis s/p CVA    Referring Provider (PT) Jomarie Longs    Onset Date/Surgical Date 10/21/20    Hand Dominance Right    Next MD Visit february 28th   Neuro     Balance Screen   Has the patient fallen in the past 6 months No    Has the patient had a decrease in activity level because of a fear of falling?  No    Is the patient reluctant to leave their home because of a fear of falling?  No      Home Environment   Living Environment Private residence    Living Arrangements Spouse/significant other    Type of Home House    Home Access --   multilevel. front: 4 + 3 no HR.  currently comine in through basement 13 steps + 1 HR.     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      Cognition   Overall Cognitive Status Within Functional Limits for tasks assessed      Posture/Postural Control   Posture/Postural Control Postural limitations    Postural Limitations Rounded Shoulders;Forward head;Increased thoracic kyphosis;Posterior pelvic tilt;Weight shift right      Transfers   Five time sit  to stand comments  17 seconds   >13 seconds = balance dysfunction     Ambulation/Gait   Gait Pattern Decreased arm swing - left;Decreased step length - left;Decreased stance time - left;Decreased stride length;Decreased hip/knee flexion - left;Decreased weight shift to left    Stairs Yes    Stairs Assistance 6: Modified independent (Device/Increase time)    Stair Management Technique One rail Right;Alternating pattern    Height of Stairs --   4 & 6 inch     Balance   Balance Assessed Yes      Standardized Balance Assessment   Standardized Balance Assessment Modified CTSIB EO/Floor: 1 minute EC/Floor: 24 seconds EO/Foam: 35 seconds EC/Foam: 12 seconds     Berg Balance Test   Sit to  Stand Able to stand without using hands and stabilize independently    Standing Unsupported Able to stand safely 2 minutes    Sitting with Back Unsupported but Feet Supported on Floor or Stool Able to sit safely and securely 2 minutes    Stand to Sit Sits safely with minimal use of hands    Transfers Able to transfer safely, minor use of hands    Standing Unsupported with Eyes Closed Able to stand 10 seconds with supervision    Standing Unsupported with Feet Together Able to place feet together independently and stand 1 minute safely    From Standing, Reach Forward with Outstretched Arm Can reach forward >12 cm safely (5")    From Standing Position, Pick up Object from Floor Able to pick up shoe safely and easily    From Standing Position, Turn to Look Behind Over each Shoulder Looks behind one side only/other side shows less weight shift    Turn 360 Degrees Able to turn 360 degrees safely in 4 seconds or less    Standing Unsupported, Alternately Place Feet on Step/Stool Able to stand independently and safely and complete 8 steps in 20 seconds    Standing Unsupported, One Foot in Front Able to plae foot ahead of the other independently and hold 30 seconds    Standing on One Leg Able to lift leg  independently and hold 5-10 seconds   10" each   Total Score 51    Berg comment: Moderate falls risk      Dynamic Gait Index   Level Surface Normal    Change in Gait Speed Normal    Gait with Horizontal Head Turns Moderate Impairment    Gait with Vertical Head Turns Mild Impairment    Gait and Pivot Turn Normal    Step Over Obstacle Mild Impairment    Step Around Obstacles Normal    Steps Mild Impairment    Total Score 19    DGI comment: Scores of 19 or less are predictive of falls in older community living adults.          Standing Balance SLS: 10" B Tandem: Right leading= 20" mod sway, Left leading= 30" min sway  Strength: B Hip Ext and Abd 3+/5. L hip Flexion 4-/5. R hip flexion 4+/5.   Tone: Grossly WFL left v s right.              Objective measurements completed on examination: See above findings.               PT Education - 10/29/20 1752    Education Details PT POC and HEP    Person(s) Educated Patient;Spouse    Methods Explanation;Demonstration;Handout    Comprehension Verbalized understanding;Returned demonstration            PT Short Term Goals - 10/29/20 1758      PT SHORT TERM GOAL #1   Title Pt will be independent with initial HEP    Time 2    Period Weeks    Status New    Target Date 11/12/20             PT Long Term Goals - 10/29/20 1758      PT LONG TERM GOAL #1   Title Pt will be able to complete gait with vertical and horizontal head turns without LOB and with minimal to no gait deviations to facilitate safe negotiation of busy community environments.    Time 8    Period Weeks  Status New    Target Date 12/24/20      PT LONG TERM GOAL #2   Title Pt will complete 5TSTS in no greater than 12 seconds to demo improved BLE strength and funcitonal balance.    Time 8    Period Weeks    Status New    Target Date 12/24/20      PT LONG TERM GOAL #3   Title Pt will be able to negotiate standard height steps safely  with alternating pattern and no HR without LOB.    Time 8    Period Weeks    Status New    Target Date 12/24/20      PT LONG TERM GOAL #4   Title Pt will be able to hold MCTSIB condition #4 balance with feet together and EC on foam for at least 20 seconds with minimal sway and no LOB    Time 8    Period Weeks    Status New    Target Date 12/24/20                  Plan - 10/29/20 1545    Clinical Impression Statement Pt is a 75 yo male post stroke 10/21/20 affecting left side primarily. Prior to this on 10/16/2020 he had B inguinal hernia repair surgery, pt reports it is thought he had his stroke a few days after due to his A Fib. He was Discharged from hospital on 10/23/2020. Pt reports most difficulty using his left hand but otherwise feels like he is getting around well at home. He was discharged to home and [facility], recreational tennis. Pt presents with decreased LE strength, decreased endurance and gait speed, and decreased postural stability/balance (difficutly with NBOS/SLS, compliant surfaces, and without visual input/EC).  Pt scored 17 seconds on 5TSTS indicative of balance dysfunction, scoreed 51/56 on the berg, and scored 19/24 on the DGI with most difficulty noted with gait with head turns indicating moderate fall risk. Pt will benefit from skilled physical therapy to improve strength, functional mobility and dynamic balance to work towards PLOF with safe independent mobility and to decrease falls risk.    Personal Factors and Comorbidities Age;Fitness;Time since onset of injury/illness/exacerbation;Comorbidity 3+    Comorbidities Hx of gout with recent flareups to the right foot, OA, A Fib, GERD, B hip replacements, carpal tunnel last january.    Examination-Activity Limitations Locomotion Level;Stairs;Lift    Examination-Participation Restrictions Community Activity;Interpersonal Relationship    Stability/Clinical Decision Making  Evolving/Moderate complexity    Clinical Decision Making Moderate    Rehab Potential Good    PT Frequency 1x / week    PT Duration 8 weeks    PT Treatment/Interventions ADLs/Self Care Home Management;Biofeedback;Electrical Stimulation;Cryotherapy;Moist Heat;Neuromuscular re-education;Balance training;Therapeutic exercise;Therapeutic activities;Functional mobility training;Stair training;Gait training;Patient/family education;Manual techniques;Energy conservation;Passive range of motion;Taping;Vasopneumatic Device    PT Next Visit Plan Follow up regarding carryover and tolerance to HEP.Take baseline VS pre activity and as needed. Focus of sessions on endurance, higher level balance training, and general strengthening.    PT Home Exercise Plan Standing hip ABD and SLS balance with counter support, Standing balance with head turns (advised to complete this near support surface, in a corner as needed for safety).    Consulted and Agree with Plan of Care Patient;Family member/caregiver    Family Member Consulted Wife, Dawn           Patient will benefit from skilled therapeutic intervention in order to  improve the following deficits and impairments:  Abnormal gait,Decreased coordination,Decreased endurance,Decreased activity tolerance,Pain,Improper body mechanics,Impaired flexibility,Decreased balance,Decreased mobility,Decreased strength,Postural dysfunction  Visit Diagnosis: Unsteadiness on feet - Plan: PT plan of care cert/re-cert  Muscle weakness (generalized) - Plan: PT plan of care cert/re-cert  Abnormal posture - Plan: PT plan of care cert/re-cert  Hemiplegia affecting left nondominant side, unspecified etiology, unspecified hemiplegia type (HCC) - Plan: PT plan of care cert/re-cert  Pain in right ankle and joints of right foot - Plan: PT plan of care cert/re-cert     Problem List Patient Active Problem List   Diagnosis Date Noted  . CVA (cerebral vascular accident) (HCC)  10/21/2020  . Bilateral primary osteoarthritis of knee 02/15/2020  . Acquired trigger finger of right little finger 11/03/2019  . Encounter for orthopedic follow-up care 10/13/2019  . H/O total hip arthroplasty, bilateral 10/24/2018  . Gout 08/18/2017  . Overweight (BMI 25.0-29.9) 01/23/2017  . Foot pain, left 02/11/2016  . Sebaceous cyst 07/14/2014  . Sun-damaged skin 07/14/2014  . 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/14/2014  . Physical exam, annual 05/17/2012  . Inguinal hernia 08/12/2010  . 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 TOBACCO USER 05/01/2010  . Paroxysmal a-fib (HCC) 05/01/2010  . ABNORMAL ELECTROCARDIOGRAM 03/26/2010    Anson Crofts, PT, DPT 10/29/2020, 6:09 PM  [facility] [address] [place], [place], [address] Phone: [phone]   Fax:  [phone]  Name: [name] Short MRN: [id number] Date of Birth: [birth year]

## 2020-10-29 NOTE — Patient Instructions (Signed)
Access Code: [email or web address] URL: [email or web address] Date: 10/29/2020 Prepared by: Claude Manges  Exercises Standing Hip Abduction with Counter Support - 1 x daily - 7 x weekly - 3 sets - 10 reps Single Leg Stance with Support - 1 x daily - 7 x weekly - 3 sets - 10 reps Sit to Stand with Arms Crossed - 1 x daily - 7 x weekly - 3 sets - 10 reps Standing with Head Rotation - 1 x daily - 7 x weekly - 3 sets - 10 reps

## 2020-10-30 NOTE — Therapy (Signed)
[facility] [address] [place], [place], [address] Phone: [phone]   Fax:  [phone]  Occupational Therapy Evaluation  Patient Details  Name: [name] Short MRN: [id number] Date of Birth: 1946-07-22 Referring Provider (OT): Zannie Cove, MD   Encounter Date: 10/29/2020   OT End of Session - 10/29/20 2251    Visit Number 1    Number of Visits 17    Authorization Type Medicare Part A and B    Progress Note Due on Visit 10    OT Start Time 1536   Previous sessions ran long   OT Stop Time 1620    OT Time Calculation (min) 44 min    Equipment Utilized During Treatment 9-Hole Peg Test, Box and Blocks Test, goniometer, grip and pinch dynamometer    Activity Tolerance Patient tolerated treatment well    Behavior During Therapy WFL for tasks assessed/performed           Past Medical History:  Diagnosis Date  . Arthritis    WRIST  . Benign localized prostatic hyperplasia with lower urinary tract symptoms (LUTS)   . Complication of anesthesia    slow to wake  . ED (erectile dysfunction)   . GERD (gastroesophageal reflux disease)   . History of concussion    AS CHILD--  NO RESIDUAL  . History of gout   . History of kidney stones   . Hyperlipidemia   . Inguinal hernia, bilateral   . Nephrolithiasis    BILATERAL  . PAF (paroxysmal atrial fibrillation) (HCC) currently being followed by pcp   EPISODE --  2011  FOLLOW-UP W/ DR WALL  /  HAS NOT SEEN ANY CARDIOLOGIST SINCE    Past Surgical History:  Procedure Laterality Date  . APPENDECTOMY  1983  . CARDIOVASCULAR STRESS TEST  05-08-2010  DR WALL   NO EVIDENCE OF SCAR OR ISCHEMIA/ EF 54%/  PT HAD BOTH AFIB/ AFLUTTER DURING STUDY  . CARPAL TUNNEL RELEASE Right 01/ 14/ 2021  . CYSTOSCOPY W/ URETERAL STENT PLACEMENT Left 09/20/2013   Procedure: CYSTOSCOPY WITH STENT REPLACEMENT;  Surgeon: Antony Haste, MD;  Location: [facility];  Service:  Urology;  Laterality: Left;  . CYSTOSCOPY WITH URETEROSCOPY Left 09/20/2013   Procedure: CYSTOSCOPY WITH URETEROSCOPY;  Surgeon: Antony Haste, MD;  Location: [facility];  Service: Urology;  Laterality: Left;  . CYSTOSCOPY WITH URETEROSCOPY AND STENT PLACEMENT Left 07/12/2013   Procedure: CYSTOSCOPY WITH LITHOLAPEXY, LEFT URETERAL STENT PLACEMENT;  Surgeon: Antony Haste, MD;  Location: [facility];  Service: Urology;  Laterality: Left;  . CYSTOSCOPY WITH URETEROSCOPY AND STENT PLACEMENT Bilateral 06/23/2014   Procedure: BILATERAL URETEROSCOPY, HOLMIUM LASER LITHOTRIPSY AND STENT PLACEMENT, RIGHT ;  Surgeon: Jerilee Field, MD;  Location: [facility];  Service: Urology;  Laterality: Bilateral;  . EXTRACORPOREAL SHOCK WAVE LITHOTRIPSY  X2  . EXTRACORPOREAL SHOCK WAVE LITHOTRIPSY Left 08-29-2013;   07-28-2013  . EXTRACORPOREAL SHOCK WAVE LITHOTRIPSY Right 12/09/2018   Procedure: EXTRACORPOREAL SHOCK WAVE LITHOTRIPSY (ESWL);  Surgeon: Jerilee Field, MD;  Location: [facility];  Service: Urology;  Laterality: Right;  . HOLMIUM LASER APPLICATION Left 09/20/2013   Procedure: HOLMIUM LASER APPLICATION;  Surgeon: Antony Haste, MD;  Location: [facility];  Service: Urology;  Laterality: Left;  . INGUINAL HERNIA REPAIR Bilateral 10/16/2020   Procedure: BILATERAL OPEN INGUINAL HERNIA REPAIR WITH MESH;  Surgeon: Abigail Miyamoto, MD;  Location: [facility];  Service:  General;  Laterality: Bilateral;  LMA/TAP BLOCK  . LAPAROSCOPIC INGUINAL HERNIA REPAIR Bilateral 09-04-2010   w/ mesh  . TONSILLECTOMY  AS CHILD  . 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 TRANSTHORACIC ECHOCARDIOGRAM   05-08-2010   NORMAL LVSF/  EF 55%/  GRADE I DIASTOLIC DYSFUNCTION/  MILD BILATERAL ATRIUM ENLARGEMENT    There were no vitals filed for this visit.   Subjective Assessment - 10/29/20 2202    Subjective  Pt reports he has weakness on L side with significant difficulty regarding sensation and functional use of L thumb and index finger that has improved some since d/c from hospital. Pt also mentioned he does have arthritis and recently had carpal tunnel syndrome in his R wrist that has since resolved.    Pertinent History Carpal tunnel release of R hand (January 2021); PMHx of a-fib, gout, OA, and HLD    Patient Stated Goals Improve functional use of L hand; return to activities pt was participating in prior to onset    Currently in Pain? No/denies             [facility] OT Assessment - 10/29/20 1541      Assessment   Medical Diagnosis R CVA    Referring Provider (OT) Zannie Cove, MD    Onset Date/Surgical Date 10/21/20    Hand Dominance Right    Next MD Visit 11/26/20      Precautions   Precaution Comments Unable to lift more than 10lbs for 1 month post-sx (10/16/20)      Balance Screen   Has the patient fallen in the past 6 months No      Home  Environment   Family/patient expects to be discharged to: Private residence    Type of Home House    Lives With Spouse      Prior Function   Level of Independence Independent    Vocation Retired    Leisure tennis, shag dancing      ADL   Eating/Feeding Set up   Often needs assistance with cutting food; Pt also reports difficulty stabilizing plates/bowls with L hand when needed while eeating   Grooming Modified independent   Reports difficulty with activities requiring both hands (e.g. washing/brushing hair)   Upper Body Bathing Set up    Lower Body Bathing Set up    Upper Body Dressing Supervision/safety;Needs assist for fasteners    Lower Body Dressing Supervision/safety;Needs assist for fasteners   Requires assist from wife with  socks and shoes; Has been unable to wear standard pants due to difficulty with fasteners   Tub/Shower Transfer Equipment Grab bars   Tub/shower combo; No TTB   ADL comments Pt reports no difficulty with showering or toileting      IADL   Prior Level of Function Light Housekeeping Independent    Light Housekeeping Needs help with all home maintenance tasks    Prior Level of Function Meal Prep Min engagement with meal prep/cooking prior to CVA    Prior Level of Function Community Mobility Driving own vehicle    Community Mobility Relies on family or friends for transportation   OT recommended receiving prior physician approval if/when pt feels he is ready to return to driving   Prior Level of Function Medication Managment Independent  Medication Management Takes responsibility if medication is prepared in advance in seperate dosage    Prior Level of Function Financial Management Independent    Financial Management Requires supervision/minimal cuing      Vision - History   Baseline Vision Wears glasses only for reading      Vision Assessment   Ocular Range of Motion Within Functional Limits    Tracking/Visual Pursuits Impaired - to be further tested in functional context   Decreased smoothness in all planes; Pt reported no diplopia or double vision   Visual Fields No apparent deficits      Cognition   Overall Cognitive Status Within Functional Limits for tasks assessed      Sensation   Light Touch Appears Intact    Hot/Cold Appears Intact      Coordination   Fine Motor Movements are Fluid and Coordinated No    Coordination and Movement Description Decreased FMC with L hand, particularly with thumb and index finger    Finger Nose Finger Test [facility] with R hand; Mild dysmetria observed with L hand    Right 9 Hole Peg Test 27 sec    Left 9 Hole Peg Test Pt attempted and was unable to complete due to decrease FMC   Pt was able to pinch and retrieve small pegs when OT stabilized peg  vertically, but was unable to retrieve pegs from group in dish   Box and Blocks R hand (46 blocks); L hand (29 blocks)      AROM   Overall AROM  Within functional limits for tasks performed   shoulder, elbow, wrist, and hand   Overall AROM Comments Will continue to assess in functional context      Strength   Overall Strength Deficits    Overall Strength Comments Will continue to assess in functional context      Hand Function   Right Hand Grip (lbs) 42 lbs    Right Hand Lateral Pinch 19 lbs    Right Hand 3 Point Pinch 15 lbs    Left Hand Grip (lbs) 40 lbs    Left Hand Lateral Pinch 10 lbs    Left 3 point pinch 8 lbs   Demo'd difficulty maintaining test position           OT Education - 10/29/20 2249    Education Details Education provided on role and purpose of occupational therapy, as well as potential goals for therapy and further condition-specific education    Person(s) Educated Patient;Spouse    Methods Explanation    Comprehension Verbalized understanding            OT Short Term Goals - 10/29/20 2306      OT SHORT TERM GOAL #1   Title Pt will independently identify and demonstrate understanding of at least 3 compensatory/adaptive strategies, including AE, to be incorporated during BADLs    Baseline No currect compensatory/adaptive strategies    Time 4    Period Weeks    Status New    Target Date 11/30/20      OT SHORT TERM GOAL #2   Title Pt will improve functional FMC for manipulating clothing fasteners as evidenced by placing at least 50% of easy-grip pegs into pegboard independently    Baseline Unable to complete 9-Hole Peg Test due to decreased FMC/dexterity    Time 4    Period Weeks    Status New             OT Long Term Goals -  10/29/20 2308      OT LONG TERM GOAL #1   Title Pt will be independent with HEP designed for strengthening and coordination of LUE and report carryover at home    Baseline No current HEP    Time 8    Period Weeks     Status New    Target Date 12/28/20      OT LONG TERM GOAL #2   Title Pt will complete UB/LB dressing with Mod I    Baseline SPV/Safety; unable to manipulate fasteners    Time 8    Period Weeks    Status New      OT LONG TERM GOAL #3   Title Pt will complete symmetrical/asymmetrical bilateral coordination activities with Mod I at least 75% of the time to improve participation in grooming tasks    Baseline Pt reports difficulty using left hand to stabilize during tasks required both hands    Time 8    Period Weeks    Status New      OT LONG TERM GOAL #4   Title Pt will improve functional LUE use to facilitate return to leisure activities as evidenced by increasing Box and Block Test by at least 10 blocks with L hand    Baseline Box and Block Test: R hand (46 blocks); L hand (29 blocks)    Time 8    Period Weeks    Status New            Plan - 10/29/20 2254    Clinical Impression Statement Pt is a 75 y.o. 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 PMHx includes a-fib, arthritis, carpal tunnel syndrom with subsequent release, HLD, GERD, and gout. Per [facility] H&P: "He had bilateral inquinal hernia repair 10/16/20. EKG 10/16/20 revealed A. Fib. Patient was not anticoagulated. This AM he awoke at approximately 7:45 and noted left facial droop and left arm weakness after morning nap. Wife reports she noticed symptoms at 11:30 AM." Pt currently lives with his wife and requires some assistance during daily activities. Pt would benefit from skilled occupational therapy services to [facility] and dexterity, decreased strength, decreased coordination, visual-perceptual skills, functional UE use, sensation, and flexibility/range of motion to improve safety and participation in ADL/IADLs.    OT Occupational Profile and History Detailed Assessment- Review of Records and additional review of physical, cognitive, psychosocial history related to current  functional performance    Occupational performance deficits (Please refer to evaluation for details): ADL's;IADL's;Leisure    Body Structure / Function / Physical Skills ADL;ROM;UE functional use;FMC;Decreased knowledge of use of DME;Body mechanics;Dexterity;Sensation;GMC;Strength;Pain;Coordination;IADL;Cardiopulmonary status limiting activity;Vision    Rehab Potential Good    Clinical Decision Making Several treatment options, min-mod task modification necessary    Comorbidities Affecting Occupational Performance: Presence of comorbidities impacting occupational performance    Modification or Assistance to Complete Evaluation  Min-Moderate modification of tasks or assist with assess necessary to complete eval    OT Frequency 2x / week    OT Duration 8 weeks    OT Treatment/Interventions Self-care/ADL training;Electrical Stimulation;Therapeutic exercise;Visual/perceptual remediation/compensation;Moist Heat;Neuromuscular education;Patient/family education;Energy conservation;Fluidtherapy;Therapeutic activities;Cryotherapy;Paraffin;DME and/or AE instruction;Contrast Bath;Manual Therapy;Passive range of motion;Cognitive remediation/compensation    Plan Initiate HEP    Consulted and Agree with Plan of Care Patient;Family member/caregiver    Family Member Consulted Wife           Patient will benefit from skilled therapeutic intervention in order to improve the following deficits and  impairments:   Body Structure / Function / Physical Skills: ADL,ROM,UE functional use,FMC,Decreased knowledge of use of DME,Body mechanics,Dexterity,Sensation,GMC,Strength,Pain,Coordination,IADL,Cardiopulmonary status limiting activity,Vision       Visit Diagnosis: Hemiplegia and hemiparesis following cerebral infarction affecting left non-dominant side (HCC)  Other lack of coordination  Muscle weakness (generalized)  Paresthesia of skin  Other symptoms and signs involving the nervous system    Problem  List Patient Active Problem List   Diagnosis Date Noted  . CVA (cerebral vascular accident) (HCC) 10/21/2020  . Bilateral primary osteoarthritis of knee 02/15/2020  . Acquired trigger finger of right little finger 11/03/2019  . Encounter for orthopedic follow-up care 10/13/2019  . H/O total hip arthroplasty, bilateral 10/24/2018  . Gout 08/18/2017  . Overweight (BMI 25.0-29.9) 01/23/2017  . Foot pain, left 02/11/2016  . Sebaceous cyst 07/14/2014  . Sun-damaged skin 07/14/2014  . 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/14/2014  . Physical exam, annual 05/17/2012  . Inguinal hernia 08/12/2010  . 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 TOBACCO USER 05/01/2010  . Paroxysmal a-fib (HCC) 05/01/2010  . ABNORMAL ELECTROCARDIOGRAM 03/26/2010     Rosie Fate, OTR/L, MSOT 10/30/2020, 12:00 PM  [facility] [address] [place], [place], [address] Phone: [phone]   Fax:  [phone]  Name: [name] Short MRN: [id number] Date of Birth: 01/25/1946

## 2020-11-06 ENCOUNTER — Encounter: Payer: Self-pay | Admitting: Speech Pathology

## 2020-11-06 ENCOUNTER — Encounter: Payer: Self-pay | Admitting: Physical Therapy

## 2020-11-06 ENCOUNTER — Ambulatory Visit: Payer: Medicare Other | Admitting: Physical Therapy

## 2020-11-06 ENCOUNTER — Ambulatory Visit: Payer: Medicare Other | Attending: Internal Medicine | Admitting: Occupational Therapy

## 2020-11-06 ENCOUNTER — Other Ambulatory Visit: Payer: Self-pay

## 2020-11-06 ENCOUNTER — Ambulatory Visit: Payer: Medicare Other | Admitting: Speech Pathology

## 2020-11-06 DIAGNOSIS — R293 Abnormal posture: Secondary | ICD-10-CM | POA: Insufficient documentation

## 2020-11-06 DIAGNOSIS — R29818 Other symptoms and signs involving the nervous system: Secondary | ICD-10-CM | POA: Insufficient documentation

## 2020-11-06 DIAGNOSIS — I69354 Hemiplegia and hemiparesis following cerebral infarction affecting left non-dominant side: Secondary | ICD-10-CM

## 2020-11-06 DIAGNOSIS — R471 Dysarthria and anarthria: Secondary | ICD-10-CM

## 2020-11-06 DIAGNOSIS — R278 Other lack of coordination: Secondary | ICD-10-CM | POA: Diagnosis not present

## 2020-11-06 DIAGNOSIS — R2681 Unsteadiness on feet: Secondary | ICD-10-CM

## 2020-11-06 DIAGNOSIS — R1312 Dysphagia, oropharyngeal phase: Secondary | ICD-10-CM

## 2020-11-06 DIAGNOSIS — R202 Paresthesia of skin: Secondary | ICD-10-CM

## 2020-11-06 DIAGNOSIS — G8194 Hemiplegia, unspecified affecting left nondominant side: Secondary | ICD-10-CM | POA: Insufficient documentation

## 2020-11-06 DIAGNOSIS — M25571 Pain in right ankle and joints of right foot: Secondary | ICD-10-CM | POA: Insufficient documentation

## 2020-11-06 DIAGNOSIS — M6281 Muscle weakness (generalized): Secondary | ICD-10-CM

## 2020-11-06 NOTE — Therapy (Signed)
[address]. [place], [place], [address] Phone: [phone]   Fax:  [phone]  Physical Therapy Treatment  Patient Details  Name: [name] Short MRN: [id number] Date of Birth: September 22, 1946 Referring Provider (PT): Orlean Patten Date: 11/06/2020   PT End of Session - 11/06/20 1543    Visit Number 2    Date for PT Re-Evaluation 12/24/20    PT Start Time 1448    PT Stop Time 1529    PT Time Calculation (min) 41 min    Activity Tolerance Patient tolerated treatment well    Behavior During Therapy [facility] for tasks assessed/performed           Past Medical History:  Diagnosis Date  . Arthritis    WRIST  . Benign localized prostatic hyperplasia with lower urinary tract symptoms (LUTS)   . Complication of anesthesia    slow to wake  . ED (erectile dysfunction)   . GERD (gastroesophageal reflux disease)   . History of concussion    AS CHILD--  NO RESIDUAL  . History of gout   . History of kidney stones   . Hyperlipidemia   . Inguinal hernia, bilateral   . Nephrolithiasis    BILATERAL  . PAF (paroxysmal atrial fibrillation) ([place]) currently being followed by pcp   EPISODE --  2011  FOLLOW-UP W/ DR WALL  /  HAS NOT SEEN ANY CARDIOLOGIST SINCE    Past Surgical History:  Procedure Laterality Date  . APPENDECTOMY  1983  . CARDIOVASCULAR STRESS TEST  05-08-2010  DR WALL   NO EVIDENCE OF SCAR OR ISCHEMIA/ EF 54%/  PT HAD BOTH AFIB/ AFLUTTER DURING STUDY  . CARPAL TUNNEL RELEASE Right 01/ 14/ 2021  . CYSTOSCOPY W/ URETERAL STENT PLACEMENT Left 09/20/2013   Procedure: CYSTOSCOPY WITH STENT REPLACEMENT;  Surgeon: Fredricka Bonine, MD;  Location: [facility];  Service: Urology;  Laterality: Left;  . CYSTOSCOPY WITH URETEROSCOPY Left 09/20/2013   Procedure: CYSTOSCOPY WITH URETEROSCOPY;  Surgeon: Fredricka Bonine, MD;  Location: [facility];  Service: Urology;   Laterality: Left;  . CYSTOSCOPY WITH URETEROSCOPY AND STENT PLACEMENT Left 07/12/2013   Procedure: CYSTOSCOPY WITH LITHOLAPEXY, LEFT URETERAL STENT PLACEMENT;  Surgeon: Fredricka Bonine, MD;  Location: [facility];  Service: Urology;  Laterality: Left;  . CYSTOSCOPY WITH URETEROSCOPY AND STENT PLACEMENT Bilateral 06/23/2014   Procedure: BILATERAL URETEROSCOPY, HOLMIUM LASER LITHOTRIPSY AND STENT PLACEMENT, RIGHT ;  Surgeon: Festus Aloe, MD;  Location: [facility];  Service: Urology;  Laterality: Bilateral;  . EXTRACORPOREAL SHOCK WAVE LITHOTRIPSY  X2  . EXTRACORPOREAL SHOCK WAVE LITHOTRIPSY Left 08-29-2013;   07-28-2013  . EXTRACORPOREAL SHOCK WAVE LITHOTRIPSY Right 12/09/2018   Procedure: EXTRACORPOREAL SHOCK WAVE LITHOTRIPSY (ESWL);  Surgeon: Festus Aloe, MD;  Location[facility];  Service: Urology;  Laterality: Right;  . HOLMIUM LASER APPLICATION Left 83/38/2505   Procedure: HOLMIUM LASER APPLICATION;  Surgeon: Fredricka Bonine, MD;  Location: [facility];  Service: Urology;  Laterality: Left;  . INGUINAL HERNIA REPAIR Bilateral 10/16/2020   Procedure: BILATERAL OPEN INGUINAL HERNIA REPAIR WITH MESH;  Surgeon: Coralie Keens, MD;  Location: [place];  Service: General;  Laterality: Bilateral;  LMA/TAP BLOCK  . LAPAROSCOPIC INGUINAL HERNIA REPAIR Bilateral 09-04-2010   w/ mesh  . TONSILLECTOMY  AS CHILD  . 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 TOTAL HIP ARTHROPLASTY Right 11/15/2012   Procedure: RIGHT TOTAL  HIP ARTHROPLASTY ANTERIOR APPROACH;  Surgeon: Marybelle Killings, MD;  Location: [place];  Service: Orthopedics;  Laterality: Right;  Right total hip arthroplasty  . TRANSTHORACIC ECHOCARDIOGRAM  05-08-2010   NORMAL LVSF/  EF 88%/  GRADE I DIASTOLIC DYSFUNCTION/  MILD BILATERAL ATRIUM ENLARGEMENT    There were no vitals filed for this visit.   Subjective Assessment - 11/06/20 1447    Subjective Pt reports  that he is doing well today, no changes    Currently in Pain? No/denies                             [facility] Adult PT Treatment/Exercise - 11/06/20 0001      High Level Balance   High Level Balance Activities Side stepping;Backward walking;Marching forwards;Marching backwards    High Level Balance Comments sidestepping on foam, marching on airex, forward step ups on airex      Exercises   Exercises Knee/Hip      Knee/Hip Exercises: Aerobic   Nustep L5 x 6 min      Knee/Hip Exercises: Machines for Strengthening   Cybex Knee Extension 10# 2x10 BLE    Cybex Knee Flexion 25# 2x10 BLE      Knee/Hip Exercises: Standing   Walking with Sports Cord 20# x4 each direction    Other Standing Knee Exercises alt step taps 6" stair no HR 2x10 B      Knee/Hip Exercises: Seated   Sit to Sand 20 reps;without UE support   2x10 with yellow ball chest press                   PT Short Term Goals - 11/06/20 1547      PT SHORT TERM GOAL #1   Title Pt will be independent with initial HEP    Time 2    Period Weeks    Status Partially Met    Target Date 11/12/20             PT Long Term Goals - 10/29/20 1758      PT LONG TERM GOAL #1   Title Pt will be able to complete gait with vertical and horizontal head turns without LOB and with minimal to no gait deviations to facilitate safe negotiation of busy community environments.    Time 8    Period Weeks    Status New    Target Date 12/24/20      PT LONG TERM GOAL #2   Title Pt will complete 5TSTS in no greater than 12 seconds to demo improved BLE strength and funcitonal balance.    Time 8    Period Weeks    Status New    Target Date 12/24/20      PT LONG TERM GOAL #3   Title Pt will be able to negotiate standard height steps safely with alternating pattern and no HR without LOB.    Time 8    Period Weeks    Status New    Target Date 12/24/20      PT LONG TERM GOAL #4   Title Pt will be able to hold MCTSIB  condition #4 balance with feet together and EC on foam for at least 20 seconds with minimal sway and no LOB    Time 8    Period Weeks    Status New    Target Date 12/24/20  Plan - 11/06/20 1543    Clinical Impression Statement Progressed to TE well; demos mild LLE strength deficits with machine interventions. Instability with sidestepping on LLE. Functional strength WFL; some difficulty with high level balance ex's. Extensive balance training with most difficulty marching backwards requiring occasional CGA-minA. Continue to push high level balance and functional ex's; pt would like to return to playing tennis and safely doing yardwork.    PT Treatment/Interventions ADLs/Self Care Home Management;Biofeedback;Electrical Stimulation;Cryotherapy;Moist Heat;Neuromuscular re-education;Balance training;Therapeutic exercise;Therapeutic activities;Functional mobility training;Stair training;Gait training;Patient/family education;Manual techniques;Energy conservation;Passive range of motion;Taping;Vasopneumatic Device    PT Next Visit Plan Take baseline VS pre activity and as needed. Focus of sessions on endurance, higher level balance training, and general strengthening.    Consulted and Agree with Plan of Care Patient           Patient will benefit from skilled therapeutic intervention in order to improve the following deficits and impairments:  Abnormal gait,Decreased coordination,Decreased endurance,Decreased activity tolerance,Pain,Improper body mechanics,Impaired flexibility,Decreased balance,Decreased mobility,Decreased strength,Postural dysfunction  Visit Diagnosis: Muscle weakness (generalized)  Unsteadiness on feet  Abnormal posture     Problem List Patient Active Problem List   Diagnosis Date Noted  . CVA (cerebral vascular accident) ([place]) 10/21/2020  . Bilateral primary osteoarthritis of knee 02/15/2020  . Acquired trigger finger of right little finger  11/03/2019  . Encounter for orthopedic follow-up care 10/13/2019  . H/O total hip arthroplasty, bilateral 10/24/2018  . Gout 08/18/2017  . Overweight (BMI 25.0-29.9) 01/23/2017  . Foot pain, left 02/11/2016  . Sebaceous cyst 07/14/2014  . Sun-damaged skin 07/14/2014  . 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/14/2014  . Physical exam, annual 05/17/2012  . Inguinal hernia 08/12/2010  . 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 TOBACCO USER 05/01/2010  . Paroxysmal a-fib ([place]) 05/01/2010  . ABNORMAL ELECTROCARDIOGRAM 03/26/2010   Amador Cunas, PT, DPT [place] Espinoza 11/06/2020, 3:48 PM  [address]. [place], [place], [address] Phone: [phone]   Fax:  [phone]  Name: [name] Short MRN: [id number] Date of Birth: [birth year]

## 2020-11-06 NOTE — Therapy (Signed)
[facility] [address] [place], [place], [address] Phone: [phone]   Fax:  [phone]  Speech Language Pathology Treatment  Patient Details  Name: [name] Short MRN: [id number] Date of Birth: [birth year] Referring Provider (SLP): Zannie Cove, MD   Encounter Date: 11/06/2020   End of Session - 11/06/20 1444    Visit Number 2    Number of Visits 17    Date for SLP Re-Evaluation 11/26/20    SLP Start Time 1400    SLP Stop Time  1445    SLP Time Calculation (min) 45 min    Activity Tolerance Patient tolerated treatment well           Past Medical History:  Diagnosis Date  . Arthritis    WRIST  . Benign localized prostatic hyperplasia with lower urinary tract symptoms (LUTS)   . Complication of anesthesia    slow to wake  . ED (erectile dysfunction)   . GERD (gastroesophageal reflux disease)   . History of concussion    AS CHILD--  NO RESIDUAL  . History of gout   . History of kidney stones   . Hyperlipidemia   . Inguinal hernia, bilateral   . Nephrolithiasis    BILATERAL  . PAF (paroxysmal atrial fibrillation) (HCC) currently being followed by pcp   EPISODE --  2011  FOLLOW-UP W/ DR WALL  /  HAS NOT SEEN ANY CARDIOLOGIST SINCE    Past Surgical History:  Procedure Laterality Date  . APPENDECTOMY  1983  . CARDIOVASCULAR STRESS TEST  05-08-2010  DR WALL   NO EVIDENCE OF SCAR OR ISCHEMIA/ EF 54%/  PT HAD BOTH AFIB/ AFLUTTER DURING STUDY  . CARPAL TUNNEL RELEASE Right 01/ 14/ 2021  . CYSTOSCOPY W/ URETERAL STENT PLACEMENT Left 09/20/2013   Procedure: CYSTOSCOPY WITH STENT REPLACEMENT;  Surgeon: Antony Haste, MD;  Location: [facility];  Service: Urology;  Laterality: Left;  . CYSTOSCOPY WITH URETEROSCOPY Left 09/20/2013   Procedure: CYSTOSCOPY WITH URETEROSCOPY;  Surgeon: Antony Haste, MD;  Location: [facility];  Service: Urology;  Laterality: Left;   . CYSTOSCOPY WITH URETEROSCOPY AND STENT PLACEMENT Left 07/12/2013   Procedure: CYSTOSCOPY WITH LITHOLAPEXY, LEFT URETERAL STENT PLACEMENT;  Surgeon: Antony Haste, MD;  Location[facility];  Service: Urology;  Laterality: Left;  . CYSTOSCOPY WITH URETEROSCOPY AND STENT PLACEMENT Bilateral 06/23/2014   Procedure: BILATERAL URETEROSCOPY, HOLMIUM LASER LITHOTRIPSY AND STENT PLACEMENT, RIGHT ;  Surgeon: Jerilee Field, MD;  Location: [facility];  Service: Urology;  Laterality: Bilateral;  . EXTRACORPOREAL SHOCK WAVE LITHOTRIPSY  X2  . EXTRACORPOREAL SHOCK WAVE LITHOTRIPSY Left 08-29-2013;   07-28-2013  . EXTRACORPOREAL SHOCK WAVE LITHOTRIPSY Right 12/09/2018   Procedure: EXTRACORPOREAL SHOCK WAVE LITHOTRIPSY (ESWL);  Surgeon: Jerilee Field, MD;  Location: [facility];  Service: Urology;  Laterality: Right;  . HOLMIUM LASER APPLICATION Left 09/20/2013   Procedure: HOLMIUM LASER APPLICATION;  Surgeon: Antony Haste, MD;  Location: [facility];  Service: Urology;  Laterality: Left;  . INGUINAL HERNIA REPAIR Bilateral 10/16/2020   Procedure: BILATERAL OPEN INGUINAL HERNIA REPAIR WITH MESH;  Surgeon: Abigail Miyamoto, MD;  Location: [facility];  Service: General;  Laterality: Bilateral;  LMA/TAP BLOCK  . LAPAROSCOPIC INGUINAL HERNIA REPAIR Bilateral 09-04-2010   w/ mesh  . TONSILLECTOMY  AS CHILD  . 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 TOTAL HIP ARTHROPLASTY Right 11/15/2012   Procedure: RIGHT TOTAL  HIP ARTHROPLASTY ANTERIOR APPROACH;  Surgeon: Eldred Manges, MD;  Location: [facility];  Service: Orthopedics;  Laterality: Right;  Right total hip arthroplasty  . TRANSTHORACIC ECHOCARDIOGRAM  05-08-2010   NORMAL LVSF/  EF 55%/  GRADE I DIASTOLIC DYSFUNCTION/  MILD BILATERAL ATRIUM ENLARGEMENT    There were no vitals filed for this visit.   Subjective Assessment - 11/06/20 1340    Subjective "It is hard to speak and it  just doesn't feel natural."                 ADULT SLP TREATMENT - 11/06/20 0001      Treatment Provided   Treatment provided Dysphagia   Dysarthria     Dysphagia Treatment   Patient observed directly with PO's Yes    Type of PO's observed Regular    Liquids provided via Straw    Oral Phase Signs & Symptoms Prolonged mastication    Other treatment/comments rec upgrade to regular and thin liquids      Cognitive-Linquistic Treatment   Treatment focused on Dysarthria    Skilled Treatment Tifofsky's 50-word Intellibility screen completed. SLP was able to understand 20/24 (second set list). Completed word list for intellibility drills. Pt reported a small amount of fatigue towards end of word list. Speech agility drills completed 7x5, "wow these are hard".      Dysphagia Recommendations   Diet recommendations Thin liquid;Regular    Liquids provided via Straw    Medication Administration Whole meds with liquid    Supervision Patient able to self feed    Compensations Check for pocketing    Postural Changes and/or Swallow Maneuvers Seated upright 90 degrees      Progression Toward Goals   Progression toward goals Progressing toward goals            SLP Education - 11/06/20 1443    Education Details Provided edu on dysarthria compensatory strategies    Person(s) Educated Patient    Methods Demonstration;Explanation    Comprehension Verbalized understanding;Need further instruction            SLP Short Term Goals - 11/06/20 1446      SLP SHORT TERM GOAL #1   Title The patient will use 2 of the following (over articulation/slow rate/writing key word/elongation of the vowel/increased loudness/phrasing) strategies to improve speech intelligibility in sentences given a single verbal cue.    Baseline Pt does not use strategies.    Time 3    Period Weeks    Status New      SLP SHORT TERM GOAL #2   Title The patient will improve respiratory support for the production of  sentences given a single verbal or visual cue.    Baseline Poor respiratory support; quietness of speech    Time 3    Period Weeks    Status New      SLP SHORT TERM GOAL #3   Title Further assessment of swallowing warranted    Baseline Unable to assess this session.    Time 0    Period Weeks    Status Achieved            SLP Long Term Goals - 11/06/20 1449      SLP LONG TERM GOAL #1   Title Patient will develop functional and intelligible speech by utilizing compensatory strategies to address the use of adequate labial and lingual function, respiratory support, and increased articulatory precision for speech.    Baseline Does not use strategies.  Time 7    Period Weeks    Status On-going            Plan - 11/06/20 1445    Clinical Impression Statement Pt is a 75 yo male seen following CVA. Pt w/ complaints of speech impairment. L facial drooping and decreased sensitivity noted this assessment. SLP assessed cognitive-communication via SLUMS and found pt to be [facility]. Speech was assessed and found to be impaired. Pt currently presents with mild dysarthria characterized by imprecise consonant production, slow speech rate, and reduced loudness. Speech intelligibility noted to be 75% in a known context. Unable to assess swallowing this date. SLP assessed swallowing and rec regular diet with thin liquids.    Speech Therapy Frequency 1x /week    Duration 8 weeks    Treatment/Interventions Compensatory strategies;Functional tasks;Patient/family education;Environmental controls;Multimodal communcation approach;Internal/external aids;SLP instruction and feedback;Compensatory techniques    SLP Home Exercise Plan Dysarthria strategies    Consulted and Agree with Plan of Care Patient    Family Member Consulted --           Patient will benefit from skilled therapeutic intervention in order to improve the following deficits and impairments:   Dysarthria and anarthria  Oropharyngeal  dysphagia    Problem List Patient Active Problem List   Diagnosis Date Noted  . CVA (cerebral vascular accident) (HCC) 10/21/2020  . Bilateral primary osteoarthritis of knee 02/15/2020  . Acquired trigger finger of right little finger 11/03/2019  . Encounter for orthopedic follow-up care 10/13/2019  . H/O total hip arthroplasty, bilateral 10/24/2018  . Gout 08/18/2017  . Overweight (BMI 25.0-29.9) 01/23/2017  . Foot pain, left 02/11/2016  . Sebaceous cyst 07/14/2014  . Sun-damaged skin 07/14/2014  . 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/14/2014  . Physical exam, annual 05/17/2012  . Inguinal hernia 08/12/2010  . 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 TOBACCO USER 05/01/2010  . Paroxysmal a-fib (HCC) 05/01/2010  . ABNORMAL ELECTROCARDIOGRAM 03/26/2010    Dorena Bodo, MS CCC-SLP/CBIS 11/06/2020, 3:56 PM  [facility] [address] [place], [place], [address] Phone: [phone]   Fax:  [phone]   Name: [name] Short MRN: [id number] Date of Birth: July 06, 1946

## 2020-11-06 NOTE — Therapy (Signed)
[facility] [address] [place], [place], [address] Phone: [phone]   Fax:  [phone]  Occupational Therapy Treatment  Patient Details  Name: [name] Short MRN: [id number] Date of Birth: 09/28/1946 Referring Provider (OT): Zannie Cove, MD   Encounter Date: 11/06/2020   OT End of Session - 11/06/20 2035    Visit Number 2    Number of Visits 17    Authorization Type Medicare Part A and B    Progress Note Due on Visit 10    OT Start Time 1325   Pt arrived 10 min late to session   OT Stop Time 1359    OT Time Calculation (min) 34 min    Equipment Utilized During Treatment 9-Hole Peg Test, Box and Blocks Test, goniometer, grip and pinch dynamometer    Activity Tolerance Patient tolerated treatment well    Behavior During Therapy [facility] for tasks assessed/performed           Past Medical History:  Diagnosis Date  . Arthritis    WRIST  . Benign localized prostatic hyperplasia with lower urinary tract symptoms (LUTS)   . Complication of anesthesia    slow to wake  . ED (erectile dysfunction)   . GERD (gastroesophageal reflux disease)   . History of concussion    AS CHILD--  NO RESIDUAL  . History of gout   . History of kidney stones   . Hyperlipidemia   . Inguinal hernia, bilateral   . Nephrolithiasis    BILATERAL  . PAF (paroxysmal atrial fibrillation) (HCC) currently being followed by pcp   EPISODE --  2011  FOLLOW-UP W/ DR WALL  /  HAS NOT SEEN ANY CARDIOLOGIST SINCE    Past Surgical History:  Procedure Laterality Date  . APPENDECTOMY  1983  . CARDIOVASCULAR STRESS TEST  05-08-2010  DR WALL   NO EVIDENCE OF SCAR OR ISCHEMIA/ EF 54%/  PT HAD BOTH AFIB/ AFLUTTER DURING STUDY  . CARPAL TUNNEL RELEASE Right 01/ 14/ 2021  . CYSTOSCOPY W/ URETERAL STENT PLACEMENT Left 09/20/2013   Procedure: CYSTOSCOPY WITH STENT REPLACEMENT;  Surgeon: Antony Haste, MD;  Location: [facility];   Service: Urology;  Laterality: Left;  . CYSTOSCOPY WITH URETEROSCOPY Left 09/20/2013   Procedure: CYSTOSCOPY WITH URETEROSCOPY;  Surgeon: Antony Haste, MD;  Location: [facility];  Service: Urology;  Laterality: Left;  . CYSTOSCOPY WITH URETEROSCOPY AND STENT PLACEMENT Left 07/12/2013   Procedure: CYSTOSCOPY WITH LITHOLAPEXY, LEFT URETERAL STENT PLACEMENT;  Surgeon: Antony Haste, MD;  Location: [facility];  Service: Urology;  Laterality: Left;  . CYSTOSCOPY WITH URETEROSCOPY AND STENT PLACEMENT Bilateral 06/23/2014   Procedure: BILATERAL URETEROSCOPY, HOLMIUM LASER LITHOTRIPSY AND STENT PLACEMENT, RIGHT ;  Surgeon: Jerilee Field, MD;  Location: [facility];  Service: Urology;  Laterality: Bilateral;  . EXTRACORPOREAL SHOCK WAVE LITHOTRIPSY  X2  . EXTRACORPOREAL SHOCK WAVE LITHOTRIPSY Left 08-29-2013;   07-28-2013  . EXTRACORPOREAL SHOCK WAVE LITHOTRIPSY Right 12/09/2018   Procedure: EXTRACORPOREAL SHOCK WAVE LITHOTRIPSY (ESWL);  Surgeon: Jerilee Field, MD;  [facility];  Service: Urology;  Laterality: Right;  . HOLMIUM LASER APPLICATION Left 09/20/2013   Procedure: HOLMIUM LASER APPLICATION;  Surgeon: Antony Haste, MD;  Location: [facility];  Service: Urology;  Laterality: Left;  . INGUINAL HERNIA REPAIR Bilateral 10/16/2020   Procedure: BILATERAL OPEN INGUINAL HERNIA REPAIR WITH MESH;  Surgeon: Abigail Miyamoto, MD;  Location: [facility];  Service: General;  Laterality: Bilateral;  LMA/TAP BLOCK  . LAPAROSCOPIC INGUINAL HERNIA REPAIR Bilateral 09-04-2010   w/ mesh  . TONSILLECTOMY  AS CHILD  . 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 TRANSTHORACIC  ECHOCARDIOGRAM  05-08-2010   NORMAL LVSF/  EF 55%/  GRADE I DIASTOLIC DYSFUNCTION/  MILD BILATERAL ATRIUM ENLARGEMENT    There were no vitals filed for this visit.   Subjective Assessment - 11/06/20 2030    Subjective  Pt reports he has been doing well and that [facility] continues to be challenging, particularly with dressing tasks.    Pertinent History Carpal tunnel release of R hand (January 2021); PMHx of a-fib, gout, OA, and HLD    Patient Stated Goals Improve functional use of L hand; return to activities pt was participating in prior to onset    Currently in Pain? No/denies            OT Treatments/Exercises (OP) - 11/06/20 2121      Fine Motor Coordination (Hand/Wrist)   Large Pegboard Placing easy-grip pegs into large pegboard with L hand and taking them back out one at a time; pt instructed to attempt isolating tripod pinch when pulling pegs out   v/c required to facilitate in-hand manipulation to position peg upright and use fingers to push pegs into board opposed to using weight of hand   Manipulation of small objects Threading cut straws onto small string   pt demo'd difficulty picking up straw pieces and pulling thread through with L hand; OT problem-solved with pt how to adjust pinch prehension to effectively retrieve manipulatives           OT Education - 11/06/20 2032    Education Details OT provided condition-specific education and reviewed goals for occupational therapy with pt.    Person(s) Educated Patient    Methods Explanation    Comprehension Verbalized understanding            OT Short Term Goals - 11/06/20 2118      OT SHORT TERM GOAL #1   Title Pt will independently identify and demonstrate understanding of at least 3 compensatory/adaptive strategies, including AE, to be incorporated during BADLs    Baseline No currect compensatory/adaptive strategies    Time 4    Period Weeks    Status On-going    Target Date 11/30/20      OT SHORT TERM GOAL #2    Title Pt will improve functional FMC for manipulating clothing fasteners as evidenced by placing at least 50% of easy-grip pegs into pegboard independently    Baseline Unable to complete 9-Hole Peg Test due to decreased FMC/dexterity    Time 4    Period Weeks    Status On-going             OT Long Term Goals - 11/06/20 2119      OT LONG TERM GOAL #1   Title Pt will be independent with HEP designed for strengthening and coordination of LUE and report carryover at home    Baseline No current HEP    Time 8    Period Weeks    Status On-going      OT LONG TERM GOAL #2   Title Pt will complete UB/LB dressing with Mod I  Baseline SPV/Safety; unable to manipulate fasteners    Time 8    Period Weeks    Status On-going      OT LONG TERM GOAL #3   Title Pt will complete symmetrical/asymmetrical bilateral coordination activities with Mod I at least 75% of the time to improve participation in grooming tasks    Baseline Pt reports difficulty using left hand to stabilize during tasks required both hands    Time 8    Period Weeks    Status On-going      OT LONG TERM GOAL #4   Title Pt will improve functional LUE use to facilitate return to leisure activities as evidenced by increasing Box and Block Test by at least 10 blocks with L hand    Baseline Box and Block Test: R hand (46 blocks); L hand (29 blocks)    Time 8    Period Weeks    Status On-going            Plan - 11/06/20 2036    Clinical Impression Statement Pt demo's significant difficulty with [facility], primarily with isolation of thumb and index/long fingers, which is impacting participation in functional tasks. Pt reports altered sensation in L hand following median n. distribution, but sensation appears intact via gross assessment.    OT Occupational Profile and History Detailed Assessment- Review of Records and additional review of physical, cognitive, psychosocial history related to current functional performance     Occupational performance deficits (Please refer to evaluation for details): ADL's;IADL's;Leisure    Body Structure / Function / Physical Skills ADL;ROM;UE functional use;FMC;Decreased knowledge of use of DME;Body mechanics;Dexterity;Sensation;GMC;Strength;Pain;Coordination;IADL;Cardiopulmonary status limiting activity;Vision    Rehab Potential Good    Clinical Decision Making Several treatment options, min-mod task modification necessary    Comorbidities Affecting Occupational Performance: Presence of comorbidities impacting occupational performance    Modification or Assistance to Complete Evaluation  Min-Moderate modification of tasks or assist with assess necessary to complete eval    OT Frequency 2x / week    OT Duration 8 weeks    OT Treatment/Interventions Self-care/ADL training;Electrical Stimulation;Therapeutic exercise;Visual/perceptual remediation/compensation;Moist Heat;Neuromuscular education;Patient/family education;Energy conservation;Fluidtherapy;Therapeutic activities;Cryotherapy;Paraffin;DME and/or AE instruction;Contrast Bath;Manual Therapy;Passive range of motion;Cognitive remediation/compensation    Plan functional dressing tasks; introduce compensatory strategies    Consulted and Agree with Plan of Care Patient;Family member/caregiver    Family Member Consulted Wife           Patient will benefit from skilled therapeutic intervention in order to improve the following deficits and impairments:   Body Structure / Function / Physical Skills: ADL,ROM,UE functional use,FMC,Decreased knowledge of use of DME,Body mechanics,Dexterity,Sensation,GMC,Strength,Pain,Coordination,IADL,Cardiopulmonary status limiting activity,Vision       Visit Diagnosis: Hemiplegia and hemiparesis following cerebral infarction affecting left non-dominant side (HCC)  Other lack of coordination  Muscle weakness (generalized)  Paresthesia of skin  Other symptoms and signs involving the nervous  system    Problem List Patient Active Problem List   Diagnosis Date Noted  . CVA (cerebral vascular accident) (HCC) 10/21/2020  . Bilateral primary osteoarthritis of knee 02/15/2020  . Acquired trigger finger of right little finger 11/03/2019  . Encounter for orthopedic follow-up care 10/13/2019  . H/O total hip arthroplasty, bilateral 10/24/2018  . Gout 08/18/2017  . Overweight (BMI 25.0-29.9) 01/23/2017  . Foot pain, left 02/11/2016  . Sebaceous cyst 07/14/2014  . Sun-damaged skin 07/14/2014  . 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/14/2014  . Physical exam, annual 05/17/2012  . Inguinal hernia 08/12/2010  . 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  TOBACCO USER 05/01/2010  . Paroxysmal a-fib (HCC) 05/01/2010  . ABNORMAL ELECTROCARDIOGRAM 03/26/2010     Rosie Fate, OTR/L, MSOT 11/06/2020, 9:40 PM  [facility] [address] [place], [place], [address] Phone: [phone]   Fax:  [phone]  Name: [name] Short MRN: [id number] Date of Birth: 08/09/1946

## 2020-11-06 NOTE — Patient Instructions (Signed)
Read over dysarthria strategies and pick out 1 strategy that you believe helps you the most with your speech.

## 2020-11-12 ENCOUNTER — Ambulatory Visit: Payer: Medicare Other

## 2020-11-12 ENCOUNTER — Encounter: Payer: Self-pay | Admitting: Speech Pathology

## 2020-11-12 ENCOUNTER — Other Ambulatory Visit: Payer: Self-pay

## 2020-11-12 ENCOUNTER — Ambulatory Visit: Payer: Medicare Other | Admitting: Occupational Therapy

## 2020-11-12 ENCOUNTER — Encounter: Payer: Self-pay | Admitting: Occupational Therapy

## 2020-11-12 ENCOUNTER — Ambulatory Visit: Payer: Medicare Other | Admitting: Speech Pathology

## 2020-11-12 DIAGNOSIS — R471 Dysarthria and anarthria: Secondary | ICD-10-CM | POA: Diagnosis not present

## 2020-11-12 DIAGNOSIS — R2681 Unsteadiness on feet: Secondary | ICD-10-CM

## 2020-11-12 DIAGNOSIS — G8194 Hemiplegia, unspecified affecting left nondominant side: Secondary | ICD-10-CM

## 2020-11-12 DIAGNOSIS — R278 Other lack of coordination: Secondary | ICD-10-CM

## 2020-11-12 DIAGNOSIS — R202 Paresthesia of skin: Secondary | ICD-10-CM | POA: Diagnosis not present

## 2020-11-12 DIAGNOSIS — M25571 Pain in right ankle and joints of right foot: Secondary | ICD-10-CM

## 2020-11-12 DIAGNOSIS — I69354 Hemiplegia and hemiparesis following cerebral infarction affecting left non-dominant side: Secondary | ICD-10-CM | POA: Diagnosis not present

## 2020-11-12 DIAGNOSIS — M6281 Muscle weakness (generalized): Secondary | ICD-10-CM

## 2020-11-12 DIAGNOSIS — R293 Abnormal posture: Secondary | ICD-10-CM

## 2020-11-12 DIAGNOSIS — R29818 Other symptoms and signs involving the nervous system: Secondary | ICD-10-CM | POA: Diagnosis not present

## 2020-11-12 NOTE — Therapy (Signed)
[place]. [place], [place], [address] Phone: [phone]   Fax:  [phone]  Physical Therapy Treatment  Patient Details  Name: [name] Short MRN: [id number] Date of Birth: Apr 13, 1946 Referring Provider (PT): Orlean Patten Date: 11/12/2020   PT End of Session - 11/12/20 1323    Visit Number 3    Number of Visits 9    Date for PT Re-Evaluation 12/24/20    PT Start Time 1316    PT Stop Time 1356    PT Time Calculation (min) 40 min    Equipment Utilized During Treatment Gait belt    Activity Tolerance Patient tolerated treatment well    Behavior During Therapy [facility] for tasks assessed/performed           Past Medical History:  Diagnosis Date  . Arthritis    WRIST  . Benign localized prostatic hyperplasia with lower urinary tract symptoms (LUTS)   . Complication of anesthesia    slow to wake  . ED (erectile dysfunction)   . GERD (gastroesophageal reflux disease)   . History of concussion    AS CHILD--  NO RESIDUAL  . History of gout   . History of kidney stones   . Hyperlipidemia   . Inguinal hernia, bilateral   . Nephrolithiasis    BILATERAL  . PAF (paroxysmal atrial fibrillation) ([place]) currently being followed by pcp   EPISODE --  2011  FOLLOW-UP W/ DR WALL  /  HAS NOT SEEN ANY CARDIOLOGIST SINCE    Past Surgical History:  Procedure Laterality Date  . APPENDECTOMY  1983  . CARDIOVASCULAR STRESS TEST  05-08-2010  DR WALL   NO EVIDENCE OF SCAR OR ISCHEMIA/ EF 54%/  PT HAD BOTH AFIB/ AFLUTTER DURING STUDY  . CARPAL TUNNEL RELEASE Right 01/ 14/ 2021  . CYSTOSCOPY W/ URETERAL STENT PLACEMENT Left 09/20/2013   Procedure: CYSTOSCOPY WITH STENT REPLACEMENT;  Surgeon: Fredricka Bonine, MD;  Location: [facility];  Service: Urology;  Laterality: Left;  . CYSTOSCOPY WITH URETEROSCOPY Left 09/20/2013   Procedure: CYSTOSCOPY WITH URETEROSCOPY;  Surgeon: Fredricka Bonine, MD;  Location: [facility];  Service: Urology;  Laterality: Left;  . CYSTOSCOPY WITH URETEROSCOPY AND STENT PLACEMENT Left 07/12/2013   Procedure: CYSTOSCOPY WITH LITHOLAPEXY, LEFT URETERAL STENT PLACEMENT;  Surgeon: Fredricka Bonine, MD;  [facility];  Service: Urology;  Laterality: Left;  . CYSTOSCOPY WITH URETEROSCOPY AND STENT PLACEMENT Bilateral 06/23/2014   Procedure: BILATERAL URETEROSCOPY, HOLMIUM LASER LITHOTRIPSY AND STENT PLACEMENT, RIGHT ;  Surgeon: Festus Aloe, MD;  Location: [facility];  Service: Urology;  Laterality: Bilateral;  . EXTRACORPOREAL SHOCK WAVE LITHOTRIPSY  X2  . EXTRACORPOREAL SHOCK WAVE LITHOTRIPSY Left 08-29-2013;   07-28-2013  . EXTRACORPOREAL SHOCK WAVE LITHOTRIPSY Right 12/09/2018   Procedure: EXTRACORPOREAL SHOCK WAVE LITHOTRIPSY (ESWL);  Surgeon: Festus Aloe, MD;  Location: [facility];  Service: Urology;  Laterality: Right;  . HOLMIUM LASER APPLICATION Left 44/09/270   Procedure: HOLMIUM LASER APPLICATION;  Surgeon: Fredricka Bonine, MD;  Location: [facility];  Service: Urology;  Laterality: Left;  . INGUINAL HERNIA REPAIR Bilateral 10/16/2020   Procedure: BILATERAL OPEN INGUINAL HERNIA REPAIR WITH MESH;  Surgeon: Coralie Keens, MD;  Location: [place];  Service: General;  Laterality: Bilateral;  LMA/TAP BLOCK  . LAPAROSCOPIC INGUINAL HERNIA REPAIR Bilateral 09-04-2010   w/ mesh  . TONSILLECTOMY  AS CHILD  . TOTAL  HIP ARTHROPLASTY Left 05-11-2002  . TOTAL HIP ARTHROPLASTY Right 11/15/2012   Procedure: RIGHT TOTAL HIP ARTHROPLASTY ANTERIOR APPROACH;  Surgeon: Marybelle Killings, MD;  Location: [facility];  Service: Orthopedics;  Laterality: Right;  Right total hip arthroplasty  . TRANSTHORACIC ECHOCARDIOGRAM  05-08-2010   NORMAL LVSF/  EF 62%/  GRADE I DIASTOLIC DYSFUNCTION/  MILD BILATERAL ATRIUM ENLARGEMENT    There were no vitals filed for this  visit.   Subjective Assessment - 11/12/20 1320    Subjective Doing well today, just had some low back pain past few days.    Pertinent History Hx of gout with flareups to the right foot, OA, A Fib, GERD, B hip replacements, carpal tunnel last january.    How long can you sit comfortably? Gets restless when sitting for long time.    Patient Stated Goals to get back to PLOF, get stronger    Currently in Pain? Yes    Pain Score 5     Pain Location Back    Pain Orientation Lower    Pain Descriptors / Indicators Aching                             [facility] Adult PT Treatment/Exercise - 11/12/20 1322      High Level Balance   High Level Balance Activities Side stepping;Backward walking;Marching forwards;Marching backwards   marching forward with opposite hand to knee - required CGA with proprioceptive input for stability with L SLS   High Level Balance Comments sidestepping on foam, marching on airex, forward step ups on airex, standing with head turns horizontal and vertical on airex, stepping over cones forward and lateral      Knee/Hip Exercises: Aerobic   Nustep L5 x 6 min      Knee/Hip Exercises: Standing   Walking with Sports Cord 20# x 5 each direction fwd/R/L. 10# x 10 BWD    Other Standing Knee Exercises alt step taps 8" step no HR 2x10 B, x10 from Airex      Knee/Hip Exercises: Seated   Sit to Sand 20 reps;without UE support   from airex                   PT Short Term Goals - 11/06/20 1547      PT SHORT TERM GOAL #1   Title Pt will be independent with initial HEP    Time 2    Period Weeks    Status Partially Met    Target Date 11/12/20             PT Long Term Goals - 10/29/20 1758      PT LONG TERM GOAL #1   Title Pt will be able to complete gait with vertical and horizontal head turns without LOB and with minimal to no gait deviations to facilitate safe negotiation of busy community environments.    Time 8    Period Weeks    Status  New    Target Date 12/24/20      PT LONG TERM GOAL #2   Title Pt will complete 5TSTS in no greater than 12 seconds to demo improved BLE strength and funcitonal balance.    Time 8    Period Weeks    Status New    Target Date 12/24/20      PT LONG TERM GOAL #3   Title Pt will be able to negotiate standard height steps safely with alternating  pattern and no HR without LOB.    Time 8    Period Weeks    Status New    Target Date 12/24/20      PT LONG TERM GOAL #4   Title Pt will be able to hold MCTSIB condition #4 balance with feet together and EC on foam for at least 20 seconds with minimal sway and no LOB    Time 8    Period Weeks    Status New    Target Date 12/24/20                 Plan - 11/12/20 1324    Clinical Impression Statement [name] Short continues to tolerate exercises nicely. Mild LLE strength deficits with machine interventions and wth exercises requiring SLS. Instability with sidestepping on LLE. Instability also noted with forward marching with opposite hand to knee coordination when attempting SLS on LLE, But he did nicely with CGA and proprioceptive input to trunk to complete.  Some  fatigue after repeated sit to stands but able to recover after 2 min rest. Functional strength WFL; some difficulty with high level balance ex's. he will benefit from continued extensive balance training with most difficulty with prolnged SLS during dynamic activities,  marching backwards requiring occasional CGA-minA. Continue to push high level balance and functional ex's; pt would like to return to playing tennis and safely doing yardwork.    Personal Factors and Comorbidities Age;Fitness;Time since onset of injury/illness/exacerbation;Comorbidity 3+    Comorbidities Hx of gout with recent flareups to the right foot, OA, A Fib, GERD, B hip replacements, carpal tunnel last january.    Examination-Activity Limitations Locomotion Level;Stairs;Lift    Examination-Participation Restrictions  Community Activity;Interpersonal Relationship    Rehab Potential Good    PT Frequency 1x / week    PT Duration 8 weeks    PT Treatment/Interventions ADLs/Self Care Home Management;Biofeedback;Electrical Stimulation;Cryotherapy;Moist Heat;Neuromuscular re-education;Balance training;Therapeutic exercise;Therapeutic activities;Functional mobility training;Stair training;Gait training;Patient/family education;Manual techniques;Energy conservation;Passive range of motion;Taping;Vasopneumatic Device    PT Next Visit Plan Take baseline VS pre activity and as needed. Focus of sessions on endurance, higher level balance training, and general strengthening.    PT Home Exercise Plan Standing hip ABD and SLS balance with counter support (educated on proper form and completion without trunk compensations), Standing balance with head turns (advised to complete this near support surface, in a corner as needed for safety).    Consulted and Agree with Plan of Care Patient           Patient will benefit from skilled therapeutic intervention in order to improve the following deficits and impairments:  Abnormal gait,Decreased coordination,Decreased endurance,Decreased activity tolerance,Pain,Improper body mechanics,Impaired flexibility,Decreased balance,Decreased mobility,Decreased strength,Postural dysfunction  Visit Diagnosis: Muscle weakness (generalized)  Unsteadiness on feet  Abnormal posture  Hemiplegia affecting left nondominant side, unspecified etiology, unspecified hemiplegia type (HCC)  Pain in right ankle and joints of right foot     Problem List Patient Active Problem List   Diagnosis Date Noted  . CVA (cerebral vascular accident) ([place]) 10/21/2020  . Bilateral primary osteoarthritis of knee 02/15/2020  . Acquired trigger finger of right little finger 11/03/2019  . Encounter for orthopedic follow-up care 10/13/2019  . H/O total hip arthroplasty, bilateral 10/24/2018  . Gout 08/18/2017   . Overweight (BMI 25.0-29.9) 01/23/2017  . Foot pain, left 02/11/2016  . Sebaceous cyst 07/14/2014  . Sun-damaged skin 07/14/2014  . 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/14/2014  . Physical exam, annual 05/17/2012  . Inguinal hernia 08/12/2010  .  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 TOBACCO USER 05/01/2010  . Paroxysmal a-fib ([place]) 05/01/2010  . ABNORMAL ELECTROCARDIOGRAM 03/26/2010    [name] Short, PT, DPT 11/12/2020, 3:56 PM  [place]. [place], [place], [address] Phone: [phone]   Fax:  [phone]  Name: [name] Short MRN: [id number] Date of Birth: 03-May-1946

## 2020-11-12 NOTE — Patient Instructions (Signed)
Grip Strengthening (Resistive Putty)    Squeeze putty using thumb and all fingers. Repeat 15 times. Do 1-2 sessions per day.    Pinch Strengthening (Resistive Putty)    Roll out putty into snake-like shape. Pinch putty between thumb and each fingertip in turn along length of the putty    Lateral Pinch Strengthening (Resistive Putty)     Squeeze between thumb and side of each finger in turn. Repeat 10 times. Do 1-2 sessions per day.

## 2020-11-12 NOTE — Therapy (Signed)
[facility] [address]. [place], [place], [address] Phone: [phone]   Fax:  [phone]  Speech Language Pathology Treatment  Patient Details  Name: [name] Short MRN: [id number] Date of Birth: [birth year] Referring Provider (SLP): Zannie Cove, MD   Encounter Date: 11/12/2020   End of Session - 11/12/20 1656    Visit Number 3    Number of Visits 17    SLP Start Time 1446    SLP Stop Time  1533    SLP Time Calculation (min) 47 min    Activity Tolerance Patient tolerated treatment well           Past Medical History:  Diagnosis Date  . Arthritis    WRIST  . Benign localized prostatic hyperplasia with lower urinary tract symptoms (LUTS)   . Complication of anesthesia    slow to wake  . ED (erectile dysfunction)   . GERD (gastroesophageal reflux disease)   . History of concussion    AS CHILD--  NO RESIDUAL  . History of gout   . History of kidney stones   . Hyperlipidemia   . Inguinal hernia, bilateral   . Nephrolithiasis    BILATERAL  . PAF (paroxysmal atrial fibrillation) (H[facility]) currently being followed by pcp   EPISODE --  2011  FOLLOW-UP W/ DR WALL  /  HAS NOT SEEN ANY CARDIOLOGIST SINCE    Past Surgical History:  Procedure Laterality Date  . APPENDECTOMY  1983  . CARDIOVASCULAR STRESS TEST  05-08-2010  DR WALL   NO EVIDENCE OF SCAR OR ISCHEMIA/ EF 54%/  PT HAD BOTH AFIB/ AFLUTTER DURING STUDY  . CARPAL TUNNEL RELEASE Right 01/ 14/ 2021  . CYSTOSCOPY W/ URETERAL STENT PLACEMENT Left 09/20/2013   Procedure: CYSTOSCOPY WITH STENT REPLACEMENT;  Surgeon: Antony Haste, MD;  Location: [facility];  Service: Urology;  Laterality: Left;  . CYSTOSCOPY WITH URETEROSCOPY Left 09/20/2013   Procedure: CYSTOSCOPY WITH URETEROSCOPY;  Surgeon: Antony Haste, MD;  Location: [facility];  Service: Urology;  Laterality: Left;  . CYSTOSCOPY WITH URETEROSCOPY AND  STENT PLACEMENT Left 07/12/2013   Procedure: CYSTOSCOPY WITH LITHOLAPEXY, LEFT URETERAL STENT PLACEMENT;  Surgeon: Antony Haste, MD;  Location: [facility];  Service: Urology;  Laterality: Left;  . CYSTOSCOPY WITH URETEROSCOPY AND STENT PLACEMENT Bilateral 06/23/2014   Procedure: BILATERAL URETEROSCOPY, HOLMIUM LASER LITHOTRIPSY AND STENT PLACEMENT, RIGHT ;  Surgeon: Jerilee Field, MD;  Location: [facility];  Service: Urology;  Laterality: Bilateral;  . EXTRACORPOREAL SHOCK WAVE LITHOTRIPSY  X2  . EXTRACORPOREAL SHOCK WAVE LITHOTRIPSY Left 08-29-2013;   07-28-2013  . EXTRACORPOREAL SHOCK WAVE LITHOTRIPSY Right 12/09/2018   Procedure: EXTRACORPOREAL SHOCK WAVE LITHOTRIPSY (ESWL);  Surgeon: Jerilee Field, MD;  Location[facility];  Service: Urology;  Laterality: Right;  . HOLMIUM LASER APPLICATION Left 09/20/2013   Procedure: HOLMIUM LASER APPLICATION;  Surgeon: Antony Haste, MD;  Location: [facility];  Service: Urology;  Laterality: Left;  . INGUINAL HERNIA REPAIR Bilateral 10/16/2020   Procedure: BILATERAL OPEN INGUINAL HERNIA REPAIR WITH MESH;  Surgeon: Abigail Miyamoto, MD;  Location: [facility];  Service: General;  Laterality: Bilateral;  LMA/TAP BLOCK  . LAPAROSCOPIC INGUINAL HERNIA REPAIR Bilateral 09-04-2010   w/ mesh  . TONSILLECTOMY  AS CHILD  . 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 TOTAL HIP ARTHROPLASTY Right 11/15/2012   Procedure: RIGHT TOTAL HIP ARTHROPLASTY ANTERIOR APPROACH;  Surgeon: Veverly Fells  Ophelia Charter, MD;  Location[facility];  Service: Orthopedics;  Laterality: Right;  Right total hip arthroplasty  . TRANSTHORACIC ECHOCARDIOGRAM  05-08-2010   NORMAL LVSF/  EF 55%/  GRADE I DIASTOLIC DYSFUNCTION/  MILD BILATERAL ATRIUM ENLARGEMENT    There were no vitals filed for this visit.   Subjective Assessment - 11/12/20 1449    Subjective I've been working on [employer].    Currently in Pain?  No/denies                 ADULT SLP TREATMENT - 11/12/20 0001      General Information   Behavior/Cognition Cooperative;Pleasant mood      Treatment Provided   Treatment provided Cognitive-Linquistic      Cognitive-Linquistic Treatment   Treatment focused on Dysarthria    Skilled Treatment On pt oral mech, pt has shown and noticed improved lip seal at home. Pt utilized increased breath support and articulation to complete MADLIB sentences given a model. Pt speech clarity increased using compensatory strategies.      Progression Toward Goals   Progression toward goals Progressing toward goals              SLP Short Term Goals - 11/12/20 1658      SLP SHORT TERM GOAL #1   Title The patient will use 2 of the following (over articulation/slow rate/writing key word/elongation of the vowel/increased loudness/phrasing) strategies to improve speech intelligibility in sentences given a single verbal cue.    Baseline Pt does not use strategies.    Time 2    Period Weeks    Status New      SLP SHORT TERM GOAL #2   Title The patient will improve respiratory support for the production of sentences given a single verbal or visual cue.    Baseline Poor respiratory support; quietness of speech    Time 2    Period Weeks    Status New      SLP SHORT TERM GOAL #3   Title Further assessment of swallowing warranted    Baseline Unable to assess this session.    Time 0    Period Weeks    Status Achieved            SLP Long Term Goals - 11/12/20 1658      SLP LONG TERM GOAL #1   Title Patient will develop functional and intelligible speech by utilizing compensatory strategies to address the use of adequate labial and lingual function, respiratory support, and increased articulatory precision for speech.    Baseline Does not use strategies.    Time 6    Period Weeks    Status On-going            Plan - 11/12/20 1658    Clinical Impression Statement Pt is a 75 yo male  seen following CVA. Pt w/ complaints of speech impairment. L facial drooping and decreased sensitivity noted this assessment. SLP assessed cognitive-communication via SLUMS and found pt to be [facility]. Speech was assessed and found to be impaired. Pt currently presents with mild dysarthria characterized by imprecise consonant production, slow speech rate, and reduced loudness. Speech intelligibility noted to be 95% with known context.    Speech Therapy Frequency 1x /week    Duration 8 weeks    Treatment/Interventions Compensatory strategies;Functional tasks;Patient/family education;Environmental controls;Multimodal communcation approach;Internal/external aids;SLP instruction and feedback;Compensatory techniques    SLP Home Exercise Plan Dysarthria strategies    Consulted and Agree with Plan of Care Patient  Patient will benefit from skilled therapeutic intervention in order to improve the following deficits and impairments:   Dysarthria and anarthria    Problem List Patient Active Problem List   Diagnosis Date Noted  . CVA (cerebral vascular accident) (HCC) 10/21/2020  . Bilateral primary osteoarthritis of knee 02/15/2020  . Acquired trigger finger of right little finger 11/03/2019  . Encounter for orthopedic follow-up care 10/13/2019  . H/O total hip arthroplasty, bilateral 10/24/2018  . Gout 08/18/2017  . Overweight (BMI 25.0-29.9) 01/23/2017  . Foot pain, left 02/11/2016  . Sebaceous cyst 07/14/2014  . Sun-damaged skin 07/14/2014  . 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/14/2014  . Physical exam, annual 05/17/2012  . Inguinal hernia 08/12/2010  . 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 TOBACCO USER 05/01/2010  . Paroxysmal a-fib (HCC) 05/01/2010  . ABNORMAL ELECTROCARDIOGRAM 03/26/2010    Dorena Bodo, MS [place], CBIS 11/12/2020, 4:59 PM  [facility] [address] [place], [place], [address] Phone: [phone]   Fax:   [phone]   Name: [name] Short MRN: [id number] Date of Birth: May 29, 1946

## 2020-11-12 NOTE — Patient Instructions (Addendum)
Focus on adequate breath support for speech. *speak on the the TOP of your breath and let your voice out with the air vs. Let your air out some and then start your sentence Continue with utilizing compensatory strategies at home.

## 2020-11-13 NOTE — Therapy (Signed)
[facility] [address]. [place], [place], [address] Phone: [phone]   Fax:  [phone]  Occupational Therapy Treatment  Patient Details  Name: [name] Short MRN: [id number] Date of Birth: 25-Mar-1946 Referring Provider (OT): Zannie Cove, MD   Encounter Date: 11/12/2020   OT End of Session - 11/12/20 1440    Visit Number 3    Number of Visits 17    Authorization Type Medicare Part A and B    Progress Note Due on Visit 10    OT Start Time 1406   Previous session ran over   OT Stop Time 1446    OT Time Calculation (min) 40 min    Equipment Utilized During Treatment 9-Hole Peg Test, Box and Blocks Test, goniometer, grip and pinch dynamometer    Activity Tolerance Patient tolerated treatment well    Behavior During Therapy WFL for tasks assessed/performed           Past Medical History:  Diagnosis Date  . Arthritis    WRIST  . Benign localized prostatic hyperplasia with lower urinary tract symptoms (LUTS)   . Complication of anesthesia    slow to wake  . ED (erectile dysfunction)   . GERD (gastroesophageal reflux disease)   . History of concussion    AS CHILD--  NO RESIDUAL  . History of gout   . History of kidney stones   . Hyperlipidemia   . Inguinal hernia, bilateral   . Nephrolithiasis    BILATERAL  . PAF (paroxysmal atrial fibrillation) (HCC) currently being followed by pcp   EPISODE --  2011  FOLLOW-UP W/ DR WALL  /  HAS NOT SEEN ANY CARDIOLOGIST SINCE    Past Surgical History:  Procedure Laterality Date  . APPENDECTOMY  1983  . CARDIOVASCULAR STRESS TEST  05-08-2010  DR WALL   NO EVIDENCE OF SCAR OR ISCHEMIA/ EF 54%/  PT HAD BOTH AFIB/ AFLUTTER DURING STUDY  . CARPAL TUNNEL RELEASE Right 01/ 14/ 2021  . CYSTOSCOPY W/ URETERAL STENT PLACEMENT Left 09/20/2013   Procedure: CYSTOSCOPY WITH STENT REPLACEMENT;  Surgeon: Antony Haste, MD;  Location: [facility];  Service:  Urology;  Laterality: Left;  . CYSTOSCOPY WITH URETEROSCOPY Left 09/20/2013   Procedure: CYSTOSCOPY WITH URETEROSCOPY;  Surgeon: Antony Haste, MD;  Location: [facility];  Service: Urology;  Laterality: Left;  . CYSTOSCOPY WITH URETEROSCOPY AND STENT PLACEMENT Left 07/12/2013   Procedure: CYSTOSCOPY WITH LITHOLAPEXY, LEFT URETERAL STENT PLACEMENT;  Surgeon: Antony Haste, MD;  Location[facility];  Service: Urology;  Laterality: Left;  . CYSTOSCOPY WITH URETEROSCOPY AND STENT PLACEMENT Bilateral 06/23/2014   Procedure: BILATERAL URETEROSCOPY, HOLMIUM LASER LITHOTRIPSY AND STENT PLACEMENT, RIGHT ;  Surgeon: Jerilee Field, MD;  Location: [facility];  Service: Urology;  Laterality: Bilateral;  . EXTRACORPOREAL SHOCK WAVE LITHOTRIPSY  X2  . EXTRACORPOREAL SHOCK WAVE LITHOTRIPSY Left 08-29-2013;   07-28-2013  . EXTRACORPOREAL SHOCK WAVE LITHOTRIPSY Right 12/09/2018   Procedure: EXTRACORPOREAL SHOCK WAVE LITHOTRIPSY (ESWL);  Surgeon: Jerilee Field, MD;  Location: [facility];  Service: Urology;  Laterality: Right;  . HOLMIUM LASER APPLICATION Left 09/20/2013   Procedure: HOLMIUM LASER APPLICATION;  Surgeon: Antony Haste, MD;  Location: [facility];  Service: Urology;  Laterality: Left;  . INGUINAL HERNIA REPAIR Bilateral 10/16/2020   Procedure: BILATERAL OPEN INGUINAL HERNIA REPAIR WITH MESH;  Surgeon: Abigail Miyamoto, MD;  Location: [facility];  Service:  General;  Laterality: Bilateral;  LMA/TAP BLOCK  . LAPAROSCOPIC INGUINAL HERNIA REPAIR Bilateral 09-04-2010   w/ mesh  . TONSILLECTOMY  AS CHILD  . 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 TRANSTHORACIC ECHOCARDIOGRAM   05-08-2010   NORMAL LVSF/  EF 55%/  GRADE I DIASTOLIC DYSFUNCTION/  MILD BILATERAL ATRIUM ENLARGEMENT    There were no vitals filed for this visit.   Subjective Assessment - 11/12/20 1418    Subjective  Pt reports he feels like his L hand is getting stronger, but that it still feels numb in the thumb, index, and part of his long finger.    Pertinent History Carpal tunnel release of R hand (January 2021); PMHx of a-fib, gout, OA, and HLD    Patient Stated Goals Improve functional use of L hand; return to activities pt was participating in prior to onset    Currently in Pain? No/denies   Pt reports he was having low back pain earlier that has since resolved           OT Treatments/Exercises (OP) - 11/13/20 1303      Theraputty   Theraputty - Flatten Pt attempted finger extension exercises with putty and demo'd difficulty extending digits and thumb equally    Theraputty - Roll Manipulating yellow putty into a roll using B hands between fingertip pinch exercises   Included in HEP   Theraputty - Grip Full gross grasp with yellow putty completed 15x with L hand   Included in HEP   Theraputty - Pinch Fingertip pinch along length of rolled yellow putty with each finger of L hand (10x); and lateral key pinch between L index finger and thumb (15x)   Included in HEP     Neurological Re-education Exercises   Other Exercises 1 Pt retrieved and placed graded resistance clothespins in ascending order onto rim of a bowl and took each off one-by-one to improve hand strength and coordination             OT Short Term Goals - 11/06/20 2118      OT SHORT TERM GOAL #1   Title Pt will independently identify and demonstrate understanding of at least 3 compensatory/adaptive strategies, including AE, to be incorporated during BADLs    Baseline No currect compensatory/adaptive strategies    Time 4    Period Weeks    Status On-going    Target Date 11/30/20      OT SHORT TERM GOAL #2   Title Pt will  improve functional FMC for manipulating clothing fasteners as evidenced by placing at least 50% of easy-grip pegs into pegboard independently    Baseline Unable to complete 9-Hole Peg Test due to decreased FMC/dexterity    Time 4    Period Weeks    Status On-going             OT Long Term Goals - 11/06/20 2119      OT LONG TERM GOAL #1   Title Pt will be independent with HEP designed for strengthening and coordination of LUE and report carryover at home    Baseline No current HEP    Time 8    Period Weeks    Status On-going      OT LONG TERM GOAL #2   Title Pt  will complete UB/LB dressing with Mod I    Baseline SPV/Safety; unable to manipulate fasteners    Time 8    Period Weeks    Status On-going      OT LONG TERM GOAL #3   Title Pt will complete symmetrical/asymmetrical bilateral coordination activities with Mod I at least 75% of the time to improve participation in grooming tasks    Baseline Pt reports difficulty using left hand to stabilize during tasks required both hands    Time 8    Period Weeks    Status On-going      OT LONG TERM GOAL #4   Title Pt will improve functional LUE use to facilitate return to leisure activities as evidenced by increasing Box and Block Test by at least 10 blocks with L hand    Baseline Box and Block Test: R hand (46 blocks); L hand (29 blocks)    Time 8    Period Weeks    Status On-going                  Patient will benefit from skilled therapeutic intervention in order to improve the following deficits and impairments:           Visit Diagnosis: Hemiplegia and hemiparesis following cerebral infarction affecting left non-dominant side (HCC)  Other lack of coordination  Muscle weakness (generalized)  Paresthesia of skin  Other symptoms and signs involving the nervous system    Problem List Patient Active Problem List   Diagnosis Date Noted  . CVA (cerebral vascular accident) (HCC) 10/21/2020  . Bilateral  primary osteoarthritis of knee 02/15/2020  . Acquired trigger finger of right little finger 11/03/2019  . Encounter for orthopedic follow-up care 10/13/2019  . H/O total hip arthroplasty, bilateral 10/24/2018  . Gout 08/18/2017  . Overweight (BMI 25.0-29.9) 01/23/2017  . Foot pain, left 02/11/2016  . Sebaceous cyst 07/14/2014  . Sun-damaged skin 07/14/2014  . 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/14/2014  . Physical exam, annual 05/17/2012  . Inguinal hernia 08/12/2010  . 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 TOBACCO USER 05/01/2010  . Paroxysmal a-fib (HCC) 05/01/2010  . ABNORMAL ELECTROCARDIOGRAM 03/26/2010     Rosie Fate, OTR/L, MSOT 11/13/2020, 2:00 PM  [facility] [address]. [place], [place], [address] Phone: [phone]   Fax:  [phone]  Name: [name] Short MRN: [id number] Date of Birth: [birth year]

## 2020-11-19 ENCOUNTER — Ambulatory Visit: Payer: Medicare Other

## 2020-11-19 ENCOUNTER — Other Ambulatory Visit: Payer: Self-pay

## 2020-11-19 ENCOUNTER — Encounter: Payer: Self-pay | Admitting: Occupational Therapy

## 2020-11-19 ENCOUNTER — Ambulatory Visit: Payer: Medicare Other | Admitting: Occupational Therapy

## 2020-11-19 ENCOUNTER — Encounter: Payer: Self-pay | Admitting: Speech Pathology

## 2020-11-19 ENCOUNTER — Ambulatory Visit: Payer: Medicare Other | Admitting: Speech Pathology

## 2020-11-19 DIAGNOSIS — M6281 Muscle weakness (generalized): Secondary | ICD-10-CM

## 2020-11-19 DIAGNOSIS — R278 Other lack of coordination: Secondary | ICD-10-CM

## 2020-11-19 DIAGNOSIS — R293 Abnormal posture: Secondary | ICD-10-CM

## 2020-11-19 DIAGNOSIS — I69354 Hemiplegia and hemiparesis following cerebral infarction affecting left non-dominant side: Secondary | ICD-10-CM | POA: Diagnosis not present

## 2020-11-19 DIAGNOSIS — G8194 Hemiplegia, unspecified affecting left nondominant side: Secondary | ICD-10-CM

## 2020-11-19 DIAGNOSIS — R2681 Unsteadiness on feet: Secondary | ICD-10-CM

## 2020-11-19 DIAGNOSIS — M25571 Pain in right ankle and joints of right foot: Secondary | ICD-10-CM

## 2020-11-19 DIAGNOSIS — R471 Dysarthria and anarthria: Secondary | ICD-10-CM

## 2020-11-19 DIAGNOSIS — R29818 Other symptoms and signs involving the nervous system: Secondary | ICD-10-CM | POA: Diagnosis not present

## 2020-11-19 DIAGNOSIS — R202 Paresthesia of skin: Secondary | ICD-10-CM

## 2020-11-19 NOTE — Therapy (Signed)
[place]. [place], [place], [address] Phone: [phone]   Fax:  [phone]  Physical Therapy Treatment  Patient Details  Name: [name] Short MRN: [id number] Date of Birth: [birth year] Referring Provider (PT): Orlean Patten Date: 11/19/2020   PT End of Session - 11/19/20 1356    Visit Number 4    Number of Visits 9    Date for PT Re-Evaluation 12/24/20    PT Start Time 5573    PT Stop Time 1358    PT Time Calculation (min) 39 min    Equipment Utilized During Treatment Gait belt    Activity Tolerance Patient tolerated treatment well    Behavior During Therapy [facility] for tasks assessed/performed           Past Medical History:  Diagnosis Date  . Arthritis    WRIST  . Benign localized prostatic hyperplasia with lower urinary tract symptoms (LUTS)   . Complication of anesthesia    slow to wake  . ED (erectile dysfunction)   . GERD (gastroesophageal reflux disease)   . History of concussion    AS CHILD--  NO RESIDUAL  . History of gout   . History of kidney stones   . Hyperlipidemia   . Inguinal hernia, bilateral   . Nephrolithiasis    BILATERAL  . PAF (paroxysmal atrial fibrillation) ([place]) currently being followed by pcp   EPISODE --  2011  FOLLOW-UP W/ DR WALL  /  HAS NOT SEEN ANY CARDIOLOGIST SINCE    Past Surgical History:  Procedure Laterality Date  . APPENDECTOMY  1983  . CARDIOVASCULAR STRESS TEST  05-08-2010  DR WALL   NO EVIDENCE OF SCAR OR ISCHEMIA/ EF 54%/  PT HAD BOTH AFIB/ AFLUTTER DURING STUDY  . CARPAL TUNNEL RELEASE Right 01/ 14/ 2021  . CYSTOSCOPY W/ URETERAL STENT PLACEMENT Left 09/20/2013   Procedure: CYSTOSCOPY WITH STENT REPLACEMENT;  Surgeon: Fredricka Bonine, MD;  Location: [facility];  Service: Urology;  Laterality: Left;  . CYSTOSCOPY WITH URETEROSCOPY Left 09/20/2013   Procedure: CYSTOSCOPY WITH URETEROSCOPY;  Surgeon: Fredricka Bonine, MD;  Location: [facility];  Service: Urology;  Laterality: Left;  . CYSTOSCOPY WITH URETEROSCOPY AND STENT PLACEMENT Left 07/12/2013   Procedure: CYSTOSCOPY WITH LITHOLAPEXY, LEFT URETERAL STENT PLACEMENT;  Surgeon: Fredricka Bonine, MD;  Location: [facility];  Service: Urology;  Laterality: Left;  . CYSTOSCOPY WITH URETEROSCOPY AND STENT PLACEMENT Bilateral 06/23/2014   Procedure: BILATERAL URETEROSCOPY, HOLMIUM LASER LITHOTRIPSY AND STENT PLACEMENT, RIGHT ;  Surgeon: Festus Aloe, MD;  Location: [facility];  Service: Urology;  Laterality: Bilateral;  . EXTRACORPOREAL SHOCK WAVE LITHOTRIPSY  X2  . EXTRACORPOREAL SHOCK WAVE LITHOTRIPSY Left 08-29-2013;   07-28-2013  . EXTRACORPOREAL SHOCK WAVE LITHOTRIPSY Right 12/09/2018   Procedure: EXTRACORPOREAL SHOCK WAVE LITHOTRIPSY (ESWL);  Surgeon: Festus Aloe, MD;  Location: [facility];  Service: Urology;  Laterality: Right;  . HOLMIUM LASER APPLICATION Left 22/10/5425   Procedure: HOLMIUM LASER APPLICATION;  Surgeon: Fredricka Bonine, MD;  Location: [facility];  Service: Urology;  Laterality: Left;  . INGUINAL HERNIA REPAIR Bilateral 10/16/2020   Procedure: BILATERAL OPEN INGUINAL HERNIA REPAIR WITH MESH;  Surgeon: Coralie Keens, MD;  Location: [place];  Service: General;  Laterality: Bilateral;  LMA/TAP BLOCK  . LAPAROSCOPIC INGUINAL HERNIA REPAIR Bilateral 09-04-2010   w/ mesh  . TONSILLECTOMY  AS CHILD  . TOTAL  HIP ARTHROPLASTY Left 05-11-2002  . TOTAL HIP ARTHROPLASTY Right 11/15/2012   Procedure: RIGHT TOTAL HIP ARTHROPLASTY ANTERIOR APPROACH;  Surgeon: Marybelle Killings, MD;  Location: [place];  Service: Orthopedics;  Laterality: Right;  Right total hip arthroplasty  . TRANSTHORACIC ECHOCARDIOGRAM  05-08-2010   NORMAL LVSF/  EF 29%/  GRADE I DIASTOLIC DYSFUNCTION/  MILD BILATERAL ATRIUM ENLARGEMENT    There were no vitals filed for this  visit.   Subjective Assessment - 11/19/20 1322    Subjective No lifting over 15-20# for another 2 weeks per hernia surgeon. Otherwise doing well   Pertinent History Hx of gout with flareups to the right foot, OA, A Fib, GERD, B hip replacements, carpal tunnel last january.    How long can you sit comfortably? Gets restless when sitting for long time.    Patient Stated Goals to get back to PLOF, get stronger    Currently in Pain? No/denies                             [facility] Adult PT Treatment/Exercise - 11/19/20 1325      High Level Balance   High Level Balance Activities Side stepping;Backward walking;Marching forwards;Marching backwards;Direction changes   marching forward with opposite hand to knee - CS-CGA, no large LOB with L dynamic SLS today.   High Level Balance Comments sidestepping on foam, marching on airex, forward step ups on airex, standing with head turns, stepping over cones forward and lateral. walking straight path between cones with head turns, side stepping with small ball tosses      Knee/Hip Exercises: Aerobic   Recumbent Bike L1 , 5 min      Knee/Hip Exercises: Standing   Other Standing Knee Exercises alt step taps 6" step no HR 2x10 B from Airex      Knee/Hip Exercises: Seated   Sit to Sand 20 reps;without UE support   from airex                   PT Short Term Goals - 11/06/20 1547      PT SHORT TERM GOAL #1   Title Pt will be independent with initial HEP    Time 2    Period Weeks    Status Partially Met    Target Date 11/12/20             PT Long Term Goals - 10/29/20 1758      PT LONG TERM GOAL #1   Title Pt will be able to complete gait with vertical and horizontal head turns without LOB and with minimal to no gait deviations to facilitate safe negotiation of busy community environments.    Time 8    Period Weeks    Status New    Target Date 12/24/20      PT LONG TERM GOAL #2   Title Pt will complete 5TSTS in no  greater than 12 seconds to demo improved BLE strength and funcitonal balance.    Time 8    Period Weeks    Status New    Target Date 12/24/20      PT LONG TERM GOAL #3   Title Pt will be able to negotiate standard height steps safely with alternating pattern and no HR without LOB.    Time 8    Period Weeks    Status New    Target Date 12/24/20      PT LONG  TERM GOAL #4   Title Pt will be able to hold MCTSIB condition #4 balance with feet together and EC on foam for at least 20 seconds with minimal sway and no LOB    Time 8    Period Weeks    Status New    Target Date 12/24/20                 Plan - 11/19/20 1356    Clinical Impression Statement [name] Short continues to tolerate exercises nicely. Decreased Instability with sidestepping on LLE and with forward marching with opposite hand to knee coordination when attempting SLS on LLE today. Continued difficulty with EC on foam, challenge with head turns both stationary and waith walking.  Difficulty with dual task of walking with head turns while maintaining straight path (path blocked by cones on either side, required verbal cues and CGA to complete - without cues diminished ability to continue head turns, diminishing dual task performance). He will benefit from continued extensive balance training with most difficulty with prolnged SLS during dynamic activities, marching backwards requiring occasional CGA-minA. Continue to push high level balance and functional ex's; pt would like to return to playing tennis and safely doing yardwork.    Personal Factors and Comorbidities Age;Fitness;Time since onset of injury/illness/exacerbation;Comorbidity 3+    Comorbidities Hx of gout with recent flareups to the right foot, OA, A Fib, GERD, B hip replacements, carpal tunnel last january.    Examination-Activity Limitations Locomotion Level;Stairs;Lift    Examination-Participation Restrictions Community Activity;Interpersonal Relationship    Rehab  Potential Good    PT Frequency 1x / week    PT Duration 8 weeks    PT Treatment/Interventions ADLs/Self Care Home Management;Biofeedback;Electrical Stimulation;Cryotherapy;Moist Heat;Neuromuscular re-education;Balance training;Therapeutic exercise;Therapeutic activities;Functional mobility training;Stair training;Gait training;Patient/family education;Manual techniques;Energy conservation;Passive range of motion;Taping;Vasopneumatic Device    PT Next Visit Plan Take baseline VS pre activity and as needed. Focus of sessions on endurance, higher level balance training, and general strengthening.    PT Home Exercise Plan Standing hip ABD and SLS balance with counter support (educated on proper form and completion without trunk compensations), Standing balance with head turns (advised to complete this near support surface, in a corner as needed for safety).    Consulted and Agree with Plan of Care Patient           Patient will benefit from skilled therapeutic intervention in order to improve the following deficits and impairments:  Abnormal gait,Decreased coordination,Decreased endurance,Decreased activity tolerance,Pain,Improper body mechanics,Impaired flexibility,Decreased balance,Decreased mobility,Decreased strength,Postural dysfunction  Visit Diagnosis: Muscle weakness (generalized)  Unsteadiness on feet  Abnormal posture  Hemiplegia affecting left nondominant side, unspecified etiology, unspecified hemiplegia type (HCC)  Pain in right ankle and joints of right foot  Other lack of coordination     Problem List Patient Active Problem List   Diagnosis Date Noted  . CVA (cerebral vascular accident) ([place]) 10/21/2020  . Bilateral primary osteoarthritis of knee 02/15/2020  . Acquired trigger finger of right little finger 11/03/2019  . Encounter for orthopedic follow-up care 10/13/2019  . H/O total hip arthroplasty, bilateral 10/24/2018  . Gout 08/18/2017  . Overweight (BMI  25.0-29.9) 01/23/2017  . Foot pain, left 02/11/2016  . Sebaceous cyst 07/14/2014  . Sun-damaged skin 07/14/2014  . 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/14/2014  . Physical exam, annual 05/17/2012  . Inguinal hernia 08/12/2010  . 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 TOBACCO USER 05/01/2010  . Paroxysmal a-fib ([place]) 05/01/2010  . ABNORMAL ELECTROCARDIOGRAM 03/26/2010    [facility]  L Benedicto, PT, DPT 11/19/2020, 3:18 PM  [place]. [place], [place], [address] Phone: [phone]   Fax:  [phone]  Name: [name] Short MRN: [id number] Date of Birth: January 27, 1946

## 2020-11-19 NOTE — Therapy (Signed)
[facility] [address] [place], [place], [address] Phone: [phone]   Fax:  [phone]  Speech Language Pathology Treatment  Patient Details  Name: [name] Short MRN: [id number] Date of Birth: 04-09-1946 Referring Provider (SLP): Zannie Cove, MD   Encounter Date: 11/19/2020   End of Session - 11/19/20 1507    Visit Number 4    Number of Visits 17    SLP Start Time 1450    SLP Stop Time  1530    SLP Time Calculation (min) 40 min    Activity Tolerance Patient tolerated treatment well           Past Medical History:  Diagnosis Date  . Arthritis    WRIST  . Benign localized prostatic hyperplasia with lower urinary tract symptoms (LUTS)   . Complication of anesthesia    slow to wake  . ED (erectile dysfunction)   . GERD (gastroesophageal reflux disease)   . History of concussion    AS CHILD--  NO RESIDUAL  . History of gout   . History of kidney stones   . Hyperlipidemia   . Inguinal hernia, bilateral   . Nephrolithiasis    BILATERAL  . PAF (paroxysmal atrial fibrillation) (HCC) currently being followed by pcp   EPISODE --  2011  FOLLOW-UP W/ DR WALL  /  HAS NOT SEEN ANY CARDIOLOGIST SINCE    Past Surgical History:  Procedure Laterality Date  . APPENDECTOMY  1983  . CARDIOVASCULAR STRESS TEST  05-08-2010  DR WALL   NO EVIDENCE OF SCAR OR ISCHEMIA/ EF 54%/  PT HAD BOTH AFIB/ AFLUTTER DURING STUDY  . CARPAL TUNNEL RELEASE Right 01/ 14/ 2021  . CYSTOSCOPY W/ URETERAL STENT PLACEMENT Left 09/20/2013   Procedure: CYSTOSCOPY WITH STENT REPLACEMENT;  Surgeon: Antony Haste, MD;  Location: [facility];  Service: Urology;  Laterality: Left;  . CYSTOSCOPY WITH URETEROSCOPY Left 09/20/2013   Procedure: CYSTOSCOPY WITH URETEROSCOPY;  Surgeon: Antony Haste, MD;  Location: [facility];  Service: Urology;  Laterality: Left;  . CYSTOSCOPY WITH URETEROSCOPY AND  STENT PLACEMENT Left 07/12/2013   Procedure: CYSTOSCOPY WITH LITHOLAPEXY, LEFT URETERAL STENT PLACEMENT;  Surgeon: Antony Haste, MD;  Location[facility];  Service: Urology;  Laterality: Left;  . CYSTOSCOPY WITH URETEROSCOPY AND STENT PLACEMENT Bilateral 06/23/2014   Procedure: BILATERAL URETEROSCOPY, HOLMIUM LASER LITHOTRIPSY AND STENT PLACEMENT, RIGHT ;  Surgeon: Jerilee Field, MD;  Location: [facility];  Service: Urology;  Laterality: Bilateral;  . EXTRACORPOREAL SHOCK WAVE LITHOTRIPSY  X2  . EXTRACORPOREAL SHOCK WAVE LITHOTRIPSY Left 08-29-2013;   07-28-2013  . EXTRACORPOREAL SHOCK WAVE LITHOTRIPSY Right 12/09/2018   Procedure: EXTRACORPOREAL SHOCK WAVE LITHOTRIPSY (ESWL);  Surgeon: Jerilee Field, MD;  [facility];  Service: Urology;  Laterality: Right;  . HOLMIUM LASER APPLICATION Left 09/20/2013   Procedure: HOLMIUM LASER APPLICATION;  Surgeon: Antony Haste, MD;  Location: [facility];  Service: Urology;  Laterality: Left;  . INGUINAL HERNIA REPAIR Bilateral 10/16/2020   Procedure: BILATERAL OPEN INGUINAL HERNIA REPAIR WITH MESH;  Surgeon: Abigail Miyamoto, MD;  [facility];  Service: General;  Laterality: Bilateral;  LMA/TAP BLOCK  . LAPAROSCOPIC INGUINAL HERNIA REPAIR Bilateral 09-04-2010   w/ mesh  . TONSILLECTOMY  AS CHILD  . 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 TOTAL HIP ARTHROPLASTY Right 11/15/2012   Procedure: RIGHT TOTAL HIP ARTHROPLASTY ANTERIOR APPROACH;  Surgeon: Veverly Fells  Ophelia Charter, MD;  [facility];  Service: Orthopedics;  Laterality: Right;  Right total hip arthroplasty  . TRANSTHORACIC ECHOCARDIOGRAM  05-08-2010   NORMAL LVSF/  EF 55%/  GRADE I DIASTOLIC DYSFUNCTION/  MILD BILATERAL ATRIUM ENLARGEMENT    There were no vitals filed for this visit.   Subjective Assessment - 11/19/20 1456    Subjective "I have been having some dry mouth/saliva build up."    Currently  in Pain? No/denies                 ADULT SLP TREATMENT - 11/19/20 1500      General Information   Behavior/Cognition Cooperative;Pleasant mood      Treatment Provided   Treatment provided Cognitive-Linquistic      Cognitive-Linquistic Treatment   Treatment focused on Dysarthria    Skilled Treatment Provided edu on xerostomia and "at home" tx to thin out oral secretions. Told pt to consult with doctor if it does not improve. Dysarthria tx today focused on articulation.      Assessment / Recommendations / Plan   Plan Continue with current plan of care      Progression Toward Goals   Progression toward goals Progressing toward goals              SLP Short Term Goals - 11/19/20 1513      SLP SHORT TERM GOAL #1   Title The patient will use 2 of the following (over articulation/slow rate/writing key word/elongation of the vowel/increased loudness/phrasing) strategies to improve speech intelligibility in sentences given a single verbal cue.    Baseline Pt does not use strategies.    Time 1    Period Weeks    Status New      SLP SHORT TERM GOAL #2   Title The patient will improve respiratory support for the production of sentences given a single verbal or visual cue.    Baseline Poor respiratory support; quietness of speech    Time 1    Period Weeks    Status New      SLP SHORT TERM GOAL #3   Title Further assessment of swallowing warranted    Baseline Unable to assess this session.    Time 0    Period Weeks    Status Achieved            SLP Long Term Goals - 11/19/20 1513      SLP LONG TERM GOAL #1   Title Patient will develop functional and intelligible speech by utilizing compensatory strategies to address the use of adequate labial and lingual function, respiratory support, and increased articulatory precision for speech.    Baseline Does not use strategies.    Time 5    Period Weeks    Status On-going            Plan - 11/19/20 1512    Clinical  Impression Statement Pt is a 75 yo male seen following CVA. Pt w/ complaints of speech impairment. L facial drooping and decreased sensitivity noted this assessment. SLP assessed cognitive-communication via SLUMS and found pt to be [facility]. Speech was assessed and found to be impaired. Pt currently presents with mild dysarthria characterized by imprecise consonant production, slow speech rate, and reduced loudness. Speech intelligibility noted to be 100% in a known context.    Speech Therapy Frequency 1x /week    Duration 8 weeks    Treatment/Interventions Compensatory strategies;Functional tasks;Patient/family education;Environmental controls;Multimodal communcation approach;Internal/external aids;SLP instruction and feedback;Compensatory techniques  SLP Home Exercise Plan Dysarthria strategies    Consulted and Agree with Plan of Care Patient           Patient will benefit from skilled therapeutic intervention in order to improve the following deficits and impairments:   Dysarthria and anarthria    Problem List Patient Active Problem List   Diagnosis Date Noted  . CVA (cerebral vascular accident) (HCC) 10/21/2020  . Bilateral primary osteoarthritis of knee 02/15/2020  . Acquired trigger finger of right little finger 11/03/2019  . Encounter for orthopedic follow-up care 10/13/2019  . H/O total hip arthroplasty, bilateral 10/24/2018  . Gout 08/18/2017  . Overweight (BMI 25.0-29.9) 01/23/2017  . Foot pain, left 02/11/2016  . Sebaceous cyst 07/14/2014  . Sun-damaged skin 07/14/2014  . 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/14/2014  . Physical exam, annual 05/17/2012  . Inguinal hernia 08/12/2010  . 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 TOBACCO USER 05/01/2010  . Paroxysmal a-fib (HCC) 05/01/2010  . ABNORMAL ELECTROCARDIOGRAM 03/26/2010    Dorena Bodo, MS [place], CBIS 11/19/2020, 3:15 PM  [facility] [address] [place], [place],  [address] Phone: [phone]   Fax:  [phone]   Name: [name] Short MRN: [id number] Date of Birth: 1945/11/01

## 2020-11-19 NOTE — Patient Instructions (Signed)
Access Code: [email or web address] URL: [email or web address] Date: 11/19/2020 Prepared by: Adelina Mings, OTR/L  Exercises Thumb AROM Opposition To All Fingers - 2-3 x daily - 1 sets - 10 reps Finger Spreading - 2-3 x daily - 1 sets - 15 reps Finger MP Flexion AROM - 2-3 x daily - 1 sets - 15 reps Seated Claw Fist AROM - 2-3 x daily - 1 sets - 15 reps

## 2020-11-19 NOTE — Therapy (Signed)
[facility] [address] [place], [place], [id number] Phone: [phone]   Fax:  [phone]  Occupational Therapy Treatment  Patient Details  Name: [name] Short MRN: [id number] Date of Birth: 01/07/1946 Referring Provider (OT): Zannie CovePreetha Joseph, MD   Encounter Date: 11/19/2020   OT End of Session - 11/19/20 1404    Visit Number 4    Number of Visits 17    Authorization Type Medicare Part A and B    Progress Note Due on Visit 10    OT Start Time 1400    OT Stop Time 1444    OT Time Calculation (min) 44 min    Equipment Utilized During Treatment 9-Hole Peg Test, Box and Blocks Test, goniometer, grip and pinch dynamometer    Activity Tolerance Patient tolerated treatment well    Behavior During Therapy WFL for tasks assessed/performed           Past Medical History:  Diagnosis Date  . Arthritis    WRIST  . Benign localized prostatic hyperplasia with lower urinary tract symptoms (LUTS)   . Complication of anesthesia    slow to wake  . ED (erectile dysfunction)   . GERD (gastroesophageal reflux disease)   . History of concussion    AS CHILD--  NO RESIDUAL  . History of gout   . History of kidney stones   . Hyperlipidemia   . Inguinal hernia, bilateral   . Nephrolithiasis    BILATERAL  . PAF (paroxysmal atrial fibrillation) (HCC) currently being followed by pcp   EPISODE --  2011  FOLLOW-UP W/ DR WALL  /  HAS NOT SEEN ANY CARDIOLOGIST SINCE    Past Surgical History:  Procedure Laterality Date  . APPENDECTOMY  1983  . CARDIOVASCULAR STRESS TEST  05-08-2010  DR WALL   NO EVIDENCE OF SCAR OR ISCHEMIA/ EF 54%/  PT HAD BOTH AFIB/ AFLUTTER DURING STUDY  . CARPAL TUNNEL RELEASE Right 01/ 14/ 2021  . CYSTOSCOPY W/ URETERAL STENT PLACEMENT Left 09/20/2013   Procedure: CYSTOSCOPY WITH STENT REPLACEMENT;  Surgeon: Antony HasteMatthew Ramsey Eskridge, MD;  [facility];  Service: Urology;  Laterality: Left;   . CYSTOSCOPY WITH URETEROSCOPY Left 09/20/2013   Procedure: CYSTOSCOPY WITH URETEROSCOPY;  Surgeon: Antony HasteMatthew Ramsey Eskridge, MD;  [facility];  Service: Urology;  Laterality: Left;  . CYSTOSCOPY WITH URETEROSCOPY AND STENT PLACEMENT Left 07/12/2013   Procedure: CYSTOSCOPY WITH LITHOLAPEXY, LEFT URETERAL STENT PLACEMENT;  Surgeon: Antony HasteMatthew Ramsey Eskridge, MD;  [facility] [place];  Service: Urology;  Laterality: Left;  . CYSTOSCOPY WITH URETEROSCOPY AND STENT PLACEMENT Bilateral 06/23/2014   Procedure: BILATERAL URETEROSCOPY, HOLMIUM LASER LITHOTRIPSY AND STENT PLACEMENT, RIGHT ;  Surgeon: Jerilee FieldMatthew Eskridge, MD;  [facility] [place];  Service: Urology;  Laterality: Bilateral;  . EXTRACORPOREAL SHOCK WAVE LITHOTRIPSY  X2  . EXTRACORPOREAL SHOCK WAVE LITHOTRIPSY Left 08-29-2013;   07-28-2013  . EXTRACORPOREAL SHOCK WAVE LITHOTRIPSY Right 12/09/2018   Procedure: EXTRACORPOREAL SHOCK WAVE LITHOTRIPSY (ESWL);  Surgeon: Jerilee FieldEskridge, Matthew, MD;  [facility];  Service: Urology;  Laterality: Right;  . HOLMIUM LASER APPLICATION Left 09/20/2013   Procedure: HOLMIUM LASER APPLICATION;  Surgeon: Antony HasteMatthew Ramsey Eskridge, MD;  [facility] [place];  Service: Urology;  Laterality: Left;  . INGUINAL HERNIA REPAIR Bilateral 10/16/2020   Procedure: BILATERAL OPEN INGUINAL HERNIA REPAIR WITH MESH;  Surgeon: Abigail MiyamotoBlackman, Douglas, MD;  [facility] [place];  Service: General;  Laterality: Bilateral;  LMA/TAP BLOCK  . LAPAROSCOPIC INGUINAL HERNIA REPAIR Bilateral 09-04-2010   w/ mesh  . TONSILLECTOMY  AS CHILD  . 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 TRANSTHORACIC ECHOCARDIOGRAM  05-08-2010   NORMAL LVSF/  EF  55%/  GRADE I DIASTOLIC DYSFUNCTION/  MILD BILATERAL ATRIUM ENLARGEMENT    There were no vitals filed for this visit.   Subjective Assessment - 11/19/20 1401    Subjective  Pt reports he sometimes feels a "quick twinge" when he is doing certain activities with his L hand    Pertinent History Carpal tunnel release of R hand (January 2021); PMHx of a-fib, gout, OA, and HLD    Patient Stated Goals Improve functional use of L hand; return to activities pt was participating in prior to onset    Currently in Pain? No/denies            OT Treatments/Exercises (OP) - 11/19/20 1411      Shoulder Exercises: Seated   Row Strengthening;Both;20 reps   Chest press; 2lb dowel rod   Other Seated Exercises Overhead press and forward and backward circles completed w/ 2lb dowel rod; 20 reps   Pt reported dull/stretching discomfort in L biceps region that improved with decreasing weight of dowel rod from 3lbs     Hand Exercises   Other Hand Exercises AROM hand/finger exercises to be included in HEP practiced with pt to ensure understanding and appropriate body mechanics; finger spreading, thumb-to-finger opposition, MCP flexion, claw fist   Pt also instructed to re-position pieces w/ L hand only to facilitate in-hand manipulation     Visual/Perceptual Exercises   Copy this Image PVC    PVC Copying med-difficulty pattern; completed 2 patterns using L hand as working arm and R hand to stabilize as needed            OT Education - 11/19/20 1511    Education Details Education provided on new AROM hand exercises to be included in HEP    Person(s) Educated Patient    Methods Explanation;Handout    Comprehension Verbalized understanding            OT Short Term Goals - 11/06/20 2118      OT SHORT TERM GOAL #1   Title Pt will independently identify and demonstrate understanding of at least 3 compensatory/adaptive strategies, including AE, to be incorporated during BADLs    Baseline No currect  compensatory/adaptive strategies    Time 4    Period Weeks    Status On-going    Target Date 11/30/20      OT SHORT TERM GOAL #2   Title Pt will improve functional FMC for manipulating clothing fasteners as evidenced by placing at least 50% of easy-grip pegs into pegboard independently    Baseline Unable to complete 9-Hole Peg Test due to decreased FMC/dexterity    Time 4    Period Weeks    Status On-going            OT Long Term Goals - 11/06/20 2119      OT LONG TERM GOAL #1   Title Pt will be independent with HEP designed for strengthening and coordination of LUE and report carryover at home    Baseline No current HEP    Time 8  Period Weeks    Status On-going      OT LONG TERM GOAL #2   Title Pt will complete UB/LB dressing with Mod I    Baseline SPV/Safety; unable to manipulate fasteners    Time 8    Period Weeks    Status On-going      OT LONG TERM GOAL #3   Title Pt will complete symmetrical/asymmetrical bilateral coordination activities with Mod I at least 75% of the time to improve participation in grooming tasks    Baseline Pt reports difficulty using left hand to stabilize during tasks required both hands    Time 8    Period Weeks    Status On-going      OT LONG TERM GOAL #4   Title Pt will improve functional LUE use to facilitate return to leisure activities as evidenced by increasing Box and Block Test by at least 10 blocks with L hand    Baseline Box and Block Test: R hand (46 blocks); L hand (29 blocks)    Time 8    Period Weeks    Status On-going            Plan - 11/19/20 1512    Clinical Impression Statement Functional use of LUE/hand and fingers has improved since previous session with pt demo'ing increased control with in-hand manipulation tasks. Pt reports some discomfort still present in thumb, index, and long finger of L hand; OT included AROM hand exercises into HEP to facilitate increased ROM and coordination of those digits.    OT  Occupational Profile and History Detailed Assessment- Review of Records and additional review of physical, cognitive, psychosocial history related to current functional performance    Occupational performance deficits (Please refer to evaluation for details): ADL's;IADL's;Leisure    Body Structure / Function / Physical Skills ADL;ROM;UE functional use;FMC;Decreased knowledge of use of DME;Body mechanics;Dexterity;Sensation;GMC;Strength;Pain;Coordination;IADL;Cardiopulmonary status limiting activity;Vision    Rehab Potential Good    Clinical Decision Making Several treatment options, min-mod task modification necessary    Comorbidities Affecting Occupational Performance: Presence of comorbidities impacting occupational performance    Modification or Assistance to Complete Evaluation  Min-Moderate modification of tasks or assist with assess necessary to complete eval    OT Frequency 2x / week    OT Duration 8 weeks    OT Treatment/Interventions Self-care/ADL training;Electrical Stimulation;Therapeutic exercise;Visual/perceptual remediation/compensation;Moist Heat;Neuromuscular education;Patient/family education;Energy conservation;Fluidtherapy;Therapeutic activities;Cryotherapy;Paraffin;DME and/or AE instruction;Contrast Bath;Manual Therapy;Passive range of motion;Cognitive remediation/compensation    Plan functional dressing tasks; introduce compensatory strategies    Consulted and Agree with Plan of Care Patient;Family member/caregiver    Family Member Consulted Wife           Patient will benefit from skilled therapeutic intervention in order to improve the following deficits and impairments:   Body Structure / Function / Physical Skills: ADL,ROM,UE functional use,FMC,Decreased knowledge of use of DME,Body mechanics,Dexterity,Sensation,GMC,Strength,Pain,Coordination,IADL,Cardiopulmonary status limiting activity,Vision       Visit Diagnosis: Hemiplegia and hemiparesis following cerebral  infarction affecting left non-dominant side (HCC)  Other lack of coordination  Muscle weakness (generalized)  Paresthesia of skin  Other symptoms and signs involving the nervous system    Problem List Patient Active Problem List   Diagnosis Date Noted  . CVA (cerebral vascular accident) (HCC) 10/21/2020  . Bilateral primary osteoarthritis of knee 02/15/2020  . Acquired trigger finger of right little finger 11/03/2019  . Encounter for orthopedic follow-up care 10/13/2019  . H/O total hip arthroplasty, bilateral 10/24/2018  . Gout 08/18/2017  . Overweight (BMI  25.0-29.9) 01/23/2017  . Foot pain, left 02/11/2016  . Sebaceous cyst 07/14/2014  . Sun-damaged skin 07/14/2014  . 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/14/2014  . Physical exam, annual 05/17/2012  . Inguinal hernia 08/12/2010  . 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 TOBACCO USER 05/01/2010  . Paroxysmal a-fib (HCC) 05/01/2010  . ABNORMAL ELECTROCARDIOGRAM 03/26/2010     Rosie Fate, OTR/L, MSOT 11/19/2020, 3:16 PM  [facility] [address] [place], [place], [address] Phone: [phone]   Fax:  [phone]  Name: [name] Short MRN: [id number] Date of Birth: 01-Jan-1946

## 2020-11-20 ENCOUNTER — Other Ambulatory Visit: Payer: Self-pay | Admitting: Cardiology

## 2020-11-20 MED ORDER — APIXABAN 5 MG PO TABS: 5.0000 mg | ORAL_TABLET | Freq: Two times a day (BID) | ORAL | 0 refills | Status: DC

## 2020-11-20 MED ORDER — ROSUVASTATIN CALCIUM 20 MG PO TABS: 20.0000 mg | ORAL_TABLET | Freq: Every day | ORAL | 0 refills | Status: DC

## 2020-11-20 MED ORDER — DILTIAZEM HCL ER COATED BEADS 120 MG PO CP24: 120.0000 mg | ORAL_CAPSULE | Freq: Every day | ORAL | 0 refills | Status: DC

## 2020-11-20 NOTE — Telephone Encounter (Signed)
Spoke to patient's wife she stated husband will need refills on Elquis,Diltiazem,Crestor.He only wants 30 day supply on Eliquis but 90 day supply on others.Refill sent to pharmacy.Advised to keep appointment already scheduled with Dr.Schumann 3/1at 2:00 pm.

## 2020-11-20 NOTE — Telephone Encounter (Signed)
*  STAT* If patient is at the pharmacy, call can be transferred to refill team.   1. Which medications need to be refilled? (please list name of each medication and dose if known) apixaban (ELIQUIS) 5 MG TABS tablet; diltiazem (CARDIZEM CD) 120 MG 24 hr capsule; rosuvastatin (CRESTOR) 20 MG tablet  2. Which pharmacy/location (including street and city if local pharmacy) is medication to be sent to?  Karin Golden at [facility] [address] [place] - [address]  3. Do they need a 30 day or 90 day supply? 90

## 2020-11-21 NOTE — Addendum Note (Signed)
Addended by: Eather Colas on: 11/21/2020 03:05 PM   Modules accepted: Orders

## 2020-11-21 NOTE — Telephone Encounter (Signed)
79M, CANNOT SEE WEIGHT, SCR 1.21 10/23/20, LOVW/NO ONE HAS UPCOMING APPT W/SCHUMAN ON 3/1. I WILL ROUTE TO PHARMD POOL AS I AM UNSURE IF I CAN REFILL OF NOT BEING NOT SEEN YET

## 2020-11-21 NOTE — Telephone Encounter (Signed)
Note  *STAT* If patient is at the pharmacy, call can be transferred to refill team.   1. Which medications need to be refilled? (please list name of each medication and dose if known) apixaban (ELIQUIS) 5 MG TABS tablet  2. Which pharmacy/location (including street and city if local pharmacy) is medication to be sent to?  Karin Golden at [facility] [address] [place] - [address]  3. Do they need a 30 day or 90 day supply? 90

## 2020-11-22 MED ORDER — APIXABAN 5 MG PO TABS: 5.0000 mg | ORAL_TABLET | Freq: Two times a day (BID) | ORAL | 1 refills | Status: DC

## 2020-11-22 NOTE — Addendum Note (Signed)
Addended by: Marshal Schrecengost E on: 11/22/2020 02:30 PM   Modules accepted: Orders

## 2020-11-22 NOTE — Telephone Encounter (Signed)
° ° ° °  Pt's wife is calling to follow up refill for eliquis, she said pt is completely out of medications and worried that pt will have another heart attack if he doesn't have pills soon, we refilled his eliquis on 02/22 but it's only for 30 days, it will cost them $515 if it's 30 days but if they can get for 90 days it will cost them $800, she said they want the 90 days because they will saved $200, per pt, Elnita Maxwell did offer 90 days and wasn't sure why only 30 days refill was sent. She said if she can get the refill today.

## 2020-11-22 NOTE — Telephone Encounter (Addendum)
Weight was 81kg last month, pt has upcoming appt scheduled, has hx of stroke and pt is out of med today, refill sent in.

## 2020-11-26 ENCOUNTER — Ambulatory Visit (INDEPENDENT_AMBULATORY_CARE_PROVIDER_SITE_OTHER): Payer: Medicare Other | Admitting: Adult Health

## 2020-11-26 ENCOUNTER — Other Ambulatory Visit: Payer: Self-pay

## 2020-11-26 ENCOUNTER — Encounter: Payer: Self-pay | Admitting: Adult Health

## 2020-11-26 VITALS — BP 123/77 | HR 76 | Ht 67.0 in | Wt 170.0 lb

## 2020-11-26 DIAGNOSIS — I639 Cerebral infarction, unspecified: Secondary | ICD-10-CM

## 2020-11-26 NOTE — Patient Instructions (Signed)
Continue working with outpatient therapies for hopeful ongoing recovery  Graduated return to driving as recommended.  It is recommended that you first drive with another licensed driver in an empty parking lot. If you do well with this, you can drive on a quiet street with the licensed driver.  If you do well with this, you can drive on a busy street with a licensed driver.  If you continue to do well, you can be cleared to drive independently.  For the first month after resuming driving, it is recommend no nighttime, busy/heavy traffic roads or Interstate driving.   Continue Eliquis (apixaban) daily  and Crestor 20 mg daily for secondary stroke prevention  Follow-up tomorrow with cardiology as scheduled  Please ensure you schedule follow-up with your PCP as this is recommended after any hospitalizations  Continue to follow up with PCP regarding cholesterol and blood pressure management  Maintain strict control of hypertension with blood pressure goal below 130/90 and cholesterol with LDL cholesterol (bad cholesterol) goal below 70 mg/dL.      Followup in the future with me in 3 months or call earlier if needed       Thank you for coming to see [place] at [facility]. I hope we have been able to provide you high quality care today.  You may receive a patient satisfaction survey over the next few weeks. We would appreciate your feedback and comments so that we may continue to improve ourselves and the health of our patients.

## 2020-11-26 NOTE — Progress Notes (Signed)
[facility] [address] [place]. [place] [address] [phone]       HOSPITAL FOLLOW UP NOTE  Mr. BIRUK TROIA Date of Birth:  Mar 14, 1946 Medical Record Number:  [id number]   Reason for Referral:  hospital stroke follow up    SUBJECTIVE:   CHIEF COMPLAINT:  Chief Complaint  Patient presents with  . Follow-up    TR with wife (dawn) PT is well, speech is slow but getting better, getting hand movement back slowly, some L side of face drooping.     HPI:   Paton Humphreyis a 75 y.o.malewith medical history significant ofatrial fibrillation (not on AC), HLD, gout, OA, BPH, GERD. He had bilateral inquinal hernia repair 10/16/20. EKG 10/16/20 revealed A. Fib. On 10/21/2020, he presented with slurred speech and left  hand weakness that started on awakening from a nap.  Personally reviewed hospitalization pertinent progress notes, lab work and imaging with summary provided. Evaluated by Dr. 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 Initially diagnosed with PAF 2011 during stress test asymptomatic therefore placed on aspirin. Recommended initiating Eliquis 5 mg twice daily in setting of recent embolic stroke. LDL 98 and initiated Crestor 20 mg daily. A1c 5.6. Other stroke risk factors include advanced age and cigar smoker x15 yrs. Residual deficits of mild dysarthria, left facial weakness, and LUE weakness. Evaluated by therapies who recommended discharge home with outpatient PT/OT/SLP.  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   CTA neck No acute vascular finding within the neck.  CTA head High-grade narrowing of right M3 branch with non opacification Distally.  CT Perfusion Acute right frontal infarct, core of 6 mL.  MRI Moderate acute infarct in the posterior  right frontal lobe.   2D Echo EF 60 to 65%. In afib at time of study  On aspirin 81 mg PTA. Now on Eliquis  5mg  BID  in setting of recent embolic stroke  LDL 98  HgbA1c 5.6  Therapy recommendations: Outpatient PT/OT/SLP  Disposition:  home  Today, 11/26/2020, Mr. Bookwalter is being seen for hospital follow-up accompanied by his wife.  Reports residual dysarthria (slight), left sided facial weakness, and LUE/hand weakness Currently working with 11/28/2020 PT/OT/SLP slowly progressing towards goals Initial improvement of ambulation since discharge and able to ambulate without AD but last week dx'd with right foot gout which impacted his ambulation and is currently using RW - gout pain has been slowly improving He does question potential return to driving Denies new or worsening stroke/TIA symptoms  Compliance on Eliquis 5 mg twice daily without bleeding or bruising Compliant on Crestor 20 mg daily without myalgias Blood pressure today 123/77 - does not routinely monitor at home as typically stable  Follow-up visit scheduled with cardiology tomorrow No follow-up with PCP since discharge  No further concerns at this time    ROS:   14 system review of systems performed and negative with exception of those listed in HPI  PMH:  Past Medical History:  Diagnosis Date  . Arthritis    WRIST  . Benign localized prostatic hyperplasia with lower urinary tract symptoms (LUTS)   . Complication of anesthesia    slow to wake  . ED (erectile dysfunction)   . GERD (gastroesophageal reflux disease)   . History of concussion    AS CHILD--  NO RESIDUAL  .  History of gout   . History of kidney stones   . Hyperlipidemia   . Inguinal hernia, bilateral   . Nephrolithiasis    BILATERAL  . PAF (paroxysmal atrial fibrillation) (HCC) currently being followed by pcp   EPISODE --  2011  FOLLOW-UP W/ DR WALL  /  HAS NOT SEEN ANY CARDIOLOGIST SINCE    PSH:  Past Surgical History:  Procedure  Laterality Date  . APPENDECTOMY  1983  . CARDIOVASCULAR STRESS TEST  05-08-2010  DR WALL   NO EVIDENCE OF SCAR OR ISCHEMIA/ EF 54%/  PT HAD BOTH AFIB/ AFLUTTER DURING STUDY  . CARPAL TUNNEL RELEASE Right 01/ 14/ 2021  . CYSTOSCOPY W/ URETERAL STENT PLACEMENT Left 09/20/2013   Procedure: CYSTOSCOPY WITH STENT REPLACEMENT;  Surgeon: Antony Haste, MD;  Location: [facility];  Service: Urology;  Laterality: Left;  . CYSTOSCOPY WITH URETEROSCOPY Left 09/20/2013   Procedure: CYSTOSCOPY WITH URETEROSCOPY;  Surgeon: Antony Haste, MD;  Location: [facility];  Service: Urology;  Laterality: Left;  . CYSTOSCOPY WITH URETEROSCOPY AND STENT PLACEMENT Left 07/12/2013   Procedure: CYSTOSCOPY WITH LITHOLAPEXY, LEFT URETERAL STENT PLACEMENT;  Surgeon: Antony Haste, MD;  [facility];  Service: Urology;  Laterality: Left;  . CYSTOSCOPY WITH URETEROSCOPY AND STENT PLACEMENT Bilateral 06/23/2014   Procedure: BILATERAL URETEROSCOPY, HOLMIUM LASER LITHOTRIPSY AND STENT PLACEMENT, RIGHT ;  Surgeon: Jerilee Field, MD;  Location: [facility];  Service: Urology;  Laterality: Bilateral;  . EXTRACORPOREAL SHOCK WAVE LITHOTRIPSY  X2  . EXTRACORPOREAL SHOCK WAVE LITHOTRIPSY Left 08-29-2013;   07-28-2013  . EXTRACORPOREAL SHOCK WAVE LITHOTRIPSY Right 12/09/2018   Procedure: EXTRACORPOREAL SHOCK WAVE LITHOTRIPSY (ESWL);  Surgeon: Jerilee Field, MD;  Location: [facility];  Service: Urology;  Laterality: Right;  . HOLMIUM LASER APPLICATION Left 09/20/2013   Procedure: HOLMIUM LASER APPLICATION;  Surgeon: Antony Haste, MD;  Location[facility];  Service: Urology;  Laterality: Left;  . INGUINAL HERNIA REPAIR Bilateral 10/16/2020   Procedure: BILATERAL OPEN INGUINAL HERNIA REPAIR WITH MESH;  Surgeon: Abigail Miyamoto, MD;  Location: [facility] ;  Service: General;  Laterality: Bilateral;   LMA/TAP BLOCK  . LAPAROSCOPIC INGUINAL HERNIA REPAIR Bilateral 09-04-2010   w/ mesh  . TONSILLECTOMY  AS CHILD  . 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 TRANSTHORACIC ECHOCARDIOGRAM  05-08-2010   NORMAL LVSF/  EF 55%/  GRADE I DIASTOLIC DYSFUNCTION/  MILD BILATERAL ATRIUM ENLARGEMENT    Social History:  Social History   Socioeconomic History  . Marital status: Married    Spouse name: Not on file  . Number of children: Not on file  . Years of education: Not on file  . Highest education level: Not on file  Occupational History  . Occupation: retired  Tobacco Use  . Smoking status: Light Tobacco Smoker    Years: 15.00    Types: Cigars  . Smokeless tobacco: Never Used  . Tobacco comment: AVERAGE 3 CIGARS PER WEEK  Vaping Use  . Vaping Use: Never used  Substance and Sexual Activity  . Alcohol use: Not Currently    Comment: occasional  . Drug use: No  . Sexual activity: Not on file  Other Topics Concern  . Not on file  Social History Narrative  . Not on file   Social Determinants of Health   Financial Resource Strain: Low Risk   . Difficulty of Paying Living Expenses: Not hard at all  Food Insecurity: No Food Insecurity  . Worried About Programme researcher, broadcasting/film/video in the Last Year: Never true  . Ran Out of Food in the Last Year: Never true  Transportation Needs: No Transportation Needs  . Lack of Transportation (Medical): No  . Lack of Transportation (Non-Medical): No  Physical Activity: Sufficiently Active  . Days of Exercise per Week: 6 days  . Minutes of Exercise per Session: 60 min  Stress: No Stress Concern Present  . Feeling of Stress : Not at all  Social Connections: Moderately Isolated  . Frequency of Communication with Friends and Family: More  than three times a week  . Frequency of Social Gatherings with Friends and Family: Once a week  . Attends Religious Services: Never  . Active Member of Clubs or Organizations: No  . Attends Banker Meetings: Never  . Marital Status: Married  Catering manager Violence: Not At Risk  . Fear of Current or Ex-Partner: No  . Emotionally Abused: No  . Physically Abused: No  . Sexually Abused: No    Family History:  Family History  Problem Relation Age of Onset  . Diabetes Mother   . Arthritis Mother   . Rheum arthritis Mother   . Dementia Mother   . Heart disease Father   . Prostate cancer Father   . Diabetes Sister   . Diabetes Maternal Grandmother   . Diabetes Maternal Grandfather   . Bipolar disorder Brother   . Diabetes Brother   . Dementia Brother   . Neuropathy Brother     Medications:   Current Outpatient Medications on File Prior to Visit  Medication Sig Dispense Refill  . acetaminophen (TYLENOL) 500 MG tablet Take 1 tablet (500 mg total) by mouth every 6 (six) hours as needed. 30 tablet 0  . apixaban (ELIQUIS) 5 MG TABS tablet Take 1 tablet (5 mg total) by mouth 2 (two) times daily. 180 tablet 1  . calcium carbonate (TUMS - DOSED IN MG ELEMENTAL CALCIUM) 500 MG chewable tablet Chew 1 tablet by mouth as needed for indigestion or heartburn.    . Cimetidine (TAGAMET PO) Take 1 tablet by mouth daily as needed (for reflux).    . Colchicine 0.6 MG CAPS Take 0.6 mg by mouth daily as needed (for gout flares).    Marland Kitchen diltiazem (CARDIZEM CD) 120 MG 24 hr capsule Take 1 capsule (120 mg total) by mouth daily. 90 capsule 0  . fenofibrate 160 MG tablet TAKE 1 TABLET BY MOUTH  DAILY (Patient taking differently: Take 160 mg by mouth in the morning.) 90 tablet 3  . Multiple Vitamin (MULTIVITAMIN WITH MINERALS) TABS Take 1 tablet by mouth daily.    Marland Kitchen oxyCODONE (OXY IR/ROXICODONE) 5 MG immediate release tablet Take 1 tablet (5 mg total) by mouth every 6 (six) hours as needed for  moderate pain or severe pain. 30 tablet 0  . Polyethylene Glycol 400 (BLINK TEARS) 0.25 % SOLN Place 1 drop into both eyes 2 (two) times daily as needed (for dryness or irritation).    . Red Yeast Rice 600 MG TABS Take 600 mg by mouth daily.    . rosuvastatin (CRESTOR) 20 MG tablet Take 1 tablet (20 mg total) by mouth daily. 90 tablet 0  . tadalafil (CIALIS) 5 MG tablet Take  5 mg by mouth daily as needed for erectile dysfunction.    . tamsulosin (FLOMAX) 0.4 MG CAPS capsule Take 0.4 mg by mouth at bedtime as needed (for urinary issues).     No current facility-administered medications on file prior to visit.    Allergies:   Allergies  Allergen Reactions  . Shellfish-Derived Products Other (See Comments)    Gout   . Warfarin Sodium Rash      OBJECTIVE:  Physical Exam  Vitals:   11/26/20 1406  BP: 123/77  Pulse: 76  Weight: 170 lb (77.1 kg)  Height: 5\' 7"  (1.702 m)   Body mass index is 26.63 kg/m. No exam data present  Post Stroke PHQ 2/9 Depression screen PHQ 2/9 11/26/2020  Decreased Interest 0  Down, Depressed, Hopeless 0  PHQ - 2 Score 0  Altered sleeping -  Tired, decreased energy -  Change in appetite -  Feeling bad or failure about yourself  -  Trouble concentrating -  Moving slowly or fidgety/restless -  Suicidal thoughts -  PHQ-9 Score -  Difficult doing work/chores -  Some recent data might be hidden     General: well developed, well nourished,  very pleasant elderly Caucasian male, seated, in no evident distress Head: head normocephalic and atraumatic.   Neck: supple with no carotid or supraclavicular bruits Cardiovascular: regular rate and rhythm, no murmurs Musculoskeletal: no deformity Skin:  no rash/petichiae Vascular:  Normal pulses all extremities   Neurologic Exam Mental Status: Awake and fully alert.   Very slight dysarthria.  Oriented to place and time. Recent and remote memory intact. Attention span, concentration and fund of knowledge  appropriate. Mood and affect appropriate.  Cranial Nerves: Fundoscopic exam reveals sharp disc margins. Pupils equal, briskly reactive to light. Extraocular movements full without nystagmus. Visual fields full to confrontation. Hearing intact. Facial sensation intact.  Left-sided facial weakness.  Tongue, and palate moves normally and symmetrically.  Motor: Normal bulk and tone. Normal strength in all tested extremity muscles except slightly decreased left hand grip strength and decreased dexterity Sensory.: intact to touch , pinprick , position and vibratory sensation.  Coordination: Rapid alternating movements normal in all extremities except slightly decreased left hand Dexterity. Finger-to-nose mild dysmetria LUE and heel-to-shin performed accurately bilaterally. Gait and Station: Arises from chair without difficulty. Stance is normal. Gait demonstrates normal stride length and balance with use of RW and mild favoring of right foot (recent dx of gout). Tandem walk and heel toe not attempted.  Reflexes: 1+ and symmetric. Toes downgoing.     NIHSS  2 Modified Rankin  2      ASSESSMENT: Dyanne IhaRichard I Davee is a 75 y.o. 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 Vascular risk factors include PAF not on AC, HLD, advanced age, and cigar smoker.      PLAN:  1. R frontal stroke, embolic:  a. Residual deficit: Left hand weakness, mild dysarthria and left facial weakness.  Continue working with therapies for likely ongoing recovery.  Released to return back to driving on a graduated return to driving basis further discussed with patient and wife as his residual stroke deficits are minimal and should not interfere with driving or pose any safety concerns b. Continue Eliquis (apixaban) daily  and Crestor for secondary stroke prevention.   c. Discussed  secondary stroke prevention measures and importance of close PCP  follow up for aggressive stroke risk factor management  2. PAF: On Eliquis 5 mg twice daily for CHA2DS2-VASc score of at least 3. Not previously on [facility] as he remained asymptomatic and low CHA2DS2-VASc score. F/u w/ cardiology scheduled tomorrow 3. HTN: BP goal <130/90.  Well-controlled monitored by PCP 4. HLD: LDL goal <70. Recent LDL 98 therefore Crestor 20 mg daily initiated during stroke admission.  Request follow-up with PCP in the next 1 to 2 months for repeat lipid panel with routine monitoring and ongoing prescribing of Crestor   Follow up in 3 months or call earlier if needed   CC:  [facility] provider: Dr. Pearlean Brownie PCP: Sheliah Hatch, MD    I spent 45 minutes of face-to-face and non-face-to-face time with patient and wife.  This included previsit chart review including recent hospitalization pertinent progress notes, Lab work and imaging, lab review, study review, order entry, electronic health record documentation, patient and wife education and discussion regarding recent stroke including etiology, residual deficits, importance of managing stroke risk factors and answered all other questions to patient and wife's satisfaction  Ihor Austin, AGNP-BC  [facility] [address] [place], [place] [address]  Phone [phone] Fax [phone] Note: This document was prepared with digital dictation and possible smart phrase technology. Any transcriptional errors that result from this process are unintentional.

## 2020-11-27 ENCOUNTER — Ambulatory Visit (INDEPENDENT_AMBULATORY_CARE_PROVIDER_SITE_OTHER): Payer: Medicare Other

## 2020-11-27 ENCOUNTER — Encounter: Payer: Self-pay | Admitting: Cardiology

## 2020-11-27 ENCOUNTER — Encounter: Payer: Self-pay | Admitting: *Deleted

## 2020-11-27 ENCOUNTER — Ambulatory Visit (INDEPENDENT_AMBULATORY_CARE_PROVIDER_SITE_OTHER): Payer: Medicare Other | Admitting: Cardiology

## 2020-11-27 ENCOUNTER — Other Ambulatory Visit: Payer: Self-pay

## 2020-11-27 VITALS — BP 135/70 | HR 65 | Ht 67.0 in | Wt 170.2 lb

## 2020-11-27 DIAGNOSIS — I10 Essential (primary) hypertension: Secondary | ICD-10-CM | POA: Diagnosis not present

## 2020-11-27 DIAGNOSIS — I639 Cerebral infarction, unspecified: Secondary | ICD-10-CM

## 2020-11-27 DIAGNOSIS — I480 Paroxysmal atrial fibrillation: Secondary | ICD-10-CM

## 2020-11-27 DIAGNOSIS — R0681 Apnea, not elsewhere classified: Secondary | ICD-10-CM

## 2020-11-27 DIAGNOSIS — E785 Hyperlipidemia, unspecified: Secondary | ICD-10-CM | POA: Diagnosis not present

## 2020-11-27 DIAGNOSIS — R0683 Snoring: Secondary | ICD-10-CM | POA: Diagnosis not present

## 2020-11-27 NOTE — Progress Notes (Signed)
Patient ID: [name] Short, male   DOB: [birth year], 75 y.o.   MRN: [id number] Patient enrolled for [employer] to ship a 14 day ZIO XT long term holter monitor to his home.

## 2020-11-27 NOTE — Progress Notes (Signed)
I agree with the above plan 

## 2020-11-27 NOTE — Patient Instructions (Signed)
Medication Instructions:  Your physician recommends that you continue on your current medications as directed. Please refer to the Current Medication list given to you today.  *If you need a refill on your cardiac medications before your next appointment, please call your pharmacy*  Testing/Procedures: Your physician has recommended that you have a sleep study. This test records several body functions during sleep, including: brain activity, eye movement, oxygen and carbon dioxide blood levels, heart rate and rhythm, breathing rate and rhythm, the flow of air through your mouth and nose, snoring, body muscle movements, and chest and belly movement. --this must be approved by insurance prior to scheduling.   ZIO XT- Long Term Monitor Instructions   Your physician has requested you wear your ZIO patch monitor 14 days.   This is a single patch monitor.  [employer] supplies one patch monitor per enrollment.  Additional stickers are not available.   Please do not apply patch if you will be having a Nuclear Stress Test, Echocardiogram, Cardiac CT, MRI, or Chest Xray during the time frame you would be wearing the monitor. The patch cannot be worn during these tests.  You cannot remove and re-apply the ZIO XT patch monitor.   Your ZIO patch monitor will be sent USPS Priority mail from [facility] directly to your home address. The monitor may also be mailed to a PO BOX if home delivery is not available.   It may take 3-5 days to receive your monitor after you have been enrolled.   Once you have received you monitor, please review enclosed instructions.  Your monitor has already been registered assigning a specific monitor serial # to you.   Applying the monitor   Shave hair from upper left chest.   Hold abrader disc by orange tab.  Rub abrader in 40 strokes over left upper chest as indicated in your monitor instructions.   Clean area with 4 enclosed alcohol pads .  Use all pads to assure  are is cleaned thoroughly.  Let dry.   Apply patch as indicated in monitor instructions.  Patch will be place under collarbone on left side of chest with arrow pointing upward.   Rub patch adhesive wings for 2 minutes.Remove white label marked "1".  Remove white label marked "2".  Rub patch adhesive wings for 2 additional minutes.   While looking in a mirror, press and release button in center of patch.  A small green light will flash 3-4 times .  This will be your only indicator the monitor has been turned on.     Do not shower for the first 24 hours.  You may shower after the first 24 hours.   Press button if you feel a symptom. You will hear a small click.  Record Date, Time and Symptom in the Patient Log Book.   When you are ready to remove patch, follow instructions on last 2 pages of Patient Log Book.  Stick patch monitor onto last page of Patient Log Book.   Place Patient Log Book in [place] box.  Use locking tab on box and tape box closed securely.  The [employer] and [employer] has [employer] on it.  Please place in mailbox as soon as possible.  Your physician should have your test results approximately 7 days after the monitor has been mailed back to [facility].   Call [facility] Customer Care at [phone] if you have questions regarding your ZIO XT patch monitor.  Call them immediately if you see  an orange light blinking on your monitor.   If your monitor falls off in less than 4 days contact our Monitor department at [phone].  If your monitor becomes loose or falls off after 4 days call [employer] at [phone] for suggestions on securing your monitor.   Follow-Up: At [facility], you and your health needs are our priority.  As part of our continuing mission to provide you with exceptional heart care, we have created designated Provider Care Teams.  These Care Teams include your primary Cardiologist (physician) and Advanced Practice Providers (APPs -  Physician  Assistants and Nurse Practitioners) who all work together to provide you with the care you need, when you need it.  We recommend signing up for the patient portal called "MyChart".  Sign up information is provided on this After Visit Summary.  MyChart is used to connect with patients for Virtual Visits (Telemedicine).  Patients are able to view lab/test results, encounter notes, upcoming appointments, etc.  Non-urgent messages can be sent to your provider as well.   To learn more about what you can do with MyChart, go to [email or web address].    Your next appointment:   3 month(s)  The format for your next appointment:   In Person  Provider:   Epifanio Lesches, MD

## 2020-11-27 NOTE — Progress Notes (Signed)
Cardiology Office Note:    Date:  11/27/2020   ID:  [name], DOB 03/13/1946, MRN [id number]  PCP:  Sheliah Hatch, MD  Cardiologist:  No primary care provider on file.  Electrophysiologist:  None   Referring MD: Zannie Cove, MD   Chief Complaint  Patient presents with  . Atrial Fibrillation    History of Present Illness:    [name] Short is a 75 y.o. male with a hx of atrial fibrillation, CVA, hypertension, hyperlipidemia who presents for hospital follow-up.  He has a history of paroxysmal atrial fibrillation but has not been on anticoagulation.  He presented to the ED with slurred speech and left hand weakness on 10/21/20, was found to have acute CVA.  MRI brain showed acute infarct in the posterior right frontal lobe.  He had residual left-sided weakness and dysarthria.  Echocardiogram showed normal LVEF.  He was started on Eliquis 5 mg twice daily, stroke likely secondary to atrial fibrillation.  He had paroxysmal atrial fibrillation while in the hospital, heart rate 90s to 100s while in A. fib.  He was started on Cardizem.  Since discharge from the hospital, he reports that he has been doing well.  Has been taking Eliquis, denies any bleeding issues.  Denies any chest pain, dyspnea, lightheadedness, syncope, or palpitations.  Has been having swelling in the right foot.  Started on colchicine by his Orthopedist with improvement.  Continues to to have left-sided weakness.  Wife reports that he snores and she has observed apnea events at night.   Past Medical History:  Diagnosis Date  . Arthritis    WRIST  . Benign localized prostatic hyperplasia with lower urinary tract symptoms (LUTS)   . Complication of anesthesia    slow to wake  . ED (erectile dysfunction)   . GERD (gastroesophageal reflux disease)   . History of concussion    AS CHILD--  NO RESIDUAL  . History of gout   . History of kidney stones   . Hyperlipidemia   . Inguinal hernia, bilateral    . Nephrolithiasis    BILATERAL  . PAF (paroxysmal atrial fibrillation) (HCC) currently being followed by pcp   EPISODE --  2011  FOLLOW-UP W/ DR WALL  /  HAS NOT SEEN ANY CARDIOLOGIST SINCE    Past Surgical History:  Procedure Laterality Date  . APPENDECTOMY  1983  . CARDIOVASCULAR STRESS TEST  05-08-2010  DR WALL   NO EVIDENCE OF SCAR OR ISCHEMIA/ EF 54%/  PT HAD BOTH AFIB/ AFLUTTER DURING STUDY  . CARPAL TUNNEL RELEASE Right 01/ 14/ 2021  . CYSTOSCOPY W/ URETERAL STENT PLACEMENT Left 09/20/2013   Procedure: CYSTOSCOPY WITH STENT REPLACEMENT;  Surgeon: Antony Haste, MD;  Location: [facility];  Service: Urology;  Laterality: Left;  . CYSTOSCOPY WITH URETEROSCOPY Left 09/20/2013   Procedure: CYSTOSCOPY WITH URETEROSCOPY;  Surgeon: Antony Haste, MD;  Location: [facility];  Service: Urology;  Laterality: Left;  . CYSTOSCOPY WITH URETEROSCOPY AND STENT PLACEMENT Left 07/12/2013   Procedure: CYSTOSCOPY WITH LITHOLAPEXY, LEFT URETERAL STENT PLACEMENT;  Surgeon: Antony Haste, MD;  Location: [facility];  Service: Urology;  Laterality: Left;  . CYSTOSCOPY WITH URETEROSCOPY AND STENT PLACEMENT Bilateral 06/23/2014   Procedure: BILATERAL URETEROSCOPY, HOLMIUM LASER LITHOTRIPSY AND STENT PLACEMENT, RIGHT ;  Surgeon: Jerilee Field, MD;  Location[facility];  Service: Urology;  Laterality: Bilateral;  . EXTRACORPOREAL SHOCK WAVE LITHOTRIPSY  X2  . EXTRACORPOREAL SHOCK  WAVE LITHOTRIPSY Left 08-29-2013;   07-28-2013  . EXTRACORPOREAL SHOCK WAVE LITHOTRIPSY Right 12/09/2018   Procedure: EXTRACORPOREAL SHOCK WAVE LITHOTRIPSY (ESWL);  Surgeon: Jerilee FieldEskridge, Matthew, MD;  Location: [facility];  Service: Urology;  Laterality: Right;  . HOLMIUM LASER APPLICATION Left 09/20/2013   Procedure: HOLMIUM LASER APPLICATION;  Surgeon: Antony HasteMatthew Ramsey Eskridge, MD;  [facility] [facility];  Service: Urology;   Laterality: Left;  . INGUINAL HERNIA REPAIR Bilateral 10/16/2020   Procedure: BILATERAL OPEN INGUINAL HERNIA REPAIR WITH MESH;  Surgeon: Abigail MiyamotoBlackman, Douglas, MD;  [facility];  Service: General;  Laterality: Bilateral;  LMA/TAP BLOCK  . LAPAROSCOPIC INGUINAL HERNIA REPAIR Bilateral 09-04-2010   w/ mesh  . TONSILLECTOMY  AS CHILD  . 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 TOTAL HIP ARTHROPLASTY Right 11/15/2012   Procedure: RIGHT TOTAL HIP ARTHROPLASTY ANTERIOR APPROACH;  Surgeon: Eldred MangesMark C Yates, MD;  Location: [facility];  Service: Orthopedics;  Laterality: Right;  Right total hip arthroplasty  . TRANSTHORACIC ECHOCARDIOGRAM  05-08-2010   NORMAL LVSF/  EF 55%/  GRADE I DIASTOLIC DYSFUNCTION/  MILD BILATERAL ATRIUM ENLARGEMENT    Current Medications: Current Meds  Medication Sig  . acetaminophen (TYLENOL) 500 MG tablet Take 1 tablet (500 mg total) by mouth every 6 (six) hours as needed.  Marland Kitchen. apixaban (ELIQUIS) 5 MG TABS tablet Take 1 tablet (5 mg total) by mouth 2 (two) times daily.  . calcium carbonate (TUMS - DOSED IN MG ELEMENTAL CALCIUM) 500 MG chewable tablet Chew 1 tablet by mouth as needed for indigestion or heartburn.  . Cimetidine (TAGAMET PO) Take 1 tablet by mouth daily as needed (for reflux).  . Colchicine 0.6 MG CAPS Take 0.6 mg by mouth daily as needed (for gout flares).  Marland Kitchen. diltiazem (CARDIZEM CD) 120 MG 24 hr capsule Take 1 capsule (120 mg total) by mouth daily.  . fenofibrate 160 MG tablet TAKE 1 TABLET BY MOUTH  DAILY (Patient taking differently: Take 160 mg by mouth in the morning.)  . Multiple Vitamin (MULTIVITAMIN WITH MINERALS) TABS Take 1 tablet by mouth daily.  Marland Kitchen. oxyCODONE (OXY IR/ROXICODONE) 5 MG immediate release tablet Take 1 tablet (5 mg total) by mouth every 6 (six) hours as needed for moderate pain or severe pain.  . Polyethylene Glycol 400 (BLINK TEARS) 0.25 % SOLN Place 1 drop into both eyes 2 (two) times daily as needed (for dryness or  irritation).  . Red Yeast Rice 600 MG TABS Take 600 mg by mouth daily.  . rosuvastatin (CRESTOR) 20 MG tablet Take 1 tablet (20 mg total) by mouth daily.  . tadalafil (CIALIS) 5 MG tablet Take 5 mg by mouth daily as needed for erectile dysfunction.  . tamsulosin (FLOMAX) 0.4 MG CAPS capsule Take 0.4 mg by mouth at bedtime as needed (for urinary issues).     Allergies:   Shellfish-derived products and Warfarin sodium   Social History   Socioeconomic History  . Marital status: Married    Spouse name: Not on file  . Number of children: Not on file  . Years of education: Not on file  . Highest education level: Not on file  Occupational History  . Occupation: retired  Tobacco Use  . Smoking status: Light Tobacco Smoker    Years: 15.00    Types: Cigars  . Smokeless tobacco: Never Used  . Tobacco comment: AVERAGE 3 CIGARS PER WEEK  Vaping Use  . Vaping Use: Never used  Substance and Sexual Activity  . Alcohol use:  Not Currently    Comment: occasional  . Drug use: No  . Sexual activity: Not on file  Other Topics Concern  . Not on file  Social History Narrative  . Not on file   Social Determinants of Health   Financial Resource Strain: Low Risk   . Difficulty of Paying Living Expenses: Not hard at all  Food Insecurity: No Food Insecurity  . Worried About Programme researcher, broadcasting/film/video in the Last Year: Never true  . Ran Out of Food in the Last Year: Never true  Transportation Needs: No Transportation Needs  . Lack of Transportation (Medical): No  . Lack of Transportation (Non-Medical): No  Physical Activity: Sufficiently Active  . Days of Exercise per Week: 6 days  . Minutes of Exercise per Session: 60 min  Stress: No Stress Concern Present  . Feeling of Stress : Not at all  Social Connections: Moderately Isolated  . Frequency of Communication with Friends and Family: More than three times a week  . Frequency of Social Gatherings with Friends and Family: Once a week  . Attends  Religious Services: Never  . Active Member of Clubs or Organizations: No  . Attends Banker Meetings: Never  . Marital Status: Married     Family History: The patient's family history includes Arthritis in his mother; Bipolar disorder in his brother; Dementia in his brother and mother; Diabetes in his brother, maternal grandfather, maternal grandmother, mother, and sister; Heart disease in his father; Neuropathy in his brother; Prostate cancer in his father; Rheum arthritis in his mother.  ROS:   Please see the history of present illness.     All other systems reviewed and are negative.  EKGs/Labs/Other Studies Reviewed:    The following studies were reviewed today:   EKG:  EKG is ordered today.  The ekg ordered today demonstrates normal sinus rhythm, rate 65, no ST abnormalities  Recent Labs: 10/21/2020: ALT 18 10/22/2020: TSH 1.489 10/23/2020: BUN 15; Creatinine, Ser 1.21; Hemoglobin 13.5; Platelets 288; Potassium 3.7; Sodium 140  Recent Lipid Panel    Component Value Date/Time   CHOL 158 10/22/2020 0315   TRIG 172 (H) 10/22/2020 0315   HDL 26 (L) 10/22/2020 0315   CHOLHDL 6.1 10/22/2020 0315   VLDL 34 10/22/2020 0315   LDLCALC 98 10/22/2020 0315   LDLDIRECT 128.1 05/17/2012 0901    Physical Exam:    VS:  BP 135/70   Pulse 65   Ht 5\' 7"  (1.702 m)   Wt 170 lb 3.2 oz (77.2 kg)   SpO2 97%   BMI 26.66 kg/m     Wt Readings from Last 3 Encounters:  11/27/20 170 lb 3.2 oz (77.2 kg)  11/26/20 170 lb (77.1 kg)  10/21/20 177 lb 14.6 oz (80.7 kg)     GEN:  in no acute distress HEENT: Normal NECK: No JVD; No carotid bruits CARDIAC: RRR, no murmurs, rubs, gallops RESPIRATORY:  Clear to auscultation without rales, wheezing or rhonchi  ABDOMEN: Soft, non-tender, non-distended MUSCULOSKELETAL:  No edema; No deformity  SKIN: Warm and dry NEUROLOGIC:  Alert and oriented x 3 PSYCHIATRIC:  Normal affect   ASSESSMENT:    1. Paroxysmal atrial fibrillation (HCC)    2. Cerebrovascular accident (CVA), unspecified mechanism (HCC)   3. Snoring   4. Witnessed apneic spells   5. Essential hypertension   6. Hyperlipidemia, unspecified hyperlipidemia type    PLAN:    Paroxysmal atrial fibrillation: CHA2DS2-VASc score 4 (age, hypertension, CVA x2).  Echocardiogram  10/23/2020 showed normal biventricular function, no significant valvular disease. -Continue Eliquis 5 mg bid.  Eliquis is Tier 3 medication on his insurance plan; warfarin is Tier 1 but he has a warfarin allergy (rash).  Will discuss with pharmacy -Continue diltiazem 120 mg daily -Sleep study -Check Zio patch x2 weeks to evaluate A. fib burden  CVA: Presented 10/21/2020 with left-sided weakness, found to have acute CVA.  Likely due to A. fib as above.  Continue Eliquis, statin  Hyperlipidemia: LDL 98 on 10/22/2020, started on rosuvastatin 20 mg daily.  Goal LDL less than 70.    Hypertension: On diltiazem 120 mg daily.  Appears controlled.    Snoring/observed apnea events: Will check sleep study  RTC in 3 months  Medication Adjustments/Labs and Tests Ordered: Current medicines are reviewed at length with the patient today.  Concerns regarding medicines are outlined above.  Orders Placed This Encounter  Procedures  . LONG TERM MONITOR (3-14 DAYS)  . EKG 12-Lead  . Split night study   No orders of the defined types were placed in this encounter.   Patient Instructions  Medication Instructions:  Your physician recommends that you continue on your current medications as directed. Please refer to the Current Medication list given to you today.  *If you need a refill on your cardiac medications before your next appointment, please call your pharmacy*  Testing/Procedures: Your physician has recommended that you have a sleep study. This test records several body functions during sleep, including: brain activity, eye movement, oxygen and carbon dioxide blood levels, heart rate and rhythm,  breathing rate and rhythm, the flow of air through your mouth and nose, snoring, body muscle movements, and chest and belly movement. --this must be approved by insurance prior to scheduling.   ZIO XT- Long Term Monitor Instructions   Your physician has requested you wear your ZIO patch monitor 14 days.   This is a single patch monitor.  [employer] supplies one patch monitor per enrollment.  Additional stickers are not available.   Please do not apply patch if you will be having a Nuclear Stress Test, Echocardiogram, Cardiac CT, MRI, or Chest Xray during the time frame you would be wearing the monitor. The patch cannot be worn during these tests.  You cannot remove and re-apply the ZIO XT patch monitor.   Your ZIO patch monitor will be sent USPS Priority mail from [facility] directly to your home address. The monitor may also be mailed to a PO BOX if home delivery is not available.   It may take 3-5 days to receive your monitor after you have been enrolled.   Once you have received you monitor, please review enclosed instructions.  Your monitor has already been registered assigning a specific monitor serial # to you.   Applying the monitor   Shave hair from upper left chest.   Hold abrader disc by orange tab.  Rub abrader in 40 strokes over left upper chest as indicated in your monitor instructions.   Clean area with 4 enclosed alcohol pads .  Use all pads to assure are is cleaned thoroughly.  Let dry.   Apply patch as indicated in monitor instructions.  Patch will be place under collarbone on left side of chest with arrow pointing upward.   Rub patch adhesive wings for 2 minutes.Remove white label marked "1".  Remove white label marked "2".  Rub patch adhesive wings for 2 additional minutes.   While looking in a mirror, press and release button  in center of patch.  A small green light will flash 3-4 times .  This will be your only indicator the monitor has been turned on.     Do  not shower for the first 24 hours.  You may shower after the first 24 hours.   Press button if you feel a symptom. You will hear a small click.  Record Date, Time and Symptom in the Patient Log Book.   When you are ready to remove patch, follow instructions on last 2 pages of Patient Log Book.  Stick patch monitor onto last page of Patient Log Book.   Place Patient Log Book in [place] box.  Use locking tab on box and tape box closed securely.  The [employer] and [employer] has [employer] on it.  Please place in mailbox as soon as possible.  Your physician should have your test results approximately 7 days after the monitor has been mailed back to [facility].   Call [facility] Customer Care at [phone] if you have questions regarding your ZIO XT patch monitor.  Call them immediately if you see an orange light blinking on your monitor.   If your monitor falls off in less than 4 days contact our Monitor department at [phone].  If your monitor becomes loose or falls off after 4 days call [employer] at [phone] for suggestions on securing your monitor.   Follow-Up: At [facility], you and your health needs are our priority.  As part of our continuing mission to provide you with exceptional heart care, we have created designated Provider Care Teams.  These Care Teams include your primary Cardiologist (physician) and Advanced Practice Providers (APPs -  Physician Assistants and Nurse Practitioners) who all work together to provide you with the care you need, when you need it.  We recommend signing up for the patient portal called "MyChart".  Sign up information is provided on this After Visit Summary.  MyChart is used to connect with patients for Virtual Visits (Telemedicine).  Patients are able to view lab/test results, encounter notes, upcoming appointments, etc.  Non-urgent messages can be sent to your provider as well.   To learn more about what you can do with MyChart, go to  [email or web address].    Your next appointment:   3 month(s)  The format for your next appointment:   In Person  Provider:   Epifanio Lesches, MD        Signed, Little Ishikawa, MD  11/27/2020 6:07 PM    [facility]

## 2020-11-28 ENCOUNTER — Telehealth: Payer: Self-pay | Admitting: *Deleted

## 2020-11-28 ENCOUNTER — Encounter: Payer: Self-pay | Admitting: Speech Pathology

## 2020-11-28 ENCOUNTER — Encounter: Payer: Self-pay | Admitting: Occupational Therapy

## 2020-11-28 ENCOUNTER — Ambulatory Visit: Payer: Medicare Other | Admitting: Speech Pathology

## 2020-11-28 ENCOUNTER — Ambulatory Visit: Payer: Medicare Other

## 2020-11-28 ENCOUNTER — Ambulatory Visit: Payer: Medicare Other | Attending: Internal Medicine | Admitting: Occupational Therapy

## 2020-11-28 ENCOUNTER — Other Ambulatory Visit: Payer: Self-pay

## 2020-11-28 DIAGNOSIS — R471 Dysarthria and anarthria: Secondary | ICD-10-CM | POA: Diagnosis not present

## 2020-11-28 DIAGNOSIS — R202 Paresthesia of skin: Secondary | ICD-10-CM

## 2020-11-28 DIAGNOSIS — R2681 Unsteadiness on feet: Secondary | ICD-10-CM | POA: Diagnosis not present

## 2020-11-28 DIAGNOSIS — R29818 Other symptoms and signs involving the nervous system: Secondary | ICD-10-CM

## 2020-11-28 DIAGNOSIS — I69354 Hemiplegia and hemiparesis following cerebral infarction affecting left non-dominant side: Secondary | ICD-10-CM | POA: Diagnosis not present

## 2020-11-28 DIAGNOSIS — M6281 Muscle weakness (generalized): Secondary | ICD-10-CM

## 2020-11-28 DIAGNOSIS — R278 Other lack of coordination: Secondary | ICD-10-CM

## 2020-11-28 NOTE — Therapy (Signed)
[facility] [address]. [place], [place], [address] Phone: [phone]   Fax:  [phone]  Occupational Therapy Treatment  Patient Details  Name: [name] Short MRN: [id number] Date of Birth: Jul 03, 1946 Referring Provider (OT): Zannie Cove, MD   Encounter Date: 11/28/2020   OT End of Session - 11/28/20 1648    Visit Number 5    Number of Visits 17    Date for OT Re-Evaluation 12/28/20    Authorization Type Medicare Part A and B    Progress Note Due on Visit 10    OT Start Time 1316    OT Stop Time 1359    OT Time Calculation (min) 43 min    Equipment Utilized During Treatment 9-Hole Peg Test, Box and Blocks Test, goniometer, grip and pinch dynamometer    Activity Tolerance Patient tolerated treatment well    Behavior During Therapy [facility] for tasks assessed/performed           Past Medical History:  Diagnosis Date  . Arthritis    WRIST  . Benign localized prostatic hyperplasia with lower urinary tract symptoms (LUTS)   . Complication of anesthesia    slow to wake  . ED (erectile dysfunction)   . GERD (gastroesophageal reflux disease)   . History of concussion    AS CHILD--  NO RESIDUAL  . History of gout   . History of kidney stones   . Hyperlipidemia   . Inguinal hernia, bilateral   . Nephrolithiasis    BILATERAL  . PAF (paroxysmal atrial fibrillation) (HCC) currently being followed by pcp   EPISODE --  2011  FOLLOW-UP W/ DR WALL  /  HAS NOT SEEN ANY CARDIOLOGIST SINCE    Past Surgical History:  Procedure Laterality Date  . APPENDECTOMY  1983  . CARDIOVASCULAR STRESS TEST  05-08-2010  DR WALL   NO EVIDENCE OF SCAR OR ISCHEMIA/ EF 54%/  PT HAD BOTH AFIB/ AFLUTTER DURING STUDY  . CARPAL TUNNEL RELEASE Right 01/ 14/ 2021  . CYSTOSCOPY W/ URETERAL STENT PLACEMENT Left 09/20/2013   Procedure: CYSTOSCOPY WITH STENT REPLACEMENT;  Surgeon: Antony Haste, MD;  Location: [facility];   Service: Urology;  Laterality: Left;  . CYSTOSCOPY WITH URETEROSCOPY Left 09/20/2013   Procedure: CYSTOSCOPY WITH URETEROSCOPY;  Surgeon: Antony Haste, MD;  Location: [facility];  Service: Urology;  Laterality: Left;  . CYSTOSCOPY WITH URETEROSCOPY AND STENT PLACEMENT Left 07/12/2013   Procedure: CYSTOSCOPY WITH LITHOLAPEXY, LEFT URETERAL STENT PLACEMENT;  Surgeon: Antony Haste, MD;  [facility];  Service: Urology;  Laterality: Left;  . CYSTOSCOPY WITH URETEROSCOPY AND STENT PLACEMENT Bilateral 06/23/2014   Procedure: BILATERAL URETEROSCOPY, HOLMIUM LASER LITHOTRIPSY AND STENT PLACEMENT, RIGHT ;  Surgeon: Jerilee Field, MD;  Location[facility];  Service: Urology;  Laterality: Bilateral;  . EXTRACORPOREAL SHOCK WAVE LITHOTRIPSY  X2  . EXTRACORPOREAL SHOCK WAVE LITHOTRIPSY Left 08-29-2013;   07-28-2013  . EXTRACORPOREAL SHOCK WAVE LITHOTRIPSY Right 12/09/2018   Procedure: EXTRACORPOREAL SHOCK WAVE LITHOTRIPSY (ESWL);  Surgeon: Jerilee Field, MD;  Location: [facility];  Service: Urology;  Laterality: Right;  . HOLMIUM LASER APPLICATION Left 09/20/2013   Procedure: HOLMIUM LASER APPLICATION;  Surgeon: Antony Haste, MD;  Location: [facility];  Service: Urology;  Laterality: Left;  . INGUINAL HERNIA REPAIR Bilateral 10/16/2020   Procedure: BILATERAL OPEN INGUINAL HERNIA REPAIR WITH MESH;  Surgeon: Abigail Miyamoto, MD;  Location: [facility];  Service: General;  Laterality: Bilateral;  LMA/TAP BLOCK  . LAPAROSCOPIC INGUINAL HERNIA REPAIR Bilateral 09-04-2010   w/ mesh  . TONSILLECTOMY  AS CHILD  . 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 TOTAL HIP ARTHROPLASTY Right 11/15/2012   Procedure: RIGHT TOTAL HIP ARTHROPLASTY ANTERIOR APPROACH;  Surgeon: Eldred MangesMark C Yates, MD;  Location: [facility];  Service: Orthopedics;  Laterality: Right;  Right total hip arthroplasty  . TRANSTHORACIC  ECHOCARDIOGRAM  05-08-2010   NORMAL LVSF/  EF 55%/  GRADE I DIASTOLIC DYSFUNCTION/  MILD BILATERAL ATRIUM ENLARGEMENT    There were no vitals filed for this visit.   Subjective Assessment - 11/28/20 1320    Subjective  Pt ambulated into session w/ RW today, reporting he is getting over an episode of gout. Pt also stated he had an appt w/ the neurologist on Monday and with the cardiologist yesterday.    Pertinent History Carpal tunnel release of R hand (January 2021); PMHx of a-fib, gout, OA, and HLD    Patient Stated Goals Improve functional use of L hand; return to activities pt was participating in prior to onset    Currently in Pain? Yes    Pain Score 1     Pain Location Ankle    Pain Orientation Right    Pain Descriptors / Indicators Aching    Pain Onset In the past 7 days            OT Treatments/Exercises (OP) - 11/28/20 1705      ADLs   ADL Education Given Yes    Button Hook Buttoning and unbuttoning open-front shirt; pt able to unbutton small shirt buttons w/ Mod I (extra time) in 5/5 trials. OT introduced/demonstrated use of button hook; pt was able to return demonstration and successfully fasten 5/5 buttons using AE w/ Mod I      Wrist Exercises   Other wrist exercises Bilateral AROM of wrist flexion/extension completed 10x; pt reported some discomfort in R wrist due to previous carpal tunnel release      Hand Exercises   Other Hand Exercises Copying pattern on large pegboard w/ easy-grip pegs using L hand to facilitate FMC/dexterity and hand strength; OT instructed pt to push pegs into the board using the pad of his fingers/thumb opposed to flexing index finger DIP/pushing w/ his knuckle             OT Short Term Goals - 11/28/20 1651      OT SHORT TERM GOAL #1   Title Pt will independently identify and demonstrate understanding of at least 3 compensatory/adaptive strategies, including AE, to be incorporated during BADLs    Baseline No currect  compensatory/adaptive strategies    Time 4    Period Weeks    Status On-going    Target Date 11/30/20      OT SHORT TERM GOAL #2   Title Pt will improve functional FMC for manipulating clothing fasteners as evidenced by placing at least 50% of easy-grip pegs into pegboard independently    Baseline Unable to complete 9-Hole Peg Test due to decreased FMC/dexterity    Time 4    Period Weeks    Status Achieved   11/28/20 - able to place 100% of easy-grip pegs into pegboard independently     OT SHORT TERM GOAL #3   Title Pt will be able to don his socks and shoes, using AE prn, at least 75% of the time    Baseline Requires assist from wife w/ socks  and shoes    Time 4    Period Weeks    Status New    Target Date 12/28/20             OT Long Term Goals - 11/06/20 2119      OT LONG TERM GOAL #1   Title Pt will be independent with HEP designed for strengthening and coordination of LUE and report carryover at home    Baseline No current HEP    Time 8    Period Weeks    Status On-going      OT LONG TERM GOAL #2   Title Pt will complete UB/LB dressing with Mod I    Baseline SPV/Safety; unable to manipulate fasteners    Time 8    Period Weeks    Status On-going      OT LONG TERM GOAL #3   Title Pt will complete symmetrical/asymmetrical bilateral coordination activities with Mod I at least 75% of the time to improve participation in grooming tasks    Baseline Pt reports difficulty using left hand to stabilize during tasks required both hands    Time 8    Period Weeks    Status On-going      OT LONG TERM GOAL #4   Title Pt will improve functional LUE use to facilitate return to leisure activities as evidenced by increasing Box and Block Test by at least 10 blocks with L hand    Baseline Box and Block Test: R hand (46 blocks); L hand (29 blocks)    Time 8    Period Weeks    Status On-going             Plan - 11/28/20 1648    Clinical Impression Statement Pt continues to  exhibit mild deficits in R hand related to previous carpal tunnel/subsequent release that, particularly in combination w/ current L-sided weakness, continues to impact functional FM tasks. Due to functional FM deficits, OT trialed button hook this session. Pt demonstrated good understanding of AE and OT provided a handout to pt for him to order the device for use at home    OT Occupational Profile and History Detailed Assessment- Review of Records and additional review of physical, cognitive, psychosocial history related to current functional performance    Occupational performance deficits (Please refer to evaluation for details): ADL's;IADL's;Leisure    Body Structure / Function / Physical Skills ADL;ROM;UE functional use;FMC;Decreased knowledge of use of DME;Body mechanics;Dexterity;Sensation;GMC;Strength;Pain;Coordination;IADL;Cardiopulmonary status limiting activity;Vision    Rehab Potential Good    Clinical Decision Making Several treatment options, min-mod task modification necessary    Comorbidities Affecting Occupational Performance: Presence of comorbidities impacting occupational performance    Modification or Assistance to Complete Evaluation  Min-Moderate modification of tasks or assist with assess necessary to complete eval    OT Frequency 2x / week    OT Duration 8 weeks    OT Treatment/Interventions Self-care/ADL training;Electrical Stimulation;Therapeutic exercise;Visual/perceptual remediation/compensation;Moist Heat;Neuromuscular education;Patient/family education;Energy conservation;Fluidtherapy;Therapeutic activities;Cryotherapy;Paraffin;DME and/or AE instruction;Contrast Bath;Manual Therapy;Passive range of motion;Cognitive remediation/compensation    Plan Practice functional dressing; bilateral coordination    Consulted and Agree with Plan of Care Patient;Family member/caregiver    Family Member Consulted Wife           Patient will benefit from skilled therapeutic  intervention in order to improve the following deficits and impairments:   Body Structure / Function / Physical Skills: ADL,ROM,UE functional use,FMC,Decreased knowledge of use of DME,Body mechanics,Dexterity,Sensation,GMC,Strength,Pain,Coordination,IADL,Cardiopulmonary status limiting activity,Vision  Visit Diagnosis: Hemiplegia and hemiparesis following cerebral infarction affecting left non-dominant side (HCC)  Other lack of coordination  Muscle weakness (generalized)  Paresthesia of skin  Other symptoms and signs involving the nervous system    Problem List Patient Active Problem List   Diagnosis Date Noted  . CVA (cerebral vascular accident) (HCC) 10/21/2020  . Bilateral primary osteoarthritis of knee 02/15/2020  . Acquired trigger finger of right little finger 11/03/2019  . Encounter for orthopedic follow-up care 10/13/2019  . H/O total hip arthroplasty, bilateral 10/24/2018  . Gout 08/18/2017  . Overweight (BMI 25.0-29.9) 01/23/2017  . Foot pain, left 02/11/2016  . Sebaceous cyst 07/14/2014  . Sun-damaged skin 07/14/2014  . 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/14/2014  . Physical exam, annual 05/17/2012  . Inguinal hernia 08/12/2010  . 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 TOBACCO USER 05/01/2010  . Paroxysmal a-fib (HCC) 05/01/2010  . ABNORMAL ELECTROCARDIOGRAM 03/26/2010     Rosie Fate, OTR/L, MSOT 11/28/2020, 5:15 PM  [facility] [address] [place], [place], [address] Phone: [phone]   Fax:  [phone]  Name: [name] Short MRN: [id number] Date of Birth: 08/16/1946

## 2020-11-28 NOTE — Therapy (Signed)
[place] [place], [place], [address] Phone: [phone]   Fax:  [phone]  Speech Language Pathology Treatment&Discharge  Patient Details  Name: [name] Short MRN: [id number] Date of Birth: [birth year] Referring Provider (SLP): Domenic Polite, MD   Encounter Date: 11/28/2020   End of Session - 11/28/20 1417    Visit Number 5    Number of Visits 17    SLP Start Time 7564    SLP Stop Time  1430    SLP Time Calculation (min) 45 min    Activity Tolerance Patient tolerated treatment well           Past Medical History:  Diagnosis Date  . Arthritis    WRIST  . Benign localized prostatic hyperplasia with lower urinary tract symptoms (LUTS)   . Complication of anesthesia    slow to wake  . ED (erectile dysfunction)   . GERD (gastroesophageal reflux disease)   . History of concussion    AS CHILD--  NO RESIDUAL  . History of gout   . History of kidney stones   . Hyperlipidemia   . Inguinal hernia, bilateral   . Nephrolithiasis    BILATERAL  . PAF (paroxysmal atrial fibrillation) ([place]) currently being followed by pcp   EPISODE --  2011  FOLLOW-UP W/ DR WALL  /  HAS NOT SEEN ANY CARDIOLOGIST SINCE    Past Surgical History:  Procedure Laterality Date  . APPENDECTOMY  1983  . CARDIOVASCULAR STRESS TEST  05-08-2010  DR WALL   NO EVIDENCE OF SCAR OR ISCHEMIA/ EF 54%/  PT HAD BOTH AFIB/ AFLUTTER DURING STUDY  . CARPAL TUNNEL RELEASE Right 01/ 14/ 2021  . CYSTOSCOPY W/ URETERAL STENT PLACEMENT Left 09/20/2013   Procedure: CYSTOSCOPY WITH STENT REPLACEMENT;  Surgeon: Fredricka Bonine, MD;  Location: [facility];  Service: Urology;  Laterality: Left;  . CYSTOSCOPY WITH URETEROSCOPY Left 09/20/2013   Procedure: CYSTOSCOPY WITH URETEROSCOPY;  Surgeon: Fredricka Bonine, MD;  Location: [facility];  Service: Urology;  Laterality: Left;  . CYSTOSCOPY WITH URETEROSCOPY  AND STENT PLACEMENT Left 07/12/2013   Procedure: CYSTOSCOPY WITH LITHOLAPEXY, LEFT URETERAL STENT PLACEMENT;  Surgeon: Fredricka Bonine, MD;  Location: [facility];  Service: Urology;  Laterality: Left;  . CYSTOSCOPY WITH URETEROSCOPY AND STENT PLACEMENT Bilateral 06/23/2014   Procedure: BILATERAL URETEROSCOPY, HOLMIUM LASER LITHOTRIPSY AND STENT PLACEMENT, RIGHT ;  Surgeon: Festus Aloe, MD;  Location: [facility];  Service: Urology;  Laterality: Bilateral;  . EXTRACORPOREAL SHOCK WAVE LITHOTRIPSY  X2  . EXTRACORPOREAL SHOCK WAVE LITHOTRIPSY Left 08-29-2013;   07-28-2013  . EXTRACORPOREAL SHOCK WAVE LITHOTRIPSY Right 12/09/2018   Procedure: EXTRACORPOREAL SHOCK WAVE LITHOTRIPSY (ESWL);  Surgeon: Festus Aloe, MD;  Location: [facility];  Service: Urology;  Laterality: Right;  . HOLMIUM LASER APPLICATION Left 33/29/5188   Procedure: HOLMIUM LASER APPLICATION;  Surgeon: Fredricka Bonine, MD;  Location: [facility];  Service: Urology;  Laterality: Left;  . INGUINAL HERNIA REPAIR Bilateral 10/16/2020   Procedure: BILATERAL OPEN INGUINAL HERNIA REPAIR WITH MESH;  Surgeon: Coralie Keens, MD;  Location: [place];  Service: General;  Laterality: Bilateral;  LMA/TAP BLOCK  . LAPAROSCOPIC INGUINAL HERNIA REPAIR Bilateral 09-04-2010   w/ mesh  . TONSILLECTOMY  AS CHILD  . 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 TOTAL HIP ARTHROPLASTY Right 11/15/2012   Procedure: RIGHT TOTAL HIP ARTHROPLASTY ANTERIOR APPROACH;  Surgeon: Thana Farr  Lorin Mercy, MD;  Location: [place];  Service: Orthopedics;  Laterality: Right;  Right total hip arthroplasty  . TRANSTHORACIC ECHOCARDIOGRAM  05-08-2010   NORMAL LVSF/  EF 14%/  GRADE I DIASTOLIC DYSFUNCTION/  MILD BILATERAL ATRIUM ENLARGEMENT    There were no vitals filed for this visit.   Subjective Assessment - 11/28/20 1403    Subjective "I have had a bout of gout."    Currently in Pain? Yes     Pain Score 1     Pain Location Ankle    Pain Orientation Right    Pain Descriptors / Indicators Aching    Pain Onset In the past 7 days                     SLP Short Term Goals - 11/28/20 1411      SLP SHORT TERM GOAL #1   Title The patient will use 2 of the following (over articulation/slow rate/writing key word/elongation of the vowel/increased loudness/phrasing) strategies to improve speech intelligibility in sentences given a single verbal cue.    Baseline Pt does not use strategies.    Time 1    Period Weeks    Status Achieved      SLP SHORT TERM GOAL #2   Title The patient will improve respiratory support for the production of sentences given a single verbal or visual cue.    Baseline Poor respiratory support; quietness of speech    Time 1    Period Weeks    Status Achieved      SLP SHORT TERM GOAL #3   Title Further assessment of swallowing warranted    Baseline Unable to assess this session.    Time 0    Period Weeks    Status Achieved            SLP Long Term Goals - 11/28/20 1412      SLP LONG TERM GOAL #1   Title Patient will develop functional and intelligible speech by utilizing compensatory strategies to address the use of adequate labial and lingual function, respiratory support, and increased articulatory precision for speech.    Baseline Does not use strategies.    Time 5    Period Weeks    Status Achieved            Plan - 11/28/20 1418    Clinical Impression Statement Pt is a 75 yo male seen following CVA. Pt w/ complaints of speech impairment. L facial drooping and decreased sensitivity noted on assessment. SLP assessed cognitive-communication via SLUMS and found pt to be [facility]. Speech was assessed and found to be impaired. Pt presented with mild dysarthria characterized by imprecise consonant production, slow speech rate, and reduced loudness. Speech intelligibility noted to be 100% in a known context. SLP rec pt be discharged from speech  services after meeting all goals.    Speech Therapy Frequency 1x /week    Duration 8 weeks    Treatment/Interventions Compensatory strategies;Functional tasks;Patient/family education;Environmental controls;Multimodal communcation approach;Internal/external aids;SLP instruction and feedback;Compensatory techniques    SLP Home Exercise Plan Dysarthria strategies    Consulted and Agree with Plan of Care Patient           Patient will benefit from skilled therapeutic intervention in order to improve the following deficits and impairments:   Dysarthria and anarthria    Problem List Patient Active Problem List   Diagnosis Date Noted  . CVA (cerebral vascular accident) ([place]) 10/21/2020  . Bilateral primary osteoarthritis of  knee 02/15/2020  . Acquired trigger finger of right little finger 11/03/2019  . Encounter for orthopedic follow-up care 10/13/2019  . H/O total hip arthroplasty, bilateral 10/24/2018  . Gout 08/18/2017  . Overweight (BMI 25.0-29.9) 01/23/2017  . Foot pain, left 02/11/2016  . Sebaceous cyst 07/14/2014  . Sun-damaged skin 07/14/2014  . 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/14/2014  . Physical exam, annual 05/17/2012  . Inguinal hernia 08/12/2010  . 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 TOBACCO USER 05/01/2010  . Paroxysmal a-fib ([place]) 05/01/2010  . ABNORMAL ELECTROCARDIOGRAM 03/26/2010   SPEECH THERAPY DISCHARGE SUMMARY  Visits from Start of Care: 5  Current functional level related to goals / functional outcomes: WFL   Remaining deficits: N/A   Education / Equipment: N/A Plan: Patient agrees to discharge.  Patient goals were met. Patient is being discharged due to meeting the stated rehab goals.  ?????      Ihor Austin, [place], CBIS   11/28/2020, 2:21 PM  [address]. [place], [place], [address] Phone: [phone]   Fax:  [phone]   Name: JOURDON ZIMMERLE MRN:  [id number] Date of Birth: [birth year]

## 2020-11-28 NOTE — Telephone Encounter (Signed)
[employer] exception for Eliquis submitted via CoverMymeds.   Pending review.

## 2020-12-04 ENCOUNTER — Encounter: Payer: Self-pay | Admitting: Physical Therapy

## 2020-12-04 ENCOUNTER — Encounter: Payer: Self-pay | Admitting: Occupational Therapy

## 2020-12-04 ENCOUNTER — Other Ambulatory Visit: Payer: Self-pay

## 2020-12-04 ENCOUNTER — Ambulatory Visit: Payer: Medicare Other | Admitting: Occupational Therapy

## 2020-12-04 ENCOUNTER — Ambulatory Visit: Payer: Medicare Other | Admitting: Physical Therapy

## 2020-12-04 DIAGNOSIS — R29818 Other symptoms and signs involving the nervous system: Secondary | ICD-10-CM | POA: Diagnosis not present

## 2020-12-04 DIAGNOSIS — R278 Other lack of coordination: Secondary | ICD-10-CM

## 2020-12-04 DIAGNOSIS — R2681 Unsteadiness on feet: Secondary | ICD-10-CM

## 2020-12-04 DIAGNOSIS — I69354 Hemiplegia and hemiparesis following cerebral infarction affecting left non-dominant side: Secondary | ICD-10-CM

## 2020-12-04 DIAGNOSIS — M6281 Muscle weakness (generalized): Secondary | ICD-10-CM | POA: Diagnosis not present

## 2020-12-04 DIAGNOSIS — R202 Paresthesia of skin: Secondary | ICD-10-CM

## 2020-12-04 NOTE — Therapy (Signed)
[place]. [place], [place], [address] Phone: [phone]   Fax:  [phone]  Physical Therapy Treatment  Patient Details  Name: [name] Short MRN: [id number] Date of Birth: 1946/09/25 Referring Provider (PT): Orlean Patten Date: 12/04/2020   PT End of Session - 12/04/20 1418    Visit Number 5    Number of Visits 9    Date for PT Re-Evaluation 12/24/20    PT Start Time 6503    PT Stop Time 1359    PT Time Calculation (min) 40 min    Activity Tolerance Patient tolerated treatment well    Behavior During Therapy [facility] for tasks assessed/performed           Past Medical History:  Diagnosis Date  . Arthritis    WRIST  . Benign localized prostatic hyperplasia with lower urinary tract symptoms (LUTS)   . Complication of anesthesia    slow to wake  . ED (erectile dysfunction)   . GERD (gastroesophageal reflux disease)   . History of concussion    AS CHILD--  NO RESIDUAL  . History of gout   . History of kidney stones   . Hyperlipidemia   . Inguinal hernia, bilateral   . Nephrolithiasis    BILATERAL  . PAF (paroxysmal atrial fibrillation) ([place]) currently being followed by pcp   EPISODE --  2011  FOLLOW-UP W/ DR WALL  /  HAS NOT SEEN ANY CARDIOLOGIST SINCE    Past Surgical History:  Procedure Laterality Date  . APPENDECTOMY  1983  . CARDIOVASCULAR STRESS TEST  05-08-2010  DR WALL   NO EVIDENCE OF SCAR OR ISCHEMIA/ EF 54%/  PT HAD BOTH AFIB/ AFLUTTER DURING STUDY  . CARPAL TUNNEL RELEASE Right 01/ 14/ 2021  . CYSTOSCOPY W/ URETERAL STENT PLACEMENT Left 09/20/2013   Procedure: CYSTOSCOPY WITH STENT REPLACEMENT;  Surgeon: Fredricka Bonine, MD;  Location: [facility];  Service: Urology;  Laterality: Left;  . CYSTOSCOPY WITH URETEROSCOPY Left 09/20/2013   Procedure: CYSTOSCOPY WITH URETEROSCOPY;  Surgeon: Fredricka Bonine, MD;  Location: [facility];   Service: Urology;  Laterality: Left;  . CYSTOSCOPY WITH URETEROSCOPY AND STENT PLACEMENT Left 07/12/2013   Procedure: CYSTOSCOPY WITH LITHOLAPEXY, LEFT URETERAL STENT PLACEMENT;  Surgeon: Fredricka Bonine, MD;  Location: [facility];  Service: Urology;  Laterality: Left;  . CYSTOSCOPY WITH URETEROSCOPY AND STENT PLACEMENT Bilateral 06/23/2014   Procedure: BILATERAL URETEROSCOPY, HOLMIUM LASER LITHOTRIPSY AND STENT PLACEMENT, RIGHT ;  Surgeon: Festus Aloe, MD;  Location: [facility];  Service: Urology;  Laterality: Bilateral;  . EXTRACORPOREAL SHOCK WAVE LITHOTRIPSY  X2  . EXTRACORPOREAL SHOCK WAVE LITHOTRIPSY Left 08-29-2013;   07-28-2013  . EXTRACORPOREAL SHOCK WAVE LITHOTRIPSY Right 12/09/2018   Procedure: EXTRACORPOREAL SHOCK WAVE LITHOTRIPSY (ESWL);  Surgeon: Festus Aloe, MD;  Location: [facility];  Service: Urology;  Laterality: Right;  . HOLMIUM LASER APPLICATION Left 54/65/6812   Procedure: HOLMIUM LASER APPLICATION;  Surgeon: Fredricka Bonine, MD;  Location: [facility];  Service: Urology;  Laterality: Left;  . INGUINAL HERNIA REPAIR Bilateral 10/16/2020   Procedure: BILATERAL OPEN INGUINAL HERNIA REPAIR WITH MESH;  Surgeon: Coralie Keens, MD;  Location: [place];  Service: General;  Laterality: Bilateral;  LMA/TAP BLOCK  . LAPAROSCOPIC INGUINAL HERNIA REPAIR Bilateral 09-04-2010   w/ mesh  . TONSILLECTOMY  AS CHILD  . 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 TOTAL HIP ARTHROPLASTY  Right 11/15/2012   Procedure: RIGHT TOTAL HIP ARTHROPLASTY ANTERIOR APPROACH;  Surgeon: Marybelle Killings, MD;  Location: [facility];  Service: Orthopedics;  Laterality: Right;  Right total hip arthroplasty  . TRANSTHORACIC ECHOCARDIOGRAM  05-08-2010   NORMAL LVSF/  EF 23%/  GRADE I DIASTOLIC DYSFUNCTION/  MILD BILATERAL ATRIUM ENLARGEMENT    There were no vitals filed for this visit.   Subjective Assessment - 12/04/20 1324     Subjective Pt reports episode of gout has cleared up and not having pain in foot anymore.    Currently in Pain? No/denies    Pain Score 0-No pain                             [facility] Adult PT Treatment/Exercise - 12/04/20 0001      High Level Balance   High Level Balance Comments marching on airex, rhomberg airex EO/EC, lat/fwd step ups onto airex, STS on airex with 2# OHP, airex with head turns horizontal/vertical      Knee/Hip Exercises: Aerobic   Nustep L5 x 6 min      Knee/Hip Exercises: Seated   Sit to Sand 20 reps;without UE support   3# chest press                   PT Short Term Goals - 11/06/20 1547      PT SHORT TERM GOAL #1   Title Pt will be independent with initial HEP    Time 2    Period Weeks    Status Partially Met    Target Date 11/12/20             PT Long Term Goals - 10/29/20 1758      PT LONG TERM GOAL #1   Title Pt will be able to complete gait with vertical and horizontal head turns without LOB and with minimal to no gait deviations to facilitate safe negotiation of busy community environments.    Time 8    Period Weeks    Status New    Target Date 12/24/20      PT LONG TERM GOAL #2   Title Pt will complete 5TSTS in no greater than 12 seconds to demo improved BLE strength and funcitonal balance.    Time 8    Period Weeks    Status New    Target Date 12/24/20      PT LONG TERM GOAL #3   Title Pt will be able to negotiate standard height steps safely with alternating pattern and no HR without LOB.    Time 8    Period Weeks    Status New    Target Date 12/24/20      PT LONG TERM GOAL #4   Title Pt will be able to hold MCTSIB condition #4 balance with feet together and EC on foam for at least 20 seconds with minimal sway and no LOB    Time 8    Period Weeks    Status New    Target Date 12/24/20                 Plan - 12/04/20 1420    Clinical Impression Statement Pt tolerated progression of TE well.  Close supervision-CGA with high level balance ex's. Most instability with sidestepping. Pt with occasional LOB while walking with head turns requiring CGA. Continue to push high level balance and functional ex's.    PT Treatment/Interventions  ADLs/Self Care Home Management;Biofeedback;Electrical Stimulation;Cryotherapy;Moist Heat;Neuromuscular re-education;Balance training;Therapeutic exercise;Therapeutic activities;Functional mobility training;Stair training;Gait training;Patient/family education;Manual techniques;Energy conservation;Passive range of motion;Taping;Vasopneumatic Device    PT Next Visit Plan Take baseline VS pre activity and as needed. Focus of sessions on endurance, higher level balance training, and general strengthening.    Consulted and Agree with Plan of Care Patient           Patient will benefit from skilled therapeutic intervention in order to improve the following deficits and impairments:  Abnormal gait,Decreased coordination,Decreased endurance,Decreased activity tolerance,Pain,Improper body mechanics,Impaired flexibility,Decreased balance,Decreased mobility,Decreased strength,Postural dysfunction  Visit Diagnosis: Other lack of coordination  Muscle weakness (generalized)  Unsteadiness on feet  Hemiplegia and hemiparesis following cerebral infarction affecting left non-dominant side [facility])     Problem List Patient Active Problem List   Diagnosis Date Noted  . CVA (cerebral vascular accident) ([place]) 10/21/2020  . Bilateral primary osteoarthritis of knee 02/15/2020  . Acquired trigger finger of right little finger 11/03/2019  . Encounter for orthopedic follow-up care 10/13/2019  . H/O total hip arthroplasty, bilateral 10/24/2018  . Gout 08/18/2017  . Overweight (BMI 25.0-29.9) 01/23/2017  . Foot pain, left 02/11/2016  . Sebaceous cyst 07/14/2014  . Sun-damaged skin 07/14/2014  . 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/14/2014  . Physical exam, annual 05/17/2012  .  Inguinal hernia 08/12/2010  . 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 TOBACCO USER 05/01/2010  . Paroxysmal a-fib ([place]) 05/01/2010  . ABNORMAL ELECTROCARDIOGRAM 03/26/2010   Amador Cunas, PT, DPT Donald Prose Albirta Rhinehart 12/04/2020, 2:27 PM  [place]. [place], [place], [address] Phone: [phone]   Fax:  [phone]  Name: [name] Short MRN: [id number] Date of Birth: 04-28-1946

## 2020-12-05 NOTE — Therapy (Signed)
[facility] [address] [place], [place], [address] Phone: [phone]   Fax:  [phone]  Occupational Therapy Treatment  Patient Details  Name: [name] Short MRN: [id number] Date of Birth: March 16, 1946 Referring Provider (OT): Zannie Cove, MD   Encounter Date: 12/04/2020   OT End of Session - 12/04/20 1414    Visit Number 6    Number of Visits 17    Date for OT Re-Evaluation 12/28/20    Authorization Type Medicare Part A and B    Progress Note Due on Visit 10    OT Start Time 1358    OT Stop Time 1441    OT Time Calculation (min) 43 min    Equipment Utilized During Treatment 9-Hole Peg Test, Box and Blocks Test, goniometer, grip and pinch dynamometer    Activity Tolerance Patient tolerated treatment well    Behavior During Therapy [facility] for tasks assessed/performed           Past Medical History:  Diagnosis Date  . Arthritis    WRIST  . Benign localized prostatic hyperplasia with lower urinary tract symptoms (LUTS)   . Complication of anesthesia    slow to wake  . ED (erectile dysfunction)   . GERD (gastroesophageal reflux disease)   . History of concussion    AS CHILD--  NO RESIDUAL  . History of gout   . History of kidney stones   . Hyperlipidemia   . Inguinal hernia, bilateral   . Nephrolithiasis    BILATERAL  . PAF (paroxysmal atrial fibrillation) ([facility]) currently being followed by pcp   EPISODE --  2011  FOLLOW-UP W/ DR WALL  /  HAS NOT SEEN ANY CARDIOLOGIST SINCE    Past Surgical History:  Procedure Laterality Date  . APPENDECTOMY  1983  . CARDIOVASCULAR STRESS TEST  05-08-2010  DR WALL   NO EVIDENCE OF SCAR OR ISCHEMIA/ EF 54%/  PT HAD BOTH AFIB/ AFLUTTER DURING STUDY  . CARPAL TUNNEL RELEASE Right 01/ 14/ 2021  . CYSTOSCOPY W/ URETERAL STENT PLACEMENT Left 09/20/2013   Procedure: CYSTOSCOPY WITH STENT REPLACEMENT;  Surgeon: Antony Haste, MD;  Location[facility];   Service: Urology;  Laterality: Left;  . CYSTOSCOPY WITH URETEROSCOPY Left 09/20/2013   Procedure: CYSTOSCOPY WITH URETEROSCOPY;  Surgeon: Antony Haste, MD;  Location: [facility];  Service: Urology;  Laterality: Left;  . CYSTOSCOPY WITH URETEROSCOPY AND STENT PLACEMENT Left 07/12/2013   Procedure: CYSTOSCOPY WITH LITHOLAPEXY, LEFT URETERAL STENT PLACEMENT;  Surgeon: Antony Haste, MD;  Location: [facility];  Service: Urology;  Laterality: Left;  . CYSTOSCOPY WITH URETEROSCOPY AND STENT PLACEMENT Bilateral 06/23/2014   Procedure: BILATERAL URETEROSCOPY, HOLMIUM LASER LITHOTRIPSY AND STENT PLACEMENT, RIGHT ;  Surgeon: Jerilee Field, MD;  [facility];  Service: Urology;  Laterality: Bilateral;  . EXTRACORPOREAL SHOCK WAVE LITHOTRIPSY  X2  . EXTRACORPOREAL SHOCK WAVE LITHOTRIPSY Left 08-29-2013;   07-28-2013  . EXTRACORPOREAL SHOCK WAVE LITHOTRIPSY Right 12/09/2018   Procedure: EXTRACORPOREAL SHOCK WAVE LITHOTRIPSY (ESWL);  Surgeon: Jerilee Field, MD;  [facility];  Service: Urology;  Laterality: Right;  . HOLMIUM LASER APPLICATION Left 09/20/2013   Procedure: HOLMIUM LASER APPLICATION;  Surgeon: Antony Haste, MD;  [facility];  Service: Urology;  Laterality: Left;  . INGUINAL HERNIA REPAIR Bilateral 10/16/2020   Procedure: BILATERAL OPEN INGUINAL HERNIA REPAIR WITH MESH;  Surgeon: Abigail Miyamoto, MD;  Location: [facility];  Service: General;  Laterality: Bilateral;  LMA/TAP BLOCK  . LAPAROSCOPIC INGUINAL HERNIA REPAIR Bilateral 09-04-2010   w/ mesh  . TONSILLECTOMY  AS CHILD  . 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 TOTAL HIP ARTHROPLASTY Right 11/15/2012   Procedure: RIGHT TOTAL HIP ARTHROPLASTY ANTERIOR APPROACH;  Surgeon: Eldred MangesMark C Yates, MD;  Location[facility];  Service: Orthopedics;  Laterality: Right;  Right total hip arthroplasty  . TRANSTHORACIC  ECHOCARDIOGRAM  05-08-2010   NORMAL LVSF/  EF 55%/  GRADE I DIASTOLIC DYSFUNCTION/  MILD BILATERAL ATRIUM ENLARGEMENT    There were no vitals filed for this visit.   Subjective Assessment - 12/04/20 1359    Subjective  Pt reports the altered sensation and tightness in his L index and long fingers continues to be persistent    Pertinent History Carpal tunnel release of R hand (January 2021); PMHx of a-fib, gout, OA, and HLD    Patient Stated Goals Improve functional use of L hand; return to activities pt was participating in prior to onset    Currently in Pain? No/denies    Pain Onset In the past 7 days            OT Treatments/Exercises (OP) - 12/04/20 1430      Sensation Exercises   Desensitization Education provided on gentle massage and introductory desensitization techniques for trial at home to attempt to alleviate symptoms of stiffness/altered sensation in L digits      Fine Motor Coordination (Hand/Wrist)   Fine Motor Coordination In hand manipuation training;Flipping cards;Nuts and Bolts    In Materials engineerHand Manipulation Training Palm to finger translation w/ L hand using an oversized marble; 10x   OT provided modeling to assist w/ problem-solving during activity   Flipping cards Using tripod pinch with L hand to pull standard playing cards off the deck and flipping each over onto tabletop one-by-one    Nuts and Bolts Fastening and unfastening of differently sized nuts and bolts   Pt demo'd some increased difficulty w/ hex bolts compared with allen head bolts, as well as w/ small bolts vs larger bolts           OT Education - 12/04/20 1400    Education Details Education provided on coordination activities pt can participate in at home to continue to improve FMC/dexterity; handout given to pt w/ prioritized items highlighted. OT also provided education on gentle massage/introductory desensitization techniques w/ corresponding handout given to pt.    Person(s) Educated Patient     Methods Explanation;Handout    Comprehension Verbalized understanding            OT Short Term Goals - 11/28/20 1651      OT SHORT TERM GOAL #1   Title Pt will independently identify and demonstrate understanding of at least 3 compensatory/adaptive strategies, including AE, to be incorporated during BADLs    Baseline No currect compensatory/adaptive strategies    Time 4    Period Weeks    Status On-going    Target Date 11/30/20      OT SHORT TERM GOAL #2   Title Pt will improve functional FMC for manipulating clothing fasteners as evidenced by placing at least 50% of easy-grip pegs into pegboard independently    Baseline Unable to complete 9-Hole Peg Test due to decreased FMC/dexterity    Time 4    Period Weeks    Status Achieved   11/28/20 - able to place 100% of easy-grip pegs into pegboard independently  OT SHORT TERM GOAL #3   Title Pt will be able to don his socks and shoes, using AE prn, at least 75% of the time    Baseline Requires assist from wife w/ socks and shoes    Time 4    Period Weeks    Status New    Target Date 12/28/20            OT Long Term Goals - 11/06/20 2119      OT LONG TERM GOAL #1   Title Pt will be independent with HEP designed for strengthening and coordination of LUE and report carryover at home    Baseline No current HEP    Time 8    Period Weeks    Status On-going      OT LONG TERM GOAL #2   Title Pt will complete UB/LB dressing with Mod I    Baseline SPV/Safety; unable to manipulate fasteners    Time 8    Period Weeks    Status On-going      OT LONG TERM GOAL #3   Title Pt will complete symmetrical/asymmetrical bilateral coordination activities with Mod I at least 75% of the time to improve participation in grooming tasks    Baseline Pt reports difficulty using left hand to stabilize during tasks required both hands    Time 8    Period Weeks    Status On-going      OT LONG TERM GOAL #4   Title Pt will improve functional LUE  use to facilitate return to leisure activities as evidenced by increasing Box and Block Test by at least 10 blocks with L hand    Baseline Box and Block Test: R hand (46 blocks); L hand (29 blocks)    Time 8    Period Weeks    Status On-going            Plan - 12/04/20 1430    Clinical Impression Statement Pt showed improvements in [facility] and dexterity of L hand this session, demonstrating ability to fasten/unfasten nuts and bolts and manipulate playing cards. In-hand manipulation continues to be limiting, primarily due to stiffness and decreased coordination of digits. Due to pt's report of persistent stiffness and altered sensation in L index and long fingers, OT discussed desensitization and gentle massage for trial at home to potentially help alleviate symptoms.    OT Occupational Profile and History Detailed Assessment- Review of Records and additional review of physical, cognitive, psychosocial history related to current functional performance    Occupational performance deficits (Please refer to evaluation for details): ADL's;IADL's;Leisure    Body Structure / Function / Physical Skills ADL;ROM;UE functional use;FMC;Decreased knowledge of use of DME;Body mechanics;Dexterity;Sensation;GMC;Strength;Pain;Coordination;IADL;Cardiopulmonary status limiting activity;Vision    Rehab Potential Good    Clinical Decision Making Several treatment options, min-mod task modification necessary    Comorbidities Affecting Occupational Performance: Presence of comorbidities impacting occupational performance    Modification or Assistance to Complete Evaluation  Min-Moderate modification of tasks or assist with assess necessary to complete eval    OT Frequency 2x / week    OT Duration 8 weeks    OT Treatment/Interventions Self-care/ADL training;Electrical Stimulation;Therapeutic exercise;Visual/perceptual remediation/compensation;Moist Heat;Neuromuscular education;Patient/family education;Energy  conservation;Fluidtherapy;Therapeutic activities;Cryotherapy;Paraffin;DME and/or AE instruction;Contrast Bath;Manual Therapy;Passive range of motion;Cognitive remediation/compensation    Plan Functional bilateral coordination; Assess dressing    Consulted and Agree with Plan of Care Patient;Family member/caregiver    Family Member Consulted Wife           Patient  will benefit from skilled therapeutic intervention in order to improve the following deficits and impairments:   Body Structure / Function / Physical Skills: ADL,ROM,UE functional use,FMC,Decreased knowledge of use of DME,Body mechanics,Dexterity,Sensation,GMC,Strength,Pain,Coordination,IADL,Cardiopulmonary status limiting activity,Vision       Visit Diagnosis: Hemiplegia and hemiparesis following cerebral infarction affecting left non-dominant side ([facility])  Other lack of coordination  Muscle weakness (generalized)  Paresthesia of skin  Other symptoms and signs involving the nervous system    Problem List Patient Active Problem List   Diagnosis Date Noted  . CVA (cerebral vascular accident) ([facility]) 10/21/2020  . Bilateral primary osteoarthritis of knee 02/15/2020  . Acquired trigger finger of right little finger 11/03/2019  . Encounter for orthopedic follow-up care 10/13/2019  . H/O total hip arthroplasty, bilateral 10/24/2018  . Gout 08/18/2017  . Overweight (BMI 25.0-29.9) 01/23/2017  . Foot pain, left 02/11/2016  . Sebaceous cyst 07/14/2014  . Sun-damaged skin 07/14/2014  . 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/14/2014  . Physical exam, annual 05/17/2012  . Inguinal hernia 08/12/2010  . 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 TOBACCO USER 05/01/2010  . Paroxysmal a-fib ([facility]) 05/01/2010  . ABNORMAL ELECTROCARDIOGRAM 03/26/2010     Rosie Fate, OTR/L, MSOT 12/05/2020, 8:19 PM  [facility] [address] [place], [place], [address] Phone: [phone]   Fax:   [phone]  Name: MARGUERITE JARBOE MRN: [id number] Date of Birth: 14-Dec-1945

## 2020-12-10 ENCOUNTER — Encounter: Payer: Self-pay | Admitting: *Deleted

## 2020-12-10 ENCOUNTER — Encounter: Payer: Medicare Other | Admitting: Occupational Therapy

## 2020-12-10 NOTE — Telephone Encounter (Signed)
Received fax from [facility] Part D stating tier exception denies due to Xarelto being available at a lower cost/tier.     Updated patient via Mychart.   Will send to MD to review for possible transition if needed?

## 2020-12-10 NOTE — Telephone Encounter (Signed)
Yes lets switch to Xarelto

## 2020-12-12 ENCOUNTER — Ambulatory Visit: Payer: Medicare Other

## 2020-12-12 ENCOUNTER — Ambulatory Visit: Payer: Medicare Other | Admitting: Occupational Therapy

## 2020-12-12 ENCOUNTER — Other Ambulatory Visit: Payer: Self-pay

## 2020-12-12 ENCOUNTER — Encounter: Payer: Self-pay | Admitting: Occupational Therapy

## 2020-12-12 DIAGNOSIS — R278 Other lack of coordination: Secondary | ICD-10-CM

## 2020-12-12 DIAGNOSIS — I69354 Hemiplegia and hemiparesis following cerebral infarction affecting left non-dominant side: Secondary | ICD-10-CM

## 2020-12-12 DIAGNOSIS — R29818 Other symptoms and signs involving the nervous system: Secondary | ICD-10-CM

## 2020-12-12 DIAGNOSIS — M6281 Muscle weakness (generalized): Secondary | ICD-10-CM | POA: Diagnosis not present

## 2020-12-12 DIAGNOSIS — R2681 Unsteadiness on feet: Secondary | ICD-10-CM | POA: Diagnosis not present

## 2020-12-12 DIAGNOSIS — R202 Paresthesia of skin: Secondary | ICD-10-CM

## 2020-12-12 NOTE — Therapy (Signed)
[facility] [address] [place], [place], [address] Phone: [phone]   Fax:  [phone]  Occupational Therapy Treatment  Patient Details  Name: [name] Short MRN: [id number] Date of Birth: 03/29/1946 Referring Provider (OT): Zannie Cove, MD   Encounter Date: 12/12/2020   OT End of Session - 12/12/20 1449    Visit Number 7    Number of Visits 17    Date for OT Re-Evaluation 12/28/20    Authorization Type Medicare Part A and B    Progress Note Due on Visit 10    OT Start Time 1445    OT Stop Time 1530    OT Time Calculation (min) 45 min    Activity Tolerance Patient tolerated treatment well    Behavior During Therapy [facility] for tasks assessed/performed           Past Medical History:  Diagnosis Date  . Arthritis    WRIST  . Benign localized prostatic hyperplasia with lower urinary tract symptoms (LUTS)   . Complication of anesthesia    slow to wake  . ED (erectile dysfunction)   . GERD (gastroesophageal reflux disease)   . History of concussion    AS CHILD--  NO RESIDUAL  . History of gout   . History of kidney stones   . Hyperlipidemia   . Inguinal hernia, bilateral   . Nephrolithiasis    BILATERAL  . PAF (paroxysmal atrial fibrillation) (HCC) currently being followed by pcp   EPISODE --  2011  FOLLOW-UP W/ DR WALL  /  HAS NOT SEEN ANY CARDIOLOGIST SINCE    Past Surgical History:  Procedure Laterality Date  . APPENDECTOMY  1983  . CARDIOVASCULAR STRESS TEST  05-08-2010  DR WALL   NO EVIDENCE OF SCAR OR ISCHEMIA/ EF 54%/  PT HAD BOTH AFIB/ AFLUTTER DURING STUDY  . CARPAL TUNNEL RELEASE Right 01/ 14/ 2021  . CYSTOSCOPY W/ URETERAL STENT PLACEMENT Left 09/20/2013   Procedure: CYSTOSCOPY WITH STENT REPLACEMENT;  Surgeon: Antony Haste, MD;  Location: [facility];  Service: Urology;  Laterality: Left;  . CYSTOSCOPY WITH URETEROSCOPY Left 09/20/2013   Procedure: CYSTOSCOPY WITH  URETEROSCOPY;  Surgeon: Antony Haste, MD;  Location: [facility];  Service: Urology;  Laterality: Left;  . CYSTOSCOPY WITH URETEROSCOPY AND STENT PLACEMENT Left 07/12/2013   Procedure: CYSTOSCOPY WITH LITHOLAPEXY, LEFT URETERAL STENT PLACEMENT;  Surgeon: Antony Haste, MD;  Location[facility];  Service: Urology;  Laterality: Left;  . CYSTOSCOPY WITH URETEROSCOPY AND STENT PLACEMENT Bilateral 06/23/2014   Procedure: BILATERAL URETEROSCOPY, HOLMIUM LASER LITHOTRIPSY AND STENT PLACEMENT, RIGHT ;  Surgeon: Jerilee Field, MD;  Location[facility];  Service: Urology;  Laterality: Bilateral;  . EXTRACORPOREAL SHOCK WAVE LITHOTRIPSY  X2  . EXTRACORPOREAL SHOCK WAVE LITHOTRIPSY Left 08-29-2013;   07-28-2013  . EXTRACORPOREAL SHOCK WAVE LITHOTRIPSY Right 12/09/2018   Procedure: EXTRACORPOREAL SHOCK WAVE LITHOTRIPSY (ESWL);  Surgeon: Jerilee Field, MD;  Location: [facility];  Service: Urology;  Laterality: Right;  . HOLMIUM LASER APPLICATION Left 09/20/2013   Procedure: HOLMIUM LASER APPLICATION;  Surgeon: Antony Haste, MD;  Location: [facility];  Service: Urology;  Laterality: Left;  . INGUINAL HERNIA REPAIR Bilateral 10/16/2020   Procedure: BILATERAL OPEN INGUINAL HERNIA REPAIR WITH MESH;  Surgeon: Abigail Miyamoto, MD;  Location: [facility];  Service: General;  Laterality: Bilateral;  LMA/TAP BLOCK  . LAPAROSCOPIC INGUINAL HERNIA REPAIR Bilateral 09-04-2010  w/ mesh  . TONSILLECTOMY  AS CHILD  . 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 TRANSTHORACIC ECHOCARDIOGRAM  05-08-2010   NORMAL LVSF/  EF 55%/  GRADE I DIASTOLIC DYSFUNCTION/  MILD BILATERAL ATRIUM ENLARGEMENT     There were no vitals filed for this visit.   Subjective Assessment - 12/12/20 1446    Subjective  Pt states he has started using throat lozenges since his last visit and they seem to have helped w/ dry mouth, which may have been impacting his speech    Pertinent History Carpal tunnel release of R hand (January 2021); PMHx of a-fib, gout, OA, and HLD    Patient Stated Goals Improve functional use of L hand; return to activities pt was participating in prior to onset    Currently in Pain? Yes    Pain Score 1     Pain Location Foot    Pain Orientation Right    Pain Descriptors / Indicators Aching            OT Treatments/Exercises (OP) - 12/12/20 1520      ADLs   LB Dressing Pt able to tie shoes w/ Mod I (requires visual input for success) in 2/2 trials    Compensatory Strategies OT reviewed compensatory strategies including completing activities w/ adequate lighting, using both hands when able, using button hook, and slowing down movements during FM tasks      Shoulder Exercises: Seated   Other Seated Exercises OT demonstrated shoulder flexion, extension, and elbow extension w/ theraband for pt to complete at home   Pt returned demonstration; red theraband administered to pt     Sensation Exercises   Stereognosis Pt reached into covered bowl to identify 8 diferent objects for sensory retraining of L hand/digits; pt required 1 hint to identify 2/8 objects and reported that he was unable to determine the shape of 3/8 objects   Items: marble, key, remote, seashell, dice, glasses, paperclip, pencil           OT Short Term Goals - 12/12/20 1553      OT SHORT TERM GOAL #1   Title Pt will independently identify and demonstrate understanding of at least 3 compensatory/adaptive strategies, including AE, to be incorporated during BADLs    Baseline No currect compensatory/adaptive strategies    Time 4    Period Weeks    Status On-going    Target Date 11/30/20      OT SHORT TERM  GOAL #2   Title Pt will improve functional FMC for manipulating clothing fasteners as evidenced by placing at least 50% of easy-grip pegs into pegboard independently    Baseline Unable to complete 9-Hole Peg Test due to decreased FMC/dexterity    Time 4    Period Weeks    Status Achieved   11/28/20 - able to place 100% of easy-grip pegs into pegboard independently     OT SHORT TERM GOAL #3   Title Pt will be able to don his socks and shoes, using AE prn, at least 75% of the time    Baseline Requires assist from wife w/ socks and shoes    Time 4    Period Weeks    Status Achieved   12/12/20 - pt reports no difficulty w/ donning/doffing socks or  shoes and demonstrated ability to tie shoes w/ Mod I   Target Date 12/28/20            OT Long Term Goals - 11/06/20 2119      OT LONG TERM GOAL #1   Title Pt will be independent with HEP designed for strengthening and coordination of LUE and report carryover at home    Baseline No current HEP    Time 8    Period Weeks    Status On-going      OT LONG TERM GOAL #2   Title Pt will complete UB/LB dressing with Mod I    Baseline SPV/Safety; unable to manipulate fasteners    Time 8    Period Weeks    Status On-going      OT LONG TERM GOAL #3   Title Pt will complete symmetrical/asymmetrical bilateral coordination activities with Mod I at least 75% of the time to improve participation in grooming tasks    Baseline Pt reports difficulty using left hand to stabilize during tasks required both hands    Time 8    Period Weeks    Status On-going      OT LONG TERM GOAL #4   Title Pt will improve functional LUE use to facilitate return to leisure activities as evidenced by increasing Box and Block Test by at least 10 blocks with L hand    Baseline Box and Block Test: R hand (46 blocks); L hand (29 blocks)    Time 8    Period Weeks    Status On-going            Plan - 12/12/20 1449    Clinical Impression Statement Pt continues to  demonstrate impaired tactile sensation, particularly when unable to incorporate visual input during tasks, which is limiting during functional activities; stereognosis activity completed this session to facilitate sensory retraining. BUE shoulder exercises w/ theraband loosely incorporated into current HEP to facilitate light strengthening (pt reports medical clearance of lifting restrictions above 15-20# approx. 11/27/20).    OT Occupational Profile and History Detailed Assessment- Review of Records and additional review of physical, cognitive, psychosocial history related to current functional performance    Occupational performance deficits (Please refer to evaluation for details): ADL's;IADL's;Leisure    Body Structure / Function / Physical Skills ADL;ROM;UE functional use;FMC;Decreased knowledge of use of DME;Body mechanics;Dexterity;Sensation;GMC;Strength;Pain;Coordination;IADL;Cardiopulmonary status limiting activity;Vision    Rehab Potential Good    Clinical Decision Making Several treatment options, min-mod task modification necessary    Comorbidities Affecting Occupational Performance: Presence of comorbidities impacting occupational performance    Modification or Assistance to Complete Evaluation  Min-Moderate modification of tasks or assist with assess necessary to complete eval    OT Frequency 2x / week    OT Duration 8 weeks    OT Treatment/Interventions Self-care/ADL training;Electrical Stimulation;Therapeutic exercise;Visual/perceptual remediation/compensation;Moist Heat;Neuromuscular education;Patient/family education;Energy conservation;Fluidtherapy;Therapeutic activities;Cryotherapy;Paraffin;DME and/or AE instruction;Contrast Bath;Manual Therapy;Passive range of motion;Cognitive remediation/compensation    Plan sensory retraining; functional bilateral coordination    Consulted and Agree with Plan of Care Patient;Family member/caregiver    Family Member Consulted Wife            Patient will benefit from skilled therapeutic intervention in order to improve the following deficits and impairments:   Body Structure / Function / Physical Skills: ADL,ROM,UE functional use,FMC,Decreased knowledge of use of DME,Body mechanics,Dexterity,Sensation,GMC,Strength,Pain,Coordination,IADL,Cardiopulmonary status limiting activity,Vision       Visit Diagnosis: Hemiplegia and hemiparesis following cerebral infarction affecting left non-dominant side (HCC)  Other lack  of coordination  Muscle weakness (generalized)  Paresthesia of skin  Other symptoms and signs involving the nervous system    Problem List Patient Active Problem List   Diagnosis Date Noted  . CVA (cerebral vascular accident) (HCC) 10/21/2020  . Bilateral primary osteoarthritis of knee 02/15/2020  . Acquired trigger finger of right little finger 11/03/2019  . Encounter for orthopedic follow-up care 10/13/2019  . H/O total hip arthroplasty, bilateral 10/24/2018  . Gout 08/18/2017  . Overweight (BMI 25.0-29.9) 01/23/2017  . Foot pain, left 02/11/2016  . Sebaceous cyst 07/14/2014  . Sun-damaged skin 07/14/2014  . 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/14/2014  . Physical exam, annual 05/17/2012  . Inguinal hernia 08/12/2010  . 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 TOBACCO USER 05/01/2010  . Paroxysmal a-fib (HCC) 05/01/2010  . ABNORMAL ELECTROCARDIOGRAM 03/26/2010     Rosie FateKelsey Xaria Judon, OTR/L, MSOT 12/12/2020, 4:15 PM  [facility] [address] [place], [place], [id number] Phone: [phone]   Fax:  [phone]  Name: [name] Short MRN: [id number] Date of Birth: Sep 16, 1946

## 2020-12-12 NOTE — Therapy (Signed)
[place] [name], [place], [address] Phone: [phone]   Fax:  [phone]  Physical Therapy Treatment  Patient Details  Name: [name] Short MRN: [id number] Date of Birth: 09/04/1946 Referring Provider (PT): Orlean Patten Date: 12/12/2020   PT End of Session - 12/12/20 1441    Visit Number 6    Number of Visits 9    Date for PT Re-Evaluation 12/24/20    PT Start Time 1400    PT Stop Time 1445    PT Time Calculation (min) 45 min    Activity Tolerance Patient tolerated treatment well    Behavior During Therapy [facility] for tasks assessed/performed           Past Medical History:  Diagnosis Date  . Arthritis    WRIST  . Benign localized prostatic hyperplasia with lower urinary tract symptoms (LUTS)   . Complication of anesthesia    slow to wake  . ED (erectile dysfunction)   . GERD (gastroesophageal reflux disease)   . History of concussion    AS CHILD--  NO RESIDUAL  . History of gout   . History of kidney stones   . Hyperlipidemia   . Inguinal hernia, bilateral   . Nephrolithiasis    BILATERAL  . PAF (paroxysmal atrial fibrillation) ([place]) currently being followed by pcp   EPISODE --  2011  FOLLOW-UP W/ DR WALL  /  HAS NOT SEEN ANY CARDIOLOGIST SINCE    Past Surgical History:  Procedure Laterality Date  . APPENDECTOMY  1983  . CARDIOVASCULAR STRESS TEST  05-08-2010  DR WALL   NO EVIDENCE OF SCAR OR ISCHEMIA/ EF 54%/  PT HAD BOTH AFIB/ AFLUTTER DURING STUDY  . CARPAL TUNNEL RELEASE Right 01/ 14/ 2021  . CYSTOSCOPY W/ URETERAL STENT PLACEMENT Left 09/20/2013   Procedure: CYSTOSCOPY WITH STENT REPLACEMENT;  Surgeon: Fredricka Bonine, MD;  Location: [facility];  Service: Urology;  Laterality: Left;  . CYSTOSCOPY WITH URETEROSCOPY Left 09/20/2013   Procedure: CYSTOSCOPY WITH URETEROSCOPY;  Surgeon: Fredricka Bonine, MD;  Location: [facility];   Service: Urology;  Laterality: Left;  . CYSTOSCOPY WITH URETEROSCOPY AND STENT PLACEMENT Left 07/12/2013   Procedure: CYSTOSCOPY WITH LITHOLAPEXY, LEFT URETERAL STENT PLACEMENT;  Surgeon: Fredricka Bonine, MD;  Location: [facility];  Service: Urology;  Laterality: Left;  . CYSTOSCOPY WITH URETEROSCOPY AND STENT PLACEMENT Bilateral 06/23/2014   Procedure: BILATERAL URETEROSCOPY, HOLMIUM LASER LITHOTRIPSY AND STENT PLACEMENT, RIGHT ;  Surgeon: Festus Aloe, MD;  Location: [facility];  Service: Urology;  Laterality: Bilateral;  . EXTRACORPOREAL SHOCK WAVE LITHOTRIPSY  X2  . EXTRACORPOREAL SHOCK WAVE LITHOTRIPSY Left 08-29-2013;   07-28-2013  . EXTRACORPOREAL SHOCK WAVE LITHOTRIPSY Right 12/09/2018   Procedure: EXTRACORPOREAL SHOCK WAVE LITHOTRIPSY (ESWL);  Surgeon: Festus Aloe, MD;  Location: [facility];  Service: Urology;  Laterality: Right;  . HOLMIUM LASER APPLICATION Left 69/62/9528   Procedure: HOLMIUM LASER APPLICATION;  Surgeon: Fredricka Bonine, MD;  Location: [facility];  Service: Urology;  Laterality: Left;  . INGUINAL HERNIA REPAIR Bilateral 10/16/2020   Procedure: BILATERAL OPEN INGUINAL HERNIA REPAIR WITH MESH;  Surgeon: Coralie Keens, MD;  Location: [place];  Service: General;  Laterality: Bilateral;  LMA/TAP BLOCK  . LAPAROSCOPIC INGUINAL HERNIA REPAIR Bilateral 09-04-2010   w/ mesh  . TONSILLECTOMY  AS CHILD  . 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 TOTAL HIP ARTHROPLASTY  Right 11/15/2012   Procedure: RIGHT TOTAL HIP ARTHROPLASTY ANTERIOR APPROACH;  Surgeon: Marybelle Killings, MD;  Location: [place];  Service: Orthopedics;  Laterality: Right;  Right total hip arthroplasty  . TRANSTHORACIC ECHOCARDIOGRAM  05-08-2010   NORMAL LVSF/  EF 40%/  GRADE I DIASTOLIC DYSFUNCTION/  MILD BILATERAL ATRIUM ENLARGEMENT    There were no vitals filed for this visit.   Subjective Assessment - 12/12/20 1403     Subjective increased gout symptoms after last session    Pertinent History Hx of gout with flareups to the right foot, OA, A Fib, GERD, B hip replacements, carpal tunnel last january.    How long can you sit comfortably? Gets restless when sitting for long time.    Patient Stated Goals to get back to PLOF, get stronger    Currently in Pain? Yes    Pain Score 1     Pain Location Foot    Pain Orientation Right    Pain Descriptors / Indicators Aching                [facility] Adult PT Treatment/Exercise - 12/12/20 0001      High Level Balance   High Level Balance Activities Side stepping;Direction changes;Head turns;Negotiating over obstacles    High Level Balance Comments rhomberg airex EO/EC, trunk rotation s on Airex,  fwd step ups onto airex and 6" step + opposite knee drive up x 10 B (more difficult/unstable on the R), airex with head turns horizontal/vertical.. tandem stance ball tosses x 10 B (easier with left lead), side stepping over 3" hurdle x 10 B      Knee/Hip Exercises: Aerobic   Nustep L3x3 min      Knee/Hip Exercises: Seated   Sit to Sand 20 reps;without UE support   with feet on airex RPE 6 toward end of each set of 10.            PT Short Term Goals - 11/06/20 1547      PT SHORT TERM GOAL #1   Title Pt will be independent with initial HEP    Time 2    Period Weeks    Status Partially Met    Target Date 11/12/20             PT Long Term Goals - 12/12/20 1405      PT LONG TERM GOAL #1   Title Pt will be able to complete gait with vertical and horizontal head turns without LOB and with minimal to no gait deviations to facilitate safe negotiation of busy community environments.    Time 8    Period Weeks    Status Achieved      PT LONG TERM GOAL #2   Title Pt will complete 5TSTS in no greater than 12 seconds to demo improved BLE strength and funcitonal balance.    Time 8    Period Weeks    Status Achieved   12/12/20: 10 seconds     PT LONG TERM GOAL  #3   Title Pt will be able to negotiate standard height steps safely with alternating pattern and no HR without LOB.    Time 8    Period Weeks    Status On-going   12/12/20: able to complete 12 - 6" steps with 1 small LOB when taking 11th step but able to recover.     PT LONG TERM GOAL #4   Title Pt will be able to hold MCTSIB condition #4 balance with feet  together and EC on foam for at least 20 seconds with minimal sway and no LOB    Time 8    Period Weeks    Status On-going   3/16: able to hold 20 seconds with mod sway                Plan - 12/12/20 1442    Clinical Impression Statement Continues to tolerate exercises progressions well. 5TSTS reassessed today as well as walking head turns and stairs and goals for these 3 items were met today. Most of session otherwise was with focus on sidestepping and NBOS stability with some trunk rotation challenges.Plan to continue to push high level balance and gait with 3D challenges    Personal Factors and Comorbidities Age;Fitness;Time since onset of injury/illness/exacerbation;Comorbidity 3+    Comorbidities Hx of gout with recent flareups to the right foot, OA, A Fib, GERD, B hip replacements, carpal tunnel last january.    Examination-Activity Limitations Locomotion Level;Stairs;Lift    Examination-Participation Restrictions Community Activity;Interpersonal Relationship    Rehab Potential Good    PT Frequency 1x / week    PT Duration 8 weeks    PT Treatment/Interventions ADLs/Self Care Home Management;Biofeedback;Electrical Stimulation;Cryotherapy;Moist Heat;Neuromuscular re-education;Balance training;Therapeutic exercise;Therapeutic activities;Functional mobility training;Stair training;Gait training;Patient/family education;Manual techniques;Energy conservation;Passive range of motion;Taping;Vasopneumatic Device    PT Next Visit Plan Take baseline VS pre activity and as needed. Focus of sessions on endurance, higher level balance  training, and general strengthening.    PT Home Exercise Plan Standing hip ABD and SLS balance with counter support (educated on proper form and completion without trunk compensations), Standing balance with head turns (advised to complete this near support surface, in a corner as needed for safety).    Consulted and Agree with Plan of Care Patient           Patient will benefit from skilled therapeutic intervention in order to improve the following deficits and impairments:  Abnormal gait,Decreased coordination,Decreased endurance,Decreased activity tolerance,Pain,Improper body mechanics,Impaired flexibility,Decreased balance,Decreased mobility,Decreased strength,Postural dysfunction  Visit Diagnosis: Other lack of coordination  Muscle weakness (generalized)  Unsteadiness on feet  Hemiplegia and hemiparesis following cerebral infarction affecting left non-dominant side [facility])     Problem List Patient Active Problem List   Diagnosis Date Noted  . CVA (cerebral vascular accident) ([place]) 10/21/2020  . Bilateral primary osteoarthritis of knee 02/15/2020  . Acquired trigger finger of right little finger 11/03/2019  . Encounter for orthopedic follow-up care 10/13/2019  . H/O total hip arthroplasty, bilateral 10/24/2018  . Gout 08/18/2017  . Overweight (BMI 25.0-29.9) 01/23/2017  . Foot pain, left 02/11/2016  . Sebaceous cyst 07/14/2014  . Sun-damaged skin 07/14/2014  . 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/14/2014  . Physical exam, annual 05/17/2012  . Inguinal hernia 08/12/2010  . 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 TOBACCO USER 05/01/2010  . Paroxysmal a-fib ([place]) 05/01/2010  . ABNORMAL ELECTROCARDIOGRAM 03/26/2010    Hall Busing , PT, DPT 12/12/2020, 2:48 PM  [place]. [place], [place], [address] Phone: [phone]   Fax:  [phone]  Name: DEQUAN KINDRED MRN: [id number] Date of Birth:  06/04/1946

## 2020-12-13 DIAGNOSIS — I480 Paroxysmal atrial fibrillation: Secondary | ICD-10-CM

## 2020-12-13 NOTE — Telephone Encounter (Signed)
When patient was in office his insurance stated Xarelto and Eliquis where in the same tier, but we can try Xarelto.  Dose is 20mg  daily (with supper), start 24 hour after last Eliquis dose

## 2020-12-17 ENCOUNTER — Other Ambulatory Visit: Payer: Self-pay

## 2020-12-17 ENCOUNTER — Ambulatory Visit: Payer: Medicare Other

## 2020-12-17 ENCOUNTER — Ambulatory Visit: Payer: Medicare Other | Admitting: Occupational Therapy

## 2020-12-17 ENCOUNTER — Encounter: Payer: Self-pay | Admitting: Occupational Therapy

## 2020-12-17 DIAGNOSIS — R202 Paresthesia of skin: Secondary | ICD-10-CM

## 2020-12-17 DIAGNOSIS — M6281 Muscle weakness (generalized): Secondary | ICD-10-CM | POA: Diagnosis not present

## 2020-12-17 DIAGNOSIS — R278 Other lack of coordination: Secondary | ICD-10-CM | POA: Diagnosis not present

## 2020-12-17 DIAGNOSIS — R29818 Other symptoms and signs involving the nervous system: Secondary | ICD-10-CM

## 2020-12-17 DIAGNOSIS — R2681 Unsteadiness on feet: Secondary | ICD-10-CM

## 2020-12-17 DIAGNOSIS — I69354 Hemiplegia and hemiparesis following cerebral infarction affecting left non-dominant side: Secondary | ICD-10-CM | POA: Diagnosis not present

## 2020-12-17 NOTE — Patient Instructions (Signed)
Access Code: [email or web address] URL: [email or web address] Date: 12/17/2020 Prepared by: Claude Manges  Exercises Standing Hip Abduction with Counter Support - 1 x daily - 7 x weekly - 3 sets - 10 reps Standing Marching - 1 x daily - 7 x weekly - 3 sets - 10 reps Alternating Backward Step - 1 x daily - 7 x weekly - 3 sets - 10 reps Sit to Stand with Arms Crossed - 1 x daily - 7 x weekly - 3 sets - 10 reps Standing Toe Raises at Chair - 1 x daily - 7 x weekly - 2 sets - 10 reps Lateral Step Down - 1 x daily - 7 x weekly - 2 sets - 10 reps

## 2020-12-17 NOTE — Therapy (Signed)
[facility] [address] [place], [place], [address] Phone: [phone]   Fax:  [phone]  Physical Therapy Treatment  Patient Details  Name: [name] Short MRN: [id number] Date of Birth: 1946-02-01 Referring Provider (PT): Ernie AvenaJoseph   Encounter Date: 12/17/2020   PT End of Session - 12/17/20 1323    Visit Number 7    Number of Visits 9    Date for PT Re-Evaluation 12/24/20    PT Start Time 1316    PT Stop Time 1400    PT Time Calculation (min) 44 min    Activity Tolerance Patient tolerated treatment well    Behavior During Therapy [facility] for tasks assessed/performed           Past Medical History:  Diagnosis Date  . Arthritis    WRIST  . Benign localized prostatic hyperplasia with lower urinary tract symptoms (LUTS)   . Complication of anesthesia    slow to wake  . ED (erectile dysfunction)   . GERD (gastroesophageal reflux disease)   . History of concussion    AS CHILD--  NO RESIDUAL  . History of gout   . History of kidney stones   . Hyperlipidemia   . Inguinal hernia, bilateral   . Nephrolithiasis    BILATERAL  . PAF (paroxysmal atrial fibrillation) (HCC) currently being followed by pcp   EPISODE --  2011  FOLLOW-UP W/ DR WALL  /  HAS NOT SEEN ANY CARDIOLOGIST SINCE    Past Surgical History:  Procedure Laterality Date  . APPENDECTOMY  1983  . CARDIOVASCULAR STRESS TEST  05-08-2010  DR WALL   NO EVIDENCE OF SCAR OR ISCHEMIA/ EF 54%/  PT HAD BOTH AFIB/ AFLUTTER DURING STUDY  . CARPAL TUNNEL RELEASE Right 01/ 14/ 2021  . CYSTOSCOPY W/ URETERAL STENT PLACEMENT Left 09/20/2013   Procedure: CYSTOSCOPY WITH STENT REPLACEMENT;  Surgeon: Antony HasteMatthew Ramsey Eskridge, MD;  [facility] [place];  Service: Urology;  Laterality: Left;  . CYSTOSCOPY WITH URETEROSCOPY Left 09/20/2013   Procedure: CYSTOSCOPY WITH URETEROSCOPY;  Surgeon: Antony HasteMatthew Ramsey Eskridge, MD;  [facility] [place];   Service: Urology;  Laterality: Left;  . CYSTOSCOPY WITH URETEROSCOPY AND STENT PLACEMENT Left 07/12/2013   Procedure: CYSTOSCOPY WITH LITHOLAPEXY, LEFT URETERAL STENT PLACEMENT;  Surgeon: Antony HasteMatthew Ramsey Eskridge, MD;  [facility] [place];  Service: Urology;  Laterality: Left;  . CYSTOSCOPY WITH URETEROSCOPY AND STENT PLACEMENT Bilateral 06/23/2014   Procedure: BILATERAL URETEROSCOPY, HOLMIUM LASER LITHOTRIPSY AND STENT PLACEMENT, RIGHT ;  Surgeon: Jerilee FieldMatthew Eskridge, MD;  [facility] [place];  Service: Urology;  Laterality: Bilateral;  . EXTRACORPOREAL SHOCK WAVE LITHOTRIPSY  X2  . EXTRACORPOREAL SHOCK WAVE LITHOTRIPSY Left 08-29-2013;   07-28-2013  . EXTRACORPOREAL SHOCK WAVE LITHOTRIPSY Right 12/09/2018   Procedure: EXTRACORPOREAL SHOCK WAVE LITHOTRIPSY (ESWL);  Surgeon: Jerilee FieldEskridge, Matthew, MD;  Location: [facility];  Service: Urology;  Laterality: Right;  . HOLMIUM LASER APPLICATION Left 09/20/2013   Procedure: HOLMIUM LASER APPLICATION;  Surgeon: Antony HasteMatthew Ramsey Eskridge, MD;  [facility] [place];  Service: Urology;  Laterality: Left;  . INGUINAL HERNIA REPAIR Bilateral 10/16/2020   Procedure: BILATERAL OPEN INGUINAL HERNIA REPAIR WITH MESH;  Surgeon: Abigail MiyamotoBlackman, [place], MD;  [facility] [place];  Service: General;  Laterality: Bilateral;  LMA/TAP BLOCK  . LAPAROSCOPIC INGUINAL HERNIA REPAIR Bilateral 09-04-2010   w/ mesh  . TONSILLECTOMY  AS CHILD  . 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 TOTAL HIP ARTHROPLASTY  Right 11/15/2012   Procedure: RIGHT TOTAL HIP ARTHROPLASTY ANTERIOR APPROACH;  Surgeon: Eldred Manges, MD;  [facility];  Service: Orthopedics;  Laterality: Right;  Right total hip arthroplasty  . TRANSTHORACIC ECHOCARDIOGRAM  05-08-2010   NORMAL LVSF/  EF 55%/  GRADE I DIASTOLIC DYSFUNCTION/  MILD BILATERAL ATRIUM ENLARGEMENT    There were no vitals filed for this visit.   Subjective Assessment - 12/17/20 1321     Subjective Doing well since last PT session. Reports he was sent a cardiac monitor from cardiologist and is to have it on for 14 days    Patient is accompained by: Family member    Pertinent History Hx of gout with flareups to the right foot, OA, A Fib, GERD, B hip replacements, carpal tunnel last january.    How long can you sit comfortably? Gets restless when sitting for long time.    Patient Stated Goals to get back to PLOF, get stronger    Currently in Pain? No/denies                             [facility] Adult PT Treatment/Exercise - 12/17/20 0001      Ambulation/Gait   Ambulation Surface Level;Unlevel;Indoor;Outdoor;Paved;Grass    Stairs Yes    Stair Management Technique Alternating pattern;No rails    Number of Stairs 12    Height of Stairs 6    Curb 7: Independent      Exercises   Exercises Knee/Hip      Knee/Hip Exercises: Aerobic   Nustep L4 x 5 min      Knee/Hip Exercises: Standing   Lateral Step Up Both;2 sets;5 reps   6"   Step Down Both;2 sets;5 reps   first set 4", 2nd set 6"   Other Standing Knee Exercises backwards alternaitng steps 10 x 2 , standing hip ABD x 10 B, Standing SLS balance 30" B (easier on L vs R), standing marches 10 x 2, toe raises 10 x 2                 PT Education - 12/17/20 1403    Education Details Updated HEP provided: [id number].    Person(s) Educated Patient    Methods Explanation;Demonstration;Handout    Comprehension Verbalized understanding;Returned demonstration            PT Short Term Goals - 12/17/20 1324      PT SHORT TERM GOAL #1   Title Pt will be independent with initial HEP    Time 2    Period Weeks    Status Achieved    Target Date 11/12/20             PT Long Term Goals - 12/17/20 1324      PT LONG TERM GOAL #1   Title Pt will be able to complete gait with vertical and horizontal head turns without LOB and with minimal to no gait deviations to facilitate safe negotiation of busy  community environments.    Time 8    Period Weeks    Status Achieved      PT LONG TERM GOAL #2   Title Pt will complete 5TSTS in no greater than 12 seconds to demo improved BLE strength and funcitonal balance.    Time 8    Period Weeks    Status Achieved   12/12/20: 10 seconds     PT LONG TERM GOAL #3   Title Pt will  be able to negotiate standard height steps safely with alternating pattern and no HR without LOB.    Time 8    Period Weeks    Status On-going   12/12/20: able to complete 12 - 6" steps with 1 small LOB when taking 11th step but able to recover.     PT LONG TERM GOAL #4   Title Pt will be able to hold MCTSIB condition #4 balance with feet together and EC on foam for at least 20 seconds with minimal sway and no LOB    Time 8    Period Weeks    Status On-going   3/16: able to hold 20 seconds with mod sway                Plan - 12/17/20 1323    Clinical Impression Statement [name] Short is making excellent progress toward goals. Reassessed stairs again today with no LOB. Incorporated more uneven surface dynamic exercises with good balance. Assessed outdoor negotiation of curbs, slight incline, uneven surfeces (grass vs concrete/pavement) and he did very nicely with his overall stability. End of session he noted some concern regarding return to higher level tennis playing with running as that was his primary form of exercise before stroke. Plan for next session to reassess updated HEP and further assess safety with higher speed movements such as jog/run on level surfaces to determine appropriateness to return to activity.    Personal Factors and Comorbidities Age;Fitness;Time since onset of injury/illness/exacerbation;Comorbidity 3+    Comorbidities Hx of gout with recent flareups to the right foot, OA, A Fib, GERD, B hip replacements, carpal tunnel last january.    Examination-Activity Limitations Locomotion Level;Stairs;Lift    Examination-Participation Restrictions Community  Activity;Interpersonal Relationship    Rehab Potential Good    PT Frequency 1x / week    PT Duration 8 weeks    PT Treatment/Interventions ADLs/Self Care Home Management;Biofeedback;Electrical Stimulation;Cryotherapy;Moist Heat;Neuromuscular re-education;Balance training;Therapeutic exercise;Therapeutic activities;Functional mobility training;Stair training;Gait training;Patient/family education;Manual techniques;Energy conservation;Passive range of motion;Taping;Vasopneumatic Device    PT Next Visit Plan Recert due next visit. Take baseline VS pre activity and as needed. reassess updated HEP and assess balance safety with higher speed movements, assess safety with prerequisite motions for return to tennis/fitness/exercise activities (jog/run on level surface, side stepping/shuffles at incr speed, lunging positions with rotational trunk mvmts).    PT Home Exercise Plan see pt edu    Consulted and Agree with Plan of Care Patient           Patient will benefit from skilled therapeutic intervention in order to improve the following deficits and impairments:  Abnormal gait,Decreased coordination,Decreased endurance,Decreased activity tolerance,Pain,Improper body mechanics,Impaired flexibility,Decreased balance,Decreased mobility,Decreased strength,Postural dysfunction  Visit Diagnosis: Hemiplegia and hemiparesis following cerebral infarction affecting left non-dominant side (HCC)  Other lack of coordination  Muscle weakness (generalized)  Unsteadiness on feet     Problem List Patient Active Problem List   Diagnosis Date Noted  . CVA (cerebral vascular accident) (HCC) 10/21/2020  . Bilateral primary osteoarthritis of knee 02/15/2020  . Acquired trigger finger of right little finger 11/03/2019  . Encounter for orthopedic follow-up care 10/13/2019  . H/O total hip arthroplasty, bilateral 10/24/2018  . Gout 08/18/2017  . Overweight (BMI 25.0-29.9) 01/23/2017  . Foot pain, left 02/11/2016   . Sebaceous cyst 07/14/2014  . Sun-damaged skin 07/14/2014  . 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/14/2014  . Physical exam, annual 05/17/2012  . Inguinal hernia 08/12/2010  . 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 TOBACCO USER  05/01/2010  . Paroxysmal a-fib (HCC) 05/01/2010  . ABNORMAL ELECTROCARDIOGRAM 03/26/2010    Anson Crofts, PT, DPT 12/17/2020, 2:17 PM  [facility] [address] [place], [place], [address] Phone: [phone]   Fax:  [phone]  Name: [name] Short MRN: [id number] Date of Birth: 1945/12/08

## 2020-12-18 NOTE — Therapy (Signed)
[facility] [address] [place], [place], [address] Phone: [phone]   Fax:  [phone]  Occupational Therapy Treatment  Patient Details  Name: [name] Short MRN: [id number] Date of Birth: Feb 01, 1946 Referring Provider (OT): Zannie Cove, MD   Encounter Date: 12/17/2020   OT End of Session - 12/17/20 1402    Visit Number 8    Number of Visits 17    Date for OT Re-Evaluation 12/28/20    Authorization Type Medicare Part A and B    Progress Note Due on Visit 10    OT Start Time 1400    OT Stop Time 1445    OT Time Calculation (min) 45 min    Activity Tolerance Patient tolerated treatment well    Behavior During Therapy [facility] for tasks assessed/performed           Past Medical History:  Diagnosis Date  . Arthritis    WRIST  . Benign localized prostatic hyperplasia with lower urinary tract symptoms (LUTS)   . Complication of anesthesia    slow to wake  . ED (erectile dysfunction)   . GERD (gastroesophageal reflux disease)   . History of concussion    AS CHILD--  NO RESIDUAL  . History of gout   . History of kidney stones   . Hyperlipidemia   . Inguinal hernia, bilateral   . Nephrolithiasis    BILATERAL  . PAF (paroxysmal atrial fibrillation) (HCC) currently being followed by pcp   EPISODE --  2011  FOLLOW-UP W/ DR WALL  /  HAS NOT SEEN ANY CARDIOLOGIST SINCE    Past Surgical History:  Procedure Laterality Date  . APPENDECTOMY  1983  . CARDIOVASCULAR STRESS TEST  05-08-2010  DR WALL   NO EVIDENCE OF SCAR OR ISCHEMIA/ EF 54%/  PT HAD BOTH AFIB/ AFLUTTER DURING STUDY  . CARPAL TUNNEL RELEASE Right 01/ 14/ 2021  . CYSTOSCOPY W/ URETERAL STENT PLACEMENT Left 09/20/2013   Procedure: CYSTOSCOPY WITH STENT REPLACEMENT;  Surgeon: Antony Haste, MD;  Location: [facility];  Service: Urology;  Laterality: Left;  . CYSTOSCOPY WITH URETEROSCOPY Left 09/20/2013   Procedure: CYSTOSCOPY WITH  URETEROSCOPY;  Surgeon: Antony Haste, MD;  Location: [facility];  Service: Urology;  Laterality: Left;  . CYSTOSCOPY WITH URETEROSCOPY AND STENT PLACEMENT Left 07/12/2013   Procedure: CYSTOSCOPY WITH LITHOLAPEXY, LEFT URETERAL STENT PLACEMENT;  Surgeon: Antony Haste, MD;  [facility];  Service: Urology;  Laterality: Left;  . CYSTOSCOPY WITH URETEROSCOPY AND STENT PLACEMENT Bilateral 06/23/2014   Procedure: BILATERAL URETEROSCOPY, HOLMIUM LASER LITHOTRIPSY AND STENT PLACEMENT, RIGHT ;  Surgeon: Jerilee Field, MD;  Location[facility];  Service: Urology;  Laterality: Bilateral;  . EXTRACORPOREAL SHOCK WAVE LITHOTRIPSY  X2  . EXTRACORPOREAL SHOCK WAVE LITHOTRIPSY Left 08-29-2013;   07-28-2013  . EXTRACORPOREAL SHOCK WAVE LITHOTRIPSY Right 12/09/2018   Procedure: EXTRACORPOREAL SHOCK WAVE LITHOTRIPSY (ESWL);  Surgeon: Jerilee Field, MD;  Location: [facility];  Service: Urology;  Laterality: Right;  . HOLMIUM LASER APPLICATION Left 09/20/2013   Procedure: HOLMIUM LASER APPLICATION;  Surgeon: Antony Haste, MD;  Location[facility];  Service: Urology;  Laterality: Left;  . INGUINAL HERNIA REPAIR Bilateral 10/16/2020   Procedure: BILATERAL OPEN INGUINAL HERNIA REPAIR WITH MESH;  Surgeon: Abigail Miyamoto, MD;  [facility];  Service: General;  Laterality: Bilateral;  LMA/TAP BLOCK  . LAPAROSCOPIC INGUINAL HERNIA REPAIR Bilateral 09-04-2010  w/ mesh  . TONSILLECTOMY  AS CHILD  . 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 TRANSTHORACIC ECHOCARDIOGRAM  05-08-2010   NORMAL LVSF/  EF 55%/  GRADE I DIASTOLIC DYSFUNCTION/  MILD BILATERAL ATRIUM ENLARGEMENT     There were no vitals filed for this visit.   Subjective Assessment - 12/17/20 1401    Subjective  Pt stated, "gripping this feels weird with my left hand" during simulated tennis activity    Pertinent History Carpal tunnel release of R hand (January 2021); PMHx of a-fib, gout, OA, and HLD    Patient Stated Goals Improve functional use of L hand; return to activities pt was participating in prior to onset    Currently in Pain? No/denies            OT Treatments/Exercises (OP) - 12/17/20 1813      ADLs   Leisure Simulated tennis activity w/ pt hitting a lightweight medium-sized ball w/ short dowel rod. Due to difficulty w/ hitting ball tossed by OT, pt instructed to simulate serving by tossing ball w/ one hand and hitting w/ the other and accuracy improved; completed on both sides approx 10x each      Visual/Perceptual Exercises   Visual Motor Integration Eye-hand coordination w/ ball toss activities involving pt tossing ball back and forth to OT and OT tossing ball for pt to volley back to OT (overhand and underhand)      Sensation Exercises   Sensory Retraining Searching for beads in bucket of dried beans w/ L hand; pt unable to be successful w/out visual input    Desensitization Education provided on desensitization strategies for pt to trial at home due to altered sensation in L index and long fingers            OT Short Term Goals - 12/12/20 1553      OT SHORT TERM GOAL #1   Title Pt will independently identify and demonstrate understanding of at least 3 compensatory/adaptive strategies, including AE, to be incorporated during BADLs    Baseline No currect compensatory/adaptive strategies    Time 4    Period Weeks    Status On-going    Target Date 11/30/20      OT SHORT TERM GOAL #2   Title Pt will improve functional FMC for manipulating clothing fasteners as evidenced by placing at least 50% of easy-grip pegs into pegboard independently    Baseline Unable to complete  9-Hole Peg Test due to decreased FMC/dexterity    Time 4    Period Weeks    Status Achieved   11/28/20 - able to place 100% of easy-grip pegs into pegboard independently     OT SHORT TERM GOAL #3   Title Pt will be able to don his socks and shoes, using AE prn, at least 75% of the time    Baseline Requires assist from wife w/ socks and shoes    Time 4    Period Weeks    Status Achieved   12/12/20 - pt reports no difficulty w/ donning/doffing socks or shoes and demonstrated ability to tie shoes w/ Mod I   Target Date 12/28/20            OT Long Term Goals - 12/17/20 1500  OT LONG TERM GOAL #1   Title Pt will be independent with HEP designed for strengthening and coordination of LUE and report carryover at home    Baseline No current HEP    Time 8    Period Weeks    Status On-going      OT LONG TERM GOAL #2   Title Pt will complete UB/LB dressing with Mod I    Baseline SPV/Safety; unable to manipulate fasteners    Time 8    Period Weeks    Status Achieved   12/17/20 - pt reports independence w/ dressing at home     OT LONG TERM GOAL #3   Title Pt will complete symmetrical/asymmetrical bilateral coordination activities with Mod I at least 75% of the time to improve participation in grooming tasks    Baseline Pt reports difficulty using left hand to stabilize during tasks required both hands    Time 8    Period Weeks    Status On-going      OT LONG TERM GOAL #4   Title Pt will improve functional LUE use to facilitate return to leisure activities as evidenced by increasing Box and Block Test by at least 10 blocks with L hand    Baseline Box and Block Test: R hand (46 blocks); L hand (29 blocks)    Time 8    Period Weeks    Status On-going            Plan - 12/17/20 1816    Clinical Impression Statement Pt continues to experience altered sensation in R index and long fingers; OT introduced more desensitization strategies (gentle massage introducted in prior session) and  texture hunting activity completed to facilitate sensory retraining of the digits. Pt also expressed desire to return to tennis, so OT facilitated simulated tennis activity to focus on bilateral coordination, GM control, and eye-hand coordination. Pt demo'd decreased response time, which improved with repetition; eye-hand coordination and coordination of LUE were limiting, but did also appear to improve w/ repetition and active grading up/down of activity.    OT Occupational Profile and History Detailed Assessment- Review of Records and additional review of physical, cognitive, psychosocial history related to current functional performance    Occupational performance deficits (Please refer to evaluation for details): ADL's;IADL's;Leisure    Body Structure / Function / Physical Skills ADL;ROM;UE functional use;FMC;Decreased knowledge of use of DME;Body mechanics;Dexterity;Sensation;GMC;Strength;Pain;Coordination;IADL;Cardiopulmonary status limiting activity;Vision    Rehab Potential Good    Clinical Decision Making Several treatment options, min-mod task modification necessary    Comorbidities Affecting Occupational Performance: Presence of comorbidities impacting occupational performance    Modification or Assistance to Complete Evaluation  Min-Moderate modification of tasks or assist with assess necessary to complete eval    OT Frequency 2x / week    OT Duration 8 weeks    OT Treatment/Interventions Self-care/ADL training;Electrical Stimulation;Therapeutic exercise;Visual/perceptual remediation/compensation;Moist Heat;Neuromuscular education;Patient/family education;Energy conservation;Fluidtherapy;Therapeutic activities;Cryotherapy;Paraffin;DME and/or AE instruction;Contrast Bath;Manual Therapy;Passive range of motion;Cognitive remediation/compensation    Plan GM control and bilateral coordination    Consulted and Agree with Plan of Care Patient;Family member/caregiver    Family Member Consulted Wife            Patient will benefit from skilled therapeutic intervention in order to improve the following deficits and impairments:   Body Structure / Function / Physical Skills: ADL,ROM,UE functional use,FMC,Decreased knowledge of use of DME,Body mechanics,Dexterity,Sensation,GMC,Strength,Pain,Coordination,IADL,Cardiopulmonary status limiting activity,Vision       Visit Diagnosis: Hemiplegia and hemiparesis following cerebral infarction affecting left  non-dominant side (HCC)  Other lack of coordination  Muscle weakness (generalized)  Paresthesia of skin  Other symptoms and signs involving the nervous system    Problem List Patient Active Problem List   Diagnosis Date Noted  . CVA (cerebral vascular accident) (HCC) 10/21/2020  . Bilateral primary osteoarthritis of knee 02/15/2020  . Acquired trigger finger of right little finger 11/03/2019  . Encounter for orthopedic follow-up care 10/13/2019  . H/O total hip arthroplasty, bilateral 10/24/2018  . Gout 08/18/2017  . Overweight (BMI 25.0-29.9) 01/23/2017  . Foot pain, left 02/11/2016  . Sebaceous cyst 07/14/2014  . Sun-damaged skin 07/14/2014  . 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/14/2014  . Physical exam, annual 05/17/2012  . Inguinal hernia 08/12/2010  . 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 TOBACCO USER 05/01/2010  . Paroxysmal a-fib (HCC) 05/01/2010  . ABNORMAL ELECTROCARDIOGRAM 03/26/2010     Rosie FateKelsey Lockie Bothun, OTR/L, MSOT 12/18/2020, 8:46 AM  [facility] [address] [place], [place], [address] Phone: [phone]   Fax:  [phone]  Name: Dyanne IhaRichard I Avallone MRN: [id number] Date of Birth: Jul 03, 1946

## 2020-12-24 ENCOUNTER — Other Ambulatory Visit: Payer: Self-pay

## 2020-12-24 ENCOUNTER — Ambulatory Visit: Payer: Medicare Other

## 2020-12-24 ENCOUNTER — Ambulatory Visit: Payer: Medicare Other | Admitting: Occupational Therapy

## 2020-12-24 ENCOUNTER — Encounter: Payer: Self-pay | Admitting: Occupational Therapy

## 2020-12-24 DIAGNOSIS — R202 Paresthesia of skin: Secondary | ICD-10-CM

## 2020-12-24 DIAGNOSIS — R2681 Unsteadiness on feet: Secondary | ICD-10-CM

## 2020-12-24 DIAGNOSIS — R29818 Other symptoms and signs involving the nervous system: Secondary | ICD-10-CM

## 2020-12-24 DIAGNOSIS — I69354 Hemiplegia and hemiparesis following cerebral infarction affecting left non-dominant side: Secondary | ICD-10-CM

## 2020-12-24 DIAGNOSIS — R278 Other lack of coordination: Secondary | ICD-10-CM

## 2020-12-24 DIAGNOSIS — M6281 Muscle weakness (generalized): Secondary | ICD-10-CM

## 2020-12-24 NOTE — Therapy (Signed)
[place] [place], [place], [address] Phone: [phone]   Fax:  [phone]  Physical Therapy Treatment & Discharge Summary  Patient Details  Name: [name] Short MRN: [id number] Date of Birth: 1945/11/07 Referring Provider (PT): Orlean Patten Date: 12/24/2020   PT End of Session - 12/24/20 1403    Visit Number 8    Number of Visits 9    Date for PT Re-Evaluation 12/24/20    PT Start Time 1400    PT Stop Time 1445    PT Time Calculation (min) 45 min    Equipment Utilized During Treatment Gait belt    Activity Tolerance Patient tolerated treatment well    Behavior During Therapy [facility] for tasks assessed/performed           Past Medical History:  Diagnosis Date  . Arthritis    WRIST  . Benign localized prostatic hyperplasia with lower urinary tract symptoms (LUTS)   . Complication of anesthesia    slow to wake  . ED (erectile dysfunction)   . GERD (gastroesophageal reflux disease)   . History of concussion    AS CHILD--  NO RESIDUAL  . History of gout   . History of kidney stones   . Hyperlipidemia   . Inguinal hernia, bilateral   . Nephrolithiasis    BILATERAL  . PAF (paroxysmal atrial fibrillation) ([place]) currently being followed by pcp   EPISODE --  2011  FOLLOW-UP W/ DR WALL  /  HAS NOT SEEN ANY CARDIOLOGIST SINCE    Past Surgical History:  Procedure Laterality Date  . APPENDECTOMY  1983  . CARDIOVASCULAR STRESS TEST  05-08-2010  DR WALL   NO EVIDENCE OF SCAR OR ISCHEMIA/ EF 54%/  PT HAD BOTH AFIB/ AFLUTTER DURING STUDY  . CARPAL TUNNEL RELEASE Right 01/ 14/ 2021  . CYSTOSCOPY W/ URETERAL STENT PLACEMENT Left 09/20/2013   Procedure: CYSTOSCOPY WITH STENT REPLACEMENT;  Surgeon: Fredricka Bonine, MD;  Location: [facility];  Service: Urology;  Laterality: Left;  . CYSTOSCOPY WITH URETEROSCOPY Left 09/20/2013   Procedure: CYSTOSCOPY WITH URETEROSCOPY;  Surgeon:  Fredricka Bonine, MD;  Location: [facility];  Service: Urology;  Laterality: Left;  . CYSTOSCOPY WITH URETEROSCOPY AND STENT PLACEMENT Left 07/12/2013   Procedure: CYSTOSCOPY WITH LITHOLAPEXY, LEFT URETERAL STENT PLACEMENT;  Surgeon: Fredricka Bonine, MD;  Location: [facility];  Service: Urology;  Laterality: Left;  . CYSTOSCOPY WITH URETEROSCOPY AND STENT PLACEMENT Bilateral 06/23/2014   Procedure: BILATERAL URETEROSCOPY, HOLMIUM LASER LITHOTRIPSY AND STENT PLACEMENT, RIGHT ;  Surgeon: Festus Aloe, MD;  Location: [facility];  Service: Urology;  Laterality: Bilateral;  . EXTRACORPOREAL SHOCK WAVE LITHOTRIPSY  X2  . EXTRACORPOREAL SHOCK WAVE LITHOTRIPSY Left 08-29-2013;   07-28-2013  . EXTRACORPOREAL SHOCK WAVE LITHOTRIPSY Right 12/09/2018   Procedure: EXTRACORPOREAL SHOCK WAVE LITHOTRIPSY (ESWL);  Surgeon: Festus Aloe, MD;  Location: [facility];  Service: Urology;  Laterality: Right;  . HOLMIUM LASER APPLICATION Left 60/06/9322   Procedure: HOLMIUM LASER APPLICATION;  Surgeon: Fredricka Bonine, MD;  Location: [facility];  Service: Urology;  Laterality: Left;  . INGUINAL HERNIA REPAIR Bilateral 10/16/2020   Procedure: BILATERAL OPEN INGUINAL HERNIA REPAIR WITH MESH;  Surgeon: Coralie Keens, MD;  Location: [place];  Service: General;  Laterality: Bilateral;  LMA/TAP BLOCK  . LAPAROSCOPIC INGUINAL HERNIA REPAIR Bilateral 09-04-2010   w/ mesh  . TONSILLECTOMY  AS CHILD  .  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 TOTAL HIP ARTHROPLASTY Right 11/15/2012   Procedure: RIGHT TOTAL HIP ARTHROPLASTY ANTERIOR APPROACH;  Surgeon: Marybelle Killings, MD;  Location: [place];  Service: Orthopedics;  Laterality: Right;  Right total hip arthroplasty  . TRANSTHORACIC ECHOCARDIOGRAM  05-08-2010   NORMAL LVSF/  EF 66%/  GRADE I DIASTOLIC DYSFUNCTION/  MILD BILATERAL ATRIUM ENLARGEMENT    There were no vitals  filed for this visit.   Subjective Assessment - 12/24/20 1402    Subjective Some back pain but otherwise doing well.    Pertinent History Hx of gout with flareups to the right foot, OA, A Fib, GERD, B hip replacements, carpal tunnel last january.    How long can you sit comfortably? Gets restless when sitting for long time.    Patient Stated Goals to get back to PLOF, get stronger    Currently in Pain? No/denies            [facility] Adult PT Treatment/Exercise - 12/24/20 0001      High Level Balance   High Level Balance Activities Side stepping;Direction changes;Head turns;Negotiating over obstacles    High Level Balance Comments quick forward lunges, sideways lunges, incr speed sidestepping B, forward and backwards stepping. wide lunges to colored cones working on anticipatory/reactive balance at increased speeds witout LOB      Knee/Hip Exercises: Aerobic   Nustep L4 x 5 min              PT Education - 12/24/20 1444    Education Details Reinforced updated HEP, educated on safe progression for tennis    Person(s) Educated Patient    Methods Explanation;Demonstration;Handout    Comprehension Verbalized understanding;Returned demonstration            PT Short Term Goals - 12/17/20 1324      PT SHORT TERM GOAL #1   Title Pt will be independent with initial HEP    Time 2    Period Weeks    Status Achieved    Target Date 11/12/20             PT Long Term Goals - 12/24/20 1442      PT LONG TERM GOAL #1   Title Pt will be able to complete gait with vertical and horizontal head turns without LOB and with minimal to no gait deviations to facilitate safe negotiation of busy community environments.    Time 8    Period Weeks    Status Achieved      PT LONG TERM GOAL #2   Title Pt will complete 5TSTS in no greater than 12 seconds to demo improved BLE strength and funcitonal balance.    Time 8    Period Weeks    Status Achieved   12/12/20: 10 seconds     PT LONG TERM  GOAL #3   Title Pt will be able to negotiate standard height steps safely with alternating pattern and no HR without LOB.    Time 8    Period Weeks    Status Achieved   12/12/20: able to complete 12 - 6" steps with 1 small LOB when taking 11th step but able to recover.     PT LONG TERM GOAL #4   Title Pt will be able to hold MCTSIB condition #4 balance with feet together and EC on foam for at least 20 seconds with minimal sway and no LOB    Time 8    Period Weeks  Status Achieved   3/16: able to hold 20 seconds with mod sway                Plan - 12/24/20 1408    Clinical Impression Statement Evens is making excellent progress toward goals. Today spent on more higher level balance, speed/agility activities. He did well with picking up speed for multidirecitonal balance drills, no LOB noted throughout. Although a bit slower with lateral stepping, he demosntrated good overall stability even with increasing speed. He demonstrated good overall reactive balance with cone drills as well.  He reports he is satisfied with his progress and he is appropriate for discharge from skilled PT to HEP at this time. Educated on continuation of updated HEP for home as well as some simple agility/speed in different directions when exercising in preparation for return to tennis once weather improves. Pt agreeable to discharge plan at this time    Personal Factors and Comorbidities Age;Fitness;Time since onset of injury/illness/exacerbation;Comorbidity 3+    Comorbidities Hx of gout with recent flareups to the right foot, OA, A Fib, GERD, B hip replacements, carpal tunnel last january.    Examination-Activity Limitations --    Examination-Participation Restrictions --    Rehab Potential Good    PT Treatment/Interventions ADLs/Self Care Home Management;Biofeedback;Electrical Stimulation;Cryotherapy;Moist Heat;Neuromuscular re-education;Balance training;Therapeutic exercise;Therapeutic activities;Functional  mobility training;Stair training;Gait training;Patient/family education;Manual techniques;Energy conservation;Passive range of motion;Taping;Vasopneumatic Device    PT Next Visit Plan Discharge to independent HEP at this time    Consulted and Agree with Plan of Care Patient           Patient will benefit from skilled therapeutic intervention in order to improve the following deficits and impairments:     Visit Diagnosis: Other lack of coordination  Muscle weakness (generalized)  Unsteadiness on feet  Hemiplegia and hemiparesis following cerebral infarction affecting left non-dominant side [facility])     Problem List Patient Active Problem List   Diagnosis Date Noted  . CVA (cerebral vascular accident) ([place]) 10/21/2020  . Bilateral primary osteoarthritis of knee 02/15/2020  . Acquired trigger finger of right little finger 11/03/2019  . Encounter for orthopedic follow-up care 10/13/2019  . H/O total hip arthroplasty, bilateral 10/24/2018  . Gout 08/18/2017  . Overweight (BMI 25.0-29.9) 01/23/2017  . Foot pain, left 02/11/2016  . Sebaceous cyst 07/14/2014  . Sun-damaged skin 07/14/2014  . 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/14/2014  . Physical exam, annual 05/17/2012  . Inguinal hernia 08/12/2010  . 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 TOBACCO USER 05/01/2010  . Paroxysmal a-fib ([place]) 05/01/2010  . ABNORMAL ELECTROCARDIOGRAM 03/26/2010   PHYSICAL THERAPY DISCHARGE SUMMARY  Visits from Start of Care: 8   Plan: Patient agrees to discharge.  Patient goals were met. Patient is being discharged due to being pleased with the current functional level.  ?????        Hall Busing , PT, DPT 12/24/2020, 2:45 PM  [address] [place], [place], [address] Phone: [phone]   Fax:  [phone]  Name: JUVENTINO PAVONE MRN: [id number] Date of Birth: 1945/11/19

## 2020-12-24 NOTE — Patient Instructions (Signed)
Access Code: [email or web address] URL: [email or web address] Date: 12/24/2020 Prepared by: Adelina Mings, OTR/L  Exercises Finger Pinch and Pull with Putty - 1 sets - 5 reps Finger Abduction with Putty - 1 sets - 5 reps Finger Adduction with Putty - 1 sets - 5 reps

## 2020-12-25 NOTE — Therapy (Signed)
[facility] [address] [place], [place], [address] Phone: [phone]   Fax:  [phone]  Occupational Therapy Treatment  Patient Details  Name: [name] Short MRN: [id number] Date of Birth: 10-25-1945 Referring Provider (OT): Zannie Cove, MD   Encounter Date: 12/24/2020   OT End of Session - 12/24/20 1324    Visit Number 9    Number of Visits 17    Date for OT Re-Evaluation 12/28/20    Authorization Type Medicare Part A and B    Progress Note Due on Visit 10    OT Start Time 1320   Pt arrived 5 min late   OT Stop Time 1400    OT Time Calculation (min) 40 min    Activity Tolerance Patient tolerated treatment well    Behavior During Therapy [facility] for tasks assessed/performed           Past Medical History:  Diagnosis Date  . Arthritis    WRIST  . Benign localized prostatic hyperplasia with lower urinary tract symptoms (LUTS)   . Complication of anesthesia    slow to wake  . ED (erectile dysfunction)   . GERD (gastroesophageal reflux disease)   . History of concussion    AS CHILD--  NO RESIDUAL  . History of gout   . History of kidney stones   . Hyperlipidemia   . Inguinal hernia, bilateral   . Nephrolithiasis    BILATERAL  . PAF (paroxysmal atrial fibrillation) ([facility]) currently being followed by pcp   EPISODE --  2011  FOLLOW-UP W/ DR WALL  /  HAS NOT SEEN ANY CARDIOLOGIST SINCE    Past Surgical History:  Procedure Laterality Date  . APPENDECTOMY  1983  . CARDIOVASCULAR STRESS TEST  05-08-2010  DR WALL   NO EVIDENCE OF SCAR OR ISCHEMIA/ EF 54%/  PT HAD BOTH AFIB/ AFLUTTER DURING STUDY  . CARPAL TUNNEL RELEASE Right 01/ 14/ 2021  . CYSTOSCOPY W/ URETERAL STENT PLACEMENT Left 09/20/2013   Procedure: CYSTOSCOPY WITH STENT REPLACEMENT;  Surgeon: Antony Haste, MD;  Location: [facility];  Service: Urology;  Laterality: Left;  . CYSTOSCOPY WITH URETEROSCOPY Left 09/20/2013    Procedure: CYSTOSCOPY WITH URETEROSCOPY;  Surgeon: Antony Haste, MD;  Location: [facility];  Service: Urology;  Laterality: Left;  . CYSTOSCOPY WITH URETEROSCOPY AND STENT PLACEMENT Left 07/12/2013   Procedure: CYSTOSCOPY WITH LITHOLAPEXY, LEFT URETERAL STENT PLACEMENT;  Surgeon: Antony Haste, MD;  Location: [facility];  Service: Urology;  Laterality: Left;  . CYSTOSCOPY WITH URETEROSCOPY AND STENT PLACEMENT Bilateral 06/23/2014   Procedure: BILATERAL URETEROSCOPY, HOLMIUM LASER LITHOTRIPSY AND STENT PLACEMENT, RIGHT ;  Surgeon: Jerilee Field, MD;  Location: [facility];  Service: Urology;  Laterality: Bilateral;  . EXTRACORPOREAL SHOCK WAVE LITHOTRIPSY  X2  . EXTRACORPOREAL SHOCK WAVE LITHOTRIPSY Left 08-29-2013;   07-28-2013  . EXTRACORPOREAL SHOCK WAVE LITHOTRIPSY Right 12/09/2018   Procedure: EXTRACORPOREAL SHOCK WAVE LITHOTRIPSY (ESWL);  Surgeon: Jerilee Field, MD;  Location[facility];  Service: Urology;  Laterality: Right;  . HOLMIUM LASER APPLICATION Left 09/20/2013   Procedure: HOLMIUM LASER APPLICATION;  Surgeon: Antony Haste, MD;  Location: [facility];  Service: Urology;  Laterality: Left;  . INGUINAL HERNIA REPAIR Bilateral 10/16/2020   Procedure: BILATERAL OPEN INGUINAL HERNIA REPAIR WITH MESH;  Surgeon: Abigail Miyamoto, MD;  Location: [facility];  Service: General;  Laterality: Bilateral;  LMA/TAP BLOCK  . LAPAROSCOPIC  INGUINAL HERNIA REPAIR Bilateral 09-04-2010   w/ mesh  . TONSILLECTOMY  AS CHILD  . 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 TRANSTHORACIC ECHOCARDIOGRAM  05-08-2010   NORMAL LVSF/  EF 55%/  GRADE I DIASTOLIC DYSFUNCTION/  MILD  BILATERAL ATRIUM ENLARGEMENT    There were no vitals filed for this visit.   Subjective Assessment - 12/24/20 1322    Subjective  Pt reports he has been having a little back pain earlier today (2/10) that he has had on-and-off for a while now; "some days I don't have it at all"    Pertinent History Carpal tunnel release of R hand (January 2021); PMHx of a-fib, gout, OA, and HLD    Patient Stated Goals Improve functional use of L hand; return to activities pt was participating in prior to onset    Currently in Pain? No/denies            OT Treatments/Exercises (OP) - 12/24/20 1400      ADLs   Leisure Tennis activity completed to facilitate improved GMC, eye-hand coordination, perception, and reponse time; pt completed serves w/ R and L hand as well as backhand hit on L side      Neurological Re-education Exercises   Other Exercises 1 Pt completed L finger abduction, adduction, and pinch-and-pull exercises w/ theraputty isolating index and long fingers to facilitate strengthing, sensory retraining, and neuromuscular re-ed to improve functional use of the digits during daily activities             OT Short Term Goals - 12/12/20 1553      OT SHORT TERM GOAL #1   Title Pt will independently identify and demonstrate understanding of at least 3 compensatory/adaptive strategies, including AE, to be incorporated during BADLs    Baseline No currect compensatory/adaptive strategies    Time 4    Period Weeks    Status On-going    Target Date 11/30/20      OT SHORT TERM GOAL #2   Title Pt will improve functional FMC for manipulating clothing fasteners as evidenced by placing at least 50% of easy-grip pegs into pegboard independently    Baseline Unable to complete 9-Hole Peg Test due to decreased FMC/dexterity    Time 4    Period Weeks    Status Achieved   11/28/20 - able to place 100% of easy-grip pegs into pegboard independently     OT SHORT TERM GOAL #3   Title Pt will be able to don  his socks and shoes, using AE prn, at least 75% of the time    Baseline Requires assist from wife w/ socks and shoes    Time 4    Period Weeks    Status Achieved   12/12/20 - pt reports no difficulty w/ donning/doffing socks or shoes and demonstrated ability to tie shoes w/ Mod I   Target Date 12/28/20             OT Long Term Goals - 12/24/20 1400      OT LONG TERM GOAL #1   Title Pt will be independent with HEP designed for strengthening and coordination of LUE and report carryover at home    Baseline No current HEP    Time 8    Period Weeks  Status On-going      OT LONG TERM GOAL #2   Title Pt will complete UB/LB dressing with Mod I    Baseline SPV/Safety; unable to manipulate fasteners    Time 8    Period Weeks    Status Achieved   12/17/20 - pt reports independence w/ dressing at home     OT LONG TERM GOAL #3   Title Pt will complete symmetrical/asymmetrical bilateral coordination activities with Mod I at least 75% of the time to improve participation in grooming tasks    Baseline Pt reports difficulty using left hand to stabilize during tasks required both hands    Time 8    Period Weeks    Status Achieved   12/25/20 - able to complete bilateral coordination tasks independently w/out difficulty consistently     OT LONG TERM GOAL #4   Title Pt will improve functional LUE use to facilitate return to leisure activities as evidenced by increasing Box and Block Test by at least 10 blocks with L hand    Baseline Box and Block Test: R hand (46 blocks); L hand (29 blocks)    Time 8    Period Weeks    Status On-going             Plan - 12/24/20 1400    Clinical Impression Statement Pt cut his L thumb and was unable to complete desensitization strategies at home to facilitate sensory retraining; will continue to assess appropriateness of strategies. Pt demonstrated improved eye-hand coordination during tennis activity compared w/ simulated tennis activity completed in prior  session; due to decreased strength and response time of R index/long fingers, OT upgraded current HEP to include isolated finger strengthening w/ putty.    OT Occupational Profile and History Detailed Assessment- Review of Records and additional review of physical, cognitive, psychosocial history related to current functional performance    Occupational performance deficits (Please refer to evaluation for details): ADL's;IADL's;Leisure    Body Structure / Function / Physical Skills ADL;ROM;UE functional use;FMC;Decreased knowledge of use of DME;Body mechanics;Dexterity;Sensation;GMC;Strength;Pain;Coordination;IADL;Cardiopulmonary status limiting activity;Vision    Rehab Potential Good    Clinical Decision Making Several treatment options, min-mod task modification necessary    Comorbidities Affecting Occupational Performance: Presence of comorbidities impacting occupational performance    Modification or Assistance to Complete Evaluation  Min-Moderate modification of tasks or assist with assess necessary to complete eval    OT Frequency 2x / week    OT Duration 8 weeks    OT Treatment/Interventions Self-care/ADL training;Electrical Stimulation;Therapeutic exercise;Visual/perceptual remediation/compensation;Moist Heat;Neuromuscular education;Patient/family education;Energy conservation;Fluidtherapy;Therapeutic activities;Cryotherapy;Paraffin;DME and/or AE instruction;Contrast Bath;Manual Therapy;Passive range of motion;Cognitive remediation/compensation    Plan GM control and bilateral coordination    Consulted and Agree with Plan of Care Patient;Family member/caregiver    Family Member Consulted Wife           Patient will benefit from skilled therapeutic intervention in order to improve the following deficits and impairments:   Body Structure / Function / Physical Skills: ADL,ROM,UE functional use,FMC,Decreased knowledge of use of DME,Body  mechanics,Dexterity,Sensation,GMC,Strength,Pain,Coordination,IADL,Cardiopulmonary status limiting activity,Vision       Visit Diagnosis: Hemiplegia and hemiparesis following cerebral infarction affecting left non-dominant side ([facility])  Other lack of coordination  Muscle weakness (generalized)  Paresthesia of skin  Other symptoms and signs involving the nervous system    Problem List Patient Active Problem List   Diagnosis Date Noted  . CVA (cerebral vascular accident) ([facility]) 10/21/2020  . Bilateral primary osteoarthritis of knee 02/15/2020  . Acquired  trigger finger of right little finger 11/03/2019  . Encounter for orthopedic follow-up care 10/13/2019  . H/O total hip arthroplasty, bilateral 10/24/2018  . Gout 08/18/2017  . Overweight (BMI 25.0-29.9) 01/23/2017  . Foot pain, left 02/11/2016  . Sebaceous cyst 07/14/2014  . Sun-damaged skin 07/14/2014  . 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/14/2014  . Physical exam, annual 05/17/2012  . Inguinal hernia 08/12/2010  . 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 TOBACCO USER 05/01/2010  . Paroxysmal a-fib ([facility]) 05/01/2010  . ABNORMAL ELECTROCARDIOGRAM 03/26/2010     Rosie FateKelsey Trammell Bowden, OTR/L, MSOT 12/25/2020, 9:23 AM  [facility] [address] [place], [place], [id number] Phone: [phone]   Fax:  [phone]  Name: [name] Short MRN: [id number] Date of Birth: 01/02/1946

## 2020-12-31 ENCOUNTER — Other Ambulatory Visit: Payer: Self-pay

## 2020-12-31 ENCOUNTER — Ambulatory Visit: Payer: Medicare Other

## 2020-12-31 ENCOUNTER — Encounter: Payer: Self-pay | Admitting: Occupational Therapy

## 2020-12-31 ENCOUNTER — Ambulatory Visit: Payer: Medicare Other | Attending: Internal Medicine | Admitting: Occupational Therapy

## 2020-12-31 DIAGNOSIS — I69354 Hemiplegia and hemiparesis following cerebral infarction affecting left non-dominant side: Secondary | ICD-10-CM | POA: Insufficient documentation

## 2020-12-31 DIAGNOSIS — R29818 Other symptoms and signs involving the nervous system: Secondary | ICD-10-CM | POA: Insufficient documentation

## 2020-12-31 DIAGNOSIS — R278 Other lack of coordination: Secondary | ICD-10-CM | POA: Insufficient documentation

## 2020-12-31 DIAGNOSIS — R202 Paresthesia of skin: Secondary | ICD-10-CM | POA: Diagnosis not present

## 2020-12-31 DIAGNOSIS — M6281 Muscle weakness (generalized): Secondary | ICD-10-CM | POA: Diagnosis not present

## 2021-01-01 NOTE — Patient Instructions (Signed)
MP / PIP / DIP Composite Flexion (Passive Stretch)     Use other hand to bend index finger at all three joints. Hold __2-3__ seconds. Repeat with long finger. Do __~2__ sessions of __10__ repetitions with each finger per day.

## 2021-01-01 NOTE — Therapy (Signed)
[place]. [place], [place], [address] Phone: [phone]   Fax:  [phone]  Occupational Therapy Treatment & Progress Note  Patient Details  Name: [name] Short MRN: [id number] Date of Birth: 22-Oct-1945 Referring Provider (OT): Domenic Polite, MD   Encounter Date: 12/31/2020   OT End of Session - 12/31/20 1506    Visit Number 10    Number of Visits 17    Date for OT Re-Evaluation 01/30/21    Authorization Type Medicare Part A and B    Progress Note Due on Visit 10    OT Start Time 1448    OT Stop Time 1530    OT Time Calculation (min) 42 min    Equipment Utilized During Treatment 9-Hole Peg Test, Box and Blocks Test    Activity Tolerance Patient tolerated treatment well    Behavior During Therapy [employer] for tasks assessed/performed           Past Medical History:  Diagnosis Date  . Arthritis    WRIST  . Benign localized prostatic hyperplasia with lower urinary tract symptoms (LUTS)   . Complication of anesthesia    slow to wake  . ED (erectile dysfunction)   . GERD (gastroesophageal reflux disease)   . History of concussion    AS CHILD--  NO RESIDUAL  . History of gout   . History of kidney stones   . Hyperlipidemia   . Inguinal hernia, bilateral   . Nephrolithiasis    BILATERAL  . PAF (paroxysmal atrial fibrillation) ([place]) currently being followed by pcp   EPISODE --  2011  FOLLOW-UP W/ DR WALL  /  HAS NOT SEEN ANY CARDIOLOGIST SINCE    Past Surgical History:  Procedure Laterality Date  . APPENDECTOMY  1983  . CARDIOVASCULAR STRESS TEST  05-08-2010  DR WALL   NO EVIDENCE OF SCAR OR ISCHEMIA/ EF 54%/  PT HAD BOTH AFIB/ AFLUTTER DURING STUDY  . CARPAL TUNNEL RELEASE Right 01/ 14/ 2021  . CYSTOSCOPY W/ URETERAL STENT PLACEMENT Left 09/20/2013   Procedure: CYSTOSCOPY WITH STENT REPLACEMENT;  Surgeon: Fredricka Bonine, MD;  Location: [facility];  Service: Urology;   Laterality: Left;  . CYSTOSCOPY WITH URETEROSCOPY Left 09/20/2013   Procedure: CYSTOSCOPY WITH URETEROSCOPY;  Surgeon: Fredricka Bonine, MD;  Location: [facility];  Service: Urology;  Laterality: Left;  . CYSTOSCOPY WITH URETEROSCOPY AND STENT PLACEMENT Left 07/12/2013   Procedure: CYSTOSCOPY WITH LITHOLAPEXY, LEFT URETERAL STENT PLACEMENT;  Surgeon: Fredricka Bonine, MD;  Location: [facility];  Service: Urology;  Laterality: Left;  . CYSTOSCOPY WITH URETEROSCOPY AND STENT PLACEMENT Bilateral 06/23/2014   Procedure: BILATERAL URETEROSCOPY, HOLMIUM LASER LITHOTRIPSY AND STENT PLACEMENT, RIGHT ;  Surgeon: Festus Aloe, MD;  Location: [facility];  Service: Urology;  Laterality: Bilateral;  . EXTRACORPOREAL SHOCK WAVE LITHOTRIPSY  X2  . EXTRACORPOREAL SHOCK WAVE LITHOTRIPSY Left 08-29-2013;   07-28-2013  . EXTRACORPOREAL SHOCK WAVE LITHOTRIPSY Right 12/09/2018   Procedure: EXTRACORPOREAL SHOCK WAVE LITHOTRIPSY (ESWL);  Surgeon: Festus Aloe, MD;  Location: [facility];  Service: Urology;  Laterality: Right;  . HOLMIUM LASER APPLICATION Left 85/27/7824   Procedure: HOLMIUM LASER APPLICATION;  Surgeon: Fredricka Bonine, MD;  Location: [facility];  Service: Urology;  Laterality: Left;  . INGUINAL HERNIA REPAIR Bilateral 10/16/2020   Procedure: BILATERAL OPEN INGUINAL HERNIA REPAIR WITH MESH;  Surgeon: Coralie Keens, MD;  Location: [facility];  Service: General;  Laterality: Bilateral;  LMA/TAP BLOCK  . LAPAROSCOPIC INGUINAL HERNIA REPAIR Bilateral 09-04-2010   w/ mesh  . TONSILLECTOMY  AS CHILD  . 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 TOTAL HIP ARTHROPLASTY Right 11/15/2012   Procedure: RIGHT TOTAL HIP ARTHROPLASTY ANTERIOR APPROACH;  Surgeon: Marybelle Killings, MD;  Location: [facility];  Service: Orthopedics;  Laterality: Right;  Right total hip arthroplasty  . TRANSTHORACIC ECHOCARDIOGRAM  05-08-2010    NORMAL LVSF/  EF 66%/  GRADE I DIASTOLIC DYSFUNCTION/  MILD BILATERAL ATRIUM ENLARGEMENT    There were no vitals filed for this visit.   Subjective Assessment - 12/31/20 1459    Subjective  Pt reports after completing finger exercises last week his L index and long fingers felt really stiff and swollen   OT instructed pt to d/c exercises   Pertinent History Carpal tunnel release of R hand (January 2021); PMHx of a-fib, gout, OA, and HLD    Patient Stated Goals Improve functional use of L hand; return to activities pt was participating in prior to onset    Currently in Pain? No/denies            OT Treatments/Exercises (OP) - 12/31/20 1445      ADLs   Compensatory Strategies OT reviewed all compensatory strategies, w/ pt identifying each independently      Hand Exercises   Other Hand Exercises Due to pt's report that his hand was stiff and uncomfortable after completing strengthening exercises w/ putty, OT demonstrated gentle stretches of L index and long finger for pt to include in his HEP; pt returned demonstration and handout was provided to pt      Fine Motor Coordination (Hand/Wrist)   Manipulation of small objects Pt completed 9-Hole Peg Test and Box and Blocks Test to re-assess [facility] and L hand coordination; OT provided interpreation of results and significance of difference between current results and results recorded during pt's initial evaluation            OT Education - 12/31/20 1445    Education Details Education provided on pt's progress toward all goals and interpretation of objective measures    Person(s) Educated Patient    Methods Explanation    Comprehension Verbalized understanding            OT Short Term Goals - 12/31/20 1509      OT SHORT TERM GOAL #1   Title Pt will independently identify and demonstrate understanding of at least 3 compensatory/adaptive strategies, including AE, to be incorporated during BADLs    Baseline No currect  compensatory/adaptive strategies    Time 4    Period Weeks    Status Achieved   12/31/20 - independently demonstrates/verbalizes understanding of 4 compensatory strategies   Target Date 11/30/20      OT SHORT TERM GOAL #2   Title Pt will improve functional [place] for manipulating clothing fasteners as evidenced by placing at least 50% of easy-grip pegs into pegboard independently    Baseline Unable to complete 9-Hole Peg Test due to decreased FMC/dexterity    Time 4    Period Weeks    Status Achieved   11/28/20 - able to place 100% of easy-grip pegs into pegboard independently     OT SHORT TERM GOAL #3   Title Pt will be able to don his socks and shoes, using AE prn, at least 75% of the time    Baseline Requires assist from wife w/ socks and shoes  Time 4    Period Weeks    Status Achieved   12/12/20 - pt reports no difficulty w/ donning/doffing socks or shoes and demonstrated ability to tie shoes w/ Mod I   Target Date 12/28/20      OT SHORT TERM GOAL #4   Title Pt will increase grip and pinch strength of L hand to [facility] [place] [facility] to improve participation in tennis activity    Baseline L hand grip 40#; lateral pinch 10#; 3-point pinch 8#    Time 4    Period Weeks    Status New    Target Date 01/30/21             OT Long Term Goals - 12/31/20 1445      OT LONG TERM GOAL #1   Title Pt will be independent with HEP designed for strengthening and coordination of LUE and report carryover at home    Baseline No current HEP    Time 8    Period Weeks    Status Achieved   12/31/20 - pt consistently reports carryover of HEP     OT LONG TERM GOAL #2   Title Pt will complete UB/LB dressing with Mod I    Baseline SPV/Safety; unable to manipulate fasteners    Time 8    Period Weeks    Status Achieved   12/17/20 - pt reports independence w/ dressing at home     OT [place] #3   Title Pt will complete symmetrical/asymmetrical bilateral coordination activities with Mod I at least 75% of the time  to improve participation in grooming tasks    Baseline Pt reports difficulty using left hand to stabilize during tasks required both hands    Time 8    Period Weeks    Status Achieved   12/25/20 - able to complete bilateral coordination tasks independently w/out difficulty consistently     OT LONG TERM GOAL #4   Title Pt will improve functional LUE use to facilitate return to leisure activities as evidenced by increasing Box and Block Test by at least 10 blocks with L hand    Baseline Box and Block Test: R hand (46 blocks); L hand (29 blocks)    Time 8    Period Weeks    Status Achieved   12/31/20 - R hand (47 blocks); L hand (40 blocks)     OT LONG TERM GOAL #5   Title --    Time --    Period --    Status --    Target Date --            Plan - 12/31/20 1445    Clinical Impression Statement Pt is a 75 y.o. male who has been seen in OP OT due to CVA w/ resultant L-sided weakness that occurred 10/21/20. Tx has incorporated implementation of AE for ADLs, AROM, neuromuscular education, strengthening, compensation, and patient education. Pt has shown improvements in ADLs, understanding of compensatory strategies and AE, FMC, and LUE strength and coordination. Pt will benefit from continued skilled occupational therapy intervention to continue to increase hand strength and coordination, safety strategies, and higher level leisure participation.    OT Occupational Profile and History Detailed Assessment- Review of Records and additional review of physical, cognitive, psychosocial history related to current functional performance    Occupational performance deficits (Please refer to evaluation for details): ADL's;IADL's;Leisure    Body Structure / Function / Physical Skills ADL;ROM;UE functional use;FMC;Decreased knowledge of use of DME;Body mechanics;Dexterity;Sensation;GMC;Strength;Pain;Coordination;IADL;Cardiopulmonary  status limiting activity;Vision    Rehab Potential Good    Clinical Decision  Making Several treatment options, min-mod task modification necessary    Comorbidities Affecting Occupational Performance: Presence of comorbidities impacting occupational performance    Modification or Assistance to Complete Evaluation  Min-Moderate modification of tasks or assist with assess necessary to complete eval    OT Frequency 1x / week    OT Duration 4 weeks    OT Treatment/Interventions Self-care/ADL training;Electrical Stimulation;Therapeutic exercise;Visual/perceptual remediation/compensation;Moist Heat;Neuromuscular education;Patient/family education;Energy conservation;Fluidtherapy;Therapeutic activities;Cryotherapy;Paraffin;DME and/or AE instruction;Contrast Bath;Manual Therapy;Passive range of motion;Cognitive remediation/compensation    Plan Safety strategies for sensory impairment    Consulted and Agree with Plan of Care Patient;Family member/caregiver    Family Member Consulted Wife           Patient will benefit from skilled therapeutic intervention in order to improve the following deficits and impairments:   Body Structure / Function / Physical Skills: ADL,ROM,UE functional use,FMC,Decreased knowledge of use of DME,Body mechanics,Dexterity,Sensation,GMC,Strength,Pain,Coordination,IADL,Cardiopulmonary status limiting activity,Vision      Occupational Therapy Progress Note  Dates of Reporting Period: 10/29/20 to 12/31/20  Objective Reports of Subjective Statement: Pt continues to report stiffness and altered sensation of L index/long fingers.   Objective Measurements: Pt is able to independently identify appropriate compensatory strategies, manipulate both easy-grip and 9-HPT pegs into pegboard w/ L hand, dress completely w/ Mod I, and has improved Box and Blocks Test time w/ L hand by 11 blocks.  Goal Update: Pt has met 4/5 STGs and all LTGs  Plan: Address safe participation in higher level leisure activities, sensation of L digits, and strength and  coordination  Reason Skilled Services are Required: Pt continues to demonstrate decreased strength in L hand, coordination in L index/long fingers, and  altered sensation in L hand   Visit Diagnosis: Hemiplegia and hemiparesis following cerebral infarction affecting left non-dominant side (HCC)  Other lack of coordination  Muscle weakness (generalized)  Paresthesia of skin  Other symptoms and signs involving the nervous system    Problem List Patient Active Problem List   Diagnosis Date Noted  . CVA (cerebral vascular accident) ([place]) 10/21/2020  . Bilateral primary osteoarthritis of knee 02/15/2020  . Acquired trigger finger of right little finger 11/03/2019  . Encounter for orthopedic follow-up care 10/13/2019  . H/O total hip arthroplasty, bilateral 10/24/2018  . Gout 08/18/2017  . Overweight (BMI 25.0-29.9) 01/23/2017  . Foot pain, left 02/11/2016  . Sebaceous cyst 07/14/2014  . Sun-damaged skin 07/14/2014  . 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/14/2014  . Physical exam, annual 05/17/2012  . Inguinal hernia 08/12/2010  . 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 TOBACCO USER 05/01/2010  . Paroxysmal a-fib ([place]) 05/01/2010  . ABNORMAL ELECTROCARDIOGRAM 03/26/2010    Kathrine Cords, OTR/L, MSOT 12/31/2020, 3:30 PM  [place]. [place], [place], [address] Phone: [phone]   Fax:  [phone]  Name: [name] Short MRN: [id number] Date of Birth: [birth year]

## 2021-01-02 NOTE — Telephone Encounter (Signed)
Received fax from [facility] Patient Assistance foundation-patient has been approved and is eligible to receive Eliquis free-of charge 01/01/21-09/28/21.

## 2021-01-03 DIAGNOSIS — I480 Paroxysmal atrial fibrillation: Secondary | ICD-10-CM | POA: Diagnosis not present

## 2021-01-05 ENCOUNTER — Other Ambulatory Visit: Payer: Self-pay

## 2021-01-05 ENCOUNTER — Ambulatory Visit (HOSPITAL_BASED_OUTPATIENT_CLINIC_OR_DEPARTMENT_OTHER): Payer: Medicare Other | Attending: Cardiology | Admitting: Cardiovascular Disease

## 2021-01-05 DIAGNOSIS — G4730 Sleep apnea, unspecified: Secondary | ICD-10-CM | POA: Diagnosis not present

## 2021-01-05 DIAGNOSIS — R0902 Hypoxemia: Secondary | ICD-10-CM | POA: Insufficient documentation

## 2021-01-05 DIAGNOSIS — I480 Paroxysmal atrial fibrillation: Secondary | ICD-10-CM

## 2021-01-05 DIAGNOSIS — R0681 Apnea, not elsewhere classified: Secondary | ICD-10-CM

## 2021-01-05 DIAGNOSIS — Z79891 Long term (current) use of opiate analgesic: Secondary | ICD-10-CM | POA: Insufficient documentation

## 2021-01-05 DIAGNOSIS — R0683 Snoring: Secondary | ICD-10-CM | POA: Insufficient documentation

## 2021-01-05 DIAGNOSIS — Z79899 Other long term (current) drug therapy: Secondary | ICD-10-CM | POA: Diagnosis not present

## 2021-01-05 DIAGNOSIS — Z7901 Long term (current) use of anticoagulants: Secondary | ICD-10-CM | POA: Insufficient documentation

## 2021-01-07 ENCOUNTER — Ambulatory Visit: Payer: Medicare Other | Admitting: Occupational Therapy

## 2021-01-07 ENCOUNTER — Other Ambulatory Visit: Payer: Self-pay

## 2021-01-07 ENCOUNTER — Encounter: Payer: Self-pay | Admitting: Occupational Therapy

## 2021-01-07 DIAGNOSIS — I69354 Hemiplegia and hemiparesis following cerebral infarction affecting left non-dominant side: Secondary | ICD-10-CM

## 2021-01-07 DIAGNOSIS — R202 Paresthesia of skin: Secondary | ICD-10-CM | POA: Diagnosis not present

## 2021-01-07 DIAGNOSIS — M6281 Muscle weakness (generalized): Secondary | ICD-10-CM

## 2021-01-07 DIAGNOSIS — R29818 Other symptoms and signs involving the nervous system: Secondary | ICD-10-CM

## 2021-01-07 DIAGNOSIS — R278 Other lack of coordination: Secondary | ICD-10-CM

## 2021-01-07 NOTE — Therapy (Signed)
[place] [place], [place], [address] Phone: [phone]   Fax:  [phone]  Occupational Therapy Treatment  Patient Details  Name: [name] Short MRN: [id number] Date of Birth: [birth year] Referring Provider (OT): Domenic Polite, MD   Encounter Date: 01/07/2021   OT End of Session - 01/07/21 1411    Visit Number 11    Number of Visits 17    Date for OT Re-Evaluation 01/30/21    Authorization Type Medicare Part A and B    Progress Note Due on Visit 20    OT Start Time 0347   Previous session ran late   OT Stop Time 1450    OT Time Calculation (min) 45 min    Activity Tolerance Patient tolerated treatment well    Behavior During Therapy [facility] for tasks assessed/performed           Past Medical History:  Diagnosis Date  . Arthritis    WRIST  . Benign localized prostatic hyperplasia with lower urinary tract symptoms (LUTS)   . Complication of anesthesia    slow to wake  . ED (erectile dysfunction)   . GERD (gastroesophageal reflux disease)   . History of concussion    AS CHILD--  NO RESIDUAL  . History of gout   . History of kidney stones   . Hyperlipidemia   . Inguinal hernia, bilateral   . Nephrolithiasis    BILATERAL  . PAF (paroxysmal atrial fibrillation) ([place]) currently being followed by pcp   EPISODE --  2011  FOLLOW-UP W/ DR WALL  /  HAS NOT SEEN ANY CARDIOLOGIST SINCE    Past Surgical History:  Procedure Laterality Date  . APPENDECTOMY  1983  . CARDIOVASCULAR STRESS TEST  05-08-2010  DR WALL   NO EVIDENCE OF SCAR OR ISCHEMIA/ EF 54%/  PT HAD BOTH AFIB/ AFLUTTER DURING STUDY  . CARPAL TUNNEL RELEASE Right 01/ 14/ 2021  . CYSTOSCOPY W/ URETERAL STENT PLACEMENT Left 09/20/2013   Procedure: CYSTOSCOPY WITH STENT REPLACEMENT;  Surgeon: Fredricka Bonine, MD;  Location: [facility];  Service: Urology;  Laterality: Left;  . CYSTOSCOPY WITH URETEROSCOPY Left 09/20/2013    Procedure: CYSTOSCOPY WITH URETEROSCOPY;  Surgeon: Fredricka Bonine, MD;  Location: [facility];  Service: Urology;  Laterality: Left;  . CYSTOSCOPY WITH URETEROSCOPY AND STENT PLACEMENT Left 07/12/2013   Procedure: CYSTOSCOPY WITH LITHOLAPEXY, LEFT URETERAL STENT PLACEMENT;  Surgeon: Fredricka Bonine, MD;  Location[facility];  Service: Urology;  Laterality: Left;  . CYSTOSCOPY WITH URETEROSCOPY AND STENT PLACEMENT Bilateral 06/23/2014   Procedure: BILATERAL URETEROSCOPY, HOLMIUM LASER LITHOTRIPSY AND STENT PLACEMENT, RIGHT ;  Surgeon: Festus Aloe, MD;  Location: [facility];  Service: Urology;  Laterality: Bilateral;  . EXTRACORPOREAL SHOCK WAVE LITHOTRIPSY  X2  . EXTRACORPOREAL SHOCK WAVE LITHOTRIPSY Left 08-29-2013;   07-28-2013  . EXTRACORPOREAL SHOCK WAVE LITHOTRIPSY Right 12/09/2018   Procedure: EXTRACORPOREAL SHOCK WAVE LITHOTRIPSY (ESWL);  Surgeon: Festus Aloe, MD;  Location: [facility];  Service: Urology;  Laterality: Right;  . HOLMIUM LASER APPLICATION Left 42/59/5638   Procedure: HOLMIUM LASER APPLICATION;  Surgeon: Fredricka Bonine, MD;  Location: [facility];  Service: Urology;  Laterality: Left;  . INGUINAL HERNIA REPAIR Bilateral 10/16/2020   Procedure: BILATERAL OPEN INGUINAL HERNIA REPAIR WITH MESH;  Surgeon: Coralie Keens, MD;  Location: [facility];  Service: General;  Laterality: Bilateral;  LMA/TAP BLOCK  . LAPAROSCOPIC INGUINAL  HERNIA REPAIR Bilateral 09-04-2010   w/ mesh  . TONSILLECTOMY  AS CHILD  . 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 TOTAL HIP ARTHROPLASTY Right 11/15/2012   Procedure: RIGHT TOTAL HIP ARTHROPLASTY ANTERIOR APPROACH;  Surgeon: Marybelle Killings, MD;  Location: [facility];  Service: Orthopedics;  Laterality: Right;  Right total hip arthroplasty  . TRANSTHORACIC ECHOCARDIOGRAM  05-08-2010   NORMAL LVSF/  EF 26%/  GRADE I DIASTOLIC DYSFUNCTION/  MILD  BILATERAL ATRIUM ENLARGEMENT    There were no vitals filed for this visit.   Subjective Assessment - 01/07/21 1408    Subjective  Pt reports the altered sensation in his fingers has been feeling about the same, but does feel like he is able to move it more    Pertinent History Carpal tunnel release of R hand (January 2021); PMHx of a-fib, gout, OA, and HLD    Patient Stated Goals Improve functional use of L hand; return to activities pt was participating in prior to onset    Currently in Pain? No/denies            [facility] OT Assessment - 01/07/21 1556      Hand Function   Right Hand Grip (lbs) 45 lbs    Left Hand Grip (lbs) 49 lbs    Left Hand Lateral Pinch 16 lbs    Left 3 point pinch 10 lbs    Comment L hand grip strength has improved by 9 lbs, lateral pinch by 6 lbs, and 3-point pinch by 2 lbs; pt also demo'd improved coordination of 3-point pinch prehension compared w/ previous measurement            OT Treatments/Exercises (OP) - 01/07/21 1558      ADLs   LB Dressing Pt reports continued difficulty w/ shoe-tying and mild agitation w/ extended amount of time required at home. OT introduced compensatory method of using elastic shoe laces and had pt trial them during session; pt was receptive and handout was provided for self-purchase    ADL Comments Pt reports he has written medical clearance from his neurologist to return to driving, gradually; OT recommended he start in an empty parking lot or with very short distances in his neighborhood            OT Education - 01/07/21 1541    Education Details Education provided on nerve healing and interpretation of objective grip/pinch strength measurements    Person(s) Educated Patient    Methods Explanation    Comprehension Verbalized understanding            OT Short Term Goals - 01/07/21 1412      OT SHORT TERM GOAL #1   Title Pt will independently identify and demonstrate understanding of at least 3  compensatory/adaptive strategies, including AE, to be incorporated during BADLs    Baseline No currect compensatory/adaptive strategies    Time 4    Period Weeks    Status Achieved   12/31/20 - independently demonstrates/verbalizes understanding of 4 compensatory strategies   Target Date --      OT SHORT TERM GOAL #2   Title Pt will improve functional [place] for manipulating clothing fasteners as evidenced by placing at least 50% of easy-grip pegs into pegboard independently    Baseline Unable to complete 9-Hole Peg Test due to decreased FMC/dexterity    Time 4    Period Weeks    Status Achieved   11/28/20 - able to place 100% of easy-grip pegs into pegboard independently  OT SHORT TERM GOAL #3   Title Pt will be able to don his socks and shoes, using AE prn, at least 75% of the time    Baseline Requires assist from wife w/ socks and shoes    Time 4    Period Weeks    Status Achieved   12/12/20 - reports no difficulty w/ donning/doffing socks or shoes and demo'd ability to tie shoes w/ Mod I   Target Date --      OT SHORT TERM GOAL #4   Title Pt will increase grip and pinch strength of L hand to [facility] to improve participation in tennis activity    Baseline L hand grip 40#; lateral pinch 10#; 3-point pinch 8#    Time 4    Period Weeks    Status Achieved   01/07/21 - L hand grip 49#; lateral pinch 16#, 3-point pinch 10#   Target Date --      OT SHORT TERM GOAL #5   Title Pt will independently verbalize understanding of safety strategies for decreased/altered sensation of L index and long fingers    Baseline Decreased knowledge of sensory impairment    Time 4    Period Weeks    Status New    Target Date 01/30/21            OT Long Term Goals - 12/31/20 1445      OT LONG TERM GOAL #1   Title Pt will be independent with HEP designed for strengthening and coordination of LUE and report carryover at home    Baseline No current HEP    Time 8    Period Weeks    Status Achieved    12/31/20 - pt consistently reports carryover of HEP     OT LONG TERM GOAL #2   Title Pt will complete UB/LB dressing with Mod I    Baseline SPV/Safety; unable to manipulate fasteners    Time 8    Period Weeks    Status Achieved   12/17/20 - pt reports independence w/ dressing at home     OT [place] #3   Title Pt will complete symmetrical/asymmetrical bilateral coordination activities with Mod I at least 75% of the time to improve participation in grooming tasks    Baseline Pt reports difficulty using left hand to stabilize during tasks required both hands    Time 8    Period Weeks    Status Achieved   12/25/20 - able to complete bilateral coordination tasks independently w/out difficulty consistently     OT LONG TERM GOAL #4   Title Pt will improve functional LUE use to facilitate return to leisure activities as evidenced by increasing Box and Block Test by at least 10 blocks with L hand    Baseline Box and Block Test: R hand (46 blocks); L hand (29 blocks)    Time 8    Period Weeks    Status Achieved   12/31/20 - R hand (47 blocks); L hand (40 blocks)     OT LONG TERM GOAL #5   Title --    Time --    Period --    Status --    Target Date --            Plan - 01/07/21 1543    Clinical Impression Statement Pt has met 4/5 STGs and 4/4 LTGs at this time and reports primary difficulties w/ functional activities involving decreased/altered sensation of L index and long  fingers and residual deficits from carpal tunnel release of R hand in January of 2021. OT introduced idea of d/c within next few weeks due to pt's current functional status and great understanding/carryover of compensatory strategies, but pt requested to continue working on coordination and sensation in L hand; OT discussed following-up w/ neurology regarding persistence of sensory impairment and that OT will adjust POC as necessary depending on further recommendation from physician.    OT Occupational Profile and  History Detailed Assessment- Review of Records and additional review of physical, cognitive, psychosocial history related to current functional performance    Occupational performance deficits (Please refer to evaluation for details): ADL's;IADL's;Leisure    Body Structure / Function / Physical Skills ADL;ROM;UE functional use;FMC;Decreased knowledge of use of DME;Body mechanics;Dexterity;Sensation;GMC;Strength;Pain;Coordination;IADL;Cardiopulmonary status limiting activity;Vision    Rehab Potential Good    Clinical Decision Making Several treatment options, min-mod task modification necessary    Comorbidities Affecting Occupational Performance: Presence of comorbidities impacting occupational performance    Modification or Assistance to Complete Evaluation  Min-Moderate modification of tasks or assist with assess necessary to complete eval    OT Frequency 1x / week    OT Duration 4 weeks    OT Treatment/Interventions Self-care/ADL training;Electrical Stimulation;Therapeutic exercise;Visual/perceptual remediation/compensation;Moist Heat;Neuromuscular education;Patient/family education;Energy conservation;Fluidtherapy;Therapeutic activities;Cryotherapy;Paraffin;DME and/or AE instruction;Contrast Bath;Manual Therapy;Passive range of motion;Cognitive remediation/compensation    Plan Safety strategies for sensory impairment    Consulted and Agree with Plan of Care Patient;Family member/caregiver    Family Member Consulted Wife           Patient will benefit from skilled therapeutic intervention in order to improve the following deficits and impairments:   Body Structure / Function / Physical Skills: ADL,ROM,UE functional use,FMC,Decreased knowledge of use of DME,Body mechanics,Dexterity,Sensation,GMC,Strength,Pain,Coordination,IADL,Cardiopulmonary status limiting activity,Vision       Visit Diagnosis: Hemiplegia and hemiparesis following cerebral infarction affecting left non-dominant side  (HCC)  Other lack of coordination  Muscle weakness (generalized)  Paresthesia of skin  Other symptoms and signs involving the nervous system    Problem List Patient Active Problem List   Diagnosis Date Noted  . CVA (cerebral vascular accident) ([place]) 10/21/2020  . Bilateral primary osteoarthritis of knee 02/15/2020  . Acquired trigger finger of right little finger 11/03/2019  . Encounter for orthopedic follow-up care 10/13/2019  . H/O total hip arthroplasty, bilateral 10/24/2018  . Gout 08/18/2017  . Overweight (BMI 25.0-29.9) 01/23/2017  . Foot pain, left 02/11/2016  . Sebaceous cyst 07/14/2014  . Sun-damaged skin 07/14/2014  . 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/14/2014  . Physical exam, annual 05/17/2012  . Inguinal hernia 08/12/2010  . 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 TOBACCO USER 05/01/2010  . Paroxysmal a-fib ([place]) 05/01/2010  . ABNORMAL ELECTROCARDIOGRAM 03/26/2010     Kathrine Cords, OTR/L, MSOT 01/07/2021, 4:10 PM  [place] [place], [place], [address] Phone: [phone]   Fax:  [phone]  Name: REGINAL WOJCICKI MRN: [id number] Date of Birth: 1946-05-26

## 2021-01-14 ENCOUNTER — Other Ambulatory Visit: Payer: Self-pay

## 2021-01-14 ENCOUNTER — Ambulatory Visit: Payer: Medicare Other | Admitting: Occupational Therapy

## 2021-01-14 ENCOUNTER — Encounter: Payer: Self-pay | Admitting: Occupational Therapy

## 2021-01-14 DIAGNOSIS — R278 Other lack of coordination: Secondary | ICD-10-CM | POA: Diagnosis not present

## 2021-01-14 DIAGNOSIS — R202 Paresthesia of skin: Secondary | ICD-10-CM | POA: Diagnosis not present

## 2021-01-14 DIAGNOSIS — I69354 Hemiplegia and hemiparesis following cerebral infarction affecting left non-dominant side: Secondary | ICD-10-CM | POA: Diagnosis not present

## 2021-01-14 DIAGNOSIS — R29818 Other symptoms and signs involving the nervous system: Secondary | ICD-10-CM

## 2021-01-14 DIAGNOSIS — M6281 Muscle weakness (generalized): Secondary | ICD-10-CM

## 2021-01-14 NOTE — Therapy (Signed)
[facility] [address] [place], [place], [address] Phone: [phone]   Fax:  [phone]  Occupational Therapy Treatment  Patient Details  Name: [name] Short MRN: [id number] Date of Birth: 05/27/1946 Referring Provider (OT): Zannie CovePreetha Joseph, MD   Encounter Date: 01/14/2021   OT End of Session - 01/14/21 1421    Visit Number 12    Number of Visits 17    Date for OT Re-Evaluation 01/30/21    Authorization Type Medicare Part A and B    Progress Note Due on Visit 20    OT Start Time 1403    OT Stop Time 1445    OT Time Calculation (min) 42 min    Activity Tolerance Patient tolerated treatment well    Behavior During Therapy [facility] for tasks assessed/performed           Past Medical History:  Diagnosis Date  . Arthritis    WRIST  . Benign localized prostatic hyperplasia with lower urinary tract symptoms (LUTS)   . Complication of anesthesia    slow to wake  . ED (erectile dysfunction)   . GERD (gastroesophageal reflux disease)   . History of concussion    AS CHILD--  NO RESIDUAL  . History of gout   . History of kidney stones   . Hyperlipidemia   . Inguinal hernia, bilateral   . Nephrolithiasis    BILATERAL  . PAF (paroxysmal atrial fibrillation) (HCC) currently being followed by pcp   EPISODE --  2011  FOLLOW-UP W/ DR WALL  /  HAS NOT SEEN ANY CARDIOLOGIST SINCE    Past Surgical History:  Procedure Laterality Date  . APPENDECTOMY  1983  . CARDIOVASCULAR STRESS TEST  05-08-2010  DR WALL   NO EVIDENCE OF SCAR OR ISCHEMIA/ EF 54%/  PT HAD BOTH AFIB/ AFLUTTER DURING STUDY  . CARPAL TUNNEL RELEASE Right 01/ 14/ 2021  . CYSTOSCOPY W/ URETERAL STENT PLACEMENT Left 09/20/2013   Procedure: CYSTOSCOPY WITH STENT REPLACEMENT;  Surgeon: Antony HasteMatthew Ramsey Eskridge, MD;  [facility] [place];  Service: Urology;  Laterality: Left;  . CYSTOSCOPY WITH URETEROSCOPY Left 09/20/2013   Procedure: CYSTOSCOPY WITH  URETEROSCOPY;  Surgeon: Antony HasteMatthew Ramsey Eskridge, MD;  [facility] [place];  Service: Urology;  Laterality: Left;  . CYSTOSCOPY WITH URETEROSCOPY AND STENT PLACEMENT Left 07/12/2013   Procedure: CYSTOSCOPY WITH LITHOLAPEXY, LEFT URETERAL STENT PLACEMENT;  Surgeon: Antony HasteMatthew Ramsey Eskridge, MD;  [facility] [place];  Service: Urology;  Laterality: Left;  . CYSTOSCOPY WITH URETEROSCOPY AND STENT PLACEMENT Bilateral 06/23/2014   Procedure: BILATERAL URETEROSCOPY, HOLMIUM LASER LITHOTRIPSY AND STENT PLACEMENT, RIGHT ;  Surgeon: Jerilee FieldMatthew Eskridge, MD;  [facility] [place];  Service: Urology;  Laterality: Bilateral;  . EXTRACORPOREAL SHOCK WAVE LITHOTRIPSY  X2  . EXTRACORPOREAL SHOCK WAVE LITHOTRIPSY Left 08-29-2013;   07-28-2013  . EXTRACORPOREAL SHOCK WAVE LITHOTRIPSY Right 12/09/2018   Procedure: EXTRACORPOREAL SHOCK WAVE LITHOTRIPSY (ESWL);  Surgeon: Jerilee FieldEskridge, Matthew, MD;  Location: [facility];  Service: Urology;  Laterality: Right;  . HOLMIUM LASER APPLICATION Left 09/20/2013   Procedure: HOLMIUM LASER APPLICATION;  Surgeon: Antony HasteMatthew Ramsey Eskridge, MD;  [facility] [place];  Service: Urology;  Laterality: Left;  . INGUINAL HERNIA REPAIR Bilateral 10/16/2020   Procedure: BILATERAL OPEN INGUINAL HERNIA REPAIR WITH MESH;  Surgeon: Abigail MiyamotoBlackman, Douglas, MD;  [facility] ;  Service: General;  Laterality: Bilateral;  LMA/TAP BLOCK  . LAPAROSCOPIC INGUINAL HERNIA REPAIR Bilateral 09-04-2010  w/ mesh  . TONSILLECTOMY  AS CHILD  . 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 TRANSTHORACIC ECHOCARDIOGRAM  05-08-2010   NORMAL LVSF/  EF 55%/  GRADE I DIASTOLIC DYSFUNCTION/  MILD BILATERAL ATRIUM ENLARGEMENT     There were no vitals filed for this visit.   Subjective Assessment - 01/14/21 1405    Subjective  Pt stated he played tennis last week for the first time since his stroke    Pertinent History Carpal tunnel release of R hand (January 2021); PMHx of a-fib, gout, OA, and HLD    Patient Stated Goals Improve functional use of L hand; return to activities pt was participating in prior to onset    Currently in Pain? No/denies              OT Treatments/Exercises (OP) - 01/14/21 1400      Fine Motor Coordination (Hand/Wrist)   Fine Motor Coordination Manipulation of small objects    Manipulation of small objects Completed braiding of strings; after OT demo and initial repetitions, pt able to loosely braid w/out diffiulty. Pt also instructed to thread small beads onto string; demo'd significant improvement w/ in-hand manipulation of beads and control when threading compared to previous sessions             OT Education - 01/14/21 1450    Education Details Education provided on safety strategies for altered sensation/numbness in L index and long fingers    Person(s) Educated Patient    Methods Explanation    Comprehension Verbalized understanding             OT Short Term Goals - 01/14/21 1616      OT SHORT TERM GOAL #1   Title Pt will independently identify and demonstrate understanding of at least 3 compensatory/adaptive strategies, including AE, to be incorporated during BADLs    Baseline No currect compensatory/adaptive strategies    Time 4    Period Weeks    Status Achieved   12/31/20 - independently demonstrates/verbalizes understanding of 4 compensatory strategies     OT SHORT TERM GOAL #2   Title Pt will improve functional FMC for manipulating clothing fasteners as evidenced by placing at least 50% of easy-grip pegs into pegboard independently    Baseline Unable to complete 9-Hole Peg Test due to decreased FMC/dexterity    Time 4    Period Weeks    Status Achieved    11/28/20 - able to place 100% of easy-grip pegs into pegboard independently     OT SHORT TERM GOAL #3   Title Pt will be able to don his socks and shoes, using AE prn, at least 75% of the time    Baseline Requires assist from wife w/ socks and shoes    Time 4    Period Weeks    Status Achieved   12/12/20 - reports no difficulty w/ donning/doffing socks or shoes and demo'd ability to tie shoes w/ Mod I     OT SHORT TERM GOAL #4   Title Pt will increase grip and pinch strength of L hand to [facility] to improve participation in tennis activity    Baseline L hand grip 40#; lateral pinch 10#; 3-point pinch 8#    Time 4    Period Weeks  Status Achieved   01/07/21 - L hand grip 49#; lateral pinch 16#, 3-point pinch 10#     OT SHORT TERM GOAL #5   Title Pt will independently verbalize understanding of safety strategies for decreased/altered sensation of L index and long fingers    Baseline Decreased knowledge of sensory impairment    Time 4    Period Weeks    Status On-going    Target Date 01/30/21             OT Long Term Goals - 12/31/20 1445      OT LONG TERM GOAL #1   Title Pt will be independent with HEP designed for strengthening and coordination of LUE and report carryover at home    Baseline No current HEP    Time 8    Period Weeks    Status Achieved   12/31/20 - pt consistently reports carryover of HEP     OT LONG TERM GOAL #2   Title Pt will complete UB/LB dressing with Mod I    Baseline SPV/Safety; unable to manipulate fasteners    Time 8    Period Weeks    Status Achieved   12/17/20 - pt reports independence w/ dressing at home     OT LONG TERM GOAL #3   Title Pt will complete symmetrical/asymmetrical bilateral coordination activities with Mod I at least 75% of the time to improve participation in grooming tasks    Baseline Pt reports difficulty using left hand to stabilize during tasks required both hands    Time 8    Period Weeks    Status Achieved   12/25/20 - able to  complete bilateral coordination tasks independently w/out difficulty consistently     OT LONG TERM GOAL #4   Title Pt will improve functional LUE use to facilitate return to leisure activities as evidenced by increasing Box and Block Test by at least 10 blocks with L hand    Baseline Box and Block Test: R hand (46 blocks); L hand (29 blocks)    Time 8    Period Weeks    Status Achieved   12/31/20 - R hand (47 blocks); L hand (40 blocks)     OT LONG TERM GOAL #5   Title --    Time --    Period --    Status --    Target Date --             Plan - 01/14/21 1601    Clinical Impression Statement Pt completed [facility] activities this session to continue to improve dexterity; pt exhibited significant improvement in control and coordination and is able to compensate for sensory impairment when visually attending to task. Due to persistence of numbness/altered sensation in L index/long fingers, OT provided handout for safety considerations for sensation loss and reviewed them w/ pt. OT also reached out to pt's neurologist regarding altered sensation and POC will be updated accordingly prn; pt will be appropriate for d/c if no further intervention is required within skilled OT services.    OT Occupational Profile and History Detailed Assessment- Review of Records and additional review of physical, cognitive, psychosocial history related to current functional performance    Occupational performance deficits (Please refer to evaluation for details): ADL's;IADL's;Leisure    Body Structure / Function / Physical Skills ADL;ROM;UE functional use;FMC;Decreased knowledge of use of DME;Body mechanics;Dexterity;Sensation;GMC;Strength;Pain;Coordination;IADL;Cardiopulmonary status limiting activity;Vision    Rehab Potential Good    Clinical Decision Making Several treatment options, min-mod task modification  necessary    Comorbidities Affecting Occupational Performance: Presence of comorbidities impacting  occupational performance    Modification or Assistance to Complete Evaluation  Min-Moderate modification of tasks or assist with assess necessary to complete eval    OT Frequency 1x / week    OT Duration 4 weeks    OT Treatment/Interventions Self-care/ADL training;Electrical Stimulation;Therapeutic exercise;Visual/perceptual remediation/compensation;Moist Heat;Neuromuscular education;Patient/family education;Energy conservation;Fluidtherapy;Therapeutic activities;Cryotherapy;Paraffin;DME and/or AE instruction;Contrast Bath;Manual Therapy;Passive range of motion;Cognitive remediation/compensation    Plan Prepare for d/c    Consulted and Agree with Plan of Care Patient;Family member/caregiver    Family Member Consulted Wife           Patient will benefit from skilled therapeutic intervention in order to improve the following deficits and impairments:   Body Structure / Function / Physical Skills: ADL,ROM,UE functional use,FMC,Decreased knowledge of use of DME,Body mechanics,Dexterity,Sensation,GMC,Strength,Pain,Coordination,IADL,Cardiopulmonary status limiting activity,Vision       Visit Diagnosis: Hemiplegia and hemiparesis following cerebral infarction affecting left non-dominant side (HCC)  Other lack of coordination  Muscle weakness (generalized)  Paresthesia of skin  Other symptoms and signs involving the nervous system    Problem List Patient Active Problem List   Diagnosis Date Noted  . CVA (cerebral vascular accident) (HCC) 10/21/2020  . Bilateral primary osteoarthritis of knee 02/15/2020  . Acquired trigger finger of right little finger 11/03/2019  . Encounter for orthopedic follow-up care 10/13/2019  . H/O total hip arthroplasty, bilateral 10/24/2018  . Gout 08/18/2017  . Overweight (BMI 25.0-29.9) 01/23/2017  . Foot pain, left 02/11/2016  . Sebaceous cyst 07/14/2014  . Sun-damaged skin 07/14/2014  . 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/14/2014  . Physical exam, annual  05/17/2012  . Inguinal hernia 08/12/2010  . 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 TOBACCO USER 05/01/2010  . Paroxysmal a-fib (HCC) 05/01/2010  . ABNORMAL ELECTROCARDIOGRAM 03/26/2010     Rosie Fate, OTR/L, MSOT 01/14/2021, 4:33 PM  [facility] [address] [place], [place], [address] Phone: [phone]   Fax:  [phone]  Name: [name] Short MRN: [id number] Date of Birth: [birth year]

## 2021-01-21 ENCOUNTER — Other Ambulatory Visit: Payer: Self-pay

## 2021-01-21 ENCOUNTER — Encounter: Payer: Self-pay | Admitting: Occupational Therapy

## 2021-01-21 ENCOUNTER — Ambulatory Visit: Payer: Medicare Other | Admitting: Occupational Therapy

## 2021-01-21 DIAGNOSIS — R202 Paresthesia of skin: Secondary | ICD-10-CM

## 2021-01-21 DIAGNOSIS — R29818 Other symptoms and signs involving the nervous system: Secondary | ICD-10-CM | POA: Diagnosis not present

## 2021-01-21 DIAGNOSIS — I69354 Hemiplegia and hemiparesis following cerebral infarction affecting left non-dominant side: Secondary | ICD-10-CM

## 2021-01-21 DIAGNOSIS — R278 Other lack of coordination: Secondary | ICD-10-CM

## 2021-01-21 DIAGNOSIS — M6281 Muscle weakness (generalized): Secondary | ICD-10-CM

## 2021-01-21 NOTE — Therapy (Signed)
[name] [place], [place], [address] Phone: [phone]   Fax:  [phone]  Occupational Therapy Treatment & Discharge Summary  Patient Details  Name: [name] Short MRN: [id number] Date of Birth: Jun 17, 1946 Referring Provider (OT): Domenic Polite, MD   Encounter Date: 01/21/2021   OT End of Session - 01/21/21 1430    Visit Number 13    Number of Visits 17    Date for OT Re-Evaluation 01/30/21    Authorization Type Medicare Part A and B    Progress Note Due on Visit 20    OT Start Time 1403    OT Stop Time 1444    OT Time Calculation (min) 41 min    Activity Tolerance Patient tolerated treatment well    Behavior During Therapy [facility] for tasks assessed/performed           Past Medical History:  Diagnosis Date  . Arthritis    WRIST  . Benign localized prostatic hyperplasia with lower urinary tract symptoms (LUTS)   . Complication of anesthesia    slow to wake  . ED (erectile dysfunction)   . GERD (gastroesophageal reflux disease)   . History of concussion    AS CHILD--  NO RESIDUAL  . History of gout   . History of kidney stones   . Hyperlipidemia   . Inguinal hernia, bilateral   . Nephrolithiasis    BILATERAL  . PAF (paroxysmal atrial fibrillation) ([place]) currently being followed by pcp   EPISODE --  2011  FOLLOW-UP W/ DR WALL  /  HAS NOT SEEN ANY CARDIOLOGIST SINCE    Past Surgical History:  Procedure Laterality Date  . APPENDECTOMY  1983  . CARDIOVASCULAR STRESS TEST  05-08-2010  DR WALL   NO EVIDENCE OF SCAR OR ISCHEMIA/ EF 54%/  PT HAD BOTH AFIB/ AFLUTTER DURING STUDY  . CARPAL TUNNEL RELEASE Right 01/ 14/ 2021  . CYSTOSCOPY W/ URETERAL STENT PLACEMENT Left 09/20/2013   Procedure: CYSTOSCOPY WITH STENT REPLACEMENT;  Surgeon: Fredricka Bonine, MD;  Location: [facility];  Service: Urology;  Laterality: Left;  . CYSTOSCOPY WITH URETEROSCOPY Left 09/20/2013    Procedure: CYSTOSCOPY WITH URETEROSCOPY;  Surgeon: Fredricka Bonine, MD;  Location[facility];  Service: Urology;  Laterality: Left;  . CYSTOSCOPY WITH URETEROSCOPY AND STENT PLACEMENT Left 07/12/2013   Procedure: CYSTOSCOPY WITH LITHOLAPEXY, LEFT URETERAL STENT PLACEMENT;  Surgeon: Fredricka Bonine, MD;  Location: [facility];  Service: Urology;  Laterality: Left;  . CYSTOSCOPY WITH URETEROSCOPY AND STENT PLACEMENT Bilateral 06/23/2014   Procedure: BILATERAL URETEROSCOPY, HOLMIUM LASER LITHOTRIPSY AND STENT PLACEMENT, RIGHT ;  Surgeon: Festus Aloe, MD;  Location: [facility];  Service: Urology;  Laterality: Bilateral;  . EXTRACORPOREAL SHOCK WAVE LITHOTRIPSY  X2  . EXTRACORPOREAL SHOCK WAVE LITHOTRIPSY Left 08-29-2013;   07-28-2013  . EXTRACORPOREAL SHOCK WAVE LITHOTRIPSY Right 12/09/2018   Procedure: EXTRACORPOREAL SHOCK WAVE LITHOTRIPSY (ESWL);  Surgeon: Festus Aloe, MD;  Location: [facility];  Service: Urology;  Laterality: Right;  . HOLMIUM LASER APPLICATION Left 50/53/9767   Procedure: HOLMIUM LASER APPLICATION;  Surgeon: Fredricka Bonine, MD;  Location: [facility];  Service: Urology;  Laterality: Left;  . INGUINAL HERNIA REPAIR Bilateral 10/16/2020   Procedure: BILATERAL OPEN INGUINAL HERNIA REPAIR WITH MESH;  Surgeon: Coralie Keens, MD;  Location: [facility];  Service: General;  Laterality: Bilateral;  LMA/TAP BLOCK  . LAPAROSCOPIC INGUINAL HERNIA REPAIR  Bilateral 09-04-2010   w/ mesh  . TONSILLECTOMY  AS CHILD  . 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 TOTAL HIP ARTHROPLASTY Right 11/15/2012   Procedure: RIGHT TOTAL HIP ARTHROPLASTY ANTERIOR APPROACH;  Surgeon: Marybelle Killings, MD;  Location: [facility];  Service: Orthopedics;  Laterality: Right;  Right total hip arthroplasty  . TRANSTHORACIC ECHOCARDIOGRAM  05-08-2010   NORMAL LVSF/  EF 69%/  GRADE I DIASTOLIC DYSFUNCTION/  MILD  BILATERAL ATRIUM ENLARGEMENT    There were no vitals filed for this visit.   Subjective Assessment - 01/21/21 1400    Subjective  "You all have been very helpful"    Pertinent History Carpal tunnel release of R hand (January 2021); PMHx of a-fib, gout, OA, and HLD    Patient Stated Goals Improve functional use of L hand; return to activities pt was participating in prior to onset    Currently in Pain? No/denies             OT Treatments/Exercises (OP) - 01/21/21 1430      ADLs   Increased Safety Strategies OT reviewed safety strategies due to decrease sensation in L index/long fingers; pt independently verbalized understanding of various strategies discussed in prior session(s)      Wrist Exercises   Other wrist exercises Gentle AROM of L wrist flexion/extension w/ straightening of fingers during extension completed 10x to facilitate tendon gliding      Hand Exercises   MCPJ Flexion AROM;Left;10 reps   w/ blocking; L index finger and long finger   PIPJ Flexion AROM;Left;10 reps   L index finger and long finger   Other Hand Exercises Tendon gliding exercise involving reps of L fist, transition to hook fist, and then straightening fingers completed 10x w/ OT provided demo and cueing to facilitate appropriate alignment             OT Education - 01/21/21 1430    Education Details OT reviewed and updated HEP w/ pt and provided handout    Person(s) Educated Patient    Methods Explanation;Handout;Demonstration    Comprehension Verbalized understanding;Returned demonstration            OT Short Term Goals - 01/21/21 1436      OT SHORT TERM GOAL #1   Title Pt will independently identify and demonstrate understanding of at least 3 compensatory/adaptive strategies, including AE, to be incorporated during BADLs    Baseline No currect compensatory/adaptive strategies    Time 4    Period Weeks    Status Achieved   12/31/20 - independently demonstrates/verbalizes understanding of 4  compensatory strategies     OT SHORT TERM GOAL #2   Title Pt will improve functional [place] for manipulating clothing fasteners as evidenced by placing at least 50% of easy-grip pegs into pegboard independently    Baseline Unable to complete 9-Hole Peg Test due to decreased FMC/dexterity    Time 4    Period Weeks    Status Achieved   11/28/20 - able to place 100% of easy-grip pegs into pegboard independently     OT SHORT TERM GOAL #3   Title Pt will be able to don his socks and shoes, using AE prn, at least 75% of the time    Baseline Requires assist from wife w/ socks and shoes    Time 4    Period Weeks    Status Achieved   12/12/20 - reports no difficulty w/ donning/doffing socks or shoes and demo'd ability to tie shoes w/  Mod I     OT SHORT TERM GOAL #4   Title Pt will increase grip and pinch strength of L hand to [facility] to improve participation in tennis activity    Baseline L hand grip 40#; lateral pinch 10#; 3-point pinch 8#    Time 4    Period Weeks    Status Achieved   01/07/21 - L hand grip 49#; lateral pinch 16#, 3-point pinch 10#     OT SHORT TERM GOAL #5   Title Pt will independently verbalize understanding of safety strategies for decreased/altered sensation of L index and long fingers    Baseline Decreased knowledge of sensory impairment    Time 4    Period Weeks    Status Achieved   01/21/21 - able to independently verbalize 3 safety strategies for decreased sensation   Target Date --             OT Long Term Goals - 12/31/20 1445      OT LONG TERM GOAL #1   Title Pt will be independent with HEP designed for strengthening and coordination of LUE and report carryover at home    Baseline No current HEP    Time 8    Period Weeks    Status Achieved   12/31/20 - pt consistently reports carryover of HEP     OT LONG TERM GOAL #2   Title Pt will complete UB/LB dressing with Mod I    Baseline SPV/Safety; unable to manipulate fasteners    Time 8    Period Weeks    Status  Achieved   12/17/20 - pt reports independence w/ dressing at home     OT [place] #3   Title Pt will complete symmetrical/asymmetrical bilateral coordination activities with Mod I at least 75% of the time to improve participation in grooming tasks    Baseline Pt reports difficulty using left hand to stabilize during tasks required both hands    Time 8    Period Weeks    Status Achieved   12/25/20 - able to complete bilateral coordination tasks independently w/out difficulty consistently     OT LONG TERM GOAL #4   Title Pt will improve functional LUE use to facilitate return to leisure activities as evidenced by increasing Box and Block Test by at least 10 blocks with L hand    Baseline Box and Block Test: R hand (46 blocks); L hand (29 blocks)    Time 8    Period Weeks    Status Achieved   12/31/20 - R hand (47 blocks); L hand (40 blocks)     OT LONG TERM GOAL #5   Title --    Time --    Period --    Status --    Target Date --             Plan - 01/21/21 1445    Clinical Impression Statement Due to persistent altered/decreased sensation in L index and long fingers, OT reached out to pt's neurologist for updated POC recommendations, if needed, and for reference during pt's next follow-up visit. Due to pattern, timing, and pt's previous experience w/ nerve compression, decreased sensation likely due to residual CVA deficit. OT discussed this with pt, reiterated extended time typically required for nerve healing, and updated HEP. At this time pt reports he is satisfied with progress in therapy and that sensory impairment is more a nuisance than it is significantly functionally limiting. Pt has met all  STGs and LTGs and is appropriate for d/c from skilled OT to HEP at this time; pt agreeable to discharge plan. Pt encouraged to follow-up w/ neurologist regarding sensory impairment during next appt and to call OT with any additional concerns or questions, if needed.    OT Occupational  Profile and History Detailed Assessment- Review of Records and additional review of physical, cognitive, psychosocial history related to current functional performance    Occupational performance deficits (Please refer to evaluation for details): ADL's;IADL's;Leisure    Body Structure / Function / Physical Skills ADL;ROM;UE functional use;FMC;Decreased knowledge of use of DME;Body mechanics;Dexterity;Sensation;GMC;Strength;Pain;Coordination;IADL;Cardiopulmonary status limiting activity;Vision    Rehab Potential Good    Clinical Decision Making Several treatment options, min-mod task modification necessary    Comorbidities Affecting Occupational Performance: Presence of comorbidities impacting occupational performance    Modification or Assistance to Complete Evaluation  Min-Moderate modification of tasks or assist with assess necessary to complete eval    OT Frequency 1x / week    OT Duration 4 weeks    OT Treatment/Interventions Self-care/ADL training;Electrical Stimulation;Therapeutic exercise;Visual/perceptual remediation/compensation;Moist Heat;Neuromuscular education;Patient/family education;Energy conservation;Fluidtherapy;Therapeutic activities;Cryotherapy;Paraffin;DME and/or AE instruction;Contrast Bath;Manual Therapy;Passive range of motion;Cognitive remediation/compensation    Plan --    Consulted and Agree with Plan of Care Patient;Family member/caregiver    Family Member Consulted Wife           Patient will benefit from skilled therapeutic intervention in order to improve the following deficits and impairments:   Body Structure / Function / Physical Skills: ADL,ROM,UE functional use,FMC,Decreased knowledge of use of DME,Body mechanics,Dexterity,Sensation,GMC,Strength,Pain,Coordination,IADL,Cardiopulmonary status limiting activity,Vision      OCCUPATIONAL THERAPY DISCHARGE SUMMARY  Visits from Start of Care: 13  Current functional level related to goals / functional  outcomes: Pt able to manipulate clothing fasteners w/ AE prn, is independent w/ BADLs, and is slowly returning to increased participation in IADLs. Pt was able to complete 9-HPT (unable to w/ L hand at eval), increased gross grasp, lateral, and 3-point pinch, and improved Box and Blocks time by 11 blocks w/ LUE.   Remaining deficits: Altered sensation in L index and long fingers, decreased dexterity, decreased pinch strength   Education / Equipment: Compensatory strategies, AE/devices, sensory re-education, desensitization, safety strategies  Plan: Patient agrees to discharge.  Patient goals were met. Patient is being discharged due to meeting the stated rehab goals.  ?????      Visit Diagnosis: Hemiplegia and hemiparesis following cerebral infarction affecting left non-dominant side (HCC)  Other lack of coordination  Muscle weakness (generalized)  Paresthesia of skin  Other symptoms and signs involving the nervous system    Problem List Patient Active Problem List   Diagnosis Date Noted  . CVA (cerebral vascular accident) ([place]) 10/21/2020  . Bilateral primary osteoarthritis of knee 02/15/2020  . Acquired trigger finger of right little finger 11/03/2019  . Encounter for orthopedic follow-up care 10/13/2019  . H/O total hip arthroplasty, bilateral 10/24/2018  . Gout 08/18/2017  . Overweight (BMI 25.0-29.9) 01/23/2017  . Foot pain, left 02/11/2016  . Sebaceous cyst 07/14/2014  . Sun-damaged skin 07/14/2014  . 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/14/2014  . Physical exam, annual 05/17/2012  . Inguinal hernia 08/12/2010  . 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 TOBACCO USER 05/01/2010  . Paroxysmal a-fib ([place]) 05/01/2010  . ABNORMAL ELECTROCARDIOGRAM 03/26/2010     Kathrine Cords, OTR/L, MSOT 01/21/2021, 2:45 PM  [place] [place], [place], [address] Phone: [phone]   Fax:  [phone]  Name: MANVILLE RICO MRN: [id number] Date of Birth: [birth year]

## 2021-01-21 NOTE — Patient Instructions (Signed)
Access Code: [email or web address] URL: [email or web address] Date: 01/21/2021  Exercises Tendon Gliding - start with fingers straight, bend to a light fist, transition to hook fist, return to starting position; repeat Wrist Flexion and Extension AROM Finger PIP Flexion AROM Finger PIP Flexion and Extension with Blocking AROM  Complete exercises 10 times per set. Do 1-2 sets per session. Do ~3 sessions per day.

## 2021-01-26 ENCOUNTER — Encounter (HOSPITAL_BASED_OUTPATIENT_CLINIC_OR_DEPARTMENT_OTHER): Payer: Self-pay | Admitting: Cardiovascular Disease

## 2021-01-26 NOTE — Procedures (Signed)
Patient Name: [name] Short, [name] Short Date: 01/05/2021 Gender: Male D.O.B: March 02, 1946 Age (years): 74 Referring Provider: Epifanio Lesches Height (inches): 67 Interpreting Physician: Nicki Guadalajara MD, ABSM Weight (lbs): 170 RPSGT: Lowry Ram BMI: 27 MRN: [id number] Neck Size: 16.00  CLINICAL INFORMATION Sleep Study Type: NPSG  Indication for sleep study: Obesity, PAF  Epworth Sleepiness Score: 3  SLEEP STUDY TECHNIQUE As per the AASM Manual for the Scoring of Sleep and Associated Events v2.3 (April 2016) with a hypopnea requiring 4% desaturations.  The channels recorded and monitored were frontal, central and occipital EEG, electrooculogram (EOG), submentalis EMG (chin), nasal and oral airflow, thoracic and abdominal wall motion, anterior tibialis EMG, snore microphone, electrocardiogram, and pulse oximetry.  MEDICATIONS acetaminophen (TYLENOL) 500 MG tablet apixaban (ELIQUIS) 5 MG TABS tablet calcium carbonate (TUMS - DOSED IN MG ELEMENTAL CALCIUM) 500 MG chewable tablet Cimetidine (TAGAMET PO) Colchicine 0.6 MG CAPS diltiazem (CARDIZEM CD) 120 MG 24 hr capsule fenofibrate 160 MG tablet Multiple Vitamin (MULTIVITAMIN WITH MINERALS) TABS oxyCODONE (OXY IR/ROXICODONE) 5 MG immediate release tablet Polyethylene Glycol 400 (BLINK TEARS) 0.25 % SOLN Red Yeast Rice 600 MG TABS rosuvastatin (CRESTOR) 20 MG tablet tadalafil (CIALIS) 5 MG tablet tamsulosin (FLOMAX) 0.4 MG CAPS capsule Medications self-administered by patient taken the night of the study : N/A  SLEEP ARCHITECTURE The study was initiated at 10:37:23 PM and ended at 5:07:54 AM.  Sleep onset time was 11.7 minutes and the sleep efficiency was 72.6%%. The total sleep time was 283.5 minutes.  Stage REM latency was 93.5 minutes.  The patient spent 4.9%% of the night in stage N1 sleep, 72.5%% in stage N2 sleep, 0.0%% in stage N3 and 22.6% in REM.  Alpha intrusion was absent.  Supine sleep was  19.60%.  RESPIRATORY PARAMETERS The overall apnea/hypopnea index (AHI) was 1.9 per hour. There were 1 total apneas, including 1 obstructive, 0 central and 0 mixed apneas. There were 8 hypopneas and 3 RERAs.  The AHI during Stage REM sleep was 5.6 per hour.  AHI while supine was 1.1 per hour.  The mean oxygen saturation was 93.8%. The minimum SpO2 during sleep was 86.0%.  Minimal snoring was noted during this study.  CARDIAC DATA The 2 lead EKG demonstrated sinus rhythm. The mean heart rate was 48.0 beats per minute. Other EKG findings include: None.  LEG MOVEMENT DATA The total PLMS were 0 with a resulting PLMS index of 0.0. Associated arousal with leg movement index was 1.1 .  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 - Mild oxygen desaturation to a nadir of 86%. - Minimal snoring was audible during this study. - No cardiac abnormalities were noted during this study. - Clinically significant periodic limb movements did not occur during sleep. No significant associated arousals.  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  - Effort should be made to optimize nasal and oropharyngeal patency. - Avoid alcohol, sedatives and other CNS depressants that may worsen sleep apnea and disrupt normal sleep architecture. - Sleep hygiene should be reviewed to assess factors that may improve sleep quality. - Weight management and regular exercise should be initiated or continued if appropriate.  [Electronically signed] 01/26/2021 11:31 AM  Nicki Guadalajara MD, [facility], ABSM Diplomate, American Board of Sleep Medicine   NPI: [id number] [facility] PH: [phone]   FX: [phone] ACCREDITED BY THE AMERICAN  ACADEMY OF SLEEP MEDICINE

## 2021-02-07 ENCOUNTER — Telehealth: Payer: Self-pay | Admitting: *Deleted

## 2021-02-07 NOTE — Telephone Encounter (Signed)
-----   Message from Lennette Bihari, MD sent at 01/26/2021 11:38 AM EDT ----- Burna Mortimer, please notify pt of the results

## 2021-02-07 NOTE — Telephone Encounter (Signed)
Patient notified of sleep study results. He understands that he has a few AHI's, however not significant enough to qualify  Him for CPAP therapy. If symptoms increase he is to notify us. May need to re evaluate.

## 2021-02-11 ENCOUNTER — Encounter: Payer: Self-pay | Admitting: Family Medicine

## 2021-02-11 ENCOUNTER — Ambulatory Visit (INDEPENDENT_AMBULATORY_CARE_PROVIDER_SITE_OTHER): Payer: Medicare Other | Admitting: Family Medicine

## 2021-02-11 ENCOUNTER — Other Ambulatory Visit: Payer: Self-pay

## 2021-02-11 VITALS — BP 118/80 | HR 83 | Temp 98.3°F | Resp 17 | Ht 67.0 in | Wt 171.8 lb

## 2021-02-11 DIAGNOSIS — I639 Cerebral infarction, unspecified: Secondary | ICD-10-CM

## 2021-02-11 DIAGNOSIS — E783 Hyperchylomicronemia: Secondary | ICD-10-CM | POA: Diagnosis not present

## 2021-02-11 DIAGNOSIS — I6930 Unspecified sequelae of cerebral infarction: Secondary | ICD-10-CM

## 2021-02-11 DIAGNOSIS — I480 Paroxysmal atrial fibrillation: Secondary | ICD-10-CM | POA: Diagnosis not present

## 2021-02-11 LAB — BASIC METABOLIC PANEL
BUN: 21 mg/dL (ref 6–23)
CO2: 27 mEq/L (ref 19–32)
Calcium: 9.9 mg/dL (ref 8.4–10.5)
Chloride: 106 mEq/L (ref 96–112)
Creatinine, Ser: 1.32 mg/dL (ref 0.40–1.50)
GFR: 53.55 mL/min — ABNORMAL LOW (ref >60.00)
Glucose, Bld: 99 mg/dL (ref 70–99)
Potassium: 4.1 mEq/L (ref 3.5–5.1)
Sodium: 142 mEq/L (ref 135–145)

## 2021-02-11 LAB — CBC WITH DIFFERENTIAL/PLATELET
Basophils Absolute: 0.1 10*3/uL (ref 0.0–0.1)
Basophils Relative: 1 % (ref 0.0–3.0)
Eosinophils Absolute: 0.1 10*3/uL (ref 0.0–0.7)
Eosinophils Relative: 2 % (ref 0.0–5.0)
HCT: 42.2 % (ref 39.0–52.0)
Hemoglobin: 13.9 g/dL (ref 13.0–17.0)
Lymphocytes Relative: 32.1 % (ref 12.0–46.0)
Lymphs Abs: 2.2 10*3/uL (ref 0.7–4.0)
MCHC: 32.9 g/dL (ref 30.0–36.0)
MCV: 90.2 fl (ref 78.0–100.0)
Monocytes Absolute: 0.5 10*3/uL (ref 0.1–1.0)
Monocytes Relative: 7.8 % (ref 3.0–12.0)
Neutro Abs: 3.9 10*3/uL (ref 1.4–7.7)
Neutrophils Relative %: 57.1 % (ref 43.0–77.0)
Platelets: 222 10*3/uL (ref 150.0–400.0)
RBC: 4.67 Mil/uL (ref 4.22–5.81)
RDW: 14.6 % (ref 11.5–15.5)
WBC: 6.8 10*3/uL (ref 4.0–10.5)

## 2021-02-11 LAB — LIPID PANEL
Cholesterol: 108 mg/dL (ref 0–200)
HDL: 38.9 mg/dL — ABNORMAL LOW (ref >39.00)
LDL Cholesterol: 45 mg/dL (ref 0–99)
NonHDL: 69.11
Total CHOL/HDL Ratio: 3
Triglycerides: 119 mg/dL (ref 0.0–149.0)
VLDL: 23.8 mg/dL (ref 0.0–40.0)

## 2021-02-11 LAB — TSH: TSH: 3.39 u[IU]/mL (ref 0.35–4.50)

## 2021-02-11 LAB — HEPATIC FUNCTION PANEL
ALT: 14 U/L (ref 0–53)
AST: 19 U/L (ref 0–37)
Albumin: 4.8 g/dL (ref 3.5–5.2)
Alkaline Phosphatase: 63 U/L (ref 39–117)
Bilirubin, Direct: 0.1 mg/dL (ref 0.0–0.3)
Total Bilirubin: 0.4 mg/dL (ref 0.2–1.2)
Total Protein: 7.5 g/dL (ref 6.0–8.3)

## 2021-02-11 NOTE — Assessment & Plan Note (Signed)
Chronic problem.  Crestor was added after his CVA so he can stop the [employer].  Suspect he will need to continue Fenofibrate but will determine based on labs.

## 2021-02-11 NOTE — Assessment & Plan Note (Signed)
Pt is now on Dilt and Eliquis and following w/ Cardiology.  Afib is thought to be cause of his January CVA.  Will follow along w/ Cards.

## 2021-02-11 NOTE — Progress Notes (Signed)
   Subjective:    Patient ID: [name] Short, male    DOB: 1946-06-13, 75 y.o.   MRN: [id number]  HPI Hyperlipidemia- chronic problem.  Pt is on Fenofibrate 160mg  daily and Crestor was added by Cardiology due to CVA.  No CP, abd pain, N/V.  Afib- paroxysmal.  On Eliquis BID and Diltiazem 120mg  for rate control.  They suspect this is what caused his stroke in January.  Denies palpitations, SOB, edema.  Cards f/u on 6/1  CVA- pt had stroke on 10/21/20.  Has been working with outpt rehab (PT/OT) since.  Pt is attempting to play tennis once weekly.  Pt has residual L facial numbness, L hand numbness and weakness.  Has neuro f/u 5/31   Review of Systems For ROS see HPI   This visit occurred during the SARS-CoV-2 public health emergency.  Safety protocols were in place, including screening questions prior to the visit, additional usage of staff PPE, and extensive cleaning of exam room while observing appropriate contact time as indicated for disinfecting solutions.       Objective:   Physical Exam Vitals reviewed.  Constitutional:      General: He is not in acute distress.    Appearance: Normal appearance. He is well-developed.  HENT:     Head: Normocephalic and atraumatic.  Eyes:     Conjunctiva/sclera: Conjunctivae normal.     Pupils: Pupils are equal, round, and reactive to light.  Neck:     Thyroid: No thyromegaly.  Cardiovascular:     Rate and Rhythm: Normal rate. Rhythm irregular.     Pulses: Normal pulses.     Heart sounds: Normal heart sounds. No murmur heard.   Pulmonary:     Effort: Pulmonary effort is normal. No respiratory distress.     Breath sounds: Normal breath sounds.  Abdominal:     General: Bowel sounds are normal. There is no distension.     Palpations: Abdomen is soft.  Musculoskeletal:     Cervical back: Normal range of motion and neck supple.     Right lower leg: No edema.     Left lower leg: No edema.  Lymphadenopathy:     Cervical: No cervical  adenopathy.  Skin:    General: Skin is warm and dry.  Neurological:     Mental Status: He is alert and oriented to person, place, and time.     Comments: Dysarthric, slowed speech L sided residual deficits  Psychiatric:        Behavior: Behavior normal.           Assessment & Plan:

## 2021-02-11 NOTE — Patient Instructions (Signed)
Follow up in 6 months to recheck cholesterol We'll notify you of your lab results and make any changes if needed Continue to work on PT/OT exercises to build your strength and stamina STOP the Red Yeast Rice Call with any questions or concerns Have a great summer!!!

## 2021-02-11 NOTE — Assessment & Plan Note (Signed)
Pt continues to have slow, dysarthric speech.  He has L sided residual deficits but has been working w/ PT/OT.  He is back to some tennis which is amazing.

## 2021-02-15 ENCOUNTER — Other Ambulatory Visit: Payer: Self-pay | Admitting: Cardiology

## 2021-02-25 NOTE — Progress Notes (Deleted)
Cardiology Office Note:    Date:  02/25/2021   ID:  [name] Short, DOB 1945/10/17, MRN [id number]  PCP:  Sheliah Hatch, MD  Cardiologist:  None  Electrophysiologist:  None   Referring MD: Sheliah Hatch, MD   No chief complaint on file.   History of Present Illness:    [name] Short is a 75 y.o. male with a hx of atrial fibrillation, CVA, hypertension, hyperlipidemia who presents for follow-up.  He was initially seen on 11/27/2020.  He has a history of paroxysmal atrial fibrillation but has not been on anticoagulation.  He presented to the ED with slurred speech and left hand weakness on 10/21/20, was found to have acute CVA.  MRI brain showed acute infarct in the posterior right frontal lobe.  He had residual left-sided weakness and dysarthria.  Echocardiogram showed normal LVEF.  He was started on Eliquis 5 mg twice daily, stroke likely secondary to atrial fibrillation.  He had paroxysmal atrial fibrillation while in the hospital, heart rate 90s to 100s while in A. fib.  He was started on Cardizem.  Zio patch x14 days showed 18% A. fib burden with longest episode lasting 1 day 7 hours, average heart rate in A. fib 80 bpm.  Also showed 5 episodes of SVT, longest lasting 18 seconds.  Since last clinic visit,  , he reports that he has been doing well.  Has been taking Eliquis, denies any bleeding issues.  Denies any chest pain, dyspnea, lightheadedness, syncope, or palpitations.  Has been having swelling in the right foot.  Started on colchicine by his Orthopedist with improvement.  Continues to to have left-sided weakness.  Wife reports that he snores and she has observed apnea events at night.   Past Medical History:  Diagnosis Date  . Arthritis    WRIST  . Benign localized prostatic hyperplasia with lower urinary tract symptoms (LUTS)   . Complication of anesthesia    slow to wake  . ED (erectile dysfunction)   . GERD (gastroesophageal reflux disease)   .  History of concussion    AS CHILD--  NO RESIDUAL  . History of gout   . History of kidney stones   . Hyperlipidemia   . Inguinal hernia, bilateral   . Nephrolithiasis    BILATERAL  . PAF (paroxysmal atrial fibrillation) (HCC) currently being followed by pcp   EPISODE --  2011  FOLLOW-UP W/ DR WALL  /  HAS NOT SEEN ANY CARDIOLOGIST SINCE    Past Surgical History:  Procedure Laterality Date  . APPENDECTOMY  1983  . CARDIOVASCULAR STRESS TEST  05-08-2010  DR WALL   NO EVIDENCE OF SCAR OR ISCHEMIA/ EF 54%/  PT HAD BOTH AFIB/ AFLUTTER DURING STUDY  . CARPAL TUNNEL RELEASE Right 01/ 14/ 2021  . CYSTOSCOPY W/ URETERAL STENT PLACEMENT Left 09/20/2013   Procedure: CYSTOSCOPY WITH STENT REPLACEMENT;  Surgeon: Antony Haste, MD;  Location: [facility];  Service: Urology;  Laterality: Left;  . CYSTOSCOPY WITH URETEROSCOPY Left 09/20/2013   Procedure: CYSTOSCOPY WITH URETEROSCOPY;  Surgeon: Antony Haste, MD;  Location: [facility];  Service: Urology;  Laterality: Left;  . CYSTOSCOPY WITH URETEROSCOPY AND STENT PLACEMENT Left 07/12/2013   Procedure: CYSTOSCOPY WITH LITHOLAPEXY, LEFT URETERAL STENT PLACEMENT;  Surgeon: Antony Haste, MD;  Location: [facility];  Service: Urology;  Laterality: Left;  . CYSTOSCOPY WITH URETEROSCOPY AND STENT PLACEMENT Bilateral 06/23/2014   Procedure: BILATERAL URETEROSCOPY, HOLMIUM LASER LITHOTRIPSY  AND STENT PLACEMENT, RIGHT ;  Surgeon: Jerilee Field, MD;  Location: [facility];  Service: Urology;  Laterality: Bilateral;  . EXTRACORPOREAL SHOCK WAVE LITHOTRIPSY  X2  . EXTRACORPOREAL SHOCK WAVE LITHOTRIPSY Left 08-29-2013;   07-28-2013  . EXTRACORPOREAL SHOCK WAVE LITHOTRIPSY Right 12/09/2018   Procedure: EXTRACORPOREAL SHOCK WAVE LITHOTRIPSY (ESWL);  Surgeon: Jerilee Field, MD;  Location: [facility];  Service: Urology;  Laterality: Right;  . HOLMIUM LASER APPLICATION Left  09/20/2013   Procedure: HOLMIUM LASER APPLICATION;  Surgeon: Antony Haste, MD;  Location: [facility];  Service: Urology;  Laterality: Left;  . INGUINAL HERNIA REPAIR Bilateral 10/16/2020   Procedure: BILATERAL OPEN INGUINAL HERNIA REPAIR WITH MESH;  Surgeon: Abigail Miyamoto, MD;  Location: [facility];  Service: General;  Laterality: Bilateral;  LMA/TAP BLOCK  . LAPAROSCOPIC INGUINAL HERNIA REPAIR Bilateral 09-04-2010   w/ mesh  . TONSILLECTOMY  AS CHILD  . 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 TRANSTHORACIC ECHOCARDIOGRAM  05-08-2010   NORMAL LVSF/  EF 55%/  GRADE I DIASTOLIC DYSFUNCTION/  MILD BILATERAL ATRIUM ENLARGEMENT    Current Medications: No outpatient medications have been marked as taking for the 02/27/21 encounter (Appointment) with Little Ishikawa, MD.     Allergies:   Shellfish-derived products and Warfarin sodium   Social History   Socioeconomic History  . Marital status: Married    Spouse name: Not on file  . Number of children: Not on file  . Years of education: Not on file  . Highest education level: Not on file  Occupational History  . Occupation: retired  Tobacco Use  . Smoking status: Light Tobacco Smoker    Years: 15.00    Types: Cigars  . Smokeless tobacco: Never Used  . Tobacco comment: AVERAGE 3 CIGARS PER WEEK  Vaping Use  . Vaping Use: Never used  Substance and Sexual Activity  . Alcohol use: Not Currently    Comment: occasional  . Drug use: No  . Sexual activity: Not on file  Other Topics Concern  . Not on file  Social History Narrative  . Not on file   Social Determinants of Health   Financial Resource Strain: Low Risk   . Difficulty of Paying Living Expenses: Not  hard at all  Food Insecurity: No Food Insecurity  . Worried About Programme researcher, broadcasting/film/video in the Last Year: Never true  . Ran Out of Food in the Last Year: Never true  Transportation Needs: No Transportation Needs  . Lack of Transportation (Medical): No  . Lack of Transportation (Non-Medical): No  Physical Activity: Sufficiently Active  . Days of Exercise per Week: 6 days  . Minutes of Exercise per Session: 60 min  Stress: No Stress Concern Present  . Feeling of Stress : Not at all  Social Connections: Moderately Isolated  . Frequency of Communication with Friends and Family: More than three times a week  . Frequency of Social Gatherings with Friends and Family: Once a week  . Attends Religious Services: Never  . Active Member of Clubs or Organizations: No  . Attends Banker Meetings: Never  . Marital Status: Married     Family History: The patient's family history includes Arthritis in his mother; Bipolar disorder in his brother;  Dementia in his brother and mother; Diabetes in his brother, maternal grandfather, maternal grandmother, mother, and sister; Heart disease in his father; Neuropathy in his brother; Prostate cancer in his father; Rheum arthritis in his mother.  ROS:   Please see the history of present illness.     All other systems reviewed and are negative.  EKGs/Labs/Other Studies Reviewed:    The following studies were reviewed today:   EKG:  EKG is ordered today.  The ekg ordered today demonstrates normal sinus rhythm, rate 65, no ST abnormalities  Recent Labs: 02/11/2021: ALT 14; BUN 21; Creatinine, Ser 1.32; Hemoglobin 13.9; Platelets 222.0; Potassium 4.1; Sodium 142; TSH 3.39  Recent Lipid Panel    Component Value Date/Time   CHOL 108 02/11/2021 0858   TRIG 119.0 02/11/2021 0858   HDL 38.90 (L) 02/11/2021 0858   CHOLHDL 3 02/11/2021 0858   VLDL 23.8 02/11/2021 0858   LDLCALC 45 02/11/2021 0858   LDLDIRECT 128.1 05/17/2012 0901    Physical Exam:     VS:  There were no vitals taken for this visit.    Wt Readings from Last 3 Encounters:  02/11/21 171 lb 12.8 oz (77.9 kg)  01/05/21 170 lb (77.1 kg)  11/27/20 170 lb 3.2 oz (77.2 kg)     GEN:  in no acute distress HEENT: Normal NECK: No JVD; No carotid bruits CARDIAC: RRR, no murmurs, rubs, gallops RESPIRATORY:  Clear to auscultation without rales, wheezing or rhonchi  ABDOMEN: Soft, non-tender, non-distended MUSCULOSKELETAL:  No edema; No deformity  SKIN: Warm and dry NEUROLOGIC:  Alert and oriented x 3 PSYCHIATRIC:  Normal affect   ASSESSMENT:    No diagnosis found. PLAN:    Paroxysmal atrial fibrillation: CHA2DS2-VASc score 4 (age, hypertension, CVA x2).  Echocardiogram 10/23/2020 showed normal biventricular function, no significant valvular disease.  Zio patch x14 days showed 18% A. fib burden with longest episode lasting 1 day 7 hours, average heart rate in A. fib 80 bpm.  Also showed 5 episodes of SVT, longest lasting 18 seconds. -Continue Eliquis 5 mg bid.  Eliquis is Tier 3 medication on his insurance plan; warfarin is Tier 1 but he has a warfarin allergy (rash).  Will discuss with pharmacy -Continue diltiazem 120 mg daily -Sleep study shows mild OSA, no indication for CPAP at this time  CVA: Presented 10/21/2020 with left-sided weakness, found to have acute CVA.  Likely due to A. fib as above.  Continue Eliquis, statin  Hyperlipidemia: LDL 98 on 10/22/2020, started on rosuvastatin 20 mg daily.  Goal LDL less than 70.    Hypertension: On diltiazem 120 mg daily.  Appears controlled.    Snoring/observed apnea events: Sleep study shows mild OSA, no indication for CPAP at this time  RTC in ***  Medication Adjustments/Labs and Tests Ordered: Current medicines are reviewed at length with the patient today.  Concerns regarding medicines are outlined above.  No orders of the defined types were placed in this encounter.  No orders of the defined types were placed in this  encounter.   There are no Patient Instructions on file for this visit.   Signed, Little Ishikawa, MD  02/25/2021 11:40 AM    [facility]

## 2021-02-26 ENCOUNTER — Other Ambulatory Visit: Payer: Self-pay

## 2021-02-26 ENCOUNTER — Encounter: Payer: Self-pay | Admitting: Adult Health

## 2021-02-26 ENCOUNTER — Ambulatory Visit (INDEPENDENT_AMBULATORY_CARE_PROVIDER_SITE_OTHER): Payer: Medicare Other | Admitting: Adult Health

## 2021-02-26 VITALS — BP 132/84 | HR 60 | Ht 67.0 in | Wt 172.0 lb

## 2021-02-26 DIAGNOSIS — R200 Anesthesia of skin: Secondary | ICD-10-CM | POA: Diagnosis not present

## 2021-02-26 DIAGNOSIS — I639 Cerebral infarction, unspecified: Secondary | ICD-10-CM | POA: Diagnosis not present

## 2021-02-26 DIAGNOSIS — I10 Essential (primary) hypertension: Secondary | ICD-10-CM | POA: Diagnosis not present

## 2021-02-26 DIAGNOSIS — E785 Hyperlipidemia, unspecified: Secondary | ICD-10-CM | POA: Diagnosis not present

## 2021-02-26 DIAGNOSIS — I480 Paroxysmal atrial fibrillation: Secondary | ICD-10-CM

## 2021-02-26 NOTE — Progress Notes (Signed)
[facility] [address] [place]. [place] [address] [phone]       STROKE FOLLOW UP NOTE  Mr. JEHU MCCAUSLIN Date of Birth:  12/20/1945 Medical Record Number:  [id number]   Reason for Referral: stroke follow up    SUBJECTIVE:   CHIEF COMPLAINT:  Chief Complaint  Patient presents with  . Follow-up    Rm 14 with spouse dawn PT is well, still has some L side weakness including in the face, slurred speech.     HPI:   Today, 02/26/2021, Mr. Harter returns for 74-month stroke follow-up accompanied by his wife  Doing well since prior visit without new stroke/TIA symptoms Residual left hand weakness with sensory impairment, left facial weakness and mild dysarthria with improvement since prior visit and has since completed all therapies.  Concern of left hand sensory impairment possible residual stroke symptoms vs carpal tunnel.  Initially experienced numbness throughout his entire left hand and now only experiencing symptoms in his thumb, index finger and middle finger which can worsen with activity.  Denies any painful associated symptoms.  He did not experience any symptoms prior to his stroke.  He does have history of right carpal tunnel release in 03/2020.   Compliant on Eliquis and Crestor without associated side effects Blood pressure today 132/84  Also reports chronic lower back pain which does not radiate into his legs or denies sciatica type symptoms.  Used to treat pain with ibuprofen but has not been able to take since starting Eliquis.  Denies any progressive or worsening symptoms.  No further concerns at this time    History provided for reference purposes only Initial visit 11/26/2020 JM: Mr. Agrusa is being seen for hospital follow-up accompanied by his wife.  Reports residual dysarthria (slight), left sided facial weakness, and LUE/hand weakness Currently working with [employer] PT/OT/SLP slowly progressing towards goals Initial  improvement of ambulation since discharge and able to ambulate without AD but last week dx'd with right foot gout which impacted his ambulation and is currently using RW - gout pain has been slowly improving He does question potential return to driving Denies new or worsening stroke/TIA symptoms  Compliance on Eliquis 5 mg twice daily without bleeding or bruising Compliant on Crestor 20 mg daily without myalgias Blood pressure today 123/77 - does not routinely monitor at home as typically stable  Follow-up visit scheduled with cardiology tomorrow No follow-up with PCP since discharge  No further concerns at this time  Stroke admission 10/21/2020 Benney Humphreyis a 75 y.o.malewith medical history significant ofatrial fibrillation (not on AC), HLD, gout, OA, BPH, GERD. He had bilateral inquinal hernia repair 10/16/20. EKG 10/16/20 revealed A. Fib. On 10/21/2020, he presented with slurred speech and left  hand weakness that started on awakening from a nap.  Personally reviewed hospitalization pertinent progress notes, lab work and imaging with summary provided. Evaluated by Dr. 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 Initially diagnosed with PAF 2011 during stress test asymptomatic therefore placed on aspirin. Recommended initiating Eliquis 5 mg twice daily in setting of recent embolic stroke. LDL 98 and initiated Crestor 20 mg daily. A1c 5.6. Other stroke risk factors include advanced age and cigar smoker x15 yrs. Residual deficits of mild dysarthria, left facial weakness, and LUE weakness. Evaluated by therapies who recommended discharge home with outpatient PT/OT/SLP.  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   CTA neck No acute vascular finding within the neck.  CTA head High-grade narrowing of  right M3 branch with non opacification Distally.  CT Perfusion Acute right frontal infarct, core of 6 mL.  MRI Moderate acute infarct in the posterior right frontal lobe.   2D Echo EF 60 to 65%. In afib at time of study  On aspirin 81 mg PTA. Now on Eliquis   BID  in setting of recent embolic stroke  LDL 98  HgbA1c 5.6  Therapy recommendations: Outpatient PT/OT/SLP  Disposition:  home     ROS:   14 system review of systems performed and negative with exception of those listed in HPI  PMH:  Past Medical History:  Diagnosis Date  . Arthritis    WRIST  . Benign localized prostatic hyperplasia with lower urinary tract symptoms (LUTS)   . Complication of anesthesia    slow to wake  . ED (erectile dysfunction)   . GERD (gastroesophageal reflux disease)   . History of concussion    AS CHILD--  NO RESIDUAL  . History of gout   . History of kidney stones   . Hyperlipidemia   . Inguinal hernia, bilateral   . Nephrolithiasis    BILATERAL  . PAF (paroxysmal atrial fibrillation) (HCC) currently being followed by pcp   EPISODE --  2011  FOLLOW-UP W/ DR WALL  /  HAS NOT SEEN ANY CARDIOLOGIST SINCE    PSH:  Past Surgical History:  Procedure Laterality Date  . APPENDECTOMY  1983  . CARDIOVASCULAR STRESS TEST  05-08-2010  DR WALL   NO EVIDENCE OF SCAR OR ISCHEMIA/ EF 54%/  PT HAD BOTH AFIB/ AFLUTTER DURING STUDY  . CARPAL TUNNEL RELEASE Right 01/ 14/ 2021  . CYSTOSCOPY W/ URETERAL STENT PLACEMENT Left 09/20/2013   Procedure: CYSTOSCOPY WITH STENT REPLACEMENT;  Surgeon: Antony Haste, MD;  Location: [facility];  Service: Urology;  Laterality: Left;  . CYSTOSCOPY WITH URETEROSCOPY Left 09/20/2013   Procedure: CYSTOSCOPY WITH URETEROSCOPY;  Surgeon: Antony Haste, MD;  Location: [facility];  Service: Urology;  Laterality: Left;  . CYSTOSCOPY WITH URETEROSCOPY AND STENT PLACEMENT Left 07/12/2013   Procedure: CYSTOSCOPY WITH  LITHOLAPEXY, LEFT URETERAL STENT PLACEMENT;  Surgeon: Antony Haste, MD;  Location: [facility];  Service: Urology;  Laterality: Left;  . CYSTOSCOPY WITH URETEROSCOPY AND STENT PLACEMENT Bilateral 06/23/2014   Procedure: BILATERAL URETEROSCOPY, HOLMIUM LASER LITHOTRIPSY AND STENT PLACEMENT, RIGHT ;  Surgeon: Jerilee Field, MD;  Location: [facility];  Service: Urology;  Laterality: Bilateral;  . EXTRACORPOREAL SHOCK WAVE LITHOTRIPSY  X2  . EXTRACORPOREAL SHOCK WAVE LITHOTRIPSY Left 08-29-2013;   07-28-2013  . EXTRACORPOREAL SHOCK WAVE LITHOTRIPSY Right 12/09/2018   Procedure: EXTRACORPOREAL SHOCK WAVE LITHOTRIPSY (ESWL);  Surgeon: Jerilee Field, MD;  Location: [facility];  Service: Urology;  Laterality: Right;  . HOLMIUM LASER APPLICATION Left 09/20/2013   Procedure: HOLMIUM LASER APPLICATION;  Surgeon: Antony Haste, MD;  Location: [facility];  Service: Urology;  Laterality: Left;  . INGUINAL HERNIA REPAIR Bilateral 10/16/2020   Procedure: BILATERAL OPEN INGUINAL HERNIA REPAIR WITH MESH;  Surgeon: Abigail Miyamoto, MD;  Location: [facility];  Service: General;  Laterality: Bilateral;  LMA/TAP BLOCK  . LAPAROSCOPIC INGUINAL HERNIA REPAIR Bilateral 09-04-2010   w/ mesh  . TONSILLECTOMY  AS CHILD  . 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 TOTAL HIP ARTHROPLASTY Right  11/15/2012   Procedure: RIGHT TOTAL HIP ARTHROPLASTY ANTERIOR APPROACH;  Surgeon: Eldred Manges, MD;  Location: [facility];  Service: Orthopedics;  Laterality: Right;  Right total hip arthroplasty  . TRANSTHORACIC ECHOCARDIOGRAM  05-08-2010   NORMAL LVSF/  EF 55%/  GRADE I DIASTOLIC DYSFUNCTION/  MILD BILATERAL ATRIUM ENLARGEMENT    Social History:  Social History   Socioeconomic History  . Marital status: Married    Spouse name: Not on file  . Number of children: Not on file  . Years of education: Not on file  . Highest education level: Not on file   Occupational History  . Occupation: retired  Tobacco Use  . Smoking status: Light Tobacco Smoker    Years: 15.00    Types: Cigars  . Smokeless tobacco: Never Used  . Tobacco comment: AVERAGE 3 CIGARS PER WEEK  Vaping Use  . Vaping Use: Never used  Substance and Sexual Activity  . Alcohol use: Not Currently    Comment: occasional  . Drug use: No  . Sexual activity: Not on file  Other Topics Concern  . Not on file  Social History Narrative  . Not on file   Social Determinants of Health   Financial Resource Strain: Low Risk   . Difficulty of Paying Living Expenses: Not hard at all  Food Insecurity: No Food Insecurity  . Worried About Programme researcher, broadcasting/film/video in the Last Year: Never true  . Ran Out of Food in the Last Year: Never true  Transportation Needs: No Transportation Needs  . Lack of Transportation (Medical): No  . Lack of Transportation (Non-Medical): No  Physical Activity: Sufficiently Active  . Days of Exercise per Week: 6 days  . Minutes of Exercise per Session: 60 min  Stress: No Stress Concern Present  . Feeling of Stress : Not at all  Social Connections: Moderately Isolated  . Frequency of Communication with Friends and Family: More than three times a week  . Frequency of Social Gatherings with Friends and Family: Once a week  . Attends Religious Services: Never  . Active Member of Clubs or Organizations: No  . Attends Banker Meetings: Never  . Marital Status: Married  Catering manager Violence: Not At Risk  . Fear of Current or Ex-Partner: No  . Emotionally Abused: No  . Physically Abused: No  . Sexually Abused: No    Family History:  Family History  Problem Relation Age of Onset  . Diabetes Mother   . Arthritis Mother   . Rheum arthritis Mother   . Dementia Mother   . Heart disease Father   . Prostate cancer Father   . Diabetes Sister   . Diabetes Maternal Grandmother   . Diabetes Maternal Grandfather   . Bipolar disorder Brother    . Diabetes Brother   . Dementia Brother   . Neuropathy Brother     Medications:   Current Outpatient Medications on File Prior to Visit  Medication Sig Dispense Refill  . acetaminophen (TYLENOL) 500 MG tablet Take 1 tablet (500 mg total) by mouth every 6 (six) hours as needed. 30 tablet 0  . apixaban (ELIQUIS) 5 MG TABS tablet Take 1 tablet (5 mg total) by mouth 2 (two) times daily. 180 tablet 1  . calcium carbonate (TUMS - DOSED IN MG ELEMENTAL CALCIUM) 500 MG chewable tablet Chew 1 tablet by mouth as needed for indigestion or heartburn.    . Cimetidine (TAGAMET PO) Take 1 tablet by mouth daily as needed (  for reflux).    . Colchicine 0.6 MG CAPS Take 0.6 mg by mouth daily as needed (for gout flares).    Marland Kitchen diltiazem (CARDIZEM CD) 120 MG 24 hr capsule Take 1 capsule (120 mg total) by mouth daily. Pt must keep upcoming appt in June for further refills 30 capsule 0  . fenofibrate 160 MG tablet TAKE 1 TABLET BY MOUTH  DAILY (Patient taking differently: Take 160 mg by mouth in the morning.) 90 tablet 3  . Multiple Vitamin (MULTIVITAMIN WITH MINERALS) TABS Take 1 tablet by mouth daily.    Marland Kitchen oxyCODONE (OXY IR/ROXICODONE) 5 MG immediate release tablet Take 1 tablet (5 mg total) by mouth every 6 (six) hours as needed for moderate pain or severe pain. 30 tablet 0  . Polyethylene Glycol 400 (BLINK TEARS) 0.25 % SOLN Place 1 drop into both eyes 2 (two) times daily as needed (for dryness or irritation).    . rosuvastatin (CRESTOR) 20 MG tablet Take 1 tablet (20 mg total) by mouth daily. Pt must keep upcoming appt in June for further refills 30 tablet 0  . SHINGRIX injection     . tadalafil (CIALIS) 5 MG tablet Take 5 mg by mouth daily as needed for erectile dysfunction.    . tamsulosin (FLOMAX) 0.4 MG CAPS capsule Take 0.4 mg by mouth at bedtime as needed (for urinary issues).     No current facility-administered medications on file prior to visit.    Allergies:   Allergies  Allergen Reactions  .  Shellfish-Derived Products Other (See Comments)    Gout   . Warfarin Sodium Rash      OBJECTIVE:  Physical Exam  Vitals:   02/26/21 1411  BP: 132/84  Pulse: 60  Weight: 172 lb (78 kg)  Height: 5\' 7"  (1.702 m)   Body mass index is 26.94 kg/m. No exam data present  General: well developed, well nourished,  very pleasant elderly Caucasian male, seated, in no evident distress Head: head normocephalic and atraumatic.   Neck: supple with no carotid or supraclavicular bruits Cardiovascular: regular rate and rhythm, no murmurs Musculoskeletal: no deformity Skin:  no rash/petichiae Vascular:  Normal pulses all extremities   Neurologic Exam Mental Status: Awake and fully alert.  Very slight dysarthria and mild hypophonia.  Oriented to place and time. Recent and remote memory intact. Attention span, concentration and fund of knowledge appropriate. Mood and affect appropriate.  Cranial Nerves: Pupils equal, briskly reactive to light. Extraocular movements full without nystagmus. Visual fields full to confrontation. Hearing intact. Facial sensation intact.  Complete left-sided facial weakness including eyelid, eyebrow and forehead.  Tongue, and palate moves normally and symmetrically.  Motor: Normal bulk and tone. Normal strength in all tested extremity muscles except slightly decreased left hand grip strength and decreased dexterity Sensory.:  Decreased pinprick sensation left pointer finger and index finger distally otherwise all other extremities intact to touch , pinprick , position and vibratory sensation.  Negative left sided Tinel's sign.  Mildly positive left Phalen sign Coordination: Rapid alternating movements normal in all extremities except slightly decreased left hand Dexterity. Finger-to-nose mild dysmetria LUE and heel-to-shin performed accurately bilaterally. Gait and Station: Arises from chair without difficulty. Stance is normal. Gait demonstrates normal stride length and  balance without use of assistive device.  Tandem walk and heel toe with mild difficulty Reflexes: 1+ and symmetric. Toes downgoing.         ASSESSMENT: MICHAELJAMES MILNES is a 75 y.o. 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 Vascular risk factors include PAF not on AC, HLD, advanced age, and cigar smoker.      PLAN:  1. R frontal stroke, embolic:  a. Residual deficit: Left hand weakness with sensory impairment, mild dysarthria and left facial weakness.  Since completed therapies and encouraged continued exercises at home but overall making great progress. He also has complete left sided facial weakness which he reports has been present since his stroke - concern of possible Bell's palsy presentation - will further discuss with Dr. Roda ShuttersXu who saw him in hospital to see if any additional evaluation such as MR brain w/wo contrast warranted at this time.  As symptoms are not acute and present since 09/2020, use of steroids or antiviral's not recommended at this time. b. Continue Eliquis (apixaban) daily  and Crestor for secondary stroke prevention.   c. Discussed secondary stroke prevention measures and importance of close PCP follow up for aggressive stroke risk factor management  2. Left hand numbness: Possibly post stroke vs median nerve compression.  Recommend EMG/NCV for further evaluation and will refer if indicated 3. PAF: On Eliquis 5 mg twice daily for CHA2DS2-VASc score of at least 3.  Continue to follow with cardiology 4. HTN: BP goal <130/90.  Well-controlled monitored by PCP 5. HLD: LDL goal <70. Currently on Crestor 20 mg daily with recent LDL 45 managed and monitored by PCP   Follow up in 6 months or call earlier if needed   CC:  [facility] provider: Dr. Pearlean BrownieSethi PCP: Sheliah Hatchabori, Katherine E, MD    Ihor AustinJessica McCue, [facility] Regional Medical CenterGNP-BC  [facility] [address] [place], [place] [id number]  Phone [phone] Fax [phone] Note: This document was prepared with digital dictation and possible smart phrase technology. Any transcriptional errors that result from this process are unintentional.

## 2021-02-26 NOTE — Patient Instructions (Signed)
Order placed for EMG/NCV to further assess left hand symptoms to help distinguish between residual stroke symptoms vs possible carpal tunnel syndrome  Continue Eliquis (apixaban) daily  and Crestor  for secondary stroke prevention  Continue to follow up with PCP regarding cholesterol and blood pressure management  Maintain strict control of hypertension with blood pressure goal below 130/90 and cholesterol with LDL cholesterol (bad cholesterol) goal below 70 mg/dL.       Followup in the future with me in 6 months or call earlier if needed       Thank you for coming to see [place] at [facility]. I hope we have been able to provide you high quality care today.  You may receive a patient satisfaction survey over the next few weeks. We would appreciate your feedback and comments so that we may continue to improve ourselves and the health of our patients.

## 2021-02-27 ENCOUNTER — Other Ambulatory Visit: Payer: Self-pay

## 2021-02-27 ENCOUNTER — Ambulatory Visit (INDEPENDENT_AMBULATORY_CARE_PROVIDER_SITE_OTHER): Payer: Medicare Other | Admitting: Cardiology

## 2021-02-27 ENCOUNTER — Encounter: Payer: Self-pay | Admitting: Cardiology

## 2021-02-27 VITALS — BP 130/70 | HR 52 | Ht 67.0 in | Wt 171.2 lb

## 2021-02-27 DIAGNOSIS — I639 Cerebral infarction, unspecified: Secondary | ICD-10-CM

## 2021-02-27 DIAGNOSIS — I480 Paroxysmal atrial fibrillation: Secondary | ICD-10-CM

## 2021-02-27 DIAGNOSIS — E785 Hyperlipidemia, unspecified: Secondary | ICD-10-CM

## 2021-02-27 DIAGNOSIS — I10 Essential (primary) hypertension: Secondary | ICD-10-CM

## 2021-02-27 MED ORDER — METOPROLOL SUCCINATE ER 25 MG PO TB24
25.0000 mg | ORAL_TABLET | Freq: Every day | ORAL | 3 refills | Status: DC
Start: 2021-02-27 — End: 2022-03-07

## 2021-02-27 MED ORDER — ROSUVASTATIN CALCIUM 20 MG PO TABS
20.0000 mg | ORAL_TABLET | Freq: Every day | ORAL | 3 refills | Status: DC
Start: 2021-02-27 — End: 2022-03-07

## 2021-02-27 NOTE — Patient Instructions (Signed)
Medication Instructions:  STOP diltiazem START metoprolol succinate (Toprol XL) 25 mg daily  *If you need a refill on your cardiac medications before your next appointment, please call your pharmacy*  Follow-Up: At [facility], you and your health needs are our priority.  As part of our continuing mission to provide you with exceptional heart care, we have created designated Provider Care Teams.  These Care Teams include your primary Cardiologist (physician) and Advanced Practice Providers (APPs -  Physician Assistants and Nurse Practitioners) who all work together to provide you with the care you need, when you need it.  We recommend signing up for the patient portal called "MyChart".  Sign up information is provided on this After Visit Summary.  MyChart is used to connect with patients for Virtual Visits (Telemedicine).  Patients are able to view lab/test results, encounter notes, upcoming appointments, etc.  Non-urgent messages can be sent to your provider as well.   To learn more about what you can do with MyChart, go to [email or web address].    Your next appointment:   6 month(s)  The format for your next appointment:   In Person  Provider:   Epifanio Lesches, MD

## 2021-02-27 NOTE — Progress Notes (Signed)
Cardiology Office Note:    Date:  02/27/2021   ID:  [name] Short, DOB 16-Jun-1946, MRN [id number]  PCP:  Sheliah Hatch, MD  Cardiologist:  None  Electrophysiologist:  None   Referring MD: Sheliah Hatch, MD   Chief Complaint  Patient presents with  . Atrial Fibrillation    History of Present Illness:    [name] Short is a 75 y.o. male with a hx of atrial fibrillation, CVA, hypertension, hyperlipidemia who presents for follow-up.  He was initially seen on 11/27/2020.  He has a history of paroxysmal atrial fibrillation but has not been on anticoagulation.  He presented to the ED with slurred speech and left hand weakness on 10/21/20, was found to have acute CVA.  MRI brain showed acute infarct in the posterior right frontal lobe.  He had residual left-sided weakness and dysarthria.  Echocardiogram showed normal LVEF.  He was started on Eliquis 5 mg twice daily, stroke likely secondary to atrial fibrillation.  He had paroxysmal atrial fibrillation while in the hospital, heart rate 90s to 100s while in A. fib.  He was started on Cardizem.  Zio patch x14 days showed 18% A. fib burden with longest episode lasting 1 day 7 hours, average heart rate in A. fib 80 bpm.  Also showed 5 episodes of SVT, longest lasting 18 seconds.  He is accompanied by his wife today, who also provides some history. Since last clinic visit, he believes he is improving overall, though still not at his baseline. Generally he feels fatigued. He has never really known when he is in Afib. Sometimes, his urine is darker in color, and is unsure if this hematuria or is attributable to his history of kidney stones. For activity he is back to playing doubles tennis once a week, and he feels well during exertion with breaks. He takes colchicine only when necessary, such as when he develops gout. However, he would only take it 3 days at a time because it causes digestive issues as a side effect. Occasionally, minor  trauma to his right foot such as tying his shoe too tightly will cause edema, and he will take the colchizine. He denies any chest pain, shortness of breath, or palpitations. No headaches, lightheadedness, or syncope to report. Also has no orthopnea or PND. Due to recent issues with getting his medications he asks for new prescriptions to be for 90 days. Tylenol is giving him no relief for lower back pain, and he will sometimes use ibuprofen despite taking Eliquis.    Past Medical History:  Diagnosis Date  . Arthritis    WRIST  . Benign localized prostatic hyperplasia with lower urinary tract symptoms (LUTS)   . Complication of anesthesia    slow to wake  . ED (erectile dysfunction)   . GERD (gastroesophageal reflux disease)   . History of concussion    AS CHILD--  NO RESIDUAL  . History of gout   . History of kidney stones   . Hyperlipidemia   . Inguinal hernia, bilateral   . Nephrolithiasis    BILATERAL  . PAF (paroxysmal atrial fibrillation) (HCC) currently being followed by pcp   EPISODE --  2011  FOLLOW-UP W/ DR WALL  /  HAS NOT SEEN ANY CARDIOLOGIST SINCE    Past Surgical History:  Procedure Laterality Date  . APPENDECTOMY  1983  . CARDIOVASCULAR STRESS TEST  05-08-2010  DR WALL   NO EVIDENCE OF SCAR OR ISCHEMIA/ EF 54%/  PT HAD BOTH  AFIB/ AFLUTTER DURING STUDY  . CARPAL TUNNEL RELEASE Right 01/ 14/ 2021  . CYSTOSCOPY W/ URETERAL STENT PLACEMENT Left 09/20/2013   Procedure: CYSTOSCOPY WITH STENT REPLACEMENT;  Surgeon: Antony Haste, MD;  Location: [facility];  Service: Urology;  Laterality: Left;  . CYSTOSCOPY WITH URETEROSCOPY Left 09/20/2013   Procedure: CYSTOSCOPY WITH URETEROSCOPY;  Surgeon: Antony Haste, MD;  Location: [facility];  Service: Urology;  Laterality: Left;  . CYSTOSCOPY WITH URETEROSCOPY AND STENT PLACEMENT Left 07/12/2013   Procedure: CYSTOSCOPY WITH LITHOLAPEXY, LEFT URETERAL STENT PLACEMENT;  Surgeon:  Antony Haste, MD;  Location: [facility];  Service: Urology;  Laterality: Left;  . CYSTOSCOPY WITH URETEROSCOPY AND STENT PLACEMENT Bilateral 06/23/2014   Procedure: BILATERAL URETEROSCOPY, HOLMIUM LASER LITHOTRIPSY AND STENT PLACEMENT, RIGHT ;  Surgeon: Jerilee Field, MD;  Location: [facility];  Service: Urology;  Laterality: Bilateral;  . EXTRACORPOREAL SHOCK WAVE LITHOTRIPSY  X2  . EXTRACORPOREAL SHOCK WAVE LITHOTRIPSY Left 08-29-2013;   07-28-2013  . EXTRACORPOREAL SHOCK WAVE LITHOTRIPSY Right 12/09/2018   Procedure: EXTRACORPOREAL SHOCK WAVE LITHOTRIPSY (ESWL);  Surgeon: Jerilee Field, MD;  Location: [facility];  Service: Urology;  Laterality: Right;  . HOLMIUM LASER APPLICATION Left 09/20/2013   Procedure: HOLMIUM LASER APPLICATION;  Surgeon: Antony Haste, MD;  Location: [facility];  Service: Urology;  Laterality: Left;  . INGUINAL HERNIA REPAIR Bilateral 10/16/2020   Procedure: BILATERAL OPEN INGUINAL HERNIA REPAIR WITH MESH;  Surgeon: Abigail Miyamoto, MD;  Location: [facility];  Service: General;  Laterality: Bilateral;  LMA/TAP BLOCK  . LAPAROSCOPIC INGUINAL HERNIA REPAIR Bilateral 09-04-2010   w/ mesh  . TONSILLECTOMY  AS CHILD  . 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 TRANSTHORACIC ECHOCARDIOGRAM  05-08-2010   NORMAL LVSF/  EF 55%/  GRADE I DIASTOLIC DYSFUNCTION/  MILD BILATERAL ATRIUM ENLARGEMENT    Current Medications: Current Meds  Medication Sig  . acetaminophen (TYLENOL) 500 MG tablet Take 1 tablet (500 mg total) by mouth every 6 (six) hours as needed.  Marland Kitchen apixaban (ELIQUIS) 5 MG TABS tablet Take 1 tablet (5 mg total) by mouth 2 (two) times daily.  . calcium carbonate  (TUMS - DOSED IN MG ELEMENTAL CALCIUM) 500 MG chewable tablet Chew 1 tablet by mouth as needed for indigestion or heartburn.  . Cimetidine (TAGAMET PO) Take 1 tablet by mouth daily as needed (for reflux).  . Colchicine 0.6 MG CAPS Take 0.6 mg by mouth daily as needed (for gout flares).  . fenofibrate 160 MG tablet TAKE 1 TABLET BY MOUTH  DAILY (Patient taking differently: Take 160 mg by mouth in the morning.)  . Multiple Vitamin (MULTIVITAMIN WITH MINERALS) TABS Take 1 tablet by mouth daily.  Marland Kitchen oxyCODONE (OXY IR/ROXICODONE) 5 MG immediate release tablet Take 1 tablet (5 mg total) by mouth every 6 (six) hours as needed for moderate pain or severe pain.  . Polyethylene Glycol 400 (BLINK TEARS) 0.25 % SOLN Place 1 drop into both eyes 2 (two) times daily as needed (for dryness or irritation).  . rosuvastatin (CRESTOR) 20 MG tablet Take 1 tablet (20 mg total) by mouth daily. Pt must keep upcoming appt in June for further refills  . tadalafil (CIALIS) 5 MG tablet Take 5 mg  by mouth daily as needed for erectile dysfunction.  . tamsulosin (FLOMAX) 0.4 MG CAPS capsule Take 0.4 mg by mouth at bedtime as needed (for urinary issues).  . [DISCONTINUED] diltiazem (CARDIZEM CD) 120 MG 24 hr capsule Take 1 capsule (120 mg total) by mouth daily. Pt must keep upcoming appt in June for further refills  . [DISCONTINUED] SHINGRIX injection      Allergies:   Shellfish-derived products and Warfarin sodium   Social History   Socioeconomic History  . Marital status: Married    Spouse name: Not on file  . Number of children: Not on file  . Years of education: Not on file  . Highest education level: Not on file  Occupational History  . Occupation: retired  Tobacco Use  . Smoking status: Light Tobacco Smoker    Years: 15.00    Types: Cigars  . Smokeless tobacco: Never Used  . Tobacco comment: AVERAGE 3 CIGARS PER WEEK  Vaping Use  . Vaping Use: Never used  Substance and Sexual Activity  . Alcohol use: Not  Currently    Comment: occasional  . Drug use: No  . Sexual activity: Not on file  Other Topics Concern  . Not on file  Social History Narrative  . Not on file   Social Determinants of Health   Financial Resource Strain: Low Risk   . Difficulty of Paying Living Expenses: Not hard at all  Food Insecurity: No Food Insecurity  . Worried About Programme researcher, broadcasting/film/videounning Out of Food in the Last Year: Never true  . Ran Out of Food in the Last Year: Never true  Transportation Needs: No Transportation Needs  . Lack of Transportation (Medical): No  . Lack of Transportation (Non-Medical): No  Physical Activity: Sufficiently Active  . Days of Exercise per Week: 6 days  . Minutes of Exercise per Session: 60 min  Stress: No Stress Concern Present  . Feeling of Stress : Not at all  Social Connections: Moderately Isolated  . Frequency of Communication with Friends and Family: More than three times a week  . Frequency of Social Gatherings with Friends and Family: Once a week  . Attends Religious Services: Never  . Active Member of Clubs or Organizations: No  . Attends BankerClub or Organization Meetings: Never  . Marital Status: Married     Family History: The patient's family history includes Arthritis in his mother; Bipolar disorder in his brother; Dementia in his brother and mother; Diabetes in his brother, maternal grandfather, maternal grandmother, mother, and sister; Heart disease in his father; Neuropathy in his brother; Prostate cancer in his father; Rheum arthritis in his mother.  ROS:   Please see the history of present illness.    (+) Fatigue (+) Occasional LE edema (+) Lower back pain (+) Dark-colored Urine All other systems reviewed and are negative.  EKGs/Labs/Other Studies Reviewed:    The following studies were reviewed today:  14-Day Monitor 01/03/2021:  18% A. fib burden, longest episode lasting 1 day 7 hours. Average heart rate in A. fib 80 bpm  5 episodes of SVT, longest lasting 18  seconds with average rate 94 bpm   Patch Wear Time:  14 days and 0 hours (2022-03-17T12:53:47-0400 to 2022-03-31T12:53:51-0400)  Patient had a min HR of 41 bpm, max HR of 144 bpm, and avg HR of 61 bpm. Predominant underlying rhythm was Sinus Rhythm. 5 Supraventricular Tachycardia runs occurred, the run with the fastest interval lasting 14 beats with a max rate of 133 bpm, the  longest lasting 17.9 secs with an avg rate of 94 bpm. Atrial Fibrillation occurred (18% burden), ranging from 49-144 bpm (avg of 80 bpm), the longest lasting 1 day 7 hours with an avg rate of 83 bpm. Isolated SVEs were rare (<1.0%), SVE Couplets were  rare (<1.0%), and SVE Triplets were rare (<1.0%). Isolated VEs were rare (<1.0%), VE Couplets were rare (<1.0%), and no VE Triplets were present.  No patient triggered events.  Echo 10/23/2020: 1. Pt in atrial fibrillation at time of study.  2. Left ventricular ejection fraction, by estimation, is 60 to 65%. The  left ventricle has normal function. The left ventricle has no regional  wall motion abnormalities. There is mild left ventricular hypertrophy.  Left ventricular diastolic function  could not be evaluated.  3. Right ventricular systolic function is normal. The right ventricular  size is normal. Tricuspid regurgitation signal is inadequate for assessing  PA pressure.  4. The mitral valve is normal in structure. Trivial mitral valve  regurgitation. No evidence of mitral stenosis.  5. The aortic valve is tricuspid. Aortic valve regurgitation is trivial.  No aortic stenosis is present.  6. The inferior vena cava is normal in size with greater than 50%  respiratory variability, suggesting right atrial pressure of 3 mmHg.   EKG:    02/27/2021: normal sinus rhythm, rate 52, Nonspecific T wave flattening 11/27/2020: normal sinus rhythm, rate 65, no ST abnormalities  Recent Labs: 02/11/2021: ALT 14; BUN 21; Creatinine, Ser 1.32; Hemoglobin 13.9; Platelets 222.0;  Potassium 4.1; Sodium 142; TSH 3.39  Recent Lipid Panel    Component Value Date/Time   CHOL 108 02/11/2021 0858   TRIG 119.0 02/11/2021 0858   HDL 38.90 (L) 02/11/2021 0858   CHOLHDL 3 02/11/2021 0858   VLDL 23.8 02/11/2021 0858   LDLCALC 45 02/11/2021 0858   LDLDIRECT 128.1 05/17/2012 0901    Physical Exam:    VS:  BP 130/70   Pulse (!) 52   Ht 5\' 7"  (1.702 m)   Wt 171 lb 3.2 oz (77.7 kg)   SpO2 96%   BMI 26.81 kg/m     Wt Readings from Last 3 Encounters:  02/27/21 171 lb 3.2 oz (77.7 kg)  02/26/21 172 lb (78 kg)  02/11/21 171 lb 12.8 oz (77.9 kg)     GEN:  in no acute distress HEENT: Normal NECK: No JVD; No carotid bruits CARDIAC: RRR, no murmurs, rubs, gallops RESPIRATORY:  Clear to auscultation without rales, wheezing or rhonchi  ABDOMEN: Soft, non-tender, non-distended MUSCULOSKELETAL:  No edema; No deformity  SKIN: Warm and dry NEUROLOGIC:  Alert and oriented x 3 PSYCHIATRIC:  Normal affect   ASSESSMENT:    1. Paroxysmal atrial fibrillation (HCC)   2. Cerebrovascular accident (CVA), unspecified mechanism (HCC)   3. Essential hypertension   4. Hyperlipidemia, unspecified hyperlipidemia type    PLAN:    Paroxysmal atrial fibrillation: CHA2DS2-VASc score 4 (age, hypertension, CVA x2).  Echocardiogram 10/23/2020 showed normal biventricular function, no significant valvular disease.  Zio patch x14 days showed 18% A. fib burden with longest episode lasting 1 day 7 hours, average heart rate in A. fib 80 bpm.  Also showed 5 episodes of SVT, longest lasting 18 seconds. -Continue Eliquis 5 mg bid -He has been intermittently taking colchicine to treat gout flares.  Would favor switching from diltiazem to metoprolol to avoid interaction with colchicine.  Will stop diltiazem and start Toprol-XL 25 mg daily -Sleep study shows mild OSA, no indication for CPAP at this  time  CVA: Presented 10/21/2020 with left-sided weakness, found to have acute CVA.  Likely due to A. fib as  above.  Continue Eliquis, statin  Hyperlipidemia: LDL 45 on 02/11/2021, started on rosuvastatin 20 mg daily.  Goal LDL less than 70.    Hypertension: On diltiazem 120 mg daily, will switch to Toprol-XL 25 mg daily as above  Snoring/observed apnea events: Sleep study shows mild OSA, no indication for CPAP at this time  RTC in 6 months  Medication Adjustments/Labs and Tests Ordered: Current medicines are reviewed at length with the patient today.  Concerns regarding medicines are outlined above.  No orders of the defined types were placed in this encounter.  No orders of the defined types were placed in this encounter.   Patient Instructions  Medication Instructions:  STOP diltiazem START metoprolol succinate (Toprol XL) 25 mg daily  *If you need a refill on your cardiac medications before your next appointment, please call your pharmacy*  Follow-Up: At [facility], you and your health needs are our priority.  As part of our continuing mission to provide you with exceptional heart care, we have created designated Provider Care Teams.  These Care Teams include your primary Cardiologist (physician) and Advanced Practice Providers (APPs -  Physician Assistants and Nurse Practitioners) who all work together to provide you with the care you need, when you need it.  We recommend signing up for the patient portal called "MyChart".  Sign up information is provided on this After Visit Summary.  MyChart is used to connect with patients for Virtual Visits (Telemedicine).  Patients are able to view lab/test results, encounter notes, upcoming appointments, etc.  Non-urgent messages can be sent to your provider as well.   To learn more about what you can do with MyChart, go to [email or web address].    Your next appointment:   6 month(s)  The format for your next appointment:   In Person  Provider:   Epifanio Lesches, MD      [facility],acting as a scribe for Little Ishikawa, MD.,have documented all relevant documentation on the behalf of Little Ishikawa, MD,as directed by  Little Ishikawa, MD while in the presence of Little Ishikawa, MD.  I, Little Ishikawa, MD, have reviewed all documentation for this visit. The documentation on 02/27/21 for the exam, diagnosis, procedures, and orders are all accurate and complete.   Signed, Little Ishikawa, MD  02/27/2021 2:34 PM    [facility]

## 2021-03-04 NOTE — Progress Notes (Signed)
I agree with the above plan 

## 2021-03-07 ENCOUNTER — Encounter (INDEPENDENT_AMBULATORY_CARE_PROVIDER_SITE_OTHER): Payer: Medicare Other | Admitting: Diagnostic Neuroimaging

## 2021-03-07 ENCOUNTER — Ambulatory Visit (INDEPENDENT_AMBULATORY_CARE_PROVIDER_SITE_OTHER): Payer: Medicare Other | Admitting: Diagnostic Neuroimaging

## 2021-03-07 DIAGNOSIS — Z0289 Encounter for other administrative examinations: Secondary | ICD-10-CM

## 2021-03-07 DIAGNOSIS — Z23 Encounter for immunization: Secondary | ICD-10-CM | POA: Diagnosis not present

## 2021-03-07 DIAGNOSIS — R200 Anesthesia of skin: Secondary | ICD-10-CM | POA: Diagnosis not present

## 2021-03-07 NOTE — Procedures (Signed)
[facility]  NCS (NERVE CONDUCTION STUDY) WITH EMG (ELECTROMYOGRAPHY) REPORT   STUDY DATE: 03/07/21 PATIENT NAME: [name] Short DOB: 09/28/1946 MRN: [id number]  ORDERING CLINICIAN: Ihor Austin, NP   TECHNOLOGIST: Durenda Age ELECTROMYOGRAPHER: Glenford Bayley. Milburn Freeney, MD  CLINICAL INFORMATION: 75 year old male with left hand numbness. History of right carpal tunnel syndrome s/p surgery.   FINDINGS: NERVE CONDUCTION STUDY:  Bilateral median and left ulnar motor responses are normal.  Right median motor response has normal distal latency, decreased amplitude and slow conduction velocity.  Left median sensory response has slight prolonged peak latency and normal amplitude.  Right median sensory response could not be obtained.  Left ulnar F wave latency is normal.   NEEDLE ELECTROMYOGRAPHY:  Needle examination of left upper extremity is normal.    IMPRESSION:   Abnormal study demonstrating: -Very mild left median neuropathy at the wrist consistent with very mild left carpal tunnel syndrome. -Severe right median neuropathy at the wrist consistent with severe right carpal tunnel syndrome (consistent with prior diagnosis and history of surgery).    INTERPRETING PHYSICIAN:  Suanne Marker, MD Certified in Neurology, Neurophysiology and Neuroimaging  [facility] [address], [address] [place], [place] [address] [phone]  [facility]    Nerve / Sites Muscle Latency Ref. Amplitude Ref. Rel Amp Segments Distance Velocity Ref. Area    ms ms mV mV %  cm m/s m/s mVms  L Median - APB     Wrist APB 4.4 ?4.4 3.2 ?4.0 100 Wrist - APB 7   12.1     Upper arm APB 8.4  2.6  81.3 Upper arm - Wrist 20 50 ?49 9.2  R Median - APB     Wrist APB 3.1 ?4.4 0.7 ?4.0 100 Wrist - APB 7   2.2     Upper arm APB 9.0  1.1  151 Upper arm - Wrist 24 41 ?49 4.7     Ulnar Wrist APB 2.9  5.7  506 Ulnar Wrist - APB    19.6  L Ulnar - ADM     Wrist ADM 2.7  ?3.3 8.6 ?6.0 100 Wrist - ADM 7   27.8     B.Elbow ADM 6.1  6.9  80 B.Elbow - Wrist 20 58 ?49 25.2     A.Elbow ADM 7.9  6.7  96.9 A.Elbow - B.Elbow 10 56 ?49 25.4           SNC    Nerve / Sites Rec. Site Peak Lat Ref.  Amp Ref. Segments Distance    ms ms V V  cm  L Median - Orthodromic (Dig II, Mid palm)     Dig II Wrist 3.8 ?3.4 10 ?10 Dig II - Wrist 13  R Median - Orthodromic (Dig II, Mid palm)     Dig II Wrist NR ?3.4 NR ?10 Dig II - Wrist 13  L Ulnar - Orthodromic, (Dig V, Mid palm)     Dig V Wrist 2.9 ?3.1 6 ?5 Dig V - Wrist 42           F  Wave    Nerve F Lat Ref.   ms ms  L Ulnar - ADM 26.8 ?32.0       EMG Summary Table    Spontaneous MUAP Recruitment  Muscle IA Fib PSW Fasc Other Amp Dur. Poly Pattern  L. Deltoid Normal None None None _______ Normal Normal Normal Normal  L. Biceps brachii Normal None None None  _______ Normal Normal Normal Normal  L. Triceps brachii Normal None None None _______ Normal Normal Normal Normal  L. Flexor carpi radialis Normal None None None _______ Normal Normal Normal Normal  L. First dorsal interosseous Normal None None None _______ Normal Normal Normal Normal

## 2021-03-08 ENCOUNTER — Telehealth: Payer: Self-pay | Admitting: Family Medicine

## 2021-03-08 NOTE — Chronic Care Management (AMB) (Signed)
  Chronic Care Management   Note  03/08/2021 Name: ZACARY BAUER MRN: [id number] DOB: 10/16/1945  [name] is a 75 y.o. year old male who is a primary care patient of Beverely Low, Helane Rima, MD. I reached out to [name] by phone today in response to a referral sent by Mr. Albert I Walmsley's PCP, Sheliah Hatch, MD.   [name] was given information about Chronic Care Management services today including:  CCM service includes personalized support from designated clinical staff supervised by his physician, including individualized plan of care and coordination with other care providers 24/7 contact phone numbers for assistance for urgent and routine care needs. Service will only be billed when office clinical staff spend 20 minutes or more in a month to coordinate care. Only one practitioner may furnish and bill the service in a calendar month. The patient may stop CCM services at any time (effective at the end of the month) by phone call to the office staff.   Patient agreed to services and verbal consent obtained.   Follow up plan:   Carmell Austria [employer] Scheduler

## 2021-03-25 ENCOUNTER — Telehealth: Payer: Self-pay | Admitting: *Deleted

## 2021-03-25 NOTE — Telephone Encounter (Signed)
I spoke to the patient and his wife and provided them with the results of the NCV/EMG.

## 2021-03-25 NOTE — Progress Notes (Signed)
Kindly inform the patient that EMG nerve conduction study shows evidence of severe pinched nerve at the right wrist and to a milder extent at the left left wrist as well.

## 2021-03-25 NOTE — Telephone Encounter (Signed)
-----   Message from Micki Riley, MD sent at 03/25/2021  5:30 PM EDT ----- [name] Short inform the patient that EMG nerve conduction study shows evidence of severe pinched nerve at the right wrist and to a milder extent at the left left wrist as well.

## 2021-03-26 ENCOUNTER — Telehealth: Payer: Self-pay

## 2021-03-26 NOTE — Chronic Care Management (AMB) (Signed)
Chronic Care Management Pharmacy Assistant   Name: [name] Short  MRN: [id number] DOB: 1946-07-26  [name] Short is an 75 y.o. year old male who presents for his initial CCM visit with the clinical pharmacist.  Reason for Encounter: Chart Prep/ IQ  Recent office visits:  02/11/21- [name] Rhymes, MD- seen for hyperlipidemia, discontinued red yeast rice extract, follow up 6 months to recheck [facility]  Recent consult visits:  03/07/21- [name] Schmid, MD (Neurology)- Procedure visit for nerve conduction study with electromyography due to numbness of left hand 03/25/21 TE- "EMG nerve conduction study shows evidence of severe pinched nerve at the right wrist and to a milder extent at the left left wrist as well" 02/27/21- [name] Short (Cardiology)- seen for atrial fibrillation, started metoprolol succinate 25 mg daily, discontinued diltiazem to avoid interaction with colchicine, follow up 6 months  02/26/21- [name] Austin, NP (Cardiology )- seen for 3 month stroke follow up, ordered NCV with EMG,  no medication changes, follow up 6 months  01/21/21- [name] Short, OT (Rehabilitation)- Outpatient rehab, discharged this visit due to patient satisfaction with progress, completed 13 out of 17 sessions, instructed to complete daily exercises and follow up with neurology as needed  01/05/21- [name] Guadalajara, MD (Sleep Med.)- Sleep study, nocturnal hypoxemia diagnosis, no indication for CPAP therapy  11/27/20- [name] Lesches, MD (Cardiology)- seen for atrial fibrillation, ZIO patch monitor given, sleep study ordered, no medication changes, follow up 3 months  11/26/20- [name] Austin, NP (Neurology)- seen for hospital stroke follow up, no medication changes, follow up 3 months   Hospital visits:  Medication Reconciliation was completed by comparing discharge summary, patient's EMR and Pharmacy list, and upon discussion with patient.  Admitted to the hospital on 10/21/20 due to  CVA. Discharge date was 10/23/20.  Discharged from [facility].    New?Medications Started at [facility] Discharge:?? acetaminophen 500 mg  apixaban 5 mg  diltiazem 120 mg  Rosuvastatin 20 mg    Medications Discontinued at Hospital Discharge: aspirin 325 MG EC tablet ibuprofen 200 MG tablet  naproxen sodium 220 MG tablet   All other medications remain the same after Hospital Discharge:??   2. Same Day discharge from [facility] on 10/16/20 for Bilateral open inguinal hernia repair with mesh   New?Medications Started at [facility] Discharge:?? Oxycodone 5 mg   All other medications remain the same after Hospital Discharge:??   Medications: Outpatient Encounter Medications as of 03/26/2021  Medication Sig   acetaminophen (TYLENOL) 500 MG tablet Take 1 tablet (500 mg total) by mouth every 6 (six) hours as needed.   apixaban (ELIQUIS) 5 MG TABS tablet Take 1 tablet (5 mg total) by mouth 2 (two) times daily.   calcium carbonate (TUMS - DOSED IN MG ELEMENTAL CALCIUM) 500 MG chewable tablet Chew 1 tablet by mouth as needed for indigestion or heartburn.   Cimetidine (TAGAMET PO) Take 1 tablet by mouth daily as needed (for reflux).   Colchicine 0.6 MG CAPS Take 0.6 mg by mouth daily as needed (for gout flares).   fenofibrate 160 MG tablet TAKE 1 TABLET BY MOUTH  DAILY (Patient taking differently: Take 160 mg by mouth in the morning.)   metoprolol succinate (TOPROL XL) 25 MG 24 hr tablet Take 1 tablet (25 mg total) by mouth daily.   Multiple Vitamin (MULTIVITAMIN WITH MINERALS) TABS Take 1 tablet by mouth daily.   oxyCODONE (OXY IR/ROXICODONE) 5 MG immediate release tablet Take 1 tablet (5 mg total) by mouth  every 6 (six) hours as needed for moderate pain or severe pain.   Polyethylene Glycol 400 (BLINK TEARS) 0.25 % SOLN Place 1 drop into both eyes 2 (two) times daily as needed (for dryness or irritation).   rosuvastatin (CRESTOR) 20 MG tablet Take 1 tablet (20 mg  total) by mouth daily.   tadalafil (CIALIS) 5 MG tablet Take 5 mg by mouth daily as needed for erectile dysfunction.   tamsulosin (FLOMAX) 0.4 MG CAPS capsule Take 0.4 mg by mouth at bedtime as needed (for urinary issues).   No facility-administered encounter medications on file as of 03/26/2021.   I spoke to [name] and all things considered he is doing good. Due to his CVA, he now has slurred speech and drooping on his left side. Due to this, he has had to make some life adjustments. He eats smaller portions and regulates the way he eats in order to prevent any accidents. He also informed me that he isn't able to do the same amount of activity that used to be able to do, but can still handle a lot. He was working with speech therapy, OT and PT, but he has been discharged from all rehab programs. He stated that he was told that they " did all they could do". At the time of discharge he was able to react quickly, run, and speak as they requested him to do. He is hopeful that with time, everything will improve without more interventions.   Current Documented Medications  acetaminophen 500 MG tablet-Patient reported little to no relief with pain  apixaban  5 MG TABS tablet- 90 DS last filled 11/22/20 calcium carbonate 500 MG chewable tablet Reported not taking  Cimetidine  Colchicine 0.6 MG CAPS fenofibrate 160 MG tablet- 90 DS last filled 09/17/20 metoprolol succinate  25 MG 24 hr tablet- 90 DS last filled 02/27/21 Multiple Vitamin TABS oxyCODONE  5 MG immediate release tablet- 7 DS last filled 10/22/20 Polyethylene Glycol 400 0.25 % SOLN rosuvastatin 20 MG tablet- 90 DS last filled 03/19/21 tadalafil  5 MG tablet tamsulosin 0.4 MG CAPS capsule  Reported taking Ibuprofen - Patient reported taking occasionally  Have you seen any other providers since your last visit?  03/07/21- [name] Schmid, MD (Neurology) 02/27/21- [name] Short (Cardiology) 02/26/21- [name] Austin, NP  (Cardiology )  Any changes in your medications or health?  02/27/21- [name] Deer Schumann,MD started metoprolol succinate 25 mg daily, discontinued diltiazem to avoid interaction with colchicine  Any side effects from any medications?  Patient stated he currently has no side effected from medications  Do you have an symptoms or problems not managed by your medications?  Patient stated he has no unmanaged symptoms  Any concerns about your health right now?  Patient stated he has concerns about the next steps after being given results from NCV. He was advised to reach out to PCP for further direction, but he is anxious about the possibility of having another surgery. He is also concerned about the recovery of his right hand after having carpal tunnel surgery last year. He stated that his finger tips still feel tingly and fuzzy  Has your provider asked that you check blood pressure, blood sugar, or follow  special diet at home?  Patient stated he has not been asked to keep any logs   Do you get any type of exercise on a regular basis?  Patient stated he does light yard work and plays doubles tennis once weekly  Can you think of a  goal you would like to reach for your health?  Patient did not state any specific goals, but he is hopeful that he will begin to regain much of the abilities he had before his CVA   Do you have any problems getting your medications?  Patient stated he has no problems with Lucie Leather or with mail delivery for his Eliquis through PAP  Is there anything that you would like to discuss during the appointment?  Patient has chronic issues with back pain that is open for discussion. Patient would also like help connecting with PCP about advise after neurology results. Patient stated he asked PCP if he could discontinue fenofibrate and is still awaiting an answer. He would also like to discuss this   Please bring medications and supplements to appointment Reminded  patient of initial office visit with CPP 06/29 at 11 am   Purvis [employer]ts [employer], [place]

## 2021-03-27 ENCOUNTER — Ambulatory Visit (INDEPENDENT_AMBULATORY_CARE_PROVIDER_SITE_OTHER): Payer: Medicare Other

## 2021-03-27 ENCOUNTER — Other Ambulatory Visit: Payer: Self-pay

## 2021-03-27 ENCOUNTER — Encounter: Payer: Self-pay | Admitting: *Deleted

## 2021-03-27 DIAGNOSIS — E783 Hyperchylomicronemia: Secondary | ICD-10-CM | POA: Diagnosis not present

## 2021-03-27 DIAGNOSIS — I480 Paroxysmal atrial fibrillation: Secondary | ICD-10-CM | POA: Diagnosis not present

## 2021-03-27 DIAGNOSIS — M1000 Idiopathic gout, unspecified site: Secondary | ICD-10-CM

## 2021-03-27 NOTE — Patient Instructions (Signed)
[name] Short,  Thank you for talking with me today. I have included our care plan/goals in the following pages.   Please review and call me at [phone] with any questions.  Thanks! Ellin Mayhew, Pharm.D., BCGP Clinical Pharmacist [facility] [place] [phone] Patient Care Plan: ccm pharmacy care plan     Problem Identified: Afib, CVA hx, Gout, tobacco use, HLD   Priority: High     Long-Range Goal: Patient-Specific Goal   Start Date: 03/27/2021  Expected End Date: 03/27/2022  This Visit's Progress: On track  Priority: High  Note:   Pharmacist Clinical Goal(s):  Patient will verbalize ability to afford treatment regimen contact provider office for questions/concerns as evidenced notation of same in electronic health record through collaboration with PharmD and provider.   Interventions: 1:1 collaboration with Midge Minium, MD regarding development and update of comprehensive plan of care as evidenced by provider attestation and co-signature Inter-disciplinary care team collaboration (see longitudinal plan of care) Comprehensive medication review performed; medication list updated in electronic medical record  Hyperlipidemia: (LDL goal < 70) -Controlled -LDL down to 45 (02/11/21) from 98 (10/22/20) after starting rosuvastatin (09/2020) -Fenofibrate started with TG 229 02/2010, most recent 119 01/2021 -Current treatment: Rosuvastatin 20 mg once daily (started 09/2020)  Fenofibrate 160 mg once daily   -Current dietary patterns: coke, seafood, minimal water. -Current exercise habits: tennis once weekly, yard work  -Educated on [employer];  -Recommended to continue current medication Pt requesting trial off of fenofibrate 160 mg - will review with pcp  Atrial Fibrillation (Goal: prevent stroke and major bleeding) -Controlled -CHADSVASC: 4 -Current treatment: Rate control: Metoprolol succinate 25 mg once  daily Anticoagulation: Eliquis 5 mg twice daily  -Medications previously tried: diltiazem (colchicine interaction). -Home BP and HR readings: doesn't typically check at home  -Counseled on increased risk of stroke due to Afib and benefits of anticoagulation for stroke prevention; -Recommended to continue current medication  Gout (Goal: prevent gouty attacks) -Uncontrolled -Reports three attacks over past year -Hx of calcium based kidney stones. Denies ever being on allopurinol. Would typically eat a lot of seafood, roughly 3 glasses of coke. Does not want to start on allopurinol at this time but has become more interested in future use. Not wanting to make any dietary changes at this time. -Current treatment  Colchicine 0.6 mg as needed -Medications previously tried: none noted  -Counseled on diet and exercise extensively Recommended to continue current medication  Patient Goals/Self-Care Activities Patient will:  - take medications as prescribed target a minimum of 150 minutes of moderate intensity exercise weekly  Follow Up Plan: [facility] f/u telephone visit    The patient was given the following information about Chronic Care Management services today, agreed to services, and gave verbal consent: 1. CCM service includes personalized support from designated clinical staff supervised by the primary care provider, including individualized plan of care and coordination with other care providers 2. 24/7 contact phone numbers for assistance for urgent and routine care needs. 3. Service will only be billed when office clinical staff spend 20 minutes or more in a month to coordinate care. 4. Only one practitioner may furnish and bill the service in a calendar month. 5.The patient may stop CCM services at any time (effective at the end of the month) by phone call to the office staff. 6. The patient will be responsible for cost sharing (co-pay) of up to 20% of the service fee (after  annual deductible is  met). Patient agreed to services and consent obtained.  The patient verbalized understanding of instructions provided today and agreed to receive a MyChart copy of patient instruction and/or educational materials. Telephone follow up appointment with pharmacy team member scheduled for: See next appointment with "Care Management Staff" under "What's Next" below.

## 2021-03-27 NOTE — Progress Notes (Signed)
Chronic Care Management Pharmacy Note  03/27/2021 Name:  [name] Short MRN:  [id number] DOB:  05/13/1946  Subjective: [name] Short is an 75 y.o. year old male who is a primary patient of Tabori, Aundra Millet, MD.  The CCM team was consulted for assistance with disease management and care coordination needs.    Engaged with patient face to face for initial visit in response to provider referral for pharmacy case management and/or care coordination services.   Consent to Services:  The patient was given the following information about Chronic Care Management services today, agreed to services, and gave verbal consent: 1. CCM service includes personalized support from designated clinical staff supervised by the primary care provider, including individualized plan of care and coordination with other care providers 2. 24/7 contact phone numbers for assistance for urgent and routine care needs. 3. Service will only be billed when office clinical staff spend 20 minutes or more in a month to coordinate care. 4. Only one practitioner may furnish and bill the service in a calendar month. 5.The patient may stop CCM services at any time (effective at the end of the month) by phone call to the office staff. 6. The patient will be responsible for cost sharing (co-pay) of up to 20% of the service fee (after annual deductible is met). Patient agreed to services and consent obtained.  Patient Care Team: Midge Minium, MD as PCP - Cephus Richer, MD as Consulting Physician (Urology) Marybelle Killings, MD as Consulting Physician (Orthopedic Surgery) Gatha Mayer, MD as Consulting Physician (Gastroenterology) Harriett Sine, MD as Consulting Physician (Dermatology) Erroll Luna, MD as Consulting Physician (General Surgery) Coralie Keens, MD as Consulting Physician (General Surgery) Madelin Rear, [facility] as Pharmacist (Pharmacist)  Objective:  Lab Results  Component Value  Date   CREATININE 1.32 02/11/2021   CREATININE 1.21 10/23/2020   CREATININE 1.30 (H) 10/21/2020    Lab Results  Component Value Date   HGBA1C 5.6 10/21/2020   Last diabetic Eye exam: No results found for: HMDIABEYEEXA  Last diabetic Foot exam: No results found for: HMDIABFOOTEX      Component Value Date/Time   CHOL 108 02/11/2021 0858   TRIG 119.0 02/11/2021 0858   HDL 38.90 (L) 02/11/2021 0858   CHOLHDL 3 02/11/2021 0858   VLDL 23.8 02/11/2021 0858   LDLCALC 45 02/11/2021 0858   LDLDIRECT 128.1 05/17/2012 0901    Hepatic Function Latest Ref Rng & Units 02/11/2021 10/21/2020 08/13/2020  Total Protein 6.0 - 8.3 g/dL 7.5 7.0 7.0  Albumin 3.5 - 5.2 g/dL 4.8 3.5 4.5  AST 0 - 37 U/L 19 24 24   ALT 0 - 53 U/L 14 18 18   Alk Phosphatase 39 - 117 U/L 63 45 46  Total Bilirubin 0.2 - 1.2 mg/dL 0.4 1.0 0.6  Bilirubin, Direct 0.0 - 0.3 mg/dL 0.1 - 0.1    Lab Results  Component Value Date/Time   TSH 3.39 02/11/2021 08:58 AM   TSH 1.489 10/22/2020 03:15 AM   TSH 2.39 08/13/2020 09:58 AM    CBC Latest Ref Rng & Units 02/11/2021 10/23/2020 10/21/2020  WBC 4.0 - 10.5 K/uL 6.8 12.1(H) -  Hemoglobin 13.0 - 17.0 g/dL 13.9 13.5 13.3  Hematocrit 39.0 - 52.0 % 42.2 40.0 39.0  Platelets 150.0 - 400.0 K/uL 222.0 288 -    No results found for: VD25OH  Clinical ASCVD: Yes  The ASCVD Risk score Mikey Bussing DC Jr., et al., 2013) failed to calculate for the following  reasons:   The patient has a prior MI or stroke diagnosis    Social History   Tobacco Use  Smoking Status Light Smoker   Pack years: 0.00   Types: Cigars  Smokeless Tobacco Never  Tobacco Comments   AVERAGE 3 CIGARS PER WEEK   BP Readings from Last 3 Encounters:  02/27/21 130/70  02/26/21 132/84  02/11/21 118/80   Pulse Readings from Last 3 Encounters:  02/27/21 (!) 52  02/26/21 60  02/11/21 83   Wt Readings from Last 3 Encounters:  02/27/21 171 lb 3.2 oz (77.7 kg)  02/26/21 172 lb (78 kg)  02/11/21 171 lb 12.8 oz (77.9  kg)    Assessment: Review of patient past medical history, allergies, medications, health status, including review of consultants reports, laboratory and other test data, was performed as part of comprehensive evaluation and provision of chronic care management services.   SDOH:  (Social Determinants of Health) assessments and interventions performed: Yes   CCM Care Plan  Allergies  Allergen Reactions   Shellfish-Derived Products Other (See Comments)    Gout    Warfarin Sodium Rash    Medications Reviewed Today     Reviewed by Monia Pouch, CMA (Certified Medical Assistant) on 02/27/21 at 1356  Med List Status: <None>   Medication Order Taking? Sig Documenting Provider Last Dose Status Informant  acetaminophen (TYLENOL) 500 MG tablet [id number] Yes Take 1 tablet (500 mg total) by mouth every 6 (six) hours as needed. Domenic Polite, MD Taking Active   apixaban (ELIQUIS) 5 MG TABS tablet [id number] Yes Take 1 tablet (5 mg total) by mouth 2 (two) times daily. Donato Heinz, MD Taking Active   calcium carbonate (TUMS - DOSED IN MG ELEMENTAL CALCIUM) 500 MG chewable tablet [id number] Yes Chew 1 tablet by mouth as needed for indigestion or heartburn. [provider] Taking Active Multiple Informants  Cimetidine (TAGAMET PO) [id number] Yes Take 1 tablet by mouth daily as needed (for reflux). [provider] Taking Active Multiple Informants  Colchicine 0.6 MG CAPS [id number] Yes Take 0.6 mg by mouth daily as needed (for gout flares). [provider] Taking Active Multiple Informants  diltiazem (CARDIZEM CD) 120 MG 24 hr capsule [id number] Yes Take 1 capsule (120 mg total) by mouth daily. Pt must keep upcoming appt in June for further refills Donato Heinz, MD Taking Active   fenofibrate 160 MG tablet [id number] Yes TAKE 1 TABLET BY MOUTH  DAILY  Patient taking differently: Take 160 mg by mouth in the morning.   Midge Minium, MD Taking  Active   Multiple Vitamin (MULTIVITAMIN WITH MINERALS) TABS [id number] Yes Take 1 tablet by mouth daily. [provider] Taking Active Multiple Informants  oxyCODONE (OXY IR/ROXICODONE) 5 MG immediate release tablet [id number] Yes Take 1 tablet (5 mg total) by mouth every 6 (six) hours as needed for moderate pain or severe pain. Coralie Keens, MD Taking Active Multiple Informants  Polyethylene Glycol 400 (BLINK TEARS) 0.25 % SOLN [id number] Yes Place 1 drop into both eyes 2 (two) times daily as needed (for dryness or irritation). [provider] Taking Active   rosuvastatin (CRESTOR) 20 MG tablet [id number] Yes Take 1 tablet (20 mg total) by mouth daily. Pt must keep upcoming appt in June for further refills Donato Heinz, MD Taking Active   tadalafil (CIALIS) 5 MG tablet [id number] Yes Take 5 mg by mouth daily as needed for erectile dysfunction. [provider] Taking Active Multiple Informants  tamsulosin ([place])  0.4 MG CAPS capsule [id number] Yes Take 0.4 mg by mouth at bedtime as needed (for urinary issues). [provider] Taking Active Multiple Informants           Med Note Duffy Bruce, Sherrie Mustache Oct 21, 2020  8:53 PM)              Patient Active Problem List   Diagnosis Date Noted   History of cerebrovascular accident (CVA) with residual deficit 02/11/2021   CVA (cerebral vascular accident) ([place]) 10/21/2020   Bilateral primary osteoarthritis of knee 02/15/2020   Acquired trigger finger of right little finger 11/03/2019   Encounter for orthopedic follow-up care 10/13/2019   H/O total hip arthroplasty, bilateral 10/24/2018   Gout 08/18/2017   Overweight (BMI 25.0-29.9) 01/23/2017   Foot pain, left 02/11/2016   Sebaceous cyst 07/14/2014   Sun-damaged skin 07/14/2014   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/14/2014   Physical exam, annual 05/17/2012   Inguinal hernia 08/12/2010   HYPERLIPIDEMIA TYPE I / IV 05/01/2010   TOBACCO USER 05/01/2010    Paroxysmal a-fib ([place]) 05/01/2010   ABNORMAL ELECTROCARDIOGRAM 03/26/2010    Immunization History  Administered Date(s) Administered   Fluad Quad(high Dose 65+) 06/25/2019, 08/13/2020   Influenza Split 08/24/2012   Influenza Whole 08/26/2010   Influenza, High Dose Seasonal PF 07/07/2014, 07/23/2017, 07/28/2018   Influenza,inj,Quad PF,6+ Mos 06/08/2013, 07/17/2015, 07/18/2016   Moderna SARS-COV2 Booster Vaccination 08/16/2020   Moderna Sars-Covid-2 Vaccination 10/24/2019, 11/21/2019   Pneumococcal Conjugate-13 07/14/2014   Pneumococcal Polysaccharide-23 06/08/2013   Tdap 09/16/2016   Typhoid Live 06/11/2017   Zoster, Live 05/17/2012   Conditions to be addressed/monitored: Afib, CVA hx, Gout, tobacco use, HLD  Care Plan : ccm pharmacy care plan  Updates made by Madelin Rear, [place] since 03/27/2021 12:00 AM     Problem: Afib, CVA hx, Gout, tobacco use, HLD   Priority: High     Long-Range Goal: Patient-Specific Goal   Start Date: 03/27/2021  Expected End Date: 03/27/2022  This Visit's Progress: On track  Priority: High  Note:   Pharmacist Clinical Goal(s):  Patient will verbalize ability to afford treatment regimen contact provider office for questions/concerns as evidenced notation of same in electronic health record through collaboration with PharmD and provider.   Interventions: 1:1 collaboration with Midge Minium, MD regarding development and update of comprehensive plan of care as evidenced by provider attestation and co-signature Inter-disciplinary care team collaboration (see longitudinal plan of care) Comprehensive medication review performed; medication list updated in electronic medical record  Hyperlipidemia: (LDL goal < 70) -Controlled -LDL down to 45 (02/11/21) from 98 (10/22/20) after starting rosuvastatin (09/2020) -Fenofibrate started with TG 229 02/2010, most recent 119 01/2021 -Current treatment: Rosuvastatin 20 mg once daily (started 09/2020)  Fenofibrate  160 mg once daily   -Current dietary patterns: coke, seafood, minimal water. -Current exercise habits: tennis once weekly, yard work  -Educated on [employer];  -Recommended to continue current medication Pt requesting trial off of fenofibrate 160 mg - will review with pcp  Atrial Fibrillation (Goal: prevent stroke and major bleeding) -Controlled -CHADSVASC: 4 -Current treatment: Rate control: Metoprolol succinate 25 mg once daily Anticoagulation: Eliquis 5 mg twice daily  -Medications previously tried: diltiazem (colchicine interaction). -Home BP and HR readings: doesn't typically check at home  -Counseled on increased risk of stroke due to Afib and benefits of anticoagulation for stroke prevention; -Recommended to continue current medication  Gout (Goal: prevent gouty attacks) -Uncontrolled -Reports three attacks over past year -Hx of  calcium based kidney stones. Denies ever being on allopurinol. Would typically eat a lot of seafood, roughly 3 glasses of coke. Does not want to start on allopurinol at this time but has become more interested in future use. Not wanting to make any dietary changes at this time. -Current treatment  Colchicine 0.6 mg as needed -Medications previously tried: none noted  -Counseled on diet and exercise extensively Recommended to continue current medication  Patient Goals/Self-Care Activities Patient will:  - take medications as prescribed target a minimum of 150 minutes of moderate intensity exercise weekly  Follow Up Plan: [facility] f/u telephone visit    Current Barriers:  Maintenance of hyperlipidemia, eliquis cost concern however enrolled in PAP already   Medication  Assistance:  Elqiuis obtained through [facility] medication assistance program.  Enrollment ends 08/2021 - managed by cardiology  Patient's preferred pharmacy is:  [facility] [id number] [facility], [place] - [address] [place]  [address] Phone: [phone] Fax: [place] [address] [place] [address] Phone: [phone] Fax: [phone]  Follow Up:  Patient agrees to Care Plan and Follow-up.  Future Appointments  Date Time Provider [place]  08/14/2021 10:30 AM Midge Minium, MD [facility]  09/02/2021  1:45 PM Frann Rider, NP [facility] None   Madelin Rear, PharmD, CPP Clinical Pharmacist Practitioner  [facility]  [phone]

## 2021-04-05 DIAGNOSIS — Z20822 Contact with and (suspected) exposure to covid-19: Secondary | ICD-10-CM | POA: Diagnosis not present

## 2021-04-30 ENCOUNTER — Telehealth: Payer: Self-pay

## 2021-04-30 NOTE — Progress Notes (Addendum)
    Chronic Care Management Pharmacy Assistant   Name: [name] Short  MRN: [id number] DOB: [birth year]  Reason for Encounter: Disease State - General Adherence     Recent office visits:  None noted.  Recent consult visits:  None noted.   Hospital visits:  None in previous 6 months  Medications: Outpatient Encounter Medications as of 04/30/2021  Medication Sig   acetaminophen (TYLENOL) 500 MG tablet Take 1 tablet (500 mg total) by mouth every 6 (six) hours as needed.   apixaban (ELIQUIS) 5 MG TABS tablet Take 1 tablet (5 mg total) by mouth 2 (two) times daily.   Cimetidine (TAGAMET PO) Take 1 tablet by mouth daily as needed (for reflux).   Colchicine 0.6 MG CAPS Take 0.6 mg by mouth daily as needed (for gout flares).   fenofibrate 160 MG tablet TAKE 1 TABLET BY MOUTH  DAILY (Patient taking differently: Take 160 mg by mouth in the morning.)   metoprolol succinate (TOPROL XL) 25 MG 24 hr tablet Take 1 tablet (25 mg total) by mouth daily.   Multiple Vitamin (MULTIVITAMIN WITH MINERALS) TABS Take 1 tablet by mouth daily.   Polyethylene Glycol 400 (BLINK TEARS) 0.25 % SOLN Place 1 drop into both eyes 2 (two) times daily as needed (for dryness or irritation).   rosuvastatin (CRESTOR) 20 MG tablet Take 1 tablet (20 mg total) by mouth daily.   tadalafil (CIALIS) 5 MG tablet Take 5 mg by mouth daily as needed for erectile dysfunction.   No facility-administered encounter medications on file as of 04/30/2021.    Have you had any problems recently with your health? Patient denied having any problems with his health currently.  Have you had any problems with your pharmacy? Patient denied any problems with his current pharmacy.  What issues or side effects are you having with your medications? Patient denied any side effects or issues with his current medications.   What would you like me to pass along to Dahlia Byes, CPP for them to help you with?  Patient did not have anything he  would like to pass along to CPP at this time.   What can we do to take care of you better? Patient did not have any suggestions at this time and he is satisfied with his current level of care.  Star Rating Drugs: rosuvastatin (CRESTOR) 20 MG tablet - last filled 03/19/21 90 days    Future Appointments  Date Time Provider Department Center  08/14/2021 10:30 AM Sheliah Hatch, MD [facility]  08/29/2021  8:40 AM Little Ishikawa, MD [facility]  09/02/2021  1:45 PM Ihor Austin, NP [facility] None   Patient scheduled for Follow up CPP phone visit for :  07/17/21 @ 4:00 pm. Patient confirmed appointment date and time.   Eugenie Filler, CCMA Clinical Pharmacist Assistant  [phone]  Time Spent: 40 minutes

## 2021-07-07 DIAGNOSIS — Z23 Encounter for immunization: Secondary | ICD-10-CM | POA: Diagnosis not present

## 2021-07-17 ENCOUNTER — Ambulatory Visit (INDEPENDENT_AMBULATORY_CARE_PROVIDER_SITE_OTHER): Payer: Medicare Other

## 2021-07-17 DIAGNOSIS — I480 Paroxysmal atrial fibrillation: Secondary | ICD-10-CM

## 2021-07-17 DIAGNOSIS — E783 Hyperchylomicronemia: Secondary | ICD-10-CM

## 2021-07-17 NOTE — Progress Notes (Signed)
Chronic Care Management Pharmacy Note  07/17/2021 Name:  [name] Short MRN:  [id number] DOB:  1946-07-26 Summary -pt stopped fenofibrate after running out of refill - does not want to restart until labs 07/2021 f/u -requesting repeat cologuard (last 10/2017), PSA   Subjective: [name] Short is an 75 y.o. year old male who is a primary patient of Tabori, Aundra Millet, MD.  The CCM team was consulted for assistance with disease management and care coordination needs.    Engaged with patient by telephone for follow up visit in response to provider referral for pharmacy case management and/or care coordination services.   Consent to Services:  The patient was given the following information about Chronic Care Management services today, agreed to services, and gave verbal consent: 1. CCM service includes personalized support from designated clinical staff supervised by the primary care provider, including individualized plan of care and coordination with other care providers 2. 24/7 contact phone numbers for assistance for urgent and routine care needs. 3. Service will only be billed when office clinical staff spend 20 minutes or more in a month to coordinate care. 4. Only one practitioner may furnish and bill the service in a calendar month. 5.The patient may stop CCM services at any time (effective at the end of the month) by phone call to the office staff. 6. The patient will be responsible for cost sharing (co-pay) of up to 20% of the service fee (after annual deductible is met). Patient agreed to services and consent obtained.  Patient Care Team: Midge Minium, MD as PCP - Cephus Richer, MD as Consulting Physician (Urology) Marybelle Killings, MD as Consulting Physician (Orthopedic Surgery) Gatha Mayer, MD as Consulting Physician (Gastroenterology) Harriett Sine, MD as Consulting Physician (Dermatology) Erroll Luna, MD as Consulting Physician (General  Surgery) Coralie Keens, MD as Consulting Physician (General Surgery) Madelin Rear, [facility] as Pharmacist (Pharmacist)  Objective:  Lab Results  Component Value Date   CREATININE 1.32 02/11/2021   CREATININE 1.21 10/23/2020   CREATININE 1.30 (H) 10/21/2020    Lab Results  Component Value Date   HGBA1C 5.6 10/21/2020   Last diabetic Eye exam: No results found for: HMDIABEYEEXA  Last diabetic Foot exam: No results found for: HMDIABFOOTEX      Component Value Date/Time   CHOL 108 02/11/2021 0858   TRIG 119.0 02/11/2021 0858   HDL 38.90 (L) 02/11/2021 0858   CHOLHDL 3 02/11/2021 0858   VLDL 23.8 02/11/2021 0858   LDLCALC 45 02/11/2021 0858   LDLDIRECT 128.1 05/17/2012 0901    Hepatic Function Latest Ref Rng & Units 02/11/2021 10/21/2020 08/13/2020  Total Protein 6.0 - 8.3 g/dL 7.5 7.0 7.0  Albumin 3.5 - 5.2 g/dL 4.8 3.5 4.5  AST 0 - 37 U/L _0 ALT 0 - 53 U/L _1 Alk Phosphatase 39 - 117 U/L 63 45 46  Total Bilirubin 0.2 - 1.2 mg/dL 0.4 1.0 0.6  Bilirubin, Direct 0.0 - 0.3 mg/dL 0.1 - 0.1    Lab Results  Component Value Date/Time   TSH 3.39 02/11/2021 08:58 AM   TSH 1.489 10/22/2020 03:15 AM   TSH 2.39 08/13/2020 09:58 AM    CBC Latest Ref Rng & Units 02/11/2021 10/23/2020 10/21/2020  WBC 4.0 - 10.5 K/uL 6.8 12.1(H) -  Hemoglobin 13.0 - 17.0 g/dL 13.9 13.5 13.3  Hematocrit 39.0 - 52.0 % 42.2 40.0 39.0  Platelets 150.0 - 400.0 K/uL 222.0 288 -  No results found for: VD25OH  Clinical ASCVD: Yes  The ASCVD Risk score (Arnett DK, et al., 2019) failed to calculate for the following reasons:   The patient has a prior MI or stroke diagnosis    Social History   Tobacco Use  Smoking Status Light Smoker   Types: Cigars  Smokeless Tobacco Never  Tobacco Comments   AVERAGE 3 CIGARS PER WEEK   BP Readings from Last 3 Encounters:  02/27/21 130/70  02/26/21 132/84  02/11/21 118/80   Pulse Readings from Last 3 Encounters:  02/27/21 (!) 52  02/26/21 60   02/11/21 83   Wt Readings from Last 3 Encounters:  02/27/21 171 lb 3.2 oz (77.7 kg)  02/26/21 172 lb (78 kg)  02/11/21 171 lb 12.8 oz (77.9 kg)    Assessment: Review of patient past medical history, allergies, medications, health status, including review of consultants reports, laboratory and other test data, was performed as part of comprehensive evaluation and provision of chronic care management services.   SDOH:  (Social Determinants of Health) assessments and interventions performed: Yes   CCM Care Plan  Allergies  Allergen Reactions   Shellfish-Derived Products Other (See Comments)    Gout    Warfarin Sodium Rash    Medications Reviewed Today     Reviewed by Madelin Rear, [facility] (Pharmacist) on 07/17/21 at 1631  Med List Status: <None>   Medication Order Taking? Sig Documenting Provider Last Dose Status Informant  acetaminophen (TYLENOL) 500 MG tablet [id number]  Take 1 tablet (500 mg total) by mouth every 6 (six) hours as needed. Domenic Polite, MD  Active   apixaban (ELIQUIS) 5 MG TABS tablet [id number] Yes Take 1 tablet (5 mg total) by mouth 2 (two) times daily. Donato Heinz, MD Taking Active   Cimetidine (TAGAMET PO) [id number]  Take 1 tablet by mouth daily as needed (for reflux). [provider]  Active Multiple Informants  Colchicine 0.6 MG CAPS [id number]  Take 0.6 mg by mouth daily as needed (for gout flares). [provider]  Active Multiple Informants  fenofibrate 160 MG tablet [id number] No TAKE 1 TABLET BY MOUTH  DAILY  Patient not taking: Reported on 07/17/2021   Midge Minium, MD Not Taking Active   metoprolol succinate (TOPROL XL) 25 MG 24 hr tablet [id number] Yes Take 1 tablet (25 mg total) by mouth daily. Donato Heinz, MD Taking Active   Multiple Vitamin (MULTIVITAMIN WITH MINERALS) TABS [id number]  Take 1 tablet by mouth daily. [provider]  Active Multiple Informants  Polyethylene Glycol 400 (BLINK TEARS)  0.25 % SOLN [id number]  Place 1 drop into both eyes 2 (two) times daily as needed (for dryness or irritation). [provider]  Active   rosuvastatin (CRESTOR) 20 MG tablet [id number] Yes Take 1 tablet (20 mg total) by mouth daily. Donato Heinz, MD Taking Active   tadalafil (CIALIS) 5 MG tablet [id number]  Take 5 mg by mouth daily as needed for erectile dysfunction. [provider]  Active Multiple Informants            Patient Active Problem List   Diagnosis Date Noted   History of cerebrovascular accident (CVA) with residual deficit 02/11/2021   CVA (cerebral vascular accident) ([place]) 10/21/2020   Bilateral primary osteoarthritis of knee 02/15/2020   Acquired trigger finger of right little finger 11/03/2019   Encounter for orthopedic follow-up care 10/13/2019   H/O total hip arthroplasty, bilateral 10/24/2018   Gout 08/18/2017   Overweight (BMI  25.0-29.9) 01/23/2017   Foot pain, left 02/11/2016   Sebaceous cyst 07/14/2014   Sun-damaged skin 07/14/2014   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/14/2014   Physical exam, annual 05/17/2012   Inguinal hernia 08/12/2010   HYPERLIPIDEMIA TYPE I / IV 05/01/2010   TOBACCO USER 05/01/2010   Paroxysmal a-fib ([place]) 05/01/2010   ABNORMAL ELECTROCARDIOGRAM 03/26/2010    Immunization History  Administered Date(s) Administered   Fluad Quad(high Dose 65+) 06/25/2019, 08/13/2020   Influenza Split 08/24/2012   Influenza Whole 08/26/2010   Influenza, High Dose Seasonal PF 07/07/2014, 07/23/2017, 07/28/2018   Influenza,inj,Quad PF,6+ Mos 06/08/2013, 07/17/2015, 07/18/2016   Moderna SARS-COV2 Booster Vaccination 08/16/2020   Moderna Sars-Covid-2 Vaccination 10/24/2019, 11/21/2019   Pneumococcal Conjugate-13 07/14/2014   Pneumococcal Polysaccharide-23 06/08/2013   Tdap 09/16/2016   Typhoid Live 06/11/2017   Zoster, Live 05/17/2012   Conditions to be addressed/monitored: Afib, CVA hx, Gout, tobacco use, HLD  Care Plan : ccm  pharmacy care plan  Updates made by Madelin Rear, [facility] since 07/17/2021 12:00 AM     Problem: Afib, CVA hx, Gout, tobacco use, HLD   Priority: High     Long-Range Goal: Patient-Specific Goal   Start Date: 03/27/2021  Expected End Date: 03/27/2022  Recent Progress: On track  Priority: High  Note:   Pharmacist Clinical Goal(s):  Patient will verbalize ability to afford treatment regimen contact provider office for questions/concerns as evidenced notation of same in electronic health record through collaboration with PharmD and provider.   Interventions: 1:1 collaboration with Midge Minium, MD regarding development and update of comprehensive plan of care as evidenced by provider attestation and co-signature Inter-disciplinary care team collaboration (see longitudinal plan of care) Comprehensive medication review performed; medication list updated in electronic medical record  Hyperlipidemia: (LDL goal < 70) -Controlled -LDL down to 45 (02/11/21) from 98 (10/22/20) after starting rosuvastatin (09/2020) -Fenofibrate started with TG 229 02/2010, most recent 119 01/2021. Has been inconsistent with use.  -Current treatment: Rosuvastatin 20 mg once daily (started 09/2020)  Fenofibrate 160 mg once daily (not taking 07/17/2021) -Current dietary patterns: coke, seafood, minimal water, minimal alcohol. Yogurt+fruit for breakfast -Current exercise habits: continues tennis once weekly, yard work  -Educated on [employer];  -Recommended to continue current medication Stopped taking fenofibrate after running out of refill at pharmacy. declines resuming therapy until receiving next labs at PCP f.u.  Atrial Fibrillation (Goal: prevent stroke and major bleeding) -Controlled -CHADSVASC: 4 -Current treatment: Rate control: Metoprolol succinate 25 mg once daily Anticoagulation: Eliquis 5 mg twice daily  -Medications previously tried: diltiazem (colchicine interaction). -Home BP and HR  readings: does not check  -Counseled on increased risk of stroke due to Afib and benefits of anticoagulation for stroke prevention; -Recommended to continue current medication -Counseled on NSAID avoidance due to NSAID use (although occasional while playing tennis)  Gout (Goal: prevent gouty attacks) -Uncontrolled -denies gout since last visit (previously reported 3 attacks within a yr) has not made any major changes other than cutting down on seafood -Hx of calcium based kidney stones.  -Denies ever being on allopurinol. Would typically eat a lot of seafood, roughly 4 glasses of coke per day, couple beers per week in summer. Does not want to start on allopurinol at this time but has become more interested in future use. Not wanting to make any dietary changes at this time. -Current treatment  Colchicine 0.6 mg as needed -Medications previously tried: none noted  -Counseled on diet and exercise extensively Recommended to continue current medication  Care Gaps -Cologuard   Current Barriers:  Maintenance of hyperlipidemia, eliquis cost concern however enrolled in PAP already  Medication  Assistance: Elqiuis obtained through [employer] medication assistance program.  Enrollment ends 08/2021 - managed by cardiology      Patient's preferred pharmacy is:  [facility] [id number], [place] - [address] [place] [address] Phone: [phone] Fax: [place] [address] [place] [address] Phone: [phone] Fax: [phone]  Follow Up:  Patient agrees to Care Plan and Follow-up.  Future Appointments  Date Time Provider [place]  08/14/2021 10:30 AM Midge Minium, MD [facility]  08/29/2021  8:40 AM Donato Heinz, MD [facility]  09/02/2021  1:45 PM Frann Rider, NP [facility] None   Madelin Rear, PharmD, CPP Clinical Pharmacist Practitioner   [facility]  [phone]

## 2021-07-17 NOTE — Patient Instructions (Signed)
Mr. Chrostowski,  Thank you for talking with me today. I have included our care plan/goals in the following pages.   Please review and call me at [phone] with any questions.  Thanks! Johnell Comings, PharmD, CPP Clinical Pharmacist Practitioner  [phone]  Care Plan : ccm pharmacy care plan  Updates made by Dahlia Byes, [facility] since 07/17/2021 12:00 AM     Problem: Afib, CVA hx, Gout, tobacco use, HLD   Priority: High     Long-Range Goal: Patient-Specific Goal   Start Date: 03/27/2021  Expected End Date: 03/27/2022  Recent Progress: On track  Priority: High  Note:   Pharmacist Clinical Goal(s):  Patient will verbalize ability to afford treatment regimen contact provider office for questions/concerns as evidenced notation of same in electronic health record through collaboration with PharmD and provider.   Interventions: 1:1 collaboration with Sheliah Hatch, MD regarding development and update of comprehensive plan of care as evidenced by provider attestation and co-signature Inter-disciplinary care team collaboration (see longitudinal plan of care) Comprehensive medication review performed; medication list updated in electronic medical record  Hyperlipidemia: (LDL goal < 70) -Controlled -LDL down to 45 (02/11/21) from 98 (10/22/20) after starting rosuvastatin (09/2020) -Fenofibrate started with TG 229 02/2010, most recent 119 01/2021. Has been inconsistent with use.  -Current treatment: Rosuvastatin 20 mg once daily (started 09/2020)  Fenofibrate 160 mg once daily   -Current dietary patterns: coke, seafood, minimal water, minimal alcohol. Yogurt+fruit for breakfast -Current exercise habits: continues tennis once weekly, yard work  -Educated on [employer];  -Recommended to continue current medication Stopped taking fenofibrate after running out of refill at pharmacy. declines resuming therapy until receiving next labs at PCP f.u.  Atrial Fibrillation  (Goal: prevent stroke and major bleeding) -Controlled -CHADSVASC: 4 -Current treatment: Rate control: Metoprolol succinate 25 mg once daily Anticoagulation: Eliquis 5 mg twice daily  -Medications previously tried: diltiazem (colchicine interaction). -Home BP and HR readings: does not check  -Counseled on increased risk of stroke due to Afib and benefits of anticoagulation for stroke prevention; -Recommended to continue current medication -Counseled on NSAID avoidance due to NSAID use (although occasional while playing tennis)  Gout (Goal: prevent gouty attacks) -Uncontrolled -denies gout since last visit (previously reported 3 attacks within a yr) has not made any major changes other than cutting down on seafood -Hx of calcium based kidney stones.  -Denies ever being on allopurinol. Would typically eat a lot of seafood, roughly 4 glasses of coke per day, couple beers per week in summer. Does not want to start on allopurinol at this time but has become more interested in future use. Not wanting to make any dietary changes at this time. -Current treatment  Colchicine 0.6 mg as needed -Medications previously tried: none noted  -Counseled on diet and exercise extensively Recommended to continue current medication  Care Gaps -Cologuard   Current Barriers:  Maintenance of hyperlipidemia, eliquis cost concern however enrolled in PAP already  Medication  Assistance: Elqiuis obtained through [employer] medication assistance program.  Enrollment ends 08/2021 - managed by cardiology     The patient verbalized understanding of instructions provided today and agreed to receive a MyChart copy of patient instruction and/or educational materials. Telephone follow up appointment with pharmacy team member scheduled for: See next appointment with "Care Management Staff" under "What's Next" below.

## 2021-07-22 DIAGNOSIS — Z23 Encounter for immunization: Secondary | ICD-10-CM | POA: Diagnosis not present

## 2021-07-29 DIAGNOSIS — I480 Paroxysmal atrial fibrillation: Secondary | ICD-10-CM

## 2021-07-29 DIAGNOSIS — E783 Hyperchylomicronemia: Secondary | ICD-10-CM

## 2021-08-09 DIAGNOSIS — Z20828 Contact with and (suspected) exposure to other viral communicable diseases: Secondary | ICD-10-CM | POA: Diagnosis not present

## 2021-08-14 ENCOUNTER — Encounter: Payer: Self-pay | Admitting: Family Medicine

## 2021-08-14 ENCOUNTER — Ambulatory Visit (INDEPENDENT_AMBULATORY_CARE_PROVIDER_SITE_OTHER): Payer: Medicare Other | Admitting: Family Medicine

## 2021-08-14 VITALS — BP 110/82 | HR 93 | Temp 98.4°F | Resp 16 | Wt 173.8 lb

## 2021-08-14 DIAGNOSIS — N200 Calculus of kidney: Secondary | ICD-10-CM | POA: Diagnosis not present

## 2021-08-14 DIAGNOSIS — E783 Hyperchylomicronemia: Secondary | ICD-10-CM | POA: Diagnosis not present

## 2021-08-14 DIAGNOSIS — I639 Cerebral infarction, unspecified: Secondary | ICD-10-CM | POA: Diagnosis not present

## 2021-08-14 DIAGNOSIS — Z125 Encounter for screening for malignant neoplasm of prostate: Secondary | ICD-10-CM | POA: Diagnosis not present

## 2021-08-14 DIAGNOSIS — Z1211 Encounter for screening for malignant neoplasm of colon: Secondary | ICD-10-CM

## 2021-08-14 DIAGNOSIS — I480 Paroxysmal atrial fibrillation: Secondary | ICD-10-CM | POA: Diagnosis not present

## 2021-08-14 LAB — CBC WITH DIFFERENTIAL/PLATELET
Basophils Absolute: 0 10*3/uL (ref 0.0–0.1)
Basophils Relative: 0.6 % (ref 0.0–3.0)
Eosinophils Absolute: 0.1 10*3/uL (ref 0.0–0.7)
Eosinophils Relative: 1.2 % (ref 0.0–5.0)
HCT: 47.4 % (ref 39.0–52.0)
Hemoglobin: 15.2 g/dL (ref 13.0–17.0)
Lymphocytes Relative: 22.8 % (ref 12.0–46.0)
Lymphs Abs: 1.8 10*3/uL (ref 0.7–4.0)
MCHC: 32.1 g/dL (ref 30.0–36.0)
MCV: 91.4 fl (ref 78.0–100.0)
Monocytes Absolute: 0.6 10*3/uL (ref 0.1–1.0)
Monocytes Relative: 7.8 % (ref 3.0–12.0)
Neutro Abs: 5.4 10*3/uL (ref 1.4–7.7)
Neutrophils Relative %: 67.6 % (ref 43.0–77.0)
Platelets: 179 10*3/uL (ref 150.0–400.0)
RBC: 5.18 Mil/uL (ref 4.22–5.81)
RDW: 13.2 % (ref 11.5–15.5)
WBC: 8 10*3/uL (ref 4.0–10.5)

## 2021-08-14 LAB — BASIC METABOLIC PANEL
BUN: 17 mg/dL (ref 6–23)
CO2: 24 mEq/L (ref 19–32)
Calcium: 9.6 mg/dL (ref 8.4–10.5)
Chloride: 105 mEq/L (ref 96–112)
Creatinine, Ser: 1.13 mg/dL (ref 0.40–1.50)
GFR: 64.31 mL/min (ref >60.00)
Glucose, Bld: 90 mg/dL (ref 70–99)
Potassium: 4 mEq/L (ref 3.5–5.1)
Sodium: 142 mEq/L (ref 135–145)

## 2021-08-14 LAB — HEPATIC FUNCTION PANEL
ALT: 21 U/L (ref 0–53)
AST: 23 U/L (ref 0–37)
Albumin: 4.9 g/dL (ref 3.5–5.2)
Alkaline Phosphatase: 77 U/L (ref 39–117)
Bilirubin, Direct: 0.1 mg/dL (ref 0.0–0.3)
Total Bilirubin: 0.8 mg/dL (ref 0.2–1.2)
Total Protein: 7.7 g/dL (ref 6.0–8.3)

## 2021-08-14 LAB — LIPID PANEL
Cholesterol: 127 mg/dL (ref 0–200)
HDL: 44 mg/dL (ref >39.00)
LDL Cholesterol: 49 mg/dL (ref 0–99)
NonHDL: 82.65
Total CHOL/HDL Ratio: 3
Triglycerides: 167 mg/dL — ABNORMAL HIGH (ref 0.0–149.0)
VLDL: 33.4 mg/dL (ref 0.0–40.0)

## 2021-08-14 LAB — TSH: TSH: 3.12 u[IU]/mL (ref 0.35–5.50)

## 2021-08-14 LAB — PSA, MEDICARE: PSA: 2.25 ng/ml (ref 0.10–4.00)

## 2021-08-14 NOTE — Progress Notes (Signed)
   Subjective:    Patient ID: [name] Short, male    DOB: December 20, 1945, 75 y.o.   MRN: [id number]  HPI Hyperlipidemia chronic problem, on Fenofibrate 160mg  daily and Crestor 20mg  daily.  Denies abd pain, N/V.  Continues to play tennis and do yard work  Paroxysmal Afib- on Metoprolol XL 25mg  daily for rate control and Eliquis 5mg  BID for anticoagulation.  + fatigue since having his CVA.  No CP, SOB.  Recurrent kidney stones- 8/26, 10/1 had 'excrutiating pain'.  Took left over pain meds and has appt upcoming w/ Urology.  Does have hx of gout.  Will take Colchicine as needed.  Prostate cancer screen- due for PSA  Colon cancer screen- due for repeat cologuard  UTD on flu shot, PNA vaccines.   Review of Systems For ROS see HPI   This visit occurred during the SARS-CoV-2 public health emergency.  Safety protocols were in place, including screening questions prior to the visit, additional usage of staff PPE, and extensive cleaning of exam room while observing appropriate contact time as indicated for disinfecting solutions.      Objective:   Physical Exam Vitals reviewed.  Constitutional:      General: He is not in acute distress.    Appearance: Normal appearance. He is well-developed. He is not ill-appearing.  HENT:     Head: Normocephalic and atraumatic.  Eyes:     Extraocular Movements: Extraocular movements intact.     Conjunctiva/sclera: Conjunctivae normal.     Pupils: Pupils are equal, round, and reactive to light.  Neck:     Thyroid: No thyromegaly.  Cardiovascular:     Rate and Rhythm: Normal rate. Rhythm irregular.     Pulses: Normal pulses.     Heart sounds: Normal heart sounds. No murmur heard. Pulmonary:     Effort: Pulmonary effort is normal. No respiratory distress.     Breath sounds: Normal breath sounds.  Abdominal:     General: Bowel sounds are normal. There is no distension.     Palpations: Abdomen is soft.  Musculoskeletal:     Cervical back: Normal  range of motion and neck supple.     Right lower leg: No edema.     Left lower leg: No edema.  Lymphadenopathy:     Cervical: No cervical adenopathy.  Skin:    General: Skin is warm and dry.  Neurological:     General: No focal deficit present.     Mental Status: He is alert and oriented to person, place, and time.     Cranial Nerves: No cranial nerve deficit.  Psychiatric:        Mood and Affect: Mood normal.        Behavior: Behavior normal.          Assessment & Plan:

## 2021-08-14 NOTE — Assessment & Plan Note (Signed)
Chronic problem.  Tolerating Fenofibrate and Crestor w/o difficulty.  Check labs.  Adjust meds prn

## 2021-08-14 NOTE — Assessment & Plan Note (Signed)
Pt is in Afib today but is rate controlled.  He is asymptomatic.  On Eliquis and Metoprolol.  Will follow along w/ cardiology.

## 2021-08-14 NOTE — Assessment & Plan Note (Signed)
New to provider, ongoing for pt.  Had 2 recent episodes- 8/26 and 10/1- and has appt upcoming w/ Urology.  He does have a hx of gout but reports flares are infrequent.  Asked him to have uro send me their notes so I can follow along.

## 2021-08-14 NOTE — Patient Instructions (Signed)
Follow up in 6 months to recheck cholesterol We'll notify you of your lab results and make any changes if needed Continue to work on healthy diet and regular physical activity Have the urologist send me a copy of their reports/notes Complete the Cologuard and return as directed Call with any questions or concerns Stay Safe!  Stay Healthy! Happy Holidays!!!

## 2021-08-20 DIAGNOSIS — I700 Atherosclerosis of aorta: Secondary | ICD-10-CM | POA: Diagnosis not present

## 2021-08-20 DIAGNOSIS — N200 Calculus of kidney: Secondary | ICD-10-CM | POA: Diagnosis not present

## 2021-08-20 DIAGNOSIS — R3121 Asymptomatic microscopic hematuria: Secondary | ICD-10-CM | POA: Diagnosis not present

## 2021-08-20 DIAGNOSIS — R3914 Feeling of incomplete bladder emptying: Secondary | ICD-10-CM | POA: Diagnosis not present

## 2021-08-20 DIAGNOSIS — K429 Umbilical hernia without obstruction or gangrene: Secondary | ICD-10-CM | POA: Diagnosis not present

## 2021-08-20 DIAGNOSIS — N400 Enlarged prostate without lower urinary tract symptoms: Secondary | ICD-10-CM | POA: Diagnosis not present

## 2021-08-20 DIAGNOSIS — M5136 Other intervertebral disc degeneration, lumbar region: Secondary | ICD-10-CM | POA: Diagnosis not present

## 2021-08-28 NOTE — Progress Notes (Signed)
Cardiology Office Note:    Date:  08/29/2021   ID:  [name] Short, DOB [birth year], MRN [id number]  PCP:  Sheliah Hatchabori, Katherine E, MD  Cardiologist:  None  Electrophysiologist:  None   Referring MD: Sheliah Hatchabori, Katherine E, MD   No chief complaint on file.   History of Present Illness:    [name] Short is a 75 y.o. male with a hx of atrial fibrillation, CVA, hypertension, hyperlipidemia who presents for follow-up.  He was initially seen on 11/27/2020.  He has a history of paroxysmal atrial fibrillation but has not been on anticoagulation.  He presented to the ED with slurred speech and left hand weakness on 10/21/20, was found to have acute CVA.  MRI brain showed acute infarct in the posterior right frontal lobe.  He had residual left-sided weakness and dysarthria.  Echocardiogram showed normal LVEF.  He was started on Eliquis 5 mg twice daily, stroke likely secondary to atrial fibrillation.  He had paroxysmal atrial fibrillation while in the hospital, heart rate 90s to 100s while in A. fib.  He was started on Cardizem.  Zio patch x14 days showed 18% A. fib burden with longest episode lasting 1 day 7 hours, average heart rate in A. fib 80 bpm.  Also showed 5 episodes of SVT, longest lasting 18 seconds.  Since last clinic visit, he reports that he is doing well.  Denies any chest pain, dyspnea, lightheadedness, syncope, lower extremity edema, or palpitations.  He is taking Eliquis, denies any bleeding issues.  He plays tennis about once per week and does yard work.  Feels fatigued sometimes with this but denies any chest pain or dyspnea.    Past Medical History:  Diagnosis Date   Arthritis    WRIST   Benign localized prostatic hyperplasia with lower urinary tract symptoms (LUTS)    Complication of anesthesia    slow to wake   ED (erectile dysfunction)    GERD (gastroesophageal reflux disease)    History of concussion    AS CHILD--  NO RESIDUAL   History of gout    History of kidney  stones    Hyperlipidemia    Inguinal hernia, bilateral    Nephrolithiasis    BILATERAL   PAF (paroxysmal atrial fibrillation) (HCC) currently being followed by pcp   EPISODE --  2011  FOLLOW-UP W/ DR WALL  /  HAS NOT SEEN ANY CARDIOLOGIST SINCE    Past Surgical History:  Procedure Laterality Date   APPENDECTOMY  1983   CARDIOVASCULAR STRESS TEST  05-08-2010  DR WALL   NO EVIDENCE OF SCAR OR ISCHEMIA/ EF 54%/  PT HAD BOTH AFIB/ AFLUTTER DURING STUDY   CARPAL TUNNEL RELEASE Right 01/ 14/ 2021   CYSTOSCOPY W/ URETERAL STENT PLACEMENT Left 09/20/2013   Procedure: CYSTOSCOPY WITH STENT REPLACEMENT;  Surgeon: Antony HasteMatthew Ramsey Eskridge, MD;  [facility] [place];  Service: Urology;  Laterality: Left;   CYSTOSCOPY WITH URETEROSCOPY Left 09/20/2013   Procedure: CYSTOSCOPY WITH URETEROSCOPY;  Surgeon: Antony HasteMatthew Ramsey Eskridge, MD;  [facility] [place];  Service: Urology;  Laterality: Left;   CYSTOSCOPY WITH URETEROSCOPY AND STENT PLACEMENT Left 07/12/2013   Procedure: CYSTOSCOPY WITH LITHOLAPEXY, LEFT URETERAL STENT PLACEMENT;  Surgeon: Antony HasteMatthew Ramsey Eskridge, MD;  [facility] [place];  Service: Urology;  Laterality: Left;   CYSTOSCOPY WITH URETEROSCOPY AND STENT PLACEMENT Bilateral 06/23/2014   Procedure: BILATERAL URETEROSCOPY, HOLMIUM LASER LITHOTRIPSY AND STENT PLACEMENT, RIGHT ;  Surgeon: Jerilee FieldMatthew Eskridge, MD;  [facility]  [place];  Service: Urology;  Laterality: Bilateral;   EXTRACORPOREAL SHOCK WAVE LITHOTRIPSY  X2   EXTRACORPOREAL SHOCK WAVE LITHOTRIPSY Left 08-29-2013;   07-28-2013   EXTRACORPOREAL SHOCK WAVE LITHOTRIPSY Right 12/09/2018   Procedure: EXTRACORPOREAL SHOCK WAVE LITHOTRIPSY (ESWL);  Surgeon: Jerilee Field, MD;  Location: [facility];  Service: Urology;  Laterality: Right;   HOLMIUM LASER APPLICATION Left 09/20/2013   Procedure: HOLMIUM LASER APPLICATION;  Surgeon: Antony Haste, MD;  Location: [facility];  Service: Urology;  Laterality: Left;   INGUINAL HERNIA REPAIR Bilateral 10/16/2020   Procedure: BILATERAL OPEN INGUINAL HERNIA REPAIR WITH MESH;  Surgeon: Abigail Miyamoto, MD;  Location: [facility] [place];  Service: General;  Laterality: Bilateral;  LMA/TAP BLOCK   LAPAROSCOPIC INGUINAL HERNIA REPAIR Bilateral 09-04-2010   w/ mesh   TONSILLECTOMY  AS CHILD   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   TOTAL HIP ARTHROPLASTY Right 11/15/2012   Procedure: RIGHT TOTAL HIP ARTHROPLASTY ANTERIOR APPROACH;  Surgeon: Eldred Manges, MD;  Location: [facility];  Service: Orthopedics;  Laterality: Right;  Right total hip arthroplasty   TRANSTHORACIC ECHOCARDIOGRAM  05-08-2010   NORMAL LVSF/  EF 55%/  GRADE I DIASTOLIC DYSFUNCTION/  MILD BILATERAL ATRIUM ENLARGEMENT    Current Medications: Current Meds  Medication Sig   acetaminophen (TYLENOL) 500 MG tablet Take 1 tablet (500 mg total) by mouth every 6 (six) hours as needed.   apixaban (ELIQUIS) 5 MG TABS tablet Take 1 tablet (5 mg total) by mouth 2 (two) times daily.   Cimetidine (TAGAMET PO) Take 1 tablet by mouth daily as needed (for reflux).   Colchicine 0.6 MG CAPS Take 0.6 mg by mouth daily as needed (for gout flares).   metoprolol succinate (TOPROL XL) 25 MG 24 hr tablet Take 1 tablet (25 mg total) by mouth daily.   Multiple Vitamin (MULTIVITAMIN WITH MINERALS) TABS Take 1 tablet by mouth daily.   Polyethylene Glycol 400 (BLINK TEARS) 0.25 % SOLN Place 1 drop into both eyes 2 (two) times daily as needed (for dryness or irritation).   rosuvastatin (CRESTOR) 20 MG tablet Take 1 tablet (20 mg total) by mouth daily.   silodosin (RAPAFLO) 8 MG CAPS capsule Take 8 mg by mouth daily.   tadalafil (CIALIS) 5 MG tablet Take 5 mg by mouth daily as needed for erectile dysfunction.     Allergies:   Shellfish-derived products and Warfarin sodium   Social History   Socioeconomic History   Marital status: Married    Spouse name: Not on  file   Number of children: Not on file   Years of education: Not on file   Highest education level: Not on file  Occupational History   Occupation: retired  Tobacco Use   Smoking status: Light Smoker    Types: Cigars   Smokeless tobacco: Never   Tobacco comments:    AVERAGE 3 CIGARS PER WEEK  Vaping Use   Vaping Use: Never used  Substance and Sexual Activity   Alcohol use: Not Currently    Comment: occasional   Drug use: No   Sexual activity: Not on file  Other Topics Concern   Not on file  Social History Narrative   Not on file   Social Determinants of Health   Financial Resource Strain: Not on file  Food Insecurity: Not on file  Transportation Needs: Not on file  Physical Activity: Not on file  Stress: Not on file  Social Connections: Not on file  Family History: The patient's family history includes Arthritis in his mother; Bipolar disorder in his brother; Dementia in his brother and mother; Diabetes in his brother, maternal grandfather, maternal grandmother, mother, and sister; Heart disease in his father; Neuropathy in his brother; Prostate cancer in his father; Rheum arthritis in his mother.  ROS:   Please see the history of present illness.     All other systems reviewed and are negative.  EKGs/Labs/Other Studies Reviewed:    The following studies were reviewed today:  14-Day Monitor 01/03/2021: 18% A. fib burden, longest episode lasting 1 day 7 hours. Average heart rate in A. fib 80 bpm 5 episodes of SVT, longest lasting 18 seconds with average rate 94 bpm   Patch Wear Time:  14 days and 0 hours (2022-03-17T12:53:47-0400 to 2022-03-31T12:53:51-0400)   Patient had a min HR of 41 bpm, max HR of 144 bpm, and avg HR of 61 bpm. Predominant underlying rhythm was Sinus Rhythm. 5 Supraventricular Tachycardia runs occurred, the run with the fastest interval lasting 14 beats with a max rate of 133 bpm, the  longest lasting 17.9 secs with an avg rate of 94 bpm.  Atrial Fibrillation occurred (18% burden), ranging from 49-144 bpm (avg of 80 bpm), the longest lasting 1 day 7 hours with an avg rate of 83 bpm. Isolated SVEs were rare (<1.0%), SVE Couplets were  rare (<1.0%), and SVE Triplets were rare (<1.0%). Isolated VEs were rare (<1.0%), VE Couplets were rare (<1.0%), and no VE Triplets were present.  No patient triggered events.  Echo 10/23/2020: 1. Pt in atrial fibrillation at time of study.   2. Left ventricular ejection fraction, by estimation, is 60 to 65%. The  left ventricle has normal function. The left ventricle has no regional  wall motion abnormalities. There is mild left ventricular hypertrophy.  Left ventricular diastolic function  could not be evaluated.   3. Right ventricular systolic function is normal. The right ventricular  size is normal. Tricuspid regurgitation signal is inadequate for assessing  PA pressure.   4. The mitral valve is normal in structure. Trivial mitral valve  regurgitation. No evidence of mitral stenosis.   5. The aortic valve is tricuspid. Aortic valve regurgitation is trivial.  No aortic stenosis is present.   6. The inferior vena cava is normal in size with greater than 50%  respiratory variability, suggesting right atrial pressure of 3 mmHg.   EKG:    08/2021: sinus rhythm, rate 54,no ST abnormalities 02/27/2021: normal sinus rhythm, rate 52, Nonspecific T wave flattening 11/27/2020: normal sinus rhythm, rate 65, no ST abnormalities  Recent Labs: 08/14/2021: ALT 21; BUN 17; Creatinine, Ser 1.13; Hemoglobin 15.2; Platelets 179.0; Potassium 4.0; Sodium 142; TSH 3.12  Recent Lipid Panel    Component Value Date/Time   CHOL 127 08/14/2021 1100   TRIG 167.0 (H) 08/14/2021 1100   HDL 44.00 08/14/2021 1100   CHOLHDL 3 08/14/2021 1100   VLDL 33.4 08/14/2021 1100   LDLCALC 49 08/14/2021 1100   LDLDIRECT 128.1 05/17/2012 0901    Physical Exam:    VS:  BP 118/74   Pulse (!) 54   Ht 5\' 7"  (1.702 m)   Wt 176  lb (79.8 kg)   SpO2 97%   BMI 27.57 kg/m     Wt Readings from Last 3 Encounters:  08/29/21 176 lb (79.8 kg)  08/14/21 173 lb 12.8 oz (78.8 kg)  02/27/21 171 lb 3.2 oz (77.7 kg)     GEN:  in no acute  distress HEENT: Normal NECK: No JVD; No carotid bruits CARDIAC: RRR, no murmurs, rubs, gallops RESPIRATORY:  Clear to auscultation without rales, wheezing or rhonchi  ABDOMEN: Soft, non-tender, non-distended MUSCULOSKELETAL:  No edema; No deformity  SKIN: Warm and dry NEUROLOGIC:  Alert and oriented x 3 PSYCHIATRIC:  Normal affect   ASSESSMENT:    1. Paroxysmal atrial fibrillation (HCC)   2. Cerebrovascular accident (CVA), unspecified mechanism ([place])   3. Essential hypertension   4. Hyperlipidemia, unspecified hyperlipidemia type     PLAN:    Paroxysmal atrial fibrillation: CHA2DS2-VASc score 4 (age, hypertension, CVA x2).  Echocardiogram 10/23/2020 showed normal biventricular function, no significant valvular disease.  Zio patch x14 days showed 18% A. fib burden with longest episode lasting 1 day 7 hours, average heart rate in A. fib 80 bpm.  Also showed 5 episodes of SVT, longest lasting 18 seconds. -Continue Eliquis 5 mg bid -Continue Toprol-XL 25 mg daily -Sleep study shows mild OSA, no indication for CPAP at this time  CVA: Presented 10/21/2020 with left-sided weakness, found to have acute CVA.  Likely due to A. fib as above.  Continue Eliquis, statin  Hyperlipidemia: on rosuvastatin 20 mg daily, LDL 49 on 08/14/21.  Hypertension: Continue Toprol-XL 25 mg daily  Snoring/observed apnea events: Sleep study shows mild OSA, no indication for CPAP at this time  RTC in 6 months  Medication Adjustments/Labs and Tests Ordered: Current medicines are reviewed at length with the patient today.  Concerns regarding medicines are outlined above.  Orders Placed This Encounter  Procedures   EKG 12-Lead    No orders of the defined types were placed in this encounter.   Patient  Instructions  Medication Instructions:  Your physician recommends that you continue on your current medications as directed. Please refer to the Current Medication list given to you today.  *If you need a refill on your cardiac medications before your next appointment, please call your pharmacy*  Follow-Up: At [facility], you and your health needs are our priority.  As part of our continuing mission to provide you with exceptional heart care, we have created designated Provider Care Teams.  These Care Teams include your primary Cardiologist (physician) and Advanced Practice Providers (APPs -  Physician Assistants and Nurse Practitioners) who all work together to provide you with the care you need, when you need it.  We recommend signing up for the patient portal called "MyChart".  Sign up information is provided on this After Visit Summary.  MyChart is used to connect with patients for Virtual Visits (Telemedicine).  Patients are able to view lab/test results, encounter notes, upcoming appointments, etc.  Non-urgent messages can be sent to your provider as well.   To learn more about what you can do with MyChart, go to [email or web address].    Your next appointment:   6 month(s)  The format for your next appointment:   In Person  Provider:   Dr. Gardiner Rhyme     Signed, Donato Heinz, MD  08/29/2021 8:58 AM    [place]

## 2021-08-29 ENCOUNTER — Other Ambulatory Visit: Payer: Self-pay

## 2021-08-29 ENCOUNTER — Ambulatory Visit (INDEPENDENT_AMBULATORY_CARE_PROVIDER_SITE_OTHER): Payer: Medicare Other | Admitting: Cardiology

## 2021-08-29 ENCOUNTER — Encounter: Payer: Self-pay | Admitting: Cardiology

## 2021-08-29 VITALS — BP 118/74 | HR 54 | Ht 67.0 in | Wt 176.0 lb

## 2021-08-29 DIAGNOSIS — E785 Hyperlipidemia, unspecified: Secondary | ICD-10-CM

## 2021-08-29 DIAGNOSIS — I639 Cerebral infarction, unspecified: Secondary | ICD-10-CM

## 2021-08-29 DIAGNOSIS — I10 Essential (primary) hypertension: Secondary | ICD-10-CM

## 2021-08-29 DIAGNOSIS — I480 Paroxysmal atrial fibrillation: Secondary | ICD-10-CM | POA: Diagnosis not present

## 2021-08-29 NOTE — Patient Instructions (Signed)
Medication Instructions:  Your physician recommends that you continue on your current medications as directed. Please refer to the Current Medication list given to you today.  *If you need a refill on your cardiac medications before your next appointment, please call your pharmacy*  Follow-Up: At [facility], you and your health needs are our priority.  As part of our continuing mission to provide you with exceptional heart care, we have created designated Provider Care Teams.  These Care Teams include your primary Cardiologist (physician) and Advanced Practice Providers (APPs -  Physician Assistants and Nurse Practitioners) who all work together to provide you with the care you need, when you need it.  We recommend signing up for the patient portal called "MyChart".  Sign up information is provided on this After Visit Summary.  MyChart is used to connect with patients for Virtual Visits (Telemedicine).  Patients are able to view lab/test results, encounter notes, upcoming appointments, etc.  Non-urgent messages can be sent to your provider as well.   To learn more about what you can do with MyChart, go to [email or web address].    Your next appointment:   6 month(s)  The format for your next appointment:   In Person  Provider:   Dr. Bjorn Pippin

## 2021-09-02 ENCOUNTER — Ambulatory Visit (INDEPENDENT_AMBULATORY_CARE_PROVIDER_SITE_OTHER): Payer: Medicare Other | Admitting: Adult Health

## 2021-09-02 ENCOUNTER — Encounter: Payer: Self-pay | Admitting: Adult Health

## 2021-09-02 VITALS — BP 131/82 | HR 52 | Ht 67.0 in | Wt 178.0 lb

## 2021-09-02 DIAGNOSIS — I10 Essential (primary) hypertension: Secondary | ICD-10-CM

## 2021-09-02 DIAGNOSIS — I639 Cerebral infarction, unspecified: Secondary | ICD-10-CM | POA: Diagnosis not present

## 2021-09-02 DIAGNOSIS — E785 Hyperlipidemia, unspecified: Secondary | ICD-10-CM | POA: Diagnosis not present

## 2021-09-02 DIAGNOSIS — I480 Paroxysmal atrial fibrillation: Secondary | ICD-10-CM

## 2021-09-02 NOTE — Patient Instructions (Addendum)
Continue Eliquis (apixaban) daily  and Crestor for secondary stroke prevention  Continue to follow up with PCP regarding cholesterol and blood pressure management  Maintain strict control of hypertension with blood pressure goal below 130/90 and cholesterol with LDL cholesterol (bad cholesterol) goal below 70 mg/dL.   Continue to do occupational therapy exercises - I will reach out to your prior OT Adelina Mings to see if there would be any benefit with doing some additional therapies at this point     Followup in the future with me in 1 year or call earlier if needed       Thank you for coming to see [place] at [facility]. I hope we have been able to provide you high quality care today.  You may receive a patient satisfaction survey over the next few weeks. We would appreciate your feedback and comments so that we may continue to improve ourselves and the health of our patients.

## 2021-09-02 NOTE — Progress Notes (Signed)
[facility] [address] [place]. [place] [address] [phone]       STROKE FOLLOW UP NOTE  [name] Short. [name] Short Date of Birth:  25-Oct-1945 Medical Record Number:  [id number]   Reason for Referral: stroke follow up    SUBJECTIVE:   CHIEF COMPLAINT:  Chief Complaint  Patient presents with   Follow-up    RM 3 alone PT is well and stable, states nothing has changed      HPI:   Update 09/02/2021 JM: Returns for 68-month stroke follow-up unaccompanied  Stable from stroke standpoint -denies new stroke/TIA symptoms Continued left hand pointer and middle finger "sticks of wood" which interferes with functioning and fine manipulation and very frustrating to patient and left face feels like swollen after eating (feels like no space in between his cheek and gums) can last up to an hour but does not see swelling when looking in a mirror Due to continued left hand numbness at prior visit - completed EMG/NCV 02/2021 which showed very mild left median neuropathy consistent with very mild left carpal tunnel syndrome and severe right median neuropathy at the wrist consistent with severe right carpal tunnel syndrome (consistent with prior diagnosis and history of surgery)  Compliant on Eliquis and Crestor -denies side effects Blood pressure today 131/82  No new concerns at this time    History provided for reference purposes only Update 02/26/2021 JM: [name] Short. [name] Short returns for 33-month stroke follow-up accompanied by his wife  Doing well since prior visit without new stroke/TIA symptoms Residual left hand weakness with sensory impairment, left facial weakness and mild dysarthria with improvement since prior visit and has since completed all therapies.  Concern of left hand sensory impairment possible residual stroke symptoms vs carpal tunnel.  Initially experienced numbness throughout his entire left hand and now only experiencing symptoms in his thumb, index finger and  middle finger which can worsen with activity.  Denies any painful associated symptoms.  He did not experience any symptoms prior to his stroke.  He does have history of right carpal tunnel release in 03/2020.   Compliant on Eliquis and Crestor without associated side effects Blood pressure today 132/84  Also reports chronic lower back pain which does not radiate into his legs or denies sciatica type symptoms.  Used to treat pain with ibuprofen but has not been able to take since starting Eliquis.  Denies any progressive or worsening symptoms.  No further concerns at this time   Initial visit 11/26/2020 JM: [name] Short. [name] Short is being seen for hospital follow-up accompanied by his wife.  Reports residual dysarthria (slight), left sided facial weakness, and LUE/hand weakness Currently working with [employer] PT/OT/SLP slowly progressing towards goals Initial improvement of ambulation since discharge and able to ambulate without AD but last week dx'd with right foot gout which impacted his ambulation and is currently using RW - gout pain has been slowly improving He does question potential return to driving Denies new or worsening stroke/TIA symptoms  Compliance on Eliquis 5 mg twice daily without bleeding or bruising Compliant on Crestor 20 mg daily without myalgias Blood pressure today 123/77 - does not routinely monitor at home as typically stable  Follow-up visit scheduled with cardiology tomorrow No follow-up with PCP since discharge  No further concerns at this time  Stroke admission 10/21/2020 [name] Short is a 75 y.o. male with medical history significant of atrial fibrillation (not on AC), HLD, gout, OA, BPH, GERD. He had bilateral inquinal hernia repair 10/16/20.  EKG 10/16/20 revealed A. Fib. On 10/21/2020, he presented with slurred speech and left  hand weakness that started on awakening from a nap.  Personally reviewed hospitalization pertinent progress notes, lab work and imaging with  summary provided. Evaluated by [name]. 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 Initially diagnosed with PAF 2011 during stress test asymptomatic therefore placed on aspirin. Recommended initiating Eliquis 5 mg twice daily in setting of recent embolic stroke. LDL 98 and initiated Crestor 20 mg daily. A1c 5.6. Other stroke risk factors include advanced age and cigar smoker x15 yrs. Residual deficits of mild dysarthria, left facial weakness, and LUE weakness. Evaluated by therapies who recommended discharge home with outpatient PT/OT/SLP.  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  CTA neck No acute vascular finding within the neck. CTA head High-grade narrowing of right M3 branch with non opacification Distally. CT Perfusion Acute right frontal infarct, core of 6 mL. MRI Moderate acute infarct in the posterior right frontal lobe.  2D Echo EF 60 to 65%. In afib at time of study On aspirin 81 mg PTA. Now on Eliquis  5mg  BID  in setting of recent embolic stroke LDL 98 123456 5.6 Therapy recommendations: Outpatient PT/OT/SLP Disposition:  home     ROS:   14 system review of systems performed and negative with exception of those listed in HPI  PMH:  Past Medical History:  Diagnosis Date   Arthritis    WRIST   Benign localized prostatic hyperplasia with lower urinary tract symptoms (LUTS)    Complication of anesthesia    slow to wake   ED (erectile dysfunction)    GERD (gastroesophageal reflux disease)    History of concussion    AS CHILD--  NO RESIDUAL   History of gout    History of kidney stones    Hyperlipidemia    Inguinal hernia, bilateral    Nephrolithiasis    BILATERAL   PAF (paroxysmal atrial fibrillation) ([place]) currently being followed by pcp   EPISODE --  2011  FOLLOW-UP W/ [name] Short  /   HAS NOT SEEN ANY CARDIOLOGIST SINCE    PSH:  Past Surgical History:  Procedure Laterality Date   APPENDECTOMY  1983   CARDIOVASCULAR STRESS TEST  05-08-2010  [name] Short   NO EVIDENCE OF SCAR OR ISCHEMIA/ EF 54%/  PT HAD BOTH AFIB/ AFLUTTER DURING STUDY   CARPAL TUNNEL RELEASE Right 01/ 14/ 2021   CYSTOSCOPY W/ URETERAL STENT PLACEMENT Left 09/20/2013   Procedure: CYSTOSCOPY WITH STENT REPLACEMENT;  Surgeon: Fredricka Bonine, MD;  Location: [facility];  Service: Urology;  Laterality: Left;   CYSTOSCOPY WITH URETEROSCOPY Left 09/20/2013   Procedure: CYSTOSCOPY WITH URETEROSCOPY;  Surgeon: Fredricka Bonine, MD;  Location: [facility];  Service: Urology;  Laterality: Left;   CYSTOSCOPY WITH URETEROSCOPY AND STENT PLACEMENT Left 07/12/2013   Procedure: CYSTOSCOPY WITH LITHOLAPEXY, LEFT URETERAL STENT PLACEMENT;  Surgeon: Fredricka Bonine, MD;  Location: [facility];  Service: Urology;  Laterality: Left;   CYSTOSCOPY WITH URETEROSCOPY AND STENT PLACEMENT Bilateral 06/23/2014   Procedure: BILATERAL URETEROSCOPY, HOLMIUM LASER LITHOTRIPSY AND STENT PLACEMENT, RIGHT ;  Surgeon: Festus Aloe, MD;  Location[facility];  Service: Urology;  Laterality: Bilateral;   EXTRACORPOREAL SHOCK WAVE LITHOTRIPSY  X2   EXTRACORPOREAL SHOCK WAVE LITHOTRIPSY  Left 08-29-2013;   07-28-2013   EXTRACORPOREAL SHOCK WAVE LITHOTRIPSY Right 12/09/2018   Procedure: EXTRACORPOREAL SHOCK WAVE LITHOTRIPSY (ESWL);  Surgeon: Jerilee FieldEskridge, Matthew, MD;  Location: [facility];  Service: Urology;  Laterality: Right;   HOLMIUM LASER APPLICATION Left 09/20/2013   Procedure: HOLMIUM LASER APPLICATION;  Surgeon: Antony HasteMatthew Ramsey Eskridge, MD;  [facility];  Service: Urology;  Laterality: Left;   INGUINAL HERNIA REPAIR Bilateral 10/16/2020   Procedure: BILATERAL OPEN INGUINAL HERNIA REPAIR WITH MESH;  Surgeon: Abigail MiyamotoBlackman, Douglas, MD;  [facility];  Service: General;  Laterality: Bilateral;  LMA/TAP BLOCK   LAPAROSCOPIC INGUINAL HERNIA REPAIR Bilateral 09-04-2010   w/ mesh   TONSILLECTOMY  AS CHILD   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   TOTAL HIP ARTHROPLASTY Right 11/15/2012   Procedure: RIGHT TOTAL HIP ARTHROPLASTY ANTERIOR APPROACH;  Surgeon: Eldred MangesMark C Yates, MD;  Location: [facility];  Service: Orthopedics;  Laterality: Right;  Right total hip arthroplasty   TRANSTHORACIC ECHOCARDIOGRAM  05-08-2010   NORMAL LVSF/  EF 55%/  GRADE I DIASTOLIC DYSFUNCTION/  MILD BILATERAL ATR[facility]UM ENLARGEMENT    Social History:  Social History   Socioeconomic History   Marital status: Married    Spouse name: Not on file   Number of children: Not on file   Years of education: Not on file   Highest education level: Not on file  Occupational History   Occupation: retired  Tobacco Use   Smoking status: Light Smoker    Types: Cigars   Smokeless tobacco: Never   Tobacco comments:    AVERAGE 3 CIGARS PER WEEK  Vaping Use   Vaping Use: Never used  Substance and Sexual Activity   Alcohol use: Not Currently    Comment: occasional   Drug use: No   Sexual activity: Not on file  Other Topics Concern   Not on file  Social History Narrative   Not on file   Social Determinants of Health   Financial Resource Strain: Not on file  Food Insecurity: Not on file  Transportation Needs: Not on file  Physical Activity: Not on file  Stress: Not on file  Social Connections: Not on file  Intimate Partner Violence: Not on file    Family History:  Family History  Problem Relation Age of Onset   Diabetes Mother    Arthritis Mother    Rheum arthritis Mother    Dementia Mother    Heart disease Father    Prostate cancer Father    Diabetes Sister    Diabetes Maternal Grandmother    Diabetes Maternal Grandfather    Bipolar disorder Brother    Diabetes Brother    Dementia Brother    Neuropathy Brother     Medications:    Current Outpatient Medications on File Prior to Visit  Medication Sig Dispense Refill   acetaminophen (TYLENOL) 500 MG tablet Take 1 tablet (500 mg total) by mouth every 6 (six) hours as needed. 30 tablet 0   apixaban (ELIQUIS) 5 MG TABS tablet Take 1 tablet (5 mg total) by mouth 2 (two) times daily. 180 tablet 1   Cimetidine (TAGAMET PO) Take 1 tablet by mouth daily as needed (for reflux).     Colchicine 0.6 MG CAPS Take 0.6 mg by mouth daily as needed (for gout flares).     metoprolol succinate (TOPROL XL) 25 MG 24 hr tablet Take 1 tablet (25 mg total) by mouth daily. 90 tablet 3   Multiple Vitamin (MULTIVITAMIN WITH MINERALS)  TABS Take 1 tablet by mouth daily.     Polyethylene Glycol 400 (BLINK TEARS) 0.25 % SOLN Place 1 drop into both eyes 2 (two) times daily as needed (for dryness or irritation).     rosuvastatin (CRESTOR) 20 MG tablet Take 1 tablet (20 mg total) by mouth daily. 90 tablet 3   silodosin (RAPAFLO) 8 MG CAPS capsule Take 8 mg by mouth daily.     tadalafil (CIALIS) 5 MG tablet Take 5 mg by mouth daily as needed for erectile dysfunction.     No current facility-administered medications on file prior to visit.    Allergies:   Allergies  Allergen Reactions   Shellfish-Derived Products Other (See Comments)    Gout    Warfarin Sodium Rash      OBJECTIVE:  Physical Exam  Vitals:   09/02/21 1350  BP: 131/82  Pulse: (!) 52  Weight: 178 lb (80.7 kg)  Height: 5\' 7"  (1.702 m)    Body mass index is 27.88 kg/m. No results found.  General: well developed, well nourished,  very pleasant elderly Caucasian male, seated, in no evident distress Head: head normocephalic and atraumatic.   Neck: supple with no carotid or supraclavicular bruits Cardiovascular: regular rate and rhythm, no murmurs Musculoskeletal: no deformity Skin:  no rash/petichiae Vascular:  Normal pulses all extremities   Neurologic Exam Mental Status: Awake and fully alert.  Very slight dysarthria  and mild hypophonia.  Oriented to place and time. Recent and remote memory intact. Attention span, concentration and fund of knowledge appropriate. Mood and affect appropriate.  Cranial Nerves: Pupils equal, briskly reactive to light. Extraocular movements full without nystagmus. Visual fields full to confrontation. Hearing intact. Facial sensation intact.  Left lower facial weakness.  Tongue, and palate moves normally and symmetrically.  Motor: Normal bulk and tone. Normal strength in all tested extremity muscles except slightly decreased left hand grip strength and decreased dexterity Sensory.:  Decreased pinprick sensation left pointer finger and middle finger distally otherwise all other extremities intact to touch , pinprick , position and vibratory sensation.  Coordination: Rapid alternating movements normal in all extremities except slightly decreased left hand Dexterity. Finger-to-nose mild dysmetria LUE and heel-to-shin performed accurately bilaterally. Gait and Station: Arises from chair without difficulty. Stance is normal. Gait demonstrates normal stride length and balance without use of assistive device.  Tandem walk and heel toe with mild difficulty Reflexes: 1+ and symmetric. Toes downgoing.        ASSESSMENT: CALUM STLOUIS is a 75 y.o. 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 Vascular risk factors include PAF not on AC, HLD, advanced age, and cigar smoker.     PLAN:  R frontal stroke, embolic:  Residual deficit: Left hand weakness with sensory impairment, mild dysarthria and left facial weakness.  Will reach out to prior OT to see if any benefit with additional therapy more so to learn compensation strategies. As his stroke was almost 1 year ago now, he will likely not see any further recovery  Continue Eliquis (apixaban) daily   and Crestor for secondary stroke prevention.   Discussed secondary stroke prevention measures and importance of close PCP follow up for aggressive stroke risk factor management  PAF: On Eliquis 5 mg twice daily for CHA2DS2-VASc score of at least 3.  Continue to follow with cardiology HTN: BP goal <130/90.  Well-controlled monitored by  PCP HLD: LDL goal <70. Currently on Crestor 20 mg daily with recent LDL 49 managed and monitored by PCP   Follow up in 1 year or call earlier if needed   CC:  PCP: Midge Minium, MD    I spent 34 minutes of face-to-face and non-face-to-face time with patient.  This included previsit chart review, lab review, study review, order entry, electronic health record documentation, patient education and discussion regarding history of prior stroke with residual deficits, secondary stroke prevention measures and aggressive stroke risk factor management, and answered all other questions to patient's satisfaction   Frann Rider, [facility]  [facility] [address] [place], [place] [address]  Phone [phone] Fax [phone] Note: This document was prepared with digital dictation and possible smart phrase technology. Any transcriptional errors that result from this process are unintentional.

## 2021-09-03 ENCOUNTER — Telehealth: Payer: Self-pay | Admitting: Adult Health

## 2021-09-03 NOTE — Telephone Encounter (Signed)
At recent visit, discussed restarting therapies to help with compensation strategies due to continued difficulties with left hand function. Per prior OT Rosie Fate, she believes additional therapy could be beneficial for patient if he feels he is more limited functionally than he was when she saw him last in the spring.    Can you please let patient know the above and if interested in pursuing therapy to let me know and I will place order. Thank you!

## 2021-09-04 NOTE — Telephone Encounter (Signed)
Contacted pt, LVM rq call back  

## 2021-09-05 NOTE — Telephone Encounter (Signed)
Contacted pt again, LVM rq cal lback.

## 2021-09-05 NOTE — Telephone Encounter (Addendum)
Contacted alt number (spouse). No answer, will send pt MyChart message.

## 2021-09-18 ENCOUNTER — Telehealth: Payer: Self-pay | Admitting: Family Medicine

## 2021-09-18 NOTE — Telephone Encounter (Signed)
Left message for patient to call back and schedule Medicare Annual Wellness Visit (AWV) in office.  ° °If not able to come in office, please offer to do virtually or by telephone.  Left office number and my jabber #[phone]. ° °Last AWV:08/13/2020 ° °Please schedule at anytime with Nurse Health Advisor. °  °

## 2021-09-27 ENCOUNTER — Telehealth: Payer: Medicare Other | Admitting: Nurse Practitioner

## 2021-09-27 ENCOUNTER — Telehealth: Payer: Medicare Other | Admitting: Physician Assistant

## 2021-09-27 ENCOUNTER — Ambulatory Visit (HOSPITAL_COMMUNITY): Admit: 2021-09-27 | Discharge status: Against Medical Advice | Payer: Medicare Other

## 2021-09-27 DIAGNOSIS — U071 COVID-19: Secondary | ICD-10-CM

## 2021-09-27 MED ORDER — MOLNUPIRAVIR EUA 200MG CAPSULE: 4.0000 | ORAL_CAPSULE | Freq: Two times a day (BID) | ORAL | 0 refills | Status: AC

## 2021-09-27 MED ORDER — BENZONATATE 100 MG PO CAPS: 100.0000 mg | ORAL_CAPSULE | Freq: Three times a day (TID) | ORAL | 0 refills | Status: DC | PRN

## 2021-09-27 NOTE — Progress Notes (Signed)
Virtual Visit Consent   [name] Short, you are scheduled for a virtual visit with a [facility] provider today.     Just as with appointments in the office, your consent must be obtained to participate.  Your consent will be active for this visit and any virtual visit you may have with one of our providers in the next 365 days.     If you have a MyChart account, a copy of this consent can be sent to you electronically.  All virtual visits are billed to your insurance company just like a traditional visit in the office.    As this is a virtual visit, video technology does not allow for your provider to perform a traditional examination.  This may limit your provider's ability to fully assess your condition.  If your provider identifies any concerns that need to be evaluated in person or the need to arrange testing (such as labs, EKG, etc.), we will make arrangements to do so.     Although advances in technology are sophisticated, we cannot ensure that it will always work on either your end or our end.  If the connection with a video visit is poor, the visit may have to be switched to a telephone visit.  With either a video or telephone visit, we are not always able to ensure that we have a secure connection.     I need to obtain your verbal consent now.   Are you willing to proceed with your visit today?    Theophilus Kinds Cino has provided verbal consent on 09/27/2021 for a virtual visit (telephone). Unable to connect over video visit was performed via telephone    Apolonio Schneiders, FNP   Date: 09/27/2021 5:11 PM   Virtual Visit via Video Note   I, Apolonio Schneiders, connected with  [name] Short  ([id number], 1945/11/01) on 09/27/21 at  5:15 PM EST by a video-enabled telemedicine application and verified that I am speaking with the correct person using two identifiers.  Location: Patient: Virtual Visit Location Patient: Home Provider: Virtual Visit Location Provider: Home Office   I  discussed the limitations of evaluation and management by telemedicine and the availability of in person appointments. The patient expressed understanding and agreed to proceed.    History of Present Illness: [name] Short is a 75 y.o. who identifies as a male who was assigned male at birth, and is being seen today for COVID treatment.   He tested positive today   His symptoms started yesterday   Symptoms include nasal congestion, cough, low grade fever, and chills  He has not had COVID in the past  He has been vaccinated x5 for COVID including the recent booster in October   GFR 64  Problems:  Patient Active Problem List   Diagnosis Date Noted   Recurrent kidney stones 08/14/2021   History of cerebrovascular accident (CVA) with residual deficit 02/11/2021   CVA (cerebral vascular accident) ([place]) 10/21/2020   Bilateral primary osteoarthritis of knee 02/15/2020   Acquired trigger finger of right little finger 11/03/2019   Encounter for orthopedic follow-up care 10/13/2019   H/O total hip arthroplasty, bilateral 10/24/2018   Gout 08/18/2017   Overweight (BMI 25.0-29.9) 01/23/2017   Foot pain, left 02/11/2016   Sebaceous cyst 07/14/2014   Sun-damaged skin 07/14/2014   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/14/2014   Physical exam, annual 05/17/2012   Inguinal hernia 08/12/2010   HYPERLIPIDEMIA TYPE I / IV 05/01/2010   TOBACCO USER 05/01/2010  Paroxysmal a-fib ([facility]) 05/01/2010   ABNORMAL ELECTROCARDIOGRAM 03/26/2010    Allergies:  Allergies  Allergen Reactions   Shellfish-Derived Products Other (See Comments)    Gout    Warfarin Sodium Rash   Medications:  Current Outpatient Medications:    acetaminophen (TYLENOL) 500 MG tablet, Take 1 tablet (500 mg total) by mouth every 6 (six) hours as needed., Disp: 30 tablet, Rfl: 0   apixaban (ELIQUIS) 5 MG TABS tablet, Take 1 tablet (5 mg total) by mouth 2 (two) times daily., Disp: 180 tablet, Rfl: 1   benzonatate (TESSALON) 100 MG  capsule, Take 1 capsule (100 mg total) by mouth 3 (three) times daily as needed for cough., Disp: 30 capsule, Rfl: 0   Cimetidine (TAGAMET PO), Take 1 tablet by mouth daily as needed (for reflux)., Disp: , Rfl:    Colchicine 0.6 MG CAPS, Take 0.6 mg by mouth daily as needed (for gout flares)., Disp: , Rfl:    metoprolol succinate (TOPROL XL) 25 MG 24 hr tablet, Take 1 tablet (25 mg total) by mouth daily., Disp: 90 tablet, Rfl: 3   Multiple Vitamin (MULTIVITAMIN WITH MINERALS) TABS, Take 1 tablet by mouth daily., Disp: , Rfl:    Polyethylene Glycol 400 (BLINK TEARS) 0.25 % SOLN, Place 1 drop into both eyes 2 (two) times daily as needed (for dryness or irritation)., Disp: , Rfl:    rosuvastatin (CRESTOR) 20 MG tablet, Take 1 tablet (20 mg total) by mouth daily., Disp: 90 tablet, Rfl: 3   silodosin (RAPAFLO) 8 MG CAPS capsule, Take 8 mg by mouth daily., Disp: , Rfl:    tadalafil (CIALIS) 5 MG tablet, Take 5 mg by mouth daily as needed for erectile dysfunction., Disp: , Rfl:   Observations/Objective: Patient is well-developed, well-nourished in no acute distress.  Resting comfortably at home.  Head is normocephalic, atraumatic.  No labored breathing.  Speech is clear and coherent with logical content.  Patient is alert and oriented at baseline.    Assessment and Plan: 1. COVID-19 Take anti-viral with food  - molnupiravir EUA (LAGEVRIO) 200 mg CAPS capsule; Take 4 capsules (800 mg total) by mouth 2 (two) times daily for 5 days.  Dispense: 40 capsule; Refill: 0    Continue to manage with over the counter medications as discussed.  Push fluids  Rest  Assure adequate caloric intake daily and immune support as discussed    Follow Up Instructions: I discussed the assessment and treatment plan with the patient. The patient was provided an opportunity to ask questions and all were answered. The patient agreed with the plan and demonstrated an understanding of the instructions.  A copy of  instructions were sent to the patient via MyChart unless otherwise noted below.     The patient was advised to call back or seek an in-person evaluation if the symptoms worsen or if the condition fails to improve as anticipated.  Time:  I spent 10 minutes with the patient via telehealth technology discussing the above problems/concerns.    Viviano Simas, FNP

## 2021-09-27 NOTE — Progress Notes (Signed)
I have spent 5 minutes in review of e-visit questionnaire, review and updating patient chart, medical decision making and response to patient.   Jamelle Noy Cody Macallan Ord, PA-C    

## 2021-09-27 NOTE — Progress Notes (Signed)
E-Visit  for Positive Covid Test Result  We are sorry you are not feeling well. We are here to help!  You have tested positive for COVID-19, meaning that you were infected with the novel coronavirus and could give the virus to others.  It is vitally important that you stay home so you do not spread it to others.      Please continue isolation at home, for at least 10 days since the start of your symptoms and until you have had 24 hours with no fever (without taking a fever reducer) and with improving of symptoms.  If you have no symptoms but tested positive (or all symptoms resolve after 5 days and you have no fever) you can leave your house but continue to wear a mask around others for an additional 5 days. If you have a fever,continue to stay home until you have had 24 hours of no fever. Most cases improve 5-10 days from onset but we have seen a small number of patients who have gotten worse after the 10 days.  Please be sure to watch for worsening symptoms and remain taking the proper precautions.   Go to the nearest hospital ED for assessment if fever/cough/breathlessness are severe or illness seems like a threat to life.    The following symptoms may appear 2-14 days after exposure: Fever Cough Shortness of breath or difficulty breathing Chills Repeated shaking with chills Muscle pain Headache Sore throat New loss of taste or smell Fatigue Congestion or runny nose Nausea or vomiting Diarrhea  You have been enrolled in [facility] for COVID-19. Daily you will receive a questionnaire within the [facility] website. Our COVID-19 response team will be monitoring your responses daily.  You can use medication such as prescription cough medication called Tessalon Perles 100 mg. You may take 1-2 capsules every 8 hours as needed for cough  Giving your age and health history, you would be someone for which antivirals should be considered. As noted in your e-visit questionnaire,  this is not something we can prescribe via e-visit and so if you are wanting to discuss antivirals, we will need you to submit a video urgent care visit through your Mychart (on menu of your homepage). If you do a video visit, you will not be charged for the e-visit.   You may also take acetaminophen (Tylenol) as needed for fever.  HOME CARE: Only take medications as instructed by your medical team. Drink plenty of fluids and get plenty of rest. A steam or ultrasonic humidifier can help if you have congestion.   GET HELP RIGHT AWAY IF YOU HAVE EMERGENCY WARNING SIGNS.  Call 911 or proceed to your closest emergency facility if: You develop worsening high fever. Trouble breathing Bluish lips or face Persistent pain or pressure in the chest New confusion Inability to wake or stay awake You cough up blood. Your symptoms become more severe Inability to hold down food or fluids  This list is not all possible symptoms. Contact your medical provider for any symptoms that are severe or concerning to you.    Your e-visit answers were reviewed by a board certified advanced clinical practitioner to complete your personal care plan.  Depending on the condition, your plan could have included both over the counter or prescription medications.  If there is a problem please reply once you have received a response from your provider.  Your safety is important to [place].  If you have drug allergies check your  prescription carefully.    You can use MyChart to ask questions about today's visit, request a non-urgent call back, or ask for a work or school excuse for 24 hours related to this e-Visit. If it has been greater than 24 hours you will need to follow up with your provider, or enter a new e-Visit to address those concerns. You will get an e-mail in the next two days asking about your experience.  I hope that your e-visit has been valuable and will speed your recovery. Thank you for using  e-visits.

## 2021-10-02 ENCOUNTER — Telehealth: Payer: Self-pay | Admitting: Pharmacist

## 2021-10-02 NOTE — Progress Notes (Signed)
Chronic Care Management Pharmacy Assistant   Name: [name] Short  MRN: [id number] DOB: 10-07-1945   Reason for Encounter: Disease State - General Adherence Call     Recent office visits:  09/27/21 Viviano Simas, FNP - Family Medicine (Video visit) - COVID 19 - molnupiravir EUA (LAGEVRIO) 200 mg CAPS capsule prescribed. Follow up as needed.   08/14/21 Neena Rhymes, MD - Family Medicine - Hyperlipidemia - labs were ordered. Cologuard given. No medication changes. Follow up in 6 months.   Recent consult visits:  09/02/21 Ihor Austin, NP - Neurology - Ischemic Stroke - No medication changes. Follow up in 1 year.   08/29/21 Epifanio Lesches, MD - Cardiology - Paroxysmal A fib - EKG performed - No medication changes. Follow up in 6 months.    Hospital visits: 09/27/21 Medication Reconciliation was completed by comparing discharge summary, patients EMR and Pharmacy list, and upon discussion with patient.  Admitted to the hospital on 09/27/21 due to 09/28/21. Discharge date was 09/28/21 from [facility],  New?Medications Started at [facility] Discharge:?? No note available  Medication Changes at Hospital Discharge: No note available  Medications Discontinued at Hospital Discharge: No note availbale  Medications that remain the same after Hospital Discharge:??  All other medications will remain the same.    Medications: Outpatient Encounter Medications as of 10/02/2021  Medication Sig   apixaban (ELIQUIS) 5 MG TABS tablet Take 1 tablet (5 mg total) by mouth 2 (two) times daily.   benzonatate (TESSALON) 100 MG capsule Take 1 capsule (100 mg total) by mouth 3 (three) times daily as needed for cough.   Cimetidine (TAGAMET PO) Take 1 tablet by mouth daily as needed (for reflux).   Colchicine 0.6 MG CAPS Take 0.6 mg by mouth daily as needed (for gout flares).   metoprolol succinate (TOPROL XL) 25 MG 24 hr tablet Take 1 tablet (25 mg total) by mouth daily.    molnupiravir EUA (LAGEVRIO) 200 mg CAPS capsule Take 4 capsules (800 mg total) by mouth 2 (two) times daily for 5 days.   Multiple Vitamin (MULTIVITAMIN WITH MINERALS) TABS Take 1 tablet by mouth daily.   Polyethylene Glycol 400 (BLINK TEARS) 0.25 % SOLN Place 1 drop into both eyes 2 (two) times daily as needed (for dryness or irritation).   rosuvastatin (CRESTOR) 20 MG tablet Take 1 tablet (20 mg total) by mouth daily.   silodosin (RAPAFLO) 8 MG CAPS capsule Take 8 mg by mouth daily.   tadalafil (CIALIS) 5 MG tablet Take 5 mg by mouth daily as needed for erectile dysfunction.   No facility-administered encounter medications on file as of 10/02/2021.    Have you had any problems recently with your health? Patient reported he was recently diagnosed with COVID 19 but is doing better. He is on his last day of medication today.   Have you had any problems with your pharmacy? Patient denied any problems with his current pharmacy.   What issues or side effects are you having with your medications? Patient denied any side effects or issues with his current medications.   What would you like me to pass along to Erskine Emery, CPP for them to help you with?  Patient did not have anything to pass along to CPP at this time.   What can we do to take care of you better? Patient stated he was currently satisfied with his current level of care and had no recommendations at this time.   Care Gaps  AWV: done 09/18/21 Colonoscopy: done 07/15/07 DM Eye Exam: N/A DM Foot Exam: N/A Microalbumin: N/A HbgAIC: done 10/21/20 (5.6) DEXA: N/A Mammogram: N/a   Star Rating Drugs: rosuvastatin (CRESTOR) 20 MG tablet - last filled 09/08/21 90 days   Future Appointments  Date Time Provider Department Center  02/12/2022  9:30 AM Sheliah Hatch, MD [facility]  03/12/2022  8:20 AM Little Ishikawa, MD [facility]  09/03/2022  1:45 PM Ihor Austin, NP [facility] None   Eugenie Filler,  [facility] Clinical Pharmacist Assistant  [phone]

## 2021-10-31 DIAGNOSIS — N5201 Erectile dysfunction due to arterial insufficiency: Secondary | ICD-10-CM | POA: Diagnosis not present

## 2021-10-31 DIAGNOSIS — R3121 Asymptomatic microscopic hematuria: Secondary | ICD-10-CM | POA: Diagnosis not present

## 2021-10-31 DIAGNOSIS — N401 Enlarged prostate with lower urinary tract symptoms: Secondary | ICD-10-CM | POA: Diagnosis not present

## 2021-10-31 DIAGNOSIS — N200 Calculus of kidney: Secondary | ICD-10-CM | POA: Diagnosis not present

## 2021-10-31 DIAGNOSIS — R3914 Feeling of incomplete bladder emptying: Secondary | ICD-10-CM | POA: Diagnosis not present

## 2021-11-27 DIAGNOSIS — Z20822 Contact with and (suspected) exposure to covid-19: Secondary | ICD-10-CM | POA: Diagnosis not present

## 2021-12-05 ENCOUNTER — Telehealth: Payer: Self-pay | Admitting: *Deleted

## 2021-12-05 NOTE — Telephone Encounter (Signed)
Patient approved for patient assistance/[name] Short (Eliquis) ?12/02/21-09/28/22 ? ?Application case #: [id number] ? ?

## 2022-01-14 DIAGNOSIS — Z20822 Contact with and (suspected) exposure to covid-19: Secondary | ICD-10-CM | POA: Diagnosis not present

## 2022-01-21 ENCOUNTER — Telehealth: Payer: Medicare Other | Admitting: Family Medicine

## 2022-02-03 DIAGNOSIS — N401 Enlarged prostate with lower urinary tract symptoms: Secondary | ICD-10-CM | POA: Diagnosis not present

## 2022-02-03 DIAGNOSIS — R3121 Asymptomatic microscopic hematuria: Secondary | ICD-10-CM | POA: Diagnosis not present

## 2022-02-03 DIAGNOSIS — R3914 Feeling of incomplete bladder emptying: Secondary | ICD-10-CM | POA: Diagnosis not present

## 2022-02-03 DIAGNOSIS — N200 Calculus of kidney: Secondary | ICD-10-CM | POA: Diagnosis not present

## 2022-02-12 ENCOUNTER — Ambulatory Visit (INDEPENDENT_AMBULATORY_CARE_PROVIDER_SITE_OTHER): Payer: Medicare Other | Admitting: Family Medicine

## 2022-02-12 ENCOUNTER — Encounter: Payer: Self-pay | Admitting: Family Medicine

## 2022-02-12 VITALS — BP 118/60 | HR 61 | Temp 97.7°F | Resp 17 | Ht 67.0 in | Wt 177.4 lb

## 2022-02-12 DIAGNOSIS — M7661 Achilles tendinitis, right leg: Secondary | ICD-10-CM

## 2022-02-12 DIAGNOSIS — E783 Hyperchylomicronemia: Secondary | ICD-10-CM

## 2022-02-12 DIAGNOSIS — M1000 Idiopathic gout, unspecified site: Secondary | ICD-10-CM | POA: Diagnosis not present

## 2022-02-12 DIAGNOSIS — I480 Paroxysmal atrial fibrillation: Secondary | ICD-10-CM

## 2022-02-12 LAB — BASIC METABOLIC PANEL
BUN: 11 mg/dL (ref 6–23)
CO2: 29 mEq/L (ref 19–32)
Calcium: 9.4 mg/dL (ref 8.4–10.5)
Chloride: 103 mEq/L (ref 96–112)
Creatinine, Ser: 1.04 mg/dL (ref 0.40–1.50)
GFR: 70.79 mL/min (ref >60.00)
Glucose, Bld: 86 mg/dL (ref 70–99)
Potassium: 4.1 mEq/L (ref 3.5–5.1)
Sodium: 141 mEq/L (ref 135–145)

## 2022-02-12 LAB — CBC WITH DIFFERENTIAL/PLATELET
Basophils Absolute: 0.2 10*3/uL — ABNORMAL HIGH (ref 0.0–0.1)
Basophils Relative: 1.8 % (ref 0.0–3.0)
Eosinophils Absolute: 0.1 10*3/uL (ref 0.0–0.7)
Eosinophils Relative: 0.8 % (ref 0.0–5.0)
HCT: 40.8 % (ref 39.0–52.0)
Hemoglobin: 13.3 g/dL (ref 13.0–17.0)
Lymphocytes Relative: 15.4 % (ref 12.0–46.0)
Lymphs Abs: 1.7 10*3/uL (ref 0.7–4.0)
MCHC: 32.6 g/dL (ref 30.0–36.0)
MCV: 90.1 fl (ref 78.0–100.0)
Monocytes Absolute: 0.9 10*3/uL (ref 0.1–1.0)
Monocytes Relative: 8.2 % (ref 3.0–12.0)
Neutro Abs: 8.4 10*3/uL — ABNORMAL HIGH (ref 1.4–7.7)
Neutrophils Relative %: 73.8 % (ref 43.0–77.0)
Platelets: 199 10*3/uL (ref 150.0–400.0)
RBC: 4.53 Mil/uL (ref 4.22–5.81)
RDW: 13.1 % (ref 11.5–15.5)
WBC: 11.3 10*3/uL — ABNORMAL HIGH (ref 4.0–10.5)

## 2022-02-12 LAB — LIPID PANEL
Cholesterol: 119 mg/dL (ref 0–200)
HDL: 39.6 mg/dL (ref >39.00)
LDL Cholesterol: 42 mg/dL (ref 0–99)
NonHDL: 79.43
Total CHOL/HDL Ratio: 3
Triglycerides: 187 mg/dL — ABNORMAL HIGH (ref 0.0–149.0)
VLDL: 37.4 mg/dL (ref 0.0–40.0)

## 2022-02-12 LAB — HEPATIC FUNCTION PANEL
ALT: 14 U/L (ref 0–53)
AST: 16 U/L (ref 0–37)
Albumin: 4.4 g/dL (ref 3.5–5.2)
Alkaline Phosphatase: 79 U/L (ref 39–117)
Bilirubin, Direct: 0.1 mg/dL (ref 0.0–0.3)
Total Bilirubin: 0.6 mg/dL (ref 0.2–1.2)
Total Protein: 7.3 g/dL (ref 6.0–8.3)

## 2022-02-12 LAB — TSH: TSH: 3.8 u[IU]/mL (ref 0.35–5.50)

## 2022-02-12 MED ORDER — COLCHICINE 0.6 MG PO CAPS: 0.6000 mg | ORAL_CAPSULE | Freq: Every day | ORAL | 1 refills | Status: DC | PRN

## 2022-02-12 MED ORDER — ALLOPURINOL 100 MG PO TABS: 100.0000 mg | ORAL_TABLET | Freq: Every day | ORAL | 1 refills | Status: DC

## 2022-02-12 NOTE — Assessment & Plan Note (Signed)
Chronic problem.  Tolerating Crestor 20mg daily w/o difficulty.  Check labs.  Adjust meds prn  

## 2022-02-12 NOTE — Assessment & Plan Note (Signed)
Pt is again having a flare on his R foot.  He has been treated w/ Colchicine in the past and would like this again.  Will start Allopurinol since he is in an active flare in hopes that this will decrease the frequency of future flares.  Pt expressed understanding and is in agreement w/ plan.  ?

## 2022-02-12 NOTE — Progress Notes (Signed)
? ?  Subjective:  ? ? Patient ID: [name] Short, male    DOB: 06-20-1946, 76 y.o.   MRN: [id number] ? ?HPI ?Hyperlipidemia- chronic problem.  On Crestor 20mg  daily.  No abd pain, N/V.  Denies myalgias  ? ?Gout- pt has hx of similar.  Has previously taken Colchicine as needed for flares.  Pt reports he developed sxs in R foot 2 weeks ago.  Pt is asking for refill on Colchicine.  Reports he is having Achilles tendonitis since he has altered his gait ? ?Afib- chronic problem.  Paroxysmal.  On Metoprolol XL 25mg  daily.  On Eliquis 5mg  BID.  Follows w/ Cards.  No CP, SOB, HAs, visual changes. ? ?Review of Systems ?For ROS see HPI  ?   ?Objective:  ? Physical Exam ?Vitals reviewed.  ?Constitutional:   ?   General: He is not in acute distress. ?   Appearance: Normal appearance. He is well-developed. He is not ill-appearing.  ?HENT:  ?   Head: Normocephalic and atraumatic.  ?Eyes:  ?   Extraocular Movements: Extraocular movements intact.  ?   Conjunctiva/sclera: Conjunctivae normal.  ?   Pupils: Pupils are equal, round, and reactive to light.  ?Neck:  ?   Thyroid: No thyromegaly.  ?Cardiovascular:  ?   Rate and Rhythm: Normal rate and regular rhythm.  ?   Pulses: Normal pulses.  ?   Heart sounds: Normal heart sounds. No murmur heard. ?Pulmonary:  ?   Effort: Pulmonary effort is normal. No respiratory distress.  ?   Breath sounds: Normal breath sounds.  ?Abdominal:  ?   General: Bowel sounds are normal. There is no distension.  ?   Palpations: Abdomen is soft.  ?Musculoskeletal:     ?   General: Swelling (swelling, redness and TTP over R 3rd-5th toes) present.  ?   Cervical back: Normal range of motion and neck supple.  ?   Right lower leg: No edema.  ?   Left lower leg: No edema.  ?Lymphadenopathy:  ?   Cervical: No cervical adenopathy.  ?Skin: ?   General: Skin is warm and dry.  ?Neurological:  ?   Mental Status: He is alert and oriented to person, place, and time. Mental status is at baseline.  ?   Comments: Speech is  slow and deliberate  ?Psychiatric:     ?   Mood and Affect: Mood normal.     ?   Behavior: Behavior normal.  ? ? ? ? ? ?   ?Assessment & Plan:  ? ?Achilles Tendonitis- new.  R foot.  Pt feels that he has caused an issue w/ his Achilles b/c he had altered his gait due to the gout.  Will refer to Ortho at pt request. ?

## 2022-02-12 NOTE — Patient Instructions (Signed)
Follow up in 6 months to recheck cholesterol ?We'll notify you of your lab results and make any changes if needed ?START the Colchicine as needed for pain ?TAKE the Allopurinol every day to try and avoid flares ?Drink LOTS of water to help w/ cramps ?We'll call you to schedule your Ortho appt ?Call with any questions or concerns ?Have a great summer!! ?

## 2022-02-12 NOTE — Assessment & Plan Note (Signed)
Pt is in NSR today and asymptomatic.  Continue Metoprolol XL 25mg  daily and Eliquis 5mg  BID. ?

## 2022-02-13 ENCOUNTER — Telehealth: Payer: Self-pay

## 2022-02-13 NOTE — Telephone Encounter (Signed)
-----   Message from Sheliah Hatch, MD sent at 02/12/2022  8:47 PM EDT ----- Labs look good w/ exception of elevated white blood cell count but this is likely due to your current gout flare.  No changes at this time

## 2022-02-17 ENCOUNTER — Telehealth: Payer: Self-pay

## 2022-02-17 NOTE — Telephone Encounter (Signed)
Worked patient in to be seen.

## 2022-02-17 NOTE — Telephone Encounter (Signed)
Patient's wife [facility]) called triage this morning. She said that her husband has been dealing with gout for a month now. He has been seeing his PCP. She said that it has continued to get worse and now is having a very hard time getting around. He always sees Dr.Yates. She is requesting a sooner appt than June 6th. She really wants tomorrow or Wednesday. Please call to advise patient if Dr.Yates can work him in or not. [facility] [phone]  Thanks!

## 2022-02-18 ENCOUNTER — Encounter: Payer: Self-pay | Admitting: Orthopaedic Surgery

## 2022-02-18 ENCOUNTER — Ambulatory Visit (INDEPENDENT_AMBULATORY_CARE_PROVIDER_SITE_OTHER): Payer: Medicare Other | Admitting: Orthopaedic Surgery

## 2022-02-18 ENCOUNTER — Ambulatory Visit: Payer: Self-pay

## 2022-02-18 VITALS — BP 136/77 | Ht 67.0 in | Wt 175.0 lb

## 2022-02-18 DIAGNOSIS — G8929 Other chronic pain: Secondary | ICD-10-CM | POA: Diagnosis not present

## 2022-02-18 DIAGNOSIS — M545 Low back pain, unspecified: Secondary | ICD-10-CM

## 2022-02-18 DIAGNOSIS — M25571 Pain in right ankle and joints of right foot: Secondary | ICD-10-CM

## 2022-02-18 NOTE — Progress Notes (Signed)
Office Visit Note   Patient: [name] Short           Date of Birth: 19-Jan-1946           MRN: [id number] Visit Date: 02/18/2022              Requested by: Sheliah Hatch, MD [address] [place] [address] [place],  [place] [address] PCP: Sheliah Hatch, MD   Assessment & Plan: Visit Diagnoses:  1. Chronic bilateral low back pain, unspecified whether sciatica present   2. Pain in right ankle and joints of right foot     Plan: Patient is on allopurinol 100 mg been on it less than a week.  We discussed typical gradual ramp up to a higher dose after being on it for a week or so gradually progressed to the 200 mg and then 300 mg.  He can talk with his PCP about getting a new uric acid drawn so the effectiveness of allopurinol can be measured.  We discussed 90% patients are therapeutic with 300 mg of allopurinol daily.  Pathophysiology of gout was discussed in detail.  His wife is taking notes.  He can follow-up with me as needed and can come back a few months if he still having some back pain symptoms since some of this also may be related to long-term hyperuricemia.  Follow-Up Instructions: No follow-ups on file.   Orders:  Orders Placed This Encounter  Procedures   XR Ankle Complete Right   XR Lumbar Spine 2-3 Views   No orders of the defined types were placed in this encounter.     Procedures: No procedures performed   Clinical Data: No additional findings.   Subjective: Chief Complaint  Patient presents with   Lower Back - Pain   Right Ankle - Pain    HPI 76 year old male who I previously did the hip replacements on years ago left was 2003 at that stroke he is on Eliquis.  He had diagnosis of gout which is involving his middle third toe PIP joint with past history of gout in his ankle gotten his foot and also on his finger.  No recent uric acid but he does have colchicine and was started on allopurinol.  Due to his Eliquis he not able to take regular nonsteroidal  anti-inflammatories.  Patient is here with his wife has had problems with back pain.  Past history of atrial fibs as well.  Patient's pain in his back is typically left-sided lower back occasionally right lower back.  No associated bowel or bladder symptoms.  Review of Systems on Eliquis all the systems noncontributory to HPI.  Previous CVA.   Objective: Vital Signs: BP 136/77   Ht 5\' 7"  (1.702 m)   Wt 175 lb (79.4 kg)   BMI 27.41 kg/m   Physical Exam Constitutional:      Appearance: He is well-developed.  HENT:     Head: Normocephalic and atraumatic.     Right Ear: External ear normal.     Left Ear: External ear normal.  Eyes:     Pupils: Pupils are equal, round, and reactive to light.  Neck:     Thyroid: No thyromegaly.     Trachea: No tracheal deviation.  Cardiovascular:     Rate and Rhythm: Normal rate.  Pulmonary:     Effort: Pulmonary effort is normal.     Breath sounds: No wheezing.  Abdominal:     General: Bowel sounds are normal.  Palpations: Abdomen is soft.  Musculoskeletal:     Cervical back: Neck supple.  Skin:    General: Skin is warm and dry.     Capillary Refill: Capillary refill takes less than 2 seconds.  Neurological:     Mental Status: He is alert and oriented to person, place, and time.  Psychiatric:        Behavior: Behavior normal.        Thought Content: Thought content normal.        Judgment: Judgment normal.    Ortho Exam erythema third toe with mild to moderate tenderness.  Tenderness at the Achilles tendon insertion site without palpable defect.  Subtalar motion ankle range of motion is normal.  No knee effusion.  Specialty Comments:  No specialty comments available.  Imaging: No results found.   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   Gout 08/18/2017   Overweight (BMI 25.0-29.9) 01/23/2017   Foot pain, left 02/11/2016   Sebaceous cyst 07/14/2014   Sun-damaged skin 07/14/2014   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/14/2014   Physical exam, annual 05/17/2012   Inguinal hernia 08/12/2010   HYPERLIPIDEMIA TYPE I / IV 05/01/2010   TOBACCO USER 05/01/2010   Paroxysmal a-fib (HCC) 05/01/2010   ABNORMAL ELECTROCARDIOGRAM 03/26/2010   Past Medical History:  Diagnosis Date   Arthritis    WRIST   Benign localized prostatic hyperplasia with lower urinary tract symptoms (LUTS)    Complication of anesthesia    slow to wake   ED (erectile dysfunction)    GERD (gastroesophageal reflux disease)    History of concussion    AS CHILD--  NO RESIDUAL   History of gout    History of kidney stones    Hyperlipidemia    Inguinal hernia, bilateral    Nephrolithiasis    BILATERAL   PAF (paroxysmal atrial fibrillation) (HCC) currently being followed by pcp   EPISODE --  2011  FOLLOW-UP W/ DR WALL  /  HAS NOT SEEN ANY CARDIOLOGIST SINCE    Family History  Problem Relation Age of Onset   Diabetes Mother    Arthritis Mother    Rheum arthritis Mother    Dementia Mother    Heart disease Father    Prostate cancer Father    Diabetes Sister    Diabetes Maternal Grandmother    Diabetes Maternal Grandfather    Bipolar disorder Brother    Diabetes Brother    Dementia Brother    Neuropathy Brother     Past Surgical History:  Procedure Laterality Date   APPENDECTOMY  1983   CARDIOVASCULAR STRESS TEST  05-08-2010  DR WALL   NO EVIDENCE OF SCAR OR ISCHEMIA/ EF 54%/  PT HAD BOTH AFIB/ AFLUTTER DURING STUDY   CARPAL TUNNEL RELEASE Right 01/ 14/ 2021   CYSTOSCOPY W/ URETERAL STENT PLACEMENT Left 09/20/2013   Procedure: CYSTOSCOPY  WITH STENT REPLACEMENT;  Surgeon: Antony HasteMatthew Ramsey Eskridge, MD;  [facility];  Service: Urology;  Laterality: Left;   CYSTOSCOPY WITH URETEROSCOPY Left 09/20/2013   Procedure: CYSTOSCOPY WITH URETEROSCOPY;  Surgeon: Antony HasteMatthew Ramsey Eskridge, MD;  Location: [facility];  Service: Urology;  Laterality: Left;   CYSTOSCOPY WITH URETEROSCOPY AND STENT PLACEMENT Left 07/12/2013   Procedure: CYSTOSCOPY WITH LITHOLAPEXY, LEFT URETERAL STENT PLACEMENT;  Surgeon: Antony Haste, MD;  Location[facility];  Service: Urology;  Laterality: Left;   CYSTOSCOPY WITH URETEROSCOPY AND STENT PLACEMENT Bilateral 06/23/2014   Procedure: BILATERAL URETEROSCOPY, HOLMIUM LASER LITHOTRIPSY AND STENT PLACEMENT, RIGHT ;  Surgeon: Jerilee Field, MD;  Location: [facility];  Service: Urology;  Laterality: Bilateral;   EXTRACORPOREAL SHOCK WAVE LITHOTRIPSY  X2   EXTRACORPOREAL SHOCK WAVE LITHOTRIPSY Left 08-29-2013;   07-28-2013   EXTRACORPOREAL SHOCK WAVE LITHOTRIPSY Right 12/09/2018   Procedure: EXTRACORPOREAL SHOCK WAVE LITHOTRIPSY (ESWL);  Surgeon: Jerilee Field, MD;  Location: [facility];  Service: Urology;  Laterality: Right;   HOLMIUM LASER APPLICATION Left 09/20/2013   Procedure: HOLMIUM LASER APPLICATION;  Surgeon: Antony Haste, MD;  Location: [facility];  Service: Urology;  Laterality: Left;   INGUINAL HERNIA REPAIR Bilateral 10/16/2020   Procedure: BILATERAL OPEN INGUINAL HERNIA REPAIR WITH MESH;  Surgeon: Abigail Miyamoto, MD;  Location[facility];  Service: General;  Laterality: Bilateral;  LMA/TAP BLOCK   LAPAROSCOPIC INGUINAL HERNIA REPAIR Bilateral 09-04-2010   w/ mesh   TONSILLECTOMY  AS CHILD   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   TOTAL HIP ARTHROPLASTY Right 11/15/2012   Procedure: RIGHT TOTAL HIP ARTHROPLASTY ANTERIOR APPROACH;  Surgeon: Eldred Manges, MD;  Location: [facility];  Service:  Orthopedics;  Laterality: Right;  Right total hip arthroplasty   TRANSTHORACIC ECHOCARDIOGRAM  05-08-2010   NORMAL LVSF/  EF 55%/  GRADE I DIASTOLIC DYSFUNCTION/  MILD BILATERAL ATRIUM ENLARGEMENT   Social History   Occupational History   Occupation: retired  Tobacco Use   Smoking status: Light Smoker    Types: Cigars   Smokeless tobacco: Never   Tobacco comments:    AVERAGE 3 CIGARS PER WEEK  Vaping Use   Vaping Use: Never used  Substance and Sexual Activity   Alcohol use: Not Currently    Comment: occasional   Drug use: No   Sexual activity: Not on file

## 2022-03-04 ENCOUNTER — Ambulatory Visit: Payer: Medicare Other | Admitting: Orthopaedic Surgery

## 2022-03-06 ENCOUNTER — Other Ambulatory Visit: Payer: Self-pay | Admitting: Cardiology

## 2022-03-12 ENCOUNTER — Ambulatory Visit: Payer: Medicare Other | Admitting: Cardiology

## 2022-03-30 NOTE — Progress Notes (Signed)
Cardiology Office Note:    Date:  04/04/2022   ID:  [name] Short, DOB 1946/06/28, MRN [id number]  PCP:  Midge Minium, MD  Cardiologist:  Donato Heinz, MD  Electrophysiologist:  None   Referring MD: Midge Minium, MD   Chief Complaint  Patient presents with   Atrial Fibrillation     History of Present Illness:    [name] Short is a 76 y.o. male with a hx of atrial fibrillation, CVA, hypertension, hyperlipidemia who presents for follow-up.  He was initially seen on 11/27/2020.  He has a history of paroxysmal atrial fibrillation but has not been on anticoagulation.  He presented to the ED with slurred speech and left hand weakness on 10/21/20, was found to have acute CVA.  MRI brain showed acute infarct in the posterior right frontal lobe.  He had residual left-sided weakness and dysarthria.  Echocardiogram showed normal LVEF.  He was started on Eliquis 5 mg twice daily, stroke likely secondary to atrial fibrillation.  He had paroxysmal atrial fibrillation while in the hospital, heart rate 90s to 100s while in A. fib.  He was started on Cardizem.  Zio patch x14 days showed 18% A. fib burden with longest episode lasting 1 day 7 hours, average heart rate in A. fib 80 bpm.  Also showed 5 episodes of SVT, longest lasting 18 seconds.  Since last clinic visit, he reports that he has been doing okay.  Denies any chest pain, dyspnea, lightheadedness, syncope, lower extremity edema, or palpitations.  Please tennis about once per week and does yard work, denies any exertional symptoms.  He has been taking Eliquis, reports has been having occasional hematuria.  Occurs about once per month.  Has history of kidney stones and follows with urology.   Past Medical History:  Diagnosis Date   Arthritis    WRIST   Benign localized prostatic hyperplasia with lower urinary tract symptoms (LUTS)    Complication of anesthesia    slow to wake   ED (erectile dysfunction)    GERD  (gastroesophageal reflux disease)    History of concussion    AS CHILD--  NO RESIDUAL   History of gout    History of kidney stones    Hyperlipidemia    Inguinal hernia, bilateral    Nephrolithiasis    BILATERAL   PAF (paroxysmal atrial fibrillation) ([place]) currently being followed by pcp   EPISODE --  2011  FOLLOW-UP W/ DR WALL  /  HAS NOT SEEN ANY CARDIOLOGIST SINCE    Past Surgical History:  Procedure Laterality Date   APPENDECTOMY  1983   CARDIOVASCULAR STRESS TEST  05-08-2010  DR WALL   NO EVIDENCE OF SCAR OR ISCHEMIA/ EF 54%/  PT HAD BOTH AFIB/ AFLUTTER DURING STUDY   CARPAL TUNNEL RELEASE Right 01/ 14/ 2021   CYSTOSCOPY W/ URETERAL STENT PLACEMENT Left 09/20/2013   Procedure: CYSTOSCOPY WITH STENT REPLACEMENT;  Surgeon: Fredricka Bonine, MD;  Location[facility];  Service: Urology;  Laterality: Left;   CYSTOSCOPY WITH URETEROSCOPY Left 09/20/2013   Procedure: CYSTOSCOPY WITH URETEROSCOPY;  Surgeon: Fredricka Bonine, MD;  Location: [facility];  Service: Urology;  Laterality: Left;   CYSTOSCOPY WITH URETEROSCOPY AND STENT PLACEMENT Left 07/12/2013   Procedure: CYSTOSCOPY WITH LITHOLAPEXY, LEFT URETERAL STENT PLACEMENT;  Surgeon: Fredricka Bonine, MD;  Location: [facility];  Service: Urology;  Laterality: Left;   CYSTOSCOPY WITH URETEROSCOPY AND STENT PLACEMENT Bilateral 06/23/2014  Procedure: BILATERAL URETEROSCOPY, HOLMIUM LASER LITHOTRIPSY AND STENT PLACEMENT, RIGHT ;  Surgeon: Jerilee Field, MD;  Location: [facility];  Service: Urology;  Laterality: Bilateral;   EXTRACORPOREAL SHOCK WAVE LITHOTRIPSY  X2   EXTRACORPOREAL SHOCK WAVE LITHOTRIPSY Left 08-29-2013;   07-28-2013   EXTRACORPOREAL SHOCK WAVE LITHOTRIPSY Right 12/09/2018   Procedure: EXTRACORPOREAL SHOCK WAVE LITHOTRIPSY (ESWL);  Surgeon: Jerilee Field, MD;  Location: [facility];  Service: Urology;  Laterality: Right;   HOLMIUM LASER  APPLICATION Left 09/20/2013   Procedure: HOLMIUM LASER APPLICATION;  Surgeon: Antony Haste, MD;  Location: [facility];  Service: Urology;  Laterality: Left;   INGUINAL HERNIA REPAIR Bilateral 10/16/2020   Procedure: BILATERAL OPEN INGUINAL HERNIA REPAIR WITH MESH;  Surgeon: Abigail Miyamoto, MD;  Location: [facility];  Service: General;  Laterality: Bilateral;  LMA/TAP BLOCK   LAPAROSCOPIC INGUINAL HERNIA REPAIR Bilateral 09-04-2010   w/ mesh   TONSILLECTOMY  AS CHILD   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   TOTAL HIP ARTHROPLASTY Right 11/15/2012   Procedure: RIGHT TOTAL HIP ARTHROPLASTY ANTERIOR APPROACH;  Surgeon: Eldred Manges, MD;  Location: [facility];  Service: Orthopedics;  Laterality: Right;  Right total hip arthroplasty   TRANSTHORACIC ECHOCARDIOGRAM  05-08-2010   NORMAL LVSF/  EF 55%/  GRADE I DIASTOLIC DYSFUNCTION/  MILD BILATERAL ATRIUM ENLARGEMENT    Current Medications: Current Meds  Medication Sig   allopurinol (ZYLOPRIM) 100 MG tablet Take 1 tablet (100 mg total) by mouth daily.   apixaban (ELIQUIS) 5 MG TABS tablet Take 1 tablet (5 mg total) by mouth 2 (two) times daily.   Cimetidine (TAGAMET PO) Take 1 tablet by mouth daily as needed (for reflux).   Colchicine 0.6 MG CAPS Take 0.6 mg by mouth daily as needed (for gout flares).   metoprolol succinate (TOPROL-XL) 25 MG 24 hr tablet TAKE ONE TABLET BY MOUTH DAILY **STOP DILTIAZEM**   Multiple Vitamin (MULTIVITAMIN WITH MINERALS) TABS Take 1 tablet by mouth daily.   Polyethylene Glycol 400 (BLINK TEARS) 0.25 % SOLN Place 1 drop into both eyes 2 (two) times daily as needed (for dryness or irritation).   rosuvastatin (CRESTOR) 20 MG tablet TAKE ONE TABLET BY MOUTH DAILY   tadalafil (CIALIS) 5 MG tablet Take 5 mg by mouth daily as needed for erectile dysfunction.     Allergies:   Shellfish-derived products and Warfarin sodium   Social History   Socioeconomic History   Marital status:  Married    Spouse name: Not on file   Number of children: Not on file   Years of education: Not on file   Highest education level: Not on file  Occupational History   Occupation: retired  Tobacco Use   Smoking status: Light Smoker    Types: Cigars   Smokeless tobacco: Never   Tobacco comments:    AVERAGE 3 CIGARS PER WEEK  Vaping Use   Vaping Use: Never used  Substance and Sexual Activity   Alcohol use: Not Currently    Comment: occasional   Drug use: No   Sexual activity: Not on file  Other Topics Concern   Not on file  Social History Narrative   Not on file   Social Determinants of Health   Financial Resource Strain: Low Risk  (08/13/2020)   Overall Financial Resource Strain (CARDIA)    Difficulty of Paying Living Expenses: Not hard at all  Food Insecurity: No Food Insecurity (08/13/2020)   Hunger Vital Sign    Worried  About Running Out of Food in the Last Year: Never true    Ran Out of Food in the Last Year: Never true  Transportation Needs: No Transportation Needs (08/13/2020)   PRAPARE - Administrator, Civil Service (Medical): No    Lack of Transportation (Non-Medical): No  Physical Activity: Sufficiently Active (08/13/2020)   Exercise Vital Sign    Days of Exercise per Week: 6 days    Minutes of Exercise per Session: 60 min  Stress: No Stress Concern Present (08/13/2020)   [employer] of Occupational Health - Occupational Stress Questionnaire    Feeling of Stress : Not at all  Social Connections: Moderately Isolated (08/13/2020)   Social Connection and Isolation Panel [NHANES]    Frequency of Communication with Friends and Family: More than three times a week    Frequency of Social Gatherings with Friends and Family: Once a week    Attends Religious Services: Never    Database administrator or Organizations: No    Attends Engineer, structural: Never    Marital Status: Married     Family History: The patient's family history  includes Arthritis in his mother; Bipolar disorder in his brother; Dementia in his brother and mother; Diabetes in his brother, maternal grandfather, maternal grandmother, mother, and sister; Heart disease in his father; Neuropathy in his brother; Prostate cancer in his father; Rheum arthritis in his mother.  ROS:   Please see the history of present illness.     All other systems reviewed and are negative.  EKGs/Labs/Other Studies Reviewed:    The following studies were reviewed today:  14-Day Monitor 01/03/2021: 18% A. fib burden, longest episode lasting 1 day 7 hours. Average heart rate in A. fib 80 bpm 5 episodes of SVT, longest lasting 18 seconds with average rate 94 bpm   Patch Wear Time:  14 days and 0 hours (2022-03-17T12:53:47-0400 to 2022-03-31T12:53:51-0400)   Patient had a min HR of 41 bpm, max HR of 144 bpm, and avg HR of 61 bpm. Predominant underlying rhythm was Sinus Rhythm. 5 Supraventricular Tachycardia runs occurred, the run with the fastest interval lasting 14 beats with a max rate of 133 bpm, the  longest lasting 17.9 secs with an avg rate of 94 bpm. Atrial Fibrillation occurred (18% burden), ranging from 49-144 bpm (avg of 80 bpm), the longest lasting 1 day 7 hours with an avg rate of 83 bpm. Isolated SVEs were rare (<1.0%), SVE Couplets were  rare (<1.0%), and SVE Triplets were rare (<1.0%). Isolated VEs were rare (<1.0%), VE Couplets were rare (<1.0%), and no VE Triplets were present.  No patient triggered events.  Echo 10/23/2020: 1. Pt in atrial fibrillation at time of study.   2. Left ventricular ejection fraction, by estimation, is 60 to 65%. The  left ventricle has normal function. The left ventricle has no regional  wall motion abnormalities. There is mild left ventricular hypertrophy.  Left ventricular diastolic function  could not be evaluated.   3. Right ventricular systolic function is normal. The right ventricular  size is normal. Tricuspid regurgitation  signal is inadequate for assessing  PA pressure.   4. The mitral valve is normal in structure. Trivial mitral valve  regurgitation. No evidence of mitral stenosis.   5. The aortic valve is tricuspid. Aortic valve regurgitation is trivial.  No aortic stenosis is present.   6. The inferior vena cava is normal in size with greater than 50%  respiratory variability, suggesting right atrial  pressure of 3 mmHg.   EKG:    04/04/2022: Normal sinus rhythm, rate 65, no ST abnormality 08/2021: sinus rhythm, rate 54,no ST abnormalities 02/27/2021: normal sinus rhythm, rate 52, Nonspecific T wave flattening 11/27/2020: normal sinus rhythm, rate 65, no ST abnormalities  Recent Labs: 02/12/2022: ALT 14; BUN 11; Creatinine, Ser 1.04; Hemoglobin 13.3; Platelets 199.0; Potassium 4.1; Sodium 141; TSH 3.80  Recent Lipid Panel    Component Value Date/Time   CHOL 119 02/12/2022 1011   TRIG 187.0 (H) 02/12/2022 1011   HDL 39.60 02/12/2022 1011   CHOLHDL 3 02/12/2022 1011   VLDL 37.4 02/12/2022 1011   LDLCALC 42 02/12/2022 1011   LDLDIRECT 128.1 05/17/2012 0901    Physical Exam:    VS:  BP 126/78   Pulse 65   Ht 5\' 7"  (1.702 m)   Wt 171 lb 9.6 oz (77.8 kg)   SpO2 99%   BMI 26.88 kg/m     Wt Readings from Last 3 Encounters:  04/04/22 171 lb 9.6 oz (77.8 kg)  02/18/22 175 lb (79.4 kg)  02/12/22 177 lb 6.4 oz (80.5 kg)     GEN:  in no acute distress HEENT: Normal NECK: No JVD; No carotid bruits CARDIAC: RRR, no murmurs, rubs, gallops RESPIRATORY:  Clear to auscultation without rales, wheezing or rhonchi  ABDOMEN: Soft, non-tender, non-distended MUSCULOSKELETAL:  No edema; No deformity  SKIN: Warm and dry NEUROLOGIC:  Alert and oriented x 3 PSYCHIATRIC:  Normal affect   ASSESSMENT:    1. Paroxysmal atrial fibrillation (HCC)   2. Cerebrovascular accident (CVA), unspecified mechanism ([place])   3. Essential hypertension   4. Hyperlipidemia, unspecified hyperlipidemia type   5. Hematuria,  unspecified type      PLAN:    Paroxysmal atrial fibrillation: CHA2DS2-VASc score 4 (age, hypertension, CVA x2).  Echocardiogram 10/23/2020 showed normal biventricular function, no significant valvular disease.  Zio patch x14 days showed 18% A. fib burden with longest episode lasting 1 day 7 hours, average heart rate in A. fib 80 bpm.  Also showed 5 episodes of SVT, longest lasting 18 seconds. -Continue Eliquis 5 mg bid.  He reports has been having issues with hematuria, occurs about once per month.  He has a history of kidney stones and follows with urology.  Given this will likely be an ongoing issue, recommend referral to Dr. Quentin Ore to evaluate for Watchman procedure -Continue Toprol-XL 25 mg daily -Sleep study shows mild OSA, no indication for CPAP at this time  CVA: Presented 10/21/2020 with left-sided weakness, found to have acute CVA.  Likely due to A. fib as above.  Continue Eliquis, statin  Hyperlipidemia: on rosuvastatin 20 mg daily, LDL 42 on 02/12/22  Hypertension: Continue Toprol-XL 25 mg daily.  Appears controlled  Snoring: Sleep study shows mild OSA, no indication for CPAP at this time  Hematuria: Likely due to kidney stones.  Follows with urology.  Check CBC.  Given ongoing need for anticoagulation with atrial fibrillation and history of CVA, recommend referral to Dr. Quentin Ore for Watchman eval as above  RTC in 6 months  Medication Adjustments/Labs and Tests Ordered: Current medicines are reviewed at length with the patient today.  Concerns regarding medicines are outlined above.  Orders Placed This Encounter  Procedures   CBC   Ambulatory referral to Cardiac Electrophysiology   EKG 12-Lead    No orders of the defined types were placed in this encounter.    Patient Instructions  Medication Instructions:  No Changes In Medications at this  time.  *If you need a refill on your cardiac medications before your next appointment, please call your pharmacy*  Lab  Work: CBC TODAY  If you have labs (blood work) drawn today and your tests are completely normal, you will receive your results only by: [place] (if you have MyChart) OR A paper copy in the mail If you have any lab test that is abnormal or we need to change your treatment, we will call you to review the results.  Testing/Procedures: None Ordered At This Time.   Follow-Up: At [facility], you and your health needs are our priority.  As part of our continuing mission to provide you with exceptional heart care, we have created designated Provider Care Teams.  These Care Teams include your primary Cardiologist (physician) and Advanced Practice Providers (APPs -  Physician Assistants and Nurse Practitioners) who all work together to provide you with the care you need, when you need it.  Your next appointment:   AS SCHEDULED   The format for your next appointment:   In Person  Provider:   Donato Heinz, MD    Other Instructions REFERRAL TO [place] THIS             Signed, Donato Heinz, MD  04/04/2022 9:28 AM    [place]

## 2022-04-04 ENCOUNTER — Ambulatory Visit (INDEPENDENT_AMBULATORY_CARE_PROVIDER_SITE_OTHER): Payer: Medicare Other | Admitting: Cardiology

## 2022-04-04 ENCOUNTER — Encounter: Payer: Self-pay | Admitting: Cardiology

## 2022-04-04 VITALS — BP 126/78 | HR 65 | Ht 67.0 in | Wt 171.6 lb

## 2022-04-04 DIAGNOSIS — E785 Hyperlipidemia, unspecified: Secondary | ICD-10-CM | POA: Diagnosis not present

## 2022-04-04 DIAGNOSIS — I480 Paroxysmal atrial fibrillation: Secondary | ICD-10-CM

## 2022-04-04 DIAGNOSIS — R319 Hematuria, unspecified: Secondary | ICD-10-CM | POA: Diagnosis not present

## 2022-04-04 DIAGNOSIS — I639 Cerebral infarction, unspecified: Secondary | ICD-10-CM | POA: Diagnosis not present

## 2022-04-04 DIAGNOSIS — I10 Essential (primary) hypertension: Secondary | ICD-10-CM

## 2022-04-04 NOTE — Patient Instructions (Signed)
Medication Instructions:  No Changes In Medications at this time.  *If you need a refill on your cardiac medications before your next appointment, please call your pharmacy*  Lab Work: CBC TODAY  If you have labs (blood work) drawn today and your tests are completely normal, you will receive your results only by: MyChart Message (if you have MyChart) OR A paper copy in the mail If you have any lab test that is abnormal or we need to change your treatment, we will call you to review the results.  Testing/Procedures: None Ordered At This Time.   Follow-Up: At [facility], you and your health needs are our priority.  As part of our continuing mission to provide you with exceptional heart care, we have created designated Provider Care Teams.  These Care Teams include your primary Cardiologist (physician) and Advanced Practice Providers (APPs -  Physician Assistants and Nurse Practitioners) who all work together to provide you with the care you need, when you need it.  Your next appointment:   AS SCHEDULED   The format for your next appointment:   In Person  Provider:   Little Ishikawa, MD    Other Instructions REFERRAL TO ELECTROPHYSIOLOGY- SOMEONE WILL REACH OUT TO YOU AND GET YOU SCHEDULED FOR THIS

## 2022-04-05 LAB — CBC
Hematocrit: 43.2 % (ref 37.5–51.0)
Hemoglobin: 14 g/dL (ref 13.0–17.7)
MCH: 29.4 pg (ref 26.6–33.0)
MCHC: 32.4 g/dL (ref 31.5–35.7)
MCV: 91 fL (ref 79–97)
Platelets: 216 10*3/uL (ref 150–450)
RBC: 4.77 x10E6/uL (ref 4.14–5.80)
RDW: 12.8 % (ref 11.6–15.4)
WBC: 7.5 10*3/uL (ref 3.4–10.8)

## 2022-04-08 ENCOUNTER — Ambulatory Visit (INDEPENDENT_AMBULATORY_CARE_PROVIDER_SITE_OTHER): Payer: Medicare Other | Admitting: Orthopaedic Surgery

## 2022-04-08 ENCOUNTER — Encounter: Payer: Self-pay | Admitting: Orthopaedic Surgery

## 2022-04-08 ENCOUNTER — Ambulatory Visit (INDEPENDENT_AMBULATORY_CARE_PROVIDER_SITE_OTHER): Payer: Medicare Other

## 2022-04-08 VITALS — BP 125/75 | HR 83 | Ht 67.0 in | Wt 171.0 lb

## 2022-04-08 DIAGNOSIS — M25562 Pain in left knee: Secondary | ICD-10-CM

## 2022-04-08 DIAGNOSIS — M5459 Other low back pain: Secondary | ICD-10-CM

## 2022-04-08 DIAGNOSIS — G8929 Other chronic pain: Secondary | ICD-10-CM

## 2022-04-08 DIAGNOSIS — I639 Cerebral infarction, unspecified: Secondary | ICD-10-CM

## 2022-04-08 DIAGNOSIS — M545 Low back pain, unspecified: Secondary | ICD-10-CM

## 2022-04-08 MED ORDER — PREDNISONE 10 MG (21) PO TBPK: ORAL_TABLET | ORAL | 0 refills | Status: DC

## 2022-04-08 NOTE — Progress Notes (Signed)
Office Visit Note   Patient: [name] Short           Date of Birth: [birth year]           MRN: [id number] Visit Date: 04/08/2022              Requested by: Sheliah Hatch, MD [address] [place],  [place] [address] PCP: Sheliah Hatch, MD   Assessment & Plan: Visit Diagnoses:  1. Chronic bilateral low back pain, unspecified whether sciatica present   2. Acute pain of left knee     Plan: Pain left lower extremity with positive nerve root tension signs suggesting this is related to the lumbar spine.  Prednisone Dosepak sent in.  If he has persistent symptoms we will need to consider lumbar imaging studies.  Follow-Up Instructions: No follow-ups on file.   Orders:  Orders Placed This Encounter  Procedures   XR Lumbar Spine 2-3 Views   XR Knee 1-2 Views Left   No orders of the defined types were placed in this encounter.     Procedures: No procedures performed   Clinical Data: No additional findings.   Subjective: Chief Complaint  Patient presents with   Lower Back - Pain   Left Knee - Pain    HPI 76 year old male seen with leg pain left leg medial side of his knee no known injury.  States he woke up Saturday with back pain leg pain.  Not able to perform a straight leg raise due to radiating pain down his leg.  He has had pain when he attempts to weight-bear.  He has had some chronic tingling in his left foot unknown if it is related to his old stroke.  His previous CVA was right frontal lobe.  History of atrial fibs also gout.  Bilateral total hip arthroplasties doing well.  Patient is on chronic  Review of Systems all other systems noncontributory to HPI.   Objective: Vital Signs: BP 125/75   Pulse 83   Ht 5\' 7"  (1.702 m)   Wt 171 lb (77.6 kg)   BMI 26.78 kg/m   Physical Exam Constitutional:      Appearance: He is well-developed.  HENT:     Head: Normocephalic and atraumatic.     Right Ear: External ear normal.     Left Ear:  External ear normal.  Eyes:     Pupils: Pupils are equal, round, and reactive to light.  Neck:     Thyroid: No thyromegaly.     Trachea: No tracheal deviation.  Cardiovascular:     Rate and Rhythm: Normal rate.  Pulmonary:     Effort: Pulmonary effort is normal.     Breath sounds: No wheezing.  Abdominal:     General: Bowel sounds are normal.     Palpations: Abdomen is soft.  Musculoskeletal:     Cervical back: Neck supple.  Skin:    General: Skin is warm and dry.     Capillary Refill: Capillary refill takes less than 2 seconds.  Neurological:     Mental Status: He is alert and oriented to person, place, and time.  Psychiatric:        Behavior: Behavior normal.        Thought Content: Thought content normal.        Judgment: Judgment normal.     Ortho Exam well-healed right and left direct anterior total hip arthroplasty incisions.  He has pain with straight leg raising positive  popliteal compression positive sciatic notch tenderness on the left some pain with hip range of motion.  Only mild knee tenderness no effusion left knee.  Specialty Comments:  No specialty comments available.  Imaging: No results found.   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/14/2014   Physical exam, annual 05/17/2012   Inguinal hernia 08/12/2010   HYPERLIPIDEMIA TYPE I / IV 05/01/2010   TOBACCO USER 05/01/2010    Paroxysmal a-fib (HCC) 05/01/2010   ABNORMAL ELECTROCARDIOGRAM 03/26/2010   Past Medical History:  Diagnosis Date   Arthritis    WRIST   Benign localized prostatic hyperplasia with lower urinary tract symptoms (LUTS)    Complication of anesthesia    slow to wake   ED (erectile dysfunction)    GERD (gastroesophageal reflux disease)    History of concussion    AS CHILD--  NO RESIDUAL   History of gout    History of kidney stones    Hyperlipidemia    Inguinal hernia, bilateral    Nephrolithiasis    BILATERAL   PAF (paroxysmal atrial fibrillation) (HCC) currently being followed by pcp   EPISODE --  2011  FOLLOW-UP W/ DR WALL  /  HAS NOT SEEN ANY CARDIOLOGIST SINCE    Family History  Problem Relation Age of Onset   Diabetes Mother    Arthritis Mother    Rheum arthritis Mother    Dementia Mother    Heart disease Father    Prostate cancer Father    Diabetes Sister    Diabetes Maternal Grandmother    Diabetes Maternal Grandfather    Bipolar disorder Brother    Diabetes Brother    Dementia Brother    Neuropathy Brother     Past Surgical History:  Procedure Laterality Date   APPENDECTOMY  1983   CARDIOVASCULAR STRESS TEST  05-08-2010  DR WALL   NO EVIDENCE OF SCAR OR ISCHEMIA/ EF 54%/  PT HAD BOTH AFIB/ AFLUTTER DURING STUDY   CARPAL TUNNEL RELEASE Right 01/ 14/ 2021   CYSTOSCOPY W/ URETERAL STENT PLACEMENT Left 09/20/2013   Procedure: CYSTOSCOPY WITH STENT REPLACEMENT;  Surgeon: Antony Haste, MD;  Location: [facility];  Service: Urology;  Laterality: Left;   CYSTOSCOPY WITH URETEROSCOPY Left 09/20/2013   Procedure: CYSTOSCOPY WITH URETEROSCOPY;  Surgeon: Antony Haste, MD;  Location: [facility];  Service: Urology;  Laterality: Left;   CYSTOSCOPY WITH URETEROSCOPY AND STENT PLACEMENT Left 07/12/2013   Procedure: CYSTOSCOPY WITH LITHOLAPEXY, LEFT URETERAL STENT PLACEMENT;  Surgeon: Antony Haste, MD;  Location[facility];  Service: Urology;  Laterality: Left;   CYSTOSCOPY WITH URETEROSCOPY AND STENT PLACEMENT Bilateral 06/23/2014   Procedure: BILATERAL URETEROSCOPY, HOLMIUM LASER LITHOTRIPSY AND STENT PLACEMENT, RIGHT ;  Surgeon: Jerilee Field, MD;  Location: [facility];  Service: Urology;  Laterality: Bilateral;   EXTRACORPOREAL SHOCK WAVE LITHOTRIPSY  X2   EXTRACORPOREAL SHOCK WAVE LITHOTRIPSY Left 08-29-2013;  07-28-2013   EXTRACORPOREAL SHOCK WAVE LITHOTRIPSY Right 12/09/2018   Procedure: EXTRACORPOREAL SHOCK WAVE LITHOTRIPSY (ESWL);  Surgeon: Jerilee Field, MD;  Location: [facility];  Service: Urology;  Laterality: Right;   HOLMIUM LASER APPLICATION Left 09/20/2013   Procedure: HOLMIUM LASER APPLICATION;  Surgeon: Antony Haste, MD;  Location[facility];  Service: Urology;  Laterality: Left;   INGUINAL HERNIA REPAIR Bilateral 10/16/2020   Procedure: BILATERAL OPEN INGUINAL HERNIA REPAIR WITH MESH;  Surgeon: Abigail Miyamoto, MD;  Location[facility];  Service: General;  Laterality: Bilateral;  LMA/TAP BLOCK   LAPAROSCOPIC INGUINAL HERNIA REPAIR Bilateral 09-04-2010   w/ mesh   TONSILLECTOMY  AS CHILD   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   TOTAL HIP ARTHROPLASTY Right 11/15/2012   Procedure: RIGHT TOTAL HIP ARTHROPLASTY ANTERIOR APPROACH;  Surgeon: Eldred Manges, MD;  Location[facility];  Service: Orthopedics;  Laterality: Right;  Right total hip arthroplasty   TRANSTHORACIC ECHOCARDIOGRAM  05-08-2010   NORMAL LVSF/  EF 55%/  GRADE I DIASTOLIC DYSFUNCTION/  MILD BILATERAL ATRIUM ENLARGEMENT   Social History   Occupational History   Occupation: retired  Tobacco Use   Smoking status: Light Smoker    Types: Cigars   Smokeless tobacco: Never   Tobacco comments:    AVERAGE 3 CIGARS PER WEEK  Vaping Use   Vaping Use: Never used  Substance and Sexual Activity   Alcohol use: Not Currently    Comment: occasional    Drug use: No   Sexual activity: Not on file

## 2022-04-09 ENCOUNTER — Telehealth: Payer: Self-pay

## 2022-04-09 NOTE — Telephone Encounter (Signed)
Per Dr. Bjorn Pippin, called patient to arrange Watchman consult with Dr. Lalla Brothers.  Left message to call back.

## 2022-04-10 ENCOUNTER — Encounter: Payer: Self-pay | Admitting: Orthopaedic Surgery

## 2022-04-10 NOTE — Telephone Encounter (Signed)
Scheduled the patient for Watchman consult with Dr. Lalla Brothers on 04/16/2022. He and his wife were grateful for call and agree with plan.

## 2022-04-15 NOTE — Progress Notes (Unsigned)
Electrophysiology Office Note:    Date:  04/16/2022   ID:  [name] Short, DOB [birth year], MRN [id number]  PCP:  Sheliah Hatch, MD  [facility] Cardiologist:  Little Ishikawa, MD  [facility] Electrophysiologist:  Lanier Prude, MD   Referring MD: Sheliah Hatch, MD   Chief Complaint: Atrial fibrillation  History of Present Illness:    [name] Short is a 76 y.o. male who presents for an evaluation of atrial fibrillation at the request of Dr. Beverely Low. Their medical history includes atrial fibrillation, stroke, hypertension, hyperlipidemia.  The patient is referred by Dr. Bjorn Pippin who last saw the patient April 04, 2022.  He had an acute stroke in January 2022.  He had a prior diagnosis of A-fib but was not maintained on anticoagulation.  He was thus started on Eliquis 5 mg twice daily for secondary prophylaxis.  At the appointment with Dr. Bjorn Pippin, the patient reported doing fairly well.  He played tennis once per week and does yard work.  He has been struggling with intermittent hematuria.  He does follow with urology.  Previous Zio patch showed an 18% A-fib burden.     Past Medical History:  Diagnosis Date   Arthritis    WRIST   Benign localized prostatic hyperplasia with lower urinary tract symptoms (LUTS)    Complication of anesthesia    slow to wake   ED (erectile dysfunction)    GERD (gastroesophageal reflux disease)    History of concussion    AS CHILD--  NO RESIDUAL   History of gout    History of kidney stones    Hyperlipidemia    Inguinal hernia, bilateral    Nephrolithiasis    BILATERAL   PAF (paroxysmal atrial fibrillation) (HCC) currently being followed by pcp   EPISODE --  2011  FOLLOW-UP W/ DR WALL  /  HAS NOT SEEN ANY CARDIOLOGIST SINCE    Past Surgical History:  Procedure Laterality Date   APPENDECTOMY  1983   CARDIOVASCULAR STRESS TEST  05-08-2010  DR WALL   NO EVIDENCE OF SCAR OR ISCHEMIA/ EF 54%/  PT HAD BOTH AFIB/  AFLUTTER DURING STUDY   CARPAL TUNNEL RELEASE Right 01/ 14/ 2021   CYSTOSCOPY W/ URETERAL STENT PLACEMENT Left 09/20/2013   Procedure: CYSTOSCOPY WITH STENT REPLACEMENT;  Surgeon: Antony Haste, MD;  Location: [facility];  Service: Urology;  Laterality: Left;   CYSTOSCOPY WITH URETEROSCOPY Left 09/20/2013   Procedure: CYSTOSCOPY WITH URETEROSCOPY;  Surgeon: Antony Haste, MD;  Location: [facility];  Service: Urology;  Laterality: Left;   CYSTOSCOPY WITH URETEROSCOPY AND STENT PLACEMENT Left 07/12/2013   Procedure: CYSTOSCOPY WITH LITHOLAPEXY, LEFT URETERAL STENT PLACEMENT;  Surgeon: Antony Haste, MD;  Location: [facility];  Service: Urology;  Laterality: Left;   CYSTOSCOPY WITH URETEROSCOPY AND STENT PLACEMENT Bilateral 06/23/2014   Procedure: BILATERAL URETEROSCOPY, HOLMIUM LASER LITHOTRIPSY AND STENT PLACEMENT, RIGHT ;  Surgeon: Jerilee Field, MD;  Location: [facility];  Service: Urology;  Laterality: Bilateral;   EXTRACORPOREAL SHOCK WAVE LITHOTRIPSY  X2   EXTRACORPOREAL SHOCK WAVE LITHOTRIPSY Left 08-29-2013;   07-28-2013   EXTRACORPOREAL SHOCK WAVE LITHOTRIPSY Right 12/09/2018   Procedure: EXTRACORPOREAL SHOCK WAVE LITHOTRIPSY (ESWL);  Surgeon: Jerilee Field, MD;  Location: [facility];  Service: Urology;  Laterality: Right;   HOLMIUM LASER APPLICATION Left 09/20/2013   Procedure: HOLMIUM LASER APPLICATION;  Surgeon: Antony Haste, MD;  Location: [facility];  Service:  Urology;  Laterality: Left;   INGUINAL HERNIA REPAIR Bilateral 10/16/2020   Procedure: BILATERAL OPEN INGUINAL HERNIA REPAIR WITH MESH;  Surgeon: Abigail Miyamoto, MD;  Location: [facility];  Service: General;  Laterality: Bilateral;  LMA/TAP BLOCK   LAPAROSCOPIC INGUINAL HERNIA REPAIR Bilateral 09-04-2010   w/ mesh   TONSILLECTOMY  AS CHILD   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   TOTAL  HIP ARTHROPLASTY Right 11/15/2012   Procedure: RIGHT TOTAL HIP ARTHROPLASTY ANTERIOR APPROACH;  Surgeon: Eldred Manges, MD;  Location: [facility];  Service: Orthopedics;  Laterality: Right;  Right total hip arthroplasty   TRANSTHORACIC ECHOCARDIOGRAM  05-08-2010   NORMAL LVSF/  EF 55%/  GRADE I DIASTOLIC DYSFUNCTION/  MILD BILATERAL ATRIUM ENLARGEMENT    Current Medications: Current Meds  Medication Sig   allopurinol (ZYLOPRIM) 100 MG tablet Take 1 tablet (100 mg total) by mouth daily.   apixaban (ELIQUIS) 5 MG TABS tablet Take 1 tablet (5 mg total) by mouth 2 (two) times daily.   Cimetidine (TAGAMET PO) Take 1 tablet by mouth daily as needed (for reflux).   Colchicine 0.6 MG CAPS Take 0.6 mg by mouth daily as needed (for gout flares).   metoprolol succinate (TOPROL-XL) 25 MG 24 hr tablet TAKE ONE TABLET BY MOUTH DAILY **STOP DILTIAZEM**   Multiple Vitamin (MULTIVITAMIN WITH MINERALS) TABS Take 1 tablet by mouth daily.   Polyethylene Glycol 400 (BLINK TEARS) 0.25 % SOLN Place 1 drop into both eyes 2 (two) times daily as needed (for dryness or irritation).   rosuvastatin (CRESTOR) 20 MG tablet TAKE ONE TABLET BY MOUTH DAILY   tadalafil (CIALIS) 5 MG tablet Take 5 mg by mouth daily as needed for erectile dysfunction.     Allergies:   Shellfish-derived products and Warfarin sodium   Social History   Socioeconomic History   Marital status: Married    Spouse name: Not on file   Number of children: Not on file   Years of education: Not on file   Highest education level: Not on file  Occupational History   Occupation: retired  Tobacco Use   Smoking status: Light Smoker    Types: Cigars   Smokeless tobacco: Never   Tobacco comments:    AVERAGE 3 CIGARS PER WEEK  Vaping Use   Vaping Use: Never used  Substance and Sexual Activity   Alcohol use: Not Currently    Comment: occasional   Drug use: No   Sexual activity: Not on file  Other Topics Concern   Not on file  Social History Narrative    Not on file   Social Determinants of Health   Financial Resource Strain: Low Risk  (08/13/2020)   Overall Financial Resource Strain (CARDIA)    Difficulty of Paying Living Expenses: Not hard at all  Food Insecurity: No Food Insecurity (08/13/2020)   Hunger Vital Sign    Worried About Running Out of Food in the Last Year: Never true    Ran Out of Food in the Last Year: Never true  Transportation Needs: No Transportation Needs (08/13/2020)   PRAPARE - Administrator, Civil Service (Medical): No    Lack of Transportation (Non-Medical): No  Physical Activity: Sufficiently Active (08/13/2020)   Exercise Vital Sign    Days of Exercise per Week: 6 days    Minutes of Exercise per Session: 60 min  Stress: No Stress Concern Present (08/13/2020)   [employer] of Occupational Health - Occupational Stress Questionnaire    Feeling  of Stress : Not at all  Social Connections: Moderately Isolated (08/13/2020)   Social Connection and Isolation Panel [NHANES]    Frequency of Communication with Friends and Family: More than three times a week    Frequency of Social Gatherings with Friends and Family: Once a week    Attends Religious Services: Never    Database administrator or Organizations: No    Attends Engineer, structural: Never    Marital Status: Married     Family History: The patient's family history includes Arthritis in his mother; Bipolar disorder in his brother; Dementia in his brother and mother; Diabetes in his brother, maternal grandfather, maternal grandmother, mother, and sister; Heart disease in his father; Neuropathy in his brother; Prostate cancer in his father; Rheum arthritis in his mother.  ROS:   Please see the history of present illness.    All other systems reviewed and are negative.  EKGs/Labs/Other Studies Reviewed:    The following studies were reviewed today:  Jan 27, 2021 ZIO personally reviewed A-fib burden 18% with average  ventricular rate of 80 bpm   October 23, 2020 echo Ejection fraction 60 to 65% RV normal Trivial MR Trivial AI  Recent Labs: 02/12/2022: ALT 14; BUN 11; Creatinine, Ser 1.04; Potassium 4.1; Sodium 141; TSH 3.80 04/04/2022: Hemoglobin 14.0; Platelets 216  Recent Lipid Panel    Component Value Date/Time   CHOL 119 02/12/2022 1011   TRIG 187.0 (H) 02/12/2022 1011   HDL 39.60 02/12/2022 1011   CHOLHDL 3 02/12/2022 1011   VLDL 37.4 02/12/2022 1011   LDLCALC 42 02/12/2022 1011   LDLDIRECT 128.1 05/17/2012 0901    Physical Exam:    VS:  BP 122/64   Pulse 61   Ht 5\' 7"  (1.702 m)   Wt 174 lb (78.9 kg)   SpO2 98%   BMI 27.25 kg/m     Wt Readings from Last 3 Encounters:  04/16/22 174 lb (78.9 kg)  04/08/22 171 lb (77.6 kg)  04/04/22 171 lb 9.6 oz (77.8 kg)     GEN:  Well nourished, well developed in no acute distress HEENT: Normal NECK: No JVD; No carotid bruits LYMPHATICS: No lymphadenopathy CARDIAC: RRR, no murmurs, rubs, gallops RESPIRATORY:  Clear to auscultation without rales, wheezing or rhonchi  ABDOMEN: Soft, non-tender, non-distended MUSCULOSKELETAL:  No edema; No deformity  SKIN: Warm and dry NEUROLOGIC:  Alert and oriented x 3 PSYCHIATRIC:  Normal affect       ASSESSMENT:    1. Paroxysmal A-fib (HCC)   2. Cerebrovascular accident (CVA) due to embolism of right middle cerebral artery (HCC)   3. Hematuria, unspecified type    PLAN:    In order of problems listed above:  #Persistent atrial fibrillation Minimally symptomatic.  I discussed treatment options for the atrial fibrillation and stroke risk mitigation strategies during today's visit.  The patient's biggest concern at this time is his stroke risk and exposure anticoagulation given his history of hematuria which is very understandable.  We will plan for left atrial appendage occlusion first.  If his atrial fibrillation becomes more problematic in the future, could always circle back to consider  ablation versus antiarrhythmic drugs.  I discussed the left atrial appendage occlusion procedure in detail with patient include the risks, recovery and likelihood of success and he wishes to proceed.  -----------------------  I have seen 06[birth year] Markell in the office today who is being considered for a Watchman left atrial appendage closure device. I believe they  will benefit from this procedure given their history of atrial fibrillation, CHA2DS2-VASc score of 5 and unadjusted ischemic stroke rate of 7.2% per year. Unfortunately, the patient is not felt to be a long term anticoagulation candidate secondary to recurrent hematuria. The patient's chart has been reviewed and I feel that they would be a candidate for short term oral anticoagulation after Watchman implant.   It is my belief that after undergoing a LAA closure procedure, Kawhi I Kelso will not need long term anticoagulation which eliminates anticoagulation side effects and major bleeding risk.   Procedural risks for the Watchman implant have been reviewed with the patient including a 0.5% risk of stroke, <1% risk of perforation and <1% risk of device embolization. Other risks include bleeding, vascular damage, tamponade, worsening renal function, and death. The patient understands these risk and wishes to proceed.     The published clinical data on the safety and effectiveness of WATCHMAN include but are not limited to the following: - Holmes DR, Everlene Farrier, Sick P et al. for the PROTECT AF Investigators. Percutaneous closure of the left atrial appendage versus warfarin therapy for prevention of stroke in patients with atrial fibrillation: a randomised non-inferiority trial. Lancet 2009; 374: 534-42. Everlene Farrier, Doshi SK, Isa Rankin D et al. on behalf of the PROTECT AF Investigators. Percutaneous Left Atrial Appendage Closure for Stroke Prophylaxis in Patients With Atrial Fibrillation 2.3-Year Follow-up of the PROTECT AF  (Watchman Left Atrial Appendage System for Embolic Protection in Patients With Atrial Fibrillation) Trial. Circulation 2013; 127:720-729. - Alli O, Doshi S,  Kar S, Reddy VY, Sievert H et al. Quality of Life Assessment in the Randomized PROTECT AF (Percutaneous Closure of the Left Atrial Appendage Versus Warfarin Therapy for Prevention of Stroke in Patients With Atrial Fibrillation) Trial of Patients at Risk for Stroke With Nonvalvular Atrial Fibrillation. J Am Coll Cardiol 2013; 61:1790-8. Aline August DR, Mia [place], Price M, Whisenant B, Sievert H, Doshi S, Huber K, Reddy V. Prospective randomized evaluation of the Watchman left atrial appendage Device in patients with atrial fibrillation versus long-term warfarin therapy; the PREVAIL trial. Journal of the [employer] of Cardiology, Vol. 4, No. 1, 2014, 1-11. - Kar S, Doshi SK, Sadhu A, Horton R, Osorio J et al. Primary outcome evaluation of a next-generation left atrial appendage closure device: results from the PINNACLE FLX trial. Circulation 2021;143(18)1754-1762.    After today's visit with the patient which was dedicated solely for shared decision making visit regarding LAA closure device, the patient decided to proceed with the LAA appendage closure procedure scheduled to be done in the near future at [facility]. Prior to the procedure, I would like to obtain a gated CT scan of the chest with contrast timed for PV/LA visualization.   HAS-BLED score 4 Hypertension Yes  Abnormal renal and liver function (Dialysis, transplant, Cr >2.26 mg/dL /Cirrhosis or Bilirubin >2x Normal or AST/ALT/AP >3x Normal) No  Stroke Yes  Bleeding Yes  Labile INR (Unstable/high INR) No  Elderly (>65) Yes  Drugs or alcohol (? 8 drinks/week, anti-plt or NSAID) No   CHA2DS2-VASc Score = 5  The patient's score is based upon: CHF History: 1 HTN History: 1 Diabetes History: 0 Stroke History: 2 Vascular Disease History: 0 Age Score: 1 Gender Score:  0     Total time spent with patient today 60 minutes. This includes reviewing records, evaluating the patient and coordinating care.  Medication Adjustments/Labs and Tests Ordered: Current medicines are reviewed at length  with the patient today.  Concerns regarding medicines are outlined above.  Orders Placed This Encounter  Procedures   CT CARDIAC MORPH/PULM VEIN W/CM&W/O CA SCORE   Basic Metabolic Panel (BMET)   ECHOCARDIOGRAM COMPLETE   No orders of the defined types were placed in this encounter.    Signed, Rossie Muskratameron T. Lalla BrothersLambert, MD, [facility] Psychiatry CenterFACC, [facility]RS 04/16/2022 10:13 PM    Electrophysiology [facility]

## 2022-04-16 ENCOUNTER — Ambulatory Visit (INDEPENDENT_AMBULATORY_CARE_PROVIDER_SITE_OTHER): Payer: Medicare Other | Admitting: Cardiology

## 2022-04-16 ENCOUNTER — Encounter: Payer: Self-pay | Admitting: Cardiology

## 2022-04-16 ENCOUNTER — Encounter: Payer: Self-pay | Admitting: *Deleted

## 2022-04-16 VITALS — BP 122/64 | HR 61 | Ht 67.0 in | Wt 174.0 lb

## 2022-04-16 DIAGNOSIS — I480 Paroxysmal atrial fibrillation: Secondary | ICD-10-CM

## 2022-04-16 DIAGNOSIS — R319 Hematuria, unspecified: Secondary | ICD-10-CM

## 2022-04-16 DIAGNOSIS — I63411 Cerebral infarction due to embolism of right middle cerebral artery: Secondary | ICD-10-CM | POA: Diagnosis not present

## 2022-04-16 NOTE — Patient Instructions (Addendum)
Medication Instructions:  Your physician recommends that you continue on your current medications as directed. Please refer to the Current Medication list given to you today. *If you need a refill on your cardiac medications before your next appointment, please call your pharmacy*  Lab Work: BMP If you have labs (blood work) drawn today and your tests are completely normal, you will receive your results only by: MyChart Message (if you have MyChart) OR A paper copy in the mail If you have any lab test that is abnormal or we need to change your treatment, we will call you to review the results.  Testing/Procedures: Your physician has requested that you have an echocardiogram. Echocardiography is a painless test that uses sound waves to create images of your heart. It provides your doctor with information about the size and shape of your heart and how well your heart's chambers and valves are working. This procedure takes approximately one hour. There are no restrictions for this procedure.  Your physician has requested that you have cardiac CT. Cardiac computed tomography (CT) is a painless test that uses an x-ray machine to take clear, detailed pictures of your heart. For further information please visit www.cardiosmart.org. Please follow instruction sheet as given.    Follow-Up: At [facility], you and your health needs are our priority.  As part of our continuing mission to provide you with exceptional heart care, we have created designated Provider Care Teams.  These Care Teams include your primary Cardiologist (physician) and Advanced Practice Providers (APPs -  Physician Assistants and Nurse Practitioners) who all work together to provide you with the care you need, when you need it.  Your physician wants you to follow-up in: Katy Kemp, the Watchman Nurse Navigator, will call you after your CT once the Watchman Team has reviewed your imaging for an update on proceedings. Katy's direct  number is [phone] if you need assistance.   We recommend signing up for the patient portal called "MyChart".  Sign up information is provided on this After Visit Summary.  MyChart is used to connect with patients for Virtual Visits (Telemedicine).  Patients are able to view lab/test results, encounter notes, upcoming appointments, etc.  Non-urgent messages can be sent to your provider as well.   To learn more about what you can do with MyChart, go to [email or web address].    Any Other Special Instructions Will Be Listed Below (If Applicable).  Left Atrial Appendage Closure Device Implantation  Left atrial appendage (LAA) closure device implantation is a procedure to put a small device in the LAA of the heart. The LAA is a small sac in the wall of the heart's left upper chamber. Blood clots can form in the LAA in people with atrial fibrillation (AFib). The device closes the LAA to help prevent a blood clot and stroke. AFib is a type of irregular or rapid heartbeat (arrhythmia). There is an increased risk of blood clots and stroke with AFib. This procedure helps to reduce that risk. Tell a health care provider about: Any allergies you have. All medicines you are taking, including vitamins, herbs, eye drops, creams, and over-the-counter medicines. Any problems you or family members have had with anesthetic medicines. Any blood disorders you have. Any surgeries you have had. Any medical conditions you have. Whether you are pregnant or may be pregnant. What are the risks? Generally, this is a safe procedure. However, problems may occur, including: Infection. Bleeding. Allergic reactions to medicines or dyes. Damage to nearby structures or organs.   Heart attack. Stroke. Blood clots. Changes in heart rhythm. Device failure. What happens before the procedure? Staying hydrated Follow instructions from your health care provider about hydration, which may include: Up to 2 hours before  the procedure - you may continue to drink clear liquids, such as water, clear fruit juice, black coffee, and plain tea. Eating and drinking restrictions Follow instructions from your health care provider about eating and drinking, which may include: 8 hours before the procedure - stop eating heavy meals or foods, such as meat, fried foods, or fatty foods. 6 hours before the procedure - stop eating light meals or foods, such as toast or cereal. 6 hours before the procedure - stop drinking milk or drinks that contain milk. 2 hours before the procedure - stop drinking clear liquids. Medicines Ask your health care provider about: Changing or stopping your regular medicines. This is especially important if you are taking diabetes medicines or blood thinners. Taking medicines such as aspirin and ibuprofen. These medicines can thin your blood. Do not take these medicines unless your health care provider tells you to take them. Taking over-the-counter medicines, vitamins, herbs, and supplements. Tests You may have blood tests and a physical exam. You may have an electrocardiogram (ECG). This test checks your heart's electrical patterns and rhythms. General instructions Do not use any products that contain nicotine or tobacco. These include cigarettes, chewing tobacco, and vaping devices, such as e-cigarettes. If you need help quitting, ask your health care provider. Ask your health care provider what steps will be taken to help prevent infection. These steps may include: Removing hair at the surgery site. Washing skin with a germ-killing soap. Taking antibiotic medicine. Plan to have a responsible adult take you home from the hospital or clinic. Plan to have a responsible adult care for you for the time you are told after you leave the hospital or clinic. This is important. What happens during the procedure? An IV will be inserted into one of your veins. You will be given one or more of the  following: A medicine to help you relax (sedative). A medicine to make you fall asleep (general anesthetic). A small incision will be made in your groin area. A small wire will be put through the incision and into a blood vessel. Dye may be injected so X-rays can be used to guide the wire through the blood vessel. A long, thin tube (catheter) will be put over the small wire and moved up through the blood vessel to reach your heart. The closure device will be moved through the catheter until it reaches your heart. A small hole will be made in the septum (transseptal puncture). The septum is a thin tissue that separates the upper two chambers of the heart. The device will be placed so that it closes the LAA. X-rays will be done to make sure the device is in the right place. The catheter and wire will be removed. The closure device will remain in your heart. After pressure is applied over the catheter site to prevent bleeding, a bandage (dressing) will be placed over the site where the catheter was inserted. The procedure may vary among health care providers and hospitals. What happens after the procedure? Your blood pressure, heart rate, breathing rate, and blood oxygen level will be monitored until you leave the hospital or clinic. You may have to wear compression stockings. These stockings help to prevent blood clots and reduce swelling in your legs. If you were given a sedative   during the procedure, it can affect you for several hours. Do not drive or operate machinery until your health care provider says it is safe. You may be given pain medicine. You may need to drink more fluids to wash (flush) the dye out of your body. Drink enough fluid to keep your urine pale yellow. Take over-the-counter and prescription medicines only as told by your health care provider. This is especially important if you were given blood thinners. Summary Left atrial appendage (LAA) closure device implantation is a  procedure that is done to put a small device in the LAA of the heart. The LAA is a small sac in the wall of the heart's left upper chamber. The device closes the LAA to prevent stroke and other problems. Follow instructions from your health care provider before and after the procedure. This information is not intended to replace advice given to you by your health care provider. Make sure you discuss any questions you have with your health care provider. Document Revised: 05/24/2020 Document Reviewed: 05/24/2020 Elsevier Patient Education  2023 Elsevier Inc.          

## 2022-04-17 LAB — BASIC METABOLIC PANEL
BUN/Creatinine Ratio: 12 (ref 10–24)
BUN: 11 mg/dL (ref 8–27)
CO2: 26 mmol/L (ref 20–29)
Calcium: 10 mg/dL (ref 8.6–10.2)
Chloride: 104 mmol/L (ref 96–106)
Creatinine, Ser: 0.93 mg/dL (ref 0.76–1.27)
Glucose: 95 mg/dL (ref 70–99)
Potassium: 5.1 mmol/L (ref 3.5–5.2)
Sodium: 145 mmol/L — ABNORMAL HIGH (ref 134–144)
eGFR: 86 mL/min/{1.73_m2} (ref >59)

## 2022-04-30 ENCOUNTER — Encounter (HOSPITAL_BASED_OUTPATIENT_CLINIC_OR_DEPARTMENT_OTHER): Payer: Self-pay | Admitting: Urology

## 2022-04-30 ENCOUNTER — Emergency Department (HOSPITAL_BASED_OUTPATIENT_CLINIC_OR_DEPARTMENT_OTHER): Payer: Medicare Other

## 2022-04-30 ENCOUNTER — Inpatient Hospital Stay (HOSPITAL_BASED_OUTPATIENT_CLINIC_OR_DEPARTMENT_OTHER)
Admission: EM | Admit: 2022-04-30 | Discharge: 2022-05-03 | DRG: 247 | Disposition: A | Discharge status: Home / Self Care | Payer: Medicare Other | Attending: Cardiovascular Disease | Admitting: Cardiovascular Disease

## 2022-04-30 ENCOUNTER — Encounter: Payer: Self-pay | Admitting: Orthopaedic Surgery

## 2022-04-30 ENCOUNTER — Ambulatory Visit (INDEPENDENT_AMBULATORY_CARE_PROVIDER_SITE_OTHER): Payer: Medicare Other | Admitting: Orthopaedic Surgery

## 2022-04-30 VITALS — BP 132/88 | HR 84 | Ht 67.0 in | Wt 174.0 lb

## 2022-04-30 DIAGNOSIS — K219 Gastro-esophageal reflux disease without esophagitis: Secondary | ICD-10-CM | POA: Diagnosis present

## 2022-04-30 DIAGNOSIS — I63411 Cerebral infarction due to embolism of right middle cerebral artery: Secondary | ICD-10-CM | POA: Diagnosis not present

## 2022-04-30 DIAGNOSIS — I213 ST elevation (STEMI) myocardial infarction of unspecified site: Principal | ICD-10-CM | POA: Diagnosis present

## 2022-04-30 DIAGNOSIS — Z833 Family history of diabetes mellitus: Secondary | ICD-10-CM

## 2022-04-30 DIAGNOSIS — I25110 Atherosclerotic heart disease of native coronary artery with unstable angina pectoris: Secondary | ICD-10-CM

## 2022-04-30 DIAGNOSIS — Z96643 Presence of artificial hip joint, bilateral: Secondary | ICD-10-CM | POA: Diagnosis not present

## 2022-04-30 DIAGNOSIS — I2111 ST elevation (STEMI) myocardial infarction involving right coronary artery: Secondary | ICD-10-CM | POA: Diagnosis not present

## 2022-04-30 DIAGNOSIS — E783 Hyperchylomicronemia: Secondary | ICD-10-CM | POA: Diagnosis present

## 2022-04-30 DIAGNOSIS — I959 Hypotension, unspecified: Secondary | ICD-10-CM | POA: Diagnosis not present

## 2022-04-30 DIAGNOSIS — Z7901 Long term (current) use of anticoagulants: Secondary | ICD-10-CM

## 2022-04-30 DIAGNOSIS — Z955 Presence of coronary angioplasty implant and graft: Secondary | ICD-10-CM

## 2022-04-30 DIAGNOSIS — I10 Essential (primary) hypertension: Secondary | ICD-10-CM | POA: Diagnosis present

## 2022-04-30 DIAGNOSIS — G8929 Other chronic pain: Secondary | ICD-10-CM | POA: Diagnosis not present

## 2022-04-30 DIAGNOSIS — I248 Other forms of acute ischemic heart disease: Secondary | ICD-10-CM | POA: Diagnosis not present

## 2022-04-30 DIAGNOSIS — Z79899 Other long term (current) drug therapy: Secondary | ICD-10-CM

## 2022-04-30 DIAGNOSIS — E785 Hyperlipidemia, unspecified: Secondary | ICD-10-CM | POA: Diagnosis not present

## 2022-04-30 DIAGNOSIS — I2582 Chronic total occlusion of coronary artery: Secondary | ICD-10-CM | POA: Diagnosis not present

## 2022-04-30 DIAGNOSIS — F17290 Nicotine dependence, other tobacco product, uncomplicated: Secondary | ICD-10-CM | POA: Diagnosis present

## 2022-04-30 DIAGNOSIS — M545 Low back pain, unspecified: Secondary | ICD-10-CM

## 2022-04-30 DIAGNOSIS — Z8042 Family history of malignant neoplasm of prostate: Secondary | ICD-10-CM | POA: Diagnosis not present

## 2022-04-30 DIAGNOSIS — Z8249 Family history of ischemic heart disease and other diseases of the circulatory system: Secondary | ICD-10-CM | POA: Diagnosis not present

## 2022-04-30 DIAGNOSIS — Z818 Family history of other mental and behavioral disorders: Secondary | ICD-10-CM

## 2022-04-30 DIAGNOSIS — M5459 Other low back pain: Secondary | ICD-10-CM

## 2022-04-30 DIAGNOSIS — N400 Enlarged prostate without lower urinary tract symptoms: Secondary | ICD-10-CM | POA: Diagnosis present

## 2022-04-30 DIAGNOSIS — I2119 ST elevation (STEMI) myocardial infarction involving other coronary artery of inferior wall: Secondary | ICD-10-CM | POA: Diagnosis not present

## 2022-04-30 DIAGNOSIS — I480 Paroxysmal atrial fibrillation: Secondary | ICD-10-CM | POA: Diagnosis not present

## 2022-04-30 DIAGNOSIS — F17200 Nicotine dependence, unspecified, uncomplicated: Secondary | ICD-10-CM | POA: Diagnosis present

## 2022-04-30 DIAGNOSIS — R0789 Other chest pain: Secondary | ICD-10-CM | POA: Diagnosis present

## 2022-04-30 DIAGNOSIS — R079 Chest pain, unspecified: Secondary | ICD-10-CM | POA: Diagnosis not present

## 2022-04-30 LAB — CBC WITH DIFFERENTIAL/PLATELET
Abs Immature Granulocytes: 0.03 10*3/uL (ref 0.00–0.07)
Basophils Absolute: 0.1 10*3/uL (ref 0.0–0.1)
Basophils Relative: 1 %
Eosinophils Absolute: 0 10*3/uL (ref 0.0–0.5)
Eosinophils Relative: 0 %
HCT: 41.7 % (ref 39.0–52.0)
Hemoglobin: 13.8 g/dL (ref 13.0–17.0)
Immature Granulocytes: 0 %
Lymphocytes Relative: 31 %
Lymphs Abs: 3.3 10*3/uL (ref 0.7–4.0)
MCH: 30 pg (ref 26.0–34.0)
MCHC: 33.1 g/dL (ref 30.0–36.0)
MCV: 90.7 fL (ref 80.0–100.0)
Monocytes Absolute: 0.9 10*3/uL (ref 0.1–1.0)
Monocytes Relative: 8 %
Neutro Abs: 6.5 10*3/uL (ref 1.7–7.7)
Neutrophils Relative %: 60 %
Platelets: 204 10*3/uL (ref 150–400)
RBC: 4.6 MIL/uL (ref 4.22–5.81)
RDW: 14 % (ref 11.5–15.5)
WBC: 10.8 10*3/uL — ABNORMAL HIGH (ref 4.0–10.5)
nRBC: 0 % (ref 0.0–0.2)

## 2022-04-30 MED ORDER — FENTANYL CITRATE PF 50 MCG/ML IJ SOSY: 50.0000 ug | PREFILLED_SYRINGE | Freq: Once | INTRAMUSCULAR | Status: DC

## 2022-04-30 MED ORDER — FENTANYL CITRATE PF 50 MCG/ML IJ SOSY
PREFILLED_SYRINGE | INTRAMUSCULAR | Status: AC
  Administered 2022-04-30: 50 ug via INTRAVENOUS
  Filled 2022-04-30: qty 1

## 2022-04-30 MED ORDER — ASPIRIN 81 MG PO CHEW
CHEWABLE_TABLET | ORAL | Status: AC
  Administered 2022-04-30: 324 mg
  Filled 2022-04-30: qty 4

## 2022-04-30 MED ORDER — NITROGLYCERIN 0.4 MG SL SUBL
0.4000 mg | SUBLINGUAL_TABLET | SUBLINGUAL | Status: DC | PRN
  Administered 2022-04-30: 0.4 mg via SUBLINGUAL
  Filled 2022-04-30: qty 1

## 2022-04-30 MED ORDER — ONDANSETRON HCL 4 MG/2ML IJ SOLN
INTRAMUSCULAR | Status: AC
  Administered 2022-04-30: 4 mg via INTRAVENOUS
  Filled 2022-04-30: qty 2

## 2022-04-30 MED ORDER — ONDANSETRON HCL 4 MG/2ML IJ SOLN: 4.0000 mg | Freq: Once | INTRAMUSCULAR | Status: DC

## 2022-04-30 MED ORDER — HEPARIN BOLUS VIA INFUSION: 4000.0000 [IU] | Freq: Once | INTRAVENOUS | Status: DC

## 2022-04-30 MED ORDER — ASPIRIN 325 MG PO TABS: 325.0000 mg | ORAL_TABLET | Freq: Every day | ORAL | Status: DC

## 2022-04-30 MED ORDER — HEPARIN SODIUM (PORCINE) 5000 UNIT/ML IJ SOLN
INTRAMUSCULAR | Status: AC
  Administered 2022-04-30: 4000 [IU]
  Filled 2022-04-30: qty 1

## 2022-04-30 NOTE — ED Notes (Signed)
[facility] 5 min out

## 2022-04-30 NOTE — ED Provider Notes (Addendum)
[facility] Provider Note: [name] Dell, MD, FACEP  CSN: [id number] MRN: [id number] ARRIVAL: 04/30/22 at 2336 ROOM: [id number]   CHIEF COMPLAINT  Chest Pain   HISTORY OF PRESENT ILLNESS  04/30/22 11:43 PM [name] Short is a 76 y.o. male with history of atrial fibrillation on Eliquis.  He played tennis this evening after which he ate [employer].  After playing tennis he felt "bad".  Around 1030 he began having left-sided chest pain radiating to his left arm.  This is associated with shortness of breath transient diaphoresis nausea and vomiting.  He currently rates his pain as an 8 out of 10.   Past Medical History:  Diagnosis Date   Arthritis    WRIST   Benign localized prostatic hyperplasia with lower urinary tract symptoms (LUTS)    Complication of anesthesia    slow to wake   ED (erectile dysfunction)    GERD (gastroesophageal reflux disease)    History of concussion    AS CHILD--  NO RESIDUAL   History of gout    History of kidney stones    Hyperlipidemia    Inguinal hernia, bilateral    Nephrolithiasis    BILATERAL   PAF (paroxysmal atrial fibrillation) (HCC) currently being followed by pcp   EPISODE --  2011  FOLLOW-UP W/ DR WALL  /  HAS NOT SEEN ANY CARDIOLOGIST SINCE    Past Surgical History:  Procedure Laterality Date   APPENDECTOMY  1983   CARDIOVASCULAR STRESS TEST  05-08-2010  DR WALL   NO EVIDENCE OF SCAR OR ISCHEMIA/ EF 54%/  PT HAD BOTH AFIB/ AFLUTTER DURING STUDY   CARPAL TUNNEL RELEASE Right 01/ 14/ 2021   CYSTOSCOPY W/ URETERAL STENT PLACEMENT Left 09/20/2013   Procedure: CYSTOSCOPY WITH STENT REPLACEMENT;  Surgeon: Antony Haste, MD;  Location: [facility];  Service: Urology;  Laterality: Left;   CYSTOSCOPY WITH URETEROSCOPY Left 09/20/2013   Procedure: CYSTOSCOPY WITH URETEROSCOPY;  Surgeon: Antony Haste, MD;  Location: [facility];  Service: Urology;  Laterality: Left;   CYSTOSCOPY  WITH URETEROSCOPY AND STENT PLACEMENT Left 07/12/2013   Procedure: CYSTOSCOPY WITH LITHOLAPEXY, LEFT URETERAL STENT PLACEMENT;  Surgeon: Antony Haste, MD;  Location: [facility];  Service: Urology;  Laterality: Left;   CYSTOSCOPY WITH URETEROSCOPY AND STENT PLACEMENT Bilateral 06/23/2014   Procedure: BILATERAL URETEROSCOPY, HOLMIUM LASER LITHOTRIPSY AND STENT PLACEMENT, RIGHT ;  Surgeon: Jerilee Field, MD;  Location: [facility];  Service: Urology;  Laterality: Bilateral;   EXTRACORPOREAL SHOCK WAVE LITHOTRIPSY  X2   EXTRACORPOREAL SHOCK WAVE LITHOTRIPSY Left 08-29-2013;   07-28-2013   EXTRACORPOREAL SHOCK WAVE LITHOTRIPSY Right 12/09/2018   Procedure: EXTRACORPOREAL SHOCK WAVE LITHOTRIPSY (ESWL);  Surgeon: Jerilee Field, MD;  Location: [facility];  Service: Urology;  Laterality: Right;   HOLMIUM LASER APPLICATION Left 09/20/2013   Procedure: HOLMIUM LASER APPLICATION;  Surgeon: Antony Haste, MD;  Location: [facility];  Service: Urology;  Laterality: Left;   INGUINAL HERNIA REPAIR Bilateral 10/16/2020   Procedure: BILATERAL OPEN INGUINAL HERNIA REPAIR WITH MESH;  Surgeon: Abigail Miyamoto, MD;  Location: [facility];  Service: General;  Laterality: Bilateral;  LMA/TAP BLOCK   LAPAROSCOPIC INGUINAL HERNIA REPAIR Bilateral 09-04-2010   w/ mesh   TONSILLECTOMY  AS CHILD   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   TOTAL HIP ARTHROPLASTY Right 11/15/2012   Procedure: RIGHT TOTAL HIP ARTHROPLASTY ANTERIOR APPROACH;  Surgeon: Eldred Manges, MD;  Location: [facility];  Service: Orthopedics;  Laterality: Right;  Right total hip arthroplasty   TRANSTHORACIC ECHOCARDIOGRAM  05-08-2010   NORMAL LVSF/  EF 55%/  GRADE I DIASTOLIC DYSFUNCTION/  MILD BILATERAL ATRIUM ENLARGEMENT    Family History  Problem Relation Age of Onset   Diabetes Mother    Arthritis Mother    Rheum arthritis Mother    Dementia Mother    Heart disease  Father    Prostate cancer Father    Diabetes Sister    Diabetes Maternal Grandmother    Diabetes Maternal Grandfather    Bipolar disorder Brother    Diabetes Brother    Dementia Brother    Neuropathy Brother     Social History   Tobacco Use   Smoking status: Light Smoker    Types: Cigars   Smokeless tobacco: Never   Tobacco comments:    AVERAGE 3 CIGARS PER WEEK  Vaping Use   Vaping Use: Never used  Substance Use Topics   Alcohol use: Not Currently    Comment: occasional   Drug use: No    Prior to Admission medications   Medication Sig Start Date End Date Taking? Authorizing Provider  allopurinol (ZYLOPRIM) 100 MG tablet Take 1 tablet (100 mg total) by mouth daily. 02/12/22   Sheliah Hatch, MD  apixaban (ELIQUIS) 5 MG TABS tablet Take 1 tablet (5 mg total) by mouth 2 (two) times daily. 11/22/20   Little Ishikawa, MD  Cimetidine (TAGAMET PO) Take 1 tablet by mouth daily as needed (for reflux).    [provider]  Colchicine 0.6 MG CAPS Take 0.6 mg by mouth daily as needed (for gout flares). 02/12/22   Sheliah Hatch, MD  metoprolol succinate (TOPROL-XL) 25 MG 24 hr tablet TAKE ONE TABLET BY MOUTH DAILY **STOP DILTIAZEM** 03/07/22   Little Ishikawa, MD  Multiple Vitamin (MULTIVITAMIN WITH MINERALS) TABS Take 1 tablet by mouth daily.    [provider]  Polyethylene Glycol 400 (BLINK TEARS) 0.25 % SOLN Place 1 drop into both eyes 2 (two) times daily as needed (for dryness or irritation).    [provider]  rosuvastatin (CRESTOR) 20 MG tablet TAKE ONE TABLET BY MOUTH DAILY 03/07/22   Little Ishikawa, MD  tadalafil (CIALIS) 5 MG tablet Take 5 mg by mouth daily as needed for erectile dysfunction.    [provider]    Allergies Shellfish-derived products and Warfarin sodium   REVIEW OF SYSTEMS  Negative except as noted here or in the History of Present Illness.   PHYSICAL EXAMINATION  Initial Vital  Signs Blood pressure 112/77, pulse (!) 52, temperature 97.9 F (36.6 C), temperature source Oral, resp. rate (!) 24, height 5\' 7"  (1.702 m), weight 77.1 kg, SpO2 100 %.  Examination General: Well-developed, well-nourished male in no acute distress; appearance consistent with age of record HENT: normocephalic; atraumatic Eyes: pupils equal, round and reactive to light; extraocular muscles intact Neck: supple Heart: Irregular rhythm Lungs: clear to auscultation bilaterally Abdomen: soft; nondistended; nontender; no masses or hepatosplenomegaly; bowel sounds present Extremities: No deformity; full range of motion; pulses normal Neurologic: Awake, alert and oriented; motor function intact in all extremities and symmetric; no facial droop Skin: Warm and dry Psychiatric: Anxious mood with congruent affect   RESULTS  Summary of this visit's results, reviewed and interpreted by myself:   EKG Interpretation  Date/Time:  Wednesday April 30 2022 23:45:23 EDT Ventricular Rate:  60 PR Interval:    QRS Duration:  99 QT Interval:  420 QTC Calculation: 423 R Axis:   77 Text Interpretation: Atrial fibrillation Inferoposterior infarct, acute (RCA) Lateral infarct, acute Probable RV involvement, suggest recording right precordial leads ** ** ACUTE MI / STEMI ** ** Confirmed by Owen Pagnotta ([id number]) on 04/30/2022 11:56:09 PM       Laboratory Studies: Results for orders placed or performed during the hospital encounter of 04/30/22 (from the past 24 hour(s))  Basic metabolic panel     Status: Abnormal   Collection Time: 04/30/22 11:46 PM  Result Value Ref Range   Sodium 139 135 - 145 mmol/L   Potassium 3.0 (L) 3.5 - 5.1 mmol/L   Chloride 105 98 - 111 mmol/L   CO2 23 22 - 32 mmol/L   Glucose, Bld 125 (H) 70 - 99 mg/dL   BUN 16 8 - 23 mg/dL   Creatinine, Ser 0.27 (H) 0.61 - 1.24 mg/dL   Calcium 9.2 8.9 - 25.3 mg/dL   GFR, Estimated 51 (L) >60 mL/min   Anion gap 11 5 - 15  Troponin I (High  Sensitivity)     Status: None   Collection Time: 04/30/22 11:46 PM  Result Value Ref Range   Troponin I (High Sensitivity) 5 <18 ng/L  CBC with Differential     Status: Abnormal   Collection Time: 04/30/22 11:46 PM  Result Value Ref Range   WBC 10.8 (H) 4.0 - 10.5 K/uL   RBC 4.60 4.22 - 5.81 MIL/uL   Hemoglobin 13.8 13.0 - 17.0 g/dL   HCT 66.4 40.3 - 47.4 %   MCV 90.7 80.0 - 100.0 fL   MCH 30.0 26.0 - 34.0 pg   MCHC 33.1 30.0 - 36.0 g/dL   RDW 25.9 56.3 - 87.5 %   Platelets 204 150 - 400 K/uL   nRBC 0.0 0.0 - 0.2 %   Neutrophils Relative % 60 %   Neutro Abs 6.5 1.7 - 7.7 K/uL   Lymphocytes Relative 31 %   Lymphs Abs 3.3 0.7 - 4.0 K/uL   Monocytes Relative 8 %   Monocytes Absolute 0.9 0.1 - 1.0 K/uL   Eosinophils Relative 0 %   Eosinophils Absolute 0.0 0.0 - 0.5 K/uL   Basophils Relative 1 %   Basophils Absolute 0.1 0.0 - 0.1 K/uL   Immature Granulocytes 0 %   Abs Immature Granulocytes 0.03 0.00 - 0.07 K/uL   Imaging Studies: DG Chest Portable 1 View  Result Date: 05/01/2022 CLINICAL DATA:  Chest pain. EXAM: PORTABLE CHEST 1 VIEW COMPARISON:  10/21/2020 FINDINGS: Stable heart size and mediastinal contours. Diffuse peribronchial thickening is new from prior. May be a slight right pleural effusion. No confluent consolidation. No pneumothorax. IMPRESSION: Diffuse peribronchial thickening is new from prior exam and may be pulmonary edema or bronchitis. Possible right pleural effusion. Electronically Signed   By: Narda Rutherford M.D.   On: 05/01/2022 00:06    ED COURSE and MDM  Nursing notes, initial and subsequent vitals signs, including pulse oximetry, reviewed and interpreted by myself.  Vitals:   04/30/22 2342 04/30/22 2343 04/30/22 2352 04/30/22 2358  BP:  112/77 112/77 118/75  Pulse:  (!) 56 (!) 52 (!) 59  Resp:  20 (!) 24 14  Temp:   97.9 F (36.6 C)   TempSrc:  Oral Oral   SpO2:  100% 100% 100%  Weight: 77.1 kg     Height: 5\' 7"  (1.702 m)      Medications   allopurinol (ZYLOPRIM) tablet 100 mg (has  no administration in time range)  aspirin EC tablet 81 mg (has no administration in time range)  acetaminophen (TYLENOL) tablet 650 mg (has no administration in time range)  ondansetron (ZOFRAN) injection 4 mg (has no administration in time range)  rosuvastatin (CRESTOR) tablet 40 mg (has no administration in time range)  lidocaine (PF) (XYLOCAINE) 1 % injection (2 mLs Infiltration Given 05/01/22 0039)  Radial Cocktail/Verapamil only (10 mLs Intra-arterial Given 05/01/22 0043)  heparin sodium (porcine) injection (8,000 Units Intravenous Given 05/01/22 0044)  0.9 %  sodium chloride infusion (999 mL/hr Intravenous Rate/Dose Change 05/01/22 0045)  aspirin 81 MG chewable tablet (324 mg  Given 04/30/22 2355)  heparin 5000 UNIT/ML injection (4,000 Units  Given 04/30/22 2356)   EKG diagnostic of an inferior wall ST elevation myocardial infarction.  Code STEMI called.  Discussed with Dr. Clifton James of cardiology who will meet the patient in the Cath Lab at [facility].  [facility] has been dispatched for transport.  Patient given Zofran, fentanyl, aspirin and, after discussion with Dr. Clifton James, heparin bolus (4000u).  11:59 PM [facility] at bedside.   PROCEDURES  Procedures CRITICAL CARE Performed by: Carlisle Beers Darchelle Nunes Total critical care time: 30 minutes Critical care time was exclusive of separately billable procedures and treating other patients. Critical care was necessary to treat or prevent imminent or life-threatening deterioration. Critical care was time spent personally by me on the following activities: development of treatment plan with patient and/or surrogate as well as nursing, discussions with consultants, evaluation of patient's response to treatment, examination of patient, obtaining history from patient or surrogate, ordering and performing treatments and interventions, ordering and review of laboratory studies, ordering and review of radiographic  studies, pulse oximetry and re-evaluation of patient's condition.   ED DIAGNOSES     ICD-10-CM   1. Acute ST elevation myocardial infarction (STEMI), unspecified artery (HCC)  I21.3          Noell Lorensen, MD 05/01/22 0004    Paula Libra, MD 05/01/22 0048    Jamielynn Wigley, Jonny Ruiz, MD 05/01/22 [id number]

## 2022-04-30 NOTE — ED Triage Notes (Signed)
Chest pain that started tonight at 2230 while resting, reports SOB, N/V noted  States left arm numbness  H/o stroke and AFIB

## 2022-04-30 NOTE — Progress Notes (Signed)
Office Visit Note   Patient: [name] Short           Date of Birth: 05/21/1946           MRN: [id number] Visit Date: 04/30/2022              Requested by: Sheliah Hatch, MD [address] [place],  [place] [address] PCP: Sheliah Hatch, MD   Assessment & Plan: Visit Diagnoses:  1. Chronic bilateral low back pain, unspecified whether sciatica present     Plan: We will proceed with MRI scan has had symptoms for more than 3 months treated conservatively with persistent symptoms.  Follow-up after MRI scan.  Follow-Up Instructions: No follow-ups on file.   Orders:  Orders Placed This Encounter  Procedures   MR Lumbar Spine w/o contrast   No orders of the defined types were placed in this encounter.     Procedures: No procedures performed   Clinical Data: No additional findings.   Subjective: Chief Complaint  Patient presents with   Lower Back - Pain   Left Knee - Pain    HPI 76 year old male with history of CVA returns with ongoing problems with back pain left leg pain primarily anterior thigh.  Plain radiographs show anterolisthesis at L3-4.  Patient has been treated with a prednisone Dosepak with slight improvement.  He is using Tylenol which did not help he is currently on Eliquis.  He has some numbness and tingling in both feet more problems left minimal or no problems in the right thigh area.  He has had bilateral total hip arthroplasties both doing well.  Pain bothers him with daily activities.  He went to the beach and then states taking many hours since he could not load the car and cannot leave the beach due to his back pain.  Several episodes where he is not able to participate in normal activities.  Review of Systems all systems updated noncontributory does have history of gout in the past without current gout symptoms.   Objective: Vital Signs: BP 132/88   Pulse 84   Ht 5\' 7"  (1.702 m)   Wt 174 lb (78.9 kg)   BMI 27.25 kg/m    Physical Exam Constitutional:      Appearance: He is well-developed.  HENT:     Head: Normocephalic and atraumatic.     Right Ear: External ear normal.     Left Ear: External ear normal.  Eyes:     Pupils: Pupils are equal, round, and reactive to light.  Neck:     Thyroid: No thyromegaly.     Trachea: No tracheal deviation.  Cardiovascular:     Rate and Rhythm: Normal rate.  Pulmonary:     Effort: Pulmonary effort is normal.     Breath sounds: No wheezing.  Abdominal:     General: Bowel sounds are normal.     Palpations: Abdomen is soft.  Musculoskeletal:     Cervical back: Neck supple.  Skin:    General: Skin is warm and dry.     Capillary Refill: Capillary refill takes less than 2 seconds.  Neurological:     Mental Status: He is alert and oriented to person, place, and time.  Psychiatric:        Behavior: Behavior normal.        Thought Content: Thought content normal.        Judgment: Judgment normal.     Ortho Exam negative logroll the  hips negative straight leg raising 90 degrees.  He has some sciatic notch tenderness on the left knee and ankle jerk are intact.  Good range of motion of his hips.  Specialty Comments:  No specialty comments available.  Imaging: No results found.   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/14/2014   Physical exam, annual 05/17/2012   Inguinal hernia  08/12/2010   HYPERLIPIDEMIA TYPE I / IV 05/01/2010   TOBACCO USER 05/01/2010   Paroxysmal a-fib (HCC) 05/01/2010   ABNORMAL ELECTROCARDIOGRAM 03/26/2010   Past Medical History:  Diagnosis Date   Arthritis    WRIST   Benign localized prostatic hyperplasia with lower urinary tract symptoms (LUTS)    Complication of anesthesia    slow to wake   ED (erectile dysfunction)    GERD (gastroesophageal reflux disease)    History of concussion    AS CHILD--  NO RESIDUAL   History of gout    History of kidney stones    Hyperlipidemia    Inguinal hernia, bilateral    Nephrolithiasis    BILATERAL   PAF (paroxysmal atrial fibrillation) (HCC) currently being followed by pcp   EPISODE --  2011  FOLLOW-UP W/ DR WALL  /  HAS NOT SEEN ANY CARDIOLOGIST SINCE    Family History  Problem Relation Age of Onset   Diabetes Mother    Arthritis Mother    Rheum arthritis Mother    Dementia Mother    Heart disease Father    Prostate cancer Father    Diabetes Sister    Diabetes Maternal Grandmother    Diabetes Maternal Grandfather    Bipolar disorder Brother    Diabetes Brother    Dementia Brother    Neuropathy Brother     Past Surgical History:  Procedure Laterality Date   APPENDECTOMY  1983   CARDIOVASCULAR STRESS TEST  05-08-2010  DR WALL   NO EVIDENCE OF SCAR OR ISCHEMIA/ EF 54%/  PT HAD BOTH AFIB/ AFLUTTER DURING STUDY   CARPAL TUNNEL RELEASE Right 01/ 14/ 2021   CYSTOSCOPY W/ URETERAL STENT PLACEMENT Left 09/20/2013   Procedure: CYSTOSCOPY WITH STENT REPLACEMENT;  Surgeon: Antony Haste, MD;  Location: [facility];  Service: Urology;  Laterality: Left;   CYSTOSCOPY WITH URETEROSCOPY Left 09/20/2013   Procedure: CYSTOSCOPY WITH URETEROSCOPY;  Surgeon: Antony Haste, MD;  Location: [facility];  Service: Urology;  Laterality: Left;   CYSTOSCOPY WITH URETEROSCOPY AND STENT PLACEMENT Left 07/12/2013   Procedure: CYSTOSCOPY WITH LITHOLAPEXY,  LEFT URETERAL STENT PLACEMENT;  Surgeon: Antony Haste, MD;  Location: [facility];  Service: Urology;  Laterality: Left;   CYSTOSCOPY WITH URETEROSCOPY AND STENT PLACEMENT Bilateral 06/23/2014   Procedure: BILATERAL URETEROSCOPY, HOLMIUM LASER LITHOTRIPSY AND STENT PLACEMENT, RIGHT ;  Surgeon: Jerilee Field, MD;  Location: [facility];  Service: Urology;  Laterality: Bilateral;   EXTRACORPOREAL SHOCK WAVE LITHOTRIPSY  X2   EXTRACORPOREAL  SHOCK WAVE LITHOTRIPSY Left 08-29-2013;   07-28-2013   EXTRACORPOREAL SHOCK WAVE LITHOTRIPSY Right 12/09/2018   Procedure: EXTRACORPOREAL SHOCK WAVE LITHOTRIPSY (ESWL);  Surgeon: Jerilee Field, MD;  Location: [facility];  Service: Urology;  Laterality: Right;   HOLMIUM LASER APPLICATION Left 09/20/2013   Procedure: HOLMIUM LASER APPLICATION;  Surgeon: Antony Haste, MD;  Location[facility];  Service: Urology;  Laterality: Left;   INGUINAL HERNIA REPAIR Bilateral 10/16/2020   Procedure: BILATERAL OPEN INGUINAL HERNIA REPAIR WITH MESH;  Surgeon: Abigail Miyamoto, MD;  Location: [facility];  Service: General;  Laterality: Bilateral;  LMA/TAP BLOCK   LAPAROSCOPIC INGUINAL HERNIA REPAIR Bilateral 09-04-2010   w/ mesh   TONSILLECTOMY  AS CHILD   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   TOTAL HIP ARTHROPLASTY Right 11/15/2012   Procedure: RIGHT TOTAL HIP ARTHROPLASTY ANTERIOR APPROACH;  Surgeon: Eldred Manges, MD;  Location: [facility];  Service: Orthopedics;  Laterality: Right;  Right total hip arthroplasty   TRANSTHORACIC ECHOCARDIOGRAM  05-08-2010   NORMAL LVSF/  EF 55%/  GRADE I DIASTOLIC DYSFUNCTION/  MILD BILATERAL ATRIUM ENLARGEMENT   Social History   Occupational History   Occupation: retired  Tobacco Use   Smoking status: Light Smoker    Types: Cigars   Smokeless tobacco: Never   Tobacco comments:    AVERAGE 3 CIGARS PER WEEK  Vaping Use   Vaping Use: Never used   Substance and Sexual Activity   Alcohol use: Not Currently    Comment: occasional   Drug use: No   Sexual activity: Not on file

## 2022-05-01 ENCOUNTER — Inpatient Hospital Stay (HOSPITAL_COMMUNITY): Payer: Medicare Other

## 2022-05-01 ENCOUNTER — Encounter (HOSPITAL_COMMUNITY): Payer: Self-pay | Admitting: Cardiovascular Disease

## 2022-05-01 ENCOUNTER — Encounter (HOSPITAL_COMMUNITY)
Admission: EM | Disposition: A | Discharge status: Home / Self Care | Payer: Self-pay | Source: Home / Self Care | Attending: Cardiovascular Disease

## 2022-05-01 ENCOUNTER — Other Ambulatory Visit: Payer: Self-pay

## 2022-05-01 DIAGNOSIS — K219 Gastro-esophageal reflux disease without esophagitis: Secondary | ICD-10-CM | POA: Diagnosis not present

## 2022-05-01 DIAGNOSIS — I25110 Atherosclerotic heart disease of native coronary artery with unstable angina pectoris: Secondary | ICD-10-CM

## 2022-05-01 DIAGNOSIS — Z96643 Presence of artificial hip joint, bilateral: Secondary | ICD-10-CM | POA: Diagnosis not present

## 2022-05-01 DIAGNOSIS — Z818 Family history of other mental and behavioral disorders: Secondary | ICD-10-CM | POA: Diagnosis not present

## 2022-05-01 DIAGNOSIS — R079 Chest pain, unspecified: Secondary | ICD-10-CM | POA: Diagnosis not present

## 2022-05-01 DIAGNOSIS — I2119 ST elevation (STEMI) myocardial infarction involving other coronary artery of inferior wall: Secondary | ICD-10-CM

## 2022-05-01 DIAGNOSIS — I248 Other forms of acute ischemic heart disease: Secondary | ICD-10-CM

## 2022-05-01 DIAGNOSIS — E785 Hyperlipidemia, unspecified: Secondary | ICD-10-CM | POA: Diagnosis not present

## 2022-05-01 DIAGNOSIS — I959 Hypotension, unspecified: Secondary | ICD-10-CM | POA: Diagnosis not present

## 2022-05-01 DIAGNOSIS — Z7901 Long term (current) use of anticoagulants: Secondary | ICD-10-CM | POA: Diagnosis not present

## 2022-05-01 DIAGNOSIS — N400 Enlarged prostate without lower urinary tract symptoms: Secondary | ICD-10-CM | POA: Diagnosis present

## 2022-05-01 DIAGNOSIS — Z833 Family history of diabetes mellitus: Secondary | ICD-10-CM | POA: Diagnosis not present

## 2022-05-01 DIAGNOSIS — Z8249 Family history of ischemic heart disease and other diseases of the circulatory system: Secondary | ICD-10-CM | POA: Diagnosis not present

## 2022-05-01 DIAGNOSIS — I10 Essential (primary) hypertension: Secondary | ICD-10-CM | POA: Diagnosis not present

## 2022-05-01 DIAGNOSIS — I2111 ST elevation (STEMI) myocardial infarction involving right coronary artery: Secondary | ICD-10-CM

## 2022-05-01 DIAGNOSIS — I2582 Chronic total occlusion of coronary artery: Secondary | ICD-10-CM | POA: Diagnosis not present

## 2022-05-01 DIAGNOSIS — Z8042 Family history of malignant neoplasm of prostate: Secondary | ICD-10-CM | POA: Diagnosis not present

## 2022-05-01 DIAGNOSIS — F17290 Nicotine dependence, other tobacco product, uncomplicated: Secondary | ICD-10-CM | POA: Diagnosis present

## 2022-05-01 DIAGNOSIS — R0789 Other chest pain: Secondary | ICD-10-CM | POA: Diagnosis present

## 2022-05-01 DIAGNOSIS — I480 Paroxysmal atrial fibrillation: Secondary | ICD-10-CM | POA: Diagnosis not present

## 2022-05-01 DIAGNOSIS — Z79899 Other long term (current) drug therapy: Secondary | ICD-10-CM | POA: Diagnosis not present

## 2022-05-01 DIAGNOSIS — I213 ST elevation (STEMI) myocardial infarction of unspecified site: Secondary | ICD-10-CM | POA: Diagnosis present

## 2022-05-01 HISTORY — PX: LEFT HEART CATH AND CORONARY ANGIOGRAPHY: CATH118249

## 2022-05-01 HISTORY — PX: CORONARY/GRAFT ACUTE MI REVASCULARIZATION: CATH118305

## 2022-05-01 LAB — BASIC METABOLIC PANEL
Anion gap: 11 (ref 5–15)
Anion gap: 9 (ref 5–15)
BUN: 13 mg/dL (ref 8–23)
BUN: 16 mg/dL (ref 8–23)
CO2: 23 mmol/L (ref 22–32)
CO2: 23 mmol/L (ref 22–32)
Calcium: 8.6 mg/dL — ABNORMAL LOW (ref 8.9–10.3)
Calcium: 9.2 mg/dL (ref 8.9–10.3)
Chloride: 105 mmol/L (ref 98–111)
Chloride: 107 mmol/L (ref 98–111)
Creatinine, Ser: 1.1 mg/dL (ref 0.61–1.24)
Creatinine, Ser: 1.43 mg/dL — ABNORMAL HIGH (ref 0.61–1.24)
GFR, Estimated: 51 mL/min — ABNORMAL LOW (ref >60)
GFR, Estimated: >60 mL/min (ref >60)
Glucose, Bld: 110 mg/dL — ABNORMAL HIGH (ref 70–99)
Glucose, Bld: 125 mg/dL — ABNORMAL HIGH (ref 70–99)
Potassium: 3 mmol/L — ABNORMAL LOW (ref 3.5–5.1)
Potassium: 4.1 mmol/L (ref 3.5–5.1)
Sodium: 139 mmol/L (ref 135–145)
Sodium: 139 mmol/L (ref 135–145)

## 2022-05-01 LAB — COMPREHENSIVE METABOLIC PANEL
ALT: 18 U/L (ref 0–44)
AST: 28 U/L (ref 15–41)
Albumin: 3.4 g/dL — ABNORMAL LOW (ref 3.5–5.0)
Alkaline Phosphatase: 56 U/L (ref 38–126)
Anion gap: 12 (ref 5–15)
BUN: 15 mg/dL (ref 8–23)
CO2: 21 mmol/L — ABNORMAL LOW (ref 22–32)
Calcium: 8.8 mg/dL — ABNORMAL LOW (ref 8.9–10.3)
Chloride: 107 mmol/L (ref 98–111)
Creatinine, Ser: 1.28 mg/dL — ABNORMAL HIGH (ref 0.61–1.24)
GFR, Estimated: 59 mL/min — ABNORMAL LOW (ref >60)
Glucose, Bld: 134 mg/dL — ABNORMAL HIGH (ref 70–99)
Potassium: 3.5 mmol/L (ref 3.5–5.1)
Sodium: 140 mmol/L (ref 135–145)
Total Bilirubin: 0.8 mg/dL (ref 0.3–1.2)
Total Protein: 6.2 g/dL — ABNORMAL LOW (ref 6.5–8.1)

## 2022-05-01 LAB — LIPID PANEL
Cholesterol: 104 mg/dL (ref 0–200)
HDL: 44 mg/dL (ref >40)
LDL Cholesterol: 48 mg/dL (ref 0–99)
Total CHOL/HDL Ratio: 2.4 RATIO
Triglycerides: 62 mg/dL (ref <150)
VLDL: 12 mg/dL (ref 0–40)

## 2022-05-01 LAB — APTT: aPTT: 62 seconds — ABNORMAL HIGH (ref 24–36)

## 2022-05-01 LAB — CBC
HCT: 36.7 % — ABNORMAL LOW (ref 39.0–52.0)
HCT: 35.1 % — ABNORMAL LOW (ref 39.0–52.0)
Hemoglobin: 12.4 g/dL — ABNORMAL LOW (ref 13.0–17.0)
Hemoglobin: 11.8 g/dL — ABNORMAL LOW (ref 13.0–17.0)
MCH: 30.2 pg (ref 26.0–34.0)
MCH: 30.3 pg (ref 26.0–34.0)
MCHC: 33.8 g/dL (ref 30.0–36.0)
MCHC: 33.6 g/dL (ref 30.0–36.0)
MCV: 89.5 fL (ref 80.0–100.0)
MCV: 90 fL (ref 80.0–100.0)
Platelets: 179 10*3/uL (ref 150–400)
Platelets: 174 10*3/uL (ref 150–400)
RBC: 4.1 MIL/uL — ABNORMAL LOW (ref 4.22–5.81)
RBC: 3.9 MIL/uL — ABNORMAL LOW (ref 4.22–5.81)
RDW: 13.7 % (ref 11.5–15.5)
RDW: 14 % (ref 11.5–15.5)
WBC: 12.5 10*3/uL — ABNORMAL HIGH (ref 4.0–10.5)
WBC: 10 10*3/uL (ref 4.0–10.5)
nRBC: 0 % (ref 0.0–0.2)
nRBC: 0 % (ref 0.0–0.2)

## 2022-05-01 LAB — PROTIME-INR
INR: 1.5 — ABNORMAL HIGH (ref 0.8–1.2)
Prothrombin Time: 17.7 seconds — ABNORMAL HIGH (ref 11.4–15.2)

## 2022-05-01 LAB — ECHOCARDIOGRAM COMPLETE
AR max vel: 2.88 cm2
AV Area VTI: 2.81 cm2
AV Area mean vel: 2.83 cm2
AV Mean grad: 4 mmHg
AV Peak grad: 7.5 mmHg
Ao pk vel: 1.37 m/s
Area-P 1/2: 4.49 cm2
Height: 67 in
S' Lateral: 3.2 cm
Weight: 2973.56 oz

## 2022-05-01 LAB — TROPONIN I (HIGH SENSITIVITY)
Troponin I (High Sensitivity): 10835 ng/L (ref <18)
Troponin I (High Sensitivity): 12483 ng/L (ref <18)
Troponin I (High Sensitivity): 1600 ng/L (ref <18)
Troponin I (High Sensitivity): 5 ng/L (ref <18)
Troponin I (High Sensitivity): 9120 ng/L (ref <18)

## 2022-05-01 LAB — POCT ACTIVATED CLOTTING TIME
Activated Clotting Time: 348 seconds
Activated Clotting Time: 606 seconds

## 2022-05-01 LAB — BRAIN NATRIURETIC PEPTIDE: B Natriuretic Peptide: 64.5 pg/mL (ref 0.0–100.0)

## 2022-05-01 LAB — TSH: TSH: 2.377 u[IU]/mL (ref 0.350–4.500)

## 2022-05-01 LAB — HEMOGLOBIN A1C
Hgb A1c MFr Bld: 5.6 % (ref 4.8–5.6)
Mean Plasma Glucose: 114.02 mg/dL

## 2022-05-01 LAB — HEPARIN LEVEL (UNFRACTIONATED): Heparin Unfractionated: >1.1 IU/mL — ABNORMAL HIGH (ref 0.30–0.70)

## 2022-05-01 LAB — MRSA NEXT GEN BY PCR, NASAL: MRSA by PCR Next Gen: NOT DETECTED

## 2022-05-01 LAB — T4, FREE: Free T4: 1.13 ng/dL — ABNORMAL HIGH (ref 0.61–1.12)

## 2022-05-01 SURGERY — CORONARY/GRAFT ACUTE MI REVASCULARIZATION
Anesthesia: LOCAL

## 2022-05-01 MED ORDER — ROSUVASTATIN CALCIUM 20 MG PO TABS: 40.0000 mg | ORAL_TABLET | Freq: Every day | ORAL | Status: DC

## 2022-05-01 MED ORDER — POTASSIUM CHLORIDE CRYS ER 20 MEQ PO TBCR
40.0000 meq | EXTENDED_RELEASE_TABLET | Freq: Once | ORAL | Status: AC
Start: 2022-05-01 — End: 2022-05-01
  Administered 2022-05-01: 40 meq via ORAL
  Filled 2022-05-01: qty 2

## 2022-05-01 MED ORDER — SODIUM CHLORIDE 0.9 % IV SOLN
INTRAVENOUS | Status: AC | PRN
  Administered 2022-05-01: 10 mL/h via INTRAVENOUS

## 2022-05-01 MED ORDER — SODIUM CHLORIDE 0.9 % IV SOLN: INTRAVENOUS | Status: AC

## 2022-05-01 MED ORDER — SODIUM CHLORIDE 0.9% FLUSH
3.0000 mL | Freq: Two times a day (BID) | INTRAVENOUS | Status: DC
Start: 2022-05-01 — End: 2022-05-02
  Administered 2022-05-01: 3 mL via INTRAVENOUS

## 2022-05-01 MED ORDER — ACETAMINOPHEN 325 MG PO TABS: 650.0000 mg | ORAL_TABLET | ORAL | Status: DC | PRN

## 2022-05-01 MED ORDER — SODIUM CHLORIDE 0.9% FLUSH: 3.0000 mL | INTRAVENOUS | Status: DC | PRN

## 2022-05-01 MED ORDER — ONDANSETRON HCL 4 MG/2ML IJ SOLN: 4.0000 mg | Freq: Four times a day (QID) | INTRAMUSCULAR | Status: DC | PRN

## 2022-05-01 MED ORDER — ALLOPURINOL 100 MG PO TABS
100.0000 mg | ORAL_TABLET | Freq: Every day | ORAL | Status: DC
  Administered 2022-05-01 – 2022-05-03 (×3): 100 mg via ORAL
  Filled 2022-05-01 (×3): qty 1

## 2022-05-01 MED ORDER — HYDRALAZINE HCL 20 MG/ML IJ SOLN: 10.0000 mg | INTRAMUSCULAR | Status: AC | PRN

## 2022-05-01 MED ORDER — CLOPIDOGREL BISULFATE 75 MG PO TABS
75.0000 mg | ORAL_TABLET | Freq: Every day | ORAL | Status: DC
  Administered 2022-05-02 – 2022-05-03 (×2): 75 mg via ORAL
  Filled 2022-05-01 (×3): qty 1

## 2022-05-01 MED ORDER — SODIUM CHLORIDE 0.9 % IV SOLN
INTRAVENOUS | Status: DC
Start: 2022-05-02 — End: 2022-05-02

## 2022-05-01 MED ORDER — CHLORHEXIDINE GLUCONATE CLOTH 2 % EX PADS
6.0000 | MEDICATED_PAD | Freq: Every day | CUTANEOUS | Status: DC
  Administered 2022-05-02 (×2): 6 via TOPICAL

## 2022-05-01 MED ORDER — ASPIRIN 81 MG PO TBEC
81.0000 mg | DELAYED_RELEASE_TABLET | Freq: Every day | ORAL | Status: DC
  Administered 2022-05-02 – 2022-05-03 (×2): 81 mg via ORAL
  Filled 2022-05-01 (×2): qty 1

## 2022-05-01 MED ORDER — TIROFIBAN HCL IN NACL 5-0.9 MG/100ML-% IV SOLN
INTRAVENOUS | Status: AC | PRN
  Administered 2022-05-01: .075 ug/kg/min via INTRAVENOUS

## 2022-05-01 MED ORDER — ROSUVASTATIN CALCIUM 20 MG PO TABS
40.0000 mg | ORAL_TABLET | Freq: Every day | ORAL | Status: DC
  Administered 2022-05-01 – 2022-05-03 (×3): 40 mg via ORAL
  Filled 2022-05-01 (×4): qty 2

## 2022-05-01 MED ORDER — SODIUM CHLORIDE 0.9% FLUSH
3.0000 mL | INTRAVENOUS | Status: DC | PRN
Start: 2022-05-01 — End: 2022-05-02

## 2022-05-01 MED ORDER — LABETALOL HCL 5 MG/ML IV SOLN: 10.0000 mg | INTRAVENOUS | Status: AC | PRN

## 2022-05-01 MED ORDER — VERAPAMIL HCL 2.5 MG/ML IV SOLN
INTRAVENOUS | Status: DC | PRN
  Administered 2022-05-01: 10 mL via INTRA_ARTERIAL

## 2022-05-01 MED ORDER — LIDOCAINE HCL (PF) 1 % IJ SOLN
INTRAMUSCULAR | Status: DC | PRN
  Administered 2022-05-01: 2 mL

## 2022-05-01 MED ORDER — HEPARIN SODIUM (PORCINE) 1000 UNIT/ML IJ SOLN
INTRAMUSCULAR | Status: DC | PRN
  Administered 2022-05-01: 8000 [IU] via INTRAVENOUS

## 2022-05-01 MED ORDER — IOHEXOL 350 MG/ML SOLN
INTRAVENOUS | Status: DC | PRN
  Administered 2022-05-01: 80 mL via INTRA_ARTERIAL

## 2022-05-01 MED ORDER — CLOPIDOGREL BISULFATE 300 MG PO TABS
ORAL_TABLET | ORAL | Status: DC | PRN
  Administered 2022-05-01: 600 mg via ORAL

## 2022-05-01 MED ORDER — TIROFIBAN HCL IN NACL 5-0.9 MG/100ML-% IV SOLN
0.1500 ug/kg/min | INTRAVENOUS | Status: AC
  Administered 2022-05-01: 0.15 ug/kg/min via INTRAVENOUS

## 2022-05-01 MED ORDER — ASPIRIN 81 MG PO CHEW
81.0000 mg | CHEWABLE_TABLET | ORAL | Status: AC
  Administered 2022-05-02: 81 mg via ORAL
  Filled 2022-05-01: qty 1

## 2022-05-01 MED ORDER — ACETAMINOPHEN 325 MG PO TABS
650.0000 mg | ORAL_TABLET | ORAL | Status: DC | PRN
  Administered 2022-05-02 – 2022-05-03 (×2): 650 mg via ORAL
  Filled 2022-05-01 (×2): qty 2

## 2022-05-01 MED ORDER — TIROFIBAN (AGGRASTAT) BOLUS VIA INFUSION
INTRAVENOUS | Status: DC | PRN
  Administered 2022-05-01: 1927.5 ug via INTRAVENOUS

## 2022-05-01 MED ORDER — CLOPIDOGREL BISULFATE 75 MG PO TABS: 75.0000 mg | ORAL_TABLET | Freq: Every day | ORAL | Status: DC

## 2022-05-01 MED ORDER — SODIUM CHLORIDE 0.9% FLUSH
3.0000 mL | Freq: Two times a day (BID) | INTRAVENOUS | Status: DC
  Administered 2022-05-01 – 2022-05-02 (×4): 3 mL via INTRAVENOUS

## 2022-05-01 MED ORDER — SODIUM CHLORIDE 0.9 % IV SOLN: 250.0000 mL | INTRAVENOUS | Status: DC | PRN

## 2022-05-01 MED ORDER — HEPARIN (PORCINE) 25000 UT/250ML-% IV SOLN
1100.0000 [IU]/h | INTRAVENOUS | Status: AC
  Administered 2022-05-01: 1000 [IU]/h via INTRAVENOUS
  Filled 2022-05-01: qty 250

## 2022-05-01 SURGICAL SUPPLY — 18 items
BALL SAPPHIRE NC24 3.75X15 (BALLOONS) ×2
BALLN SAPPHIRE 2.5X12 (BALLOONS) ×2
BALLOON SAPPHIRE 2.5X12 (BALLOONS) IMPLANT
BALLOON SAPPHIRE NC24 3.75X15 (BALLOONS) IMPLANT
CATH 5FR JL3.5 JR4 ANG PIG MP (CATHETERS) ×2 IMPLANT
CATH LAUNCHER 6FR JR4 (CATHETERS) ×1 IMPLANT
DEVICE RAD COMP TR BAND LRG (VASCULAR PRODUCTS) ×1 IMPLANT
GLIDESHEATH SLEND SS 6F .021 (SHEATH) ×1 IMPLANT
GUIDEWIRE INQWIRE 1.5J.035X260 (WIRE) IMPLANT
INQWIRE 1.5J .035X260CM (WIRE) ×2
KIT ESSENTIALS PG (KITS) ×1 IMPLANT
KIT HEART LEFT (KITS) ×2 IMPLANT
PACK CARDIAC CATHETERIZATION (CUSTOM PROCEDURE TRAY) ×2 IMPLANT
STENT SYNERGY XD 3.50X20 (Permanent Stent) IMPLANT
SYNERGY XD 3.50X20 (Permanent Stent) ×2 IMPLANT
TRANSDUCER W/STOPCOCK (MISCELLANEOUS) ×2 IMPLANT
TUBING CIL FLEX 10 FLL-RA (TUBING) ×2 IMPLANT
WIRE COUGAR XT STRL 190CM (WIRE) ×1 IMPLANT

## 2022-05-01 NOTE — Progress Notes (Signed)
CARDIAC REHAB PHASE I     Pt resting in bed with wife at bedside. Pt to go to cath lab tomorrow. Post MI/ stent teaching begun. Teaching included MI booklet, restrictions, heart healthy diet, exercise guidelines, risk factors, and CRP2. Pt is interested in CRP2 not sure if he wants [facility] or [facility]. He and wife will discuss. Will place referral once he decides which is best for him. Will continue to follow.   [id number] Woodroe Chen, RN BSN 05/01/2022 3:07 PM

## 2022-05-01 NOTE — H&P (Addendum)
Cardiology Admission History and Physical:   Patient ID: [name] Short MRN: [id number]; DOB: 1946-03-30   Admission date: 04/30/2022  PCP:  Sheliah Hatch, MD   [facility] Providers Cardiologist:  Little Ishikawa, MD  Electrophysiologist:  Lanier Prude, MD       Chief Complaint:  chest pain  Patient Profile:   [name] Short is a 76 y.o. male with atrial fibrillation, on Eliquis, hypertension, hyperlipidemia who is being seen 05/01/2022 for the evaluation of chest pain.  History of Present Illness:   [name] Short is a 76 year old male with atrial fibrillation on anticoagulation with Eliquis, hypertension, hyperlipidemia who presented today with acute onset chest pain and left arm pain.  He was also having significant nausea and vomiting.  He was playing tennis yesterday and noted that he was feeling bad.  Subsequently began having chest pain with worsening shortness of breath and transient diaphoresis.  Given that it was persisting, this prompted him to come to the emergency department for evaluation.  On presentation he was noted to have ST elevation in the inferior leads III and aVF.  Reciprocal changes in V1 and V2.  Also have ST elevations in V4 and V5.  On presentation he was hypotensive.  At the outside ED he was given aspirin 325 mg, heparin 4000 units, Zofran, and fentanyl.  He got an initial dose of nitroglycerin which worsened his hypertension.  Code STEMI was called and he was emergently transferred to [facility] for emergent cardiac catheterization with Dr. Clifton James.      Past Medical History:  Diagnosis Date   Arthritis    WRIST   Benign localized prostatic hyperplasia with lower urinary tract symptoms (LUTS)    Complication of anesthesia    slow to wake   ED (erectile dysfunction)    GERD (gastroesophageal reflux disease)    History of concussion    AS CHILD--  NO RESIDUAL   History of gout    History of kidney stones     Hyperlipidemia    Inguinal hernia, bilateral    Nephrolithiasis    BILATERAL   PAF (paroxysmal atrial fibrillation) (HCC) currently being followed by pcp   EPISODE --  2011  FOLLOW-UP W/ DR WALL  /  HAS NOT SEEN ANY CARDIOLOGIST SINCE    Past Surgical History:  Procedure Laterality Date   APPENDECTOMY  1983   CARDIOVASCULAR STRESS TEST  05-08-2010  DR WALL   NO EVIDENCE OF SCAR OR ISCHEMIA/ EF 54%/  PT HAD BOTH AFIB/ AFLUTTER DURING STUDY   CARPAL TUNNEL RELEASE Right 01/ 14/ 2021   CYSTOSCOPY W/ URETERAL STENT PLACEMENT Left 09/20/2013   Procedure: CYSTOSCOPY WITH STENT REPLACEMENT;  Surgeon: Antony Haste, MD;  Location: [facility];  Service: Urology;  Laterality: Left;   CYSTOSCOPY WITH URETEROSCOPY Left 09/20/2013   Procedure: CYSTOSCOPY WITH URETEROSCOPY;  Surgeon: Antony Haste, MD;  Location: [facility];  Service: Urology;  Laterality: Left;   CYSTOSCOPY WITH URETEROSCOPY AND STENT PLACEMENT Left 07/12/2013   Procedure: CYSTOSCOPY WITH LITHOLAPEXY, LEFT URETERAL STENT PLACEMENT;  Surgeon: Antony Haste, MD;  Location: [facility];  Service: Urology;  Laterality: Left;   CYSTOSCOPY WITH URETEROSCOPY AND STENT PLACEMENT Bilateral 06/23/2014   Procedure: BILATERAL URETEROSCOPY, HOLMIUM LASER LITHOTRIPSY AND STENT PLACEMENT, RIGHT ;  Surgeon: Jerilee Field, MD;  Location: [facility];  Service: Urology;  Laterality: Bilateral;   EXTRACORPOREAL SHOCK WAVE LITHOTRIPSY  X2   EXTRACORPOREAL  SHOCK WAVE LITHOTRIPSY Left 08-29-2013;   07-28-2013   EXTRACORPOREAL SHOCK WAVE LITHOTRIPSY Right 12/09/2018   Procedure: EXTRACORPOREAL SHOCK WAVE LITHOTRIPSY (ESWL);  Surgeon: Jerilee Field, MD;  Location: [facility];  Service: Urology;  Laterality: Right;   HOLMIUM LASER APPLICATION Left 09/20/2013   Procedure: HOLMIUM LASER APPLICATION;  Surgeon: Antony Haste, MD;  Location: [facility];  Service: Urology;  Laterality: Left;   INGUINAL HERNIA REPAIR Bilateral 10/16/2020   Procedure: BILATERAL OPEN INGUINAL HERNIA REPAIR WITH MESH;  Surgeon: Abigail Miyamoto, MD;  Location: [facility];  Service: General;  Laterality: Bilateral;  LMA/TAP BLOCK   LAPAROSCOPIC INGUINAL HERNIA REPAIR Bilateral 09-04-2010   w/ mesh   TONSILLECTOMY  AS CHILD   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   TOTAL HIP ARTHROPLASTY Right 11/15/2012   Procedure: RIGHT TOTAL HIP ARTHROPLASTY ANTERIOR APPROACH;  Surgeon: Eldred Manges, MD;  Location: [facility];  Service: Orthopedics;  Laterality: Right;  Right total hip arthroplasty   TRANSTHORACIC ECHOCARDIOGRAM  05-08-2010   NORMAL LVSF/  EF 55%/  GRADE I DIASTOLIC DYSFUNCTION/  MILD BILATERAL ATRIUM ENLARGEMENT     Medications Prior to Admission: Prior to Admission medications   Medication Sig Start Date End Date Taking? Authorizing Provider  allopurinol (ZYLOPRIM) 100 MG tablet Take 1 tablet (100 mg total) by mouth daily. 02/12/22   Sheliah Hatch, MD  apixaban (ELIQUIS) 5 MG TABS tablet Take 1 tablet (5 mg total) by mouth 2 (two) times daily. 11/22/20   Little Ishikawa, MD  Cimetidine (TAGAMET PO) Take 1 tablet by mouth daily as needed (for reflux).    [provider]  Colchicine 0.6 MG CAPS Take 0.6 mg by mouth daily as needed (for gout flares). 02/12/22   Sheliah Hatch, MD  metoprolol succinate (TOPROL-XL) 25 MG 24 hr tablet TAKE ONE TABLET BY MOUTH DAILY **STOP DILTIAZEM** 03/07/22   Little Ishikawa, MD  Multiple Vitamin (MULTIVITAMIN WITH MINERALS) TABS Take 1 tablet by mouth daily.    [provider]  Polyethylene Glycol 400 (BLINK TEARS) 0.25 % SOLN Place 1 drop into both eyes 2 (two) times daily as needed (for dryness or irritation).    [provider]  rosuvastatin (CRESTOR) 20 MG tablet TAKE ONE TABLET BY MOUTH DAILY 03/07/22   Little Ishikawa, MD  tadalafil (CIALIS) 5 MG  tablet Take 5 mg by mouth daily as needed for erectile dysfunction.    [provider]     Allergies:    Allergies  Allergen Reactions   Shellfish-Derived Products Other (See Comments)    Gout    Warfarin Sodium Rash    Social History:   Social History   Socioeconomic History   Marital status: Married    Spouse name: Not on file   Number of children: Not on file   Years of education: Not on file   Highest education level: Not on file  Occupational History   Occupation: retired  Tobacco Use   Smoking status: Light Smoker    Types: Cigars   Smokeless tobacco: Never   Tobacco comments:    AVERAGE 3 CIGARS PER WEEK  Vaping Use   Vaping Use: Never used  Substance and Sexual Activity   Alcohol use: Not Currently    Comment: occasional   Drug use: No   Sexual activity: Not on file  Other Topics Concern   Not on file  Social History Narrative   Not on file   Social Determinants  of Health   Financial Resource Strain: Low Risk  (08/13/2020)   Overall Financial Resource Strain (CARDIA)    Difficulty of Paying Living Expenses: Not hard at all  Food Insecurity: No Food Insecurity (08/13/2020)   Hunger Vital Sign    Worried About Running Out of Food in the Last Year: Never true    Ran Out of Food in the Last Year: Never true  Transportation Needs: No Transportation Needs (08/13/2020)   PRAPARE - Administrator, Civil Service (Medical): No    Lack of Transportation (Non-Medical): No  Physical Activity: Sufficiently Active (08/13/2020)   Exercise Vital Sign    Days of Exercise per Week: 6 days    Minutes of Exercise per Session: 60 min  Stress: No Stress Concern Present (08/13/2020)   [employer] of Occupational Health - Occupational Stress Questionnaire    Feeling of Stress : Not at all  Social Connections: Moderately Isolated (08/13/2020)   Social Connection and Isolation Panel [NHANES]    Frequency of Communication with Friends and Family:  More than three times a week    Frequency of Social Gatherings with Friends and Family: Once a week    Attends Religious Services: Never    Database administrator or Organizations: No    Attends Banker Meetings: Never    Marital Status: Married  Catering manager Violence: Not At Risk (08/13/2020)   Humiliation, Afraid, Rape, and Kick questionnaire    Fear of Current or Ex-Partner: No    Emotionally Abused: No    Physically Abused: No    Sexually Abused: No    Family History:   The patient's family history includes Arthritis in his mother; Bipolar disorder in his brother; Dementia in his brother and mother; Diabetes in his brother, maternal grandfather, maternal grandmother, mother, and sister; Heart disease in his father; Neuropathy in his brother; Prostate cancer in his father; Rheum arthritis in his mother.    ROS:  Please see the history of present illness.  All other ROS reviewed and negative.     Physical Exam/Data:   Vitals:   04/30/22 2342 04/30/22 2343 04/30/22 2352 04/30/22 2358  BP:  112/77 112/77 118/75  Pulse:  (!) 56 (!) 52 (!) 59  Resp:  20 (!) 24 14  Temp:   97.9 F (36.6 C)   TempSrc:  Oral Oral   SpO2:  100% 100% 100%  Weight: 77.1 kg     Height: 5\' 7"  (1.702 m)      No intake or output data in the 24 hours ending 05/01/22 0052    04/30/2022   11:42 PM 04/30/2022   10:18 AM 04/16/2022    2:12 PM  Last 3 Weights  Weight (lbs) 170 lb 174 lb 174 lb  Weight (kg) 77.111 kg 78.926 kg 78.926 kg     Body mass index is 26.63 kg/m.  General: Mild distress.  Appears uncomfortable HEENT: normal Neck: no JVD Vascular: No carotid bruits; Distal pulses 2+ bilaterally   Cardiac:  normal S1, S2; RRR; no murmur  Lungs:  clear to auscultation bilaterally, no wheezing, rhonchi or rales  Abd: soft, nontender, no hepatomegaly  Ext: Coarse breath sounds Musculoskeletal:  No deformities, BUE and BLE strength normal and equal Skin: warm and dry  Neuro:  CNs  2-12 intact, no focal abnormalities noted Psych:  Normal affect    EKG:  The ECG that was done  was personally reviewed and demonstrates ST elevation in leads II,  III and aVF, V4, V5 with reciprocal depressions in V1 and V2.  Relevant CV Studies: Echo in 09/2020: Normal EF estimated to be 60 to 65% normal RV.  No valvular dysfunction.  Laboratory Data:  High Sensitivity Troponin:   Recent Labs  Lab 04/30/22 2346  TROPONINIHS 5      Chemistry Recent Labs  Lab 04/30/22 2346  NA 139  K 3.0*  CL 105  CO2 23  GLUCOSE 125*  BUN 16  CREATININE 1.43*  CALCIUM 9.2  GFRNONAA 51*  ANIONGAP 11    No results for input(s): "PROT", "ALBUMIN", "AST", "ALT", "ALKPHOS", "BILITOT" in the last 168 hours. Lipids No results for input(s): "CHOL", "TRIG", "HDL", "LABVLDL", "LDLCALC", "CHOLHDL" in the last 168 hours. Hematology Recent Labs  Lab 04/30/22 2346  WBC 10.8*  RBC 4.60  HGB 13.8  HCT 41.7  MCV 90.7  MCH 30.0  MCHC 33.1  RDW 14.0  PLT 204   Thyroid No results for input(s): "TSH", "FREET4" in the last 168 hours. BNPNo results for input(s): "BNP", "PROBNP" in the last 168 hours.  DDimer No results for input(s): "DDIMER" in the last 168 hours.   Radiology/Studies:  DG Chest Portable 1 View  Result Date: 05/01/2022 CLINICAL DATA:  Chest pain. EXAM: PORTABLE CHEST 1 VIEW COMPARISON:  10/21/2020 FINDINGS: Stable heart size and mediastinal contours. Diffuse peribronchial thickening is new from prior. May be a slight right pleural effusion. No confluent consolidation. No pneumothorax. IMPRESSION: Diffuse peribronchial thickening is new from prior exam and may be pulmonary edema or bronchitis. Possible right pleural effusion. Electronically Signed   By: Narda RutherfordMelanie  Sanford M.D.   On: 05/01/2022 00:06     Assessment and Plan:    # STEMI Acute ST elevation with inferior pattern.  Taken emergently to Cath Lab.  Found to have a 100% occluded distal RCA, which was successfully intervened  upon by Dr. Sanjuana KavaMcAlhaney. Loaded with Plavix 600 mg for DAPT with aspirin in the lab. 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      Severity of Illness: The appropriate patient status for this patient is INPATIENT. Inpatient status is judged to be reasonable and necessary in order to provide the required intensity of service to ensure the patient's safety. The patient's presenting symptoms, physical exam findings, and initial radiographic and laboratory data in the context of their chronic comorbidities is felt to place them at high risk for further clinical deterioration. Furthermore, it is not anticipated that the patient will be medically stable for discharge from the hospital within 2 midnights of admission.   * I certify that at the point of admission it is my clinical judgment that the patient will require inpatient hospital care spanning beyond 2 midnights from the point of admission due  to high intensity of service, high risk for further deterioration and high frequency of surveillance required.*   For questions or updates, please contact [facility] Please consult [email or web address] for contact info under     Signed, Joellen Jersey, MD  05/01/2022 12:52 AM   I have personally seen and examined this patient. I agree with the  assessment and plan as outlined above.  76 yo male with history of HTN, HLD, AF admitted with an acute inferior STEMI. Chest pain started around 10:30 pm. He presented to the [facility] ED and a code STEMI was called. Severe chest pain and hypotension on arrival to [facility].  Cardiac cath with thrombotic occlusion of the distal RCA. Severe mid LAD stenosis.  A drug eluting stent was placed in the distal RCA.  Chest pain resolved post PCI.  Will admit to the ICU. Will start ASA 81 mg daily, high intensity statin. Will continue Plavix. Aggrastat infusion for 2 hours. Echo later today. He will need staged PCI of the LAD on Friday. Will hold Eliquis until after the staged PCI. I would plan 1 month of triple therapy with ASA/Plavix/Eliquis and then stop ASA in one month.   Verne Carrow, MD 05/01/2022 1:29 AM

## 2022-05-01 NOTE — Progress Notes (Signed)
  Echocardiogram 2D Echocardiogram has been performed.  Roosvelt Maser F 05/01/2022, 10:36 AM

## 2022-05-01 NOTE — Progress Notes (Signed)
ANTICOAGULATION CONSULT NOTE - Initial Consult  Pharmacy Consult for heparin Indication: atrial fibrillation and CAD awaiting staged PCI  Allergies  Allergen Reactions   Shellfish-Derived Products Other (See Comments)    Gout    Warfarin Sodium Rash    Patient Measurements: Height: 5\' 7"  (170.2 cm) Weight: 84.3 kg (185 lb 13.6 oz) IBW/kg (Calculated) : 66.1  Vital Signs: Temp: 97.6 F (36.4 C) (08/03 1128) Temp Source: Oral (08/03 1128) BP: 126/74 (08/03 1400) Pulse Rate: 70 (08/03 1400)  Labs: Recent Labs    04/30/22 2346 05/01/22 0209 05/01/22 0552 05/01/22 0951 05/01/22 1414  HGB 13.8 12.4* 11.8*  --   --   HCT 41.7 36.7* 35.1*  --   --   PLT 204 179 174  --   --   APTT  --   --   --   --  62*  LABPROT  --  17.7*  --   --   --   INR  --  1.5*  --   --   --   HEPARINUNFRC  --   --   --   --  >1.10*  CREATININE 1.43* 1.28* 1.10  --   --   TROPONINIHS 5 1,600* 10,835* 12,483*  --      Estimated Creatinine Clearance: 61.2 mL/min (by C-G formula based on SCr of 1.1 mg/dL).   Medical History: Past Medical History:  Diagnosis Date   Arthritis    WRIST   Benign localized prostatic hyperplasia with lower urinary tract symptoms (LUTS)    Complication of anesthesia    slow to wake   ED (erectile dysfunction)    GERD (gastroesophageal reflux disease)    History of concussion    AS CHILD--  NO RESIDUAL   History of gout    History of kidney stones    Hyperlipidemia    Inguinal hernia, bilateral    Nephrolithiasis    BILATERAL   PAF (paroxysmal atrial fibrillation) (HCC) currently being followed by pcp   EPISODE --  2011  FOLLOW-UP W/ DR WALL  /  HAS NOT SEEN ANY CARDIOLOGIST SINCE    Medications:  Medications Prior to Admission  Medication Sig Dispense Refill Last Dose   acetaminophen (TYLENOL) 500 MG tablet Take 1,000 mg by mouth every 6 (six) hours as needed for moderate pain.   04/30/2022   allopurinol (ZYLOPRIM) 100 MG tablet Take 1 tablet (100 mg  total) by mouth daily. 90 tablet 1 04/30/2022   apixaban (ELIQUIS) 5 MG TABS tablet Take 1 tablet (5 mg total) by mouth 2 (two) times daily. 180 tablet 1 04/30/2022 at 1700   Cimetidine (TAGAMET PO) Take 1 tablet by mouth daily as needed (for reflux).   04/30/2022   Colchicine 0.6 MG CAPS Take 0.6 mg by mouth daily as needed (for gout flares). 30 capsule 1 Past Month   ibuprofen (ADVIL) 200 MG tablet Take 200 mg by mouth every 6 (six) hours as needed for mild pain.   04/30/2022   metoprolol succinate (TOPROL-XL) 25 MG 24 hr tablet TAKE ONE TABLET BY MOUTH DAILY **STOP DILTIAZEM** (Patient taking differently: Take 25 mg by mouth daily.) 90 tablet 3 04/30/2022 at 1700   Multiple Vitamin (MULTIVITAMIN WITH MINERALS) TABS Take 1 tablet by mouth daily.   Past Week   Polyethylene Glycol 400 (BLINK TEARS) 0.25 % SOLN Place 1 drop into both eyes 2 (two) times daily as needed (for dryness or irritation).   04/30/2022   rosuvastatin (CRESTOR) 20 MG  tablet TAKE ONE TABLET BY MOUTH DAILY (Patient taking differently: Take 20 mg by mouth daily.) 90 tablet 3 04/30/2022   sodium chloride (OCEAN) 0.65 % SOLN nasal spray Place 1 spray into both nostrils as needed for congestion.   Past Week   tadalafil (CIALIS) 5 MG tablet Take 5 mg by mouth daily as needed for erectile dysfunction.   Past Week   trolamine salicylate (ASPERCREME) 10 % cream Apply 1 Application topically as needed for muscle pain (Heel).   04/30/2022   Scheduled:   allopurinol  100 mg Oral Daily   [START ON 05/02/2022] aspirin  81 mg Oral Pre-Cath   [START ON 05/02/2022] aspirin EC  81 mg Oral Daily   Chlorhexidine Gluconate Cloth  6 each Topical Daily   [START ON 05/02/2022] clopidogrel  75 mg Oral Daily   rosuvastatin  40 mg Oral Daily   sodium chloride flush  3 mL Intravenous Q12H   sodium chloride flush  3 mL Intravenous Q12H   Infusions:   sodium chloride     sodium chloride     [START ON 05/02/2022] sodium chloride     heparin 1,000 Units/hr (05/01/22 1300)     Assessment: 76yo male c/o acute onset of CP radiating to LUE and associated w/ N/V >> [facility]'d [facility] to [facility] for emergent cardiac cath for STEMI, now s/p DES to distal RCA and awaiting staged PCI of the LAD, to begin heparin.  Pt is on Eliquis PTA for Afib with last dose taken 8/2 at 5p. PT received aggrastat briefly after cath. Heparin level does not correlate with aPTT with recent DOAC. aPTT is subtherapeutic at 62 on heparin 1000 units/hr. Will slightly increase heparin rate for the remainder of its duration. CBC stable. No bleeding noted.  Goal of Therapy:  Heparin level 0.3-0.7 units/ml aPTT 66-102 seconds Monitor platelets by anticoagulation protocol: Yes   Plan:  Increase heparin infusion to 1100 units/hr. F/u plans for coag after cath  Thank you for allowing pharmacy to participate in this patient's care.  Enos Fling, PharmD PGY2 Pharmacy Resident 05/01/2022 3:05 PM Check [email or web address] for unit specific pharmacy number

## 2022-05-01 NOTE — Progress Notes (Signed)
Made Dr. Lynnette Caffey aware of Troponin of 12,483. MD acknowledged and no new orders given.

## 2022-05-01 NOTE — Progress Notes (Addendum)
ANTICOAGULATION CONSULT NOTE - Initial Consult  Pharmacy Consult for heparin Indication: atrial fibrillation and CAD awaiting staged PCI  Allergies  Allergen Reactions   Shellfish-Derived Products Other (See Comments)    Gout    Warfarin Sodium Rash    Patient Measurements: Height: 5\' 7"  (170.2 cm) Weight: 84.3 kg (185 lb 13.6 oz) IBW/kg (Calculated) : 66.1  Vital Signs: Temp: 98.1 F (36.7 C) (08/03 0143) Temp Source: Oral (08/03 0143) BP: 97/66 (08/03 0200) Pulse Rate: 92 (08/03 0200)  Labs: Recent Labs    04/30/22 2346 05/01/22 0209  HGB 13.8 12.4*  HCT 41.7 36.7*  PLT 204 179  CREATININE 1.43*  --   TROPONINIHS 5  --     Estimated Creatinine Clearance: 47.1 mL/min (A) (by C-G formula based on SCr of 1.43 mg/dL (H)).   Medical History: Past Medical History:  Diagnosis Date   Arthritis    WRIST   Benign localized prostatic hyperplasia with lower urinary tract symptoms (LUTS)    Complication of anesthesia    slow to wake   ED (erectile dysfunction)    GERD (gastroesophageal reflux disease)    History of concussion    AS CHILD--  NO RESIDUAL   History of gout    History of kidney stones    Hyperlipidemia    Inguinal hernia, bilateral    Nephrolithiasis    BILATERAL   PAF (paroxysmal atrial fibrillation) (HCC) currently being followed by pcp   EPISODE --  2011  FOLLOW-UP W/ DR WALL  /  HAS NOT SEEN ANY CARDIOLOGIST SINCE    Medications:  Medications Prior to Admission  Medication Sig Dispense Refill Last Dose   allopurinol (ZYLOPRIM) 100 MG tablet Take 1 tablet (100 mg total) by mouth daily. 90 tablet 1    apixaban (ELIQUIS) 5 MG TABS tablet Take 1 tablet (5 mg total) by mouth 2 (two) times daily. 180 tablet 1    Cimetidine (TAGAMET PO) Take 1 tablet by mouth daily as needed (for reflux).      Colchicine 0.6 MG CAPS Take 0.6 mg by mouth daily as needed (for gout flares). 30 capsule 1    metoprolol succinate (TOPROL-XL) 25 MG 24 hr tablet TAKE ONE  TABLET BY MOUTH DAILY **STOP DILTIAZEM** 90 tablet 3    Multiple Vitamin (MULTIVITAMIN WITH MINERALS) TABS Take 1 tablet by mouth daily.      Polyethylene Glycol 400 (BLINK TEARS) 0.25 % SOLN Place 1 drop into both eyes 2 (two) times daily as needed (for dryness or irritation).      rosuvastatin (CRESTOR) 20 MG tablet TAKE ONE TABLET BY MOUTH DAILY 90 tablet 3    tadalafil (CIALIS) 5 MG tablet Take 5 mg by mouth daily as needed for erectile dysfunction.      Scheduled:   allopurinol  100 mg Oral Daily   [START ON 05/02/2022] aspirin EC  81 mg Oral Daily   [START ON 05/02/2022] clopidogrel  75 mg Oral Daily   rosuvastatin  40 mg Oral Daily   sodium chloride flush  3 mL Intravenous Q12H   Infusions:   sodium chloride 50 mL/hr at 05/01/22 0248   sodium chloride     tirofiban 0.15 mcg/kg/min (05/01/22 0100)    Assessment: 76yo male c/o acute onset of CP radiating to LUE and associated w/ N/V >> tx'd from [facility] to [facility] for emergent cardiac cath for STEMI, now s/p DES to distal RCA and awaiting staged PCI of the LAD, to begin heparin.  Pt  is on Eliquis PTA for Afib with last dose taken 8/2 at 5p; rec'd heparin 4000 units at [facility] then 8000 units during cath, Aggrastat to run until ~3a, d/w Dr Hyacinth Meeker who requests heparin bridge to start 4-6h after sheath removal (0107).  Goal of Therapy:  Heparin level 0.3-0.7 units/ml aPTT 66-102 seconds Monitor platelets by anticoagulation protocol: Yes   Plan:  At 0630 will start heparin infusion at 1200 units/hr. Monitor heparin levels, aPTT (while Eliquis affects anti-Xa assay), and CBC.  Vernard Gambles, PharmD, BCPS  05/01/2022,2:49 AM

## 2022-05-01 NOTE — Progress Notes (Addendum)
Patient doing well after PCI; has residual LAD disease with plan for PCI tomorrow.  On hep gtt for AF.  Follow up TTE today.  No BB right now given inferior infarction.  NPO at [facility] for PCI tomorrow.

## 2022-05-02 ENCOUNTER — Other Ambulatory Visit (HOSPITAL_COMMUNITY): Payer: Self-pay

## 2022-05-02 ENCOUNTER — Inpatient Hospital Stay (HOSPITAL_COMMUNITY)
Admission: EM | Disposition: A | Discharge status: Home / Self Care | Payer: Self-pay | Source: Home / Self Care | Attending: Cardiovascular Disease

## 2022-05-02 ENCOUNTER — Encounter (HOSPITAL_COMMUNITY): Payer: Self-pay | Admitting: Cardiovascular Disease

## 2022-05-02 DIAGNOSIS — I25110 Atherosclerotic heart disease of native coronary artery with unstable angina pectoris: Secondary | ICD-10-CM | POA: Diagnosis not present

## 2022-05-02 HISTORY — PX: CORONARY STENT INTERVENTION: CATH118234

## 2022-05-02 LAB — CBC
HCT: 40.2 % (ref 39.0–52.0)
Hemoglobin: 13.1 g/dL (ref 13.0–17.0)
MCH: 30 pg (ref 26.0–34.0)
MCHC: 32.6 g/dL (ref 30.0–36.0)
MCV: 92 fL (ref 80.0–100.0)
Platelets: 171 10*3/uL (ref 150–400)
RBC: 4.37 MIL/uL (ref 4.22–5.81)
RDW: 14.1 % (ref 11.5–15.5)
WBC: 10.3 10*3/uL (ref 4.0–10.5)
nRBC: 0 % (ref 0.0–0.2)

## 2022-05-02 LAB — POCT ACTIVATED CLOTTING TIME: Activated Clotting Time: 311 seconds

## 2022-05-02 LAB — BASIC METABOLIC PANEL
Anion gap: 8 (ref 5–15)
BUN: 6 mg/dL — ABNORMAL LOW (ref 8–23)
CO2: 25 mmol/L (ref 22–32)
Calcium: 9.3 mg/dL (ref 8.9–10.3)
Chloride: 108 mmol/L (ref 98–111)
Creatinine, Ser: 0.88 mg/dL (ref 0.61–1.24)
GFR, Estimated: >60 mL/min (ref >60)
Glucose, Bld: 103 mg/dL — ABNORMAL HIGH (ref 70–99)
Potassium: 3.9 mmol/L (ref 3.5–5.1)
Sodium: 141 mmol/L (ref 135–145)

## 2022-05-02 LAB — LIPOPROTEIN A (LPA): Lipoprotein (a): 16.7 nmol/L (ref <75.0)

## 2022-05-02 SURGERY — CORONARY STENT INTERVENTION
Anesthesia: LOCAL

## 2022-05-02 MED ORDER — HEPARIN SODIUM (PORCINE) 1000 UNIT/ML IJ SOLN
INTRAMUSCULAR | Status: DC | PRN
  Administered 2022-05-02: 9000 [IU] via INTRAVENOUS

## 2022-05-02 MED ORDER — MIDAZOLAM HCL 2 MG/2ML IJ SOLN
INTRAMUSCULAR | Status: DC | PRN
  Administered 2022-05-02: 1 mg via INTRAVENOUS

## 2022-05-02 MED ORDER — NITROGLYCERIN 1 MG/10 ML FOR IR/CATH LAB
INTRA_ARTERIAL | Status: AC
  Filled 2022-05-02: qty 10

## 2022-05-02 MED ORDER — LIDOCAINE HCL (PF) 1 % IJ SOLN
INTRAMUSCULAR | Status: AC
  Filled 2022-05-02: qty 30

## 2022-05-02 MED ORDER — HYDRALAZINE HCL 20 MG/ML IJ SOLN: 10.0000 mg | INTRAMUSCULAR | Status: AC | PRN

## 2022-05-02 MED ORDER — HEPARIN (PORCINE) IN NACL 1000-0.9 UT/500ML-% IV SOLN
INTRAVENOUS | Status: AC
  Filled 2022-05-02: qty 1000

## 2022-05-02 MED ORDER — METOPROLOL TARTRATE 25 MG PO TABS
25.0000 mg | ORAL_TABLET | Freq: Two times a day (BID) | ORAL | Status: DC
Start: 2022-05-02 — End: 2022-05-03
  Administered 2022-05-02 – 2022-05-03 (×3): 25 mg via ORAL
  Filled 2022-05-02 (×3): qty 1

## 2022-05-02 MED ORDER — VERAPAMIL HCL 2.5 MG/ML IV SOLN
INTRAVENOUS | Status: AC
  Filled 2022-05-02: qty 2

## 2022-05-02 MED ORDER — APIXABAN 5 MG PO TABS
5.0000 mg | ORAL_TABLET | Freq: Two times a day (BID) | ORAL | Status: DC
  Administered 2022-05-02 – 2022-05-03 (×2): 5 mg via ORAL
  Filled 2022-05-02 (×2): qty 1

## 2022-05-02 MED ORDER — MIDAZOLAM HCL 2 MG/2ML IJ SOLN
INTRAMUSCULAR | Status: AC
  Filled 2022-05-02: qty 2

## 2022-05-02 MED ORDER — SODIUM CHLORIDE 0.9 % IV SOLN: 250.0000 mL | INTRAVENOUS | Status: DC | PRN

## 2022-05-02 MED ORDER — SODIUM CHLORIDE 0.9 % IV SOLN: INTRAVENOUS | Status: AC

## 2022-05-02 MED ORDER — HEPARIN SODIUM (PORCINE) 1000 UNIT/ML IJ SOLN
INTRAMUSCULAR | Status: AC
  Filled 2022-05-02: qty 10

## 2022-05-02 MED ORDER — SODIUM CHLORIDE 0.9% FLUSH
3.0000 mL | Freq: Two times a day (BID) | INTRAVENOUS | Status: DC
  Administered 2022-05-02: 3 mL via INTRAVENOUS

## 2022-05-02 MED ORDER — FENTANYL CITRATE (PF) 100 MCG/2ML IJ SOLN
INTRAMUSCULAR | Status: AC
  Filled 2022-05-02: qty 2

## 2022-05-02 MED ORDER — SODIUM CHLORIDE 0.9% FLUSH: 3.0000 mL | INTRAVENOUS | Status: DC | PRN

## 2022-05-02 MED ORDER — IOHEXOL 350 MG/ML SOLN
INTRAVENOUS | Status: DC | PRN
  Administered 2022-05-02: 70 mL

## 2022-05-02 MED ORDER — LIDOCAINE HCL (PF) 1 % IJ SOLN
INTRAMUSCULAR | Status: DC | PRN
  Administered 2022-05-02: 2 mL

## 2022-05-02 MED ORDER — LABETALOL HCL 5 MG/ML IV SOLN: 10.0000 mg | INTRAVENOUS | Status: AC | PRN

## 2022-05-02 MED ORDER — HEPARIN (PORCINE) IN NACL 1000-0.9 UT/500ML-% IV SOLN
INTRAVENOUS | Status: DC | PRN
  Administered 2022-05-02 (×2): 500 mL

## 2022-05-02 MED ORDER — VERAPAMIL HCL 2.5 MG/ML IV SOLN
INTRAVENOUS | Status: DC | PRN
  Administered 2022-05-02: 10 mL via INTRA_ARTERIAL

## 2022-05-02 MED ORDER — FENTANYL CITRATE (PF) 100 MCG/2ML IJ SOLN
INTRAMUSCULAR | Status: DC | PRN
  Administered 2022-05-02: 25 ug via INTRAVENOUS
  Administered 2022-05-02: 50 ug via INTRAVENOUS

## 2022-05-02 SURGICAL SUPPLY — 20 items
BALL SAPPHIRE NC24 2.75X15 (BALLOONS) ×2
BALL SAPPHIRE NC24 3.0X15 (BALLOONS) ×2
BALLN SAPPHIRE 2.0X12 (BALLOONS) ×2
BALLOON SAPPHIRE 2.0X12 (BALLOONS) IMPLANT
BALLOON SAPPHIRE NC24 2.75X15 (BALLOONS) IMPLANT
BALLOON SAPPHIRE NC24 3.0X15 (BALLOONS) IMPLANT
BAND CMPR LRG ZPHR (HEMOSTASIS) ×1
BAND ZEPHYR COMPRESS 30 LONG (HEMOSTASIS) ×1 IMPLANT
CATH VISTA GUIDE 6FR XBLAD3.0 (CATHETERS) ×1 IMPLANT
GLIDESHEATH SLEND SS 6F .021 (SHEATH) ×1 IMPLANT
GUIDEWIRE INQWIRE 1.5J.035X260 (WIRE) IMPLANT
INQWIRE 1.5J .035X260CM (WIRE) ×2
KIT ENCORE 26 ADVANTAGE (KITS) ×1 IMPLANT
KIT HEART LEFT (KITS) ×2 IMPLANT
PACK CARDIAC CATHETERIZATION (CUSTOM PROCEDURE TRAY) ×2 IMPLANT
STENT SYNERGY XD 2.50X38 (Permanent Stent) IMPLANT
SYNERGY XD 2.50X38 (Permanent Stent) ×2 IMPLANT
TRANSDUCER W/STOPCOCK (MISCELLANEOUS) ×2 IMPLANT
TUBING CIL FLEX 10 FLL-RA (TUBING) ×2 IMPLANT
WIRE COUGAR XT STRL 190CM (WIRE) ×1 IMPLANT

## 2022-05-02 NOTE — Care Management (Signed)
  Transition of Care [facility]) Screening Note   Patient Details  Name: [name] Short Date of Birth: November 01, 1945   Transition of Care [facility]) CM/SW Contact:    Gala Lewandowsky, RN Phone Number: 05/02/2022, 4:16 PM    Transition of Care Department [facility]) has reviewed the patient and no TOC needs have been identified at this time. Case Manager will continue to monitor patient advancement through interdisciplinary progression rounds. If new patient transition needs arise, please place a TOC consult.

## 2022-05-02 NOTE — Interval H&P Note (Signed)
History and Physical Interval Note:  05/02/2022 7:19 AM  [name] Short  has presented today for surgery, with the diagnosis of cad.  The various methods of treatment have been discussed with the patient and family. After consideration of risks, benefits and other options for treatment, the patient has consented to  Procedure(s): CORONARY STENT INTERVENTION (N/A) as a surgical intervention.  The patient's history has been reviewed, patient examined, no change in status, stable for surgery.  I have reviewed the patient's chart and labs.  Questions were answered to the patient's satisfaction.    Cath Lab Visit (complete for each Cath Lab visit)  Clinical Evaluation Leading to the Procedure:   ACS: Yes.    Non-ACS:    Anginal Classification: CCS III  Anti-ischemic medical therapy: No Therapy  Non-Invasive Test Results: No non-invasive testing performed. Staged PCI of severe LAD stenosis  Prior CABG: No previous CABG        Verne Carrow

## 2022-05-02 NOTE — Progress Notes (Signed)
Pt admitted to 6E AxOx4, VS wnL and as per flow. Pt oriented to 6E processes. Pt familiar with the [facility] system. All questions and concerns addressed. Family bedside. (R) radial cath site C/D/I. Call bell placed within reach, will continue to monitor and maintain safety.

## 2022-05-02 NOTE — TOC Benefit Eligibility Note (Signed)
Patient Scientific laboratory technician completed.     The patient is currently admitted and upon discharge could be taking FARXIGA.   The current 30 day co-pay is, $20.   The patient is currently admitted and upon discharge could be taking JARDIANCE.   The current 30 day co-pay is, $20.  The patient is insured through [employer].

## 2022-05-02 NOTE — Progress Notes (Signed)
CARDIAC REHAB PHASE I     Pt resting in bed no CP or SOB post cath today.Site care reviewed .  Reinforced teaching no questions or concerns today. Hopeful for discharge tomorrow. CRP2 referral sent to [facility]. Will continue to follow.   1330-1400  Woodroe Chen, RN BSN 05/02/2022 2:08 PM

## 2022-05-03 DIAGNOSIS — I2111 ST elevation (STEMI) myocardial infarction involving right coronary artery: Secondary | ICD-10-CM | POA: Diagnosis not present

## 2022-05-03 LAB — CBC
HCT: 38.6 % — ABNORMAL LOW (ref 39.0–52.0)
Hemoglobin: 12.8 g/dL — ABNORMAL LOW (ref 13.0–17.0)
MCH: 30.1 pg (ref 26.0–34.0)
MCHC: 33.2 g/dL (ref 30.0–36.0)
MCV: 90.8 fL (ref 80.0–100.0)
Platelets: 155 10*3/uL (ref 150–400)
RBC: 4.25 MIL/uL (ref 4.22–5.81)
RDW: 14.2 % (ref 11.5–15.5)
WBC: 11.3 10*3/uL — ABNORMAL HIGH (ref 4.0–10.5)
nRBC: 0 % (ref 0.0–0.2)

## 2022-05-03 LAB — BASIC METABOLIC PANEL
Anion gap: 7 (ref 5–15)
BUN: 8 mg/dL (ref 8–23)
CO2: 23 mmol/L (ref 22–32)
Calcium: 8.7 mg/dL — ABNORMAL LOW (ref 8.9–10.3)
Chloride: 105 mmol/L (ref 98–111)
Creatinine, Ser: 0.96 mg/dL (ref 0.61–1.24)
GFR, Estimated: >60 mL/min (ref >60)
Glucose, Bld: 114 mg/dL — ABNORMAL HIGH (ref 70–99)
Potassium: 3.7 mmol/L (ref 3.5–5.1)
Sodium: 135 mmol/L (ref 135–145)

## 2022-05-03 MED ORDER — ROSUVASTATIN CALCIUM 40 MG PO TABS: 40.0000 mg | ORAL_TABLET | Freq: Every day | ORAL | 0 refills | Status: DC

## 2022-05-03 MED ORDER — COLCHICINE 0.6 MG PO TABS
0.6000 mg | ORAL_TABLET | Freq: Once | ORAL | Status: AC
  Administered 2022-05-03: 0.6 mg via ORAL
  Filled 2022-05-03: qty 1

## 2022-05-03 MED ORDER — CLOPIDOGREL BISULFATE 75 MG PO TABS: 75.0000 mg | ORAL_TABLET | Freq: Every day | ORAL | 2 refills | Status: DC

## 2022-05-03 MED ORDER — METOPROLOL TARTRATE 25 MG PO TABS: 25.0000 mg | ORAL_TABLET | Freq: Two times a day (BID) | ORAL | 0 refills | Status: DC

## 2022-05-03 MED ORDER — ASPIRIN 81 MG PO TBEC: 81.0000 mg | DELAYED_RELEASE_TABLET | Freq: Every day | ORAL | 0 refills | Status: DC

## 2022-05-03 MED ORDER — NITROGLYCERIN 0.4 MG SL SUBL: 0.4000 mg | SUBLINGUAL_TABLET | SUBLINGUAL | 2 refills | Status: DC | PRN

## 2022-05-03 NOTE — Progress Notes (Signed)
Discharge instructions (including medications) discussed with and copy provided to patient/caregiver  All belonging at bedside taken home by pt's wife [place].   Pt is ready for discharge just waiting on provider clarification on prescription for Protonix. Message sent to primary RN and provider Laverda Page, NP.

## 2022-05-03 NOTE — Progress Notes (Signed)
Patient converted to Sinus Rhythm

## 2022-05-03 NOTE — Progress Notes (Signed)
CARDIAC REHAB PHASE I   PRE:  Rate/Rhythm: 85 SR  BP:  Supine: 117/66     SaO2: 95 RA  Pt is having gout flare up, no walking with me. Reviewed education and radial restrictions with pt and family. Talked more about outpatient CRP2 in [place] after discharge. Pt left in the bed with call bell in reach. All questions were answered and pt verbalized understanding.  [id number] [name] Short ACSM-CEP 05/03/2022 9:04 AM

## 2022-05-03 NOTE — Progress Notes (Signed)
Progress Note  Patient Name: [name] Short Date of Encounter: 05/03/2022  [facility] Cardiologist: Little Ishikawa, MD   Subjective   No CP or dyspnea; complains of right ankle pain from tennis injury  Inpatient Medications    Scheduled Meds:  allopurinol  100 mg Oral Daily   apixaban  5 mg Oral BID   aspirin EC  81 mg Oral Daily   clopidogrel  75 mg Oral Daily   metoprolol tartrate  25 mg Oral BID   rosuvastatin  40 mg Oral Daily   sodium chloride flush  3 mL Intravenous Q12H   Continuous Infusions:  sodium chloride     sodium chloride     PRN Meds: sodium chloride, sodium chloride, acetaminophen, ondansetron (ZOFRAN) IV, sodium chloride flush   Vital Signs    Vitals:   05/02/22 1806 05/02/22 2030 05/02/22 2226 05/03/22 0626  BP: 116/73 129/73 112/87 103/65  Pulse: 96 100 100 85  Resp: 18 20    Temp: 98.6 F (37 C)   97.8 F (36.6 C)  TempSrc: Oral     SpO2: 93% 100%  100%  Weight:      Height:        Intake/Output Summary (Last 24 hours) at 05/03/2022 0800 Last data filed at 05/02/2022 1700 Gross per 24 hour  Intake 960 ml  Output 1100 ml  Net -140 ml      05/02/2022    5:00 AM 05/01/2022    1:43 AM 04/30/2022   11:42 PM  Last 3 Weights  Weight (lbs) 172 lb 12.8 oz 185 lb 13.6 oz 170 lb  Weight (kg) 78.382 kg 84.3 kg 77.111 kg      Telemetry    Atrial fibrillation converted to sinus - Personally Reviewed   Physical Exam   GEN: No acute distress.   Neck: No JVD Cardiac: RRR, no murmurs, rubs, or gallops.  Respiratory: Clear to auscultation bilaterally. GI: Soft, nontender, non-distended  MS: No edema; radial cath site with no hematoma.  Patient is noted to have 2+ dorsalis pedis and posterior tibial pulses on the right. Neuro:  Nonfocal  Psych: Normal affect   Labs    High Sensitivity Troponin:   Recent Labs  Lab 04/30/22 2346 05/01/22 0209 05/01/22 0552 05/01/22 0951 05/01/22 1414  TROPONINIHS 5 1,600* 10,835* 12,483*  9,120*     Chemistry Recent Labs  Lab 05/01/22 0209 05/01/22 0552 05/02/22 0629 05/03/22 0213  NA 140 139 141 135  K 3.5 4.1 3.9 3.7  CL 107 107 108 105  CO2 21* 23 25 23   GLUCOSE 134* 110* 103* 114*  BUN 15 13 6* 8  CREATININE 1.28* 1.10 0.88 0.96  CALCIUM 8.8* 8.6* 9.3 8.7*  PROT 6.2*  --   --   --   ALBUMIN 3.4*  --   --   --   AST 28  --   --   --   ALT 18  --   --   --   ALKPHOS 56  --   --   --   BILITOT 0.8  --   --   --   GFRNONAA 59* >60 >60 >60  ANIONGAP 12 9 8 7     Lipids  Recent Labs  Lab 05/01/22 0209  CHOL 104  TRIG 62  HDL 44  LDLCALC 48  CHOLHDL 2.4    Hematology Recent Labs  Lab 05/01/22 0552 05/02/22 0629 05/03/22 0213  WBC 10.0 10.3 11.3*  RBC 3.90*  4.37 4.25  HGB 11.8* 13.1 12.8*  HCT 35.1* 40.2 38.6*  MCV 90.0 92.0 90.8  MCH 30.3 30.0 30.1  MCHC 33.6 32.6 33.2  RDW 14.0 14.1 14.2  PLT 174 171 155   Thyroid  Recent Labs  Lab 05/01/22 0209  TSH 2.377  FREET4 1.13*    BNP Recent Labs  Lab 05/01/22 0209  BNP 64.5     Radiology    CARDIAC CATHETERIZATION  Result Date: 05/02/2022   Mid LAD-1 lesion is 95% stenosed.   Mid LAD-2 lesion is 80% stenosed.   Ost LM lesion is 30% stenosed.   A drug-eluting stent was successfully placed using a SYNERGY XD 2.50X38.   Post intervention, there is a 0% residual stenosis.   Post intervention, there is a 0% residual stenosis. Severe serial mid LAD stenoses Successful PTCA/DES x 1 mid LAD Recommendations: Continue DAPT with ASA/Plavix for one month. Since he will be on Eliquis, ok to stop ASA in one month. Resume Eliquis tomorrow morning.   ECHOCARDIOGRAM COMPLETE  Result Date: 05/01/2022    ECHOCARDIOGRAM REPORT   Patient Name:   [name] Short Date of Exam: 05/01/2022 Medical Rec #:  [id number]          Height:       67.0 in Accession #:    [id number]         Weight:       185.8 lb Date of Birth:  1945-11-27         BSA:          1.960 m Patient Age:    75 years           BP:            122/66 mmHg Patient Gender: M                  HR:           86 bpm. Exam Location:  Inpatient Procedure: 2D Echo, Cardiac Doppler and Color Doppler Indications:    Acute ischemic heart disease, unspecified I24.9  History:        Patient has prior history of Echocardiogram examinations, most                 recent 10/23/2021. CAD and Acute MI, Cardiac cath 05/01/22; Stroke.  Sonographer:    Roosvelt Maser RDCS Referring Phys: [id number] CHRISTOPHER D MCALHANY IMPRESSIONS  1. Left ventricular ejection fraction, by estimation, is 50 to 55%. The left ventricle has low normal function. The left ventricle has no regional wall motion abnormalities. Left ventricular diastolic parameters are indeterminate.  2. Right ventricular systolic function is normal. The right ventricular size is normal. There is mildly elevated pulmonary artery systolic pressure.  3. The mitral valve is normal in structure. Mild mitral valve regurgitation. No evidence of mitral stenosis.  4. The aortic valve is normal in structure. Aortic valve regurgitation is mild. No aortic stenosis is present.  5. The inferior vena cava is normal in size with greater than 50% respiratory variability, suggesting right atrial pressure of 3 mmHg. FINDINGS  Left Ventricle: Left ventricular ejection fraction, by estimation, is 50 to 55%. The left ventricle has low normal function. The left ventricle has no regional wall motion abnormalities. The left ventricular internal cavity size was normal in size. There is no left ventricular hypertrophy. Left ventricular diastolic parameters are indeterminate. Right Ventricle: The right ventricular size is normal. No increase in right ventricular wall thickness. Right ventricular systolic  function is normal. There is mildly elevated pulmonary artery systolic pressure. The tricuspid regurgitant velocity is 2.67  m/s, and with an assumed right atrial pressure of 8 mmHg, the estimated right ventricular systolic pressure is 36.5 mmHg. Left  Atrium: Left atrial size was normal in size. Right Atrium: Right atrial size was normal in size. Pericardium: There is no evidence of pericardial effusion. Mitral Valve: The mitral valve is normal in structure. Mild mitral valve regurgitation. No evidence of mitral valve stenosis. Tricuspid Valve: The tricuspid valve is normal in structure. Tricuspid valve regurgitation is mild . No evidence of tricuspid stenosis. Aortic Valve: The aortic valve is normal in structure. Aortic valve regurgitation is mild. No aortic stenosis is present. Aortic valve mean gradient measures 4.0 mmHg. Aortic valve peak gradient measures 7.5 mmHg. Aortic valve area, by VTI measures 2.81 cm. Pulmonic Valve: The pulmonic valve was normal in structure. Pulmonic valve regurgitation is not visualized. No evidence of pulmonic stenosis. Aorta: The aortic root is normal in size and structure. Venous: The inferior vena cava is normal in size with greater than 50% respiratory variability, suggesting right atrial pressure of 3 mmHg. IAS/Shunts: No atrial level shunt detected by color flow Doppler.  LEFT VENTRICLE PLAX 2D LVIDd:         4.40 cm   Diastology LVIDs:         3.20 cm   LV e' medial:    9.36 cm/s LV PW:         1.10 cm   LV E/e' medial:  9.9 LV IVS:        0.90 cm   LV e' lateral:   10.40 cm/s LVOT diam:     2.10 cm   LV E/e' lateral: 8.9 LV SV:         73 LV SV Index:   37 LVOT Area:     3.46 cm  RIGHT VENTRICLE             IVC RV Basal diam:  3.00 cm     IVC diam: 2.30 cm RV S prime:     12.80 cm/s TAPSE (M-mode): 2.3 cm LEFT ATRIUM             Index        RIGHT ATRIUM           Index LA diam:        4.10 cm 2.09 cm/m   RA Area:     15.20 cm LA Vol (A2C):   68.9 ml 35.15 ml/m  RA Volume:   35.10 ml  17.91 ml/m LA Vol (A4C):   45.1 ml 23.01 ml/m LA Biplane Vol: 58.8 ml 30.00 ml/m  AORTIC VALVE AV Area (Vmax):    2.88 cm AV Area (Vmean):   2.83 cm AV Area (VTI):     2.81 cm AV Vmax:           137.00 cm/s AV Vmean:           92.400 cm/s AV VTI:            0.260 m AV Peak Grad:      7.5 mmHg AV Mean Grad:      4.0 mmHg LVOT Vmax:         114.00 cm/s LVOT Vmean:        75.500 cm/s LVOT VTI:          0.211 m LVOT/AV VTI ratio: 0.81  AORTA Ao Root diam: 3.00 cm Ao Asc  diam:  3.30 cm MITRAL VALVE               TRICUSPID VALVE MV Area (PHT): 4.49 cm    TR Peak grad:   28.5 mmHg MV Decel Time: 169 msec    TR Vmax:        267.00 cm/s MV E velocity: 92.80 cm/s MV A velocity: 91.70 cm/s  SHUNTS MV E/A ratio:  1.01        Systemic VTI:  0.21 m                            Systemic Diam: 2.10 cm Kardie Tobb DO Electronically signed by Thomasene Ripple DO Signature Date/Time: 05/01/2022/12:52:00 PM    Final       Patient Profile     76 y.o. male with past medical history of atrial fibrillation, hypertension, hyperlipidemia admitted with acute inferior ST elevation myocardial infarction.  Cardiac catheterization revealed occluded distal right coronary artery, 95% mid LAD lesion followed by 80% lesion.  Patient had successful PCI of the right coronary artery on August 3.  Subsequently underwent staged PCI of the LAD on August 4.  Echocardiogram shows ejection fraction 50 to 55%, mild mitral regurgitation, mild aortic insufficiency.  Assessment & Plan    1 status post acute inferior ST elevation myocardial infarction-patient is status post PCI of the right coronary artery with stage intervention to the LAD.  He is doing well with no chest pain.  Continue aspirin, Plavix, Crestor and metoprolol.  Note his LV function is preserved.  We will continue combination aspirin and Plavix for 30 days then discontinue aspirin at that time given need for apixaban.  2 paroxysmal atrial fibrillation-patient has converted to sinus rhythm.  Continue metoprolol for rate control if atrial fibrillation recurs.  Continue apixaban.  3 hypertension-patient's blood pressure is controlled.  Continue present medical regimen.  4 hyperlipidemia-continue statin.  5  mildly elevated free T4-would plan repeat laboratories as an outpatient.  Patient can be discharged today on present medications.  Follow-up with APP in 2 weeks.  Follow-up with Dr. Bjorn Pippin in 3 months. Greater than 30 minutes PA and physician time. D2  For questions or updates, please contact [facility] Please consult [email or web address] for contact info under        Signed, Olga Millers, MD  05/03/2022, 8:00 AM

## 2022-05-03 NOTE — Discharge Summary (Signed)
Discharge Summary    Patient ID: [name] Short MRN: [id number]; DOB: 05-25-1946  Admit date: 04/30/2022 Discharge date: 05/03/2022  PCP:  Sheliah Hatch, MD   [facility] Providers Cardiologist:  Little Ishikawa, MD  Electrophysiologist:  Lanier Prude, MD  {  Discharge Diagnoses    Principal Problem:   STEMI (ST elevation myocardial infarction) [facility]) Active Problems:   HYPERLIPIDEMIA TYPE I / IV   TOBACCO USER   Paroxysmal a-fib (HCC)   Acute ST elevation myocardial infarction (STEMI) of inferior wall [facility])   Coronary artery disease involving native coronary artery of native heart with unstable angina pectoris [facility])  Diagnostic Studies/Procedures    Cath: 05/01/22    Dist RCA lesion is 100% stenosed.   Mid LAD-1 lesion is 95% stenosed.   Mid LAD-2 lesion is 80% stenosed.   A drug-eluting stent was successfully placed using a SYNERGY XD 3.50X20.   Post intervention, there is a 0% residual stenosis.   Acute inferior STEMI secondary to thrombotic occlusion of the distal RCA.  Successful PTCA/DES x 1 distal RCA Severe stenoses in the mid LAD.  No obstructive disease in the Circumflex LVEDP 20 mmHg   Recommendations: Will admit to the ICU. Will start ASA 81 mg daily, high intensity statin. Will continue Plavix. Aggrastat infusion for 2 hours. Echo later today. He will need staged PCI of the LAD on Friday. Will hold Eliquis until after the staged PCI. I would plan 1 month of triple therapy with ASA/Plavix/Eliquis and then stop ASA in one month.   Diagnostic Dominance: Right  Intervention    Cath: 05/02/22    Mid LAD-1 lesion is 95% stenosed.   Mid LAD-2 lesion is 80% stenosed.   Ost LM lesion is 30% stenosed.   A drug-eluting stent was successfully placed using a SYNERGY XD 2.50X38.   Post intervention, there is a 0% residual stenosis.   Post intervention, there is a 0% residual stenosis.   Severe serial mid LAD stenoses Successful PTCA/DES x 1  mid LAD   Recommendations: Continue DAPT with ASA/Plavix for one month. Since he will be on Eliquis, ok to stop ASA in one month. Resume Eliquis tomorrow morning.    Diagnostic Dominance: Right  Intervention   _____________   History of Present Illness     [name] Short is a 76 y.o. male with atrial fibrillation, on Eliquis, hypertension, hyperlipidemia who was seen 05/01/2022 for the evaluation of chest pain.  He presented with acute onset chest pain and left arm pain.  He was also having significant nausea and vomiting.  He was playing tennis the day prior and noted that he was feeling bad.  Subsequently began having chest pain with worsening shortness of breath and transient diaphoresis.  Given that it was persisting, this prompted him to come to the emergency department for evaluation.   On presentation he was noted to have ST elevation in the inferior leads III and aVF.  Reciprocal changes in V1 and V2.  Also have ST elevations in V4 and V5.  On presentation he was hypotensive.  At the outside ED he was given aspirin 325 mg, heparin 4000 units, Zofran, and fentanyl.  He got an initial dose of nitroglycerin which worsened his hypertension.   Code STEMI was called and he was emergently transferred to [facility] for emergent cardiac catheterization with Dr. Clifton James.  Hospital Course     STEMI: Underwent cardiac catheterization noted above with acute inferior STEMI secondary to thrombotic  occlusion of distal LAD treated with PCI/DES x1.  Also noted to have severe stenosis in the mid LAD with planned staged intervention.  Started on DAPT with aspirin/Plavix.  Continued on Aggrastat infusion for 2 hours post initial PCI.  Underwent successful PTCA/DES x1 to mid LAD in staged fashion.  High-sensitivity troponin peaked at 12,483.  Plan for triple therapy for 1 month with aspirin, Plavix and Eliquis.  Then would stop aspirin after 1 month.  Patient was seen by cardiac rehab.  No recurrent  chest pain.  Follow-up echocardiogram showed LVEF of 50 to 55%, no regional wall motion abnormality, normal RV size and function, mildly elevated pulmonary artery pressures, no significant valvular disease. --Continue aspirin, Plavix, metoprolol 25 mg twice daily, Crestor 40 mg daily  Paroxymal Afib: Initially noted in atrial fibrillation, but converted to sinus rhythm. --Continue metoprolol 25 mg twice daily as well as Eliquis 5 mg twice daily  HTN: Well-controlled --Continue metoprolol 25 mg twice daily  HLD: LDL 48, HDL 44 --Continue Crestor 40 mg daily --Will need LFT/FLP in 8 weeks  Mildly elevated T4: follow up as an outpatient  Patient was seen by Dr. Jens Som and deemed stable for discharge home.  Follow-up arranged in the office.  Medication sent to patient's pharmacy of choice.  Did the patient have an acute coronary syndrome (MI, NSTEMI, STEMI, etc) this admission?:  Yes                               AHA/ACC Clinical Performance & Quality Measures: Aspirin prescribed? - Yes ADP Receptor Inhibitor (Plavix/Clopidogrel, Brilinta/Ticagrelor or Effient/Prasugrel) prescribed (includes medically managed patients)? - Yes Beta Blocker prescribed? - Yes High Intensity Statin (Lipitor 40-80mg  or Crestor 20-40mg ) prescribed? - Yes EF assessed during THIS hospitalization? - Yes For EF <40%, was ACEI/ARB prescribed? - Not Applicable (EF >/= 40%) For EF <40%, Aldosterone Antagonist (Spironolactone or Eplerenone) prescribed? - Not Applicable (EF >/= 40%) Cardiac Rehab Phase II ordered (including medically managed patients)? - Yes   The patient will be scheduled for a TOC follow up appointment in 10-14 days.  A message has been sent to the [facility] and Scheduling Pool at the office where the patient should be seen for follow up.  _____________  Discharge Vitals Blood pressure 103/65, pulse 85, temperature 97.8 F (36.6 C), resp. rate 20, height  (1.702 m), weight 78.4 kg, SpO2 100 %.   Filed Weights   04/30/22 2342 05/01/22 0143 05/02/22 0500  Weight: 77.1 kg 84.3 kg 78.4 kg    Labs & Radiologic Studies    CBC Recent Labs    04/30/22 2346 05/01/22 0209 05/02/22 0629 05/03/22 0213  WBC 10.8*   < > 10.3 11.3*  NEUTROABS 6.5  --   --   --   HGB 13.8   < > 13.1 12.8*  HCT 41.7   < > 40.2 38.6*  MCV 90.7   < > 92.0 90.8  PLT 204   < > 171 155   < > = values in this interval not displayed.   Basic Metabolic Panel Recent Labs    16/10/96 0629 05/03/22 0213  NA 141 135  K 3.9 3.7  CL 108 105  CO2 25 23  GLUCOSE 103* 114*  BUN 6* 8  CREATININE 0.88 0.96  CALCIUM 9.3 8.7*   Liver Function Tests Recent Labs    05/01/22 0209  AST 28  ALT 18  ALKPHOS  56  BILITOT 0.8  PROT 6.2*  ALBUMIN 3.4*   No results for input(s): "LIPASE", "AMYLASE" in the last 72 hours. High Sensitivity Troponin:   Recent Labs  Lab 04/30/22 2346 05/01/22 0209 05/01/22 0552 05/01/22 0951 05/01/22 1414  TROPONINIHS 5 1,600* 10,835* 12,483* 9,120*    BNP Invalid input(s): "POCBNP" D-Dimer No results for input(s): "DDIMER" in the last 72 hours. Hemoglobin A1C Recent Labs    05/01/22 0209  HGBA1C 5.6   Fasting Lipid Panel Recent Labs    05/01/22 0209  CHOL 104  HDL 44  LDLCALC 48  TRIG 62  CHOLHDL 2.4   Thyroid Function Tests Recent Labs    05/01/22 0209  TSH 2.377   _____________  CARDIAC CATHETERIZATION  Result Date: 05/02/2022   Mid LAD-1 lesion is 95% stenosed.   Mid LAD-2 lesion is 80% stenosed.   Ost LM lesion is 30% stenosed.   A drug-eluting stent was successfully placed using a SYNERGY XD 2.50X38.   Post intervention, there is a 0% residual stenosis.   Post intervention, there is a 0% residual stenosis. Severe serial mid LAD stenoses Successful PTCA/DES x 1 mid LAD Recommendations: Continue DAPT with ASA/Plavix for one month. Since he will be on Eliquis, ok to stop ASA in one month. Resume Eliquis tomorrow morning.   ECHOCARDIOGRAM  COMPLETE  Result Date: 05/01/2022    ECHOCARDIOGRAM REPORT   Patient Name:   [name] Short Date of Exam: 05/01/2022 Medical Rec #:  [id number]          Height:       67.0 in Accession #:    [id number]         Weight:       185.8 lb Date of Birth:  [birth year]         BSA:          1.960 m Patient Age:    75 years           BP:           122/66 mmHg Patient Gender: M                  HR:           86 bpm. Exam Location:  Inpatient Procedure: 2D Echo, Cardiac Doppler and Color Doppler Indications:    Acute ischemic heart disease, unspecified I24.9  History:        Patient has prior history of Echocardiogram examinations, most                 recent 10/23/2021. CAD and Acute MI, Cardiac cath 05/01/22; Stroke.  Sonographer:    Roosvelt Maser RDCS Referring Phys: [id number] CHRISTOPHER D MCALHANY IMPRESSIONS  1. Left ventricular ejection fraction, by estimation, is 50 to 55%. The left ventricle has low normal function. The left ventricle has no regional wall motion abnormalities. Left ventricular diastolic parameters are indeterminate.  2. Right ventricular systolic function is normal. The right ventricular size is normal. There is mildly elevated pulmonary artery systolic pressure.  3. The mitral valve is normal in structure. Mild mitral valve regurgitation. No evidence of mitral stenosis.  4. The aortic valve is normal in structure. Aortic valve regurgitation is mild. No aortic stenosis is present.  5. The inferior vena cava is normal in size with greater than 50% respiratory variability, suggesting right atrial pressure of 3 mmHg. FINDINGS  Left Ventricle: Left ventricular ejection fraction, by estimation, is 50 to 55%. The left ventricle has low  normal function. The left ventricle has no regional wall motion abnormalities. The left ventricular internal cavity size was normal in size. There is no left ventricular hypertrophy. Left ventricular diastolic parameters are indeterminate. Right Ventricle: The right ventricular size  is normal. No increase in right ventricular wall thickness. Right ventricular systolic function is normal. There is mildly elevated pulmonary artery systolic pressure. The tricuspid regurgitant velocity is 2.67  m/s, and with an assumed right atrial pressure of 8 mmHg, the estimated right ventricular systolic pressure is 36.5 mmHg. Left Atrium: Left atrial size was normal in size. Right Atrium: Right atrial size was normal in size. Pericardium: There is no evidence of pericardial effusion. Mitral Valve: The mitral valve is normal in structure. Mild mitral valve regurgitation. No evidence of mitral valve stenosis. Tricuspid Valve: The tricuspid valve is normal in structure. Tricuspid valve regurgitation is mild . No evidence of tricuspid stenosis. Aortic Valve: The aortic valve is normal in structure. Aortic valve regurgitation is mild. No aortic stenosis is present. Aortic valve mean gradient measures 4.0 mmHg. Aortic valve peak gradient measures 7.5 mmHg. Aortic valve area, by VTI measures 2.81 cm. Pulmonic Valve: The pulmonic valve was normal in structure. Pulmonic valve regurgitation is not visualized. No evidence of pulmonic stenosis. Aorta: The aortic root is normal in size and structure. Venous: The inferior vena cava is normal in size with greater than 50% respiratory variability, suggesting right atrial pressure of 3 mmHg. IAS/Shunts: No atrial level shunt detected by color flow Doppler.  LEFT VENTRICLE PLAX 2D LVIDd:         4.40 cm   Diastology LVIDs:         3.20 cm   LV e' medial:    9.36 cm/s LV PW:         1.10 cm   LV E/e' medial:  9.9 LV IVS:        0.90 cm   LV e' lateral:   10.40 cm/s LVOT diam:     2.10 cm   LV E/e' lateral: 8.9 LV SV:         73 LV SV Index:   37 LVOT Area:     3.46 cm  RIGHT VENTRICLE             IVC RV Basal diam:  3.00 cm     IVC diam: 2.30 cm RV S prime:     12.80 cm/s TAPSE (M-mode): 2.3 cm LEFT ATRIUM             Index        RIGHT ATRIUM           Index LA diam:         4.10 cm 2.09 cm/m   RA Area:     15.20 cm LA Vol (A2C):   68.9 ml 35.15 ml/m  RA Volume:   35.10 ml  17.91 ml/m LA Vol (A4C):   45.1 ml 23.01 ml/m LA Biplane Vol: 58.8 ml 30.00 ml/m  AORTIC VALVE AV Area (Vmax):    2.88 cm AV Area (Vmean):   2.83 cm AV Area (VTI):     2.81 cm AV Vmax:           137.00 cm/s AV Vmean:          92.400 cm/s AV VTI:            0.260 m AV Peak Grad:      7.5 mmHg AV Mean Grad:  4.0 mmHg LVOT Vmax:         114.00 cm/s LVOT Vmean:        75.500 cm/s LVOT VTI:          0.211 m LVOT/AV VTI ratio: 0.81  AORTA Ao Root diam: 3.00 cm Ao Asc diam:  3.30 cm MITRAL VALVE               TRICUSPID VALVE MV Area (PHT): 4.49 cm    TR Peak grad:   28.5 mmHg MV Decel Time: 169 msec    TR Vmax:        267.00 cm/s MV E velocity: 92.80 cm/s MV A velocity: 91.70 cm/s  SHUNTS MV E/A ratio:  1.01        Systemic VTI:  0.21 m                            Systemic Diam: 2.10 cm Kardie Tobb DO Electronically signed by Thomasene RippleKardie Tobb DO Signature Date/Time: 05/01/2022/12:52:00 PM    Final    CARDIAC CATHETERIZATION  Result Date: 05/01/2022   Dist RCA lesion is 100% stenosed.   Mid LAD-1 lesion is 95% stenosed.   Mid LAD-2 lesion is 80% stenosed.   A drug-eluting stent was successfully placed using a SYNERGY XD 3.50X20.   Post intervention, there is a 0% residual stenosis. Acute inferior STEMI secondary to thrombotic occlusion of the distal RCA. Successful PTCA/DES x 1 distal RCA Severe stenoses in the mid LAD. No obstructive disease in the Circumflex LVEDP 20 mmHg Recommendations: Will admit to the ICU. Will start ASA 81 mg daily, high intensity statin. Will continue Plavix. Aggrastat infusion for 2 hours. Echo later today. He will need staged PCI of the LAD on Friday. Will hold Eliquis until after the staged PCI. I would plan 1 month of triple therapy with ASA/Plavix/Eliquis and then stop ASA in one month.   DG Chest Portable 1 View  Result Date: 05/01/2022 CLINICAL DATA:  Chest pain. EXAM: PORTABLE  CHEST 1 VIEW COMPARISON:  10/21/2020 FINDINGS: Stable heart size and mediastinal contours. Diffuse peribronchial thickening is new from prior. May be a slight right pleural effusion. No confluent consolidation. No pneumothorax. IMPRESSION: Diffuse peribronchial thickening is new from prior exam and may be pulmonary edema or bronchitis. Possible right pleural effusion. Electronically Signed   By: Narda RutherfordMelanie  Sanford M.D.   On: 05/01/2022 00:06   XR Knee 1-2 Views Left  Result Date: 04/10/2022 Standing AP both knees lateral left knee sunrise patella x-ray demonstrates some moderate knee osteoarthritis with joint space narrowing bilaterally and some flattening of the femoral condyle with small marginal osteophytes. 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  XR Lumbar Spine 2-3 Views  Result Date: 04/10/2022 AP lateral lumbar spine images are obtained and reviewed this shows right lumbar curvature 10 to 15 degrees.  Anterolisthesis grade 1 L3-4 with disc space narrowing L3-4 and L4-5. Impression: Lumbar spondylosis.  Negative for acute changes.   Disposition   Pt is being discharged home today in good condition.  Follow-up Plans & Appointments     Follow-up Information     Azalee CourseMeng, [place], [place] Follow up on 05/12/2022.   Specialties: Cardiology, Radiology Why: at 2:20pm for your follow up appt with Dr. D[facility]Schumanns' [facility] Contact information: [address] [place] [address] [phone]                Discharge Instructions  Amb Referral to Cardiac Rehabilitation   Complete by: As directed    [place]   Diagnosis:  STEMI Coronary Stents     After initial evaluation and assessments completed: Virtual Based Care may be provided alone or in conjunction with Phase 2 Cardiac Rehab based on patient barriers.: Yes   Diet - low sodium heart healthy   Complete by: As directed    Discharge instructions   Complete by: As directed    Radial Site Care Refer to  this sheet in the next few weeks. These instructions provide you with information on caring for yourself after your procedure. Your caregiver may also give you more specific instructions. Your treatment has been planned according to current medical practices, but problems sometimes occur. Call your caregiver if you have any problems or questions after your procedure. HOME CARE INSTRUCTIONS You may shower the day after the procedure. Remove the bandage (dressing) and gently wash the site with plain soap and water. Gently pat the site dry.  Do not apply powder or lotion to the site.  Do not submerge the affected site in water for 3 to 5 days.  Inspect the site at least twice daily.  Do not flex or bend the affected arm for 24 hours.  No lifting over 5 pounds (2.3 kg) for 5 days after your procedure.  Do not drive home if you are discharged the same day of the procedure. Have someone else drive you.  You may drive 24 hours after the procedure unless otherwise instructed by your caregiver.  What to expect: Any bruising will usually fade within 1 to 2 weeks.  Blood that collects in the tissue (hematoma) may be painful to the touch. It should usually decrease in size and tenderness within 1 to 2 weeks.  SEEK IMMEDIATE MEDICAL CARE IF: You have unusual pain at the radial site.  You have redness, warmth, swelling, or pain at the radial site.  You have drainage (other than a small amount of blood on the dressing).  You have chills.  You have a fever or persistent symptoms for more than 72 hours.  You have a fever and your symptoms suddenly get worse.  Your arm becomes pale, cool, tingly, or numb.  You have heavy bleeding from the site. Hold pressure on the site.    PLEASE DO NOT MISS ANY DOSES OF YOUR PLAVIX!!!!! Also keep a log of you blood pressures and bring back to your follow up appt. Please call the office with any questions.   Patients taking blood thinners should generally stay away from  medicines like ibuprofen, Advil, Motrin, naproxen, and Aleve due to risk of stomach bleeding. You may take Tylenol as directed or talk to your primary doctor about alternatives.  Some studies suggest Prilosec/Omeprazole interacts with Plavix. We changed your Prilosec/Omeprazole to the equivalent dose of Protonix for less chance of interaction.  PLEASE ENSURE THAT YOU DO NOT RUN OUT OF YOUR PLAVIX. This medication is very important to remain on for at least one year. IF you have issues obtaining this medication due to cost please CALL the office 3-5 business days prior to running out in order to prevent missing doses of this medication.   Increase activity slowly   Complete by: As directed    No wound care   Complete by: As directed        Discharge Medications   Allergies as of 05/03/2022       Reactions   Shellfish-derived Products Other (  See Comments)   Gout   Warfarin Sodium Rash        Medication List     STOP taking these medications    ibuprofen 200 MG tablet Commonly known as: ADVIL   metoprolol succinate 25 MG 24 hr tablet Commonly known as: TOPROL-XL   TAGAMET PO       TAKE these medications    acetaminophen 500 MG tablet Commonly known as: TYLENOL Take 1,000 mg by mouth every 6 (six) hours as needed for moderate pain.   allopurinol 100 MG tablet Commonly known as: ZYLOPRIM Take 1 tablet (100 mg total) by mouth daily.   apixaban 5 MG Tabs tablet Commonly known as: ELIQUIS Take 1 tablet (5 mg total) by mouth 2 (two) times daily.   aspirin EC 81 MG tablet Take 1 tablet (81 mg total) by mouth daily. Swallow whole.   Blink Tears 0.25 % Soln Generic drug: Polyethylene Glycol 400 Place 1 drop into both eyes 2 (two) times daily as needed (for dryness or irritation).   clopidogrel 75 MG tablet Commonly known as: PLAVIX Take 1 tablet (75 mg total) by mouth daily.   Colchicine 0.6 MG Caps Take 0.6 mg by mouth daily as needed (for gout flares).    metoprolol tartrate 25 MG tablet Commonly known as: LOPRESSOR Take 1 tablet (25 mg total) by mouth 2 (two) times daily.   multivitamin with minerals Tabs tablet Take 1 tablet by mouth daily.   rosuvastatin 40 MG tablet Commonly known as: CRESTOR Take 1 tablet (40 mg total) by mouth daily. What changed:  medication strength how much to take   sodium chloride 0.65 % Soln nasal spray Commonly known as: OCEAN Place 1 spray into both nostrils as needed for congestion.   tadalafil 5 MG tablet Commonly known as: CIALIS Take 5 mg by mouth daily as needed for erectile dysfunction.   trolamine salicylate 10 % cream Commonly known as: ASPERCREME Apply 1 Application topically as needed for muscle pain (Heel).        Outstanding Labs/Studies   FLP/LFTs in 8 weeks  Duration of Discharge Encounter   Greater than 30 minutes including physician time.  Signed, Laverda Page, NP 05/03/2022, 8:56 AM

## 2022-05-04 ENCOUNTER — Encounter: Payer: Self-pay | Admitting: Cardiology

## 2022-05-05 ENCOUNTER — Telehealth: Payer: Self-pay

## 2022-05-05 ENCOUNTER — Other Ambulatory Visit: Payer: Self-pay

## 2022-05-05 MED ORDER — PANTOPRAZOLE SODIUM 40 MG PO TBEC: 40.0000 mg | DELAYED_RELEASE_TABLET | Freq: Every day | ORAL | 1 refills | Status: DC

## 2022-05-05 MED FILL — Heparin Sod (Porcine)-NaCl IV Soln 1000 Unit/500ML-0.9%: INTRAVENOUS | Qty: 1000 | Status: AC

## 2022-05-05 MED FILL — Nitroglycerin IV Soln 100 MCG/ML in D5W: INTRA_ARTERIAL | Qty: 10 | Status: AC

## 2022-05-05 NOTE — Telephone Encounter (Signed)
-----   Message from Arty Baumgartner, NP sent at 05/03/2022  8:39 AM EDT ----- Regarding: Needs TOC call Needs TOC call, thx

## 2022-05-05 NOTE — Telephone Encounter (Signed)
Per Dr. Lalla Brothers and Dr. Clifton James, [name] Short will continue triple therapy for 1 month post-stent.  Scheduled the patient with Dr. Lalla Brothers 10/6 for check-up. Will schedule the patient 10/10 PM for pre-Watchman CT as he wishes to proceed with LAAO 10/19. The patient's wife understands this plan is tentative based on check-up with Dr. Lalla Brothers on 10/6. She was grateful for call and agrees with plan.  See 8/6 MyChart messages as well - the patient sent a message requesting Protonix. Discussed with Dr. Lalla Brothers- pantoprazole called in for patient.

## 2022-05-05 NOTE — Telephone Encounter (Signed)
Patient contacted regarding discharge from [facility] on 05/03/22.  Patient understands to follow up with provider Harrell Lark on 05/12/22 at 2:20 at [facility]. Patient understands discharge instructions? Yes Patient understands medications and regiment? Yes Patient understands to bring all medications to this visit? Yes  Ask patient:  Are you enrolled in My Chart: Yes  Patient has ecchymosis at rt wrist. No pain or edema. He reports tingling in fingertips from carpal tunnel and nothing new or different from cardiac cath

## 2022-05-05 NOTE — Telephone Encounter (Signed)
-----   Message from Kathleene Hazel, MD sent at 05/05/2022  8:58 AM EDT ----- Agree Sheria Lang. He can stop ASA in one month.  Thayer Ohm  ----- Message ----- From: Lanier Prude, MD Sent: 05/03/2022   9:28 AM EDT To: Filbert Schilder, NP; Sampson Goon, RN; #  [place] and [facility] y'all are well. Mr Nickolson came in with a STEMI and is now doing well. Still plan for Watchman down the road but want to have him see me before the procedure date just to check in on his recovery.  Holly spoke with Thayer Ohm (cc'ed here) re: anticoagulation plan. Triple therapy for 1 month followed by Eliquis/Plavix. Implant in about 2-3 months. Then transition to Aspirin/Plavix after implant. Thayer Ohm can you confirm you are alright with that antiplatelet strategy?  Orpha Bur and Noreene Larsson, can you have Mr [facility] see me or Noreene Larsson in clinic before the scheduled implant date? Thanks, Sheria Lang

## 2022-05-05 NOTE — Patient Outreach (Signed)
  Care Coordination [facility] Note Transition Care Management Unsuccessful Follow-up Telephone Call  Date of discharge and from where:  05/03/22 [facility]  Attempts:  1st Attempt  Reason for unsuccessful TCM follow-up call:  Left voice message  Dudley Major RN, [facility], CDE Care Management Coordinator [facility] Care Management [phone]

## 2022-05-06 ENCOUNTER — Other Ambulatory Visit: Payer: Self-pay

## 2022-05-06 ENCOUNTER — Ambulatory Visit (HOSPITAL_COMMUNITY): Payer: Medicare Other

## 2022-05-06 NOTE — Patient Outreach (Signed)
  Care Coordination [facility] Note Transition Care Management Follow-up Telephone Call Date of discharge and from where: 05/03/22 [facility] How have you been since you were released from the hospital? Wrist is healing well, having some gout issues  Any questions or concerns? No  Items Reviewed: Did the pt receive and understand the discharge instructions provided? Yes  Medications obtained and verified? Yes  Other? No  Any new allergies since your discharge? No  Dietary orders reviewed? Yes Do you have support at home? Yes   Home Care and Equipment/Supplies: Were home health services ordered? no If so, what is the name of the agency? N/A  Has the agency set up a time to come to the patient's home? not applicable Were any new equipment or medical supplies ordered?  No What is the name of the medical supply agency? N/A Were you able to get the supplies/equipment? not applicable Do you have any questions related to the use of the equipment or supplies? No  Functional Questionnaire: (I = Independent and D = Dependent) ADLs: I  Bathing/Dressing- I  Meal Prep- I  Eating- I  Maintaining continence- I  Transferring/Ambulation- I  Managing Meds- I  Follow up appointments reviewed:  PCP Hospital f/u appt confirmed? No  Scheduled to see Annual Wellness Visit on 05/08/22 @ 3PM. Specialist [facility] f/u appt confirmed? Yes  Scheduled to see Cardiology on 05/12/22 @ 2:20 PM. Are transportation arrangements needed? No  If their condition worsens, is the pt aware to call PCP or go to the Emergency Dept.? Yes Was the patient provided with contact information for the PCP's office or ED? Yes Was to pt encouraged to call back with questions or concerns? Yes  SDOH assessments and interventions completed:   Yes  Care Coordination Interventions Activated:  Yes   Care Coordination Interventions:   Reviewed to keep follow up appointments. Reviewed to call primary care if gout does not improve      Encounter Outcome:  Pt. Visit Completed   Dudley Major RN, BSN,CCM, CDE Care Management Coordinator [facility] Care Management [phone]

## 2022-05-08 ENCOUNTER — Ambulatory Visit (INDEPENDENT_AMBULATORY_CARE_PROVIDER_SITE_OTHER): Payer: Medicare Other

## 2022-05-08 DIAGNOSIS — Z0000 Encounter for general adult medical examination without abnormal findings: Secondary | ICD-10-CM

## 2022-05-08 NOTE — Progress Notes (Signed)
Subjective:   [name] Short is a 76 y.o. male who presents for an Subsequent  Medicare Annual Wellness Visit.   I connected with Fransico Michael  today by telephone and verified that I am speaking with the correct person using two identifiers. Location patient: home Location provider: work Persons participating in the virtual visit: patient, provider.   I discussed the limitations, risks, security and privacy concerns of performing an evaluation and management service by telephone and the availability of in person appointments. I also discussed with the patient that there may be a patient responsible charge related to this service. The patient expressed understanding and verbally consented to this telephonic visit.    Interactive audio and video telecommunications were attempted between this provider and patient, however failed, due to patient having technical difficulties OR patient did not have access to video capability.  We continued and completed visit with audio only.    Review of Systems     Cardiac Risk Factors include: advanced age (>47mn, >>28women);dyslipidemia;male gender;hypertension     Objective:    Today's Vitals   There is no height or weight on file to calculate BMI.     05/08/2022    3:10 PM 05/01/2022    2:00 AM 04/30/2022   11:42 PM 01/05/2021    8:00 PM 10/21/2020   11:00 PM 10/16/2020    8:12 AM 08/13/2020    9:10 AM  Advanced Directives  Does Patient Have a Medical Advance Directive?  _0  No  Would patient like information on creating a medical advance directive? No - Patient declined No - Patient declined  No - Patient declined Yes (Inpatient - patient requests chaplain consult to create a medical advance directive) Yes (MAU/Ambulatory/Procedural Areas - Information given) Yes (MAU/Ambulatory/Procedural Areas - Information given)    Current Medications (verified) Outpatient Encounter Medications as of 05/08/2022  Medication Sig    acetaminophen (TYLENOL) 500 MG tablet Take 1,000 mg by mouth every 6 (six) hours as needed for moderate pain.   allopurinol (ZYLOPRIM) 100 MG tablet Take 1 tablet (100 mg total) by mouth daily.   apixaban (ELIQUIS) 5 MG TABS tablet Take 1 tablet (5 mg total) by mouth 2 (two) times daily.   aspirin EC 81 MG tablet Take 1 tablet (81 mg total) by mouth daily. Swallow whole.   clopidogrel (PLAVIX) 75 MG tablet Take 1 tablet (75 mg total) by mouth daily.   Colchicine 0.6 MG CAPS Take 0.6 mg by mouth daily as needed (for gout flares).   metoprolol tartrate (LOPRESSOR) 25 MG tablet Take 1 tablet (25 mg total) by mouth 2 (two) times daily.   Multiple Vitamin (MULTIVITAMIN WITH MINERALS) TABS Take 1 tablet by mouth daily.   nitroGLYCERIN (NITROSTAT) 0.4 MG SL tablet Place 1 tablet (0.4 mg total) under the tongue every 5 (five) minutes as needed.   pantoprazole (PROTONIX) 40 MG tablet Take 1 tablet (40 mg total) by mouth daily.   Polyethylene Glycol 400 (BLINK TEARS) 0.25 % SOLN Place 1 drop into both eyes 2 (two) times daily as needed (for dryness or irritation).   rosuvastatin (CRESTOR) 40 MG tablet Take 1 tablet (40 mg total) by mouth daily.   sodium chloride (OCEAN) 0.65 % SOLN nasal spray Place 1 spray into both nostrils as needed for congestion.   tadalafil (CIALIS) 5 MG tablet Take 5 mg by mouth daily as needed for erectile dysfunction.   trolamine salicylate (ASPERCREME) 10 % cream Apply 1 Application topically  as needed for muscle pain (Heel).   No facility-administered encounter medications on file as of 05/08/2022.    Allergies (verified) Shellfish-derived products and Warfarin sodium   History: Past Medical History:  Diagnosis Date   Arthritis    WRIST   Benign localized prostatic hyperplasia with lower urinary tract symptoms (LUTS)    Complication of anesthesia    slow to wake   ED (erectile dysfunction)    GERD (gastroesophageal reflux disease)    History of concussion    AS  CHILD--  NO RESIDUAL   History of gout    History of kidney stones    Hyperlipidemia    Inguinal hernia, bilateral    Nephrolithiasis    BILATERAL   PAF (paroxysmal atrial fibrillation) ([place] currently being followed by pcp   EPISODE --  2011  FOLLOW-UP W/ DR WALL  /  HAS NOT SEEN ANY CARDIOLOGIST SINCE   Past Surgical History:  Procedure Laterality Date   APPENDECTOMY  1983   CARDIOVASCULAR STRESS TEST  05-08-2010  DR WALL   NO EVIDENCE OF SCAR OR ISCHEMIA/ EF 54%/  PT HAD BOTH AFIB/ AFLUTTER DURING STUDY   CARPAL TUNNEL RELEASE Right 01/ 14/ 2021   CORONARY STENT INTERVENTION N/A 05/02/2022   Procedure: CORONARY STENT INTERVENTION;  Surgeon: Burnell Blanks, MD;  Location: [place] [facility];  Service: Cardiovascular;  Laterality: N/A;   CORONARY/GRAFT ACUTE MI REVASCULARIZATION N/A 05/01/2022   Procedure: Coronary/Graft Acute MI Revascularization;  Surgeon: Burnell Blanks, MD;  Location: [place] [facility];  Service: Cardiovascular;  Laterality: N/A;   CYSTOSCOPY W/ URETERAL STENT PLACEMENT Left 09/20/2013   Procedure: CYSTOSCOPY WITH STENT REPLACEMENT;  Surgeon: Fredricka Bonine, MD;  [facility];  Service: Urology;  Laterality: Left;   CYSTOSCOPY WITH URETEROSCOPY Left 09/20/2013   Procedure: CYSTOSCOPY WITH URETEROSCOPY;  Surgeon: Fredricka Bonine, MD;  Location: [facility];  Service: Urology;  Laterality: Left;   CYSTOSCOPY WITH URETEROSCOPY AND STENT PLACEMENT Left 07/12/2013   Procedure: CYSTOSCOPY WITH LITHOLAPEXY, LEFT URETERAL STENT PLACEMENT;  Surgeon: Fredricka Bonine, MD;  Location: [facility];  Service: Urology;  Laterality: Left;   CYSTOSCOPY WITH URETEROSCOPY AND STENT PLACEMENT Bilateral 06/23/2014   Procedure: BILATERAL URETEROSCOPY, HOLMIUM LASER LITHOTRIPSY AND STENT PLACEMENT, RIGHT ;  Surgeon: Festus Aloe, MD;  Location: [facility];  Service: Urology;   Laterality: Bilateral;   EXTRACORPOREAL SHOCK WAVE LITHOTRIPSY  X2   EXTRACORPOREAL SHOCK WAVE LITHOTRIPSY Left 08-29-2013;   07-28-2013   EXTRACORPOREAL SHOCK WAVE LITHOTRIPSY Right 12/09/2018   Procedure: EXTRACORPOREAL SHOCK WAVE LITHOTRIPSY (ESWL);  Surgeon: Festus Aloe, MD;  Location: [facility];  Service: Urology;  Laterality: Right;   HOLMIUM LASER APPLICATION Left 21/30/8657   Procedure: HOLMIUM LASER APPLICATION;  Surgeon: Fredricka Bonine, MD;  Location: [facility];  Service: Urology;  Laterality: Left;   INGUINAL HERNIA REPAIR Bilateral 10/16/2020   Procedure: BILATERAL OPEN INGUINAL HERNIA REPAIR WITH MESH;  Surgeon: Coralie Keens, MD;  Location: [place];  Service: General;  Laterality: Bilateral;  LMA/TAP BLOCK   LAPAROSCOPIC INGUINAL HERNIA REPAIR Bilateral 09-04-2010   w/ mesh   LEFT HEART CATH AND CORONARY ANGIOGRAPHY N/A 05/01/2022   Procedure: LEFT HEART CATH AND CORONARY ANGIOGRAPHY;  Surgeon: Burnell Blanks, MD;  Location: [place] [facility];  Service: Cardiovascular;  Laterality: N/A;   TONSILLECTOMY  AS CHILD   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   TOTAL HIP ARTHROPLASTY  Right 11/15/2012   Procedure: RIGHT TOTAL HIP ARTHROPLASTY ANTERIOR APPROACH;  Surgeon: Marybelle Killings, MD;  Location: [place];  Service: Orthopedics;  Laterality: Right;  Right total hip arthroplasty   TRANSTHORACIC ECHOCARDIOGRAM  05-08-2010   NORMAL LVSF/  EF 24%/  GRADE I DIASTOLIC DYSFUNCTION/  MILD BILATERAL [facility] ENLARGEMENT   Family History  Problem Relation Age of Onset   Diabetes Mother    Arthritis Mother    Rheum arthritis Mother    Dementia Mother    Heart disease Father    Prostate cancer Father    Diabetes Sister    Diabetes Maternal Grandmother    Diabetes Maternal Grandfather    Bipolar disorder Brother    Diabetes Brother    Dementia Brother    Neuropathy Brother    Social History   Socioeconomic History   Marital  status: Married    Spouse name: Dawn   Number of children: Not on file   Years of education: Not on file   Highest education level: Not on file  Occupational History   Occupation: retired  Tobacco Use   Smoking status: Light Smoker    Types: Cigars   Smokeless tobacco: Never   Tobacco comments:    AVERAGE 3 CIGARS PER WEEK  Vaping Use   Vaping Use: Never used  Substance and Sexual Activity   Alcohol use: Not Currently    Comment: occasional   Drug use: No   Sexual activity: Not on file  Other Topics Concern   Not on file  Social History Narrative   Not on file   Social Determinants of Health   Financial Resource Strain: Low Risk  (05/08/2022)   Overall Financial Resource Strain (CARDIA)    Difficulty of Paying Living Expenses: Not hard at all  Food Insecurity: No Food Insecurity (05/08/2022)   Hunger Vital Sign    Worried About Running Out of Food in the Last Year: Never true    Ran Out of Food in the Last Year: Never true  Transportation Needs: No Transportation Needs (05/08/2022)   PRAPARE - Hydrologist (Medical): No    Lack of Transportation (Non-Medical): No  Physical Activity: Inactive (05/08/2022)   Exercise Vital Sign    Days of Exercise per Week: 0 days    Minutes of Exercise per Session: 0 min  Stress: No Stress Concern Present (05/08/2022)   [place]    Feeling of Stress : Not at all  Social Connections: Moderately Isolated (05/08/2022)   Social Connection and Isolation Panel [NHANES]    Frequency of Communication with Friends and Family: Three times a week    Frequency of Social Gatherings with Friends and Family: Three times a week    Attends Religious Services: Never    Active Member of Clubs or Organizations: No    Attends Music therapist: Never    Marital Status: Married    Tobacco Counseling Ready to quit: Not Answered Counseling given: Not  Answered Tobacco comments: AVERAGE 3 CIGARS PER WEEK   Clinical Intake:  Pre-visit preparation completed: Yes  Pain : No/denies pain     Nutritional Risks: None Diabetes: No  How often do you need to have someone help you when you read instructions, pamphlets, or other written materials from your doctor or pharmacy?: 1 - Never What is the last grade level you completed in school?: college  Diabetic?no   Interpreter Needed?: No  Information entered by :: L.Wilson,LPN   Activities of Daily Living    05/08/2022    3:12 PM 05/06/2022    5:33 PM  In your present state of health, do you have any difficulty performing the following activities:  Hearing? 0 0  Vision? 0 0  Difficulty concentrating or making decisions? 0 0  Walking or climbing stairs? 0 0  Dressing or bathing? 0 0  Doing errands, shopping? 0 0  Preparing Food and eating ? N N  Using the Toilet? N N  In the past six months, have you accidently leaked urine? N N  Do you have problems with loss of bowel control? N N  Managing your Medications? N N  Managing your Finances? N N  Housekeeping or managing your Housekeeping? N N    Patient Care Team: Midge Minium, MD as PCP - General Donato Heinz, MD as PCP - Cardiology (Cardiology) Vickie Epley, MD as PCP - Electrophysiology (Cardiology) Festus Aloe, MD as Consulting Physician (Urology) Marybelle Killings, MD as Consulting Physician (Orthopedic Surgery) Gatha Mayer, MD as Consulting Physician (Gastroenterology) Harriett Sine, MD as Consulting Physician (Dermatology) Erroll Luna, MD as Consulting Physician (General Surgery) Coralie Keens, MD as Consulting Physician ([facility]) Madelin Rear, [facility] as Pharmacist (Pharmacist)  Indicate any recent Medical Services you may have received from other than [facility] providers in the past year (date may be approximate).     Assessment:   This is a routine wellness examination for  Keandre.  Hearing/Vision screen Vision Screening - Comments:: Annual eye exams wear glasses   Dietary issues and exercise activities discussed: Current Exercise Habits: The patient does not participate in regular exercise at present, Exercise limited by: cardiac condition(s)   Goals Addressed   None    Depression Screen    05/08/2022    3:12 PM 05/08/2022    3:10 PM 02/12/2022    9:46 AM 08/14/2021   10:27 AM 02/11/2021    8:26 AM 11/26/2020    2:35 PM 08/13/2020    9:29 AM  PHQ 2/9 Scores  PHQ - 2 Score 0 0 0 0 0 0 0  PHQ- 9 Score   0 1 0  0    Fall Risk    05/08/2022    3:11 PM 05/06/2022    5:33 PM 02/12/2022    9:46 AM 08/14/2021   10:27 AM 02/11/2021    8:26 AM  Fall Risk   Falls in the past year? 0 0 0 0 0  Number falls in past yr: 0  0  0  Injury with Fall? 0  0  0  Risk for fall due to :   No Fall Risks No Fall Risks No Fall Risks  Follow up Falls evaluation completed;Education provided  Falls evaluation completed Falls evaluation completed     [place]:  Any stairs in or around the home? Yes  If so, are there any without handrails? No  Home free of loose throw rugs in walkways, pet beds, electrical cords, etc? Yes  Adequate lighting in your home to reduce risk of falls? Yes   ASSISTIVE DEVICES UTILIZED TO PREVENT FALLS:  Life alert? No  Use of a cane, walker or w/c? Yes  Grab bars in the bathroom? Yes  Shower chair or bench in shower? No  Elevated toilet seat or a handicapped toilet? No   Cognitive Function: Normal cognitive status assessed by telephone conversation  by this  Nurse Health Advisor. No abnormalities found.      07/28/2018   10:27 AM  MMSE - Mini Mental State Exam  Orientation to time 5  Orientation to Place 5  Registration 3  Attention/ Calculation 5  Recall 3  Language- name 2 objects 2  Language- repeat 1  Language- follow 3 step command 3  Language- read & follow direction 1  Write a sentence 1   Copy design 1  Total score 30        05/08/2022    3:15 PM  6CIT Screen  What Year? 0 points  What month? 0 points  What time? 0 points  Count back from 20 0 points  Months in reverse 0 points  Repeat phrase 0 points  Total Score 0 points    Immunizations Immunization History  Administered Date(s) Administered   Fluad Quad(high Dose 65+) 06/25/2019, 08/13/2020   Influenza Split 08/24/2012   Influenza Whole 08/26/2010   Influenza, High Dose Seasonal PF 07/07/2014, 07/23/2017, 07/28/2018   Influenza,inj,Quad PF,6+ Mos 06/08/2013, 07/17/2015, 07/18/2016   Influenza-Unspecified 07/07/2021   Moderna SARS-COV2 Booster Vaccination 08/16/2020   Moderna Sars-Covid-2 Vaccination 10/24/2019, 11/21/2019   PFIZER(Purple Top)SARS-COV-2 Vaccination 03/07/2021   Pfizer Covid-19 Vaccine Bivalent Booster 48yr & up 07/22/2021   Pneumococcal Conjugate-13 07/14/2014   Pneumococcal Polysaccharide-23 06/08/2013   Tdap 09/16/2016   Typhoid Live 06/11/2017   Zoster, Live 05/17/2012    TDAP status: Up to date  Flu Vaccine status: Up to date  Pneumococcal vaccine status: Up to date  Covid-19 vaccine status: Completed vaccines  Qualifies for Shingles Vaccine? Yes   Zostavax completed No   Shingrix Completed?: No.    Education has been provided regarding the importance of this vaccine. Patient has been advised to call insurance company to determine out of pocket expense if they have not yet received this vaccine. Advised may also receive vaccine at local pharmacy or Health Dept. Verbalized acceptance and understanding.  Screening Tests Health Maintenance  Topic Date Due   Fecal DNA (Cologuard)  11/02/2020   COVID-19 Vaccine (5 - Mixed Product risk series) 09/16/2021   INFLUENZA VACCINE  04/29/2022   TETANUS/TDAP  09/16/2026   Pneumonia Vaccine 75 Years old  Completed   Hepatitis C Screening  Completed   HPV VACCINES  Aged Out   Zoster Vaccines- Shingrix  Discontinued    Health  Maintenance  Health Maintenance Due  Topic Date Due   Fecal DNA (Cologuard)  11/02/2020   COVID-19 Vaccine (5 - Mixed Product risk series) 09/16/2021   INFLUENZA VACCINE  04/29/2022    Colorectal cancer screening: Referral to GI placed patient has Cologuard Kit to complete. Pt aware the office will call re: appt.  Lung Cancer Screening: (Low Dose CT Chest recommended if Age 75-80years, 30 pack-year currently smoking OR have quit w/in 15years.) does not qualify.   Lung Cancer Screening Referral: n/a  Additional Screening:  Hepatitis C Screening: does not qualify;   Vision Screening: Recommended annual ophthalmology exams for early detection of glaucoma and other disorders of the eye. Is the patient up to date with their annual eye exam?  Yes  Who is the provider or what is the name of the office in which the patient attends annual eye exams? [facility] If pt is not established with a provider, would they like to be referred to a provider to establish care? No .   Dental Screening: Recommended annual dental exams for proper oral hygiene  Community Resource Referral /  Chronic Care Management: CRR required this visit?  No   CCM required this visit?  No      Plan:     I have personally reviewed and noted the following in the patient's chart:   Medical and social history Use of alcohol, tobacco or illicit drugs  Current medications and supplements including opioid prescriptions. Patient is not currently taking opioid prescriptions. Functional ability and status Nutritional status Physical activity Advanced directives List of other physicians Hospitalizations, surgeries, and ER visits in previous 12 months Vitals Screenings to include cognitive, depression, and falls Referrals and appointments  In addition, I have reviewed and discussed with patient certain preventive protocols, quality metrics, and best practice recommendations. A written personalized care plan for  preventive services as well as general preventive health recommendations were provided to patient.     Daphane Shepherd, LPN   06/22/2682   Nurse Notes: none

## 2022-05-08 NOTE — Patient Instructions (Signed)
[name] Short , Thank you for taking time to come for your Medicare Wellness Visit. I appreciate your ongoing commitment to your health goals. Please review the following plan we discussed and let me know if I can assist you in the future.   Screening recommendations/referrals: Colonoscopy: Patient has Cologuard Kit to complete Recommended yearly ophthalmology/optometry visit for glaucoma screening and checkup Recommended yearly dental visit for hygiene and checkup  Vaccinations: Influenza vaccine: completed  Pneumococcal vaccine: completed  Tdap vaccine: 09/16/2016 Shingles vaccine: completed per patient     Advanced directives: none   Conditions/risks identified: none   Next appointment: none   Preventive Care 80 Years and Older, Male Preventive care refers to lifestyle choices and visits with your health care provider that can promote health and wellness. What does preventive care include? A yearly physical exam. This is also called an annual well check. Dental exams once or twice a year. Routine eye exams. Ask your health care provider how often you should have your eyes checked. Personal lifestyle choices, including: Daily care of your teeth and gums. Regular physical activity. Eating a healthy diet. Avoiding tobacco and drug use. Limiting alcohol use. Practicing safe sex. Taking low doses of aspirin every day. Taking vitamin and mineral supplements as recommended by your health care provider. What happens during an annual well check? The services and screenings done by your health care provider during your annual well check will depend on your age, overall health, lifestyle risk factors, and family history of disease. Counseling  Your health care provider may ask you questions about your: Alcohol use. Tobacco use. Drug use. Emotional well-being. Home and relationship well-being. Sexual activity. Eating habits. History of falls. Memory and ability to understand  (cognition). Work and work Statistician. Screening  You may have the following tests or measurements: Height, weight, and BMI. Blood pressure. Lipid and cholesterol levels. These may be checked every 5 years, or more frequently if you are over 30 years old. Skin check. Lung cancer screening. You may have this screening every year starting at age 74 if you have a 30-pack-year history of smoking and currently smoke or have quit within the past 15 years. Fecal occult blood test (FOBT) of the stool. You may have this test every year starting at age 30. Flexible sigmoidoscopy or colonoscopy. You may have a sigmoidoscopy every 5 years or a colonoscopy every 10 years starting at age 24. Prostate cancer screening. Recommendations will vary depending on your family history and other risks. Hepatitis C blood test. Hepatitis B blood test. Sexually transmitted disease (STD) testing. Diabetes screening. This is done by checking your blood sugar (glucose) after you have not eaten for a while (fasting). You may have this done every 1-3 years. Abdominal aortic aneurysm (AAA) screening. You may need this if you are a current or former smoker. Osteoporosis. You may be screened starting at age 62 if you are at high risk. Talk with your health care provider about your test results, treatment options, and if necessary, the need for more tests. Vaccines  Your health care provider may recommend certain vaccines, such as: Influenza vaccine. This is recommended every year. Tetanus, diphtheria, and acellular pertussis (Tdap, Td) vaccine. You may need a Td booster every 10 years. Zoster vaccine. You may need this after age 4. Pneumococcal 13-valent conjugate (PCV13) vaccine. One dose is recommended after age 74. Pneumococcal polysaccharide (PPSV23) vaccine. One dose is recommended after age 27. Talk to your health care provider about which screenings and vaccines  you need and how often you need them. This  information is not intended to replace advice given to you by your health care provider. Make sure you discuss any questions you have with your health care provider. Document Released: 10/12/2015 Document Revised: 06/04/2016 Document Reviewed: 07/17/2015 Elsevier Interactive Patient Education  2017 [place] Prevention in the Home Falls can cause injuries. They can happen to people of all ages. There are many things you can do to make your home safe and to help prevent falls. What can I do on the outside of my home? Regularly fix the edges of walkways and driveways and fix any cracks. Remove anything that might make you trip as you walk through a door, such as a raised step or threshold. Trim any bushes or trees on the path to your home. Use bright outdoor lighting. Clear any walking paths of anything that might make someone trip, such as rocks or tools. Regularly check to see if handrails are loose or broken. Make sure that both sides of any steps have handrails. Any raised decks and porches should have guardrails on the edges. Have any leaves, snow, or ice cleared regularly. Use sand or salt on walking paths during winter. Clean up any spills in your garage right away. This includes oil or grease spills. What can I do in the bathroom? Use night lights. Install grab bars by the toilet and in the tub and shower. Do not use towel bars as grab bars. Use non-skid mats or decals in the tub or shower. If you need to sit down in the shower, use a plastic, non-slip stool. Keep the floor dry. Clean up any water that spills on the floor as soon as it happens. Remove soap buildup in the tub or shower regularly. Attach bath mats securely with double-sided non-slip rug tape. Do not have throw rugs and other things on the floor that can make you trip. What can I do in the bedroom? Use night lights. Make sure that you have a light by your bed that is easy to reach. Do not use any sheets or  blankets that are too big for your bed. They should not hang down onto the floor. Have a firm chair that has side arms. You can use this for support while you get dressed. Do not have throw rugs and other things on the floor that can make you trip. What can I do in the kitchen? Clean up any spills right away. Avoid walking on wet floors. Keep items that you use a lot in easy-to-reach places. If you need to reach something above you, use a strong step stool that has a grab bar. Keep electrical cords out of the way. Do not use floor polish or wax that makes floors slippery. If you must use wax, use non-skid floor wax. Do not have throw rugs and other things on the floor that can make you trip. What can I do with my stairs? Do not leave any items on the stairs. Make sure that there are handrails on both sides of the stairs and use them. Fix handrails that are broken or loose. Make sure that handrails are as long as the stairways. Check any carpeting to make sure that it is firmly attached to the stairs. Fix any carpet that is loose or worn. Avoid having throw rugs at the top or bottom of the stairs. If you do have throw rugs, attach them to the floor with carpet tape. Make sure  that you have a light switch at the top of the stairs and the bottom of the stairs. If you do not have them, ask someone to add them for you. What else can I do to help prevent falls? Wear shoes that: Do not have high heels. Have rubber bottoms. Are comfortable and fit you well. Are closed at the toe. Do not wear sandals. If you use a stepladder: Make sure that it is fully opened. Do not climb a closed stepladder. Make sure that both sides of the stepladder are locked into place. Ask someone to hold it for you, if possible. Clearly mark and make sure that you can see: Any grab bars or handrails. First and last steps. Where the edge of each step is. Use tools that help you move around (mobility aids) if they are  needed. These include: Canes. Walkers. Scooters. Crutches. Turn on the lights when you go into a dark area. Replace any light bulbs as soon as they burn out. Set up your furniture so you have a clear path. Avoid moving your furniture around. If any of your floors are uneven, fix them. If there are any pets around you, be aware of where they are. Review your medicines with your doctor. Some medicines can make you feel dizzy. This can increase your chance of falling. Ask your doctor what other things that you can do to help prevent falls. This information is not intended to replace advice given to you by your health care provider. Make sure you discuss any questions you have with your health care provider. Document Released: 07/12/2009 Document Revised: 02/21/2016 Document Reviewed: 10/20/2014 Elsevier Interactive Patient Education  2017 [employer].

## 2022-05-09 ENCOUNTER — Telehealth (HOSPITAL_COMMUNITY): Payer: Self-pay

## 2022-05-09 NOTE — Telephone Encounter (Signed)
Per phase I cardiac rehab, fax cardiac rehab referral to [place] [facility]. 

## 2022-05-12 ENCOUNTER — Encounter: Payer: Self-pay | Admitting: Physician Assistant

## 2022-05-12 ENCOUNTER — Ambulatory Visit (INDEPENDENT_AMBULATORY_CARE_PROVIDER_SITE_OTHER): Payer: Medicare Other | Admitting: Physician Assistant

## 2022-05-12 VITALS — BP 116/70 | HR 80 | Ht 67.0 in | Wt 168.8 lb

## 2022-05-12 DIAGNOSIS — I4819 Other persistent atrial fibrillation: Secondary | ICD-10-CM

## 2022-05-12 DIAGNOSIS — I2111 ST elevation (STEMI) myocardial infarction involving right coronary artery: Secondary | ICD-10-CM | POA: Diagnosis not present

## 2022-05-12 DIAGNOSIS — Z8673 Personal history of transient ischemic attack (TIA), and cerebral infarction without residual deficits: Secondary | ICD-10-CM

## 2022-05-12 DIAGNOSIS — R319 Hematuria, unspecified: Secondary | ICD-10-CM | POA: Diagnosis not present

## 2022-05-12 DIAGNOSIS — E785 Hyperlipidemia, unspecified: Secondary | ICD-10-CM

## 2022-05-12 DIAGNOSIS — I10 Essential (primary) hypertension: Secondary | ICD-10-CM

## 2022-05-12 DIAGNOSIS — I2510 Atherosclerotic heart disease of native coronary artery without angina pectoris: Secondary | ICD-10-CM | POA: Diagnosis not present

## 2022-05-12 NOTE — Progress Notes (Unsigned)
Cardiology Office Note:    Date:  05/14/2022   ID:  [name], DOB Jul 24, 1946, MRN [id number]  PCP:  Sheliah Hatch, MD   [facility] Providers Cardiologist:  Little Ishikawa, MD Electrophysiologist:  Lanier Prude, MD     Referring MD: Sheliah Hatch, MD   Chief Complaint  Patient presents with   Follow-up    Seen for Dr. Bjorn Pippin    History of Present Illness:    [name] Short is a 76 y.o. male with a hx of atrial fibrillation, hypertension, hyperlipidemia, CVA and recently diagnosed CAD.  Patient has a history of PAF however has not been anticoagulation therapy.  He presented to the ED with slurred speech and left hand weakness in January 2022 and was found to have acute CVA.  MRI showed acute infarct of the posterior right frontal lobe.  He also had residual left-sided weakness and dysarthria.  Echocardiogram at that time showed normal EF.  He was started on Eliquis 5 mg twice a day as the stroke was felt to be related to atrial fibrillation.  Heart monitor showed 18% A-fib burden with longest episode lasting 1 day and 7 hours, average heart rate 80 bpm.  He was last seen by Dr. Bjorn Pippin, he was referred to Dr. Lalla Brothers for evaluation of Watchman procedure.    More recently, patient presented to the hospital on 05/01/2022 with chest pain, shortness of breath and transient diaphoresis.  EKG showed ST elevations in inferior leads with reciprocal changes in V1 and V2.  Initial cardiac catheterization revealed 100% distal RCA occlusion treated with PTCA and DES x1, 95% mid LAD lesion, 80% mid LAD lesion.  LVEDP was elevated at 20 mmHg, patient was given IV Lasix.  Patient was brought back to the Cath Lab on the following day on 05/02/2022 and underwent DES to mid LAD.  Postprocedure, he was started on aspirin, Plavix and Eliquis with plan to discontinue aspirin after 1 month.  Patient presents today for follow-up.  He denies any further chest pain  or worsening dyspnea.  He is accompanied by his wife.  He has been compliant with therapy including aspirin, Plavix and Eliquis.  I instructed the patient to stop aspirin on 06/02/2022 by which point he has completed 1 month course of aspirin.  He will need a fasting lipid panel and LFT in 4 weeks after Crestor was increased.  His LDL is actually pretty well controlled in the hospital.  He does have chronic hematuria after started on Eliquis since early 2022, given the addition of aspirin and the Plavix, I will check a CBC as well when he returns.  He has follow-up with Dr. Lalla Brothers in October and Dr. Bjorn Pippin in December.   Past Medical History:  Diagnosis Date   Arthritis    WRIST   Benign localized prostatic hyperplasia with lower urinary tract symptoms (LUTS)    Complication of anesthesia    slow to wake   ED (erectile dysfunction)    GERD (gastroesophageal reflux disease)    History of concussion    AS CHILD--  NO RESIDUAL   History of gout    History of kidney stones    Hyperlipidemia    Inguinal hernia, bilateral    Nephrolithiasis    BILATERAL   PAF (paroxysmal atrial fibrillation) (HCC) currently being followed by pcp   EPISODE --  2011  FOLLOW-UP W/ DR WALL  /  HAS NOT SEEN ANY CARDIOLOGIST SINCE  Past Surgical History:  Procedure Laterality Date   APPENDECTOMY  1983   CARDIOVASCULAR STRESS TEST  05-08-2010  DR WALL   NO EVIDENCE OF SCAR OR ISCHEMIA/ EF 54%/  PT HAD BOTH AFIB/ AFLUTTER DURING STUDY   CARPAL TUNNEL RELEASE Right 01/ 14/ 2021   CORONARY STENT INTERVENTION N/A 05/02/2022   Procedure: CORONARY STENT INTERVENTION;  Surgeon: Kathleene HazelMcAlhany, Christopher D, MD;  Location: [facility];  Service: Cardiovascular;  Laterality: N/A;   CORONARY/GRAFT ACUTE MI REVASCULARIZATION N/A 05/01/2022   Procedure: Coronary/Graft Acute MI Revascularization;  Surgeon: Kathleene HazelMcAlhany, Christopher D, MD;  Location: [facility];  Service: Cardiovascular;  Laterality: N/A;   CYSTOSCOPY W/  URETERAL STENT PLACEMENT Left 09/20/2013   Procedure: CYSTOSCOPY WITH STENT REPLACEMENT;  Surgeon: Antony HasteMatthew Ramsey Eskridge, MD;  [facility] [place];  Service: Urology;  Laterality: Left;   CYSTOSCOPY WITH URETEROSCOPY Left 09/20/2013   Procedure: CYSTOSCOPY WITH URETEROSCOPY;  Surgeon: Antony HasteMatthew Ramsey Eskridge, MD;  [facility] [place];  Service: Urology;  Laterality: Left;   CYSTOSCOPY WITH URETEROSCOPY AND STENT PLACEMENT Left 07/12/2013   Procedure: CYSTOSCOPY WITH LITHOLAPEXY, LEFT URETERAL STENT PLACEMENT;  Surgeon: Antony HasteMatthew Ramsey Eskridge, MD;  [facility] [place];  Service: Urology;  Laterality: Left;   CYSTOSCOPY WITH URETEROSCOPY AND STENT PLACEMENT Bilateral 06/23/2014   Procedure: BILATERAL URETEROSCOPY, HOLMIUM LASER LITHOTRIPSY AND STENT PLACEMENT, RIGHT ;  Surgeon: Jerilee FieldMatthew Eskridge, MD;  [facility] [place];  Service: Urology;  Laterality: Bilateral;   EXTRACORPOREAL SHOCK WAVE LITHOTRIPSY  X2   EXTRACORPOREAL SHOCK WAVE LITHOTRIPSY Left 08-29-2013;   07-28-2013   EXTRACORPOREAL SHOCK WAVE LITHOTRIPSY Right 12/09/2018   Procedure: EXTRACORPOREAL SHOCK WAVE LITHOTRIPSY (ESWL);  Surgeon: Jerilee FieldEskridge, Matthew, MD;  Location: [facility];  Service: Urology;  Laterality: Right;   HOLMIUM LASER APPLICATION Left 09/20/2013   Procedure: HOLMIUM LASER APPLICATION;  Surgeon: Antony HasteMatthew Ramsey Eskridge, MD;  [facility] [place];  Service: Urology;  Laterality: Left;   INGUINAL HERNIA REPAIR Bilateral 10/16/2020   Procedure: BILATERAL OPEN INGUINAL HERNIA REPAIR WITH MESH;  Surgeon: Abigail MiyamotoBlackman, Douglas, MD;  [facility] [place];  Service: General;  Laterality: Bilateral;  LMA/TAP BLOCK   LAPAROSCOPIC INGUINAL HERNIA REPAIR Bilateral 09-04-2010   w/ mesh   LEFT HEART CATH AND CORONARY ANGIOGRAPHY N/A 05/01/2022   Procedure: LEFT HEART CATH AND CORONARY ANGIOGRAPHY;  Surgeon: Kathleene HazelMcAlhany, Christopher D, MD;  [facility];  Service: Cardiovascular;  Laterality: N/A;   TONSILLECTOMY  AS CHILD   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   TOTAL HIP ARTHROPLASTY Right 11/15/2012   Procedure: RIGHT TOTAL HIP ARTHROPLASTY ANTERIOR APPROACH;  Surgeon: Eldred MangesMark C Yates, MD;  Location: [facility];  Service: Orthopedics;  Laterality: Right;  Right total hip arthroplasty   TRANSTHORACIC ECHOCARDIOGRAM  05-08-2010   NORMAL LVSF/  EF 55%/  GRADE I DIASTOLIC DYSFUNCTION/  MILD BILATERAL ATRIUM ENLARGEMENT    Current Medications: Current Meds  Medication Sig   acetaminophen (TYLENOL) 500 MG tablet Take 1,000 mg by mouth every 6 (six) hours as needed for moderate pain.   allopurinol (ZYLOPRIM) 100 MG tablet Take 1 tablet (100 mg total) by mouth daily.   apixaban (ELIQUIS) 5 MG TABS tablet Take 1 tablet (5 mg total) by mouth 2 (two) times daily.   aspirin EC 81 MG tablet Take 1 tablet (81 mg total) by mouth daily. Swallow whole.   clopidogrel (PLAVIX) 75 MG tablet Take 1 tablet (75 mg total) by mouth daily.  Colchicine 0.6 MG CAPS Take 0.6 mg by mouth daily as needed (for gout flares).   metoprolol tartrate (LOPRESSOR) 25 MG tablet Take 1 tablet (25 mg total) by mouth 2 (two) times daily.   Multiple Vitamin (MULTIVITAMIN WITH MINERALS) TABS Take 1 tablet by mouth daily.   nitroGLYCERIN (NITROSTAT) 0.4 MG SL tablet Place 1 tablet (0.4 mg total) under the tongue every 5 (five) minutes as needed.   pantoprazole (PROTONIX) 40 MG tablet Take 1 tablet (40 mg total) by mouth daily.   Polyethylene Glycol 400 (BLINK TEARS) 0.25 % SOLN Place 1 drop into both eyes 2 (two) times daily as needed (for dryness or irritation).   rosuvastatin (CRESTOR) 40 MG tablet Take 1 tablet (40 mg total) by mouth daily.   sodium chloride (OCEAN) 0.65 % SOLN nasal spray Place 1 spray into both nostrils as needed for congestion.   tadalafil (CIALIS) 5 MG tablet Take 5 mg by mouth daily as needed for erectile dysfunction.   trolamine salicylate  (ASPERCREME) 10 % cream Apply 1 Application topically as needed for muscle pain (Heel).     Allergies:   Shellfish-derived products and Warfarin sodium   Social History   Socioeconomic History   Marital status: Married    Spouse name: Dawn   Number of children: Not on file   Years of education: Not on file   Highest education level: Not on file  Occupational History   Occupation: retired  Tobacco Use   Smoking status: Light Smoker    Types: Cigars   Smokeless tobacco: Never   Tobacco comments:    AVERAGE 3 CIGARS PER WEEK  Vaping Use   Vaping Use: Never used  Substance and Sexual Activity   Alcohol use: Not Currently    Comment: occasional   Drug use: No   Sexual activity: Not on file  Other Topics Concern   Not on file  Social History Narrative   Not on file   Social Determinants of Health   Financial Resource Strain: Low Risk  (05/08/2022)   Overall Financial Resource Strain (CARDIA)    Difficulty of Paying Living Expenses: Not hard at all  Food Insecurity: No Food Insecurity (05/08/2022)   Hunger Vital Sign    Worried About Running Out of Food in the Last Year: Never true    Ran Out of Food in the Last Year: Never true  Transportation Needs: No Transportation Needs (05/08/2022)   PRAPARE - Administrator, Civil Service (Medical): No    Lack of Transportation (Non-Medical): No  Physical Activity: Inactive (05/08/2022)   Exercise Vital Sign    Days of Exercise per Week: 0 days    Minutes of Exercise per Session: 0 min  Stress: No Stress Concern Present (05/08/2022)   [employer] of Occupational Health - Occupational Stress Questionnaire    Feeling of Stress : Not at all  Social Connections: Moderately Isolated (05/08/2022)   Social Connection and Isolation Panel [NHANES]    Frequency of Communication with Friends and Family: Three times a week    Frequency of Social Gatherings with Friends and Family: Three times a week    Attends Religious  Services: Never    Active Member of Clubs or Organizations: No    Attends Banker Meetings: Never    Marital Status: Married     Family History: The patient's family history includes Arthritis in his mother; Bipolar disorder in his brother; Dementia in his brother and mother; Diabetes in his  brother, maternal grandfather, maternal grandmother, mother, and sister; Heart disease in his father; Neuropathy in his brother; Prostate cancer in his father; Rheum arthritis in his mother.  ROS:   Please see the history of present illness.     All other systems reviewed and are negative.  EKGs/Labs/Other Studies Reviewed:    The following studies were reviewed today:  Echo 05/01/2022  1. Left ventricular ejection fraction, by estimation, is 50 to 55%. The  left ventricle has low normal function. The left ventricle has no regional  wall motion abnormalities. Left ventricular diastolic parameters are  indeterminate.   2. Right ventricular systolic function is normal. The right ventricular  size is normal. There is mildly elevated pulmonary artery systolic  pressure.   3. The mitral valve is normal in structure. Mild mitral valve  regurgitation. No evidence of mitral stenosis.   4. The aortic valve is normal in structure. Aortic valve regurgitation is  mild. No aortic stenosis is present.   5. The inferior vena cava is normal in size with greater than 50%  respiratory variability, suggesting right atrial pressure of 3 mmHg  EKG:  EKG is ordered today.  The ekg ordered today demonstrates atrial fibrillation, rate controlled.  Recent Labs: 05/01/2022: ALT 18; B Natriuretic Peptide 64.5; TSH 2.377 05/03/2022: BUN 8; Creatinine, Ser 0.96; Hemoglobin 12.8; Platelets 155; Potassium 3.7; Sodium 135  Recent Lipid Panel    Component Value Date/Time   CHOL 104 05/01/2022 0209   TRIG 62 05/01/2022 0209   HDL 44 05/01/2022 0209   CHOLHDL 2.4 05/01/2022 0209   VLDL 12 05/01/2022 0209    LDLCALC 48 05/01/2022 0209   LDLDIRECT 128.1 05/17/2012 0901     Risk Assessment/Calculations:    CHA2DS2-VASc Score = 5   This indicates a 7.2% annual risk of stroke. The patient's score is based upon: CHF History: 1 HTN History: 1 Diabetes History: 0 Stroke History: 2 Vascular Disease History: 0 Age Score: 1 Gender Score: 0          Physical Exam:    VS:  BP 116/70   Pulse 80   Ht 5\' 7"  (1.702 m)   Wt 168 lb 12.8 oz (76.6 kg)   SpO2 97%   BMI 26.44 kg/m        Wt Readings from Last 3 Encounters:  05/12/22 168 lb 12.8 oz (76.6 kg)  05/02/22 172 lb 12.8 oz (78.4 kg)  04/30/22 174 lb (78.9 kg)     GEN:  Well nourished, well developed in no acute distress HEENT: Normal NECK: No JVD; No carotid bruits LYMPHATICS: No lymphadenopathy CARDIAC: Irregularly irregular, no murmurs, rubs, gallops RESPIRATORY:  Clear to auscultation without rales, wheezing or rhonchi  ABDOMEN: Soft, non-tender, non-distended MUSCULOSKELETAL:  No edema; No deformity  SKIN: Warm and dry NEUROLOGIC:  Alert and oriented x 3 PSYCHIATRIC:  Normal affect   ASSESSMENT:    1. Coronary artery disease involving native coronary artery of native heart without angina pectoris   2. Hyperlipidemia LDL goal <70   3. Hematuria, unspecified type   4. Primary hypertension   5. H/O: CVA (cerebrovascular accident)   6. Persistent atrial fibrillation (HCC)    PLAN:    In order of problems listed above:  CAD: Recently underwent DES to distal RCA and LAD.  Currently on triple therapy including aspirin, Plavix and Eliquis.  Patient is aware to stop aspirin after 1 month.  He denies any further chest discomfort  Hyperlipidemia: Continue Crestor.  Obtain fasting  lipid panel and LFT in 6 to 8 weeks  Hematuria: Patient states he has chronic hematuria since Eliquis was started in 2022.  We will obtain CBC  Hypertension: Blood pressure stable on current therapy  History of CVA: No recent  recurrence  Persistent atrial fibrillation: On the evaluation by Dr. Lalla Brothers for Watchman device.            Medication Adjustments/Labs and Tests Ordered: Current medicines are reviewed at length with the patient today.  Concerns regarding medicines are outlined above.  Orders Placed This Encounter  Procedures   CBC   Lipid panel   Hepatic function panel   EKG 12-Lead   No orders of the defined types were placed in this encounter.   Patient Instructions  Medication Instructions:  STOP Aspirin on 06/02/22 *If you need a refill on your cardiac medications before your next appointment, please call your pharmacy*  Lab Work: Your physician recommends that you return for lab work in 4 weeks:  CBC Fasting Lipid Panel-DO NOT EAT OR DRINK past midnight. Okay to have water  Hepatic (Liver) Function Test  If you have labs (blood work) drawn today and your tests are completely normal, you will receive your results only by: MyChart Message (if you have MyChart) OR A paper copy in the mail If you have any lab test that is abnormal or we need to change your treatment, we will call you to review the results.  Testing/Procedures: NONE ordered at this time of appointment   Follow-Up: At [facility], you and your health needs are our priority.  As part of our continuing mission to provide you with exceptional heart care, we have created designated Provider Care Teams.  These Care Teams include your primary Cardiologist (physician) and Advanced Practice Providers (APPs -  Physician Assistants and Nurse Practitioners) who all work together to provide you with the care you need, when you need it.  Your next appointment:   As previously scheduled   The format for your next appointment:   In Person  Provider:   Little Ishikawa, MD    Other Instructions   Important Information About Sugar     '    Ramond Dial, [place]  05/14/2022 11:45 PM    [facility]

## 2022-05-12 NOTE — Patient Instructions (Signed)
Medication Instructions:  STOP Aspirin on 06/02/22 *If you need a refill on your cardiac medications before your next appointment, please call your pharmacy*  Lab Work: Your physician recommends that you return for lab work in 4 weeks:  CBC Fasting Lipid Panel-DO NOT EAT OR DRINK past midnight. Okay to have water  Hepatic (Liver) Function Test  If you have labs (blood work) drawn today and your tests are completely normal, you will receive your results only by: MyChart Message (if you have MyChart) OR A paper copy in the mail If you have any lab test that is abnormal or we need to change your treatment, we will call you to review the results.  Testing/Procedures: NONE ordered at this time of appointment   Follow-Up: At [facility], you and your health needs are our priority.  As part of our continuing mission to provide you with exceptional heart care, we have created designated Provider Care Teams.  These Care Teams include your primary Cardiologist (physician) and Advanced Practice Providers (APPs -  Physician Assistants and Nurse Practitioners) who all work together to provide you with the care you need, when you need it.  Your next appointment:   As previously scheduled   The format for your next appointment:   In Person  Provider:   Little Ishikawa, MD    Other Instructions   Important Information About Sugar     '

## 2022-05-13 ENCOUNTER — Telehealth: Payer: Self-pay | Admitting: Orthopaedic Surgery

## 2022-05-13 ENCOUNTER — Ambulatory Visit
Admission: RE | Admit: 2022-05-13 | Discharge: 2022-05-13 | Disposition: A | Discharge status: Home / Self Care | Payer: Medicare Other | Source: Ambulatory Visit | Attending: Orthopaedic Surgery | Admitting: Orthopaedic Surgery

## 2022-05-13 DIAGNOSIS — M48061 Spinal stenosis, lumbar region without neurogenic claudication: Secondary | ICD-10-CM | POA: Diagnosis not present

## 2022-05-13 DIAGNOSIS — M545 Low back pain, unspecified: Secondary | ICD-10-CM

## 2022-05-13 DIAGNOSIS — M4126 Other idiopathic scoliosis, lumbar region: Secondary | ICD-10-CM | POA: Diagnosis not present

## 2022-05-13 NOTE — Telephone Encounter (Signed)
Called pt 1X and left vm for pt to call and set an MRI review appt after 8/16 with Dr Ophelia Charter

## 2022-05-14 ENCOUNTER — Encounter: Payer: Self-pay | Admitting: Physician Assistant

## 2022-05-15 ENCOUNTER — Other Ambulatory Visit: Payer: Self-pay | Admitting: *Deleted

## 2022-05-15 ENCOUNTER — Telehealth: Payer: Self-pay

## 2022-05-15 NOTE — Patient Outreach (Signed)
  Care Coordination   05/15/2022 Name: KELYN KOSKELA MRN: [id number] DOB: April 30, 1946   Care Coordination Outreach Attempts:  An unsuccessful telephone outreach was attempted today to offer the patient information about available care coordination services as a benefit of their health plan.   Follow Up Plan:  Additional outreach attempts will be made to offer the patient care coordination information and services.   Encounter Outcome:  No Answer  Care Coordination Interventions Activated:  No   Care Coordination Interventions:  No, not indicated    Elliot Cousin, RN Care Management Coordinator [facility] [employer] [facility] Main Office [phone]

## 2022-05-15 NOTE — Telephone Encounter (Signed)
Appointment made

## 2022-05-16 ENCOUNTER — Encounter: Payer: Self-pay | Admitting: Family Medicine

## 2022-05-16 ENCOUNTER — Ambulatory Visit (INDEPENDENT_AMBULATORY_CARE_PROVIDER_SITE_OTHER): Payer: Medicare Other | Admitting: Family Medicine

## 2022-05-16 VITALS — BP 138/76 | HR 69 | Temp 97.3°F | Resp 17 | Ht 67.0 in | Wt 172.0 lb

## 2022-05-16 DIAGNOSIS — M1000 Idiopathic gout, unspecified site: Secondary | ICD-10-CM | POA: Diagnosis not present

## 2022-05-16 DIAGNOSIS — I2111 ST elevation (STEMI) myocardial infarction involving right coronary artery: Secondary | ICD-10-CM | POA: Diagnosis not present

## 2022-05-16 DIAGNOSIS — R319 Hematuria, unspecified: Secondary | ICD-10-CM

## 2022-05-16 MED ORDER — ALLOPURINOL 100 MG PO TABS: 200.0000 mg | ORAL_TABLET | Freq: Every day | ORAL | 1 refills | Status: DC

## 2022-05-16 NOTE — Patient Instructions (Signed)
Follow up in 6 months to recheck BP and cholesterol INCREASE the Allopurinol to 200mg  daily (2 tabs) If after 90 days the gout is still not controlled, let me know and we can increase to 300mg  daily If you continue to have large amounts of blood in your urine- please call Dr and let him know Call with any questions or concerns Hang in there!

## 2022-05-16 NOTE — Progress Notes (Signed)
   Subjective:    Patient ID: [name], male    DOB: [birth year], 76 y.o.   MRN: [id number]  HPI Hospital f/u- pt was admitted 8/2-5 w/ STEMI.  Cath showed distal RCA 100% stenosed, mid LAD 1 96% stenosed, mid LAD 2 80% stenosed.  He had successful PTCA/DES x1 to distal RCA lesion.  And then they did a 2nd intervention on the LAD.  He was admitted to ICU and started on ASA 81mg , Crestor 40mg  daily, Plavix.  Was recommended to have 1 month of triple therapy w/ ASA/Plavix/Eliquis and then stop ASA.  Was referred to cardiac rehab.  Has already seen Cards for f/u.  Pt reports he is feeling well w/ exception of ongoing gout issues in his feet.  Would like to increase his Allopurinol dose.  Hematuria- pt reports this will occur after being physically active.  Pt reports Dr is aware.  He states this week urine is 'normal'.  Has had cystoscopy earlier this summer.   Review of Systems For ROS see HPI     Objective:   Physical Exam Vitals reviewed.  Constitutional:      General: He is not in acute distress.    Appearance: Normal appearance. He is well-developed. He is not ill-appearing.  HENT:     Head: Normocephalic and atraumatic.  Eyes:     Extraocular Movements: Extraocular movements intact.     Conjunctiva/sclera: Conjunctivae normal.     Pupils: Pupils are equal, round, and reactive to light.  Neck:     Thyroid: No thyromegaly.  Cardiovascular:     Rate and Rhythm: Normal rate and regular rhythm.     Pulses: Normal pulses.     Heart sounds: Normal heart sounds. No murmur heard. Pulmonary:     Effort: Pulmonary effort is normal. No respiratory distress.     Breath sounds: Normal breath sounds.  Abdominal:     General: Bowel sounds are normal. There is no distension.     Palpations: Abdomen is soft.  Musculoskeletal:     Cervical back: Normal range of motion and neck supple.     Right lower leg: No edema.     Left lower leg: No edema.  Lymphadenopathy:      Cervical: No cervical adenopathy.  Skin:    General: Skin is warm and dry.  Neurological:     Mental Status: He is alert and oriented to person, place, and time. Mental status is at baseline.     Cranial Nerves: No cranial nerve deficit.  Psychiatric:        Mood and Affect: Mood normal.        Behavior: Behavior normal.           Assessment & Plan:

## 2022-05-16 NOTE — Assessment & Plan Note (Signed)
Ongoing issue for pt.  He is currently on triple therapy w/ ASA, Plavix, and Eliquis.  He states that Dr Mena Goes is aware of the bleeding and he had a cystoscopy earlier this summer.  Has urology f/u in October.  Instructed him to reach out if bleeding becomes more regular or heavier.  Pt expressed understanding and is in agreement w/ plan.

## 2022-05-16 NOTE — Assessment & Plan Note (Signed)
Deteriorated.  Pt has hx of gout and is currently on Allopurinol 100mg  daily.  Ortho encouraged him to discuss increasing dose for better symptom control.  Will change to 200mg  daily and send new prescription to pharmacy.

## 2022-05-16 NOTE — Assessment & Plan Note (Signed)
New.  Reviewed hospital H&P, d/c summary, reconciled meds.  Pt reports he is currently pain free.  Not having any shortness of breath.  Has had Cardiology f/u and is being considered for a Watchman device.

## 2022-05-22 ENCOUNTER — Other Ambulatory Visit: Payer: Self-pay | Admitting: *Deleted

## 2022-05-22 ENCOUNTER — Encounter: Payer: Self-pay | Admitting: *Deleted

## 2022-05-22 NOTE — Patient Instructions (Signed)
Visit Information  Thank you for taking time to visit with me today. Please don't hesitate to contact me if I can be of assistance to you.   Following are the goals we discussed today:   Goals Addressed               This Visit's Progress     No follow up needed (pt-stated)        Care Coordination Interventions: Provided education to patient and/or caregiver about advanced directives Reviewed medications with patient and discussed purpose of all medications Reviewed scheduled/upcoming provider appointments including pending appointments and verified AWV was completed on 05/08/2022 and next years visit already scheduled. Screening for signs and symptoms of depression related to chronic disease state  Assessed social determinant of health barriers           Please call the care guide team at [phone] if you need to cancel or reschedule your appointment.   If you are experiencing a Mental Health or Behavioral Health Crisis or need someone to talk to, please call the Suicide and Crisis Lifeline: 988 call the [place] National Suicide Prevention Lifeline: (617) 296-4653 or TTY: 602-793-8857 TTY 9478162872) to talk to a trained counselor call 1-800-273-TALK (toll free, 24 hour hotline)  Patient verbalizes understanding of instructions and care plan provided today and agrees to view in MyChart. Active MyChart status and patient understanding of how to access instructions and care plan via MyChart confirmed with patient.     No further follow up required: No further needs  Elliot Cousin, RN Care Management Coordinator [facility] [facility] Main Office [phone]

## 2022-05-22 NOTE — Patient Outreach (Signed)
  Care Coordination   Initial Visit Note   05/22/2022 Name: [name] Short MRN: [id number] DOB: Oct 10, 1945  [name] Short is a 76 y.o. year old male who sees Tabori, Helane Rima, MD for primary care. I spoke with  Berneda Rose Jourdan by phone today.  What matters to the patients health and wellness today?  No need    Goals Addressed               This Visit's Progress     No follow up needed (pt-stated)        Care Coordination Interventions: Provided education to patient and/or caregiver about advanced directives Reviewed medications with patient and discussed purpose of all medications Reviewed scheduled/upcoming provider appointments including pending appointments and verified AWV was completed on 05/08/2022 and next years visit already scheduled. Screening for signs and symptoms of depression related to chronic disease state  Assessed social determinant of health barriers          SDOH assessments and interventions completed:  Yes  SDOH Interventions Today    Flowsheet Row Most Recent Value  SDOH Interventions   Food Insecurity Interventions Intervention Not Indicated  Housing Interventions Intervention Not Indicated  Transportation Interventions Intervention Not Indicated        Care Coordination Interventions Activated:  Yes  Care Coordination Interventions:  Yes, provided   Follow up plan: No further intervention required.   Encounter Outcome:  Pt. Visit Completed   Elliot Cousin, RN Care Management Coordinator [facility] [employer] Main Office [phone]

## 2022-06-04 ENCOUNTER — Ambulatory Visit (INDEPENDENT_AMBULATORY_CARE_PROVIDER_SITE_OTHER): Payer: Medicare Other | Admitting: Orthopaedic Surgery

## 2022-06-04 ENCOUNTER — Encounter: Payer: Self-pay | Admitting: Orthopaedic Surgery

## 2022-06-04 DIAGNOSIS — I2111 ST elevation (STEMI) myocardial infarction involving right coronary artery: Secondary | ICD-10-CM | POA: Diagnosis not present

## 2022-06-04 DIAGNOSIS — M48061 Spinal stenosis, lumbar region without neurogenic claudication: Secondary | ICD-10-CM | POA: Insufficient documentation

## 2022-06-04 DIAGNOSIS — M48062 Spinal stenosis, lumbar region with neurogenic claudication: Secondary | ICD-10-CM | POA: Diagnosis not present

## 2022-06-04 NOTE — Progress Notes (Signed)
Office Visit Note   Patient: [name] Short           Date of Birth: July 30, 1946           MRN: [id number] Visit Date: 06/04/2022              Requested by: Sheliah Hatch, MD [address] [place],  [place] [address] PCP: Sheliah Hatch, MD   Assessment & Plan: Visit Diagnoses:  1. Spinal stenosis of lumbar region with neurogenic claudication     Plan: Reviewed MRI scan patient is currently on Plavix.  He has some lumbar curvature synovial cyst on the left neuroforamina adjacent nerve but does not like it is compressing it.  He has moderate to severe stenosis at L3-4 with lateral recess stenosis.  Also mild to moderate stenosis at L4-5.  Wife is present she had an epidural in the past and states it did great but we discussed will result with the epidural injections with the diagnosis of spinal stenosis.  Check him back again in 2 months.  Long discussion about pathophysiology of spinal stenosis treatment options lumbar curvature that is present.  I gave him copy of the MRI report we reviewed the scans in detail.   Follow-Up Instructions: Return in about 2 months (around 08/04/2022).   Orders:  No orders of the defined types were placed in this encounter.  No orders of the defined types were placed in this encounter.     Procedures: No procedures performed   Clinical Data: No additional findings.   Subjective: Chief Complaint  Patient presents with   Lower Back - Pain, Follow-up    MRI lumbar review    HPI 76 year old male returns he states he recently had MRI he is on Eliquis with 100% blockage.  He said intermittent atrial fibs and states he is getting ready to have a Watchman procedure for closure of the atrial appendage.  He has had back pain with claudication symptoms more pain radiating down the left leg stopping by the knee.  Pain with standing for 10 minutes pain with prolonged walking more than 200 feet.  Review of Systems all systems updated  unchanged other than as mentioned above.   Objective: Vital Signs: BP 109/71   Pulse 61   Ht 5\' 7"  (1.702 m)   Wt 170 lb (77.1 kg)   BMI 26.63 kg/m   Physical Exam Constitutional:      Appearance: He is well-developed.  HENT:     Head: Normocephalic and atraumatic.     Right Ear: External ear normal.     Left Ear: External ear normal.  Eyes:     Pupils: Pupils are equal, round, and reactive to light.  Neck:     Thyroid: No thyromegaly.     Trachea: No tracheal deviation.  Cardiovascular:     Rate and Rhythm: Normal rate.  Pulmonary:     Effort: Pulmonary effort is normal.     Breath sounds: No wheezing.  Abdominal:     General: Bowel sounds are normal.     Palpations: Abdomen is soft.  Musculoskeletal:     Cervical back: Neck supple.  Skin:    General: Skin is warm and dry.     Capillary Refill: Capillary refill takes less than 2 seconds.  Neurological:     Mental Status: He is alert and oriented to person, place, and time.  Psychiatric:        Behavior: Behavior normal.  Thought Content: Thought content normal.        Judgment: Judgment normal.     Ortho Exam lumbar curvature negative straight leg raising 90 degrees.  Anterior tib quads are strong.  Bilateral total hip arthroplasty incisions well-healed.  Specialty Comments:  No specialty comments available.  Imaging: Narrative & Impression  CLINICAL DATA:  76 year old male with persistent low back pain radiating down the left leg.   EXAM: MRI LUMBAR SPINE WITHOUT CONTRAST   TECHNIQUE: Multiplanar, multisequence MR imaging of the lumbar spine was performed. No intravenous contrast was administered.   COMPARISON:  Lumbar radiographs 04/08/2022.   FINDINGS: Segmentation:  Normal on the comparison.   Alignment: Stable from last month. Lumbar lordosis with mild retrolisthesis of L1 on L2 and similar mild grade 1 anterolisthesis of L5 on S1. Subtle anterolisthesis of L3 on L4. Underlying  mild dextroconvex lumbar scoliosis.   Vertebrae: Faint degenerative endplate marrow edema laterally on the right at L4-L5. Chronic degenerative endplate marrow signal changes at L1-L2. Normal background bone marrow signal. Intact visible sacrum and SI joints.   Conus medullaris and cauda equina: Conus extends to the L1 level. No lower spinal cord or conus signal abnormality.   Paraspinal and other soft tissues: Negative.   Disc levels:   T10-T11 and T11-T12 both appear normal for age.   T12-L1: Disc space loss with mild circumferential disc bulge. No significant stenosis.   L1-L2: Severe disc space loss. Mild retrolisthesis. Circumferential disc osteophyte complex eccentric to the left. Mild facet hypertrophy greater on the left. No spinal stenosis. Mild left lateral recess stenosis. Moderate to severe left L1 foraminal stenosis.   L2-L3:  Negative.   L3-L4: Mild anterolisthesis. Disc desiccation and mild disc space loss with circumferential disc/pseudo disc. Moderate to severe facet and ligament flavum hypertrophy with degenerative facet joint fluid on the left. Moderate to severe spinal stenosis (series 3, image 23). Lateral recess stenosis greater on the right (right L4 nerve level). Mild bilateral L3 foraminal stenosis.   L4-L5: Disc space loss. Circumferential disc bulge and endplate spurring. Moderate facet and ligament flavum hypertrophy. Mild to moderate right L4 neural foraminal stenosis. Mild spinal stenosis. No convincing left foraminal stenosis. Mild left lateral recess stenosis (left L5 nerve level).   L5-S1: Anterolisthesis with mild circumferential disc/pseudo disc. Moderate to severe facet hypertrophy. Degenerative facet joint fluid. Bulky posteriorly situated synovial cyst on the left which should not cause neural compromise (series 3, image 37), but an anterior synovial cyst projects toward the left L5 neural foramen and exiting nerve on series 13,  image 36 and series 5, image 13. Still, only mild left foraminal stenosis. No spinal or convincing lateral recess stenosis.   IMPRESSION: 1. Mild dextroconvex scoliosis and multilevel mild spondylolisthesis in the lumbar spine with advanced disc and posterior element degeneration. 2. L5-S1 facet arthropathy, including a synovial cyst projecting toward the left neural foramen. Query Left L5 radiculitis. 3. Multifactorial moderate to severe spinal stenosis at L3-L4 with right greater than left lateral recess stenosis. 4. Mild spinal stenosis at L4-L5. Up to moderate right L4 neural foraminal stenosis.     Electronically Signed   By: Odessa Fleming M.D.   On: 05/15/2022 09:36        PMFS History: Patient Active Problem List   Diagnosis Date Noted   Spinal stenosis of lumbar region 06/04/2022   Hematuria 05/16/2022   Coronary artery disease involving native coronary artery of native heart with unstable angina pectoris (HCC)    STEMI (  ST elevation myocardial infarction) (HCC) 05/01/2022   Acute ST elevation myocardial infarction (STEMI) of inferior wall (HCC)    Recurrent kidney stones 08/14/2021   History of cerebrovascular accident (CVA) with residual deficit 02/11/2021   CVA (cerebral vascular accident) (HCC) 10/21/2020   Bilateral primary osteoarthritis of knee 02/15/2020   Acquired trigger finger of right little finger 11/03/2019   Encounter for orthopedic follow-up care 10/13/2019   H/O total hip arthroplasty, bilateral 10/24/2018   Gout 08/18/2017   Overweight (BMI 25.0-29.9) 01/23/2017   Foot pain, left 02/11/2016   Sebaceous cyst 07/14/2014   Sun-damaged skin 07/14/2014   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/14/2014   Physical exam, annual 05/17/2012   Inguinal hernia 08/12/2010   HYPERLIPIDEMIA TYPE I / IV 05/01/2010   TOBACCO USER 05/01/2010   Paroxysmal a-fib (HCC) 05/01/2010   ABNORMAL ELECTROCARDIOGRAM 03/26/2010   Past Medical History:  Diagnosis Date   Arthritis     WRIST   Benign localized prostatic hyperplasia with lower urinary tract symptoms (LUTS)    Complication of anesthesia    slow to wake   ED (erectile dysfunction)    GERD (gastroesophageal reflux disease)    History of concussion    AS CHILD--  NO RESIDUAL   History of gout    History of kidney stones    Hyperlipidemia    Inguinal hernia, bilateral    Nephrolithiasis    BILATERAL   PAF (paroxysmal atrial fibrillation) (HCC) currently being followed by pcp   EPISODE --  2011  FOLLOW-UP W/ DR WALL  /  HAS NOT SEEN ANY CARDIOLOGIST SINCE    Family History  Problem Relation Age of Onset   Diabetes Mother    Arthritis Mother    Rheum arthritis Mother    Dementia Mother    Heart disease Father    Prostate cancer Father    Diabetes Sister    Diabetes Maternal Grandmother    Diabetes Maternal Grandfather    Bipolar disorder Brother    Diabetes Brother    Dementia Brother    Neuropathy Brother     Past Surgical History:  Procedure Laterality Date   APPENDECTOMY  1983   CARDIOVASCULAR STRESS TEST  05-08-2010  DR WALL   NO EVIDENCE OF SCAR OR ISCHEMIA/ EF 54%/  PT HAD BOTH AFIB/ AFLUTTER DURING STUDY   CARPAL TUNNEL RELEASE Right 01/ 14/ 2021   CORONARY STENT INTERVENTION N/A 05/02/2022   Procedure: CORONARY STENT INTERVENTION;  Surgeon: Kathleene Hazel, MD;  Location: [facility];  Service: Cardiovascular;  Laterality: N/A;   CORONARY/GRAFT ACUTE MI REVASCULARIZATION N/A 05/01/2022   Procedure: Coronary/Graft Acute MI Revascularization;  Surgeon: Kathleene Hazel, MD;  Location: [facility];  Service: Cardiovascular;  Laterality: N/A;   CYSTOSCOPY W/ URETERAL STENT PLACEMENT Left 09/20/2013   Procedure: CYSTOSCOPY WITH STENT REPLACEMENT;  Surgeon: Antony Haste, MD;  Location: [facility];  Service: Urology;  Laterality: Left;   CYSTOSCOPY WITH URETEROSCOPY Left 09/20/2013   Procedure: CYSTOSCOPY WITH URETEROSCOPY;  Surgeon: Antony Haste, MD;  Location: [facility];  Service: Urology;  Laterality: Left;   CYSTOSCOPY WITH URETEROSCOPY AND STENT PLACEMENT Left 07/12/2013   Procedure: CYSTOSCOPY WITH LITHOLAPEXY, LEFT URETERAL STENT PLACEMENT;  Surgeon: Antony Haste, MD;  Location: [facility];  Service: Urology;  Laterality: Left;   CYSTOSCOPY WITH URETEROSCOPY AND STENT PLACEMENT Bilateral 06/23/2014   Procedure: BILATERAL URETEROSCOPY, HOLMIUM LASER LITHOTRIPSY AND STENT PLACEMENT, RIGHT ;  Surgeon:  Jerilee Field, MD;  Location[facility];  Service: Urology;  Laterality: Bilateral;   EXTRACORPOREAL SHOCK WAVE LITHOTRIPSY  X2   EXTRACORPOREAL SHOCK WAVE LITHOTRIPSY Left 08-29-2013;   07-28-2013   EXTRACORPOREAL SHOCK WAVE LITHOTRIPSY Right 12/09/2018   Procedure: EXTRACORPOREAL SHOCK WAVE LITHOTRIPSY (ESWL);  Surgeon: Jerilee Field, MD;  Location: [facility];  Service: Urology;  Laterality: Right;   HOLMIUM LASER APPLICATION Left 09/20/2013   Procedure: HOLMIUM LASER APPLICATION;  Surgeon: Antony Haste, MD;  Location: [facility];  Service: Urology;  Laterality: Left;   INGUINAL HERNIA REPAIR Bilateral 10/16/2020   Procedure: BILATERAL OPEN INGUINAL HERNIA REPAIR WITH MESH;  Surgeon: Abigail Miyamoto, MD;  Location: [facility];  Service: General;  Laterality: Bilateral;  LMA/TAP BLOCK   LAPAROSCOPIC INGUINAL HERNIA REPAIR Bilateral 09-04-2010   w/ mesh   LEFT HEART CATH AND CORONARY ANGIOGRAPHY N/A 05/01/2022   Procedure: LEFT HEART CATH AND CORONARY ANGIOGRAPHY;  Surgeon: Kathleene Hazel, MD;  Location: [facility];  Service: Cardiovascular;  Laterality: N/A;   TONSILLECTOMY  AS CHILD   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   TOTAL HIP ARTHROPLASTY Right 11/15/2012   Procedure: RIGHT TOTAL HIP ARTHROPLASTY ANTERIOR APPROACH;  Surgeon: Eldred Manges, MD;  Location: [facility];  Service: Orthopedics;   Laterality: Right;  Right total hip arthroplasty   TRANSTHORACIC ECHOCARDIOGRAM  05-08-2010   NORMAL LVSF/  EF 55%/  GRADE I DIASTOLIC DYSFUNCTION/  MILD BILATERAL [facility] ENLARGEMENT   Social History   Occupational History   Occupation: retired  Tobacco Use   Smoking status: Light Smoker    Types: Cigars   Smokeless tobacco: Never   Tobacco comments:    AVERAGE 3 CIGARS PER WEEK  Vaping Use   Vaping Use: Never used  Substance and Sexual Activity   Alcohol use: Not Currently    Comment: occasional   Drug use: No   Sexual activity: Not on file

## 2022-06-30 ENCOUNTER — Other Ambulatory Visit: Payer: Self-pay | Admitting: Cardiology

## 2022-06-30 NOTE — Telephone Encounter (Signed)
Pt's pharmacy is requesting a refill on pantoprazole. Would Dr. Lambert like to refill this medication? Please address 

## 2022-07-03 NOTE — Progress Notes (Deleted)
Electrophysiology Office Follow up Visit Note:    Date:  07/03/2022   ID:  [name] Short, DOB 03/15/1946, MRN [id number]  PCP:  Midge Minium, MD  [facility] Cardiologist:  Donato Heinz, MD  [place] Electrophysiologist:  Vickie Epley, MD    Interval History:    [name] Short is a 76 y.o. male who presents for a follow up visit.  I previously saw the patient for evaluation watchman implant but prior to the implant he experienced a ST elevation myocardial infarction treated by Dr. Angelena Form.  The previous plan was to continue triple therapy for 1 month after his PCI and see him back today to discuss rescheduling his Watchman procedure.  I last saw the patient in clinic April 16, 2022.  The patient has a diagnosis of atrial fibrillation, previous stroke, hypertension, hyperlipidemia.  He experiences intermittent hematuria while on anticoagulation and is followed by urology.       Past Medical History:  Diagnosis Date   Arthritis    WRIST   Benign localized prostatic hyperplasia with lower urinary tract symptoms (LUTS)    Complication of anesthesia    slow to wake   ED (erectile dysfunction)    GERD (gastroesophageal reflux disease)    History of concussion    AS CHILD--  NO RESIDUAL   History of gout    History of kidney stones    Hyperlipidemia    Inguinal hernia, bilateral    Nephrolithiasis    BILATERAL   PAF (paroxysmal atrial fibrillation) ([place]) currently being followed by pcp   EPISODE --  2011  FOLLOW-UP W/ DR WALL  /  HAS NOT SEEN ANY CARDIOLOGIST SINCE    Past Surgical History:  Procedure Laterality Date   APPENDECTOMY  1983   CARDIOVASCULAR STRESS TEST  05-08-2010  DR WALL   NO EVIDENCE OF SCAR OR ISCHEMIA/ EF 54%/  PT HAD BOTH AFIB/ AFLUTTER DURING STUDY   CARPAL TUNNEL RELEASE Right 01/ 14/ 2021   CORONARY STENT INTERVENTION N/A 05/02/2022   Procedure: CORONARY STENT INTERVENTION;  Surgeon: Burnell Blanks, MD;   Location: [place] [facility];  Service: Cardiovascular;  Laterality: N/A;   CORONARY/GRAFT ACUTE MI REVASCULARIZATION N/A 05/01/2022   Procedure: Coronary/Graft Acute MI Revascularization;  Surgeon: Burnell Blanks, MD;  Location: [place] [facility];  Service: Cardiovascular;  Laterality: N/A;   CYSTOSCOPY W/ URETERAL STENT PLACEMENT Left 09/20/2013   Procedure: CYSTOSCOPY WITH STENT REPLACEMENT;  Surgeon: Fredricka Bonine, MD;  Location: [facility];  Service: Urology;  Laterality: Left;   CYSTOSCOPY WITH URETEROSCOPY Left 09/20/2013   Procedure: CYSTOSCOPY WITH URETEROSCOPY;  Surgeon: Fredricka Bonine, MD;  Location: [facility];  Service: Urology;  Laterality: Left;   CYSTOSCOPY WITH URETEROSCOPY AND STENT PLACEMENT Left 07/12/2013   Procedure: CYSTOSCOPY WITH LITHOLAPEXY, LEFT URETERAL STENT PLACEMENT;  Surgeon: Fredricka Bonine, MD;  Location: [facility];  Service: Urology;  Laterality: Left;   CYSTOSCOPY WITH URETEROSCOPY AND STENT PLACEMENT Bilateral 06/23/2014   Procedure: BILATERAL URETEROSCOPY, HOLMIUM LASER LITHOTRIPSY AND STENT PLACEMENT, RIGHT ;  Surgeon: Festus Aloe, MD;  Location: [facility];  Service: Urology;  Laterality: Bilateral;   EXTRACORPOREAL SHOCK WAVE LITHOTRIPSY  X2   EXTRACORPOREAL SHOCK WAVE LITHOTRIPSY Left 08-29-2013;   07-28-2013   EXTRACORPOREAL SHOCK WAVE LITHOTRIPSY Right 12/09/2018   Procedure: EXTRACORPOREAL SHOCK WAVE LITHOTRIPSY (ESWL);  Surgeon: Festus Aloe, MD;  Location: [facility];  Service: Urology;  Laterality:  Right;   HOLMIUM LASER APPLICATION Left 76/16/0737   Procedure: HOLMIUM LASER APPLICATION;  Surgeon: Fredricka Bonine, MD;  Location: [facility];  Service: Urology;  Laterality: Left;   INGUINAL HERNIA REPAIR Bilateral 10/16/2020   Procedure: BILATERAL OPEN INGUINAL HERNIA REPAIR WITH MESH;  Surgeon: Coralie Keens, MD;  Location:  [place];  Service: General;  Laterality: Bilateral;  LMA/TAP BLOCK   LAPAROSCOPIC INGUINAL HERNIA REPAIR Bilateral 09-04-2010   w/ mesh   LEFT HEART CATH AND CORONARY ANGIOGRAPHY N/A 05/01/2022   Procedure: LEFT HEART CATH AND CORONARY ANGIOGRAPHY;  Surgeon: Burnell Blanks, MD;  Location: [place] [facility];  Service: Cardiovascular;  Laterality: N/A;   TONSILLECTOMY  AS CHILD   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   TOTAL HIP ARTHROPLASTY Right 11/15/2012   Procedure: RIGHT TOTAL HIP ARTHROPLASTY ANTERIOR APPROACH;  Surgeon: Marybelle Killings, MD;  Location: [place];  Service: Orthopedics;  Laterality: Right;  Right total hip arthroplasty   TRANSTHORACIC ECHOCARDIOGRAM  05-08-2010   NORMAL LVSF/  EF 10%/  GRADE I DIASTOLIC DYSFUNCTION/  MILD BILATERAL ATRIUM ENLARGEMENT    Current Medications: No outpatient medications have been marked as taking for the 07/04/22 encounter (Office Visit) with Vickie Epley, MD.     Allergies:   Shellfish-derived products and Warfarin sodium   Social History   Socioeconomic History   Marital status: Married    Spouse name: Dawn   Number of children: Not on file   Years of education: Not on file   Highest education level: Not on file  Occupational History   Occupation: retired  Tobacco Use   Smoking status: Light Smoker    Types: Cigars   Smokeless tobacco: Never   Tobacco comments:    AVERAGE 3 CIGARS PER WEEK  Vaping Use   Vaping Use: Never used  Substance and Sexual Activity   Alcohol use: Not Currently    Comment: occasional   Drug use: No   Sexual activity: Not on file  Other Topics Concern   Not on file  Social History Narrative   Not on file   Social Determinants of Health   Financial Resource Strain: Low Risk  (05/08/2022)   Overall Financial Resource Strain (CARDIA)    Difficulty of Paying Living Expenses: Not hard at all  Food Insecurity: No Food Insecurity (05/22/2022)   Hunger Vital Sign    Worried  About Running Out of Food in the Last Year: Never true    [place] in the Last Year: Never true  Transportation Needs: No Transportation Needs (05/22/2022)   PRAPARE - Hydrologist (Medical): No    Lack of Transportation (Non-Medical): No  Physical Activity: Inactive (05/08/2022)   Exercise Vital Sign    Days of Exercise per Week: 0 days    Minutes of Exercise per Session: 0 min  Stress: No Stress Concern Present (05/08/2022)   [place]    Feeling of Stress : Not at all  Social Connections: Moderately Isolated (05/08/2022)   Social Connection and Isolation Panel [NHANES]    Frequency of Communication with Friends and Family: Three times a week    Frequency of Social Gatherings with Friends and Family: Three times a week    Attends Religious Services: Never    Active Member of Clubs or Organizations: No    Attends Archivist Meetings: Never    Marital Status: Married  Family History: The patient's family history includes Arthritis in his mother; Bipolar disorder in his brother; Dementia in his brother and mother; Diabetes in his brother, maternal grandfather, maternal grandmother, mother, and sister; Heart disease in his father; Neuropathy in his brother; Prostate cancer in his father; Rheum arthritis in his mother.  ROS:   Please see the history of present illness.    All other systems reviewed and are negative.  EKGs/Labs/Other Studies Reviewed:    The following studies were reviewed today:  May 02, 2022 heart catheterization Severe serial mid LAD stenoses Successful PTCA/DES x 1 mid LAD  Recommendations: Continue DAPT with ASA/Plavix for one month. Since he will be on Eliquis, ok to stop ASA in one month. Resume Eliquis tomorrow morning.      Recent Labs: 05/01/2022: ALT 18; B Natriuretic Peptide 64.5; TSH 2.377 05/03/2022: BUN 8; Creatinine, Ser 0.96; Hemoglobin  12.8; Platelets 155; Potassium 3.7; Sodium 135  Recent Lipid Panel    Component Value Date/Time   CHOL 104 05/01/2022 0209   TRIG 62 05/01/2022 0209   HDL 44 05/01/2022 0209   CHOLHDL 2.4 05/01/2022 0209   VLDL 12 05/01/2022 0209   LDLCALC 48 05/01/2022 0209   LDLDIRECT 128.1 05/17/2012 0901    Physical Exam:    VS:  There were no vitals taken for this visit.    Wt Readings from Last 3 Encounters:  06/04/22 170 lb (77.1 kg)  05/16/22 172 lb (78 kg)  05/12/22 168 lb 12.8 oz (76.6 kg)     GEN: *** Well nourished, well developed in no acute distress HEENT: Normal NECK: No JVD; No carotid bruits LYMPHATICS: No lymphadenopathy CARDIAC: ***RRR, no murmurs, rubs, gallops RESPIRATORY:  Clear to auscultation without rales, wheezing or rhonchi  ABDOMEN: Soft, non-tender, non-distended MUSCULOSKELETAL:  No edema; No deformity  SKIN: Warm and dry NEUROLOGIC:  Alert and oriented x 3 PSYCHIATRIC:  Normal affect        ASSESSMENT:    1. Paroxysmal A-fib ([place])   2. Cerebrovascular accident (CVA) due to embolism of right middle cerebral artery ([place])   3. ST elevation myocardial infarction involving right coronary artery (HCC)    PLAN:    In order of problems listed above:   #Paroxysmal atrial fibrillation  ***Confirm watchman only  -------------  I have seen [name] Short in the office today who is being considered for a Watchman left atrial appendage closure device. I believe they will benefit from this procedure given their history of atrial fibrillation, CHA2DS2-VASc score of 6 and unadjusted ischemic stroke rate of 9.7% per year. Unfortunately, the patient is not felt to be a long term anticoagulation candidate secondary to hematuria. The patient's chart has been reviewed and I feel that they would be a candidate for short term oral anticoagulation after Watchman implant.   It is my belief that after undergoing a LAA closure procedure, [name] Short will not  need long term anticoagulation which eliminates anticoagulation side effects and major bleeding risk.   Procedural risks for the Watchman implant have been reviewed with the patient including a 0.5% risk of stroke, <1% risk of perforation and <1% risk of device embolization. Other risks include bleeding, vascular damage, tamponade, worsening renal function, and death. The patient understands these risk and wishes to proceed.     The published clinical data on the safety and effectiveness of WATCHMAN include but are not limited to the following: - Holmes DR, Mechele Claude, Sick P et al. for the PROTECT  AF Investigators. Percutaneous closure of the left atrial appendage versus warfarin therapy for prevention of stroke in patients with atrial fibrillation: a randomised non-inferiority trial. Lancet 2009; 374: 534-42. Mechele Claude, Doshi SK, Abelardo Diesel D et al. on behalf of the PROTECT AF Investigators. Percutaneous Left Atrial Appendage Closure for Stroke Prophylaxis in Patients With Atrial Fibrillation 2.3-Year Follow-up of the PROTECT AF (Watchman Left Atrial Appendage System for Embolic Protection in Patients With Atrial Fibrillation) Trial. Circulation 2013; 127:720-729. - Alli O, Doshi S,  Kar S, Reddy VY, Sievert H et al. Quality of Life Assessment in the Randomized PROTECT AF (Percutaneous Closure of the Left Atrial Appendage Versus Warfarin Therapy for Prevention of Stroke in Patients With Atrial Fibrillation) Trial of Patients at Risk for Stroke With Nonvalvular Atrial Fibrillation. J Am Coll Cardiol 2013; P4788364. Vertell Limber DR, Tarri Abernethy, Price M, [place], Sievert H, Doshi S, Huber K, Reddy V. Prospective randomized evaluation of the Watchman left atrial appendage Device in patients with atrial fibrillation versus long-term warfarin therapy; the PREVAIL trial. Journal of the [employer] of Cardiology, Vol. 4, No. 1, 2014, 1-11. - Kar S, Doshi SK, Sadhu A, Horton R, Osorio J et al. Primary  outcome evaluation of a next-generation left atrial appendage closure device: results from the PINNACLE FLX trial. Circulation 2021;143(18)1754-1762.    After today's visit with the patient which was dedicated solely for shared decision making visit regarding LAA closure device, the patient decided to proceed with the LAA appendage closure procedure scheduled to be done in the near future at [facility]. Prior to the procedure, I would like to obtain a gated CT scan of the chest with contrast timed for PV/LA visualization.    HAS-BLED score 5 Hypertension Yes  Abnormal renal and liver function (Dialysis, transplant, Cr >2.26 mg/dL /Cirrhosis or Bilirubin >2x Normal or AST/ALT/AP >3x Normal) No  Stroke Yes  Bleeding Yes  Labile INR (Unstable/high INR) No  Elderly (>65) Yes  Drugs or alcohol (? 8 drinks/week, anti-plt or NSAID) Yes   CHA2DS2-VASc Score = 6  The patient's score is based upon: CHF History: 1 HTN History: 1 Diabetes History: 0 Stroke History: 2 Vascular Disease History: 1 Age Score: 1 Gender Score: 0   Medication Adjustments/Labs and Tests Ordered: Current medicines are reviewed at length with the patient today.  Concerns regarding medicines are outlined above.  No orders of the defined types were placed in this encounter.  No orders of the defined types were placed in this encounter.    Signed, Lars Mage, MD, [facility], [facility] 07/03/2022 9:26 PM    Electrophysiology  [facility]

## 2022-07-04 ENCOUNTER — Ambulatory Visit: Payer: Medicare Other | Attending: Cardiology | Admitting: Cardiology

## 2022-07-04 ENCOUNTER — Encounter: Payer: Self-pay | Admitting: Cardiology

## 2022-07-04 ENCOUNTER — Encounter: Payer: Self-pay | Admitting: *Deleted

## 2022-07-04 VITALS — BP 124/70 | HR 55 | Ht 67.0 in | Wt 177.2 lb

## 2022-07-04 DIAGNOSIS — I480 Paroxysmal atrial fibrillation: Secondary | ICD-10-CM | POA: Diagnosis not present

## 2022-07-04 DIAGNOSIS — I2111 ST elevation (STEMI) myocardial infarction involving right coronary artery: Secondary | ICD-10-CM

## 2022-07-04 DIAGNOSIS — I63411 Cerebral infarction due to embolism of right middle cerebral artery: Secondary | ICD-10-CM | POA: Diagnosis not present

## 2022-07-04 NOTE — Patient Instructions (Signed)
Medication Instructions:  none *If you need a refill on your cardiac medications before your next appointment, please call your pharmacy*   Lab Work: BMP  If you have labs (blood work) drawn today and your tests are completely normal, you will receive your results only by: [place] (if you have MyChart) OR A paper copy in the mail If you have any lab test that is abnormal or we need to change your treatment, we will call you to review the results.   Testing/Procedures: Your physician has requested that you have cardiac CT. Cardiac computed tomography (CT) is a painless test that uses an x-ray machine to take clear, detailed pictures of your heart. For further information please visit [email or web address]. Please follow instruction sheet as given.     Follow-Up: At [facility], you and your health needs are our priority.  As part of our continuing mission to provide you with exceptional heart care, we have created designated Provider Care Teams.  These Care Teams include your primary Cardiologist (physician) and Advanced Practice Providers (APPs -  Physician Assistants and Nurse Practitioners) who all work together to provide you with the care you need, when you need it.  We recommend signing up for the patient portal called "MyChart".  Sign up information is provided on this After Visit Summary.  MyChart is used to connect with patients for Virtual Visits (Telemedicine).  Patients are able to view lab/test results, encounter notes, upcoming appointments, etc.  Non-urgent messages can be sent to your provider as well.   To learn more about what you can do with MyChart, go to [email or web address].    Your next appointment:   Lenice Llamas, the Watchman Nurse Navigator, will call you after your CT once the [facility] Team has reviewed your imaging for an update on proceedings. [place]'s direct number is [phone] if you need assistance.   Important Information About  [place]

## 2022-07-04 NOTE — Progress Notes (Signed)
CT

## 2022-07-04 NOTE — Progress Notes (Signed)
Electrophysiology Office Follow up Visit Note:    Date:  07/04/2022   ID:  [name] Short, DOB 10/31/1945, MRN [id number]  PCP:  Midge Minium, MD  [facility] Cardiologist:  Donato Heinz, MD  [place] Electrophysiologist:  Vickie Epley, MD    Interval History:    [name] Short is a 76 y.o. male who presents for a follow up visit.  I previously saw the patient for evaluation watchman implant but prior to the implant he experienced a ST elevation myocardial infarction treated by Dr. Angelena Form.  The previous plan was to continue triple therapy for 1 month after his PCI and see him back today to discuss rescheduling his Watchman procedure.  I last saw the patient in clinic April 16, 2022.  The patient has a diagnosis of atrial fibrillation, previous stroke, hypertension, hyperlipidemia.  He experiences intermittent hematuria while on anticoagulation and is followed by urology.  Today, patient states that he is still experiencing stroke deficits but these have progressively improved. He denies any particular complaints of chest discomfort or shortness of breath.  He has discontinued his ASA. He is still taking Plavix and Eliquis. He would like to proceed with Watchman device. He is scheduled for cardiac CT on 07/08/22.     Past Medical History:  Diagnosis Date   Arthritis    WRIST   Benign localized prostatic hyperplasia with lower urinary tract symptoms (LUTS)    Complication of anesthesia    slow to wake   ED (erectile dysfunction)    GERD (gastroesophageal reflux disease)    History of concussion    AS CHILD--  NO RESIDUAL   History of gout    History of kidney stones    Hyperlipidemia    Inguinal hernia, bilateral    Nephrolithiasis    BILATERAL   PAF (paroxysmal atrial fibrillation) ([place]) currently being followed by pcp   EPISODE --  2011  FOLLOW-UP W/ DR WALL  /  HAS NOT SEEN ANY CARDIOLOGIST SINCE    Past Surgical History:   Procedure Laterality Date   APPENDECTOMY  1983   CARDIOVASCULAR STRESS TEST  05-08-2010  DR WALL   NO EVIDENCE OF SCAR OR ISCHEMIA/ EF 54%/  PT HAD BOTH AFIB/ AFLUTTER DURING STUDY   CARPAL TUNNEL RELEASE Right 01/ 14/ 2021   CORONARY STENT INTERVENTION N/A 05/02/2022   Procedure: CORONARY STENT INTERVENTION;  Surgeon: Burnell Blanks, MD;  Location: [place] [facility];  Service: Cardiovascular;  Laterality: N/A;   CORONARY/GRAFT ACUTE MI REVASCULARIZATION N/A 05/01/2022   Procedure: Coronary/Graft Acute MI Revascularization;  Surgeon: Burnell Blanks, MD;  Location: [place] [facility];  Service: Cardiovascular;  Laterality: N/A;   CYSTOSCOPY W/ URETERAL STENT PLACEMENT Left 09/20/2013   Procedure: CYSTOSCOPY WITH STENT REPLACEMENT;  Surgeon: Fredricka Bonine, MD;  Location: [facility];  Service: Urology;  Laterality: Left;   CYSTOSCOPY WITH URETEROSCOPY Left 09/20/2013   Procedure: CYSTOSCOPY WITH URETEROSCOPY;  Surgeon: Fredricka Bonine, MD;  Location: [facility];  Service: Urology;  Laterality: Left;   CYSTOSCOPY WITH URETEROSCOPY AND STENT PLACEMENT Left 07/12/2013   Procedure: CYSTOSCOPY WITH LITHOLAPEXY, LEFT URETERAL STENT PLACEMENT;  Surgeon: Fredricka Bonine, MD;  Location: [facility];  Service: Urology;  Laterality: Left;   CYSTOSCOPY WITH URETEROSCOPY AND STENT PLACEMENT Bilateral 06/23/2014   Procedure: BILATERAL URETEROSCOPY, HOLMIUM LASER LITHOTRIPSY AND STENT PLACEMENT, RIGHT ;  Surgeon: Festus Aloe, MD;  Location: [facility];  Service: Urology;  Laterality: Bilateral;   EXTRACORPOREAL SHOCK WAVE LITHOTRIPSY  X2   EXTRACORPOREAL SHOCK WAVE LITHOTRIPSY Left 08-29-2013;   07-28-2013   EXTRACORPOREAL SHOCK WAVE LITHOTRIPSY Right 12/09/2018   Procedure: EXTRACORPOREAL SHOCK WAVE LITHOTRIPSY (ESWL);  Surgeon: Jerilee Field, MD;  Location: [facility];  Service: Urology;  Laterality:  Right;   HOLMIUM LASER APPLICATION Left 09/20/2013   Procedure: HOLMIUM LASER APPLICATION;  Surgeon: Antony Haste, MD;  Location: [facility];  Service: Urology;  Laterality: Left;   INGUINAL HERNIA REPAIR Bilateral 10/16/2020   Procedure: BILATERAL OPEN INGUINAL HERNIA REPAIR WITH MESH;  Surgeon: Abigail Miyamoto, MD;  Location: [facility] [place];  Service: General;  Laterality: Bilateral;  LMA/TAP BLOCK   LAPAROSCOPIC INGUINAL HERNIA REPAIR Bilateral 09-04-2010   w/ mesh   LEFT HEART CATH AND CORONARY ANGIOGRAPHY N/A 05/01/2022   Procedure: LEFT HEART CATH AND CORONARY ANGIOGRAPHY;  Surgeon: Kathleene Hazel, MD;  Location: [facility];  Service: Cardiovascular;  Laterality: N/A;   TONSILLECTOMY  AS CHILD   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   TOTAL HIP ARTHROPLASTY Right 11/15/2012   Procedure: RIGHT TOTAL HIP ARTHROPLASTY ANTERIOR APPROACH;  Surgeon: Eldred Manges, MD;  Location: [facility];  Service: Orthopedics;  Laterality: Right;  Right total hip arthroplasty   TRANSTHORACIC ECHOCARDIOGRAM  05-08-2010   NORMAL LVSF/  EF 55%/  GRADE I DIASTOLIC DYSFUNCTION/  MILD BILATERAL ATRIUM ENLARGEMENT    Current Medications: Current Meds  Medication Sig   acetaminophen (TYLENOL) 500 MG tablet Take 1,000 mg by mouth every 6 (six) hours as needed for moderate pain.   allopurinol (ZYLOPRIM) 100 MG tablet Take 2 tablets (200 mg total) by mouth daily.   apixaban (ELIQUIS) 5 MG TABS tablet Take 1 tablet (5 mg total) by mouth 2 (two) times daily.   clopidogrel (PLAVIX) 75 MG tablet Take 1 tablet (75 mg total) by mouth daily.   colchicine 0.6 MG tablet Take 0.6 mg by mouth daily as needed.   metoprolol tartrate (LOPRESSOR) 25 MG tablet Take 1 tablet (25 mg total) by mouth 2 (two) times daily.   Multiple Vitamin (MULTIVITAMIN WITH MINERALS) TABS Take 1 tablet by mouth daily.   nitroGLYCERIN (NITROSTAT) 0.4 MG SL tablet Place 1 tablet (0.4 mg total) under the  tongue every 5 (five) minutes as needed.   pantoprazole (PROTONIX) 40 MG tablet Take 1 tablet (40 mg total) by mouth daily.   Polyethylene Glycol 400 (BLINK TEARS) 0.25 % SOLN Place 1 drop into both eyes 2 (two) times daily as needed (for dryness or irritation).   rosuvastatin (CRESTOR) 40 MG tablet Take 1 tablet (40 mg total) by mouth daily.   sodium chloride (OCEAN) 0.65 % SOLN nasal spray Place 1 spray into both nostrils as needed for congestion.   tadalafil (CIALIS) 5 MG tablet Take 5 mg by mouth daily as needed for erectile dysfunction.   trolamine salicylate (ASPERCREME) 10 % cream Apply 1 Application topically as needed for muscle pain ([place]).     Allergies:   Shellfish-derived products and Warfarin sodium   Social History   Socioeconomic History   Marital status: Married    Spouse name: Dawn   Number of children: Not on file   Years of education: Not on file   Highest education level: Not on file  Occupational History   Occupation: retired  Tobacco Use   Smoking status: Light Smoker    Types: Cigars   Smokeless tobacco: Never   Tobacco comments:  AVERAGE 3 CIGARS PER WEEK  Vaping Use   Vaping Use: Never used  Substance and Sexual Activity   Alcohol use: Not Currently    Comment: occasional   Drug use: No   Sexual activity: Not on file  Other Topics Concern   Not on file  Social History Narrative   Not on file   Social Determinants of Health   Financial Resource Strain: Low Risk  (05/08/2022)   Overall Financial Resource Strain (CARDIA)    Difficulty of Paying Living Expenses: Not hard at all  Food Insecurity: No Food Insecurity (05/22/2022)   Hunger Vital Sign    Worried About Running Out of Food in the Last Year: Never true    Ran Out of Food in the Last Year: Never true  Transportation Needs: No Transportation Needs (05/22/2022)   PRAPARE - Administrator, Civil Service (Medical): No    Lack of Transportation (Non-Medical): No  Physical  Activity: Inactive (05/08/2022)   Exercise Vital Sign    Days of Exercise per Week: 0 days    Minutes of Exercise per Session: 0 min  Stress: No Stress Concern Present (05/08/2022)   [employer] of Occupational Health - Occupational Stress Questionnaire    Feeling of Stress : Not at all  Social Connections: Moderately Isolated (05/08/2022)   Social Connection and Isolation Panel [NHANES]    Frequency of Communication with Friends and Family: Three times a week    Frequency of Social Gatherings with Friends and Family: Three times a week    Attends Religious Services: Never    Active Member of Clubs or Organizations: No    Attends Engineer, structural: Never    Marital Status: Married     Family History: The patient's family history includes Arthritis in his mother; Bipolar disorder in his brother; Dementia in his brother and mother; Diabetes in his brother, maternal grandfather, maternal grandmother, mother, and sister; Heart disease in his father; Neuropathy in his brother; Prostate cancer in his father; Rheum arthritis in his mother.  ROS:   Please see the history of present illness.    All other systems reviewed and are negative.  EKGs/Labs/Other Studies Reviewed:    The following studies were reviewed today:  May 02, 2022 heart catheterization Severe serial mid LAD stenoses Successful PTCA/DES x 1 mid LAD  Recommendations: Continue DAPT with ASA/Plavix for one month. Since he will be on Eliquis, ok to stop ASA in one month. Resume Eliquis tomorrow morning.      Recent Labs: 05/01/2022: ALT 18; B Natriuretic Peptide 64.5; TSH 2.377 05/03/2022: BUN 8; Creatinine, Ser 0.96; Hemoglobin 12.8; Platelets 155; Potassium 3.7; Sodium 135  Recent Lipid Panel    Component Value Date/Time   CHOL 104 05/01/2022 0209   TRIG 62 05/01/2022 0209   HDL 44 05/01/2022 0209   CHOLHDL 2.4 05/01/2022 0209   VLDL 12 05/01/2022 0209   LDLCALC 48 05/01/2022 0209   LDLDIRECT 128.1  05/17/2012 0901    Physical Exam:    VS:  BP 124/70   Pulse (!) 55   Ht 5\' 7"  (1.702 m)   Wt 177 lb 3.2 oz (80.4 kg)   SpO2 96%   BMI 27.75 kg/m     Wt Readings from Last 3 Encounters:  07/04/22 177 lb 3.2 oz (80.4 kg)  06/04/22 170 lb (77.1 kg)  05/16/22 172 lb (78 kg)     GEN:  Well nourished, well developed in no acute distress HEENT: Normal  NECK: No JVD; No carotid bruits LYMPHATICS: No lymphadenopathy CARDIAC: RRR, no murmurs, rubs, gallops RESPIRATORY:  Clear to auscultation without rales, wheezing or rhonchi  ABDOMEN: Soft, non-tender, non-distended MUSCULOSKELETAL:  No edema; No deformity  SKIN: Warm and dry NEUROLOGIC:  Alert and oriented x 3 PSYCHIATRIC:  Normal affect      ASSESSMENT:    1. Paroxysmal A-fib (HCC)   2. Cerebrovascular accident (CVA) due to embolism of right middle cerebral artery (HCC)   3. ST elevation myocardial infarction involving right coronary artery (HCC)    PLAN:    In order of problems listed above:   #Paroxysmal atrial fibrillation  I have seen [name] Short in the office today who is being considered for a Watchman left atrial appendage closure device. I believe they will benefit from this procedure given their history of atrial fibrillation, CHA2DS2-VASc score of 6 and unadjusted ischemic stroke rate of 9.7% per year. Unfortunately, the patient is not felt to be a long term anticoagulation candidate secondary to hematuria. The patient's chart has been reviewed and I feel that they would be a candidate for short term oral anticoagulation after Watchman implant.   It is my belief that after undergoing a LAA closure procedure, [name] Short will not need long term anticoagulation which eliminates anticoagulation side effects and major bleeding risk.   Procedural risks for the Watchman implant have been reviewed with the patient including a 0.5% risk of stroke, <1% risk of perforation and <1% risk of device embolization.  Other risks include bleeding, vascular damage, tamponade, worsening renal function, and death. The patient understands these risk and wishes to proceed.     The published clinical data on the safety and effectiveness of WATCHMAN include but are not limited to the following: - Holmes DR, Everlene Farrier, Sick P et al. for the PROTECT AF Investigators. Percutaneous closure of the left atrial appendage versus warfarin therapy for prevention of stroke in patients with atrial fibrillation: a randomised non-inferiority trial. Lancet 2009; 374: 534-42. Everlene Farrier, Doshi SK, Isa Rankin D et al. on behalf of the PROTECT AF Investigators. Percutaneous Left Atrial Appendage Closure for Stroke Prophylaxis in Patients With Atrial Fibrillation 2.3-Year Follow-up of the PROTECT AF (Watchman Left Atrial Appendage System for Embolic Protection in Patients With Atrial Fibrillation) Trial. Circulation 2013; 127:720-729. - Alli O, Doshi S,  Kar S, Reddy VY, Sievert H et al. Quality of Life Assessment in the Randomized PROTECT AF (Percutaneous Closure of the Left Atrial Appendage Versus Warfarin Therapy for Prevention of Stroke in Patients With Atrial Fibrillation) Trial of Patients at Risk for Stroke With Nonvalvular Atrial Fibrillation. J Am Coll Cardiol 2013; 61:1790-8. Aline August DR, [place]ia [place], Price M, Whisenant B, Sievert H, Doshi S, Huber K, Reddy V. Prospective randomized evaluation of the Watchman left atrial appendage Device in patients with atrial fibrillation versus long-term warfarin therapy; the PREVAIL trial. Journal of the [employer] of Cardiology, Vol. 4, No. 1, 2014, 1-11. - Kar S, Doshi SK, Sadhu A, Horton R, Osorio J et al. Primary outcome evaluation of a next-generation left atrial appendage closure device: results from the PINNACLE FLX trial. Circulation 2021;143(18)1754-1762.    After today's visit with the patient which was dedicated solely for shared decision making visit regarding LAA closure device,  the patient decided to proceed with the LAA appendage closure procedure scheduled to be done in the near future at [facility]. Prior to the procedure, I would like to obtain a gated  CT scan of the chest with contrast timed for PV/LA visualization.    HAS-BLED score 5 Hypertension Yes  Abnormal renal and liver function (Dialysis, transplant, Cr >2.26 mg/dL /Cirrhosis or Bilirubin >2x Normal or AST/ALT/AP >3x Normal) No  Stroke Yes  Bleeding Yes  Labile INR (Unstable/high INR) No  Elderly (>65) Yes  Drugs or alcohol (? 8 drinks/week, anti-plt or NSAID) Yes   CHA2DS2-VASc Score = 6  The patient's score is based upon: CHF History: 1 HTN History: 1 Diabetes History: 0 Stroke History: 2 Vascular Disease History: 1 Age Score: 1 Gender Score: 0   Plan for Eliquis/Plavix in the peri-implant period. Plan to stop Eliquis 45 days after Watchman implant and continue Plavix for his CAD indication.    #CAD #STEMI Doing well. No chest pain.     Medication Adjustments/Labs and Tests Ordered: Current medicines are reviewed at length with the patient today.  Concerns regarding medicines are outlined above.  No orders of the defined types were placed in this encounter.  No orders of the defined types were placed in this encounter.  This document serves as a record of services personally performed by Steffanie Dunnameron Draco Malczewski, MD. It was created on her behalf by Pura SpiceAlexis Herring, a trained medical scribe. The creation of this record is based on the scribe's personal observations and the provider's statements to them. This document has been checked and approved by the attending provider.  I, Lanier PrudeAMERON T Malone Admire, MD, have reviewed all documentation for this visit. The documentation on 07/04/22 for the exam, diagnosis, procedures, and orders are all accurate and complete.    Signed, Steffanie Dunnameron Keshawna Dix, MD, [facility] Plaza Doctors HospitalFACC, [facility] HospitalFHRS 07/04/2022 11:27 AM    Electrophysiology [facility]

## 2022-07-04 NOTE — H&P (View-Only) (Signed)
Electrophysiology Office Follow up Visit Note:    Date:  07/04/2022   ID:  [name] Short, DOB 10/31/1945, MRN [id number]  PCP:  [name] Minium, MD  [facility] Cardiologist:  Donato Heinz, MD  [place] Electrophysiologist:  Vickie Epley, MD    Interval History:    [name] Short is a 76 y.o. male who presents for a follow up visit.  I previously saw the patient for evaluation watchman implant but prior to the implant he experienced a ST elevation myocardial infarction treated by Dr. Angelena Form.  The previous plan was to continue triple therapy for 1 month after his PCI and see him back today to discuss rescheduling his Watchman procedure.  I last saw the patient in clinic April 16, 2022.  The patient has a diagnosis of atrial fibrillation, previous stroke, hypertension, hyperlipidemia.  He experiences intermittent hematuria while on anticoagulation and is followed by urology.  Today, patient states that he is still experiencing stroke deficits but these have progressively improved. He denies any particular complaints of chest discomfort or shortness of breath.  He has discontinued his ASA. He is still taking Plavix and Eliquis. He would like to proceed with Watchman device. He is scheduled for cardiac CT on 07/08/22.     Past Medical History:  Diagnosis Date   Arthritis    WRIST   Benign localized prostatic hyperplasia with lower urinary tract symptoms (LUTS)    Complication of anesthesia    slow to wake   ED (erectile dysfunction)    GERD (gastroesophageal reflux disease)    History of concussion    AS CHILD--  NO RESIDUAL   History of gout    History of kidney stones    Hyperlipidemia    Inguinal hernia, bilateral    Nephrolithiasis    BILATERAL   PAF (paroxysmal atrial fibrillation) ([place]) currently being followed by pcp   EPISODE --  2011  FOLLOW-UP W/ DR WALL  /  HAS NOT SEEN ANY CARDIOLOGIST SINCE    Past Surgical History:   Procedure Laterality Date   APPENDECTOMY  1983   CARDIOVASCULAR STRESS TEST  05-08-2010  DR WALL   NO EVIDENCE OF SCAR OR ISCHEMIA/ EF 54%/  PT HAD BOTH AFIB/ AFLUTTER DURING STUDY   CARPAL TUNNEL RELEASE Right 01/ 14/ 2021   CORONARY STENT INTERVENTION N/A 05/02/2022   Procedure: CORONARY STENT INTERVENTION;  Surgeon: Burnell Blanks, MD;  Location: [place] [facility];  Service: Cardiovascular;  Laterality: N/A;   CORONARY/GRAFT ACUTE MI REVASCULARIZATION N/A 05/01/2022   Procedure: Coronary/Graft Acute MI Revascularization;  Surgeon: Burnell Blanks, MD;  Location: [place] [facility];  Service: Cardiovascular;  Laterality: N/A;   CYSTOSCOPY W/ URETERAL STENT PLACEMENT Left 09/20/2013   Procedure: CYSTOSCOPY WITH STENT REPLACEMENT;  Surgeon: Fredricka Bonine, MD;  Location: [facility];  Service: Urology;  Laterality: Left;   CYSTOSCOPY WITH URETEROSCOPY Left 09/20/2013   Procedure: CYSTOSCOPY WITH URETEROSCOPY;  Surgeon: Fredricka Bonine, MD;  Location: [facility];  Service: Urology;  Laterality: Left;   CYSTOSCOPY WITH URETEROSCOPY AND STENT PLACEMENT Left 07/12/2013   Procedure: CYSTOSCOPY WITH LITHOLAPEXY, LEFT URETERAL STENT PLACEMENT;  Surgeon: Fredricka Bonine, MD;  Location: [facility];  Service: Urology;  Laterality: Left;   CYSTOSCOPY WITH URETEROSCOPY AND STENT PLACEMENT Bilateral 06/23/2014   Procedure: BILATERAL URETEROSCOPY, HOLMIUM LASER LITHOTRIPSY AND STENT PLACEMENT, RIGHT ;  Surgeon: Festus Aloe, MD;  Location: [facility];  Service: Urology;  Laterality: Bilateral;   EXTRACORPOREAL SHOCK WAVE LITHOTRIPSY  X2   EXTRACORPOREAL SHOCK WAVE LITHOTRIPSY Left 08-29-2013;   07-28-2013   EXTRACORPOREAL SHOCK WAVE LITHOTRIPSY Right 12/09/2018   Procedure: EXTRACORPOREAL SHOCK WAVE LITHOTRIPSY (ESWL);  Surgeon: Jerilee Field, MD;  Location: [facility];  Service: Urology;  Laterality:  Right;   HOLMIUM LASER APPLICATION Left 09/20/2013   Procedure: HOLMIUM LASER APPLICATION;  Surgeon: Antony Haste, MD;  Location: [facility];  Service: Urology;  Laterality: Left;   INGUINAL HERNIA REPAIR Bilateral 10/16/2020   Procedure: BILATERAL OPEN INGUINAL HERNIA REPAIR WITH MESH;  Surgeon: Abigail Miyamoto, MD;  Location: [facility] [place];  Service: General;  Laterality: Bilateral;  LMA/TAP BLOCK   LAPAROSCOPIC INGUINAL HERNIA REPAIR Bilateral 09-04-2010   w/ mesh   LEFT HEART CATH AND CORONARY ANGIOGRAPHY N/A 05/01/2022   Procedure: LEFT HEART CATH AND CORONARY ANGIOGRAPHY;  Surgeon: Kathleene Hazel, MD;  Location: [facility];  Service: Cardiovascular;  Laterality: N/A;   TONSILLECTOMY  AS CHILD   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   TOTAL HIP ARTHROPLASTY Right 11/15/2012   Procedure: RIGHT TOTAL HIP ARTHROPLASTY ANTERIOR APPROACH;  Surgeon: Eldred Manges, MD;  Location: [facility];  Service: Orthopedics;  Laterality: Right;  Right total hip arthroplasty   TRANSTHORACIC ECHOCARDIOGRAM  05-08-2010   NORMAL LVSF/  EF 55%/  GRADE I DIASTOLIC DYSFUNCTION/  MILD BILATERAL ATRIUM ENLARGEMENT    Current Medications: Current Meds  Medication Sig   acetaminophen (TYLENOL) 500 MG tablet Take 1,000 mg by mouth every 6 (six) hours as needed for moderate pain.   allopurinol (ZYLOPRIM) 100 MG tablet Take 2 tablets (200 mg total) by mouth daily.   apixaban (ELIQUIS) 5 MG TABS tablet Take 1 tablet (5 mg total) by mouth 2 (two) times daily.   clopidogrel (PLAVIX) 75 MG tablet Take 1 tablet (75 mg total) by mouth daily.   colchicine 0.6 MG tablet Take 0.6 mg by mouth daily as needed.   metoprolol tartrate (LOPRESSOR) 25 MG tablet Take 1 tablet (25 mg total) by mouth 2 (two) times daily.   Multiple Vitamin (MULTIVITAMIN WITH MINERALS) TABS Take 1 tablet by mouth daily.   nitroGLYCERIN (NITROSTAT) 0.4 MG SL tablet Place 1 tablet (0.4 mg total) under the  tongue every 5 (five) minutes as needed.   pantoprazole (PROTONIX) 40 MG tablet Take 1 tablet (40 mg total) by mouth daily.   Polyethylene Glycol 400 (BLINK TEARS) 0.25 % SOLN Place 1 drop into both eyes 2 (two) times daily as needed (for dryness or irritation).   rosuvastatin (CRESTOR) 40 MG tablet Take 1 tablet (40 mg total) by mouth daily.   sodium chloride (OCEAN) 0.65 % SOLN nasal spray Place 1 spray into both nostrils as needed for congestion.   tadalafil (CIALIS) 5 MG tablet Take 5 mg by mouth daily as needed for erectile dysfunction.   trolamine salicylate (ASPERCREME) 10 % cream Apply 1 Application topically as needed for muscle pain ([place]).     Allergies:   Shellfish-derived products and Warfarin sodium   Social History   Socioeconomic History   Marital status: Married    Spouse name: Dawn   Number of children: Not on file   Years of education: Not on file   Highest education level: Not on file  Occupational History   Occupation: retired  Tobacco Use   Smoking status: Light Smoker    Types: Cigars   Smokeless tobacco: Never   Tobacco comments:  AVERAGE 3 CIGARS PER WEEK  Vaping Use   Vaping Use: Never used  Substance and Sexual Activity   Alcohol use: Not Currently    Comment: occasional   Drug use: No   Sexual activity: Not on file  Other Topics Concern   Not on file  Social History Narrative   Not on file   Social Determinants of Health   Financial Resource Strain: Low Risk  (05/08/2022)   Overall Financial Resource Strain (CARDIA)    Difficulty of Paying Living Expenses: Not hard at all  Food Insecurity: No Food Insecurity (05/22/2022)   Hunger Vital Sign    Worried About Running Out of Food in the Last Year: Never true    Ran Out of Food in the Last Year: Never true  Transportation Needs: No Transportation Needs (05/22/2022)   PRAPARE - Administrator, Civil Service (Medical): No    Lack of Transportation (Non-Medical): No  Physical  Activity: Inactive (05/08/2022)   Exercise Vital Sign    Days of Exercise per Week: 0 days    Minutes of Exercise per Session: 0 min  Stress: No Stress Concern Present (05/08/2022)   [employer] of Occupational Health - Occupational Stress Questionnaire    Feeling of Stress : Not at all  Social Connections: Moderately Isolated (05/08/2022)   Social Connection and Isolation Panel [NHANES]    Frequency of Communication with Friends and Family: Three times a week    Frequency of Social Gatherings with Friends and Family: Three times a week    Attends Religious Services: Never    Active Member of Clubs or Organizations: No    Attends Engineer, structural: Never    Marital Status: Married     Family History: The patient's family history includes Arthritis in his mother; Bipolar disorder in his brother; Dementia in his brother and mother; Diabetes in his brother, maternal grandfather, maternal grandmother, mother, and sister; Heart disease in his father; Neuropathy in his brother; Prostate cancer in his father; Rheum arthritis in his mother.  ROS:   Please see the history of present illness.    All other systems reviewed and are negative.  EKGs/Labs/Other Studies Reviewed:    The following studies were reviewed today:  May 02, 2022 heart catheterization Severe serial mid LAD stenoses Successful PTCA/DES x 1 mid LAD  Recommendations: Continue DAPT with ASA/Plavix for one month. Since he will be on Eliquis, ok to stop ASA in one month. Resume Eliquis tomorrow morning.      Recent Labs: 05/01/2022: ALT 18; B Natriuretic Peptide 64.5; TSH 2.377 05/03/2022: BUN 8; Creatinine, Ser 0.96; Hemoglobin 12.8; Platelets 155; Potassium 3.7; Sodium 135  Recent Lipid Panel    Component Value Date/Time   CHOL 104 05/01/2022 0209   TRIG 62 05/01/2022 0209   HDL 44 05/01/2022 0209   CHOLHDL 2.4 05/01/2022 0209   VLDL 12 05/01/2022 0209   LDLCALC 48 05/01/2022 0209   LDLDIRECT 128.1  05/17/2012 0901    Physical Exam:    VS:  BP 124/70   Pulse (!) 55   Ht 5\' 7"  (1.702 m)   Wt 177 lb 3.2 oz (80.4 kg)   SpO2 96%   BMI 27.75 kg/m     Wt Readings from Last 3 Encounters:  07/04/22 177 lb 3.2 oz (80.4 kg)  06/04/22 170 lb (77.1 kg)  05/16/22 172 lb (78 kg)     GEN:  Well nourished, well developed in no acute distress HEENT: Normal  NECK: No JVD; No carotid bruits LYMPHATICS: No lymphadenopathy CARDIAC: RRR, no murmurs, rubs, gallops RESPIRATORY:  Clear to auscultation without rales, wheezing or rhonchi  ABDOMEN: Soft, non-tender, non-distended MUSCULOSKELETAL:  No edema; No deformity  SKIN: Warm and dry NEUROLOGIC:  Alert and oriented x 3 PSYCHIATRIC:  Normal affect      ASSESSMENT:    1. Paroxysmal A-fib (HCC)   2. Cerebrovascular accident (CVA) due to embolism of right middle cerebral artery (HCC)   3. ST elevation myocardial infarction involving right coronary artery (HCC)    PLAN:    In order of problems listed above:   #Paroxysmal atrial fibrillation  I have seen [name] Short in the office today who is being considered for a Watchman left atrial appendage closure device. I believe they will benefit from this procedure given their history of atrial fibrillation, CHA2DS2-VASc score of 6 and unadjusted ischemic stroke rate of 9.7% per year. Unfortunately, the patient is not felt to be a long term anticoagulation candidate secondary to hematuria. The patient's chart has been reviewed and I feel that they would be a candidate for short term oral anticoagulation after Watchman implant.   It is my belief that after undergoing a LAA closure procedure, [name] Short will not need long term anticoagulation which eliminates anticoagulation side effects and major bleeding risk.   Procedural risks for the Watchman implant have been reviewed with the patient including a 0.5% risk of stroke, <1% risk of perforation and <1% risk of device embolization.  Other risks include bleeding, vascular damage, tamponade, worsening renal function, and death. The patient understands these risk and wishes to proceed.     The published clinical data on the safety and effectiveness of WATCHMAN include but are not limited to the following: - Holmes DR, Everlene Farrier, Sick P et al. for the PROTECT AF Investigators. Percutaneous closure of the left atrial appendage versus warfarin therapy for prevention of stroke in patients with atrial fibrillation: a randomised non-inferiority trial. Lancet 2009; 374: 534-42. Everlene Farrier, Doshi SK, Isa Rankin D et al. on behalf of the PROTECT AF Investigators. Percutaneous Left Atrial Appendage Closure for Stroke Prophylaxis in Patients With Atrial Fibrillation 2.3-Year Follow-up of the PROTECT AF (Watchman Left Atrial Appendage System for Embolic Protection in Patients With Atrial Fibrillation) Trial. Circulation 2013; 127:720-729. - Alli O, Doshi S,  Kar S, Reddy VY, Sievert H et al. Quality of Life Assessment in the Randomized PROTECT AF (Percutaneous Closure of the Left Atrial Appendage Versus Warfarin Therapy for Prevention of Stroke in Patients With Atrial Fibrillation) Trial of Patients at Risk for Stroke With Nonvalvular Atrial Fibrillation. J Am Coll Cardiol 2013; 61:1790-8. Aline August DR, [place]ia [place], Price M, Whisenant B, Sievert H, Doshi S, Huber K, Reddy V. Prospective randomized evaluation of the Watchman left atrial appendage Device in patients with atrial fibrillation versus long-term warfarin therapy; the PREVAIL trial. Journal of the [employer] of Cardiology, Vol. 4, No. 1, 2014, 1-11. - Kar S, Doshi SK, Sadhu A, Horton R, Osorio J et al. Primary outcome evaluation of a next-generation left atrial appendage closure device: results from the PINNACLE FLX trial. Circulation 2021;143(18)1754-1762.    After today's visit with the patient which was dedicated solely for shared decision making visit regarding LAA closure device,  the patient decided to proceed with the LAA appendage closure procedure scheduled to be done in the near future at [facility]. Prior to the procedure, I would like to obtain a gated  CT scan of the chest with contrast timed for PV/LA visualization.    HAS-BLED score 5 Hypertension Yes  Abnormal renal and liver function (Dialysis, transplant, Cr >2.26 mg/dL /Cirrhosis or Bilirubin >2x Normal or AST/ALT/AP >3x Normal) No  Stroke Yes  Bleeding Yes  Labile INR (Unstable/high INR) No  Elderly (>65) Yes  Drugs or alcohol (? 8 drinks/week, anti-plt or NSAID) Yes   CHA2DS2-VASc Score = 6  The patient's score is based upon: CHF History: 1 HTN History: 1 Diabetes History: 0 Stroke History: 2 Vascular Disease History: 1 Age Score: 1 Gender Score: 0   Plan for Eliquis/Plavix in the peri-implant period. Plan to stop Eliquis 45 days after Watchman implant and continue Plavix for his CAD indication.    #CAD #STEMI Doing well. No chest pain.     Medication Adjustments/Labs and Tests Ordered: Current medicines are reviewed at length with the patient today.  Concerns regarding medicines are outlined above.  No orders of the defined types were placed in this encounter.  No orders of the defined types were placed in this encounter.  This document serves as a record of services personally performed by Maryalyce Sanjuan, MD. It was created on her behalf by Alexis Herring, a trained medical scribe. The creation of this record is based on the scribe's personal observations and the provider's statements to them. This document has been checked and approved by the attending provider.  I, Ahava Kissoon T Harumi Yamin, MD, have reviewed all documentation for this visit. The documentation on 07/04/22 for the exam, diagnosis, procedures, and orders are all accurate and complete.    Signed, Imraan Wendell, MD, FACC, FHRS 07/04/2022 11:27 AM    Electrophysiology [facility] 

## 2022-07-05 LAB — BASIC METABOLIC PANEL
BUN/Creatinine Ratio: 9 — ABNORMAL LOW (ref 10–24)
BUN: 9 mg/dL (ref 8–27)
CO2: 23 mmol/L (ref 20–29)
Calcium: 9.4 mg/dL (ref 8.6–10.2)
Chloride: 106 mmol/L (ref 96–106)
Creatinine, Ser: 1.04 mg/dL (ref 0.76–1.27)
Glucose: 109 mg/dL — ABNORMAL HIGH (ref 70–99)
Potassium: 4 mmol/L (ref 3.5–5.2)
Sodium: 145 mmol/L — ABNORMAL HIGH (ref 134–144)
eGFR: 75 mL/min/{1.73_m2} (ref >59)

## 2022-07-07 ENCOUNTER — Telehealth (HOSPITAL_COMMUNITY): Payer: Self-pay | Admitting: *Deleted

## 2022-07-07 HISTORY — PX: OTHER SURGICAL HISTORY: SHX169

## 2022-07-07 NOTE — Telephone Encounter (Signed)
Attempted to call patient regarding upcoming cardiac CT appointment. °Left message on voicemail with name and callback number ° °Tykel Badie RN Navigator Cardiac Imaging °[facility] °[phone] Office °[phone] Cell ° °

## 2022-07-08 ENCOUNTER — Ambulatory Visit (HOSPITAL_COMMUNITY)
Admission: RE | Admit: 2022-07-08 | Discharge: 2022-07-08 | Disposition: A | Discharge status: Home / Self Care | Payer: Medicare Other | Source: Ambulatory Visit | Attending: Cardiology | Admitting: Cardiology

## 2022-07-08 ENCOUNTER — Other Ambulatory Visit (HOSPITAL_COMMUNITY): Payer: Medicare Other

## 2022-07-08 DIAGNOSIS — I480 Paroxysmal atrial fibrillation: Secondary | ICD-10-CM | POA: Diagnosis not present

## 2022-07-08 DIAGNOSIS — I63411 Cerebral infarction due to embolism of right middle cerebral artery: Secondary | ICD-10-CM | POA: Diagnosis not present

## 2022-07-08 DIAGNOSIS — R319 Hematuria, unspecified: Secondary | ICD-10-CM | POA: Insufficient documentation

## 2022-07-08 MED ORDER — IOHEXOL 350 MG/ML SOLN
95.0000 mL | Freq: Once | INTRAVENOUS | Status: AC | PRN
Start: 2022-07-08 — End: 2022-07-08
  Administered 2022-07-08: 95 mL via INTRAVENOUS

## 2022-07-09 ENCOUNTER — Other Ambulatory Visit: Payer: Self-pay

## 2022-07-09 ENCOUNTER — Telehealth: Payer: Self-pay

## 2022-07-09 DIAGNOSIS — I480 Paroxysmal atrial fibrillation: Secondary | ICD-10-CM

## 2022-07-09 NOTE — Telephone Encounter (Signed)
Per [employer]:

## 2022-07-09 NOTE — Telephone Encounter (Signed)
Discussed CT results with Dr. Quentin Ore, who personally reviewed imaging and stated OK to proceed with [place].  Spoke with the patient and his wife at length.  Informed them that Mr. Lagoy's anatomy is amenable to closure on CT. They understand that as discussed in clinic, there is still a chance of unsuccessful LAAO on DOS.   They elect to proceed with LAAO on 07/17/2022. Instructions made and sent to patient via MyChart. Will call later today and review together.  They were grateful for call and agree with plan.

## 2022-07-16 ENCOUNTER — Telehealth: Payer: Self-pay

## 2022-07-16 ENCOUNTER — Other Ambulatory Visit (HOSPITAL_COMMUNITY): Payer: Self-pay | Admitting: *Deleted

## 2022-07-16 NOTE — Telephone Encounter (Signed)
Confirmed procedure date of 07/17/2022. Confirmed arrival time of 0700 for procedure time at 0930. Reviewed pre-procedure instructions with patient's wife. The patient understands to call if questions/concerns arise prior to procedure.

## 2022-07-17 ENCOUNTER — Encounter (HOSPITAL_COMMUNITY)
Admission: RE | Disposition: A | Discharge status: Home / Self Care | Payer: Self-pay | Source: Home / Self Care | Attending: Cardiology

## 2022-07-17 ENCOUNTER — Inpatient Hospital Stay (HOSPITAL_COMMUNITY): Payer: Medicare Other

## 2022-07-17 ENCOUNTER — Inpatient Hospital Stay (HOSPITAL_COMMUNITY): Payer: Medicare Other | Admitting: Certified Registered Nurse Anesthetist

## 2022-07-17 ENCOUNTER — Other Ambulatory Visit: Payer: Self-pay

## 2022-07-17 ENCOUNTER — Encounter (HOSPITAL_COMMUNITY): Payer: Self-pay | Admitting: Cardiology

## 2022-07-17 ENCOUNTER — Inpatient Hospital Stay (HOSPITAL_COMMUNITY)
Admission: RE | Admit: 2022-07-17 | Discharge: 2022-07-17 | DRG: 274 | Disposition: A | Discharge status: Home / Self Care | Payer: Medicare Other | Attending: Cardiology | Admitting: Cardiology

## 2022-07-17 DIAGNOSIS — I480 Paroxysmal atrial fibrillation: Secondary | ICD-10-CM

## 2022-07-17 DIAGNOSIS — Z955 Presence of coronary angioplasty implant and graft: Secondary | ICD-10-CM | POA: Diagnosis not present

## 2022-07-17 DIAGNOSIS — Z79899 Other long term (current) drug therapy: Secondary | ICD-10-CM | POA: Diagnosis not present

## 2022-07-17 DIAGNOSIS — F17200 Nicotine dependence, unspecified, uncomplicated: Secondary | ICD-10-CM | POA: Diagnosis not present

## 2022-07-17 DIAGNOSIS — K219 Gastro-esophageal reflux disease without esophagitis: Secondary | ICD-10-CM | POA: Diagnosis present

## 2022-07-17 DIAGNOSIS — F17290 Nicotine dependence, other tobacco product, uncomplicated: Secondary | ICD-10-CM | POA: Diagnosis present

## 2022-07-17 DIAGNOSIS — I252 Old myocardial infarction: Secondary | ICD-10-CM

## 2022-07-17 DIAGNOSIS — I4891 Unspecified atrial fibrillation: Secondary | ICD-10-CM | POA: Diagnosis not present

## 2022-07-17 DIAGNOSIS — I2510 Atherosclerotic heart disease of native coronary artery without angina pectoris: Secondary | ICD-10-CM

## 2022-07-17 DIAGNOSIS — Z006 Encounter for examination for normal comparison and control in clinical research program: Secondary | ICD-10-CM

## 2022-07-17 DIAGNOSIS — M1990 Unspecified osteoarthritis, unspecified site: Secondary | ICD-10-CM | POA: Diagnosis present

## 2022-07-17 DIAGNOSIS — Z8249 Family history of ischemic heart disease and other diseases of the circulatory system: Secondary | ICD-10-CM

## 2022-07-17 DIAGNOSIS — Z91013 Allergy to seafood: Secondary | ICD-10-CM

## 2022-07-17 DIAGNOSIS — Z96643 Presence of artificial hip joint, bilateral: Secondary | ICD-10-CM | POA: Diagnosis present

## 2022-07-17 DIAGNOSIS — E785 Hyperlipidemia, unspecified: Secondary | ICD-10-CM | POA: Diagnosis not present

## 2022-07-17 DIAGNOSIS — Z818 Family history of other mental and behavioral disorders: Secondary | ICD-10-CM | POA: Diagnosis not present

## 2022-07-17 DIAGNOSIS — Z8042 Family history of malignant neoplasm of prostate: Secondary | ICD-10-CM

## 2022-07-17 DIAGNOSIS — Z7901 Long term (current) use of anticoagulants: Secondary | ICD-10-CM | POA: Diagnosis not present

## 2022-07-17 DIAGNOSIS — I10 Essential (primary) hypertension: Secondary | ICD-10-CM

## 2022-07-17 DIAGNOSIS — I2119 ST elevation (STEMI) myocardial infarction involving other coronary artery of inferior wall: Secondary | ICD-10-CM | POA: Diagnosis present

## 2022-07-17 DIAGNOSIS — Z833 Family history of diabetes mellitus: Secondary | ICD-10-CM | POA: Diagnosis not present

## 2022-07-17 DIAGNOSIS — Z8261 Family history of arthritis: Secondary | ICD-10-CM

## 2022-07-17 DIAGNOSIS — Z01818 Encounter for other preprocedural examination: Secondary | ICD-10-CM | POA: Diagnosis not present

## 2022-07-17 DIAGNOSIS — Z888 Allergy status to other drugs, medicaments and biological substances status: Secondary | ICD-10-CM

## 2022-07-17 DIAGNOSIS — I639 Cerebral infarction, unspecified: Secondary | ICD-10-CM | POA: Diagnosis present

## 2022-07-17 DIAGNOSIS — R319 Hematuria, unspecified: Secondary | ICD-10-CM | POA: Diagnosis present

## 2022-07-17 DIAGNOSIS — Z95818 Presence of other cardiac implants and grafts: Secondary | ICD-10-CM

## 2022-07-17 DIAGNOSIS — I6930 Unspecified sequelae of cerebral infarction: Secondary | ICD-10-CM

## 2022-07-17 DIAGNOSIS — Z7902 Long term (current) use of antithrombotics/antiplatelets: Secondary | ICD-10-CM

## 2022-07-17 DIAGNOSIS — I340 Nonrheumatic mitral (valve) insufficiency: Secondary | ICD-10-CM | POA: Diagnosis not present

## 2022-07-17 HISTORY — PX: TEE WITHOUT CARDIOVERSION: SHX5443

## 2022-07-17 HISTORY — PX: LEFT ATRIAL APPENDAGE OCCLUSION: EP1229

## 2022-07-17 HISTORY — DX: Presence of other cardiac implants and grafts: Z95.818

## 2022-07-17 LAB — CBC
HCT: 40.7 % (ref 39.0–52.0)
Hemoglobin: 13 g/dL (ref 13.0–17.0)
MCH: 29.7 pg (ref 26.0–34.0)
MCHC: 31.9 g/dL (ref 30.0–36.0)
MCV: 93.1 fL (ref 80.0–100.0)
Platelets: 121 10*3/uL — ABNORMAL LOW (ref 150–400)
RBC: 4.37 MIL/uL (ref 4.22–5.81)
RDW: 14.5 % (ref 11.5–15.5)
WBC: 8.1 10*3/uL (ref 4.0–10.5)
nRBC: 0 % (ref 0.0–0.2)

## 2022-07-17 LAB — BASIC METABOLIC PANEL
Anion gap: 6 (ref 5–15)
BUN: 9 mg/dL (ref 8–23)
CO2: 26 mmol/L (ref 22–32)
Calcium: 9.1 mg/dL (ref 8.9–10.3)
Chloride: 111 mmol/L (ref 98–111)
Creatinine, Ser: 1.1 mg/dL (ref 0.61–1.24)
GFR, Estimated: >60 mL/min (ref >60)
Glucose, Bld: 125 mg/dL — ABNORMAL HIGH (ref 70–99)
Potassium: 3.8 mmol/L (ref 3.5–5.1)
Sodium: 143 mmol/L (ref 135–145)

## 2022-07-17 LAB — TYPE AND SCREEN
ABO/RH(D): O POS
Antibody Screen: NEGATIVE

## 2022-07-17 LAB — POCT ACTIVATED CLOTTING TIME: Activated Clotting Time: 347 seconds

## 2022-07-17 SURGERY — LEFT ATRIAL APPENDAGE OCCLUSION
Anesthesia: General

## 2022-07-17 MED ORDER — PHENYLEPHRINE HCL-NACL 20-0.9 MG/250ML-% IV SOLN
INTRAVENOUS | Status: DC | PRN
  Administered 2022-07-17: 20 ug/min via INTRAVENOUS

## 2022-07-17 MED ORDER — LACTATED RINGERS IV SOLN: INTRAVENOUS | Status: DC

## 2022-07-17 MED ORDER — ROCURONIUM BROMIDE 10 MG/ML (PF) SYRINGE
PREFILLED_SYRINGE | INTRAVENOUS | Status: DC | PRN
  Administered 2022-07-17: 70 mg via INTRAVENOUS

## 2022-07-17 MED ORDER — SODIUM CHLORIDE 0.9 % IV SOLN: INTRAVENOUS | Status: DC | PRN

## 2022-07-17 MED ORDER — LIDOCAINE 2% (20 MG/ML) 5 ML SYRINGE
INTRAMUSCULAR | Status: DC | PRN
  Administered 2022-07-17: 40 mg via INTRAVENOUS

## 2022-07-17 MED ORDER — FENTANYL CITRATE (PF) 100 MCG/2ML IJ SOLN
INTRAMUSCULAR | Status: DC | PRN
  Administered 2022-07-17: 100 ug via INTRAVENOUS

## 2022-07-17 MED ORDER — HEPARIN (PORCINE) IN NACL 2000-0.9 UNIT/L-% IV SOLN
INTRAVENOUS | Status: DC | PRN
  Administered 2022-07-17: 1000 mL

## 2022-07-17 MED ORDER — ACETAMINOPHEN 325 MG PO TABS: 650.0000 mg | ORAL_TABLET | ORAL | Status: DC | PRN

## 2022-07-17 MED ORDER — SODIUM CHLORIDE 0.9 % IV SOLN: 250.0000 mL | INTRAVENOUS | Status: DC | PRN

## 2022-07-17 MED ORDER — ACETAMINOPHEN 500 MG PO TABS
1000.0000 mg | ORAL_TABLET | Freq: Once | ORAL | Status: AC
  Administered 2022-07-17: 1000 mg via ORAL
  Filled 2022-07-17: qty 2

## 2022-07-17 MED ORDER — PROTAMINE SULFATE 10 MG/ML IV SOLN
INTRAVENOUS | Status: DC | PRN
  Administered 2022-07-17: 30 mg via INTRAVENOUS

## 2022-07-17 MED ORDER — SODIUM CHLORIDE 0.9% FLUSH: 3.0000 mL | Freq: Two times a day (BID) | INTRAVENOUS | Status: DC

## 2022-07-17 MED ORDER — CHLORHEXIDINE GLUCONATE 0.12 % MT SOLN
OROMUCOSAL | Status: AC
  Administered 2022-07-17: 15 mL
  Filled 2022-07-17: qty 15

## 2022-07-17 MED ORDER — HEPARIN (PORCINE) IN NACL 2000-0.9 UNIT/L-% IV SOLN
INTRAVENOUS | Status: AC
  Filled 2022-07-17: qty 1000

## 2022-07-17 MED ORDER — APIXABAN 5 MG PO TABS
5.0000 mg | ORAL_TABLET | Freq: Two times a day (BID) | ORAL | Status: DC
  Administered 2022-07-17: 5 mg via ORAL
  Filled 2022-07-17: qty 1

## 2022-07-17 MED ORDER — SODIUM CHLORIDE 0.9 % IV SOLN: INTRAVENOUS | Status: DC

## 2022-07-17 MED ORDER — SODIUM CHLORIDE 0.9% FLUSH: 3.0000 mL | INTRAVENOUS | Status: DC | PRN

## 2022-07-17 MED ORDER — CLOPIDOGREL BISULFATE 75 MG PO TABS: 75.0000 mg | ORAL_TABLET | Freq: Every day | ORAL | Status: DC

## 2022-07-17 MED ORDER — CEFAZOLIN SODIUM-DEXTROSE 2-4 GM/100ML-% IV SOLN
2.0000 g | INTRAVENOUS | Status: AC
  Administered 2022-07-17: 2 g via INTRAVENOUS
  Filled 2022-07-17: qty 100

## 2022-07-17 MED ORDER — ONDANSETRON HCL 4 MG/2ML IJ SOLN
INTRAMUSCULAR | Status: DC | PRN
  Administered 2022-07-17: 4 mg via INTRAVENOUS

## 2022-07-17 MED ORDER — HEPARIN (PORCINE) IN NACL 1000-0.9 UT/500ML-% IV SOLN
INTRAVENOUS | Status: DC | PRN
  Administered 2022-07-17: 500 mL

## 2022-07-17 MED ORDER — EPHEDRINE SULFATE-NACL 50-0.9 MG/10ML-% IV SOSY
PREFILLED_SYRINGE | INTRAVENOUS | Status: DC | PRN
  Administered 2022-07-17 (×3): 5 mg via INTRAVENOUS

## 2022-07-17 MED ORDER — SUGAMMADEX SODIUM 200 MG/2ML IV SOLN
INTRAVENOUS | Status: DC | PRN
  Administered 2022-07-17: 200 mg via INTRAVENOUS

## 2022-07-17 MED ORDER — DEXAMETHASONE SODIUM PHOSPHATE 10 MG/ML IJ SOLN
INTRAMUSCULAR | Status: DC | PRN
  Administered 2022-07-17: 10 mg via INTRAVENOUS

## 2022-07-17 MED ORDER — IOHEXOL 350 MG/ML SOLN
INTRAVENOUS | Status: DC | PRN
  Administered 2022-07-17 (×2): 10 mL

## 2022-07-17 MED ORDER — PROPOFOL 10 MG/ML IV BOLUS
INTRAVENOUS | Status: DC | PRN
  Administered 2022-07-17: 100 mg via INTRAVENOUS

## 2022-07-17 MED ORDER — HEPARIN SODIUM (PORCINE) 1000 UNIT/ML IJ SOLN
INTRAMUSCULAR | Status: DC | PRN
  Administered 2022-07-17: 12000 [IU] via INTRAVENOUS

## 2022-07-17 MED ORDER — ONDANSETRON HCL 4 MG/2ML IJ SOLN: 4.0000 mg | Freq: Four times a day (QID) | INTRAMUSCULAR | Status: DC | PRN

## 2022-07-17 SURGICAL SUPPLY — 21 items
BLANKET WARM UNDERBOD FULL ACC (MISCELLANEOUS) ×1 IMPLANT
CATH DIAG 6FR PIGTAIL ANGLED (CATHETERS) IMPLANT
CLOSURE PERCLOSE PROSTYLE (VASCULAR PRODUCTS) IMPLANT
DEVICE WATCHMAN FLX PROC (KITS) IMPLANT
KIT HEART LEFT (KITS) ×1 IMPLANT
KIT SHEA VERSACROSS LAAC CONNE (KITS) IMPLANT
PACK CARDIAC CATHETERIZATION (CUSTOM PROCEDURE TRAY) ×1 IMPLANT
PAD DEFIB RADIO PHYSIO CONN (PAD) ×1 IMPLANT
SHEATH PERFORMER 16FR 30 (SHEATH) IMPLANT
SHEATH PINNACLE 11FRX10 (SHEATH) IMPLANT
SHEATH PINNACLE 8F 10CM (SHEATH) IMPLANT
SHEATH PROBE COVER 6X72 (BAG) ×1 IMPLANT
SYS WATCHMAN FXD DBL (SHEATH) ×1
SYS WATCHMAN FXD SGL (SHEATH) ×1
SYSTEM WATCHMAN FXD DBL (SHEATH) IMPLANT
SYSTEM WATCHMAN FXD SGL (SHEATH) IMPLANT
TRANSDUCER W/STOPCOCK (MISCELLANEOUS) ×1 IMPLANT
TUBING CIL FLEX 10 FLL-RA (TUBING) ×1 IMPLANT
WATCHMAN FLX 24 (Prosthesis & Implant Heart) IMPLANT
WATCHMAN FLX PROCEDURE DEVICE (KITS) ×1 IMPLANT
WATCHMAN PROCED TRUSEAL ACCESS (SHEATH) IMPLANT

## 2022-07-17 NOTE — Anesthesia Preprocedure Evaluation (Addendum)
Anesthesia Evaluation  Patient identified by MRN, date of birth, ID band Patient awake    Reviewed: Allergy & Precautions, NPO status , Patient's Chart, lab work & pertinent test results, reviewed documented beta blocker date and time   History of Anesthesia Complications Negative for: history of anesthetic complications  Airway Mallampati: II  TM Distance: >3 FB Neck ROM: Full    Dental  (+) Dental Advisory Given, Chipped   Pulmonary Current Smoker and Patient abstained from smoking.   breath sounds clear to auscultation       Cardiovascular hypertension, Pt. on medications and Pt. on home beta blockers (-) angina + CAD, + Past MI and + Cardiac Stents   Rhythm:Irregular Rate:Normal  04/2022 Cath:   Mid LAD-1 lesion is 95% stenosed.   Mid LAD-2 lesion is 80% stenosed.   Ost LM lesion is 30% stenosed.   A drug-eluting stent was successfully placed using a SYNERGY XD [id number].   Post intervention, there is a 0% residual stenosis.   Post intervention, there is a 0% residual stenosis.   1. Severe serial mid LAD stenoses 2. Successful PTCA/DES x 1 mid LA  04/2022 ECHO: EF 50-55%. The LV has low normal function, no regional wall motion abnormalities. RVF is normal. There is mildly elevated pulmonary artery systolic  pressure.  Mild MR      Neuro/Psych CVA, Residual Symptoms    GI/Hepatic Neg liver ROS,GERD  Medicated and Controlled,,  Endo/Other  negative endocrine ROS    Renal/GU H/o stones     Musculoskeletal  (+) Arthritis ,    Abdominal   Peds  Hematology Plavix, eliquis   Anesthesia Other Findings   Reproductive/Obstetrics                             Anesthesia Physical Anesthesia Plan  ASA: 3  Anesthesia Plan: General   Post-op Pain Management: Tylenol PO (pre-op)*   Induction: Intravenous  PONV Risk Score and Plan: 1 and Ondansetron and Dexamethasone  Airway Management  Planned: Oral ETT  Additional Equipment: ClearSight  Intra-op Plan:   Post-operative Plan: Extubation in OR  Informed Consent: I have reviewed the patients History and Physical, chart, labs and discussed the procedure including the risks, benefits and alternatives for the proposed anesthesia with the patient or authorized representative who has indicated his/her understanding and acceptance.     Dental advisory given  Plan Discussed with: CRNA and Surgeon  Anesthesia Plan Comments:        Anesthesia Quick Evaluation

## 2022-07-17 NOTE — Anesthesia Procedure Notes (Signed)
Procedure Name: Intubation Date/Time: 07/17/2022 9:59 AM  Performed by: Dorthea Cove, CRNAPre-anesthesia Checklist: Patient identified, Emergency Drugs available, Suction available and Patient being monitored Patient Re-evaluated:Patient Re-evaluated prior to induction Oxygen Delivery Method: Circle system utilized Preoxygenation: Pre-oxygenation with 100% oxygen Induction Type: IV induction Ventilation: Mask ventilation without difficulty and Two handed mask ventilation required Laryngoscope Size: Glidescope and 4 Grade View: Grade II Tube type: Oral Tube size: 7.5 mm Number of attempts: 1 Airway Equipment and Method: Stylet and Oral airway Placement Confirmation: ETT inserted through vocal cords under direct vision, positive ETCO2 and breath sounds checked- equal and bilateral Secured at: 23 cm Tube secured with: Tape Dental Injury: Teeth and Oropharynx as per pre-operative assessment  Difficulty Due To: Difficult Airway- due to anterior larynx and Difficulty was anticipated Comments: Recommend glidescope intubation. Large teeth, narrow arched palate, and anterior larynx.

## 2022-07-17 NOTE — Interval H&P Note (Signed)
History and Physical Interval Note:  07/17/2022 9:42 AM  [name] Short  has presented today for surgery, with the diagnosis of afib.  The various methods of treatment have been discussed with the patient and family. After consideration of risks, benefits and other options for treatment, the patient has consented to  Procedure(s): LEFT ATRIAL APPENDAGE OCCLUSION (N/A) TRANSESOPHAGEAL ECHOCARDIOGRAM (TEE) (N/A) as a surgical intervention.  The patient's history has been reviewed, patient examined, no change in status, stable for surgery.  I have reviewed the patient's chart and labs.  Questions were answered to the patient's satisfaction.     Tycen Dockter T Niall Illes

## 2022-07-17 NOTE — Progress Notes (Signed)
Admission from the cath lab by stretcher awake and alert. Right groin dressing intact. Instructed to avoid bending of the affected leg. Continue to monitor.

## 2022-07-17 NOTE — Transfer of Care (Signed)
Immediate Anesthesia Transfer of Care Note  Patient: [name] Short  Procedure(s) Performed: LEFT ATRIAL APPENDAGE OCCLUSION TRANSESOPHAGEAL ECHOCARDIOGRAM (TEE)  Patient Location: Cath Lab  Anesthesia Type:General  Level of Consciousness: awake, alert  and oriented  Airway & Oxygen Therapy: Patient Spontanous Breathing and Patient connected to nasal cannula oxygen  Post-op Assessment: Report given to RN and Post -op Vital signs reviewed and stable  Post vital signs: Reviewed and stable  Last Vitals:  Vitals Value Taken Time  BP 107/54 07/17/22 1102  Temp 36.6 C 07/17/22 1105  Pulse 61 07/17/22 1105  Resp 13 07/17/22 1105  SpO2 99 % 07/17/22 1105  Vitals shown include unvalidated device data.  Last Pain:  Vitals:   07/17/22 1105  TempSrc: Temporal  PainSc: Asleep         Complications:  Encounter Notable Events  Notable Event Outcome Phase Comment  Difficult to intubate - expected  Intraprocedure Filed from anesthesia note documentation.

## 2022-07-17 NOTE — Progress Notes (Signed)
  Echocardiogram Echocardiogram Transesophageal has been performed.  [name] Short M 07/17/2022, 11:08 AM

## 2022-07-17 NOTE — Discharge Summary (Signed)
[facility]    Patient ID: [name] Short,  MRN: [id number], DOB/AGE: 1946-06-25 76 y.o.  Admit date: 07/17/2022 Discharge date: 07/17/2022  Primary Care Physician: Sheliah Hatch, MD  Primary Cardiologist: Little Ishikawa, MD  Electrophysiologist: Lanier Prude, MD  Primary Discharge Diagnosis:  Paroxysmal Atrial Fibrillation Poor candidacy for long term anticoagulation due to  recurrent hematuria  Secondary Discharge Diagnosis:  -HTN -HLD -CVA -CAD s/p STEMI with LAD stent placement 05/02/22 (currently on Plavix and Eliquis)  Procedures This Admission:  Transeptal Puncture Intra-procedural TEE which showed no LAA thrombus Left atrial appendage occlusive device placement on 07/17/22 by Dr. Lalla Brothers.   Post Implant Anticoagulation Strategy: Continue Plavix 75mg  PO daily indefinitely. Continue Eliquis 5mg  PO BID x 45 days then stop. Plan for CT scan after 60 days to reassess Watchman positioning.   Brief HPI: [name] Short is a 76 y.o. male with a history of HTN, HLD, CVA, CAD - STEMI with LAD stent placement 05/02/22 (currently on Plavix and Eliquis), PAF with intermittent hematuria on anticoagulation.   [name] Short presented to the ED with slurred speech and left hand weakness in 09/2020, found to have acute CVA.  MRI showed acute infarct of the posterior right frontal lobe. Echocardiogram at that time showed normal EF. He was started on Eliquis 5 mg twice a day as the stroke was felt to be related to atrial fibrillation. Heart monitor showed 18% A-fib burden with longest episode lasting 1 day and 7 hours, average heart rate 80 bpm.  He was then seen by Dr. Andee Poles and was referred to Dr. 10/2020 for evaluation of Watchman procedure.     More recently, patient presented to the hospital on 05/01/2022 with chest pain, shortness of breath and transient diaphoresis. EKG showed ST elevations in inferior leads with reciprocal changes in V1 and V2.   Initial cardiac catheterization revealed 100% distal RCA occlusion treated with PTCA and DES x1, 95% mid LAD lesion, 80% mid LAD lesion. Patient was brought back to the Cath Lab on the following day on 05/02/2022 and underwent DES to mid LAD.  Postprocedure, he was started on aspirin, Plavix and Eliquis with plan to discontinue aspirin after 1 month.  He was seen in cardiology follow up and was noted to be doing well from a CV standpoint. Dr. 07/01/2022 saw him on 07/04/22 with plans to proceed with LAAO closure. He was continued on Eliquis and Plavix. Post Watchman plan will be to stop Eliquis at 45 days after implant and continue Plavix for his CAD indication.   Hospital Course:  The patient was admitted and underwent left atrial appendage occlusive device placement with Watchman FLX 34mm device.  He was monitored on telemetry in the post operative setting which showed NSR. Groin site has been without complication. Given his stability, he is being considered for same day discharge at 5pm today. Wound care and restrictions were reviewed with the patient. The patient has been scheduled for post procedure follow up with 09/03/22, NP in approximately 1 month. Medication plan will be to continue Eliquis 5mg  and Plavix 75mg . A repeat CT at approximately 60 days will be performed to ensure proper seal of the device.   Physical Exam: Vitals:   07/17/22 1217 07/17/22 1230 07/17/22 1300 07/17/22 1335  BP: (!) 108/52 (!) 112/55 117/61 (!) 106/52  Pulse: (!) 49 (!) 53 (!) 51 63  Resp: 12 (!) 8 12 17   Temp:  TempSrc:      SpO2: 100% 100% 98% 94%  Weight:      Height:       General: Well developed, well nourished, NAD Lungs:Clear to ausculation bilaterally. No wheezes, rales, or rhonchi. Breathing is unlabored. Cardiovascular: RRR with S1 S2. No murmurs Extremities: No edema.Groin site stable.  Neuro: Alert and oriented. No focal deficits. No facial asymmetry. MAE spontaneously. Psych: Responds to  questions appropriately with normal affect.    Labs:   Lab Results  Component Value Date   WBC 8.1 07/17/2022   HGB 13.0 07/17/2022   HCT 40.7 07/17/2022   MCV 93.1 07/17/2022   PLT 121 (L) 07/17/2022   No results for input(s): "NA", "K", "CL", "CO2", "BUN", "CREATININE", "CALCIUM", "PROT", "BILITOT", "ALKPHOS", "ALT", "AST", "GLUCOSE" in the last 168 hours.  Invalid input(s): "LABALBU"   Discharge Medications:  Allergies as of 07/17/2022       Reactions   Shellfish-derived Products Other (See Comments)   Gout   Warfarin Sodium Rash        Medication List     TAKE these medications    acetaminophen 500 MG tablet Commonly known as: TYLENOL Take 500-1,000 mg by mouth every 6 (six) hours as needed for moderate pain.   allopurinol 100 MG tablet Commonly known as: ZYLOPRIM Take 2 tablets (200 mg total) by mouth daily.   apixaban 5 MG Tabs tablet Commonly known as: ELIQUIS Take 1 tablet (5 mg total) by mouth 2 (two) times daily.   Blink Tears 0.25 % Soln Generic drug: Polyethylene Glycol 400 Place 1 drop into both eyes 2 (two) times daily as needed (for dryness or irritation).   clopidogrel 75 MG tablet Commonly known as: PLAVIX Take 1 tablet (75 mg total) by mouth daily.   colchicine 0.6 MG tablet Take 0.6 mg by mouth daily as needed (gout).   melatonin 5 MG Tabs Take 5 mg by mouth at bedtime as needed (sleep).   metoprolol tartrate 25 MG tablet Commonly known as: LOPRESSOR Take 1 tablet (25 mg total) by mouth 2 (two) times daily.   multivitamin with minerals Tabs tablet Take 1 tablet by mouth daily.   nitroGLYCERIN 0.4 MG SL tablet Commonly known as: Nitrostat Place 1 tablet (0.4 mg total) under the tongue every 5 (five) minutes as needed.   pantoprazole 40 MG tablet Commonly known as: PROTONIX Take 1 tablet (40 mg total) by mouth daily.   rosuvastatin 40 MG tablet Commonly known as: CRESTOR Take 1 tablet (40 mg total) by mouth daily.   sodium  chloride 0.65 % Soln nasal spray Commonly known as: OCEAN Place 1 spray into both nostrils as needed for congestion.   tadalafil 5 MG tablet Commonly known as: CIALIS Take 5 mg by mouth daily as needed for erectile dysfunction.   trolamine salicylate 10 % cream Commonly known as: ASPERCREME Apply 1 Application topically as needed for muscle pain (Heel).        Disposition:  Home Discharge Instructions     Call MD for:  difficulty breathing, headache or visual disturbances   Complete by: As directed    Call MD for:  extreme fatigue   Complete by: As directed    Call MD for:  hives   Complete by: As directed    Call MD for:  persistant dizziness or light-headedness   Complete by: As directed    Call MD for:  persistant nausea and vomiting   Complete by: As directed    Call  MD for:  redness, tenderness, or signs of infection (pain, swelling, redness, odor or green/yellow discharge around incision site)   Complete by: As directed    Call MD for:  severe uncontrolled pain   Complete by: As directed    Call MD for:  temperature >100.4   Complete by: As directed    Diet - low sodium heart healthy   Complete by: As directed    Discharge instructions   Complete by: As directed    [facility] Procedure, Care After  Procedure MD: Dr. Isidoro Donning Clinical Coordinator: Karsten Fells, RN  This sheet gives you information about how to care for yourself after your procedure. Your health care provider may also give you more specific instructions. If you have problems or questions, contact your health care provider.  What can I expect after the procedure? After the procedure, it is common to have: Bruising around your puncture site. Tenderness around your puncture site. Tiredness (fatigue).  Medication instructions It is very important to continue to take your blood thinner as directed by your doctor after the Watchman procedure. Call your procedure doctor's office with question or  concerns. If you are on Coumadin (warfarin), you will have your INR checked the week after your procedure, with a goal INR of 2.0 - 3.0. Please follow your medication instructions on your discharge summary. Only take the medications listed on your discharge paperwork.  Follow up You will be seen in 1 month after your procedure  You will have another CT approximately 8 weeks after your procedure mark to check your device You will follow up the MD/APP who performed your procedure 6 months after your procedure The Watchman Clinical Coordinator will check in with you from time to time, including 1 and 2 years after your procedure.    Follow these instructions at home: Puncture site care  Follow instructions from your health care provider about how to take care of your puncture site. Make sure you: If present, leave stitches (sutures), skin glue, or adhesive strips in place.  If a large square bandage is present, this may be removed 24 hours after surgery.  Check your puncture site every day for signs of infection. Check for: Redness, swelling, or pain. Fluid or blood. If your puncture site starts to bleed, lie down on your back, apply firm pressure to the area, and contact your health care provider. Warmth. Pus or a bad smell. Driving Do not drive yourself home if you received sedation Do not drive for at least 4 days after your procedure or however long your health care provider recommends. (Do not resume driving if you have previously been instructed not to drive for other health reasons.) Do not spend greater than 1 hour at a time in a car for the first 3 days. Stop and take a break with a 5 minute walk at least every hour.  Do not drive or use heavy machinery while taking prescription pain medicine.  Activity Avoid activities that take a lot of effort, including exercise, for at least 7 days after your procedure. For the first 3 days, avoid sitting for longer than one hour at a time.   Avoid alcoholic beverages, signing paperwork, or participating in legal proceedings for 24 hours after receiving sedation Do not lift anything that is heavier than 10 lb (4.5 kg) for one week.  No sexual activity for 1 week.  Return to your normal activities as told by your health care provider. Ask your health care provider  what activities are safe for you. General instructions Take over-the-counter and prescription medicines only as told by your health care provider. Do not use any products that contain nicotine or tobacco, such as cigarettes and e-cigarettes. If you need help quitting, ask your health care provider. You may shower after 24 hours, but Do not take baths, swim, or use a hot tub for 1 week.  Do not drink alcohol for 24 hours after your procedure. Keep all follow-up visits as told by your health care provider. This is important. Dental Work: You will require antibiotics prior to any dental work, including cleanings, for 6 months after your Watchman implantation to help protect you from infection. After 6 months, antibiotics are no longer required. Contact a health care provider if: You have redness, mild swelling, or pain around your puncture site. You have soreness in your throat or at your puncture site that does not improve after several days You have fluid or blood coming from your puncture site that stops after applying firm pressure to the area. Your puncture site feels warm to the touch. You have pus or a bad smell coming from your puncture site. You have a fever. You have chest pain or discomfort that spreads to your neck, jaw, or arm. You are sweating a lot. You feel nauseous. You have a fast or irregular heartbeat. You have shortness of breath. You are dizzy or light-headed and feel the need to lie down. You have pain or numbness in the arm or leg closest to your puncture site. Get help right away if: Your puncture site suddenly swells. Your puncture site is  bleeding and the bleeding does not stop after applying firm pressure to the area. These symptoms may represent a serious problem that is an emergency. Do not wait to see if the symptoms will go away. Get medical help right away. Call your local emergency services (911 in the [place] Do not drive yourself to the hospital. Summary After the procedure, it is normal to have bruising and tenderness at the puncture site in your groin, neck, or forearm. Check your puncture site every day for signs of infection. Get help right away if your puncture site is bleeding and the bleeding does not stop after applying firm pressure to the area. This is a medical emergency.  This information is not intended to replace advice given to you by your health care provider. Make sure you discuss any questions you have with your health care provider.   Increase activity slowly   Complete by: As directed        Follow-up Information     Filbert Schilder, NP Follow up on 08/18/2022.   Specialty: Cardiology Why: @ 1pm. Please arrive at 12:45pm. Contact information: [address] [place] [address] [phone]                 Duration of Discharge Encounter: Greater than 30 minutes including physician time.  Signed, Georgie Chard, NP  07/17/2022 2:18 PM

## 2022-07-17 NOTE — Anesthesia Postprocedure Evaluation (Signed)
Anesthesia Post Note  Patient: [name] Short  Procedure(s) Performed: LEFT ATRIAL APPENDAGE OCCLUSION TRANSESOPHAGEAL ECHOCARDIOGRAM (TEE)     Patient location during evaluation: Cath Lab Anesthesia Type: General Level of consciousness: awake and alert, patient cooperative and oriented Pain management: pain level controlled (mild sore throat) Vital Signs Assessment: post-procedure vital signs reviewed and stable Respiratory status: spontaneous breathing, nonlabored ventilation, respiratory function stable and patient connected to nasal cannula oxygen Cardiovascular status: blood pressure returned to baseline and stable Postop Assessment: no apparent nausea or vomiting Anesthetic complications: yes   Encounter Notable Events  Notable Event Outcome Phase Comment  Difficult to intubate - expected  Intraprocedure Filed from anesthesia note documentation.    Last Vitals:  Vitals:   07/17/22 1230 07/17/22 1300  BP: (!) 112/55 117/61  Pulse: (!) 53 (!) 51  Resp: (!) 8 12  Temp:    SpO2: 100% 98%    Last Pain:  Vitals:   07/17/22 1216  TempSrc:   PainSc: 0-No pain                 [name],E. Emannuel Vise

## 2022-07-18 ENCOUNTER — Telehealth: Payer: Self-pay | Admitting: Cardiology

## 2022-07-18 ENCOUNTER — Encounter (HOSPITAL_COMMUNITY): Payer: Self-pay | Admitting: Cardiology

## 2022-07-18 NOTE — Telephone Encounter (Signed)
  [place] Team  Contacted the patient regarding discharge from [facility] on 07/17/22 however there was no answer on both cell and home phone. Left message to call back.   Kathyrn Drown NP-C Structural Heart Team  Pager: [phone] Phone: [phone]

## 2022-07-21 NOTE — Telephone Encounter (Signed)
  [place] Team  Contacted the patient regarding discharge from [facility] on 07/18/2022  The patient understands to follow up with Kathyrn Drown, NP on 08/18/2022 in preparation for imaging on 09/16/2022.  The patient understands discharge instructions? Yes  The patient understands medications and regimen? Yes   The patient reports groin site looks healthy with no S/S of bleeding or infection.  The patient understands to call with any questions or concerns prior to scheduled visit.

## 2022-07-22 ENCOUNTER — Telehealth: Payer: Self-pay

## 2022-07-22 NOTE — Patient Outreach (Signed)
  Care Coordination [facility] Note Transition Care Management Follow-up Telephone Call Date of discharge and from where: 10/19/23[facility] Dx; A-fib How have you been since you were released from the hospital? Patient reports that he had a gout flare up to start on Saturday after returning home. He is taking his gout med and voices sxs improving. Patient aware of foods to avoid to prevent flare ups. Any questions or concerns? No  Items Reviewed: Did the pt receive and understand the discharge instructions provided? Yes  Medications obtained and verified? Yes  Other? Yes  Any new allergies since your discharge? No  Dietary orders reviewed? Yes Do you have support at home? Yes   Home Care and Equipment/Supplies: Were home health services ordered? not applicable If so, what is the name of the agency? N/A  Has the agency set up a time to come to the patient's home? not applicable Were any new equipment or medical supplies ordered?  No What is the name of the medical supply agency? N/A Were you able to get the supplies/equipment? not applicable Do you have any questions related to the use of the equipment or supplies? No  Functional Questionnaire: (I = Independent and D = Dependent) ADLs:   Bathing/Dressing- I  Meal Prep- Assist  Eating- I  Maintaining continence- I  Transferring/Ambulation- I  Managing Meds- Assist  Follow up appointments reviewed:  PCP Hospital f/u appt confirmed? Patient does not have post-discharge 2wks-follow up appt with PCP but already had on schedule 6 month PCP follow up appt. [facility] f/u appt confirmed? Yes  Scheduled to see Dr . Lorin Mercy on 11//07/23 @ 10:45 am and Karolee Stamps on 08/18/22 @ 1pm. Are transportation arrangements needed? No  If their condition worsens, is the pt aware to call PCP or go to the Emergency Dept.? Yes Was the patient provided with contact information for the PCP's office or ED? Yes Was to pt encouraged to call  back with questions or concerns? Yes  SDOH assessments and interventions completed:   Yes  Care Coordination Interventions Activated:  Yes   Care Coordination Interventions:  PCP follow up appointment requested Education provided    Encounter Outcome:  Pt. Visit Completed    Enzo Montgomery, RN,BSN,CCM [facility] Telephonic Care Management Coordinator Direct Phone: [phone] Toll Free: [phone] Fax: [phone]

## 2022-07-22 NOTE — Patient Outreach (Signed)
  Care Coordination [facility] Note Transition Care Management Unsuccessful Follow-up Telephone Call  Date of discharge and from where:  10/19/23[facility]   Attempts:  1st Attempt  Reason for unsuccessful TCM follow-up call:  No answer/busy   Enzo Montgomery, RN,BSN,CCM [facility] Telephonic Care Management Coordinator Direct Phone: [phone] Toll Free: [phone] Fax: [phone]

## 2022-07-28 ENCOUNTER — Other Ambulatory Visit: Payer: Self-pay | Admitting: Cardiology

## 2022-08-05 ENCOUNTER — Ambulatory Visit: Payer: Medicare Other | Admitting: Orthopaedic Surgery

## 2022-08-05 DIAGNOSIS — I2111 ST elevation (STEMI) myocardial infarction involving right coronary artery: Secondary | ICD-10-CM | POA: Diagnosis not present

## 2022-08-06 ENCOUNTER — Ambulatory Visit (INDEPENDENT_AMBULATORY_CARE_PROVIDER_SITE_OTHER): Payer: Medicare Other | Admitting: Orthopaedic Surgery

## 2022-08-06 ENCOUNTER — Encounter: Payer: Self-pay | Admitting: Orthopaedic Surgery

## 2022-08-06 VITALS — BP 130/75 | HR 112 | Ht 67.0 in | Wt 175.0 lb

## 2022-08-06 DIAGNOSIS — M48062 Spinal stenosis, lumbar region with neurogenic claudication: Secondary | ICD-10-CM

## 2022-08-06 DIAGNOSIS — M76821 Posterior tibial tendinitis, right leg: Secondary | ICD-10-CM

## 2022-08-06 DIAGNOSIS — I2111 ST elevation (STEMI) myocardial infarction involving right coronary artery: Secondary | ICD-10-CM

## 2022-08-06 DIAGNOSIS — M76829 Posterior tibial tendinitis, unspecified leg: Secondary | ICD-10-CM | POA: Insufficient documentation

## 2022-08-06 NOTE — Progress Notes (Signed)
Office Visit Note   Patient: [name] Short           Date of Birth: September 12, 1946           MRN: [id number] Visit Date: 08/06/2022              Requested by: Sheliah Hatch, MD [address] [place],  [place] [address] PCP: Sheliah Hatch, MD   Assessment & Plan: Visit Diagnoses:  1. Spinal stenosis of lumbar region with neurogenic claudication   2. Posterior tibial tendinitis of right lower extremity     Plan: Continue Swede-O that was applied today for a few weeks then he can wean his way out.  This does not look like gout he can recheck his uric acid but still elevated he can always increase the allopurinol from 200  up to the 300 mg tablet daily.  If his uric acid is in the normal range then he can continue his current regiment.  We discussed posterior tibial tendinopathy.  I think this will settle down with a short course using Swede-O ankle brace.  If he has persistent problems he can return.  Currently he is able to ambulate short distances without claudication problems.  Once he is a year out from his stent he can always return discussed his lumbar spinal stenosis treatment options.  Follow-Up Instructions: Return if symptoms worsen or fail to improve.   Orders:  No orders of the defined types were placed in this encounter.  No orders of the defined types were placed in this encounter.     Procedures: No procedures performed   Clinical Data: No additional findings.   Subjective: Chief Complaint  Patient presents with   Lower Back - Pain   Left Hip - Pain   Left Ankle - Pain    HPI 76 year old male returns in is seen for follow-up of ongoing problems with the lumbar pain and spinal stenosis.  Patient had myocardial infarction and stenting and is prevented from elective surgery for 12 months.  He is ambulatory with a cane.  Had a history of gout started having pain in his right ankle along the medial aspect and posterior to the medial malleolus.  It  was not red but he thought it might be gout.  He had a Watchman device placed in his heart 07/17/2022.  He states in the past when he had surgery sometimes is triggered gout so he is concerned he might have gout in his ankle.  He is currently been on allopurinol last allopurinol level was in the sevens.  He has some lab work coming up shortly but seems to been controlled on 200 mg of allopurinol daily.  Renal function is normal.  Patient went to heart stride and was walking laps and started having some discomfort right laterally over his right hip where he had previous total of arthroplasty.  Patient still on Eliquis Plavix.  Review of Systems no current chest pain all other systems noncontributory other than as mentioned above.  Previous total hip arthroplasties history of gout.  Previous CVA.   Objective: Vital Signs: BP 130/75 (BP Location: Right Arm)   Pulse (!) 112   Ht 5\' 7"  (1.702 m)   Wt 175 lb (79.4 kg)   BMI 27.41 kg/m   Physical Exam Constitutional:      Appearance: He is well-developed.  HENT:     Head: Normocephalic and atraumatic.     Right Ear: External ear normal.  Left Ear: External ear normal.  Eyes:     Pupils: Pupils are equal, round, and reactive to light.  Neck:     Thyroid: No thyromegaly.     Trachea: No tracheal deviation.  Cardiovascular:     Rate and Rhythm: Normal rate.  Pulmonary:     Effort: Pulmonary effort is normal.     Breath sounds: No wheezing.  Abdominal:     General: Bowel sounds are normal.     Palpations: Abdomen is soft.  Musculoskeletal:     Cervical back: Neck supple.  Skin:    General: Skin is warm and dry.     Capillary Refill: Capillary refill takes less than 2 seconds.  Neurological:     Mental Status: He is alert and oriented to person, place, and time.  Psychiatric:        Behavior: Behavior normal.        Thought Content: Thought content normal.        Judgment: Judgment normal.     Ortho Exam tenderness of the  posterior tibial tendon right foot pain with resisted posterior tib testing.  No hindfoot valgus with standing.  No definite ankle effusion no increased warmth of the ankle good ankle range of motion.  Slight tenderness over the greater trochanter no pain with internal/external rotation of his hip.  Specialty Comments:  No specialty comments available.  Imaging: No results found.   PMFS History: Patient Active Problem List   Diagnosis Date Noted   Posterior tibial tendonitis 08/06/2022   Atrial fibrillation (HCC) 07/17/2022   Presence of Watchman left atrial appendage closure device 07/17/2022   Spinal stenosis of lumbar region 06/04/2022   Hematuria 05/16/2022   Coronary artery disease involving native coronary artery of native heart with unstable angina pectoris (HCC)    STEMI (ST elevation myocardial infarction) (HCC) 05/01/2022   Acute ST elevation myocardial infarction (STEMI) of inferior wall (HCC)    Recurrent kidney stones 08/14/2021   History of cerebrovascular accident (CVA) with residual deficit 02/11/2021   CVA (cerebral vascular accident) (HCC) 10/21/2020   Bilateral primary osteoarthritis of knee 02/15/2020   Acquired trigger finger of right little finger 11/03/2019   Encounter for orthopedic follow-up care 10/13/2019   H/O total hip arthroplasty, bilateral 10/24/2018   Gout 08/18/2017   Overweight (BMI 25.0-29.9) 01/23/2017   Foot pain, left 02/11/2016   Sebaceous cyst 07/14/2014   Sun-damaged skin 07/14/2014   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/14/2014   Physical exam, annual 05/17/2012   Inguinal hernia 08/12/2010   HYPERLIPIDEMIA TYPE I / IV 05/01/2010   TOBACCO USER 05/01/2010   Paroxysmal a-fib (HCC) 05/01/2010   ABNORMAL ELECTROCARDIOGRAM 03/26/2010   Past Medical History:  Diagnosis Date   Arthritis    WRIST   Benign localized prostatic hyperplasia with lower urinary tract symptoms (LUTS)    Complication of anesthesia    slow to wake   ED (erectile  dysfunction)    GERD (gastroesophageal reflux disease)    History of concussion    AS CHILD--  NO RESIDUAL   History of gout    History of kidney stones    Hyperlipidemia    Inguinal hernia, bilateral    Nephrolithiasis    BILATERAL   PAF (paroxysmal atrial fibrillation) (HCC) currently being followed by pcp   EPISODE --  2011  FOLLOW-UP W/ DR WALL  /  HAS NOT SEEN ANY CARDIOLOGIST SINCE   Presence of Watchman left atrial appendage closure device 07/17/2022   Watchman FLX  68mm with Dr. Lalla Brothers    Family History  Problem Relation Age of Onset   Diabetes Mother    Arthritis Mother    Rheum arthritis Mother    Dementia Mother    Heart disease Father    Prostate cancer Father    Diabetes Sister    Diabetes Maternal Grandmother    Diabetes Maternal Grandfather    Bipolar disorder Brother    Diabetes Brother    Dementia Brother    Neuropathy Brother     Past Surgical History:  Procedure Laterality Date   APPENDECTOMY  1983   CARDIOVASCULAR STRESS TEST  05-08-2010  DR WALL   NO EVIDENCE OF SCAR OR ISCHEMIA/ EF 54%/  PT HAD BOTH AFIB/ AFLUTTER DURING STUDY   CARPAL TUNNEL RELEASE Right 01/ 14/ 2021   CORONARY STENT INTERVENTION N/A 05/02/2022   Procedure: CORONARY STENT INTERVENTION;  Surgeon: Kathleene Hazel, MD;  Location: [facility];  Service: Cardiovascular;  Laterality: N/A;   CORONARY/GRAFT ACUTE MI REVASCULARIZATION N/A 05/01/2022   Procedure: Coronary/Graft Acute MI Revascularization;  Surgeon: Kathleene Hazel, MD;  Location: [facility];  Service: Cardiovascular;  Laterality: N/A;   CYSTOSCOPY W/ URETERAL STENT PLACEMENT Left 09/20/2013   Procedure: CYSTOSCOPY WITH STENT REPLACEMENT;  Surgeon: Antony Haste, MD;  [facility];  Service: Urology;  Laterality: Left;   CYSTOSCOPY WITH URETEROSCOPY Left 09/20/2013   Procedure: CYSTOSCOPY WITH URETEROSCOPY;  Surgeon: Antony Haste, MD;  Location: [facility];  Service: Urology;  Laterality: Left;   CYSTOSCOPY WITH URETEROSCOPY AND STENT PLACEMENT Left 07/12/2013   Procedure: CYSTOSCOPY WITH LITHOLAPEXY, LEFT URETERAL STENT PLACEMENT;  Surgeon: Antony Haste, MD;  Location: [facility];  Service: Urology;  Laterality: Left;   CYSTOSCOPY WITH URETEROSCOPY AND STENT PLACEMENT Bilateral 06/23/2014   Procedure: BILATERAL URETEROSCOPY, HOLMIUM LASER LITHOTRIPSY AND STENT PLACEMENT, RIGHT ;  Surgeon: Jerilee Field, MD;  Location: [facility];  Service: Urology;  Laterality: Bilateral;   EXTRACORPOREAL SHOCK WAVE LITHOTRIPSY  X2   EXTRACORPOREAL SHOCK WAVE LITHOTRIPSY Left 08-29-2013;   07-28-2013   EXTRACORPOREAL SHOCK WAVE LITHOTRIPSY Right 12/09/2018   Procedure: EXTRACORPOREAL SHOCK WAVE LITHOTRIPSY (ESWL);  Surgeon: Jerilee Field, MD;  Location: [facility];  Service: Urology;  Laterality: Right;   HOLMIUM LASER APPLICATION Left 09/20/2013   Procedure: HOLMIUM LASER APPLICATION;  Surgeon: Antony Haste, MD;  Location: [facility];  Service: Urology;  Laterality: Left;   INGUINAL HERNIA REPAIR Bilateral 10/16/2020   Procedure: BILATERAL OPEN INGUINAL HERNIA REPAIR WITH MESH;  Surgeon: Abigail Miyamoto, MD;  Location: [facility] ;  Service: General;  Laterality: Bilateral;  LMA/TAP BLOCK   LAPAROSCOPIC INGUINAL HERNIA REPAIR Bilateral 09-04-2010   w/ mesh   LEFT ATRIAL APPENDAGE OCCLUSION N/A 07/17/2022   Procedure: LEFT ATRIAL APPENDAGE OCCLUSION;  Surgeon: Lanier Prude, MD;  Location: [facility];  Service: Cardiovascular;  Laterality: N/A;   LEFT HEART CATH AND CORONARY ANGIOGRAPHY N/A 05/01/2022   Procedure: LEFT HEART CATH AND CORONARY ANGIOGRAPHY;  Surgeon: Kathleene Hazel, MD;  Location: [facility];  Service: Cardiovascular;  Laterality: N/A;   TEE WITHOUT CARDIOVERSION N/A 07/17/2022   Procedure: TRANSESOPHAGEAL  ECHOCARDIOGRAM (TEE);  Surgeon: Lanier Prude, MD;  Location: [facility];  Service: Cardiovascular;  Laterality: N/A;   TONSILLECTOMY  AS CHILD   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   TOTAL HIP ARTHROPLASTY Right 11/15/2012  Procedure: RIGHT TOTAL HIP ARTHROPLASTY ANTERIOR APPROACH;  Surgeon: Eldred Manges, MD;  Location: [facility];  Service: Orthopedics;  Laterality: Right;  Right total hip arthroplasty   TRANSTHORACIC ECHOCARDIOGRAM  05-08-2010   NORMAL LVSF/  EF 55%/  GRADE I DIASTOLIC DYSFUNCTION/  MILD BILATERAL ATRIUM ENLARGEMENT   Social History   Occupational History   Occupation: retired  Tobacco Use   Smoking status: Light Smoker    Types: Cigars   Smokeless tobacco: Never   Tobacco comments:    AVERAGE 3 CIGARS PER WEEK  Vaping Use   Vaping Use: Never used  Substance and Sexual Activity   Alcohol use: Not Currently    Comment: occasional   Drug use: No   Sexual activity: Not on file

## 2022-08-07 ENCOUNTER — Other Ambulatory Visit: Payer: Self-pay

## 2022-08-07 DIAGNOSIS — M1000 Idiopathic gout, unspecified site: Secondary | ICD-10-CM

## 2022-08-07 MED ORDER — ALLOPURINOL 100 MG PO TABS: 200.0000 mg | ORAL_TABLET | Freq: Every day | ORAL | 1 refills | Status: DC

## 2022-08-08 DIAGNOSIS — R3914 Feeling of incomplete bladder emptying: Secondary | ICD-10-CM | POA: Diagnosis not present

## 2022-08-08 DIAGNOSIS — N200 Calculus of kidney: Secondary | ICD-10-CM | POA: Diagnosis not present

## 2022-08-08 DIAGNOSIS — N401 Enlarged prostate with lower urinary tract symptoms: Secondary | ICD-10-CM | POA: Diagnosis not present

## 2022-08-11 ENCOUNTER — Telehealth: Payer: Self-pay | Admitting: Orthopaedic Surgery

## 2022-08-11 MED ORDER — PREDNISONE 10 MG (21) PO TBPK: ORAL_TABLET | ORAL | 0 refills | Status: DC

## 2022-08-11 NOTE — Telephone Encounter (Signed)
Sent to pharmacy. I called Dawn and advised.

## 2022-08-11 NOTE — Telephone Encounter (Signed)
Pt's wife Alvis Lemmings called stating pt had an flare up for gout and asking for a script of prednisone. She states he had an flare up after appt. Please send to pharmacy on file. Pt phone number is [phone] or [phone].

## 2022-08-11 NOTE — Telephone Encounter (Signed)
Please advise 

## 2022-08-14 DIAGNOSIS — I2111 ST elevation (STEMI) myocardial infarction involving right coronary artery: Secondary | ICD-10-CM | POA: Diagnosis not present

## 2022-08-15 ENCOUNTER — Ambulatory Visit (INDEPENDENT_AMBULATORY_CARE_PROVIDER_SITE_OTHER): Payer: Medicare Other | Admitting: Family Medicine

## 2022-08-15 ENCOUNTER — Other Ambulatory Visit: Payer: Self-pay | Admitting: Cardiology

## 2022-08-15 ENCOUNTER — Encounter: Payer: Self-pay | Admitting: Family Medicine

## 2022-08-15 VITALS — BP 120/80 | HR 54 | Temp 99.0°F | Resp 17 | Ht 67.0 in | Wt 174.2 lb

## 2022-08-15 DIAGNOSIS — Z125 Encounter for screening for malignant neoplasm of prostate: Secondary | ICD-10-CM

## 2022-08-15 DIAGNOSIS — M1A0710 Idiopathic chronic gout, right ankle and foot, without tophus (tophi): Secondary | ICD-10-CM | POA: Diagnosis not present

## 2022-08-15 DIAGNOSIS — I2111 ST elevation (STEMI) myocardial infarction involving right coronary artery: Secondary | ICD-10-CM | POA: Diagnosis not present

## 2022-08-15 DIAGNOSIS — E783 Hyperchylomicronemia: Secondary | ICD-10-CM | POA: Diagnosis not present

## 2022-08-15 DIAGNOSIS — I4891 Unspecified atrial fibrillation: Secondary | ICD-10-CM | POA: Diagnosis not present

## 2022-08-15 LAB — LIPID PANEL
Cholesterol: 104 mg/dL (ref 0–200)
HDL: 38 mg/dL — ABNORMAL LOW (ref >39.00)
LDL Cholesterol: 33 mg/dL (ref 0–99)
NonHDL: 65.9
Total CHOL/HDL Ratio: 3
Triglycerides: 164 mg/dL — ABNORMAL HIGH (ref 0.0–149.0)
VLDL: 32.8 mg/dL (ref 0.0–40.0)

## 2022-08-15 LAB — CBC WITH DIFFERENTIAL/PLATELET
Basophils Absolute: 0 10*3/uL (ref 0.0–0.1)
Basophils Relative: 0.1 % (ref 0.0–3.0)
Eosinophils Absolute: 0 10*3/uL (ref 0.0–0.7)
Eosinophils Relative: 0 % (ref 0.0–5.0)
HCT: 39.1 % (ref 39.0–52.0)
Hemoglobin: 12.8 g/dL — ABNORMAL LOW (ref 13.0–17.0)
Lymphocytes Relative: 13.9 % (ref 12.0–46.0)
Lymphs Abs: 1.9 10*3/uL (ref 0.7–4.0)
MCHC: 32.7 g/dL (ref 30.0–36.0)
MCV: 89.9 fl (ref 78.0–100.0)
Monocytes Absolute: 0.7 10*3/uL (ref 0.1–1.0)
Monocytes Relative: 5.4 % (ref 3.0–12.0)
Neutro Abs: 11.2 10*3/uL — ABNORMAL HIGH (ref 1.4–7.7)
Neutrophils Relative %: 80.6 % — ABNORMAL HIGH (ref 43.0–77.0)
Platelets: 307 10*3/uL (ref 150.0–400.0)
RBC: 4.35 Mil/uL (ref 4.22–5.81)
RDW: 14.6 % (ref 11.5–15.5)
WBC: 13.8 10*3/uL — ABNORMAL HIGH (ref 4.0–10.5)

## 2022-08-15 LAB — HEPATIC FUNCTION PANEL
ALT: 18 U/L (ref 0–53)
AST: 16 U/L (ref 0–37)
Albumin: 4.4 g/dL (ref 3.5–5.2)
Alkaline Phosphatase: 70 U/L (ref 39–117)
Bilirubin, Direct: 0.1 mg/dL (ref 0.0–0.3)
Total Bilirubin: 0.3 mg/dL (ref 0.2–1.2)
Total Protein: 7.3 g/dL (ref 6.0–8.3)

## 2022-08-15 LAB — URIC ACID: Uric Acid, Serum: 4.5 mg/dL (ref 4.0–7.8)

## 2022-08-15 LAB — BASIC METABOLIC PANEL
BUN: 25 mg/dL — ABNORMAL HIGH (ref 6–23)
CO2: 30 mEq/L (ref 19–32)
Calcium: 9.7 mg/dL (ref 8.4–10.5)
Chloride: 102 mEq/L (ref 96–112)
Creatinine, Ser: 1.07 mg/dL (ref 0.40–1.50)
GFR: 68.18 mL/min (ref >60.00)
Glucose, Bld: 113 mg/dL — ABNORMAL HIGH (ref 70–99)
Potassium: 4.8 mEq/L (ref 3.5–5.1)
Sodium: 139 mEq/L (ref 135–145)

## 2022-08-15 LAB — PSA, MEDICARE: PSA: 2.42 ng/ml (ref 0.10–4.00)

## 2022-08-15 NOTE — Assessment & Plan Note (Signed)
Chronic problem.  On Crestor 40mg  w/o difficulty.  Check labs.  Adjust meds prn

## 2022-08-15 NOTE — Assessment & Plan Note (Signed)
Chronic problem.  Pt currently on Allopurinol 200mg  daily.  Had flare last week and is currently on Prednisone.  Check uric acid level and adjust meds prn.

## 2022-08-15 NOTE — Assessment & Plan Note (Signed)
Chronic problem.  Had Watchman procedure on 10/19.  Pt reports doing well.  He is asymptomatic but not able to feel his Afib prior to the procedure.  Currently on Eliquis but plan is to d/c once Watchman is stable.  Will follow.

## 2022-08-15 NOTE — Patient Instructions (Addendum)
Follow up in 6 months to recheck cholesterol We'll notify you of your lab results and make any changes if needed Keep up the good work on healthy diet and regular exercise- you look great! Call with any questions or concerns Stay Safe!  Stay Healthy! Happy Holidays! 

## 2022-08-15 NOTE — Progress Notes (Signed)
[facility]                                     Cardiology Office Note:    Date:  08/26/2022   ID:  [name] Short, DOB 11/08/1945, MRN [id number]  PCP:  Midge Minium, MD  [facility] Cardiologist:  Donato Heinz, MD  [facility] Electrophysiologist:  Vickie Epley, MD   Referring MD: Midge Minium, MD   Chief Complaint  Patient presents with   Follow-up    S/p LAAO    History of Present Illness:    [name] Short is a 76 y.o. male with a hx of  HTN, HLD, CVA, CAD s/p STEMI with LAD stent placement 05/02/22 (currently on Plavix and Eliquis), PAF with intermittent hematuria on anticoagulation who is being seen today for post Watchman follow up.    [name] presented to the ED with slurred speech and left hand weakness in 09/2020, found to have acute CVA.  MRI showed acute infarct of the posterior right frontal lobe. Echocardiogram at that time showed normal EF. He was started on Eliquis 5 mg twice a day as the stroke was felt to be related to atrial fibrillation. Heart monitor showed 18% A-fib burden with longest episode lasting 1 day and 7 hours, average heart rate 80 bpm.  He was then seen by Dr. Gardiner Rhyme and was referred to Dr. Quentin Ore for evaluation of Watchman procedure.     More recently, he presented to the hospital on 05/01/2022 with chest pain, shortness of breath and transient diaphoresis. EKG showed ST elevations in inferior leads with reciprocal changes in V1 and V2. Initial cardiac catheterization revealed 100% distal RCA occlusion treated with PTCA and DES x1, 95% mid LAD lesion, 80% mid LAD lesion. Patient was brought back to the Cath Lab on the following day on 05/02/2022 and underwent DES to mid LAD.  Postprocedure, he was started on aspirin, Plavix and Eliquis with plan to discontinue aspirin after 1 month.   He was seen in cardiology follow up and was noted to be doing well from a CV standpoint. Dr. Quentin Ore saw him on  07/04/22 with plans to proceed with LAAO closure. He is now s/p procedure with Watchman FLX 57mm device. He was restarted on Eliquis 5mg  and Plavix 75mg  with plans for a repeat CT at approximately 60 days to ensure proper seal of the device.   Today he is here and is doing well from a CV standpoint. He is tolerating medications with no concern for bleeding in stool or urine. He denies chest pain, palpitations, LE edema, orthopnea, dizziness, or syncope.   Past Medical History:  Diagnosis Date   Arthritis    WRIST   Benign localized prostatic hyperplasia with lower urinary tract symptoms (LUTS)    Complication of anesthesia    slow to wake   ED (erectile dysfunction)    GERD (gastroesophageal reflux disease)    History of concussion    AS CHILD--  NO RESIDUAL   History of gout    History of kidney stones    Hyperlipidemia    Inguinal hernia, bilateral    Nephrolithiasis    BILATERAL   PAF (paroxysmal atrial fibrillation) ([name]) currently being followed by pcp   EPISODE --  2011  FOLLOW-UP W/ DR WALL  /  HAS NOT SEEN ANY CARDIOLOGIST SINCE   Presence of  Watchman left atrial appendage closure device 07/17/2022   Watchman FLX 33mm with Dr. Quentin Ore    Past Surgical History:  Procedure Laterality Date   APPENDECTOMY  1983   CARDIOVASCULAR STRESS TEST  05-08-2010  DR WALL   NO EVIDENCE OF SCAR OR ISCHEMIA/ EF 54%/  PT HAD BOTH AFIB/ AFLUTTER DURING STUDY   CARPAL TUNNEL RELEASE Right 01/ 14/ 2021   CORONARY STENT INTERVENTION N/A 05/02/2022   Procedure: CORONARY STENT INTERVENTION;  Surgeon: Burnell Blanks, MD;  Location: [place] [facility];  Service: Cardiovascular;  Laterality: N/A;   CORONARY/GRAFT ACUTE MI REVASCULARIZATION N/A 05/01/2022   Procedure: Coronary/Graft Acute MI Revascularization;  Surgeon: Burnell Blanks, MD;  Location: [place] [facility];  Service: Cardiovascular;  Laterality: N/A;   CYSTOSCOPY W/ URETERAL STENT PLACEMENT Left 09/20/2013   Procedure:  CYSTOSCOPY WITH STENT REPLACEMENT;  Surgeon: Fredricka Bonine, MD;  Location: [facility];  Service: Urology;  Laterality: Left;   CYSTOSCOPY WITH URETEROSCOPY Left 09/20/2013   Procedure: CYSTOSCOPY WITH URETEROSCOPY;  Surgeon: Fredricka Bonine, MD;  Location: [facility];  Service: Urology;  Laterality: Left;   CYSTOSCOPY WITH URETEROSCOPY AND STENT PLACEMENT Left 07/12/2013   Procedure: CYSTOSCOPY WITH LITHOLAPEXY, LEFT URETERAL STENT PLACEMENT;  Surgeon: Fredricka Bonine, MD;  Location: [facility];  Service: Urology;  Laterality: Left;   CYSTOSCOPY WITH URETEROSCOPY AND STENT PLACEMENT Bilateral 06/23/2014   Procedure: BILATERAL URETEROSCOPY, HOLMIUM LASER LITHOTRIPSY AND STENT PLACEMENT, RIGHT ;  Surgeon: Festus Aloe, MD;  Location: [facility];  Service: Urology;  Laterality: Bilateral;   EXTRACORPOREAL SHOCK WAVE LITHOTRIPSY  X2   EXTRACORPOREAL SHOCK WAVE LITHOTRIPSY Left 08-29-2013;   07-28-2013   EXTRACORPOREAL SHOCK WAVE LITHOTRIPSY Right 12/09/2018   Procedure: EXTRACORPOREAL SHOCK WAVE LITHOTRIPSY (ESWL);  Surgeon: Festus Aloe, MD;  Location: [facility];  Service: Urology;  Laterality: Right;   HOLMIUM LASER APPLICATION Left [id number]   Procedure: HOLMIUM LASER APPLICATION;  Surgeon: Fredricka Bonine, MD;  Location: [facility];  Service: Urology;  Laterality: Left;   INGUINAL HERNIA REPAIR Bilateral 10/16/2020   Procedure: BILATERAL OPEN INGUINAL HERNIA REPAIR WITH MESH;  Surgeon: Coralie Keens, MD;  Location: [place];  Service: General;  Laterality: Bilateral;  LMA/TAP BLOCK   LAPAROSCOPIC INGUINAL HERNIA REPAIR Bilateral 09/04/2010   w/ mesh   LEFT ATRIAL APPENDAGE OCCLUSION N/A 07/17/2022   Procedure: LEFT ATRIAL APPENDAGE OCCLUSION;  Surgeon: Vickie Epley, MD;  Location: [place] [facility];  Service: Cardiovascular;  Laterality: N/A;   LEFT  HEART CATH AND CORONARY ANGIOGRAPHY N/A 05/01/2022   Procedure: LEFT HEART CATH AND CORONARY ANGIOGRAPHY;  Surgeon: Burnell Blanks, MD;  Location: [place] [facility];  Service: Cardiovascular;  Laterality: N/A;   TEE WITHOUT CARDIOVERSION N/A 07/17/2022   Procedure: TRANSESOPHAGEAL ECHOCARDIOGRAM (TEE);  Surgeon: Vickie Epley, MD;  Location: [place] [facility];  Service: Cardiovascular;  Laterality: N/A;   TONSILLECTOMY  AS CHILD   TOTAL HIP ARTHROPLASTY Left 05/11/2002   TOTAL HIP ARTHROPLASTY Right 11/15/2012   Procedure: RIGHT TOTAL HIP ARTHROPLASTY ANTERIOR APPROACH;  Surgeon: Marybelle Killings, MD;  Location: [place];  Service: Orthopedics;  Laterality: Right;  Right total hip arthroplasty   TRANSTHORACIC ECHOCARDIOGRAM  05/08/2010   NORMAL LVSF/  EF [id number]  GRADE I DIASTOLIC DYSFUNCTION/  MILD BILATERAL ATRIUM ENLARGEMENT   Watchemn device  07/07/2022    Current Medications: Current Meds  Medication Sig   acetaminophen (TYLENOL)  500 MG tablet Take 500-1,000 mg by mouth every 6 (six) hours as needed for moderate pain.   allopurinol (ZYLOPRIM) 100 MG tablet Take 2 tablets (200 mg total) by mouth daily.   apixaban (ELIQUIS) 5 MG TABS tablet Take 1 tablet (5 mg total) by mouth 2 (two) times daily.   clopidogrel (PLAVIX) 75 MG tablet Take 1 tablet (75 mg total) by mouth daily.   colchicine 0.6 MG tablet Take 0.6 mg by mouth daily as needed (gout).   melatonin 5 MG TABS Take 5 mg by mouth at bedtime as needed (sleep).   metoprolol tartrate (LOPRESSOR) 25 MG tablet TAKE 1 TABLET BY MOUTH TWICE A DAY   Multiple Vitamin (MULTIVITAMIN WITH MINERALS) TABS Take 1 tablet by mouth daily.   nitroGLYCERIN (NITROSTAT) 0.4 MG SL tablet Place 1 tablet (0.4 mg total) under the tongue every 5 (five) minutes as needed.   Polyethylene Glycol 400 (BLINK TEARS) 0.25 % SOLN Place 1 drop into both eyes 2 (two) times daily as needed (for dryness or irritation).   rosuvastatin (CRESTOR) 40 MG tablet TAKE 1  TABLET BY MOUTH DAILY   sodium chloride (OCEAN) 0.65 % SOLN nasal spray Place 1 spray into both nostrils as needed for congestion.   tadalafil (CIALIS) 5 MG tablet Take 5 mg by mouth daily as needed for erectile dysfunction.   trolamine salicylate (ASPERCREME) 10 % cream Apply 1 Application topically as needed for muscle pain (Heel).     Allergies:   Shellfish-derived products and Warfarin sodium   Social History   Socioeconomic History   Marital status: Married    Spouse name: Dawn   Number of children: Not on file   Years of education: Not on file   Highest education level: Not on file  Occupational History   Occupation: retired  Tobacco Use   Smoking status: Light Smoker    Types: Cigars   Smokeless tobacco: Never   Tobacco comments:    AVERAGE 3 CIGARS PER WEEK  Vaping Use   Vaping Use: Never used  Substance and Sexual Activity   Alcohol use: Not Currently    Comment: occasional   Drug use: No   Sexual activity: Not on file  Other Topics Concern   Not on file  Social History Narrative   Not on file   Social Determinants of Health   Financial Resource Strain: Low Risk  (05/08/2022)   Overall Financial Resource Strain (CARDIA)    Difficulty of Paying Living Expenses: Not hard at all  Food Insecurity: No Food Insecurity (07/22/2022)   Hunger Vital Sign    Worried About Running Out of Food in the Last Year: Never true    Ran Out of Food in the Last Year: Never true  Transportation Needs: No Transportation Needs (07/22/2022)   PRAPARE - Administrator, Civil Service (Medical): No    Lack of Transportation (Non-Medical): No  Physical Activity: Inactive (05/08/2022)   Exercise Vital Sign    Days of Exercise per Week: 0 days    Minutes of Exercise per Session: 0 min  Stress: No Stress Concern Present (05/08/2022)   [employer] of Occupational Health - Occupational Stress Questionnaire    Feeling of Stress : Not at all  Social Connections: Moderately  Isolated (05/08/2022)   Social Connection and Isolation Panel [NHANES]    Frequency of Communication with Friends and Family: Three times a week    Frequency of Social Gatherings with Friends and Family: Three times a  week    Attends Religious Services: Never    Active Member of Clubs or Organizations: No    Attends Music therapist: Never    Marital Status: Married     Family History: The patient's family history includes Arthritis in his mother; Bipolar disorder in his brother; Dementia in his brother and mother; Diabetes in his brother, maternal grandfather, maternal grandmother, mother, and sister; Heart disease in his father; Neuropathy in his brother; Prostate cancer in his father; Rheum arthritis in his mother.  ROS:   Please see the history of present illness.    All other systems reviewed and are negative.  EKGs/Labs/Other Studies Reviewed:    The following studies were reviewed today:  Procedures This Admission:  Transeptal Puncture Intra-procedural TEE which showed no LAA thrombus Left atrial appendage occlusive device placement on 07/17/22 by Dr. Quentin Ore.    Post Implant Anticoagulation Strategy: Continue Plavix 75mg  PO daily indefinitely. Continue Eliquis 5mg  PO BID x 45 days then stop. Plan for CT scan after 60 days to reassess Watchman positioning.    EKG:  EKG is not ordered today.    Recent Labs: 05/01/2022: B Natriuretic Peptide 64.5; TSH 2.377 08/15/2022: ALT 18; Hemoglobin 12.8; Platelets 307.0 08/18/2022: BUN 20; Creatinine, Ser 1.06; Potassium 3.8; Sodium 143   Recent Lipid Panel    Component Value Date/Time   CHOL 104 08/15/2022 0949   TRIG 164.0 (H) 08/15/2022 0949   HDL 38.00 (L) 08/15/2022 0949   CHOLHDL 3 08/15/2022 0949   VLDL 32.8 08/15/2022 0949   LDLCALC 33 08/15/2022 0949   LDLDIRECT 128.1 05/17/2012 0901   Physical Exam:    VS:  BP 102/72   Pulse 74   Ht 5\' 7"  (1.702 m)   Wt 174 lb 6.4 oz (79.1 kg)   SpO2 96%   BMI 27.31  kg/m     Wt Readings from Last 3 Encounters:  08/18/22 174 lb 6.4 oz (79.1 kg)  08/15/22 174 lb 4 oz (79 kg)  08/06/22 175 lb (79.4 kg)    General: Well developed, well nourished, NAD Lungs:Clear to ausculation bilaterally. No wheezes, rales, or rhonchi. Breathing is unlabored. Cardiovascular: RRR with S1 S2. No murmurs Extremities: No edema. Neuro: Alert and oriented. No focal deficits. No facial asymmetry. MAE spontaneously. Psych: Responds to questions appropriately with normal affect.    ASSESSMENT/PLAN:    Paroxysmal atrial fibrillation: s/p procedure with Watchman FLX 51mm device 07/17/22. He was restarted on Eliquis 5mg  and Plavix 75mg  with plans to continue Plavix indefinitely for his CAD. He will continue Eliquis until 45 days (08/31/22). He is scheduled for a repeat CT 12/19 to ensure proper seal of the device. He has 6 month follow up with myself and will see Dr. Gardiner Rhyme next month. He requires dental SBE until 12/2022.   HTN: Stable with no changes needed at this time.   HLD: Well controlled with last LDL at 33 on 08/15/22.   CVA: No new neuro changes. Continue current regimen.   CAD: s/p STEMI with LAD stent placement 05/02/22 currently treated with Plavix and Eliquis. Denies anginal symptoms.     Medication Adjustments/Labs and Tests Ordered: Current medicines are reviewed at length with the patient today.  Concerns regarding medicines are outlined above.  Orders Placed This Encounter  Procedures   Basic Metabolic Panel (BMET)   No orders of the defined types were placed in this encounter.   Patient Instructions  Medication Instructions:  Your physician recommends that you continue on your  current medications as directed. Please refer to the Current Medication list given to you today. Stop Eliquis on 08/31/22 *If you need a refill on your cardiac medications before your next appointment, please call your pharmacy*   Lab Work: Lab work to be done today--BMP If  you have labs (blood work) drawn today and your tests are completely normal, you will receive your results only by: [place] (if you have MyChart) OR A paper copy in the mail If you have any lab test that is abnormal or we need to change your treatment, we will call you to review the results.   Testing/Procedures: Your physician has requested that you have cardiac CT. Cardiac computed tomography (CT) is a painless test that uses an x-ray machine to take clear, detailed pictures of your heart. For further information please visit HugeFiesta.tn. Please follow instruction sheet as given.  Scheduled for 09/16/22   Follow-Up: At [facility], you and your health needs are our priority.  As part of our continuing mission to provide you with exceptional heart care, we have created designated Provider Care Teams.  These Care Teams include your primary Cardiologist (physician) and Advanced Practice Providers (APPs -  Physician Assistants and Nurse Practitioners) who all work together to provide you with the care you need, when you need it.  We recommend signing up for the patient portal called "MyChart".  Sign up information is provided on this After Visit Summary.  MyChart is used to connect with patients for Virtual Visits (Telemedicine).  Patients are able to view lab/test results, encounter notes, upcoming appointments, etc.  Non-urgent messages can be sent to your provider as well.   To learn more about what you can do with MyChart, go to [email or web address].    Your next appointment:   09/01/22  The format for your next appointment:   In Person  Provider:   Donato Heinz, MD     Other Instructions    Important Information About Sugar         Signed, Kathyrn Drown, NP  08/26/2022 8:53 AM    [place]

## 2022-08-15 NOTE — Progress Notes (Signed)
   Subjective:    Patient ID: [name] Short, male    DOB: November 26, 1945, 76 y.o.   MRN: [id number]  HPI Hyperlipidemia- chronic problem, on Crestor 40mg  daily.  No abd pain, N/V.  STEMI- pt had MI earlier this year.  Now on Metoprolol 25mg  BID, Plavix 75mg  daily, Crestor 40mg  daily, and Nitro prn.  No CP- has not had to use Nitro.  No SOB.  Afib- pt had Watchman procedure on 10/19.  He was not able to feel his Afib prior to surgery so is not sure if this has been successful.  Remains on Eliquis at this time but hope is to come off once Watchman is stable.  Gout- pt had a flare last week.  On Prednisone.     Review of Systems For ROS see HPI     Objective:   Physical Exam Vitals reviewed.  Constitutional:      General: He is not in acute distress.    Appearance: Normal appearance. He is well-developed. He is not ill-appearing.  HENT:     Head: Normocephalic and atraumatic.  Eyes:     Extraocular Movements: Extraocular movements intact.     Conjunctiva/sclera: Conjunctivae normal.     Pupils: Pupils are equal, round, and reactive to light.  Neck:     Thyroid: No thyromegaly.  Cardiovascular:     Rate and Rhythm: Normal rate and regular rhythm.     Pulses: Normal pulses.     Heart sounds: Normal heart sounds. No murmur heard. Pulmonary:     Effort: Pulmonary effort is normal. No respiratory distress.     Breath sounds: Normal breath sounds.  Abdominal:     General: Bowel sounds are normal. There is no distension.     Palpations: Abdomen is soft.  Musculoskeletal:     Cervical back: Normal range of motion and neck supple.     Right lower leg: No edema.     Left lower leg: No edema.  Lymphadenopathy:     Cervical: No cervical adenopathy.  Skin:    General: Skin is warm and dry.  Neurological:     General: No focal deficit present.     Mental Status: He is alert and oriented to person, place, and time.     Cranial Nerves: No cranial nerve deficit.  Psychiatric:         Mood and Affect: Mood normal.        Behavior: Behavior normal.           Assessment & Plan:

## 2022-08-15 NOTE — Assessment & Plan Note (Signed)
Occurred earlier this year.  Pt has been asymptomatic since.  Has not required nitro.  On metoprolol 25mg  BID, Plavix 75mg  daily, and Crestor 40mg  daily.  Following w/ cardiology.  Participating in Cardiac rehab.  Will continue to follow.

## 2022-08-17 ENCOUNTER — Encounter: Payer: Self-pay | Admitting: Cardiology

## 2022-08-18 ENCOUNTER — Encounter: Payer: Self-pay | Admitting: *Deleted

## 2022-08-18 ENCOUNTER — Telehealth: Payer: Self-pay

## 2022-08-18 ENCOUNTER — Ambulatory Visit: Payer: Medicare Other | Attending: Cardiology | Admitting: Cardiology

## 2022-08-18 VITALS — BP 102/72 | HR 74 | Ht 67.0 in | Wt 174.4 lb

## 2022-08-18 DIAGNOSIS — I25110 Atherosclerotic heart disease of native coronary artery with unstable angina pectoris: Secondary | ICD-10-CM | POA: Insufficient documentation

## 2022-08-18 DIAGNOSIS — I63411 Cerebral infarction due to embolism of right middle cerebral artery: Secondary | ICD-10-CM | POA: Diagnosis not present

## 2022-08-18 DIAGNOSIS — I2510 Atherosclerotic heart disease of native coronary artery without angina pectoris: Secondary | ICD-10-CM | POA: Insufficient documentation

## 2022-08-18 DIAGNOSIS — I2111 ST elevation (STEMI) myocardial infarction involving right coronary artery: Secondary | ICD-10-CM | POA: Diagnosis not present

## 2022-08-18 DIAGNOSIS — Z01812 Encounter for preprocedural laboratory examination: Secondary | ICD-10-CM | POA: Insufficient documentation

## 2022-08-18 DIAGNOSIS — I10 Essential (primary) hypertension: Secondary | ICD-10-CM | POA: Insufficient documentation

## 2022-08-18 DIAGNOSIS — I480 Paroxysmal atrial fibrillation: Secondary | ICD-10-CM | POA: Insufficient documentation

## 2022-08-18 DIAGNOSIS — Z95818 Presence of other cardiac implants and grafts: Secondary | ICD-10-CM | POA: Insufficient documentation

## 2022-08-18 NOTE — Patient Instructions (Signed)
Medication Instructions:  Your physician recommends that you continue on your current medications as directed. Please refer to the Current Medication list given to you today. Stop Eliquis on 08/31/22 *If you need a refill on your cardiac medications before your next appointment, please call your pharmacy*   Lab Work: Lab work to be done today--BMP If you have labs (blood work) drawn today and your tests are completely normal, you will receive your results only by: MyChart Message (if you have MyChart) OR A paper copy in the mail If you have any lab test that is abnormal or we need to change your treatment, we will call you to review the results.   Testing/Procedures: Your physician has requested that you have cardiac CT. Cardiac computed tomography (CT) is a painless test that uses an x-ray machine to take clear, detailed pictures of your heart. For further information please visit [email or web address] Please follow instruction sheet as given.  Scheduled for 09/16/22   Follow-Up: At [facility], you and your health needs are our priority.  As part of our continuing mission to provide you with exceptional heart care, we have created designated Provider Care Teams.  These Care Teams include your primary Cardiologist (physician) and Advanced Practice Providers (APPs -  Physician Assistants and Nurse Practitioners) who all work together to provide you with the care you need, when you need it.  We recommend signing up for the patient portal called "MyChart".  Sign up information is provided on this After Visit Summary.  MyChart is used to connect with patients for Virtual Visits (Telemedicine).  Patients are able to view lab/test results, encounter notes, upcoming appointments, etc.  Non-urgent messages can be sent to your provider as well.   To learn more about what you can do with MyChart, go to [email or web address].    Your next appointment:   09/01/22  The format for your next  appointment:   In Person  Provider:   Little Ishikawa, MD     Other Instructions    Important Information About Sugar

## 2022-08-18 NOTE — Telephone Encounter (Signed)
Left pt a VM stating lab results  

## 2022-08-18 NOTE — Telephone Encounter (Signed)
-----   Message from Sheliah Hatch, MD sent at 08/18/2022  7:41 AM EST ----- Labs look great w/ exception of mildly elevated sugar and white blood cell count- both is which is caused by the prednisone you are taking.  No cause for concern

## 2022-08-18 NOTE — Telephone Encounter (Signed)
-----   Message from Katherine E Tabori, MD sent at 08/18/2022  7:41 AM EST ----- Labs look great w/ exception of mildly elevated sugar and white blood cell count- both is which is caused by the prednisone you are taking.  No cause for concern 

## 2022-08-19 LAB — BASIC METABOLIC PANEL
BUN/Creatinine Ratio: 19 (ref 10–24)
BUN: 20 mg/dL (ref 8–27)
CO2: 23 mmol/L (ref 20–29)
Calcium: 9.7 mg/dL (ref 8.6–10.2)
Chloride: 103 mmol/L (ref 96–106)
Creatinine, Ser: 1.06 mg/dL (ref 0.76–1.27)
Glucose: 78 mg/dL (ref 70–99)
Potassium: 3.8 mmol/L (ref 3.5–5.2)
Sodium: 143 mmol/L (ref 134–144)
eGFR: 74 mL/min/{1.73_m2} (ref >59)

## 2022-08-20 DIAGNOSIS — I2111 ST elevation (STEMI) myocardial infarction involving right coronary artery: Secondary | ICD-10-CM | POA: Diagnosis not present

## 2022-08-23 DIAGNOSIS — Z23 Encounter for immunization: Secondary | ICD-10-CM | POA: Diagnosis not present

## 2022-08-25 DIAGNOSIS — I2111 ST elevation (STEMI) myocardial infarction involving right coronary artery: Secondary | ICD-10-CM | POA: Diagnosis not present

## 2022-08-27 ENCOUNTER — Ambulatory Visit: Payer: Medicare Other | Admitting: Cardiology

## 2022-08-27 DIAGNOSIS — I2111 ST elevation (STEMI) myocardial infarction involving right coronary artery: Secondary | ICD-10-CM | POA: Diagnosis not present

## 2022-08-28 DIAGNOSIS — I2111 ST elevation (STEMI) myocardial infarction involving right coronary artery: Secondary | ICD-10-CM | POA: Diagnosis not present

## 2022-08-31 NOTE — Progress Notes (Unsigned)
Cardiology Office Note:    Date:  09/01/2022   ID:  [name], DOB [birth year], MRN [id number]  PCP:  Sheliah Hatch, MD  Cardiologist:  Little Ishikawa, MD  Electrophysiologist:  Lanier Prude, MD   Referring MD: Sheliah Hatch, MD   Chief Complaint  Patient presents with   Coronary Artery Disease     History of Present Illness:    [name] Short is a 76 y.o. 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  He was initially seen on 11/27/2020.  He has a history of paroxysmal atrial fibrillation but has not been on anticoagulation.  He presented to the ED with slurred speech and left hand weakness on 10/21/20, was found to have acute CVA.  MRI brain showed acute infarct in the posterior right frontal lobe.  He had residual left-sided weakness and dysarthria.  Echocardiogram showed normal LVEF.  He was started on Eliquis 5 mg twice daily, stroke likely secondary to atrial fibrillation.  He had paroxysmal atrial fibrillation while in the hospital, heart rate 90s to 100s while in A. fib.  He was started on Cardizem.  He presented to [facility]/01[facility]22 with chest pain, found to have inferior STEMI.  Cath showed occluded distal LAD, treated with DES x1.  Also noted to have severe stenosis in mid LAD for which he underwent staged intervention with DES x1.  Plan was for triple therapy for 1 month with aspirin, Plavix, and Eliquis then stop aspirin.  Echocardiogram showed EF 50 to 55%.  Zio patch x14 days showed 18% A. fib burden with longest episode lasting 1 day 7 hours, average heart rate in A. fib 80 bpm.  Also showed 5 episodes of SVT, longest lasting 18 seconds.  He had ongoing issues with hematuria and was referred to Dr. Lalla Brothers for [place] evaluation.  Underwent successful watchman placement 06/2022.  Since last clinic visit, he reports that he is doing okay.  Denies any chest  pain,  lightheadedness, syncope, lower extremity edema, or palpitations.  Reports some dyspnea with exertion.  Stopped Eliquis yesterday.  No hematuria recently.     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 Lalla Brothers    Past Surgical History:  Procedure Laterality Date   APPENDECTOMY  1983   CARDIOVASCULAR STRESS TEST  05-08-2010  DR WALL   NO EVIDENCE OF SCAR OR ISCHEMIA/ EF 54%/  PT HAD BOTH AFIB/ AFLUTTER DURING STUDY   CARPAL TUNNEL RELEASE Right 01/ 14/ 2021   CORONARY STENT INTERVENTION N/A 05/02/2022   Procedure: CORONARY STENT INTERVENTION;  Surgeon: Kathleene Hazel, MD;  [facility];  Service: Cardiovascular;  Laterality: N/A;   CORONARY/GRAFT ACUTE MI REVASCULARIZATION N/A 05/01/2022   Procedure: Coronary/Graft Acute MI Revascularization;  Surgeon: Kathleene Hazel, MD;  Location: [facility];  Service: Cardiovascular;  Laterality: N/A;   CYSTOSCOPY W/ URETERAL STENT PLACEMENT Left 09/20/2013   Procedure: CYSTOSCOPY WITH STENT REPLACEMENT;  Surgeon: Antony Haste, MD;  Location: [facility];  Service: Urology;  Laterality: Left;   CYSTOSCOPY WITH URETEROSCOPY Left 09/20/2013   Procedure: CYSTOSCOPY WITH URETEROSCOPY;  Surgeon: Antony Haste, MD;  Location:  [facility];  Service: Urology;  Laterality: Left;   CYSTOSCOPY WITH URETEROSCOPY AND STENT PLACEMENT Left 07/12/2013   Procedure: CYSTOSCOPY WITH LITHOLAPEXY, LEFT URETERAL STENT PLACEMENT;  Surgeon: Antony Haste, MD;  Location: [facility]  Service: Urology;  Laterality: Left;   CYSTOSCOPY WITH URETEROSCOPY AND STENT PLACEMENT Bilateral 06/23/2014   Procedure: BILATERAL URETEROSCOPY, HOLMIUM LASER LITHOTRIPSY AND STENT PLACEMENT, RIGHT ;  Surgeon: Jerilee Field, MD;  Location: [facility];  Service: Urology;  Laterality: Bilateral;   EXTRACORPOREAL SHOCK WAVE LITHOTRIPSY  X2   EXTRACORPOREAL SHOCK WAVE LITHOTRIPSY Left 08-29-2013;   07-28-2013   EXTRACORPOREAL SHOCK WAVE LITHOTRIPSY Right 12/09/2018   Procedure: EXTRACORPOREAL SHOCK WAVE LITHOTRIPSY (ESWL);  Surgeon: Jerilee Field, MD;  Location: [facility];  Service: Urology;  Laterality: Right;   HOLMIUM LASER APPLICATION Left 09/20/2013   Procedure: HOLMIUM LASER APPLICATION;  Surgeon: Antony Haste, MD;  Location: [facility];  Service: Urology;  Laterality: Left;   INGUINAL HERNIA REPAIR Bilateral 10/16/2020   Procedure: BILATERAL OPEN INGUINAL HERNIA REPAIR WITH MESH;  Surgeon: Abigail Miyamoto, MD;  Location: [facility];  Service: General;  Laterality: Bilateral;  LMA/TAP BLOCK   LAPAROSCOPIC INGUINAL HERNIA REPAIR Bilateral 09/04/2010   w/ mesh   LEFT ATRIAL APPENDAGE OCCLUSION N/A 07/17/2022   Procedure: LEFT ATRIAL APPENDAGE OCCLUSION;  Surgeon: Lanier Prude, MD;  [facility];  Service: Cardiovascular;  Laterality: N/A;   LEFT HEART CATH AND CORONARY ANGIOGRAPHY N/A 05/01/2022   Procedure: LEFT HEART CATH AND CORONARY ANGIOGRAPHY;  Surgeon: Kathleene Hazel, MD;  [facility];  Service: Cardiovascular;  Laterality: N/A;   TEE WITHOUT CARDIOVERSION N/A 07/17/2022   Procedure:  TRANSESOPHAGEAL ECHOCARDIOGRAM (TEE);  Surgeon: Lanier Prude, MD;  Location: [facility];  Service: Cardiovascular;  Laterality: N/A;   TONSILLECTOMY  AS CHILD   TOTAL HIP ARTHROPLASTY Left 05/11/2002   TOTAL HIP ARTHROPLASTY Right 11/15/2012   Procedure: RIGHT TOTAL HIP ARTHROPLASTY ANTERIOR APPROACH;  Surgeon: Eldred Manges, MD;  Location: [facility];  Service: Orthopedics;  Laterality: Right;  Right total hip arthroplasty   TRANSTHORACIC ECHOCARDIOGRAM  05/08/2010   NORMAL LVSF/  EF 55%/  GRADE I DIASTOLIC DYSFUNCTION/  MILD BILATERAL ATRIUM ENLARGEMENT   [place] device  07/07/2022    Current Medications: Current Meds  Medication Sig   acetaminophen (TYLENOL) 500 MG tablet Take 500-1,000 mg by mouth every 6 (six) hours as needed for moderate pain.   allopurinol (ZYLOPRIM) 100 MG tablet Take 2 tablets (200 mg total) by mouth daily.   aspirin EC 81 MG tablet Take 1 tablet (81 mg total) by mouth daily. Swallow whole.   clopidogrel (PLAVIX) 75 MG tablet Take 1 tablet (75 mg total) by mouth daily.   colchicine 0.6 MG tablet Take 0.6 mg by mouth daily as needed (gout).   melatonin 5 MG TABS Take 5 mg by mouth  at bedtime as needed (sleep).   metoprolol tartrate (LOPRESSOR) 25 MG tablet TAKE 1 TABLET BY MOUTH TWICE A DAY   Multiple Vitamin (MULTIVITAMIN WITH MINERALS) TABS Take 1 tablet by mouth daily.   nitroGLYCERIN (NITROSTAT) 0.4 MG SL tablet Place 1 tablet (0.4 mg total) under the tongue every 5 (five) minutes as needed.   Polyethylene Glycol 400 (BLINK TEARS) 0.25 % SOLN Place 1 drop into both eyes 2 (two) times daily as needed (for dryness or irritation).   rosuvastatin (CRESTOR) 40 MG tablet TAKE 1 TABLET BY MOUTH DAILY   sodium chloride (OCEAN) 0.65 % SOLN nasal spray Place 1 spray into both nostrils as needed for congestion.   tadalafil (CIALIS) 5 MG tablet Take 5 mg by mouth daily as needed for erectile dysfunction.   trolamine salicylate (ASPERCREME) 10 % cream Apply 1  Application topically as needed for muscle pain (Heel).     Allergies:   Shellfish-derived products and Warfarin sodium   Social History   Socioeconomic History   Marital status: Married    Spouse name: Dawn   Number of children: Not on file   Years of education: Not on file   Highest education level: Not on file  Occupational History   Occupation: retired  Tobacco Use   Smoking status: Light Smoker    Types: Cigars   Smokeless tobacco: Never   Tobacco comments:    AVERAGE 3 CIGARS PER WEEK  Vaping Use   Vaping Use: Never used  Substance and Sexual Activity   Alcohol use: Not Currently    Comment: occasional   Drug use: No   Sexual activity: Not on file  Other Topics Concern   Not on file  Social History Narrative   Not on file   Social Determinants of Health   Financial Resource Strain: Low Risk  (05/08/2022)   Overall Financial Resource Strain (CARDIA)    Difficulty of Paying Living Expenses: Not hard at all  Food Insecurity: No Food Insecurity (07/22/2022)   Hunger Vital Sign    Worried About Running Out of Food in the Last Year: Never true    Ran Out of Food in the Last Year: Never true  Transportation Needs: No Transportation Needs (07/22/2022)   PRAPARE - Administrator, Civil Service (Medical): No    Lack of Transportation (Non-Medical): No  Physical Activity: Inactive (05/08/2022)   Exercise Vital Sign    Days of Exercise per Week: 0 days    Minutes of Exercise per Session: 0 min  Stress: No Stress Concern Present (05/08/2022)   [employer] of Occupational Health - Occupational Stress Questionnaire    Feeling of Stress : Not at all  Social Connections: Moderately Isolated (05/08/2022)   Social Connection and Isolation Panel [NHANES]    Frequency of Communication with Friends and Family: Three times a week    Frequency of Social Gatherings with Friends and Family: Three times a week    Attends Religious Services: Never    Active Member of  Clubs or Organizations: No    Attends Engineer, structural: Never    Marital Status: Married     Family History: The patient's family history includes Arthritis in his mother; Bipolar disorder in his brother; Dementia in his brother and mother; Diabetes in his brother, maternal grandfather, maternal grandmother, mother, and sister; Heart disease in his father; Neuropathy in his brother; Prostate cancer in his father; Rheum arthritis in his mother.  ROS:   Please see  the history of present illness.     All other systems reviewed and are negative.  EKGs/Labs/Other Studies Reviewed:    The following studies were reviewed today:  14-Day Monitor 01/03/2021: 18% A. fib burden, longest episode lasting 1 day 7 hours. Average heart rate in A. fib 80 bpm 5 episodes of SVT, longest lasting 18 seconds with average rate 94 bpm   Patch Wear Time:  14 days and 0 hours (2022-03-17T12:53:47-0400 to 2022-03-31T12:53:51-0400)   Patient had a min HR of 41 bpm, max HR of 144 bpm, and avg HR of 61 bpm. Predominant underlying rhythm was Sinus Rhythm. 5 Supraventricular Tachycardia runs occurred, the run with the fastest interval lasting 14 beats with a max rate of 133 bpm, the  longest lasting 17.9 secs with an avg rate of 94 bpm. Atrial Fibrillation occurred (18% burden), ranging from 49-144 bpm (avg of 80 bpm), the longest lasting 1 day 7 hours with an avg rate of 83 bpm. Isolated SVEs were rare (<1.0%), SVE Couplets were  rare (<1.0%), and SVE Triplets were rare (<1.0%). Isolated VEs were rare (<1.0%), VE Couplets were rare (<1.0%), and no VE Triplets were present.  No patient triggered events.  Echo 10/23/2020: 1. Pt in atrial fibrillation at time of study.   2. Left ventricular ejection fraction, by estimation, is 60 to 65%. The  left ventricle has normal function. The left ventricle has no regional  wall motion abnormalities. There is mild left ventricular hypertrophy.  Left ventricular  diastolic function  could not be evaluated.   3. Right ventricular systolic function is normal. The right ventricular  size is normal. Tricuspid regurgitation signal is inadequate for assessing  PA pressure.   4. The mitral valve is normal in structure. Trivial mitral valve  regurgitation. No evidence of mitral stenosis.   5. The aortic valve is tricuspid. Aortic valve regurgitation is trivial.  No aortic stenosis is present.   6. The inferior vena cava is normal in size with greater than 50%  respiratory variability, suggesting right atrial pressure of 3 mmHg.   EKG:    04/04/2022: Normal sinus rhythm, rate 65, no ST abnormality 08/2021: sinus rhythm, rate 54,no ST abnormalities 02/27/2021: normal sinus rhythm, rate 52, Nonspecific T wave flattening 11/27/2020: normal sinus rhythm, rate 65, no ST abnormalities  Recent Labs: 05/01/2022: B Natriuretic Peptide 64.5; TSH 2.377 08/15/2022: ALT 18; Hemoglobin 12.8; Platelets 307.0 08/18/2022: BUN 20; Creatinine, Ser 1.06; Potassium 3.8; Sodium 143  Recent Lipid Panel    Component Value Date/Time   CHOL 104 08/15/2022 0949   TRIG 164.0 (H) 08/15/2022 0949   HDL 38.00 (L) 08/15/2022 0949   CHOLHDL 3 08/15/2022 0949   VLDL 32.8 08/15/2022 0949   LDLCALC 33 08/15/2022 0949   LDLDIRECT 128.1 05/17/2012 0901    Physical Exam:    VS:  BP 118/82 (BP Location: Left Arm, Patient Position: Sitting, Cuff Size: Normal)   Pulse 84   Ht 5\' 7"  (1.702 m)   Wt 175 lb 6.4 oz (79.6 kg)   SpO2 98%   BMI 27.47 kg/m     Wt Readings from Last 3 Encounters:  09/01/22 175 lb 6.4 oz (79.6 kg)  08/18/22 174 lb 6.4 oz (79.1 kg)  08/15/22 174 lb 4 oz (79 kg)     GEN:  in no acute distress HEENT: Normal NECK: No JVD; No carotid bruits CARDIAC: RRR, no murmurs, rubs, gallops RESPIRATORY:  Clear to auscultation without rales, wheezing or rhonchi  ABDOMEN: Soft, non-tender, non-distended  MUSCULOSKELETAL:  No edema; No deformity  SKIN: Warm and  dry NEUROLOGIC:  Alert and oriented x 3 PSYCHIATRIC:  Normal affect   ASSESSMENT:    1. Coronary artery disease involving native coronary artery of native heart without angina pectoris   2. PAF (paroxysmal atrial fibrillation) (HCC)   3. Cerebrovascular accident (CVA) due to embolism of right middle cerebral artery (HCC)   4. Hyperlipidemia, unspecified hyperlipidemia type   5. LVH (left ventricular hypertrophy)      PLAN:    CAD: He presented to [facility] 05/01/22 with chest pain, found to have inferior STEMI.  Cath showed occluded distal LAD, treated with DES x1.  Also noted to have severe stenosis in mid LAD for which he underwent staged intervention with DES x1.  Echocardiogram showed EF 50 to 55%. -Plan was for triple therapy for 1 month with aspirin, Plavix, and Eliquis then stop aspirin.  Subsequently underwent watchman placement 06/2022 so can stop Eliquis after 45 days (08/31/2022).  With stopping Eliquis would recommend restarting aspirin and would plan to complete 1 year of DAPT (through 04/2023) -Continue rosuvastatin 40 mg daily -Continue metoprolol 25 mg twice daily  Zio patch x14 days showed 18% A. fib burden with longest episode lasting 1 day 7 hours, average heart rate in A. fib 80 bpm.  Also showed 5 episodes of SVT, longest lasting 18 seconds.  He had ongoing issues with hematuria and was referred to Dr. Lalla Brothers for [facility] evaluation.  Underwent successful watchman placement 06/2022.  Paroxysmal atrial fibrillation: CHA2DS2-VASc score 4 (age, hypertension, CVA x2).  Echocardiogram 10/23/2020 showed normal biventricular function, no significant valvular disease.  Zio patch x14 days showed 18% A. fib burden with longest episode lasting 1 day 7 hours, average heart rate in A. fib 80 bpm.  Also showed 5 episodes of SVT, longest lasting 18 seconds.  He has had ongoing issues with hematuria due to kidney stones, for which he follows with urology.  Given this, was referred to Dr. Lalla Brothers  and underwent Watchman placement 06/2022. -Completed 45 days of Eliquis post watchman placement, can discontinue Eliquis -Continue metoprolol 25 mg BID -Sleep study shows mild OSA, no indication for CPAP at this time  LVH: Noted on echocardiogram, also has history of bilateral carpal tunnel syndrome.  Recommend CMR to screen for cardiac amyloidosis  CVA: Presented 10/21/2020 with left-sided weakness, found to have acute CVA.  Likely due to A. fib as above.   Hyperlipidemia: on rosuvastatin 40 mg daily.  LDL 33 on 08/15/2022  Hypertension: Continue metoprolol 25 mg twice daily.  Appears controlled  Snoring: Sleep study shows mild OSA, no indication for CPAP at this time  Hematuria: Likely due to kidney stones.  Follows with urology.   Given ongoing need for anticoagulation with atrial fibrillation and history of CVA, referred to Dr. Lalla Brothers and underwent watchman placement as above   RTC in 6 months    Medication Adjustments/Labs and Tests Ordered: Current medicines are reviewed at length with the patient today.  Concerns regarding medicines are outlined above.  Orders Placed This Encounter  Procedures   MR CARDIAC MORPHOLOGY W WO CONTRAST    Meds ordered this encounter  Medications   aspirin EC 81 MG tablet    Sig: Take 1 tablet (81 mg total) by mouth daily. Swallow whole.    Dispense:  90 tablet    Refill:  3     Patient Instructions  Medication Instructions:  START aspirin 81 mg daily  *If you need  a refill on your cardiac medications before your next appointment, please call your pharmacy*  Testing/Procedures: Your physician has requested that you have a cardiac MRI. Cardiac MRI uses a computer to create images of your heart as its beating, producing both still and moving pictures of your heart and major blood vessels. For further information please visit [email or web address]. Please follow the instruction sheet given to you today for more information.  Follow-Up: At  [facility], you and your health needs are our priority.  As part of our continuing mission to provide you with exceptional heart care, we have created designated Provider Care Teams.  These Care Teams include your primary Cardiologist (physician) and Advanced Practice Providers (APPs -  Physician Assistants and Nurse Practitioners) who all work together to provide you with the care you need, when you need it.  We recommend signing up for the patient portal called "MyChart".  Sign up information is provided on this After Visit Summary.  MyChart is used to connect with patients for Virtual Visits (Telemedicine).  Patients are able to view lab/test results, encounter notes, upcoming appointments, etc.  Non-urgent messages can be sent to your provider as well.   To learn more about what you can do with MyChart, go to [email or web address].    Your next appointment:   3 month(s)  The format for your next appointment:   In Person  Provider:   Little Ishikawa, MD     Other Instructions   You are scheduled for Cardiac MRI on ______________. Please arrive for your appointment at ______________ ( arrive 30-45 minutes prior to test start time). ?  [facility] [address] [place], [place] [address] [phone] Please take advantage of the free valet parking available at the MAIN entrance (A entrance).  Proceed to the [facility] Radiology Department (First Floor) for check-in.   OR   [facility] [address] [place], [place] [address] [phone] Please take advantage of the free valet parking available at the MAIN entrance. Proceed to [facility] registration for check-in (first floor).  Magnetic resonance imaging (MRI) is a painless test that produces images of the inside of the body without using Xrays.  During an MRI, strong magnets and radio waves work together in a Data processing manager to form detailed images.   MRI images may  provide more details about a medical condition than X-rays, CT scans, and ultrasounds can provide.  You may be given earphones to listen for instructions.  You may eat a light breakfast and take medications as ordered with the exception of HCTZ (fluid pill, other). Please avoid stimulants for 12 hr prior to test. (Ie. Caffeine, nicotine, chocolate, or antihistamine medications)  If a contrast material will be used, an IV will be inserted into one of your veins. Contrast material will be injected into your IV. It will leave your body through your urine within a day. You may be told to drink plenty of fluids to help flush the contrast material out of your system.  You will be asked to remove all metal, including: Watch, jewelry, and other metal objects including hearing aids, hair pieces and dentures. Also wearable glucose monitoring systems (ie. Freestyle Libre and Omnipods) (Braces and fillings normally are not a problem.)   TEST WILL TAKE APPROXIMATELY 1 HOUR  PLEASE NOTIFY SCHEDULING AT LEAST 24 HOURS IN ADVANCE IF YOU ARE UNABLE TO KEEP YOUR APPOINTMENT. [phone]  Please call Rockwell Alexandria, cardiac imaging nurse navigator with any  questions/concerns. Rockwell Alexandria RN Navigator Cardiac Imaging Larey Brick RN Navigator Cardiac Imaging [facility] [phone] Office          Signed, Little Ishikawa, MD  09/01/2022 5:35 PM    [facility]

## 2022-09-01 ENCOUNTER — Ambulatory Visit: Payer: Medicare Other | Attending: Cardiology | Admitting: Cardiology

## 2022-09-01 ENCOUNTER — Encounter: Payer: Self-pay | Admitting: Cardiology

## 2022-09-01 VITALS — BP 118/82 | HR 84 | Ht 67.0 in | Wt 175.4 lb

## 2022-09-01 DIAGNOSIS — E785 Hyperlipidemia, unspecified: Secondary | ICD-10-CM | POA: Diagnosis not present

## 2022-09-01 DIAGNOSIS — Z955 Presence of coronary angioplasty implant and graft: Secondary | ICD-10-CM | POA: Diagnosis not present

## 2022-09-01 DIAGNOSIS — I2510 Atherosclerotic heart disease of native coronary artery without angina pectoris: Secondary | ICD-10-CM

## 2022-09-01 DIAGNOSIS — I63411 Cerebral infarction due to embolism of right middle cerebral artery: Secondary | ICD-10-CM | POA: Diagnosis not present

## 2022-09-01 DIAGNOSIS — I480 Paroxysmal atrial fibrillation: Secondary | ICD-10-CM

## 2022-09-01 DIAGNOSIS — I517 Cardiomegaly: Secondary | ICD-10-CM

## 2022-09-01 DIAGNOSIS — I2111 ST elevation (STEMI) myocardial infarction involving right coronary artery: Secondary | ICD-10-CM | POA: Diagnosis not present

## 2022-09-01 MED ORDER — ASPIRIN 81 MG PO TBEC: 81.0000 mg | DELAYED_RELEASE_TABLET | Freq: Every day | ORAL | 3 refills | Status: AC

## 2022-09-01 NOTE — Patient Instructions (Signed)
Medication Instructions:  START aspirin 81 mg daily  *If you need a refill on your cardiac medications before your next appointment, please call your pharmacy*  Testing/Procedures: Your physician has requested that you have a cardiac MRI. Cardiac MRI uses a computer to create images of your heart as its beating, producing both still and moving pictures of your heart and major blood vessels. For further information please visit [email or web address]. Please follow the instruction sheet given to you today for more information.  Follow-Up: At [facility], you and your health needs are our priority.  As part of our continuing mission to provide you with exceptional heart care, we have created designated Provider Care Teams.  These Care Teams include your primary Cardiologist (physician) and Advanced Practice Providers (APPs -  Physician Assistants and Nurse Practitioners) who all work together to provide you with the care you need, when you need it.  We recommend signing up for the patient portal called "MyChart".  Sign up information is provided on this After Visit Summary.  MyChart is used to connect with patients for Virtual Visits (Telemedicine).  Patients are able to view lab/test results, encounter notes, upcoming appointments, etc.  Non-urgent messages can be sent to your provider as well.   To learn more about what you can do with MyChart, go to [email or web address].    Your next appointment:   3 month(s)  The format for your next appointment:   In Person  Provider:   Little Ishikawa, MD     Other Instructions   You are scheduled for Cardiac MRI on ______________. Please arrive for your appointment at ______________ ( arrive 30-45 minutes prior to test start time). ?  [facility] [address] [place], [place] [address] [phone] Please take advantage of the free valet parking available at the MAIN entrance (A entrance).  Proceed to the [facility] Radiology Department (First Floor) for check-in.   OR   [facility] [address] [place], [place] [address] [phone] Please take advantage of the free valet parking available at the MAIN entrance. Proceed to [facility] registration for check-in (first floor).  Magnetic resonance imaging (MRI) is a painless test that produces images of the inside of the body without using Xrays.  During an MRI, strong magnets and radio waves work together in a Data processing manager to form detailed images.   MRI images may provide more details about a medical condition than X-rays, CT scans, and ultrasounds can provide.  You may be given earphones to listen for instructions.  You may eat a light breakfast and take medications as ordered with the exception of HCTZ (fluid pill, other). Please avoid stimulants for 12 hr prior to test. (Ie. Caffeine, nicotine, chocolate, or antihistamine medications)  If a contrast material will be used, an IV will be inserted into one of your veins. Contrast material will be injected into your IV. It will leave your body through your urine within a day. You may be told to drink plenty of fluids to help flush the contrast material out of your system.  You will be asked to remove all metal, including: Watch, jewelry, and other metal objects including hearing aids, hair pieces and dentures. Also wearable glucose monitoring systems (ie. Freestyle Libre and Omnipods) (Braces and fillings normally are not a problem.)   TEST WILL TAKE APPROXIMATELY 1 HOUR  PLEASE NOTIFY SCHEDULING AT LEAST 24 HOURS IN ADVANCE IF YOU ARE UNABLE TO KEEP YOUR APPOINTMENT. [phone]  Please call Rockwell Alexandria, cardiac imaging nurse navigator with any questions/concerns. Rockwell Alexandria RN Navigator Cardiac Imaging Larey Brick RN Navigator Cardiac Imaging [facility] [phone] Office

## 2022-09-02 NOTE — Progress Notes (Unsigned)
[facility] [address] [place] [address] [phone]       STROKE FOLLOW UP NOTE  [name] Date of Birth:  09-13-1946 Medical Record Number:  [id number]   Reason for Referral: stroke follow up    SUBJECTIVE:   CHIEF COMPLAINT:  No chief complaint on file.    HPI:   Update 09/03/2022 [name] Short: Patient returns for yearly stroke follow-up.  Overall stable without new stroke/TIA symptoms.  Reports residual left hand weakness, stable since prior visit.  Patient had MI back in August and had Watchman procedure in October.  He is now off Eliquis and remains on Plavix and aspirin.  Closely followed by cardiology.  He also remains on Crestor.  Blood pressure well controlled.      History provided for reference purposes only Update 09/02/2021 [name] Short: Returns for 57-month stroke follow-up unaccompanied  Stable from stroke standpoint -denies new stroke/TIA symptoms Continued left hand pointer and middle finger "sticks of wood" which interferes with functioning and fine manipulation and very frustrating to patient and left face feels like swollen after eating (feels like no space in between his cheek and gums) can last up to an hour but does not see swelling when looking in a mirror Due to continued left hand numbness at prior visit - completed EMG/NCV 02/2021 which showed very mild left median neuropathy consistent with very mild left carpal tunnel syndrome and severe right median neuropathy at the wrist consistent with severe right carpal tunnel syndrome (consistent with prior diagnosis and history of surgery)  Compliant on Eliquis and Crestor -denies side effects Blood pressure today 131/82  No new concerns at this time  Update 02/26/2021 [name] Short: [name] Short returns for 11-month stroke follow-up accompanied by his wife  Doing well since prior visit without new stroke/TIA symptoms Residual left hand weakness with sensory impairment, left facial weakness  and mild dysarthria with improvement since prior visit and has since completed all therapies.  Concern of left hand sensory impairment possible residual stroke symptoms vs carpal tunnel.  Initially experienced numbness throughout his entire left hand and now only experiencing symptoms in his thumb, index finger and middle finger which can worsen with activity.  Denies any painful associated symptoms.  He did not experience any symptoms prior to his stroke.  He does have history of right carpal tunnel release in 03/2020.   Compliant on Eliquis and Crestor without associated side effects Blood pressure today 132/84  Also reports chronic lower back pain which does not radiate into his legs or denies sciatica type symptoms.  Used to treat pain with ibuprofen but has not been able to take since starting Eliquis.  Denies any progressive or worsening symptoms.  No further concerns at this time   Initial visit 11/26/2020 [name] Short: [name] Short is being seen for hospital follow-up accompanied by his wife.  Reports residual dysarthria (slight), left sided facial weakness, and LUE/hand weakness Currently working with [employer] PT/OT/SLP slowly progressing towards goals Initial improvement of ambulation since discharge and able to ambulate without AD but last week dx'd with right foot gout which impacted his ambulation and is currently using RW - gout pain has been slowly improving He does question potential return to driving Denies new or worsening stroke/TIA symptoms  Compliance on Eliquis 5 mg twice daily without bleeding or bruising Compliant on Crestor 20 mg daily without myalgias Blood pressure today 123/77 - does not routinely monitor at home as typically stable  Follow-up visit scheduled with  cardiology tomorrow No follow-up with PCP since discharge  No further concerns at this time  Stroke admission 10/21/2020 [name] Short is a 76 y.o. male with medical history significant of atrial fibrillation  (not on AC), HLD, gout, OA, BPH, GERD. He had bilateral inquinal hernia repair 10/16/20.  EKG 10/16/20 revealed A. Fib. On 10/21/2020, he presented with slurred speech and left  hand weakness that started on awakening from a nap.  Personally reviewed hospitalization pertinent progress notes, lab work and imaging with summary provided. Evaluated by Dr. 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 Initially diagnosed with PAF 2011 during stress test asymptomatic therefore placed on aspirin. Recommended initiating Eliquis 5 mg twice daily in setting of recent embolic stroke. LDL 98 and initiated Crestor 20 mg daily. A1c 5.6. Other stroke risk factors include advanced age and cigar smoker x15 yrs. Residual deficits of mild dysarthria, left facial weakness, and LUE weakness. Evaluated by therapies who recommended discharge home with outpatient PT/OT/SLP.  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  CTA neck No acute vascular finding within the neck. CTA head High-grade narrowing of right M3 branch with non opacification Distally. CT Perfusion Acute right frontal infarct, core of 6 mL. MRI Moderate acute infarct in the posterior right frontal lobe.  2D Echo EF 60 to 65%. In afib at time of study On aspirin 81 mg PTA. Now on Eliquis  5mg  BID  in setting of recent embolic stroke LDL 98 123456 5.6 Therapy recommendations: Outpatient PT/OT/SLP Disposition:  home     ROS:   14 system review of systems performed and negative with exception of those listed in HPI  PMH:  Past Medical History:  Diagnosis Date   Arthritis    WRIST   Benign localized prostatic hyperplasia with lower urinary tract symptoms (LUTS)    Complication of anesthesia    slow to wake   ED (erectile dysfunction)    GERD (gastroesophageal reflux  disease)    History of concussion    AS CHILD--  NO RESIDUAL   History of gout    History of kidney stones    Hyperlipidemia    Inguinal hernia, bilateral    Nephrolithiasis    BILATERAL   PAF (paroxysmal atrial fibrillation) ([place]) currently being followed by pcp   EPISODE --  2011  FOLLOW-UP W/ DR WALL  /  HAS NOT SEEN ANY CARDIOLOGIST SINCE   Presence of Watchman left atrial appendage closure device 07/17/2022   Watchman FLX 69mm with Dr. Quentin Ore    PSH:  Past Surgical History:  Procedure Laterality Date   APPENDECTOMY  1983   CARDIOVASCULAR STRESS TEST  05-08-2010  DR WALL   NO EVIDENCE OF SCAR OR ISCHEMIA/ EF 54%/  PT HAD BOTH AFIB/ AFLUTTER DURING STUDY   CARPAL TUNNEL RELEASE Right 01/ 14/ 2021   CORONARY STENT INTERVENTION N/A 05/02/2022   Procedure: CORONARY STENT INTERVENTION;  Surgeon: Burnell Blanks, MD;  Location: [place] [facility];  Service: Cardiovascular;  Laterality: N/A;   CORONARY/GRAFT ACUTE MI REVASCULARIZATION N/A 05/01/2022   Procedure: Coronary/Graft Acute MI Revascularization;  Surgeon: Burnell Blanks, MD;  Location: [place] [facility];  Service: Cardiovascular;  Laterality: N/A;   CYSTOSCOPY W/ URETERAL STENT PLACEMENT Left 09/20/2013   Procedure: CYSTOSCOPY WITH STENT REPLACEMENT;  Surgeon: Fredricka Bonine, MD;  Location: [place];  Service: Urology;  Laterality: Left;   CYSTOSCOPY WITH URETEROSCOPY Left 09/20/2013   Procedure: CYSTOSCOPY WITH URETEROSCOPY;  Surgeon: Fredricka Bonine, MD;  Location: [facility];  Service: Urology;  Laterality: Left;   CYSTOSCOPY WITH URETEROSCOPY AND STENT PLACEMENT Left 07/12/2013   Procedure: CYSTOSCOPY WITH LITHOLAPEXY, LEFT URETERAL STENT PLACEMENT;  Surgeon: Fredricka Bonine, MD;  Location: [facility];  Service: Urology;  Laterality: Left;   CYSTOSCOPY WITH URETEROSCOPY AND STENT PLACEMENT Bilateral 06/23/2014   Procedure:  BILATERAL URETEROSCOPY, HOLMIUM LASER LITHOTRIPSY AND STENT PLACEMENT, RIGHT ;  Surgeon: Festus Aloe, MD;  Location: [facility];  Service: Urology;  Laterality: Bilateral;   EXTRACORPOREAL SHOCK WAVE LITHOTRIPSY  X2   EXTRACORPOREAL SHOCK WAVE LITHOTRIPSY Left 08-29-2013;   07-28-2013   EXTRACORPOREAL SHOCK WAVE LITHOTRIPSY Right 12/09/2018   Procedure: EXTRACORPOREAL SHOCK WAVE LITHOTRIPSY (ESWL);  Surgeon: Festus Aloe, MD;  Location: [facility];  Service: Urology;  Laterality: Right;   HOLMIUM LASER APPLICATION Left [id number]   Procedure: HOLMIUM LASER APPLICATION;  Surgeon: Fredricka Bonine, MD;  Location: [facility];  Service: Urology;  Laterality: Left;   INGUINAL HERNIA REPAIR Bilateral 10/16/2020   Procedure: BILATERAL OPEN INGUINAL HERNIA REPAIR WITH MESH;  Surgeon: Coralie Keens, MD;  Location: [place];  Service: General;  Laterality: Bilateral;  LMA/TAP BLOCK   LAPAROSCOPIC INGUINAL HERNIA REPAIR Bilateral 09/04/2010   w/ mesh   LEFT ATRIAL APPENDAGE OCCLUSION N/A 07/17/2022   Procedure: LEFT ATRIAL APPENDAGE OCCLUSION;  Surgeon: Vickie Epley, MD;  Location: [place] [facility];  Service: Cardiovascular;  Laterality: N/A;   LEFT HEART CATH AND CORONARY ANGIOGRAPHY N/A 05/01/2022   Procedure: LEFT HEART CATH AND CORONARY ANGIOGRAPHY;  Surgeon: Burnell Blanks, MD;  Location: [place] [facility];  Service: Cardiovascular;  Laterality: N/A;   TEE WITHOUT CARDIOVERSION N/A 07/17/2022   Procedure: TRANSESOPHAGEAL ECHOCARDIOGRAM (TEE);  Surgeon: Vickie Epley, MD;  Location: [place] [facility];  Service: Cardiovascular;  Laterality: N/A;   TONSILLECTOMY  AS CHILD   TOTAL HIP ARTHROPLASTY Left 05/11/2002   TOTAL HIP ARTHROPLASTY Right 11/15/2012   Procedure: RIGHT TOTAL HIP ARTHROPLASTY ANTERIOR APPROACH;  Surgeon: Marybelle Killings, MD;  Location: [place];  Service: Orthopedics;  Laterality: Right;  Right total  hip arthroplasty   TRANSTHORACIC ECHOCARDIOGRAM  05/08/2010   NORMAL LVSF/  EF 123456  GRADE I DIASTOLIC DYSFUNCTION/  MILD BILATERAL ATRIUM ENLARGEMENT   [place] device  07/07/2022    Social History:  Social History   Socioeconomic History   Marital status: Married    Spouse name: Dawn   Number of children: Not on file   Years of education: Not on file   Highest education level: Not on file  Occupational History   Occupation: retired  Tobacco Use   Smoking status: Light Smoker    Types: Cigars   Smokeless tobacco: Never   Tobacco comments:    AVERAGE 3 CIGARS PER WEEK  Vaping Use   Vaping Use: Never used  Substance and Sexual Activity   Alcohol use: Not Currently    Comment: occasional   Drug use: No   Sexual activity: Not on file  Other Topics Concern   Not on file  Social History Narrative   Not on file   Social Determinants of Health   Financial Resource Strain: Low Risk  (05/08/2022)   Overall Financial Resource Strain (CARDIA)    Difficulty of Paying Living Expenses:  Not hard at all  Food Insecurity: No Food Insecurity (07/22/2022)   Hunger Vital Sign    Worried About Running Out of Food in the Last Year: Never true    Ran Out of Food in the Last Year: Never true  Transportation Needs: No Transportation Needs (07/22/2022)   PRAPARE - Administrator, Civil Service (Medical): No    Lack of Transportation (Non-Medical): No  Physical Activity: Inactive (05/08/2022)   Exercise Vital Sign    Days of Exercise per Week: 0 days    Minutes of Exercise per Session: 0 min  Stress: No Stress Concern Present (05/08/2022)   [employer] of Occupational Health - Occupational Stress Questionnaire    Feeling of Stress : Not at all  Social Connections: Moderately Isolated (05/08/2022)   Social Connection and Isolation Panel [NHANES]    Frequency of Communication with Friends and Family: Three times a week    Frequency of Social Gatherings with Friends and  Family: Three times a week    Attends Religious Services: Never    Active Member of Clubs or Organizations: No    Attends Banker Meetings: Never    Marital Status: Married  Catering manager Violence: Not At Risk (05/22/2022)   Humiliation, Afraid, Rape, and Kick questionnaire    Fear of Current or Ex-Partner: No    Emotionally Abused: No    Physically Abused: No    Sexually Abused: No    Family History:  Family History  Problem Relation Age of Onset   Diabetes Mother    Arthritis Mother    Rheum arthritis Mother    Dementia Mother    Heart disease Father    Prostate cancer Father    Diabetes Sister    Diabetes Maternal Grandmother    Diabetes Maternal Grandfather    Bipolar disorder Brother    Diabetes Brother    Dementia Brother    Neuropathy Brother     Medications:   Current Outpatient Medications on File Prior to Visit  Medication Sig Dispense Refill   acetaminophen (TYLENOL) 500 MG tablet Take 500-1,000 mg by mouth every 6 (six) hours as needed for moderate pain.     allopurinol (ZYLOPRIM) 100 MG tablet Take 2 tablets (200 mg total) by mouth daily. 180 tablet 1   aspirin EC 81 MG tablet Take 1 tablet (81 mg total) by mouth daily. Swallow whole. 90 tablet 3   clopidogrel (PLAVIX) 75 MG tablet Take 1 tablet (75 mg total) by mouth daily. 90 tablet 2   colchicine 0.6 MG tablet Take 0.6 mg by mouth daily as needed (gout).     melatonin 5 MG TABS Take 5 mg by mouth at bedtime as needed (sleep).     metoprolol tartrate (LOPRESSOR) 25 MG tablet TAKE 1 TABLET BY MOUTH TWICE A DAY 180 tablet 0   Multiple Vitamin (MULTIVITAMIN WITH MINERALS) TABS Take 1 tablet by mouth daily.     nitroGLYCERIN (NITROSTAT) 0.4 MG SL tablet Place 1 tablet (0.4 mg total) under the tongue every 5 (five) minutes as needed. 25 tablet 2   Polyethylene Glycol 400 (BLINK TEARS) 0.25 % SOLN Place 1 drop into both eyes 2 (two) times daily as needed (for dryness or irritation).     rosuvastatin  (CRESTOR) 40 MG tablet TAKE 1 TABLET BY MOUTH DAILY 90 tablet 3   sodium chloride (OCEAN) 0.65 % SOLN nasal spray Place 1 spray into both nostrils as needed for congestion.  tadalafil (CIALIS) 5 MG tablet Take 5 mg by mouth daily as needed for erectile dysfunction.     trolamine salicylate (ASPERCREME) 10 % cream Apply 1 Application topically as needed for muscle pain (Heel).     No current facility-administered medications on file prior to visit.    Allergies:   Allergies  Allergen Reactions   Shellfish-Derived Products Other (See Comments)    Gout    Warfarin Sodium Rash      OBJECTIVE:  Physical Exam  There were no vitals filed for this visit.   There is no height or weight on file to calculate BMI. No results found.  General: well developed, well nourished,  very pleasant elderly Caucasian male, seated, in no evident distress Head: head normocephalic and atraumatic.   Neck: supple with no carotid or supraclavicular bruits Cardiovascular: regular rate and rhythm, no murmurs Musculoskeletal: no deformity Skin:  no rash/petichiae Vascular:  Normal pulses all extremities   Neurologic Exam Mental Status: Awake and fully alert.  Very slight dysarthria and mild hypophonia.  Oriented to place and time. Recent and remote memory intact. Attention span, concentration and fund of knowledge appropriate. Mood and affect appropriate.  Cranial Nerves: Pupils equal, briskly reactive to light. Extraocular movements full without nystagmus. Visual fields full to confrontation. Hearing intact. Facial sensation intact.  Left lower facial weakness.  Tongue, and palate moves normally and symmetrically.  Motor: Normal bulk and tone. Normal strength in all tested extremity muscles except slightly decreased left hand grip strength and decreased dexterity Sensory.:  Decreased pinprick sensation left pointer finger and middle finger distally otherwise all other extremities intact to touch ,  pinprick , position and vibratory sensation.  Coordination: Rapid alternating movements normal in all extremities except slightly decreased left hand Dexterity. Finger-to-nose mild dysmetria LUE and heel-to-shin performed accurately bilaterally. Gait and Station: Arises from chair without difficulty. Stance is normal. Gait demonstrates normal stride length and balance without use of assistive device.  Tandem walk and heel toe with mild difficulty Reflexes: 1+ and symmetric. Toes downgoing.        ASSESSMENT: [name] Short is a 76 y.o. 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 Vascular risk factors include PAF s/p Watchman device 06/2022, CAD s/p STEMI with LAD stent 04/2022, HLD, HTN, advanced age, and cigar smoker.     PLAN:  R frontal stroke, embolic:  Residual deficit: Left hand weakness with sensory impairment, mild dysarthria and left facial weakness.  Will reach out to prior OT to see if any benefit with additional therapy more so to learn compensation strategies. As his stroke was almost 1 year ago now, he will likely not see any further recovery  Continue clopidogrel 75 mg daily, aspirin and Crestor for secondary stroke prevention and per cards recommendations  Continue to follow with cardiology for A fib management/monitoring Discussed secondary stroke prevention measures and importance of close PCP follow up for aggressive stroke risk factor management including BP goal<130/90, and HLD with LDL goal<70    Follow up in 1 year or call earlier if needed   CC:  PCP: Midge Minium, MD    I spent 34 minutes of face-to-face and non-face-to-face time with patient.  This included previsit chart review, lab review, study review, order entry, electronic health record documentation, patient education and discussion regarding history  of prior stroke with residual deficits, secondary  stroke prevention measures and aggressive stroke risk factor management, and answered all other questions to patient's satisfaction   Frann Rider, [facility]  [facility] [address] [place], [place] [address]  Phone [phone] Fax [phone] Note: This document was prepared with digital dictation and possible smart phrase technology. Any transcriptional errors that result from this process are unintentional.

## 2022-09-03 ENCOUNTER — Encounter: Payer: Self-pay | Admitting: Adult Health

## 2022-09-03 ENCOUNTER — Ambulatory Visit (INDEPENDENT_AMBULATORY_CARE_PROVIDER_SITE_OTHER): Payer: Medicare Other | Admitting: Adult Health

## 2022-09-03 VITALS — BP 107/67 | HR 66 | Ht 67.0 in | Wt 176.4 lb

## 2022-09-03 DIAGNOSIS — R200 Anesthesia of skin: Secondary | ICD-10-CM

## 2022-09-03 DIAGNOSIS — I639 Cerebral infarction, unspecified: Secondary | ICD-10-CM | POA: Diagnosis not present

## 2022-09-03 DIAGNOSIS — I2111 ST elevation (STEMI) myocardial infarction involving right coronary artery: Secondary | ICD-10-CM | POA: Diagnosis not present

## 2022-09-03 DIAGNOSIS — Z955 Presence of coronary angioplasty implant and graft: Secondary | ICD-10-CM | POA: Diagnosis not present

## 2022-09-03 NOTE — Patient Instructions (Signed)
Continue aspirin 81 mg daily and clopidogrel 75 mg daily  and Crestor  for secondary stroke prevention and per cardiology recommendations  Continue to follow up with PCP regarding cholesterol and blood pressure management  Maintain strict control of hypertension with blood pressure goal below 130/90 and cholesterol with LDL cholesterol (bad cholesterol) goal below 70 mg/dL.   Signs of a Stroke? Follow the BEFAST method:  Balance Watch for a sudden loss of balance, trouble with coordination or vertigo Eyes Is there a sudden loss of vision in one or both eyes? Or double vision?  Face: Ask the person to smile. Does one side of the face droop or is it numb?  Arms: Ask the person to raise both arms. Does one arm drift downward? Is there weakness or numbness of a leg? Speech: Ask the person to repeat a simple phrase. Does the speech sound slurred/strange? Is the person confused ? Time: If you observe any of these signs, call 911.       Thank you for coming to see [place] at [facility]. I hope we have been able to provide you high quality care today.  You may receive a patient satisfaction survey over the next few weeks. We would appreciate your feedback and comments so that we may continue to improve ourselves and the health of our patients.

## 2022-09-04 DIAGNOSIS — I2111 ST elevation (STEMI) myocardial infarction involving right coronary artery: Secondary | ICD-10-CM | POA: Diagnosis not present

## 2022-09-04 DIAGNOSIS — Z955 Presence of coronary angioplasty implant and graft: Secondary | ICD-10-CM | POA: Diagnosis not present

## 2022-09-05 ENCOUNTER — Telehealth: Payer: Self-pay | Admitting: Cardiology

## 2022-09-05 NOTE — Telephone Encounter (Signed)
Left message for patient-will reschedule follow up with Dr. Bjorn Pippin to later date.  Advised to call back to reschedule.

## 2022-09-05 NOTE — Telephone Encounter (Signed)
Pt calling because he has an MRI on 12/03/22 and an appt with Dr. Bjorn Pippin the same day. He wants to know if Dr. Bjorn Pippin will be able to have his results the same day or does he need to r/s his appt till a little further out

## 2022-09-10 ENCOUNTER — Other Ambulatory Visit: Payer: Self-pay

## 2022-09-10 DIAGNOSIS — Z955 Presence of coronary angioplasty implant and graft: Secondary | ICD-10-CM | POA: Diagnosis not present

## 2022-09-10 DIAGNOSIS — I2111 ST elevation (STEMI) myocardial infarction involving right coronary artery: Secondary | ICD-10-CM | POA: Diagnosis not present

## 2022-09-10 MED ORDER — COLCHICINE 0.6 MG PO TABS: 0.6000 mg | ORAL_TABLET | Freq: Every day | ORAL | 1 refills | Status: DC | PRN

## 2022-09-11 DIAGNOSIS — I2111 ST elevation (STEMI) myocardial infarction involving right coronary artery: Secondary | ICD-10-CM | POA: Diagnosis not present

## 2022-09-11 DIAGNOSIS — Z955 Presence of coronary angioplasty implant and graft: Secondary | ICD-10-CM | POA: Diagnosis not present

## 2022-09-15 ENCOUNTER — Telehealth (HOSPITAL_COMMUNITY): Payer: Self-pay | Admitting: *Deleted

## 2022-09-15 DIAGNOSIS — Z955 Presence of coronary angioplasty implant and graft: Secondary | ICD-10-CM | POA: Diagnosis not present

## 2022-09-15 DIAGNOSIS — I2111 ST elevation (STEMI) myocardial infarction involving right coronary artery: Secondary | ICD-10-CM | POA: Diagnosis not present

## 2022-09-15 NOTE — Telephone Encounter (Signed)
Reaching out to patient to offer assistance regarding upcoming cardiac imaging study; pt's wife answered phone and verbalizes understanding of appt date/time, parking situation and where to check in, pre-test NPO status  and verified current allergies; name and call back number provided for further questions should they arise  Larey Brick RN Navigator Cardiac Imaging [facility] [phone] office [phone] cell  Patient wife if aware that patient is to arrive at 11:30am.

## 2022-09-16 ENCOUNTER — Ambulatory Visit (HOSPITAL_COMMUNITY)
Admission: RE | Admit: 2022-09-16 | Discharge: 2022-09-16 | Disposition: A | Discharge status: Home / Self Care | Payer: Medicare Other | Source: Ambulatory Visit | Attending: Cardiology | Admitting: Cardiology

## 2022-09-16 DIAGNOSIS — Z95818 Presence of other cardiac implants and grafts: Secondary | ICD-10-CM | POA: Insufficient documentation

## 2022-09-16 DIAGNOSIS — I480 Paroxysmal atrial fibrillation: Secondary | ICD-10-CM | POA: Diagnosis not present

## 2022-09-16 MED ORDER — IOHEXOL 350 MG/ML SOLN
100.0000 mL | Freq: Once | INTRAVENOUS | Status: AC | PRN
  Administered 2022-09-16: 100 mL via INTRAVENOUS

## 2022-09-17 DIAGNOSIS — I2111 ST elevation (STEMI) myocardial infarction involving right coronary artery: Secondary | ICD-10-CM | POA: Diagnosis not present

## 2022-09-17 DIAGNOSIS — Z955 Presence of coronary angioplasty implant and graft: Secondary | ICD-10-CM | POA: Diagnosis not present

## 2022-09-18 DIAGNOSIS — I2111 ST elevation (STEMI) myocardial infarction involving right coronary artery: Secondary | ICD-10-CM | POA: Diagnosis not present

## 2022-09-18 DIAGNOSIS — Z955 Presence of coronary angioplasty implant and graft: Secondary | ICD-10-CM | POA: Diagnosis not present

## 2022-09-20 IMAGING — MR MR HEAD W/O CM
12 of 13 series · 44 of 48 positions shown · non-contrast
Comparison: Head CT and CTA from yesterday

CLINICAL DATA: Stroke follow-up

EXAM:
MRI HEAD WITHOUT CONTRAST
TECHNIQUE: Multiplanar, multiecho pulse sequences of the brain and surrounding
structures were obtained without intravenous contrast.

[Series 5: DWI · axial · 3.0mm · 0.88mm/px · z∈[-67,+76]mm · 7 of 100 slices shown (1 of 4)]
[im 1/100]
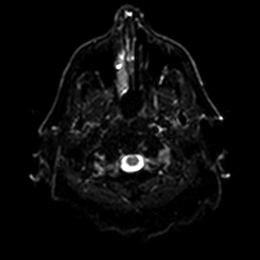
[im 17/100]
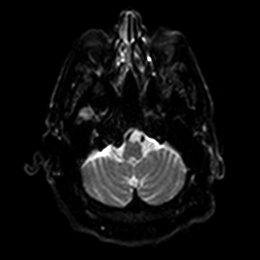
[im 34/100]
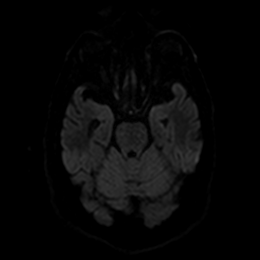
[im 50/100]
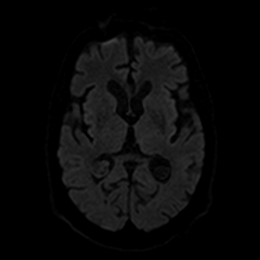
[im 67/100]
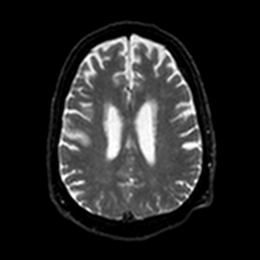
[im 83/100]
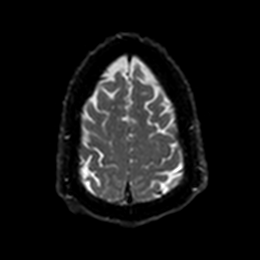
[im 100/100]
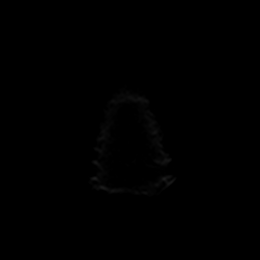

[Series 6: DWI · axial · 3.0mm · 0.88mm/px · z∈[-67,+76]mm · 4 of 50 slices shown (2 of 4)]
[im 1/50]
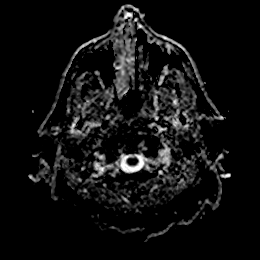
[im 17/50]
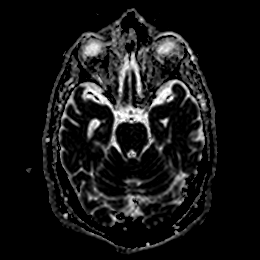
[im 33/50]
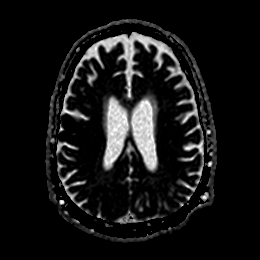
[im 50/50]
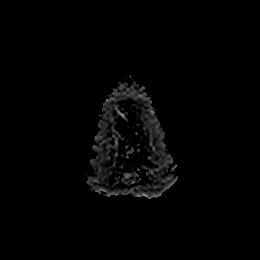

[Series 7: DWI · coronal · 4.0mm · 0.88mm/px · 5 of 70 slices shown (3 of 4)]
[im 1/70]
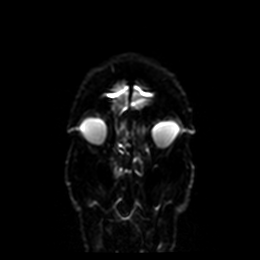
[im 18/70]
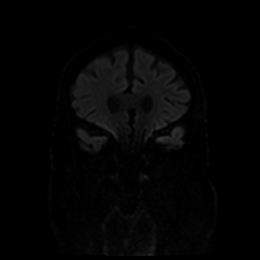
[im 35/70]
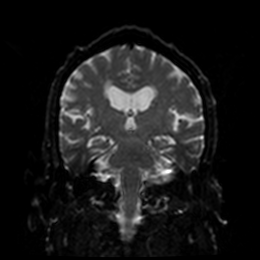
[im 52/70]
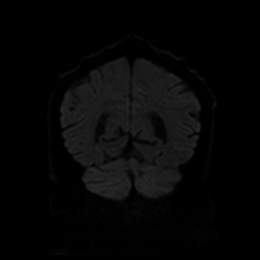
[im 70/70]
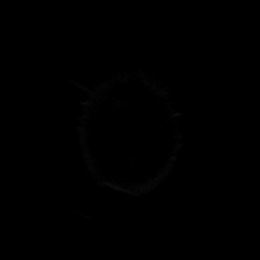

[Series 8: DWI · coronal · 4.0mm · 0.88mm/px · 3 of 35 slices shown (4 of 4)]
[im 1/35]
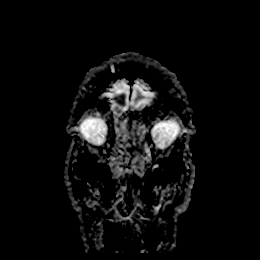
[im 18/35]
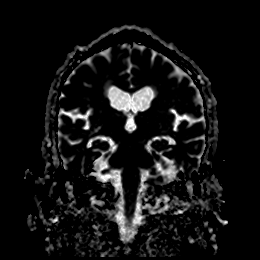
[im 35/35]
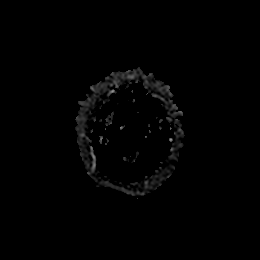

[Series 9: T1 · sagittal · 5.0mm · 0.75mm/px · 2 of 25 slices shown]
[im 1/25]
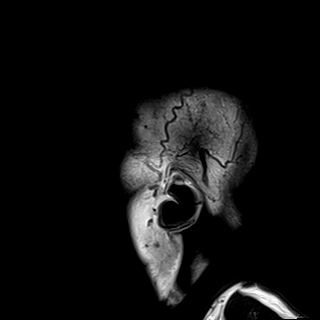
[im 25/25]
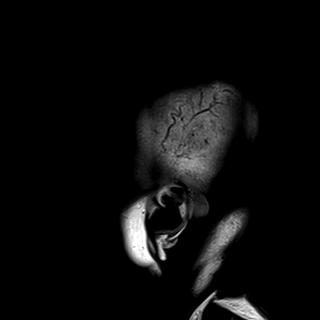

[Series 10: T2 · axial · 5.0mm · 0.72mm/px · z∈[-63,+78]mm · 2 of 25 slices shown (1 of 2)]
[im 1/25]
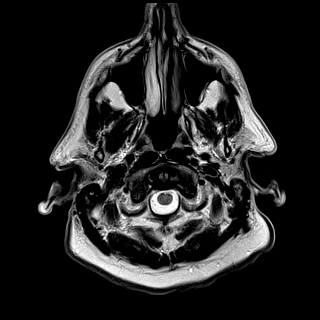
[im 25/25]
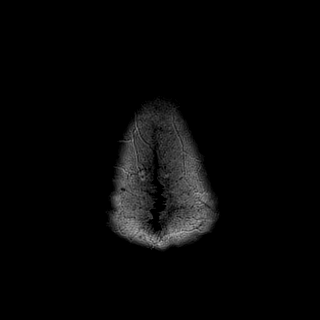

[Series 11: FLAIR · axial · 5.0mm · 0.90mm/px · z∈[-63,+78]mm · 2 of 25 slices shown]
[im 1/25]
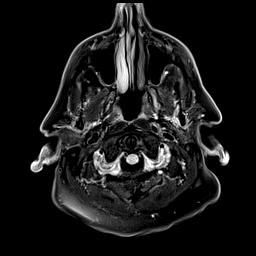
[im 25/25]
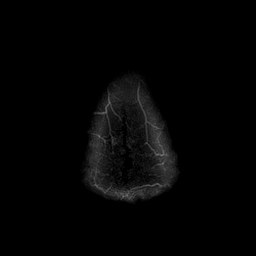

[Series 12: mag_images · axial · 3.0mm · 0.90mm/px · z∈[-67,+82]mm · 4 of 52 slices shown]
[im 1/52]
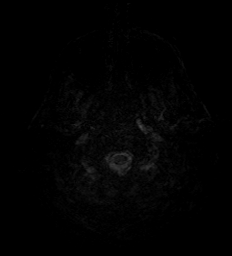
[im 18/52]
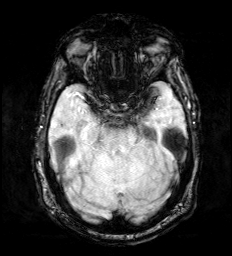
[im 35/52]
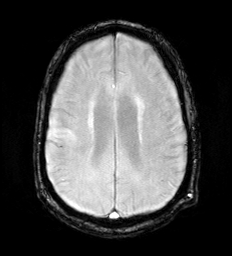
[im 52/52]
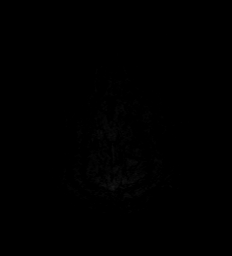

[Series 13: pha_images · axial · 3.0mm · 0.90mm/px · z∈[-64,+82]mm · 4 of 51 slices shown]
[im 1/51]
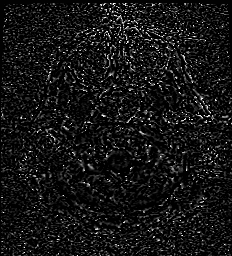
[im 17/51]
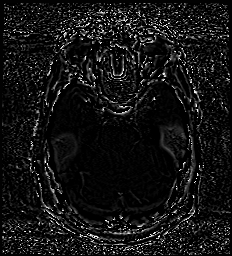
[im 34/51]
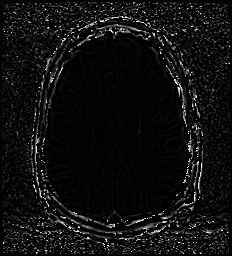
[im 51/51]
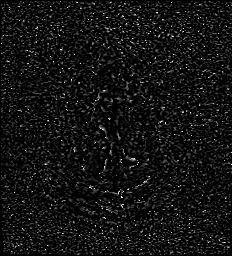

[Series 14: swi_images · axial · 3.0mm · 0.90mm/px · z∈[-67,+82]mm · 4 of 52 slices shown]
[im 1/52]
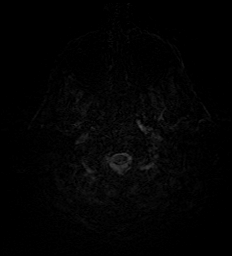
[im 18/52]
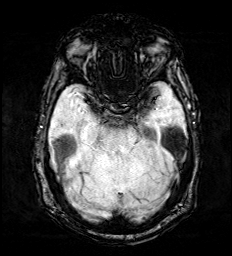
[im 35/52]
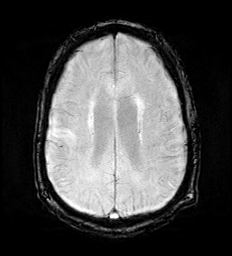
[im 52/52]
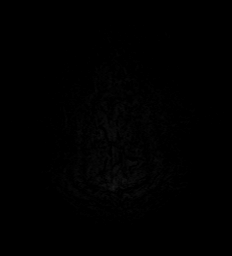

[Series 15: mip_images(sw) · axial · 24.0mm · 0.90mm/px · z∈[-57,+72]mm · 4 of 45 slices shown]
[im 1/45]
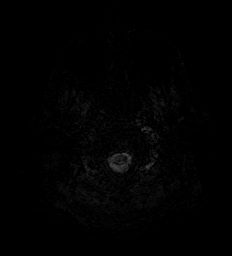
[im 15/45]
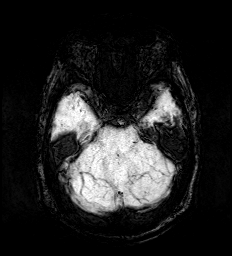
[im 30/45]
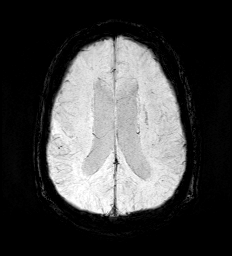
[im 45/45]
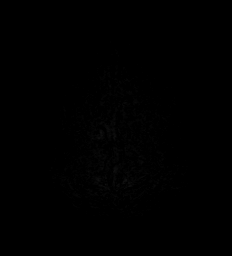

[Series 17: T2 · coronal · 5.0mm · 0.55mm/px · 3 of 32 slices shown (2 of 2)]
[im 1/32]
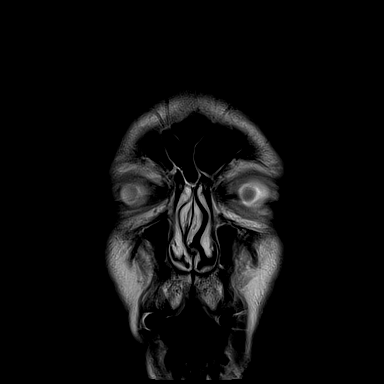
[im 16/32]
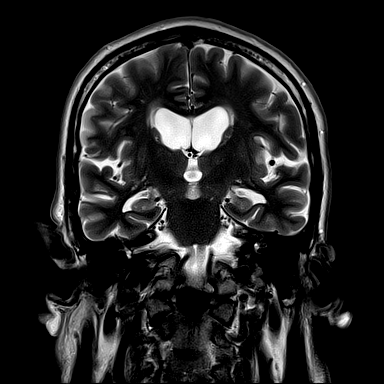
[im 32/32]
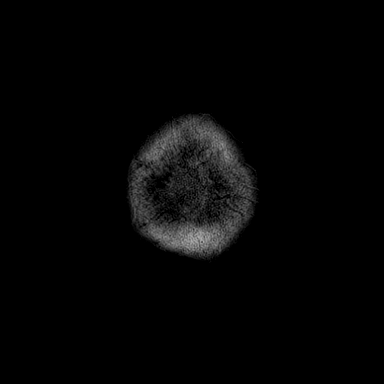

[44 of 48 positions shown; findings below may reference images not displayed]

FINDINGS: Brain: Posterior right frontal cortically based infarct appears
similar in extent to prior CT and unchanged from CT perfusion. No
hemorrhage or other acute infarct. No hydrocephalus, mass, or
collection. Cerebral volume loss without specific pattern. Chronic
small vessel ischemia in the deep white matter is mild. No chronic
blood products.

Vascular: Normal flow voids.

Skull and upper cervical spine: Normal marrow signal

Sinuses/Orbits: Negative
IMPRESSION: Moderate acute infarct in the posterior right frontal lobe. No
progression since NCCT and CTP yesterday.

## 2022-09-25 ENCOUNTER — Other Ambulatory Visit: Payer: Self-pay

## 2022-09-25 DIAGNOSIS — M1A0710 Idiopathic chronic gout, right ankle and foot, without tophus (tophi): Secondary | ICD-10-CM

## 2022-09-25 MED ORDER — COLCHICINE 0.6 MG PO TABS: 0.6000 mg | ORAL_TABLET | Freq: Every day | ORAL | 1 refills | Status: AC | PRN

## 2022-10-01 DIAGNOSIS — Z955 Presence of coronary angioplasty implant and graft: Secondary | ICD-10-CM | POA: Diagnosis not present

## 2022-10-01 DIAGNOSIS — I2111 ST elevation (STEMI) myocardial infarction involving right coronary artery: Secondary | ICD-10-CM | POA: Diagnosis not present

## 2022-10-02 DIAGNOSIS — Z955 Presence of coronary angioplasty implant and graft: Secondary | ICD-10-CM | POA: Diagnosis not present

## 2022-10-02 DIAGNOSIS — I2111 ST elevation (STEMI) myocardial infarction involving right coronary artery: Secondary | ICD-10-CM | POA: Diagnosis not present

## 2022-10-06 DIAGNOSIS — I2111 ST elevation (STEMI) myocardial infarction involving right coronary artery: Secondary | ICD-10-CM | POA: Diagnosis not present

## 2022-10-06 DIAGNOSIS — Z955 Presence of coronary angioplasty implant and graft: Secondary | ICD-10-CM | POA: Diagnosis not present

## 2022-10-08 DIAGNOSIS — Z955 Presence of coronary angioplasty implant and graft: Secondary | ICD-10-CM | POA: Diagnosis not present

## 2022-10-08 DIAGNOSIS — I2111 ST elevation (STEMI) myocardial infarction involving right coronary artery: Secondary | ICD-10-CM | POA: Diagnosis not present

## 2022-10-09 DIAGNOSIS — Z955 Presence of coronary angioplasty implant and graft: Secondary | ICD-10-CM | POA: Diagnosis not present

## 2022-10-09 DIAGNOSIS — I2111 ST elevation (STEMI) myocardial infarction involving right coronary artery: Secondary | ICD-10-CM | POA: Diagnosis not present

## 2022-10-15 DIAGNOSIS — Z955 Presence of coronary angioplasty implant and graft: Secondary | ICD-10-CM | POA: Diagnosis not present

## 2022-10-15 DIAGNOSIS — I2111 ST elevation (STEMI) myocardial infarction involving right coronary artery: Secondary | ICD-10-CM | POA: Diagnosis not present

## 2022-10-16 DIAGNOSIS — I2111 ST elevation (STEMI) myocardial infarction involving right coronary artery: Secondary | ICD-10-CM | POA: Diagnosis not present

## 2022-10-16 DIAGNOSIS — Z955 Presence of coronary angioplasty implant and graft: Secondary | ICD-10-CM | POA: Diagnosis not present

## 2022-10-20 DIAGNOSIS — I2111 ST elevation (STEMI) myocardial infarction involving right coronary artery: Secondary | ICD-10-CM | POA: Diagnosis not present

## 2022-10-20 DIAGNOSIS — Z955 Presence of coronary angioplasty implant and graft: Secondary | ICD-10-CM | POA: Diagnosis not present

## 2022-10-22 ENCOUNTER — Telehealth: Payer: Self-pay | Admitting: Cardiology

## 2022-10-22 DIAGNOSIS — I2111 ST elevation (STEMI) myocardial infarction involving right coronary artery: Secondary | ICD-10-CM | POA: Diagnosis not present

## 2022-10-22 DIAGNOSIS — Z955 Presence of coronary angioplasty implant and graft: Secondary | ICD-10-CM | POA: Diagnosis not present

## 2022-10-22 NOTE — Telephone Encounter (Signed)
Patient states he was previously advised that if he has dental work our office will call in an antibiotic. He states he is having a periodontal cleaning on 1/29 and he would like to have an antibiotic called in. Patient does not know the name of the antibiotic. Please return call to discuss.

## 2022-10-22 NOTE — Telephone Encounter (Signed)
Will route to MD to advise.  Patient had watchman- I did not see any other abx on his file and wanted to check with you.   Thanks!

## 2022-10-23 DIAGNOSIS — I2111 ST elevation (STEMI) myocardial infarction involving right coronary artery: Secondary | ICD-10-CM | POA: Diagnosis not present

## 2022-10-23 DIAGNOSIS — Z955 Presence of coronary angioplasty implant and graft: Secondary | ICD-10-CM | POA: Diagnosis not present

## 2022-10-23 MED ORDER — AMOXICILLIN 500 MG PO TABS: ORAL_TABLET | ORAL | 1 refills | Status: DC

## 2022-10-23 NOTE — Telephone Encounter (Signed)
It is recommended for antibiotic prophylaxis for dental work in first 6 months after watchman placement.  Recommend prescribing amoxicillin 2 g to take prior to his dental appointment

## 2022-10-23 NOTE — Telephone Encounter (Signed)
Pt calling to f/u on Antibiotic for dental appt. Please advise

## 2022-10-23 NOTE — Telephone Encounter (Signed)
Patient advised of Dr. Newman Nickels recommendation: "It is recommended for antibiotic prophylaxis for dental work in first 6 months after watchman placement. Recommend prescribing amoxicillin 2 g to take prior to his dental appointment." Patient stated his only med allergy is warfarin. Explained how to take amoxicillin before dental procedure. Patient verbalized understanding. Order placed.

## 2022-10-27 DIAGNOSIS — I2111 ST elevation (STEMI) myocardial infarction involving right coronary artery: Secondary | ICD-10-CM | POA: Diagnosis not present

## 2022-10-27 DIAGNOSIS — Z955 Presence of coronary angioplasty implant and graft: Secondary | ICD-10-CM | POA: Diagnosis not present

## 2022-10-29 DIAGNOSIS — I2111 ST elevation (STEMI) myocardial infarction involving right coronary artery: Secondary | ICD-10-CM | POA: Diagnosis not present

## 2022-10-29 DIAGNOSIS — Z955 Presence of coronary angioplasty implant and graft: Secondary | ICD-10-CM | POA: Diagnosis not present

## 2022-10-30 DIAGNOSIS — I2111 ST elevation (STEMI) myocardial infarction involving right coronary artery: Secondary | ICD-10-CM | POA: Diagnosis not present

## 2022-10-30 DIAGNOSIS — Z955 Presence of coronary angioplasty implant and graft: Secondary | ICD-10-CM | POA: Diagnosis not present

## 2022-10-31 ENCOUNTER — Encounter: Payer: Self-pay | Admitting: Cardiology

## 2022-10-31 MED ORDER — METOPROLOL TARTRATE 25 MG PO TABS: 25.0000 mg | ORAL_TABLET | Freq: Two times a day (BID) | ORAL | 2 refills | Status: DC

## 2022-11-03 DIAGNOSIS — I2111 ST elevation (STEMI) myocardial infarction involving right coronary artery: Secondary | ICD-10-CM | POA: Diagnosis not present

## 2022-11-03 DIAGNOSIS — Z955 Presence of coronary angioplasty implant and graft: Secondary | ICD-10-CM | POA: Diagnosis not present

## 2022-11-05 DIAGNOSIS — Z955 Presence of coronary angioplasty implant and graft: Secondary | ICD-10-CM | POA: Diagnosis not present

## 2022-11-05 DIAGNOSIS — I2111 ST elevation (STEMI) myocardial infarction involving right coronary artery: Secondary | ICD-10-CM | POA: Diagnosis not present

## 2022-11-06 DIAGNOSIS — I2111 ST elevation (STEMI) myocardial infarction involving right coronary artery: Secondary | ICD-10-CM | POA: Diagnosis not present

## 2022-11-06 DIAGNOSIS — Z955 Presence of coronary angioplasty implant and graft: Secondary | ICD-10-CM | POA: Diagnosis not present

## 2022-11-10 ENCOUNTER — Telehealth: Payer: Self-pay | Admitting: Family Medicine

## 2022-11-10 ENCOUNTER — Telehealth (INDEPENDENT_AMBULATORY_CARE_PROVIDER_SITE_OTHER): Payer: Medicare Other | Admitting: Family Medicine

## 2022-11-10 ENCOUNTER — Encounter: Payer: Self-pay | Admitting: Family Medicine

## 2022-11-10 DIAGNOSIS — U071 COVID-19: Secondary | ICD-10-CM | POA: Diagnosis not present

## 2022-11-10 MED ORDER — NIRMATRELVIR/RITONAVIR (PAXLOVID)TABLET: 3.0000 | ORAL_TABLET | Freq: Two times a day (BID) | ORAL | 0 refills | Status: AC

## 2022-11-10 NOTE — Progress Notes (Signed)
Virtual Visit via Video   I connected with patient on 11/10/22 at  2:40 PM EST by a video enabled telemedicine application and verified that I am speaking with the correct person using two identifiers.  Location patient: Home Location provider: [facility] Office Persons participating in the virtual visit: Patient, Provider, Otoe Marcille Blanco C)  I discussed the limitations of evaluation and management by telemedicine and the availability of in person appointments. The patient expressed understanding and agreed to proceed.  Subjective:   HPI:   COVID- sxs started Saturday afternoon w/ congestion. 'i thought maybe I had a cold coming on'.  Then developed chills and fatigue.  Tm 101.5.  + HA- 'like a sinus HA'.  + body aches.  Intermittent cough.  Has slight SOB when having a coughing fit.  + COVID test.    ROS:   See pertinent positives and negatives per HPI.  Patient Active Problem List   Diagnosis Date Noted   Posterior tibial tendonitis 08/06/2022   Atrial fibrillation ([place]) 07/17/2022   Presence of Watchman left atrial appendage closure device 07/17/2022   Spinal stenosis of lumbar region 06/04/2022   Hematuria 05/16/2022   Coronary artery disease involving native coronary artery of native heart with unstable angina pectoris ([place])    STEMI (ST elevation myocardial infarction) ([place]) 05/01/2022   Acute ST elevation myocardial infarction (STEMI) of inferior wall ([facility])    Recurrent kidney stones 08/14/2021   History of cerebrovascular accident (CVA) with residual deficit 02/11/2021   CVA (cerebral vascular accident) ([place]) 10/21/2020   Bilateral primary osteoarthritis of knee 02/15/2020   Acquired trigger finger of right little finger 11/03/2019   Encounter for orthopedic follow-up care 10/13/2019   H/O total hip arthroplasty, bilateral 10/24/2018   Gout 08/18/2017   Foot pain, left 02/11/2016   Sebaceous cyst 07/14/2014   Sun-damaged skin 07/14/2014   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   Physical exam, annual 05/17/2012   Inguinal hernia 08/12/2010   HYPERLIPIDEMIA TYPE I / IV 05/01/2010   Paroxysmal a-fib ([place]) 05/01/2010   ABNORMAL ELECTROCARDIOGRAM 03/26/2010    Social History   Tobacco Use   Smoking status: Light Smoker    Types: Cigars   Smokeless tobacco: Never   Tobacco comments:    AVERAGE 3 CIGARS PER WEEK  Substance Use Topics   Alcohol use: Not Currently    Comment: occasional    Current Outpatient Medications:    acetaminophen (TYLENOL) 500 MG tablet, Take 500-1,000 mg by mouth every 6 (six) hours as needed for moderate pain., Disp: , Rfl:    allopurinol (ZYLOPRIM) 100 MG tablet, Take 2 tablets (200 mg total) by mouth daily., Disp: 180 tablet, Rfl: 1   amoxicillin (AMOXIL) 500 MG tablet, Take 4 tablets (2 grams) 1 hour before dental procedure., Disp: 4 tablet, Rfl: 1   aspirin EC 81 MG tablet, Take 1 tablet (81 mg total) by mouth daily. Swallow whole., Disp: 90 tablet, Rfl: 3   clopidogrel (PLAVIX) 75 MG tablet, Take 1 tablet (75 mg total) by mouth daily., Disp: 90 tablet, Rfl: 2   colchicine 0.6 MG tablet, Take 1 tablet (0.6 mg total) by mouth daily as needed (gout)., Disp: 90 tablet, Rfl: 1   melatonin 5 MG TABS, Take 5 mg by mouth at bedtime as needed (sleep)., Disp: , Rfl:    metoprolol tartrate (LOPRESSOR) 25 MG tablet, Take 1 tablet (25 mg total) by mouth 2 (two) times daily., Disp: 180 tablet, Rfl: 2   Multiple Vitamin (MULTIVITAMIN  WITH MINERALS) TABS, Take 1 tablet by mouth daily., Disp: , Rfl:    nitroGLYCERIN (NITROSTAT) 0.4 MG SL tablet, Place 1 tablet (0.4 mg total) under the tongue every 5 (five) minutes as needed., Disp: 25 tablet, Rfl: 2   Polyethylene Glycol 400 (BLINK TEARS) 0.25 % SOLN, Place 1 drop into both eyes 2 (two) times daily as needed (for dryness or irritation)., Disp: , Rfl:    rosuvastatin (CRESTOR) 40 MG tablet, TAKE 1 TABLET BY MOUTH DAILY, Disp: 90 tablet, Rfl: 3   sodium chloride (OCEAN) 0.65 % SOLN  nasal spray, Place 1 spray into both nostrils as needed for congestion., Disp: , Rfl:    tadalafil (CIALIS) 5 MG tablet, Take 5 mg by mouth daily as needed for erectile dysfunction., Disp: , Rfl:    trolamine salicylate (ASPERCREME) 10 % cream, Apply 1 Application topically as needed for muscle pain ([place])., Disp: , Rfl:   Allergies  Allergen Reactions   Shellfish-Derived Products Other (See Comments)    Gout    Warfarin Sodium Rash    Objective:   There were no vitals taken for this visit. AAOx3, NAD NCAT, EOMI No obvious CN deficits Coloring WNL Pt is able to speak clearly, coherently without shortness of breath or increased work of breathing.  Thought process is linear.  Mood is appropriate.   Assessment and Plan:   COVID- pt tested + after developing sxs this weekend.  He is interested in starting Paxlovid.  Cautioned that he is not to take his Crestor or Colchicine while on this medication.  Reviewed supportive care and red flags that should prompt return.  Pt expressed understanding and is in agreement w/ plan.    Annye Asa, MD 11/10/2022

## 2022-11-10 NOTE — Telephone Encounter (Signed)
Spoke to the and he states he tested positive for COVID yesterday . He states he was thinking that the pharmacy could send  a request for the  Paxlovid . I told him he needed an apt . He has a virtual apt today at 240 p,m

## 2022-11-10 NOTE — Telephone Encounter (Signed)
Pharmacy is calling in wanting to put pts rosuvastatin on hold for 5 days due the pt testing positive for COVID and they want to give the pt Paxlovid.  When calling the pharmacy back let them know you are calling back about paxlovid pt.

## 2022-11-10 NOTE — Telephone Encounter (Signed)
Spoke to the pharmacist  and explained to her that he has a visit with the pt today and she will decide if he needs the mediation at that time

## 2022-11-10 NOTE — Telephone Encounter (Signed)
Caller name: MOHAMADALI BRANTON  On [place]?: Yes  Call back number:  [phone]  Provider they see: Midge Minium, MD Reason for call:

## 2022-11-20 DIAGNOSIS — Z955 Presence of coronary angioplasty implant and graft: Secondary | ICD-10-CM | POA: Diagnosis not present

## 2022-11-20 DIAGNOSIS — I2111 ST elevation (STEMI) myocardial infarction involving right coronary artery: Secondary | ICD-10-CM | POA: Diagnosis not present

## 2022-11-21 ENCOUNTER — Encounter: Payer: Self-pay | Admitting: Family Medicine

## 2022-11-21 ENCOUNTER — Ambulatory Visit (INDEPENDENT_AMBULATORY_CARE_PROVIDER_SITE_OTHER): Payer: Medicare Other | Admitting: Family Medicine

## 2022-11-21 VITALS — BP 110/70 | HR 67 | Temp 98.7°F | Resp 18 | Ht 67.0 in | Wt 179.0 lb

## 2022-11-21 DIAGNOSIS — I25110 Atherosclerotic heart disease of native coronary artery with unstable angina pectoris: Secondary | ICD-10-CM | POA: Diagnosis not present

## 2022-11-21 DIAGNOSIS — M1000 Idiopathic gout, unspecified site: Secondary | ICD-10-CM | POA: Diagnosis not present

## 2022-11-21 DIAGNOSIS — I4891 Unspecified atrial fibrillation: Secondary | ICD-10-CM

## 2022-11-21 DIAGNOSIS — E783 Hyperchylomicronemia: Secondary | ICD-10-CM

## 2022-11-21 LAB — HEPATIC FUNCTION PANEL
ALT: 18 U/L (ref 0–53)
AST: 23 U/L (ref 0–37)
Albumin: 4.4 g/dL (ref 3.5–5.2)
Alkaline Phosphatase: 80 U/L (ref 39–117)
Bilirubin, Direct: 0.1 mg/dL (ref 0.0–0.3)
Total Bilirubin: 0.5 mg/dL (ref 0.2–1.2)
Total Protein: 7.2 g/dL (ref 6.0–8.3)

## 2022-11-21 LAB — CBC WITH DIFFERENTIAL/PLATELET
Basophils Absolute: 0 10*3/uL (ref 0.0–0.1)
Basophils Relative: 0.6 % (ref 0.0–3.0)
Eosinophils Absolute: 0.2 10*3/uL (ref 0.0–0.7)
Eosinophils Relative: 2.1 % (ref 0.0–5.0)
HCT: 41.1 % (ref 39.0–52.0)
Hemoglobin: 13.4 g/dL (ref 13.0–17.0)
Lymphocytes Relative: 19.7 % (ref 12.0–46.0)
Lymphs Abs: 1.7 10*3/uL (ref 0.7–4.0)
MCHC: 32.7 g/dL (ref 30.0–36.0)
MCV: 90 fl (ref 78.0–100.0)
Monocytes Absolute: 0.5 10*3/uL (ref 0.1–1.0)
Monocytes Relative: 6.2 % (ref 3.0–12.0)
Neutro Abs: 6.2 10*3/uL (ref 1.4–7.7)
Neutrophils Relative %: 71.4 % (ref 43.0–77.0)
Platelets: 273 10*3/uL (ref 150.0–400.0)
RBC: 4.56 Mil/uL (ref 4.22–5.81)
RDW: 14.6 % (ref 11.5–15.5)
WBC: 8.7 10*3/uL (ref 4.0–10.5)

## 2022-11-21 LAB — BASIC METABOLIC PANEL
BUN: 13 mg/dL (ref 6–23)
CO2: 25 mEq/L (ref 19–32)
Calcium: 9.6 mg/dL (ref 8.4–10.5)
Chloride: 105 mEq/L (ref 96–112)
Creatinine, Ser: 0.9 mg/dL (ref 0.40–1.50)
GFR: 83.75 mL/min (ref >60.00)
Glucose, Bld: 88 mg/dL (ref 70–99)
Potassium: 4 mEq/L (ref 3.5–5.1)
Sodium: 141 mEq/L (ref 135–145)

## 2022-11-21 LAB — LIPID PANEL
Cholesterol: 118 mg/dL (ref 0–200)
HDL: 32.6 mg/dL — ABNORMAL LOW (ref >39.00)
LDL Cholesterol: 51 mg/dL (ref 0–99)
NonHDL: 85.45
Total CHOL/HDL Ratio: 4
Triglycerides: 171 mg/dL — ABNORMAL HIGH (ref 0.0–149.0)
VLDL: 34.2 mg/dL (ref 0.0–40.0)

## 2022-11-21 MED ORDER — ALLOPURINOL 300 MG PO TABS: 300.0000 mg | ORAL_TABLET | Freq: Every day | ORAL | 3 refills | Status: DC

## 2022-11-21 NOTE — Assessment & Plan Note (Signed)
Pt continues to have flares despite use of daily allopurinol and colchicine (except when he was on Paxlovid).  Will increase Allopurinol to '300mg'$  daily and monitor for improvement.  If no improvement, will need Rheum referral.  Pt expressed understanding and is in agreement w/ plan.

## 2022-11-21 NOTE — Assessment & Plan Note (Signed)
S/p STEMI and 2 stents.  Now on beta blocker, statin, plavix.  BP is well controlled.  Check lipids to reduce risk as much as possible.  Will continue to follow.

## 2022-11-21 NOTE — Progress Notes (Signed)
   Subjective:    Patient ID: [name] Short, male    DOB: 1946-07-02, 77 y.o.   MRN: [id number]  HPI CAD- chronic problem, on Metoprolol 58m BID and Crestor 462mdaily.  On Plavix 7534maily.  S/p 2 stents.  Has cardiac MRI 3/6.  No current CP.  Denies SOB.  Hyperlipidemia- chronic problem, on Crestor 63m67mily.  No abd pain, N/V.  Gout- chronic problem.  Had to stop Colchicine while he was on Paxlovid and developed a flare in both feet.  Restarted Colchicine and is also on Allopurinol 200mg42mly.   Review of Systems For ROS see HPI     Objective:   Physical Exam Vitals reviewed.  Constitutional:      General: He is not in acute distress.    Appearance: Normal appearance. He is well-developed. He is not ill-appearing.  HENT:     Head: Normocephalic and atraumatic.  Eyes:     Extraocular Movements: Extraocular movements intact.     Conjunctiva/sclera: Conjunctivae normal.     Pupils: Pupils are equal, round, and reactive to light.  Neck:     Thyroid: No thyromegaly.  Cardiovascular:     Rate and Rhythm: Normal rate and regular rhythm.     Pulses: Normal pulses.     Heart sounds: Normal heart sounds. No murmur heard. Pulmonary:     Effort: Pulmonary effort is normal. No respiratory distress.     Breath sounds: Normal breath sounds.  Abdominal:     General: Bowel sounds are normal. There is no distension.     Palpations: Abdomen is soft.  Musculoskeletal:     Cervical back: Normal range of motion and neck supple.     Right lower leg: No edema.     Left lower leg: No edema.  Lymphadenopathy:     Cervical: No cervical adenopathy.  Skin:    General: Skin is warm and dry.  Neurological:     Mental Status: He is alert and oriented to person, place, and time. Mental status is at baseline.     Cranial Nerves: No cranial nerve deficit.  Psychiatric:        Mood and Affect: Mood normal.        Behavior: Behavior normal.           Assessment & Plan:

## 2022-11-21 NOTE — Assessment & Plan Note (Signed)
Chronic problem.  On Crestor 40mg daily w/o difficulty.  Check labs.  Adjust meds prn  ?

## 2022-11-21 NOTE — Patient Instructions (Signed)
Follow up in 6 months to recheck cholesterol We'll notify you of your lab results and make any changes if needed INCREASE the Allopurinol to '300mg'$  daily- new prescription sent! IF the gout does not improve and you continue to have flares, let me know so we can make a rheumatology referral Call with any questions or concerns Stay Safe!  Stay Healthy! Happy Spring!!!

## 2022-11-24 ENCOUNTER — Telehealth: Payer: Self-pay

## 2022-11-24 DIAGNOSIS — I2111 ST elevation (STEMI) myocardial infarction involving right coronary artery: Secondary | ICD-10-CM | POA: Diagnosis not present

## 2022-11-24 DIAGNOSIS — Z955 Presence of coronary angioplasty implant and graft: Secondary | ICD-10-CM | POA: Diagnosis not present

## 2022-11-24 NOTE — Telephone Encounter (Signed)
-----   Message from Midge Minium, MD sent at 11/22/2022  4:44 PM EST ----- Labs look great!  No changes at this time

## 2022-11-24 NOTE — Telephone Encounter (Signed)
Pt seen results Via my chart  

## 2022-11-26 DIAGNOSIS — Z955 Presence of coronary angioplasty implant and graft: Secondary | ICD-10-CM | POA: Diagnosis not present

## 2022-11-26 DIAGNOSIS — I2111 ST elevation (STEMI) myocardial infarction involving right coronary artery: Secondary | ICD-10-CM | POA: Diagnosis not present

## 2022-12-02 ENCOUNTER — Telehealth (HOSPITAL_COMMUNITY): Payer: Self-pay | Admitting: *Deleted

## 2022-12-02 NOTE — Telephone Encounter (Signed)
Attempted to call patient regarding upcoming cardiac MRI appointment. Left message on voicemail with name and callback number  Denzell Colasanti RN Navigator Cardiac Imaging [facility] [phone] Office [phone] Cell  

## 2022-12-02 NOTE — Telephone Encounter (Signed)
Patient returning call about his upcoming cardiac imaging study; pt verbalizes understanding of appt date/time, parking situation and where to check in, and verified current allergies; name and call back number provided for further questions should they arise  Gordy Clement RN Navigator Cardiac Imaging [facility] [phone] office [phone] cell  Patient has had an MRI in the past without incident. Reports a WATCHMAN device and Hip Replacement.

## 2022-12-03 ENCOUNTER — Other Ambulatory Visit: Payer: Self-pay | Admitting: Cardiology

## 2022-12-03 ENCOUNTER — Ambulatory Visit: Payer: Medicare Other | Admitting: Cardiology

## 2022-12-03 ENCOUNTER — Ambulatory Visit (HOSPITAL_COMMUNITY)
Admission: RE | Admit: 2022-12-03 | Discharge: 2022-12-03 | Disposition: A | Discharge status: Home / Self Care | Payer: Medicare Other | Source: Ambulatory Visit | Attending: Cardiology | Admitting: Cardiology

## 2022-12-03 DIAGNOSIS — I517 Cardiomegaly: Secondary | ICD-10-CM | POA: Insufficient documentation

## 2022-12-03 MED ORDER — GADOBUTROL 1 MMOL/ML IV SOLN
8.0000 mL | Freq: Once | INTRAVENOUS | Status: AC | PRN
  Administered 2022-12-03: 8 mL via INTRAVENOUS

## 2022-12-07 NOTE — Progress Notes (Unsigned)
Cardiology Office Note:    Date:  12/10/2022   ID:  [name] Short, DOB [birth year], MRN [id number]  PCP:  Midge Minium, MD  Cardiologist:  Donato Heinz, MD  Electrophysiologist:  Vickie Epley, MD   Referring MD: Midge Minium, MD   Chief Complaint  Patient presents with   Coronary Artery Disease     History of Present Illness:    [name] Short is a 77 y.o. 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  He was initially seen on 11/27/2020.  He has a history of paroxysmal atrial fibrillation but has not been on anticoagulation.  He presented to the ED with slurred speech and left hand weakness on 10/21/20, was found to have acute CVA.  MRI brain showed acute infarct in the posterior right frontal lobe.  He had residual left-sided weakness and dysarthria.  Echocardiogram showed normal LVEF.  He was started on Eliquis 5 mg twice daily, stroke likely secondary to atrial fibrillation.  He had paroxysmal atrial fibrillation while in the hospital, heart rate 90s to 100s while in A. fib.  He was started on Cardizem.  He presented to [facility] with chest pain, found to have inferior STEMI.  Cath showed occluded distal LAD, treated with DES x1.  Also noted to have severe stenosis in mid LAD for which he underwent staged intervention with DES x1.  Plan was for triple therapy for 1 month with aspirin, Plavix, and Eliquis then stop aspirin.  Echocardiogram showed EF 50 to 55%.  Cardiac MRI was done on 12/03/2022 which showed normal LV size, wall thickness, and systolic function (EF 99991111), normal RV size and systolic function (EF 99991111), no LGE.  Zio patch x14 days 01/2021 showed 18% A. fib burden with longest episode lasting 1 day 7 hours, average heart rate in A. fib 80 bpm.  Also showed 5 episodes of SVT, longest lasting 18 seconds.  He had ongoing issues with hematuria and was  referred to Dr. Quentin Ore for [facility] evaluation.  Underwent successful watchman placement 06/2022.  Since last clinic visit, he reports he is doing okay.  Had recent COVID-19 infection but reports is feeling improved.  Denies any chest pain, dyspnea, lightheadedness, syncope, lower extremity edema, or palpitations.   Past Medical History:  Diagnosis Date   Arthritis    WRIST   Benign localized prostatic hyperplasia with lower urinary tract symptoms (LUTS)    Complication of anesthesia    slow to wake   ED (erectile dysfunction)    GERD (gastroesophageal reflux disease)    History of concussion    AS CHILD--  NO RESIDUAL   History of gout    History of kidney stones    Hyperlipidemia    Inguinal hernia, bilateral    Nephrolithiasis    BILATERAL   PAF (paroxysmal atrial fibrillation) ([place]) currently being followed by pcp   EPISODE --  2011  FOLLOW-UP W/ DR WALL  /  HAS NOT SEEN ANY CARDIOLOGIST SINCE   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 LQuentin Ore   Past Surgical History:  Procedure Laterality Date   APPENDECTOMY  1983   CARDIOVASCULAR STRESS TEST  05-08-2010  DR WALL   NO EVIDENCE OF SCAR OR ISCHEMIA/ EF 54%/  PT HAD BOTH AFIB/ AFLUTTER DURING STUDY   CARPAL TUNNEL RELEASE Right 01/ 14/ 2021  CORONARY STENT INTERVENTION N/A 05/02/2022   Procedure: CORONARY STENT INTERVENTION;  Surgeon: Burnell Blanks, MD;  Location: [place] [facility];  Service: Cardiovascular;  Laterality: N/A;   CORONARY/GRAFT ACUTE MI REVASCULARIZATION N/A 05/01/2022   Procedure: Coronary/Graft Acute MI Revascularization;  Surgeon: Burnell Blanks, MD;  Location: [place] [facility];  Service: Cardiovascular;  Laterality: N/A;   CYSTOSCOPY W/ URETERAL STENT PLACEMENT Left 09/20/2013   Procedure: CYSTOSCOPY WITH STENT REPLACEMENT;  Surgeon: Fredricka Bonine, MD;  Location: [facility];  Service: Urology;   Laterality: Left;   CYSTOSCOPY WITH URETEROSCOPY Left 09/20/2013   Procedure: CYSTOSCOPY WITH URETEROSCOPY;  Surgeon: Fredricka Bonine, MD;  Location: [facility];  Service: Urology;  Laterality: Left;   CYSTOSCOPY WITH URETEROSCOPY AND STENT PLACEMENT Left 07/12/2013   Procedure: CYSTOSCOPY WITH LITHOLAPEXY, LEFT URETERAL STENT PLACEMENT;  Surgeon: Fredricka Bonine, MD;  Location[facility];  Service: Urology;  Laterality: Left;   CYSTOSCOPY WITH URETEROSCOPY AND STENT PLACEMENT Bilateral 06/23/2014   Procedure: BILATERAL URETEROSCOPY, HOLMIUM LASER LITHOTRIPSY AND STENT PLACEMENT, RIGHT ;  Surgeon: Festus Aloe, MD;  Location: [facility];  Service: Urology;  Laterality: Bilateral;   EXTRACORPOREAL SHOCK WAVE LITHOTRIPSY  X2   EXTRACORPOREAL SHOCK WAVE LITHOTRIPSY Left 08-29-2013;   07-28-2013   EXTRACORPOREAL SHOCK WAVE LITHOTRIPSY Right 12/09/2018   Procedure: EXTRACORPOREAL SHOCK WAVE LITHOTRIPSY (ESWL);  Surgeon: Festus Aloe, MD;  Location: [facility];  Service: Urology;  Laterality: Right;   HOLMIUM LASER APPLICATION Left [id number]   Procedure: HOLMIUM LASER APPLICATION;  Surgeon: Fredricka Bonine, MD;  Location: [facility];  Service: Urology;  Laterality: Left;   INGUINAL HERNIA REPAIR Bilateral 10/16/2020   Procedure: BILATERAL OPEN INGUINAL HERNIA REPAIR WITH MESH;  Surgeon: Coralie Keens, MD;  Location: [place];  Service: General;  Laterality: Bilateral;  LMA/TAP BLOCK   LAPAROSCOPIC INGUINAL HERNIA REPAIR Bilateral 09/04/2010   w/ mesh   LEFT ATRIAL APPENDAGE OCCLUSION N/A 07/17/2022   Procedure: LEFT ATRIAL APPENDAGE OCCLUSION;  Surgeon: Vickie Epley, MD;  Location: [place] [facility];  Service: Cardiovascular;  Laterality: N/A;   LEFT HEART CATH AND CORONARY ANGIOGRAPHY N/A 05/01/2022   Procedure: LEFT HEART CATH AND CORONARY ANGIOGRAPHY;  Surgeon: Burnell Blanks, MD;  Location: [place] [facility];  Service: Cardiovascular;  Laterality: N/A;   TEE WITHOUT CARDIOVERSION N/A 07/17/2022   Procedure: TRANSESOPHAGEAL ECHOCARDIOGRAM (TEE);  Surgeon: Vickie Epley, MD;  Location: [place] [facility];  Service: Cardiovascular;  Laterality: N/A;   TONSILLECTOMY  AS CHILD   TOTAL HIP ARTHROPLASTY Left 05/11/2002   TOTAL HIP ARTHROPLASTY Right 11/15/2012   Procedure: RIGHT TOTAL HIP ARTHROPLASTY ANTERIOR APPROACH;  Surgeon: Marybelle Killings, MD;  Location: [place];  Service: Orthopedics;  Laterality: Right;  Right total hip arthroplasty   TRANSTHORACIC ECHOCARDIOGRAM  05/08/2010   NORMAL LVSF/  EF 123456  GRADE I DIASTOLIC DYSFUNCTION/  MILD BILATERAL ATRIUM ENLARGEMENT   Wat[place]n device  07/07/2022    Current Medications: Current Meds  Medication Sig   acetaminophen (TYLENOL) 500 MG tablet Take 500-1,000 mg by mouth every 6 (six) hours as needed for moderate pain.   allopurinol (ZYLOPRIM) 300 MG tablet Take 1 tablet (300 mg total) by mouth daily.   aspirin EC 81 MG tablet Take 1 tablet (81 mg total) by mouth daily. Swallow whole.   clopidogrel (PLAVIX) 75 MG tablet Take 1 tablet (75 mg total) by mouth daily.  colchicine 0.6 MG tablet Take 1 tablet (0.6 mg total) by mouth daily as needed (gout).   melatonin 5 MG TABS Take 5 mg by mouth at bedtime as needed (sleep).   metoprolol succinate (TOPROL XL) 25 MG 24 hr tablet Take 1 tablet (25 mg total) by mouth daily.   Multiple Vitamin (MULTIVITAMIN WITH MINERALS) TABS Take 1 tablet by mouth daily.   nitroGLYCERIN (NITROSTAT) 0.4 MG SL tablet Place 1 tablet (0.4 mg total) under the tongue every 5 (five) minutes as needed.   Polyethylene Glycol 400 (BLINK TEARS) 0.25 % SOLN Place 1 drop into both eyes 2 (two) times daily as needed (for dryness or irritation).   rosuvastatin (CRESTOR) 40 MG tablet TAKE 1 TABLET BY MOUTH DAILY   sodium chloride (OCEAN) 0.65 % SOLN nasal spray Place 1 spray into both nostrils  as needed for congestion.   tadalafil (CIALIS) 5 MG tablet Take 5 mg by mouth daily as needed for erectile dysfunction.   trolamine salicylate (ASPERCREME) 10 % cream Apply 1 Application topically as needed for muscle pain ([place]).   [DISCONTINUED] amoxicillin (AMOXIL) 500 MG tablet Take 4 tablets (2 grams) 1 hour before dental procedure.   [DISCONTINUED] metoprolol tartrate (LOPRESSOR) 25 MG tablet Take 1 tablet (25 mg total) by mouth 2 (two) times daily.     Allergies:   Shellfish-derived products and Warfarin sodium   Social History   Socioeconomic History   Marital status: Married    Spouse name: Dawn   Number of children: Not on file   Years of education: Not on file   Highest education level: Not on file  Occupational History   Occupation: retired  Tobacco Use   Smoking status: Former    Types: Cigars   Smokeless tobacco: Never   Tobacco comments:    AVERAGE 3 CIGARS PER WEEK  Vaping Use   Vaping Use: Never used  Substance and Sexual Activity   Alcohol use: Not Currently   Drug use: No   Sexual activity: Not on file  Other Topics Concern   Not on file  Social History Narrative   Not on file   Social Determinants of Health   Financial Resource Strain: Low Risk  (05/08/2022)   Overall Financial Resource Strain (CARDIA)    Difficulty of Paying Living Expenses: Not hard at all  Food Insecurity: No Food Insecurity (07/22/2022)   Hunger Vital Sign    Worried About Running Out of Food in the Last Year: Never true    [place] in the Last Year: Never true  Transportation Needs: No Transportation Needs (07/22/2022)   PRAPARE - Hydrologist (Medical): No    Lack of Transportation (Non-Medical): No  Physical Activity: Inactive (05/08/2022)   Exercise Vital Sign    Days of Exercise per Week: 0 days    Minutes of Exercise per Session: 0 min  Stress: No Stress Concern Present (05/08/2022)   [place]    Feeling of Stress : Not at all  Social Connections: Moderately Isolated (05/08/2022)   Social Connection and Isolation Panel [NHANES]    Frequency of Communication with Friends and Family: Three times a week    Frequency of Social Gatherings with Friends and Family: Three times a week    Attends Religious Services: Never    Active Member of Clubs or Organizations: No    Attends Archivist Meetings: Never    Marital  Status: Married     Family History: The patient's family history includes Arthritis in his mother; Bipolar disorder in his brother; Dementia in his brother and mother; Diabetes in his brother, maternal grandfather, maternal grandmother, mother, and sister; Heart disease in his father; Neuropathy in his brother; Prostate cancer in his father; Rheum arthritis in his mother.  ROS:   Please see the history of present illness.     All other systems reviewed and are negative.  EKGs/Labs/Other Studies Reviewed:    The following studies were reviewed today:  14-Day Monitor 01/03/2021: 18% A. fib burden, longest episode lasting 1 day 7 hours. Average heart rate in A. fib 80 bpm 5 episodes of SVT, longest lasting 18 seconds with average rate 94 bpm   Patch Wear Time:  14 days and 0 hours (2022-03-17T12:53:47-0400 to 2022-03-31T12:53:51-0400)   Patient had a min HR of 41 bpm, max HR of 144 bpm, and avg HR of 61 bpm. Predominant underlying rhythm was Sinus Rhythm. 5 Supraventricular Tachycardia runs occurred, the run with the fastest interval lasting 14 beats with a max rate of 133 bpm, the  longest lasting 17.9 secs with an avg rate of 94 bpm. Atrial Fibrillation occurred (18% burden), ranging from 49-144 bpm (avg of 80 bpm), the longest lasting 1 day 7 hours with an avg rate of 83 bpm. Isolated SVEs were rare (<1.0%), SVE Couplets were  rare (<1.0%), and SVE Triplets were rare (<1.0%). Isolated VEs were rare (<1.0%), VE Couplets were rare  (<1.0%), and no VE Triplets were present.  No patient triggered events.  Echo 10/23/2020: 1. Pt in atrial fibrillation at time of study.   2. Left ventricular ejection fraction, by estimation, is 60 to 65%. The  left ventricle has normal function. The left ventricle has no regional  wall motion abnormalities. There is mild left ventricular hypertrophy.  Left ventricular diastolic function  could not be evaluated.   3. Right ventricular systolic function is normal. The right ventricular  size is normal. Tricuspid regurgitation signal is inadequate for assessing  PA pressure.   4. The mitral valve is normal in structure. Trivial mitral valve  regurgitation. No evidence of mitral stenosis.   5. The aortic valve is tricuspid. Aortic valve regurgitation is trivial.  No aortic stenosis is present.   6. The inferior vena cava is normal in size with greater than 50%  respiratory variability, suggesting right atrial pressure of 3 mmHg.   EKG:    04/04/2022: Normal sinus rhythm, rate 65, no ST abnormality 08/2021: sinus rhythm, rate 54,no ST abnormalities 02/27/2021: normal sinus rhythm, rate 52, Nonspecific T wave flattening 11/27/2020: normal sinus rhythm, rate 65, no ST abnormalities  Recent Labs: 05/01/2022: B Natriuretic Peptide 64.5; TSH 2.377 11/21/2022: ALT 18; BUN 13; Creatinine, Ser 0.90; Hemoglobin 13.4; Platelets 273.0; Potassium 4.0; Sodium 141  Recent Lipid Panel    Component Value Date/Time   CHOL 118 11/21/2022 1134   TRIG 171.0 (H) 11/21/2022 1134   HDL 32.60 (L) 11/21/2022 1134   CHOLHDL 4 11/21/2022 1134   VLDL 34.2 11/21/2022 1134   LDLCALC 51 11/21/2022 1134   LDLDIRECT 128.1 05/17/2012 0901    Physical Exam:    VS:  BP 116/70 (BP Location: Left Arm, Patient Position: Sitting, Cuff Size: Normal)   Pulse (!) 59   Ht '5\' 7"'$  (1.702 m)   Wt 177 lb 9.6 oz (80.6 kg)   SpO2 95%   BMI 27.82 kg/m     Wt Readings from Last 3  Encounters:  12/08/22 177 lb 9.6 oz (80.6 kg)   11/21/22 179 lb (81.2 kg)  09/03/22 176 lb 6.4 oz (80 kg)     GEN:  in no acute distress HEENT: Normal NECK: No JVD; No carotid bruits CARDIAC: RRR, no murmurs, rubs, gallops RESPIRATORY:  Clear to auscultation without rales, wheezing or rhonchi  ABDOMEN: Soft, non-tender, non-distended MUSCULOSKELETAL:  No edema; No deformity  SKIN: Warm and dry NEUROLOGIC:  Alert and oriented x 3 PSYCHIATRIC:  Normal affect   ASSESSMENT:    1. Coronary artery disease involving native coronary artery of native heart without angina pectoris   2. Essential hypertension   3. Hyperlipidemia, unspecified hyperlipidemia type   4. Paroxysmal A-fib (HCC)       PLAN:    CAD: He presented to [facility]/01[facility]22 with chest pain, found to have inferior STEMI.  Cath showed occluded distal LAD, treated with DES x1.  Also noted to have severe stenosis in mid LAD for which he underwent staged intervention with DES x1.  Echocardiogram showed EF 50 to 55%. -Plan was for triple therapy for 1 month with aspirin, Plavix, and Eliquis then stop aspirin.  Subsequently underwent watchman placement 06/2022 so stopped Eliquis after 45 days (08/31/2022).  With stopping Eliquis restarted aspirin and would plan to complete 1 year of DAPT (through 04/2023) -Continue rosuvastatin 40 mg daily -Continue metoprolol, will consolidate to Toprol-XL 25 mg daily  Paroxysmal atrial fibrillation: CHA2DS2-VASc score 4 (age, hypertension, CVA x2).  Echocardiogram 10/23/2020 showed normal biventricular function, no significant valvular disease.  Zio patch x14 days showed 18% A. fib burden with longest episode lasting 1 day 7 hours, average heart rate in A. fib 80 bpm.  Also showed 5 episodes of SVT, longest lasting 18 seconds.  He has had ongoing issues with hematuria due to kidney stones, for which he follows with urology.  Given this, was referred to Dr. Quentin Ore and underwent Watchman placement 06/2022. -Completed 45 days of Eliquis post watchman  placement, have discontinued Eliquis -Continue metoprolol, will consolidate to Toprol-XL 25 mg daily -Sleep study shows mild OSA, no indication for CPAP at this time  LVH: Noted on echocardiogram, also has history of bilateral carpal tunnel syndrome.  Cardiac MRI was done on 12/03/2022 which showed normal LV size, wall thickness, and systolic function (EF 99991111), normal RV size and systolic function (EF 99991111), no LGE; no evidence of cardiac amyloidosis  CVA: Presented 10/21/2020 with left-sided weakness, found to have acute CVA.  Likely due to A. fib as above.   Hyperlipidemia: on rosuvastatin 40 mg daily.  LDL 61 on 11/21/2022  Hypertension: Continue metoprolol, will consolidate to Toprol-XL 25 mg daily.  Appears controlled  Snoring: Sleep study shows mild OSA, no indication for CPAP at this time  Hematuria: Likely due to kidney stones.  Follows with urology.   Given ongoing need for anticoagulation with atrial fibrillation and history of CVA, referred to Dr. Quentin Ore and underwent watchman placement as above   RTC in 5 months    Medication Adjustments/Labs and Tests Ordered: Current medicines are reviewed at length with the patient today.  Concerns regarding medicines are outlined above.  No orders of the defined types were placed in this encounter.   Meds ordered this encounter  Medications   metoprolol succinate (TOPROL XL) 25 MG 24 hr tablet    Sig: Take 1 tablet (25 mg total) by mouth daily.    Dispense:  90 tablet    Refill:  3  Stop lopressor   amoxicillin (AMOXIL) 500 MG tablet    Sig: Take 4 tablets (2 grams) 1 hour before dental procedure.    Dispense:  4 tablet    Refill:  1     Patient Instructions  Medication Instructions:  STOP metoprolol tartrate START metoprolol succinate (Toprol XL) 25 mg daily  *If you need a refill on your cardiac medications before your next appointment, please call your pharmacy*  Follow-Up: At [facility], you and your health  needs are our priority.  As part of our continuing mission to provide you with exceptional heart care, we have created designated Provider Care Teams.  These Care Teams include your primary Cardiologist (physician) and Advanced Practice Providers (APPs -  Physician Assistants and Nurse Practitioners) who all work together to provide you with the care you need, when you need it.  We recommend signing up for the patient portal called "MyChart".  Sign up information is provided on this After Visit Summary.  MyChart is used to connect with patients for Virtual Visits (Telemedicine).  Patients are able to view lab/test results, encounter notes, upcoming appointments, etc.  Non-urgent messages can be sent to your provider as well.   To learn more about what you can do with MyChart, go to [email or web address].    Your next appointment:   5 month(s)  Provider:   Donato Heinz, MD          Signed, Donato Heinz, MD  12/10/2022 2:11 PM    [place]

## 2022-12-08 ENCOUNTER — Encounter: Payer: Self-pay | Admitting: Cardiology

## 2022-12-08 ENCOUNTER — Ambulatory Visit: Payer: Medicare Other | Attending: Cardiology | Admitting: Cardiology

## 2022-12-08 VITALS — BP 116/70 | HR 59 | Ht 67.0 in | Wt 177.6 lb

## 2022-12-08 DIAGNOSIS — I2510 Atherosclerotic heart disease of native coronary artery without angina pectoris: Secondary | ICD-10-CM | POA: Diagnosis not present

## 2022-12-08 DIAGNOSIS — E785 Hyperlipidemia, unspecified: Secondary | ICD-10-CM | POA: Insufficient documentation

## 2022-12-08 DIAGNOSIS — I10 Essential (primary) hypertension: Secondary | ICD-10-CM

## 2022-12-08 DIAGNOSIS — I480 Paroxysmal atrial fibrillation: Secondary | ICD-10-CM | POA: Diagnosis not present

## 2022-12-08 MED ORDER — AMOXICILLIN 500 MG PO TABS: ORAL_TABLET | ORAL | 1 refills | Status: DC

## 2022-12-08 MED ORDER — METOPROLOL SUCCINATE ER 25 MG PO TB24: 25.0000 mg | ORAL_TABLET | Freq: Every day | ORAL | 3 refills | Status: DC

## 2022-12-08 NOTE — Patient Instructions (Signed)
Medication Instructions:  STOP metoprolol tartrate START metoprolol succinate (Toprol XL) 25 mg daily  *If you need a refill on your cardiac medications before your next appointment, please call your pharmacy*  Follow-Up: At [facility], you and your health needs are our priority.  As part of our continuing mission to provide you with exceptional heart care, we have created designated Provider Care Teams.  These Care Teams include your primary Cardiologist (physician) and Advanced Practice Providers (APPs -  Physician Assistants and Nurse Practitioners) who all work together to provide you with the care you need, when you need it.  We recommend signing up for the patient portal called "MyChart".  Sign up information is provided on this After Visit Summary.  MyChart is used to connect with patients for Virtual Visits (Telemedicine).  Patients are able to view lab/test results, encounter notes, upcoming appointments, etc.  Non-urgent messages can be sent to your provider as well.   To learn more about what you can do with MyChart, go to [email or web address].    Your next appointment:   5 month(s)  Provider:   Donato Heinz, MD

## 2023-01-06 ENCOUNTER — Telehealth: Payer: Self-pay

## 2023-01-06 NOTE — Telephone Encounter (Addendum)
See 01/06/23 MyChart encounter.  The patient and his wife called to clarify. The patient states he is doing well 6 months post-LAAO. He saw Dr. Bjorn Pippin recently. Will cancel 6 month Watchman visit 01/14/2023. Informed him and his wife that he will need to CONTINUE PLAVIX at least until he sees Dr. Bjorn Pippin in August. He will call if he needs anything prior to his routine follow-up with Dr. Bjorn Pippin. He was grateful for assistance.

## 2023-01-14 ENCOUNTER — Ambulatory Visit: Payer: Medicare Other

## 2023-01-27 ENCOUNTER — Other Ambulatory Visit: Payer: Self-pay | Admitting: Cardiology

## 2023-02-09 DIAGNOSIS — Z23 Encounter for immunization: Secondary | ICD-10-CM | POA: Diagnosis not present

## 2023-02-11 DIAGNOSIS — N200 Calculus of kidney: Secondary | ICD-10-CM | POA: Diagnosis not present

## 2023-02-11 DIAGNOSIS — N401 Enlarged prostate with lower urinary tract symptoms: Secondary | ICD-10-CM | POA: Diagnosis not present

## 2023-02-11 DIAGNOSIS — R3914 Feeling of incomplete bladder emptying: Secondary | ICD-10-CM | POA: Diagnosis not present

## 2023-02-13 ENCOUNTER — Ambulatory Visit: Payer: Medicare Other | Admitting: Family Medicine

## 2023-04-29 ENCOUNTER — Encounter (INDEPENDENT_AMBULATORY_CARE_PROVIDER_SITE_OTHER): Payer: Self-pay

## 2023-05-14 ENCOUNTER — Ambulatory Visit (INDEPENDENT_AMBULATORY_CARE_PROVIDER_SITE_OTHER): Payer: Medicare Other | Admitting: *Deleted

## 2023-05-14 ENCOUNTER — Other Ambulatory Visit: Payer: Self-pay | Admitting: Cardiology

## 2023-05-14 DIAGNOSIS — Z0000 Encounter for general adult medical examination without abnormal findings: Secondary | ICD-10-CM | POA: Diagnosis not present

## 2023-05-14 NOTE — Progress Notes (Signed)
Subjective:   [name] Short is a 77 y.o. male who presents for Medicare Annual/Subsequent preventive examination.  Visit Complete: Virtual  I connected with  [name] Short on 05/14/23 by a audio enabled telemedicine application and verified that I am speaking with the correct person using two identifiers.  Patient Location: Home  Provider Location: Home Office  I discussed the limitations of evaluation and management by telemedicine. The patient expressed understanding and agreed to proceed.  Patient Medicare AWV questionnaire was completed by the patient on 05-13-2023; I have confirmed that all information answered by patient is correct and no changes since this date.  Vital Signs: Unable to obtain new vitals due to this being a telehealth visit.   Review of Systems     Cardiac Risk Factors include: advanced age (>8men, >5 women);male gender;family history of premature cardiovascular disease     Objective:    There were no vitals filed for this visit. There is no height or weight on file to calculate BMI.     05/14/2023    2:12 PM 07/17/2022    8:32 AM 05/22/2022    3:47 PM 05/08/2022    3:10 PM 05/01/2022    2:00 AM 04/30/2022   11:42 PM 01/05/2021    8:00 PM  Advanced Directives  Does Patient Have a Medical Advance Directive? Yes Yes No  No No No  Type of Estate agent of [employer] Power of [place];Living will       Does patient want to make changes to medical advance directive?  No - Patient declined       Copy of Healthcare Power of Attorney in Chart? No - copy requested No - copy requested       Would patient like information on creating a medical advance directive?   No - Patient declined No - Patient declined No - Patient declined  No - Patient declined    Current Medications (verified) Outpatient Encounter Medications as of 05/14/2023  Medication Sig   acetaminophen (TYLENOL) 500 MG tablet Take 500-1,000 mg by mouth every 6  (six) hours as needed for moderate pain.   allopurinol (ZYLOPRIM) 300 MG tablet Take 1 tablet (300 mg total) by mouth daily.   amoxicillin (AMOXIL) 500 MG tablet Take 4 tablets (2 grams) 1 hour before dental procedure.   aspirin EC 81 MG tablet Take 1 tablet (81 mg total) by mouth daily. Swallow whole.   clopidogrel (PLAVIX) 75 MG tablet TAKE 1 TABLET BY MOUTH DAILY   colchicine 0.6 MG tablet Take 1 tablet (0.6 mg total) by mouth daily as needed (gout).   melatonin 5 MG TABS Take 5 mg by mouth at bedtime as needed (sleep).   metoprolol succinate (TOPROL XL) 25 MG 24 hr tablet Take 1 tablet (25 mg total) by mouth daily.   Multiple Vitamin (MULTIVITAMIN WITH MINERALS) TABS Take 1 tablet by mouth daily.   nitroGLYCERIN (NITROSTAT) 0.4 MG SL tablet Place 1 tablet (0.4 mg total) under the tongue every 5 (five) minutes as needed for chest pain.   Polyethylene Glycol 400 (BLINK TEARS) 0.25 % SOLN Place 1 drop into both eyes 2 (two) times daily as needed (for dryness or irritation).   rosuvastatin (CRESTOR) 40 MG tablet TAKE 1 TABLET BY MOUTH DAILY   sodium chloride (OCEAN) 0.65 % SOLN nasal spray Place 1 spray into both nostrils as needed for congestion.   tadalafil (CIALIS) 5 MG tablet Take 5 mg by mouth daily as needed for  erectile dysfunction.   trolamine salicylate (ASPERCREME) 10 % cream Apply 1 Application topically as needed for muscle pain (Heel).   [DISCONTINUED] nitroGLYCERIN (NITROSTAT) 0.4 MG SL tablet Place 1 tablet (0.4 mg total) under the tongue every 5 (five) minutes as needed.   No facility-administered encounter medications on file as of 05/14/2023.    Allergies (verified) Shellfish-derived products and Warfarin sodium   History: Past Medical History:  Diagnosis Date   Arthritis    WRIST   Benign localized prostatic hyperplasia with lower urinary tract symptoms (LUTS)    Complication of anesthesia    slow to wake   ED (erectile dysfunction)    GERD (gastroesophageal reflux  disease)    History of concussion    AS CHILD--  NO RESIDUAL   History of gout    History of kidney stones    Hyperlipidemia    Inguinal hernia, bilateral    Nephrolithiasis    BILATERAL   PAF (paroxysmal atrial fibrillation) (HCC) currently being followed by pcp   EPISODE --  2011  FOLLOW-UP W/ DR WALL  /  HAS NOT SEEN ANY CARDIOLOGIST SINCE   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 Lalla Brothers   Past Surgical History:  Procedure Laterality Date   APPENDECTOMY  1983   CARDIOVASCULAR STRESS TEST  05-08-2010  DR WALL   NO EVIDENCE OF SCAR OR ISCHEMIA/ EF 54%/  PT HAD BOTH AFIB/ AFLUTTER DURING STUDY   CARPAL TUNNEL RELEASE Right 01/ 14/ 2021   CORONARY STENT INTERVENTION N/A 05/02/2022   Procedure: CORONARY STENT INTERVENTION;  Surgeon: Kathleene Hazel, MD;  Location: [facility];  Service: Cardiovascular;  Laterality: N/A;   CORONARY/GRAFT ACUTE MI REVASCULARIZATION N/A 05/01/2022   Procedure: Coronary/Graft Acute MI Revascularization;  Surgeon: Kathleene Hazel, MD;  Location: [facility];  Service: Cardiovascular;  Laterality: N/A;   CYSTOSCOPY W/ URETERAL STENT PLACEMENT Left 09/20/2013   Procedure: CYSTOSCOPY WITH STENT REPLACEMENT;  Surgeon: Antony Haste, MD;  Location: [facility];  Service: Urology;  Laterality: Left;   CYSTOSCOPY WITH URETEROSCOPY Left 09/20/2013   Procedure: CYSTOSCOPY WITH URETEROSCOPY;  Surgeon: Antony Haste, MD;  [facility];  Service: Urology;  Laterality: Left;   CYSTOSCOPY WITH URETEROSCOPY AND STENT PLACEMENT Left 07/12/2013   Procedure: CYSTOSCOPY WITH LITHOLAPEXY, LEFT URETERAL STENT PLACEMENT;  Surgeon: Antony Haste, MD;  Location: [facility];  Service: Urology;  Laterality: Left;   CYSTOSCOPY WITH URETEROSCOPY AND STENT PLACEMENT Bilateral 06/23/2014   Procedure: BILATERAL  URETEROSCOPY, HOLMIUM LASER LITHOTRIPSY AND STENT PLACEMENT, RIGHT ;  Surgeon: Jerilee Field, MD;  Location: [facility];  Service: Urology;  Laterality: Bilateral;   EXTRACORPOREAL SHOCK WAVE LITHOTRIPSY  X2   EXTRACORPOREAL SHOCK WAVE LITHOTRIPSY Left 08-29-2013;   07-28-2013   EXTRACORPOREAL SHOCK WAVE LITHOTRIPSY Right 12/09/2018   Procedure: EXTRACORPOREAL SHOCK WAVE LITHOTRIPSY (ESWL);  Surgeon: Jerilee Field, MD;  Location: [facility];  Service: Urology;  Laterality: Right;   HOLMIUM LASER APPLICATION Left 09/20/2013   Procedure: HOLMIUM LASER APPLICATION;  Surgeon: Antony Haste, MD;  Location: [facility];  Service: Urology;  Laterality: Left;   INGUINAL HERNIA REPAIR Bilateral 10/16/2020   Procedure: BILATERAL OPEN INGUINAL HERNIA REPAIR WITH MESH;  Surgeon: Abigail Miyamoto, MD;  Location: [facility];  Service: General;  Laterality: Bilateral;  LMA/TAP BLOCK   LAPAROSCOPIC INGUINAL HERNIA REPAIR Bilateral 09/04/2010  w/ mesh   LEFT ATRIAL APPENDAGE OCCLUSION N/A 07/17/2022   Procedure: LEFT ATRIAL APPENDAGE OCCLUSION;  Surgeon: Lanier Prude, MD;  Location: [facility];  Service: Cardiovascular;  Laterality: N/A;   LEFT HEART CATH AND CORONARY ANGIOGRAPHY N/A 05/01/2022   Procedure: LEFT HEART CATH AND CORONARY ANGIOGRAPHY;  Surgeon: Kathleene Hazel, MD;  Location: [facility];  Service: Cardiovascular;  Laterality: N/A;   TEE WITHOUT CARDIOVERSION N/A 07/17/2022   Procedure: TRANSESOPHAGEAL ECHOCARDIOGRAM (TEE);  Surgeon: Lanier Prude, MD;  Location: [facility];  Service: Cardiovascular;  Laterality: N/A;   TONSILLECTOMY  AS CHILD   TOTAL HIP ARTHROPLASTY Left 05/11/2002   TOTAL HIP ARTHROPLASTY Right 11/15/2012   Procedure: RIGHT TOTAL HIP ARTHROPLASTY ANTERIOR APPROACH;  Surgeon: Eldred Manges, MD;  Location: [facility];  Service: Orthopedics;  Laterality: Right;  Right total hip  arthroplasty   TRANSTHORACIC ECHOCARDIOGRAM  05/08/2010   NORMAL LVSF/  EF 55%/  GRADE I DIASTOLIC DYSFUNCTION/  MILD BILATERAL ATRIUM ENLARGEMENT   Watchemn device  07/07/2022   Family History  Problem Relation Age of Onset   Diabetes Mother    Arthritis Mother    Rheum arthritis Mother    Dementia Mother    Heart disease Father    Prostate cancer Father    Diabetes Sister    Diabetes Maternal Grandmother    Diabetes Maternal Grandfather    Bipolar disorder Brother    Diabetes Brother    Dementia Brother    Neuropathy Brother    Social History   Socioeconomic History   Marital status: Married    Spouse name: Dawn   Number of children: Not on file   Years of education: Not on file   Highest education level: Not on file  Occupational History   Occupation: retired  Tobacco Use   Smoking status: Former    Types: Cigars   Smokeless tobacco: Never   Tobacco comments:    AVERAGE 3 CIGARS PER WEEK  Vaping Use   Vaping status: Never Used  Substance and Sexual Activity   Alcohol use: Not Currently   Drug use: No   Sexual activity: Not Currently  Other Topics Concern   Not on file  Social History Narrative   Not on file   Social Determinants of Health   Financial Resource Strain: Low Risk  (05/14/2023)   Overall Financial Resource Strain (CARDIA)    Difficulty of Paying Living Expenses: Not hard at all  Food Insecurity: No Food Insecurity (05/14/2023)   Hunger Vital Sign    Worried About Running Out of Food in the Last Year: Never true    Ran Out of Food in the Last Year: Never true  Transportation Needs: No Transportation Needs (05/14/2023)   PRAPARE - Administrator, Civil Service (Medical): No    Lack of Transportation (Non-Medical): No  Physical Activity: Insufficiently Active (05/14/2023)   Exercise Vital Sign    Days of Exercise per Week: 2 days    Minutes of Exercise per Session: 60 min  Stress: No Stress Concern Present (05/14/2023)   [employer] of [employer]ational [employer] - Occupational Stress Questionnaire    Feeling of Stress : Not at all  Social Connections: Moderately Integrated (05/14/2023)   Social Connection and Isolation Panel [NHANES]    Frequency of Communication with Friends and Family: Three times a week    Frequency of Social Gatherings with Friends and Family: Twice a week  Attends Religious Services: Never    Active Member of Clubs or Organizations: Yes    Attends Engineer, structural: More than 4 times per year    Marital Status: Married    Tobacco Counseling Counseling given: Not Answered Tobacco comments: AVERAGE 3 CIGARS PER WEEK   Clinical Intake:  Pre-visit preparation completed: Yes  Pain : No/denies pain     Diabetes: No  How often do you need to have someone help you when you read instructions, pamphlets, or other written materials from your doctor or pharmacy?: 1 - Never  Interpreter Needed?: No  Information entered by :: Remi Haggard LPN   Activities of Daily Living    05/14/2023    2:07 PM 05/13/2023    2:19 PM  In your present state of health, do you have any difficulty performing the following activities:  Hearing? 0 0  Vision? 0 0  Difficulty concentrating or making decisions? 0 0  Walking or climbing stairs? 0 0  Dressing or bathing? 0 0  Doing errands, shopping? 0 0  Preparing Food and eating ? N N  Using the Toilet? N N  In the past six months, have you accidently leaked urine? N N  Do you have problems with loss of bowel control? N N  Managing your Medications? N N  Managing your Finances? N N  Housekeeping or managing your Housekeeping?  N    Patient Care Team: Sheliah Hatch, MD as PCP - General Little Ishikawa, MD as PCP - Cardiology (Cardiology) Lanier Prude, MD as PCP - Electrophysiology (Cardiology) Jerilee Field, MD as Consulting Physician (Urology) Eldred Manges, MD as Consulting Physician (Orthopedic Surgery) Iva Boop, MD as Consulting Physician (Gastroenterology) Aris Lot, MD as Consulting Physician (Dermatology) Harriette Bouillon, MD as Consulting Physician (General Surgery) Abigail Miyamoto, MD as Consulting Physician (General Surgery) Dahlia Byes, [facility] (Inactive) as Pharmacist (Pharmacist)  Indicate any recent Medical Services you may have received from other than [facility] providers in the past year (date may be approximate).     Assessment:   This is a routine wellness examination for Roby.  Hearing/Vision screen Hearing Screening - Comments:: No trouble hearing [facility]  Dietary issues and exercise activities discussed:     Goals Addressed             This Visit's Progress    Increase physical activity         Depression Screen    05/14/2023    2:09 PM 11/21/2022   11:03 AM 11/10/2022    2:41 PM 08/15/2022    9:11 AM 05/22/2022    3:44 PM 05/16/2022   11:21 AM 05/08/2022    3:12 PM  PHQ 2/9 Scores  PHQ - 2 Score 0 0 0 1 0 0 0  PHQ- 9 Score 2 4 0 2  0     Fall Risk    05/14/2023    2:05 PM 05/13/2023    2:19 PM 11/21/2022   11:03 AM 11/10/2022    2:41 PM 08/15/2022    9:12 AM  Fall Risk   Falls in the past year? 0 0 0 0 0  Number falls in past yr: 0 0 0 0   Injury with Fall? 0 0 0 0   Risk for fall due to :   No Fall Risks No Fall Risks No Fall Risks  Follow up Falls evaluation completed;Education provided;Falls prevention discussed  Falls  evaluation completed Falls evaluation completed Falls evaluation completed    MEDICARE RISK AT HOME:   TIMED UP AND GO:  Was the test performed?  No    Cognitive Function:    07/28/2018   10:27 AM  MMSE - Mini Mental State Exam  Orientation to time 5  Orientation to Place 5  Registration 3  Attention/ Calculation 5  Recall 3  Language- name 2 objects 2  Language- repeat 1  Language- follow 3 step command 3  Language- read & follow direction 1  Write a sentence  1  Copy design 1  Total score 30        05/14/2023    2:10 PM 05/08/2022    3:15 PM  6CIT Screen  What Year? 0 points 0 points  What month? 0 points 0 points  What time? 0 points 0 points  Count back from 20 0 points 0 points  Months in reverse 0 points 0 points  Repeat phrase 0 points 0 points  Total Score 0 points 0 points    Immunizations Immunization History  Administered Date(s) Administered   Fluad Quad(high Dose 65+) 06/25/2019, 08/13/2020   Influenza Split 08/24/2012   Influenza Whole 08/26/2010   Influenza, High Dose Seasonal PF 07/07/2014, 07/23/2017, 07/28/2018   Influenza,inj,Quad PF,6+ Mos 06/08/2013, 07/17/2015, 07/18/2016   Influenza-Unspecified 07/07/2021   Moderna SARS-COV2 Booster Vaccination 08/16/2020   Moderna Sars-Covid-2 Vaccination 10/24/2019, 11/21/2019   PFIZER(Purple Top)SARS-COV-2 Vaccination 03/07/2021   Pfizer Covid-19 Vaccine Bivalent Booster 76yrs & up 07/22/2021   Pneumococcal Conjugate-13 07/14/2014   Pneumococcal Polysaccharide-23 06/08/2013   Tdap 09/16/2016   Typhoid Live 06/11/2017   Zoster, Live 05/17/2012    TDAP status: Up to date  Flu Vaccine status: Up to date  Pneumococcal vaccine status: Up to date  Covid-19 vaccine status: Information provided on how to obtain vaccines.   Qualifies for Shingles Vaccine? Yes   Zostavax completed Yes    Screening Tests Health Maintenance  Topic Date Due   INFLUENZA VACCINE  04/30/2023   Fecal DNA (Cologuard)  05/17/2023 (Originally 11/02/2020)   Medicare Annual Wellness (AWV)  05/13/2024   DTaP/Tdap/Td (2 - Td or Tdap) 09/16/2026   Pneumonia Vaccine 103+ Years old  Completed   Hepatitis C Screening  Completed   HPV VACCINES  Aged Out   COVID-19 Vaccine  Discontinued   Zoster Vaccines- Shingrix  Discontinued    Health Maintenance  Health Maintenance Due  Topic Date Due   INFLUENZA VACCINE  04/30/2023    Colorectal cancer screening: No longer required.   Lung Cancer  Screening: (Low Dose CT Chest recommended if Age 79-80 years, 20 pack-year currently smoking OR have quit w/in 15years.) does not qualify.   Lung Cancer Screening Referral:   Additional Screening:  Hepatitis C Screening: does not qualify; Completed 2017  Vision Screening: Recommended annual ophthalmology exams for early detection of glaucoma and other disorders of the eye. Is the patient up to date with their annual eye exam?  Yes  Who is the provider or what is the name of the office in which the patient attends annual eye exams? [facility] If pt is not established with a provider, would they like to be referred to a provider to establish care? No .   Dental Screening: Recommended annual dental exams for proper oral hygiene    Community Resource Referral / Chronic Care Management: CRR required this visit?  No   CCM required this visit?  No     Plan:  I have personally reviewed and noted the following in the patient's chart:   Medical and social history Use of alcohol, tobacco or illicit drugs  Current medications and supplements including opioid prescriptions. Patient is not currently taking opioid prescriptions. Functional ability and status Nutritional status Physical activity Advanced directives List of other physicians Hospitalizations, surgeries, and ER visits in previous 12 months Vitals Screenings to include cognitive, depression, and falls Referrals and appointments  In addition, I have reviewed and discussed with patient certain preventive protocols, quality metrics, and best practice recommendations. A written personalized care plan for preventive services as well as general preventive health recommendations were provided to patient.     Remi Haggard, LPN   [id number]/61/[id number]   After Visit Summary: (MyChart) Due to this being a telephonic visit, the after visit summary with patients personalized plan was offered to patient via MyChart   Nurse Notes:

## 2023-05-14 NOTE — Patient Instructions (Signed)
[name] Short , Thank you for taking time to come for your Medicare Wellness Visit. I appreciate your ongoing commitment to your health goals. Please review the following plan we discussed and let me know if I can assist you in the future.   Screening recommendations/referrals: Colonoscopy: Education provided Recommended yearly ophthalmology/optometry visit for glaucoma screening and checkup Recommended yearly dental visit for hygiene and checkup  Vaccinations: Influenza vaccine: up to date Pneumococcal vaccine: up to date Tdap vaccine: up to date Shingles vaccine: Education provided    Advanced directives: yes      Preventive Care 65 Years and Older, Male Preventive care refers to lifestyle choices and visits with your health care provider that can promote health and wellness. What does preventive care include? A yearly physical exam. This is also called an annual well check. Dental exams once or twice a year. Routine eye exams. Ask your health care provider how often you should have your eyes checked. Personal lifestyle choices, including: Daily care of your teeth and gums. Regular physical activity. Eating a healthy diet. Avoiding tobacco and drug use. Limiting alcohol use. Practicing safe sex. Taking low doses of aspirin every day. Taking vitamin and mineral supplements as recommended by your health care provider. What happens during an annual well check? The services and screenings done by your health care provider during your annual well check will depend on your age, overall health, lifestyle risk factors, and family history of disease. Counseling  Your health care provider may ask you questions about your: Alcohol use. Tobacco use. Drug use. Emotional well-being. Home and relationship well-being. Sexual activity. Eating habits. History of falls. Memory and ability to understand (cognition). Work and work Astronomer. Screening  You may have the following tests  or measurements: Height, weight, and BMI. Blood pressure. Lipid and cholesterol levels. These may be checked every 5 years, or more frequently if you are over 65 years old. Skin check. Lung cancer screening. You may have this screening every year starting at age 49 if you have a 30-pack-year history of smoking and currently smoke or have quit within the past 15 years. Fecal occult blood test (FOBT) of the stool. You may have this test every year starting at age 56. Flexible sigmoidoscopy or colonoscopy. You may have a sigmoidoscopy every 5 years or a colonoscopy every 10 years starting at age 30. Prostate cancer screening. Recommendations will vary depending on your family history and other risks. Hepatitis C blood test. Hepatitis B blood test. Sexually transmitted disease (STD) testing. Diabetes screening. This is done by checking your blood sugar (glucose) after you have not eaten for a while (fasting). You may have this done every 1-3 years. Abdominal aortic aneurysm (AAA) screening. You may need this if you are a current or former smoker. Osteoporosis. You may be screened starting at age 68 if you are at high risk. Talk with your health care provider about your test results, treatment options, and if necessary, the need for more tests. Vaccines  Your health care provider may recommend certain vaccines, such as: Influenza vaccine. This is recommended every year. Tetanus, diphtheria, and acellular pertussis (Tdap, Td) vaccine. You may need a Td booster every 10 years. Zoster vaccine. You may need this after age 17. Pneumococcal 13-valent conjugate (PCV13) vaccine. One dose is recommended after age 30. Pneumococcal polysaccharide (PPSV23) vaccine. One dose is recommended after age 19. Talk to your health care provider about which screenings and vaccines you need and how often you need them. This  information is not intended to replace advice given to you by your health care provider. Make  sure you discuss any questions you have with your health care provider. Document Released: 10/12/2015 Document Revised: 06/04/2016 Document Reviewed: 07/17/2015 Elsevier Interactive Patient Education  2017 [employer].  Fall Prevention in the Home Falls can cause injuries. They can happen to people of all ages. There are many things you can do to make your home safe and to help prevent falls. What can I do on the outside of my home? Regularly fix the edges of walkways and driveways and fix any cracks. Remove anything that might make you trip as you walk through a door, such as a raised step or threshold. Trim any bushes or trees on the path to your home. Use bright outdoor lighting. Clear any walking paths of anything that might make someone trip, such as rocks or tools. Regularly check to see if handrails are loose or broken. Make sure that both sides of any steps have handrails. Any raised decks and porches should have guardrails on the edges. Have any leaves, snow, or ice cleared regularly. Use sand or salt on walking paths during winter. Clean up any spills in your garage right away. This includes oil or grease spills. What can I do in the bathroom? Use night lights. Install grab bars by the toilet and in the tub and shower. Do not use towel bars as grab bars. Use non-skid mats or decals in the tub or shower. If you need to sit down in the shower, use a plastic, non-slip stool. Keep the floor dry. Clean up any water that spills on the floor as soon as it happens. Remove soap buildup in the tub or shower regularly. Attach bath mats securely with double-sided non-slip rug tape. Do not have throw rugs and other things on the floor that can make you trip. What can I do in the bedroom? Use night lights. Make sure that you have a light by your bed that is easy to reach. Do not use any sheets or blankets that are too big for your bed. They should not hang down onto the floor. Have a firm  chair that has side arms. You can use this for support while you get dressed. Do not have throw rugs and other things on the floor that can make you trip. What can I do in the kitchen? Clean up any spills right away. Avoid walking on wet floors. Keep items that you use a lot in easy-to-reach places. If you need to reach something above you, use a strong step stool that has a grab bar. Keep electrical cords out of the way. Do not use floor polish or wax that makes floors slippery. If you must use wax, use non-skid floor wax. Do not have throw rugs and other things on the floor that can make you trip. What can I do with my stairs? Do not leave any items on the stairs. Make sure that there are handrails on both sides of the stairs and use them. Fix handrails that are broken or loose. Make sure that handrails are as long as the stairways. Check any carpeting to make sure that it is firmly attached to the stairs. Fix any carpet that is loose or worn. Avoid having throw rugs at the top or bottom of the stairs. If you do have throw rugs, attach them to the floor with carpet tape. Make sure that you have a light switch at the top  of the stairs and the bottom of the stairs. If you do not have them, ask someone to add them for you. What else can I do to help prevent falls? Wear shoes that: Do not have high heels. Have rubber bottoms. Are comfortable and fit you well. Are closed at the toe. Do not wear sandals. If you use a stepladder: Make sure that it is fully opened. Do not climb a closed stepladder. Make sure that both sides of the stepladder are locked into place. Ask someone to hold it for you, if possible. Clearly mark and make sure that you can see: Any grab bars or handrails. First and last steps. Where the edge of each step is. Use tools that help you move around (mobility aids) if they are needed. These include: Canes. Walkers. Scooters. Crutches. Turn on the lights when you go  into a dark area. Replace any light bulbs as soon as they burn out. Set up your furniture so you have a clear path. Avoid moving your furniture around. If any of your floors are uneven, fix them. If there are any pets around you, be aware of where they are. Review your medicines with your doctor. Some medicines can make you feel dizzy. This can increase your chance of falling. Ask your doctor what other things that you can do to help prevent falls. This information is not intended to replace advice given to you by your health care provider. Make sure you discuss any questions you have with your health care provider. Document Released: 07/12/2009 Document Revised: 02/21/2016 Document Reviewed: 10/20/2014 Elsevier Interactive Patient Education  2017 [employer].

## 2023-05-22 ENCOUNTER — Encounter: Payer: Self-pay | Admitting: Family Medicine

## 2023-05-22 ENCOUNTER — Ambulatory Visit: Payer: Medicare Other | Admitting: Family Medicine

## 2023-05-22 VITALS — BP 116/60 | HR 65 | Temp 98.1°F | Resp 18 | Ht 67.0 in | Wt 177.2 lb

## 2023-05-22 DIAGNOSIS — Z125 Encounter for screening for malignant neoplasm of prostate: Secondary | ICD-10-CM

## 2023-05-22 DIAGNOSIS — B351 Tinea unguium: Secondary | ICD-10-CM

## 2023-05-22 DIAGNOSIS — I25110 Atherosclerotic heart disease of native coronary artery with unstable angina pectoris: Secondary | ICD-10-CM

## 2023-05-22 DIAGNOSIS — E783 Hyperchylomicronemia: Secondary | ICD-10-CM

## 2023-05-22 LAB — PSA, MEDICARE: PSA: 2.35 ng/ml (ref 0.10–4.00)

## 2023-05-22 MED ORDER — CICLOPIROX 8 % EX SOLN: Freq: Every day | CUTANEOUS | 0 refills | Status: AC

## 2023-05-22 NOTE — Assessment & Plan Note (Signed)
Chronic problem.  Currently on Crestor 40mg daily w/o difficulty.  Check labs.  Adjust meds prn  

## 2023-05-22 NOTE — Progress Notes (Signed)
   Subjective:    Patient ID: [name] Short, male    DOB: 26-Aug-1946, 77 y.o.   MRN: [id number]  HPI Hyperlipidemia- chronic problem, on Crestor 40mg  daily.  No abd pain, N/V.  Continues to do yard work and plays tennis.  CAD- Currently on Metoprolol XL 25mg  daily, ASA, and Plavix.  No recent CP, SOB, palpitations  Toenail fungus- L great toenail.  Has been using OTC medication w/ some relief.   Review of Systems For ROS see HPI     Objective:   Physical Exam Vitals reviewed.  Constitutional:      General: He is not in acute distress.    Appearance: Normal appearance. He is well-developed. He is not ill-appearing.  HENT:     Head: Normocephalic and atraumatic.  Eyes:     Extraocular Movements: Extraocular movements intact.     Conjunctiva/sclera: Conjunctivae normal.     Pupils: Pupils are equal, round, and reactive to light.  Neck:     Thyroid: No thyromegaly.  Cardiovascular:     Rate and Rhythm: Normal rate and regular rhythm.     Pulses: Normal pulses.     Heart sounds: Normal heart sounds. No murmur heard. Pulmonary:     Effort: Pulmonary effort is normal. No respiratory distress.     Breath sounds: Normal breath sounds.  Abdominal:     General: Bowel sounds are normal. There is no distension.     Palpations: Abdomen is soft.  Musculoskeletal:     Cervical back: Normal range of motion and neck supple.     Right lower leg: No edema.     Left lower leg: No edema.  Lymphadenopathy:     Cervical: No cervical adenopathy.  Skin:    General: Skin is warm and dry.     Comments: Yellowing of L great toe nail w/o thickening or splitting  Neurological:     General: No focal deficit present.     Mental Status: He is alert and oriented to person, place, and time.     Cranial Nerves: No cranial nerve deficit.  Psychiatric:        Mood and Affect: Mood normal.        Behavior: Behavior normal.           Assessment & Plan:  Toenail fungus- new.  Pt reports he  has been applying OTC topical products w/ some success but would like it to resolve completely if possible.  Will start Ciclopirox in hopes of avoiding Lamisil as he is already on a statin.  Pt expressed understanding and is in agreement w/ plan.

## 2023-05-22 NOTE — Assessment & Plan Note (Signed)
Currently asymptomatic.  Doing well on beta blocker, statin, ASA, and plavix.  Will continue to follow along and assist as able.

## 2023-05-22 NOTE — Patient Instructions (Signed)
Follow up in 6 months to recheck cholesterol We'll notify you of your lab results and make any changes if needed Apply the Ciclopirox as directed to your toenail Call with any questions or concerns Stay Safe!  Stay Healthy! Happy Labor Day!!

## 2023-05-25 ENCOUNTER — Telehealth: Payer: Self-pay

## 2023-05-25 NOTE — Telephone Encounter (Signed)
-----   Message from Neena Rhymes sent at 05/25/2023  4:00 PM EDT ----- PSA is stable.  No changes at this time

## 2023-05-25 NOTE — Telephone Encounter (Signed)
Left results on vm. 

## 2023-05-28 ENCOUNTER — Ambulatory Visit: Payer: Medicare Other | Admitting: Cardiology

## 2023-06-05 DIAGNOSIS — Z23 Encounter for immunization: Secondary | ICD-10-CM | POA: Diagnosis not present

## 2023-06-30 NOTE — Progress Notes (Unsigned)
Cardiology Office Note:    Date:  07/01/2023   ID:  [name] Short, DOB 1946/09/06, MRN [id number]  PCP:  Sheliah Hatch, MD  Cardiologist:  Little Ishikawa, MD  Electrophysiologist:  Lanier Prude, MD   Referring MD: Sheliah Hatch, MD   Chief Complaint  Patient presents with   Follow-up    5 months.   Coronary Artery Disease     History of Present Illness:    [name] Short is a 77 y.o. 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  He was initially seen on 11/27/2020.  He has a history of paroxysmal atrial fibrillation but has not been on anticoagulation.  He presented to the ED with slurred speech and left hand weakness on 10/21/20, was found to have acute CVA.  MRI brain showed acute infarct in the posterior right frontal lobe.  He had residual left-sided weakness and dysarthria.  Echocardiogram showed normal LVEF.  He was started on Eliquis 5 mg twice daily, stroke likely secondary to atrial fibrillation.  He had paroxysmal atrial fibrillation while in the hospital, heart rate 90s to 100s while in A. fib.  He was started on Cardizem.  He presented to [facility] 05/01/22 with chest pain, found to have inferior STEMI.  Cath showed occluded distal LAD, treated with DES x1.  Also noted to have severe stenosis in mid LAD for which he underwent staged intervention with DES x1.  Plan was for triple therapy for 1 month with aspirin, Plavix, and Eliquis then stop aspirin.  Echocardiogram showed EF 50 to 55%.  Cardiac MRI was done on 12/03/2022 which showed normal LV size, wall thickness, and systolic function (EF 58%), normal RV size and systolic function (EF 58%), no LGE.  Zio patch x14 days 01/2021 showed 18% A. fib burden with longest episode lasting 1 day 7 hours, average heart rate in A. fib 80 bpm.  Also showed 5 episodes of SVT, longest lasting 18 seconds.  He had ongoing issues  with hematuria and was referred to Dr. Lalla Brothers for [facility] evaluation.  Underwent successful watchman placement 06/2022.  Since last clinic visit, he reports he is doing well.  Denies any chest pain, dyspnea, lightheadedness, syncope, lower extremity edema, or palpitations.  Continues to play tennis once per week and do yard work.  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 Lalla Brothers    Past Surgical History:  Procedure Laterality Date   APPENDECTOMY  1983   CARDIOVASCULAR STRESS TEST  05-08-2010  DR WALL   NO EVIDENCE OF SCAR OR ISCHEMIA/ EF 54%/  PT HAD BOTH AFIB/ AFLUTTER DURING STUDY  CARPAL TUNNEL RELEASE Right 01/ 14/ 2021   CORONARY STENT INTERVENTION N/A 05/02/2022   Procedure: CORONARY STENT INTERVENTION;  Surgeon: Kathleene Hazel, MD;  [facility];  Service: Cardiovascular;  Laterality: N/A;   CORONARY/GRAFT ACUTE MI REVASCULARIZATION N/A 05/01/2022   Procedure: Coronary/Graft Acute MI Revascularization;  Surgeon: Kathleene Hazel, MD;  [facility]: [facility];  Service: Cardiovascular;  Laterality: N/A;   CYSTOSCOPY W/ URETERAL STENT PLACEMENT Left 09/20/2013   Procedure: CYSTOSCOPY WITH STENT REPLACEMENT;  Surgeon: Antony Haste, MD;  Location: [facility];  Service:  Urology;  Laterality: Left;   CYSTOSCOPY WITH URETEROSCOPY Left 09/20/2013   Procedure: CYSTOSCOPY WITH URETEROSCOPY;  Surgeon: Antony Haste, MD;  Location: [facility];  Service: Urology;  Laterality: Left;   CYSTOSCOPY WITH URETEROSCOPY AND STENT PLACEMENT Left 07/12/2013   Procedure: CYSTOSCOPY WITH LITHOLAPEXY, LEFT URETERAL STENT PLACEMENT;  Surgeon: Antony Haste, MD;  Location: [facility];  Service: Urology;  Laterality: Left;   CYSTOSCOPY WITH URETEROSCOPY AND STENT PLACEMENT Bilateral 06/23/2014   Procedure: BILATERAL URETEROSCOPY, HOLMIUM LASER LITHOTRIPSY AND STENT PLACEMENT, RIGHT ;  Surgeon: Jerilee Field, MD;  Location: [facility];  Service: Urology;  Laterality: Bilateral;   EXTRACORPOREAL SHOCK WAVE LITHOTRIPSY  X2   EXTRACORPOREAL SHOCK WAVE LITHOTRIPSY Left 08-29-2013;   07-28-2013   EXTRACORPOREAL SHOCK WAVE LITHOTRIPSY Right 12/09/2018   Procedure: EXTRACORPOREAL SHOCK WAVE LITHOTRIPSY (ESWL);  Surgeon: Jerilee Field, MD;  Location: [facility];  Service: Urology;  Laterality: Right;   HOLMIUM LASER APPLICATION Left 09/20/2013   Procedure: HOLMIUM LASER APPLICATION;  Surgeon: Antony Haste, MD;  Location: [facility];  Service: Urology;  Laterality: Left;   INGUINAL HERNIA REPAIR Bilateral 10/16/2020   Procedure: BILATERAL OPEN INGUINAL HERNIA REPAIR WITH MESH;  Surgeon: Abigail Miyamoto, MD;  Location: [facility];  Service: General;  Laterality: Bilateral;  LMA/TAP BLOCK   LAPAROSCOPIC INGUINAL HERNIA REPAIR Bilateral 09/04/2010   w/ mesh   LEFT ATRIAL APPENDAGE OCCLUSION N/A 07/17/2022   Procedure: LEFT ATRIAL APPENDAGE OCCLUSION;  Surgeon: Lanier Prude, MD;  [facility];  Service: Cardiovascular;  Laterality: N/A;   LEFT HEART CATH AND CORONARY ANGIOGRAPHY N/A 05/01/2022   Procedure: LEFT HEART CATH AND CORONARY ANGIOGRAPHY;  Surgeon:  Kathleene Hazel, MD;  [facility]: [facility];  Service: Cardiovascular;  Laterality: N/A;   TEE WITHOUT CARDIOVERSION N/A 07/17/2022   Procedure: TRANSESOPHAGEAL ECHOCARDIOGRAM (TEE);  Surgeon: Lanier Prude, MD;  [facility];  Service: Cardiovascular;  Laterality: N/A;   TONSILLECTOMY  AS CHILD   TOTAL HIP ARTHROPLASTY Left 05/11/2002   TOTAL HIP ARTHROPLASTY Right 11/15/2012   Procedure: RIGHT TOTAL HIP ARTHROPLASTY ANTERIOR APPROACH;  Surgeon: Eldred Manges, MD;  Location: [facility];  Service: Orthopedics;  Laterality: Right;  Right total hip arthroplasty   TRANSTHORACIC ECHOCARDIOGRAM  05/08/2010   NORMAL LVSF/  EF 55%/  GRADE I DIASTOLIC DYSFUNCTION/  MILD BILATERAL ATRIUM ENLARGEMENT   Watchemn device  07/07/2022    Current Medications: Current Meds  Medication Sig   acetaminophen (TYLENOL) 500 MG tablet Take 500-1,000 mg by mouth every 6 (six) hours as needed for moderate pain.   allopurinol (ZYLOPRIM) 300 MG tablet Take 1 tablet (300 mg total) by mouth daily.   aspirin EC 81 MG tablet Take 1 tablet (81 mg total) by mouth daily. Swallow whole.   ciclopirox (PENLAC) 8 % solution Apply topically  at bedtime. Apply over nail and surrounding skin. Apply daily over previous coat. After seven (7) days, may remove with alcohol and continue cycle.   colchicine 0.6 MG tablet Take 1 tablet (0.6 mg total) by mouth daily as needed (gout).   diphenhydrAMINE (ALLERGY RELIEF) 25 mg capsule Take 25 mg by mouth every 6 (six) hours as needed.   melatonin 5 MG TABS Take 5 mg by mouth at bedtime as needed (sleep).   metoprolol succinate (TOPROL XL) 25 MG 24 hr tablet Take 1 tablet (25 mg total) by mouth daily.   Multiple Vitamin (MULTIVITAMIN WITH MINERALS) TABS Take 1 tablet by mouth daily.   nitroGLYCERIN (NITROSTAT) 0.4 MG SL tablet Place 1 tablet (0.4 mg total) under the tongue every 5 (five) minutes as needed for chest pain.   Polyethylene Glycol 400 (BLINK TEARS) 0.25  % SOLN Place 1 drop into both eyes 2 (two) times daily as needed (for dryness or irritation).   rosuvastatin (CRESTOR) 40 MG tablet TAKE 1 TABLET BY MOUTH DAILY   sodium chloride (OCEAN) 0.65 % SOLN nasal spray Place 1 spray into both nostrils as needed for congestion.   tadalafil (CIALIS) 5 MG tablet Take 5 mg by mouth daily as needed for erectile dysfunction.   trolamine salicylate (ASPERCREME) 10 % cream Apply 1 Application topically as needed for muscle pain (Heel).   [DISCONTINUED] amoxicillin (AMOXIL) 500 MG tablet Take 4 tablets (2 grams) 1 hour before dental procedure.   [DISCONTINUED] clopidogrel (PLAVIX) 75 MG tablet TAKE 1 TABLET BY MOUTH DAILY     Allergies:   Shellfish-derived products and Warfarin sodium   Social History   Socioeconomic History   Marital status: Married    Spouse name: Dawn   Number of children: Not on file   Years of education: Not on file   Highest education level: Not on file  Occupational History   Occupation: retired  Tobacco Use   Smoking status: Former    Types: Cigars   Smokeless tobacco: Never   Tobacco comments:    AVERAGE 3 CIGARS PER WEEK  Vaping Use   Vaping status: Never Used  Substance and Sexual Activity   Alcohol use: Not Currently   Drug use: No   Sexual activity: Not Currently  Other Topics Concern   Not on file  Social History Narrative   Not on file   Social Determinants of Health   Financial Resource Strain: Low Risk  (05/14/2023)   Overall Financial Resource Strain (CARDIA)    Difficulty of Paying Living Expenses: Not hard at all  Food Insecurity: No Food Insecurity (05/14/2023)   Hunger Vital Sign    Worried About Running Out of Food in the Last Year: Never true    Ran Out of Food in the Last Year: Never true  Transportation Needs: No Transportation Needs (05/14/2023)   PRAPARE - Administrator, Civil Service (Medical): No    Lack of Transportation (Non-Medical): No  Physical Activity: Insufficiently  Active (05/14/2023)   Exercise Vital Sign    Days of Exercise per Week: 2 days    Minutes of Exercise per Session: 60 min  Stress: No Stress Concern Present (05/14/2023)   [employer] of Occupational Health - Occupational Stress Questionnaire    Feeling of Stress : Not at all  Social Connections: Moderately Integrated (05/14/2023)   Social Connection and Isolation Panel [NHANES]    Frequency of Communication with Friends and Family: Three times a week    Frequency  of Social Gatherings with Friends and Family: Twice a week    Attends Religious Services: Never    Database administrator or Organizations: Yes    Attends Engineer, structural: More than 4 times per year    Marital Status: Married     Family History: The patient's family history includes Arthritis in his mother; Bipolar disorder in his brother; Dementia in his brother and mother; Diabetes in his brother, maternal grandfather, maternal grandmother, mother, and sister; Heart disease in his father; Neuropathy in his brother; Prostate cancer in his father; Rheum arthritis in his mother.  ROS:   Please see the history of present illness.     All other systems reviewed and are negative.  EKGs/Labs/Other Studies Reviewed:    The following studies were reviewed today:  14-Day Monitor 01/03/2021: 18% A. fib burden, longest episode lasting 1 day 7 hours. Average heart rate in A. fib 80 bpm 5 episodes of SVT, longest lasting 18 seconds with average rate 94 bpm   Patch Wear Time:  14 days and 0 hours (2022-03-17T12:53:47-0400 to 2022-03-31T12:53:51-0400)   Patient had a min HR of 41 bpm, max HR of 144 bpm, and avg HR of 61 bpm. Predominant underlying rhythm was Sinus Rhythm. 5 Supraventricular Tachycardia runs occurred, the run with the fastest interval lasting 14 beats with a max rate of 133 bpm, the  longest lasting 17.9 secs with an avg rate of 94 bpm. Atrial Fibrillation occurred (18% burden), ranging from 49-144 bpm  (avg of 80 bpm), the longest lasting 1 day 7 hours with an avg rate of 83 bpm. Isolated SVEs were rare (<1.0%), SVE Couplets were  rare (<1.0%), and SVE Triplets were rare (<1.0%). Isolated VEs were rare (<1.0%), VE Couplets were rare (<1.0%), and no VE Triplets were present.  No patient triggered events.  Echo 10/23/2020: 1. Pt in atrial fibrillation at time of study.   2. Left ventricular ejection fraction, by estimation, is 60 to 65%. The  left ventricle has normal function. The left ventricle has no regional  wall motion abnormalities. There is mild left ventricular hypertrophy.  Left ventricular diastolic function  could not be evaluated.   3. Right ventricular systolic function is normal. The right ventricular  size is normal. Tricuspid regurgitation signal is inadequate for assessing  PA pressure.   4. The mitral valve is normal in structure. Trivial mitral valve  regurgitation. No evidence of mitral stenosis.   5. The aortic valve is tricuspid. Aortic valve regurgitation is trivial.  No aortic stenosis is present.   6. The inferior vena cava is normal in size with greater than 50%  respiratory variability, suggesting right atrial pressure of 3 mmHg.   EKG:    04/04/2022: Normal sinus rhythm, rate 65, no ST abnormality 08/2021: sinus rhythm, rate 54,no ST abnormalities 02/27/2021: normal sinus rhythm, rate 52, Nonspecific T wave flattening 11/27/2020: normal sinus rhythm, rate 65, no ST abnormalities  Recent Labs: 11/21/2022: ALT 18; BUN 13; Creatinine, Ser 0.90; Hemoglobin 13.4; Platelets 273.0; Potassium 4.0; Sodium 141  Recent Lipid Panel    Component Value Date/Time   CHOL 118 11/21/2022 1134   TRIG 171.0 (H) 11/21/2022 1134   HDL 32.60 (L) 11/21/2022 1134   CHOLHDL 4 11/21/2022 1134   VLDL 34.2 11/21/2022 1134   LDLCALC 51 11/21/2022 1134   LDLDIRECT 128.1 05/17/2012 0901    Physical Exam:    VS:  BP 115/64 (BP Location: Left Arm, Patient Position: Sitting, Cuff Size:  Normal)  Pulse 62   Ht 5\' 7"  (1.702 m)   Wt 179 lb (81.2 kg)   BMI 28.04 kg/m     Wt Readings from Last 3 Encounters:  07/01/23 179 lb (81.2 kg)  05/22/23 177 lb 4 oz (80.4 kg)  12/08/22 177 lb 9.6 oz (80.6 kg)     GEN:  in no acute distress HEENT: Normal NECK: No JVD; No carotid bruits CARDIAC: RRR, no murmurs, rubs, gallops RESPIRATORY:  Clear to auscultation without rales, wheezing or rhonchi  ABDOMEN: Soft, non-tender, non-distended MUSCULOSKELETAL:  No edema; No deformity  SKIN: Warm and dry NEUROLOGIC:  Alert and oriented x 3 PSYCHIATRIC:  Normal affect   ASSESSMENT:    1. Coronary artery disease involving native coronary artery of native heart, unspecified whether angina present   2. Paroxysmal a-fib (HCC)   3. Cerebrovascular accident (CVA) due to embolism of right middle cerebral artery (HCC)   4. LVH (left ventricular hypertrophy)   5. Essential hypertension   6. Hyperlipidemia, unspecified hyperlipidemia type      PLAN:    CAD: He presented to [facility] 05/01/22 with chest pain, found to have inferior STEMI.  Cath showed occluded distal LAD, treated with DES x1.  Also noted to have severe stenosis in mid LAD for which he underwent staged intervention with DES x1.  Echocardiogram showed EF 50 to 55%. -Plan was for triple therapy for 1 month with aspirin, Plavix, and Eliquis then stop aspirin.  Subsequently underwent watchman placement 06/2022 so stopped Eliquis after 45 days (08/31/2022).  With stopping Eliquis restarted aspirin to complete 1 year of DAPT (through 04/2023).  Can now stop Plavix and continue aspirin only -Continue rosuvastatin 40 mg daily -Continue Toprol-XL 25 mg daily  Paroxysmal atrial fibrillation: CHA2DS2-VASc score 4 (age, hypertension, CVA x2).  Echocardiogram 10/23/2020 showed normal biventricular function, no significant valvular disease.  Zio patch x14 days showed 18% A. fib burden with longest episode lasting 1 day 7 hours, average heart rate in A.  fib 80 bpm.  Also showed 5 episodes of SVT, longest lasting 18 seconds.  He has had ongoing issues with hematuria due to kidney stones, for which he follows with urology.  Given this, was referred to Dr. Lalla Brothers and underwent Watchman placement 06/2022. -Completed 45 days of Eliquis post watchman placement, have discontinued Eliquis -Continue metoprolol, will consolidate to Toprol-XL 25 mg daily -Sleep study shows mild OSA, no indication for CPAP at this time  LVH: Noted on echocardiogram, also has history of bilateral carpal tunnel syndrome.  Cardiac MRI was done on 12/03/2022 which showed normal LV size, wall thickness, and systolic function (EF 58%), normal RV size and systolic function (EF 58%), no LGE; no evidence of cardiac amyloidosis  CVA: Presented 10/21/2020 with left-sided weakness, found to have acute CVA.  Likely due to A. fib as above.   Hyperlipidemia: on rosuvastatin 40 mg daily.  LDL 51 on 11/21/2022  Hypertension: Continue Toprol-XL 25 mg daily.  Appears controlled  Snoring: Sleep study shows mild OSA, no indication for CPAP at this time  Hematuria: Likely due to kidney stones.  Follows with urology.   Given ongoing need for anticoagulation with atrial fibrillation and history of CVA, referred to Dr. Lalla Brothers and underwent watchman placement as above   RTC in 6 months    Medication Adjustments/Labs and Tests Ordered: Current medicines are reviewed at length with the patient today.  Concerns regarding medicines are outlined above.  Orders Placed This Encounter  Procedures   EKG 12-Lead  Meds ordered this encounter  Medications   amoxicillin (AMOXIL) 500 MG tablet    Sig: Take 4 tablets (2 grams) 1 hour before dental procedure.    Dispense:  4 tablet    Refill:  1     Patient Instructions  Medication Instructions:  Stop Plavix Continue all other medications *If you need a refill on your cardiac medications before your next appointment, please call your  pharmacy*   Lab Work: None ordered   Testing/Procedures: None ordered    Follow-Up: At [facility], you and your health needs are our priority.  As part of our continuing mission to provide you with exceptional heart care, we have created designated Provider Care Teams.  These Care Teams include your primary Cardiologist (physician) and Advanced Practice Providers (APPs -  Physician Assistants and Nurse Practitioners) who all work together to provide you with the care you need, when you need it.  We recommend signing up for the patient portal called "MyChart".  Sign up information is provided on this After Visit Summary.  MyChart is used to connect with patients for Virtual Visits (Telemedicine).  Patients are able to view lab/test results, encounter notes, upcoming appointments, etc.  Non-urgent messages can be sent to your provider as well.   To learn more about what you can do with MyChart, go to [email or web address].    Your next appointment:  6 months   Call in Jan to schedule April appointment     Provider:  Dr.Melenda Bielak       Signed, Little Ishikawa, MD  07/01/2023 1:42 PM    [facility]

## 2023-07-01 ENCOUNTER — Ambulatory Visit: Payer: Medicare Other | Attending: Cardiology | Admitting: Cardiology

## 2023-07-01 ENCOUNTER — Encounter: Payer: Self-pay | Admitting: Cardiology

## 2023-07-01 VITALS — BP 115/64 | HR 62 | Ht 67.0 in | Wt 179.0 lb

## 2023-07-01 DIAGNOSIS — I63411 Cerebral infarction due to embolism of right middle cerebral artery: Secondary | ICD-10-CM | POA: Insufficient documentation

## 2023-07-01 DIAGNOSIS — I480 Paroxysmal atrial fibrillation: Secondary | ICD-10-CM | POA: Insufficient documentation

## 2023-07-01 DIAGNOSIS — I2510 Atherosclerotic heart disease of native coronary artery without angina pectoris: Secondary | ICD-10-CM | POA: Insufficient documentation

## 2023-07-01 DIAGNOSIS — I10 Essential (primary) hypertension: Secondary | ICD-10-CM | POA: Diagnosis not present

## 2023-07-01 DIAGNOSIS — E785 Hyperlipidemia, unspecified: Secondary | ICD-10-CM | POA: Insufficient documentation

## 2023-07-01 DIAGNOSIS — I517 Cardiomegaly: Secondary | ICD-10-CM | POA: Insufficient documentation

## 2023-07-01 MED ORDER — AMOXICILLIN 500 MG PO TABS: ORAL_TABLET | ORAL | 1 refills | Status: DC

## 2023-07-01 NOTE — Patient Instructions (Addendum)
Medication Instructions:  Stop Plavix Continue all other medications *If you need a refill on your cardiac medications before your next appointment, please call your pharmacy*   Lab Work: None ordered   Testing/Procedures: None ordered    Follow-Up: At [facility], you and your health needs are our priority.  As part of our continuing mission to provide you with exceptional heart care, we have created designated Provider Care Teams.  These Care Teams include your primary Cardiologist (physician) and Advanced Practice Providers (APPs -  Physician Assistants and Nurse Practitioners) who all work together to provide you with the care you need, when you need it.  We recommend signing up for the patient portal called "MyChart".  Sign up information is provided on this After Visit Summary.  MyChart is used to connect with patients for Virtual Visits (Telemedicine).  Patients are able to view lab/test results, encounter notes, upcoming appointments, etc.  Non-urgent messages can be sent to your provider as well.   To learn more about what you can do with MyChart, go to [email or web address].    Your next appointment:  6 months   Call in Jan to schedule April appointment     Provider:  Dr.Schumann

## 2023-07-02 DIAGNOSIS — Z23 Encounter for immunization: Secondary | ICD-10-CM | POA: Diagnosis not present

## 2023-08-25 ENCOUNTER — Other Ambulatory Visit: Payer: Self-pay

## 2023-08-25 MED ORDER — ROSUVASTATIN CALCIUM 40 MG PO TABS: 40.0000 mg | ORAL_TABLET | Freq: Every day | ORAL | 3 refills | Status: DC

## 2023-11-19 ENCOUNTER — Other Ambulatory Visit: Payer: Self-pay | Admitting: Cardiology

## 2023-11-24 ENCOUNTER — Encounter: Payer: Self-pay | Admitting: Family Medicine

## 2023-11-24 ENCOUNTER — Other Ambulatory Visit: Payer: Self-pay | Admitting: Family Medicine

## 2023-11-24 ENCOUNTER — Ambulatory Visit (INDEPENDENT_AMBULATORY_CARE_PROVIDER_SITE_OTHER): Payer: Medicare Other | Admitting: Family Medicine

## 2023-11-24 VITALS — BP 112/64 | HR 77 | Temp 98.2°F | Ht 67.0 in | Wt 184.0 lb

## 2023-11-24 DIAGNOSIS — E783 Hyperchylomicronemia: Secondary | ICD-10-CM | POA: Diagnosis not present

## 2023-11-24 DIAGNOSIS — M79642 Pain in left hand: Secondary | ICD-10-CM | POA: Diagnosis not present

## 2023-11-24 DIAGNOSIS — M1000 Idiopathic gout, unspecified site: Secondary | ICD-10-CM

## 2023-11-24 DIAGNOSIS — E663 Overweight: Secondary | ICD-10-CM

## 2023-11-24 DIAGNOSIS — Z1211 Encounter for screening for malignant neoplasm of colon: Secondary | ICD-10-CM

## 2023-11-24 DIAGNOSIS — Z125 Encounter for screening for malignant neoplasm of prostate: Secondary | ICD-10-CM

## 2023-11-24 LAB — CBC WITH DIFFERENTIAL/PLATELET
Basophils Absolute: 0 10*3/uL (ref 0.0–0.1)
Basophils Relative: 0.4 % (ref 0.0–3.0)
Eosinophils Absolute: 0.1 10*3/uL (ref 0.0–0.7)
Eosinophils Relative: 1.7 % (ref 0.0–5.0)
HCT: 47.2 % (ref 39.0–52.0)
Hemoglobin: 15.4 g/dL (ref 13.0–17.0)
Lymphocytes Relative: 21.6 % (ref 12.0–46.0)
Lymphs Abs: 1.9 10*3/uL (ref 0.7–4.0)
MCHC: 32.6 g/dL (ref 30.0–36.0)
MCV: 93.2 fl (ref 78.0–100.0)
Monocytes Absolute: 0.8 10*3/uL (ref 0.1–1.0)
Monocytes Relative: 8.5 % (ref 3.0–12.0)
Neutro Abs: 6.1 10*3/uL (ref 1.4–7.7)
Neutrophils Relative %: 67.8 % (ref 43.0–77.0)
Platelets: 170 10*3/uL (ref 150.0–400.0)
RBC: 5.06 Mil/uL (ref 4.22–5.81)
RDW: 13.8 % (ref 11.5–15.5)
WBC: 8.9 10*3/uL (ref 4.0–10.5)

## 2023-11-24 LAB — HEPATIC FUNCTION PANEL
ALT: 24 U/L (ref 0–53)
AST: 28 U/L (ref 0–37)
Albumin: 4.4 g/dL (ref 3.5–5.2)
Alkaline Phosphatase: 69 U/L (ref 39–117)
Bilirubin, Direct: 0.2 mg/dL (ref 0.0–0.3)
Total Bilirubin: 0.7 mg/dL (ref 0.2–1.2)
Total Protein: 7 g/dL (ref 6.0–8.3)

## 2023-11-24 LAB — BASIC METABOLIC PANEL
BUN: 23 mg/dL (ref 6–23)
CO2: 27 meq/L (ref 19–32)
Calcium: 9.4 mg/dL (ref 8.4–10.5)
Chloride: 103 meq/L (ref 96–112)
Creatinine, Ser: 1.15 mg/dL (ref 0.40–1.50)
GFR: 61.97 mL/min (ref >60.00)
Glucose, Bld: 105 mg/dL — ABNORMAL HIGH (ref 70–99)
Potassium: 4.3 meq/L (ref 3.5–5.1)
Sodium: 141 meq/L (ref 135–145)

## 2023-11-24 LAB — LIPID PANEL
Cholesterol: 91 mg/dL (ref 0–200)
HDL: 40.3 mg/dL (ref >39.00)
LDL Cholesterol: 26 mg/dL (ref 0–99)
NonHDL: 50.25
Total CHOL/HDL Ratio: 2
Triglycerides: 120 mg/dL (ref 0.0–149.0)
VLDL: 24 mg/dL (ref 0.0–40.0)

## 2023-11-24 LAB — PSA, MEDICARE: PSA: 2.11 ng/mL (ref 0.10–4.00)

## 2023-11-24 NOTE — Patient Instructions (Signed)
 Follow up in 6 months to recheck cholesterol We'll notify you of your lab results and make any changes if needed We will call you with your hand referral Keep up the good work on healthy diet and regular exercise- you're doing great! Call with any questions or concerns Stay Safe!  Stay Healthy! Happy Spring!

## 2023-11-24 NOTE — Telephone Encounter (Unsigned)
 Copied from CRM [id number]. Topic: Clinical - Medication Refill >> Nov 24, 2023  6:00 PM Tiffany H wrote: Most Recent Primary Care Visit:  Provider: Sheliah Hatch  Department: [facility]  Visit Type: OFFICE VISIT  Date: 11/24/2023  Medication: allopurinol (ZYLOPRIM) 300 MG tablet [[id number]]  Has the patient contacted their pharmacy? Yes (Agent: If no, request that the patient contact the pharmacy for the refill. If patient does not wish to contact the pharmacy document the reason why and proceed with request.) (Agent: If yes, when and what did the pharmacy advise?)  Is this the correct pharmacy for this prescription? Yes If no, delete pharmacy and type the correct one.  This is the patient's preferred pharmacy:  [facility] [id number] [facility], [place] - [address] [place] [address] Phone: [phone] Fax: [phone]  Has the prescription been filled recently? Yes  Is the patient out of the medication? No  Has the patient been seen for an appointment in the last year OR does the patient have an upcoming appointment? Yes.   Can we respond through MyChart? Yes  Agent: Please be advised that Rx refills may take up to 3 business days. We ask that you follow-up with your pharmacy. >> Nov 24, 2023  6:03 PM Tiffany H wrote: [name] Short. They did not receive prescription sent on 11/22/23. Please resend ASAP. Call dropped. Please confirm with patient when medication has been sent to pharmacy.

## 2023-11-24 NOTE — Progress Notes (Unsigned)
   Subjective:    Patient ID: [name] Short, male    DOB: March 25, 1946, 78 y.o.   MRN: [id number]  HPI Hyperlipidemia- chronic problem, on Crestor 40mg  daily.  Last LDL 51.  No CP, SOB, HA's, abd pain, N/V.  Overweight- pt has gained 5 lbs since last visit.  BMI now 28.82  Walking when weather is nice and playing tennis (will resume this spring)  Gout- pt reports no recent flares since increasing Allopurinol to 300mg  daily.  L hand problem- pt reports thumb and 1st 2 fingers are not responding the way his 4th and 5th fingers are.  He is not sure if this is due to the stroke or that he needs a carpal tunnel release like he had previously w/  Dr Butler Denmark.  He reports sxs are very similar.   Review of Systems For ROS see HPI     Objective:   Physical Exam Vitals reviewed.  Constitutional:      General: He is not in acute distress.    Appearance: Normal appearance. He is well-developed. He is not ill-appearing.  HENT:     Head: Normocephalic and atraumatic.  Eyes:     Extraocular Movements: Extraocular movements intact.     Conjunctiva/sclera: Conjunctivae normal.     Pupils: Pupils are equal, round, and reactive to light.  Neck:     Thyroid: No thyromegaly.  Cardiovascular:     Rate and Rhythm: Normal rate and regular rhythm.     Pulses: Normal pulses.     Heart sounds: Normal heart sounds. No murmur heard. Pulmonary:     Effort: Pulmonary effort is normal. No respiratory distress.     Breath sounds: Normal breath sounds.  Abdominal:     General: Bowel sounds are normal. There is no distension.     Palpations: Abdomen is soft.  Musculoskeletal:     Cervical back: Normal range of motion and neck supple.     Right lower leg: No edema.     Left lower leg: No edema.  Lymphadenopathy:     Cervical: No cervical adenopathy.  Skin:    General: Skin is warm and dry.  Neurological:     Mental Status: He is alert and oriented to person, place, and time. Mental status is at  baseline.     Comments: Slowed speech since stroke  Psychiatric:        Mood and Affect: Mood normal.        Behavior: Behavior normal.           Assessment & Plan:   L hand pain- new.  Pt reports that since his CVA he has had issues w/ hand tightness and fingers not responding as he wants them to.  He is aware that this may be stroke related but he reports R hand felt very similar prior to having a carpal tunnel release w/ Dr Butler Denmark.  He would like a consultation to see if something can be done.  Referral placed.

## 2023-11-25 ENCOUNTER — Telehealth: Payer: Self-pay | Admitting: Family Medicine

## 2023-11-25 ENCOUNTER — Encounter: Payer: Self-pay | Admitting: Family Medicine

## 2023-11-25 MED ORDER — ALLOPURINOL 300 MG PO TABS: 300.0000 mg | ORAL_TABLET | Freq: Every day | ORAL | 3 refills | Status: AC

## 2023-11-25 NOTE — Assessment & Plan Note (Signed)
 Much improved w/ increased dose of Allopurinol.  Continue 300mg  daily.

## 2023-11-25 NOTE — Telephone Encounter (Signed)
 Prescription sent in has not been filled in one year, patient has now been called and informed was appreciative

## 2023-11-25 NOTE — Telephone Encounter (Signed)
 [facility] Night - C Client Site [facility] - Night Provider Lezlie Octave- MD Contact Type Call Who Is Calling Patient / Member / Family / Caregiver Caller Name Declined to provide Caller Phone Number [phone] Patient Name [name] Short Patient DOB [birth year] Call Type Message Only Information Provided Reason for Call Request to Speak to a Physician Initial Comment Caller states his pharmacy has not received his prescription, Allopurinol 300mg  90pills 3 month supply, that he has been requesting for the past few days. Additional Comment Provided office hours, declined triage. Disp. Time Disposition Final User 11/24/2023 6:11:48 PM General Information Provided Yes Shelah Lewandowsky Call Closed By: Shelah Lewandowsky Transaction Date/Time: 11/24/2023 6:05:34 PM (ET)

## 2023-11-25 NOTE — Assessment & Plan Note (Signed)
Chronic problem.  On Crestor 40mg daily w/o difficulty.  Check labs.  Adjust meds prn  

## 2023-11-25 NOTE — Assessment & Plan Note (Signed)
 Deteriorated.  Pt has gained 5 lbs since last visit.  BMI now 28.82.  He plans to be more active as the weather improves.  Will continue to follow.

## 2023-12-25 DIAGNOSIS — Z23 Encounter for immunization: Secondary | ICD-10-CM | POA: Diagnosis not present

## 2023-12-29 NOTE — Progress Notes (Unsigned)
 Cardiology Office Note:    Date:  01/01/2024   ID:  [name] Short, DOB 23-Jul-1946, MRN [id number]  PCP:  Sheliah Hatch, MD  Cardiologist:  Little Ishikawa, MD  Electrophysiologist:  Lanier Prude, MD   Referring MD: Sheliah Hatch, MD   Chief Complaint  Patient presents with   Atrial Fibrillation     History of Present Illness:    [name] Short is a 78 y.o. 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  He was initially seen on 11/27/2020.  He has a history of paroxysmal atrial fibrillation but has not been on anticoagulation.  He presented to the ED with slurred speech and left hand weakness on 10/21/20, was found to have acute CVA.  MRI brain showed acute infarct in the posterior right frontal lobe.  He had residual left-sided weakness and dysarthria.  Echocardiogram showed normal LVEF.  He was started on Eliquis 5 mg twice daily, stroke likely secondary to atrial fibrillation.  He had paroxysmal atrial fibrillation while in the hospital, heart rate 90s to 100s while in A. fib.  He was started on Cardizem.  He presented to [facility]/01/[facility] with chest pain, found to have inferior STEMI.  Cath showed occluded distal LAD, treated with DES x1.  Also noted to have severe stenosis in mid LAD for which he underwent staged intervention with DES x1.  Plan was for triple therapy for 1 month with aspirin, Plavix, and Eliquis then stop aspirin.  Echocardiogram showed EF 50 to 55%.  Cardiac MRI was done on 12/03/2022 which showed normal LV size, wall thickness, and systolic function (EF 58%), normal RV size and systolic function (EF 58%), no LGE.  Zio patch x14 days 01/2021 showed 18% A. fib burden with longest episode lasting 1 day 7 hours, average heart rate in A. fib 80 bpm.  Also showed 5 episodes of SVT, longest lasting 18 seconds.  He had ongoing issues with hematuria and was referred to  Dr. Lalla Brothers for [facility] evaluation.  Underwent successful watchman placement 06/2022.  Since last clinic visit, he reports he is doing well.  Denies any chest pain, dyspnea, lightheadedness, syncope, lower extremity edema, or palpitations.  Does yard work for exercise, will start tennis tennis again next week.  He denies any bleeding issues.   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 Lalla Brothers    Past Surgical History:  Procedure Laterality Date   APPENDECTOMY  1983   CARDIOVASCULAR STRESS TEST  05-08-2010  DR WALL   NO EVIDENCE OF SCAR OR ISCHEMIA/ EF 54%/  PT HAD BOTH AFIB/ AFLUTTER DURING STUDY   CARPAL  TUNNEL RELEASE Right 01/ 14/ 2021   CORONARY STENT INTERVENTION N/A 05/02/2022   Procedure: CORONARY STENT INTERVENTION;  Surgeon: Kathleene Hazel, MD;  [facility]: [facility];  Service: Cardiovascular;  Laterality: N/A;   CORONARY/GRAFT ACUTE MI REVASCULARIZATION N/A 05/01/2022   Procedure: Coronary/Graft Acute MI Revascularization;  Surgeon: Kathleene Hazel, MD;  Location: [facility];  Service: Cardiovascular;  Laterality: N/A;   CYSTOSCOPY W/ URETERAL STENT PLACEMENT Left 09/20/2013   Procedure: CYSTOSCOPY WITH STENT REPLACEMENT;  Surgeon: Antony Haste, MD;  Location[facility];  Service: Urology;  Laterality: Left;   CYSTOSCOPY WITH URETEROSCOPY Left 09/20/2013   Procedure: CYSTOSCOPY WITH URETEROSCOPY;  Surgeon: Antony Haste, MD;  Location: [facility];  Service: Urology;  Laterality: Left;   CYSTOSCOPY WITH URETEROSCOPY AND STENT PLACEMENT Left 07/12/2013   Procedure: CYSTOSCOPY WITH LITHOLAPEXY, LEFT URETERAL STENT PLACEMENT;  Surgeon: Antony Haste, MD;  Location: [facility];  Service: Urology;  Laterality: Left;   CYSTOSCOPY WITH URETEROSCOPY AND STENT PLACEMENT Bilateral 06/23/2014   Procedure: BILATERAL URETEROSCOPY, HOLMIUM LASER LITHOTRIPSY AND STENT PLACEMENT, RIGHT ;  Surgeon: Jerilee Field, MD;  Location: [facility];  Service: Urology;  Laterality: Bilateral;   EXTRACORPOREAL SHOCK WAVE LITHOTRIPSY  X2   EXTRACORPOREAL SHOCK WAVE LITHOTRIPSY Left 08-29-2013;   07-28-2013   EXTRACORPOREAL SHOCK WAVE LITHOTRIPSY Right 12/09/2018   Procedure: EXTRACORPOREAL SHOCK WAVE LITHOTRIPSY (ESWL);  Surgeon: Jerilee Field, MD;  Location: [facility];  Service: Urology;  Laterality: Right;   HOLMIUM LASER APPLICATION Left 09/20/2013   Procedure: HOLMIUM LASER APPLICATION;  Surgeon: Antony Haste, MD;  Location[facility]  Service: Urology;  Laterality: Left;   INGUINAL HERNIA REPAIR Bilateral 10/16/2020   Procedure: BILATERAL OPEN INGUINAL HERNIA REPAIR WITH MESH;  Surgeon: Abigail Miyamoto, MD;  Location: [facility];  Service: General;  Laterality: Bilateral;  LMA/TAP BLOCK   LAPAROSCOPIC INGUINAL HERNIA REPAIR Bilateral 09/04/2010   w/ mesh   LEFT ATRIAL APPENDAGE OCCLUSION N/A 07/17/2022   Procedure: LEFT ATRIAL APPENDAGE OCCLUSION;  Surgeon: Lanier Prude, MD;  [facility]: [facility];  Service: Cardiovascular;  Laterality: N/A;   LEFT HEART CATH AND CORONARY ANGIOGRAPHY N/A 05/01/2022   Procedure: LEFT HEART CATH AND CORONARY  ANGIOGRAPHY;  Surgeon: Kathleene Hazel, MD;  Location: [facility];  Service: Cardiovascular;  Laterality: N/A;   TEE WITHOUT CARDIOVERSION N/A 07/17/2022   Procedure: TRANSESOPHAGEAL ECHOCARDIOGRAM (TEE);  Surgeon: Lanier Prude, MD;  Location: [facility] INVASIVE CV LAB;  Service: Cardiovascular;  Laterality: N/A;   TONSILLECTOMY  AS CHILD   TOTAL HIP ARTHROPLASTY Left 05/11/2002   TOTAL HIP ARTHROPLASTY Right 11/15/2012   Procedure: RIGHT TOTAL HIP ARTHROPLASTY ANTERIOR APPROACH;  Surgeon: Eldred Manges, MD;  Location: [facility];  Service: Orthopedics;  Laterality: Right;  Right total hip arthroplasty   TRANSTHORACIC ECHOCARDIOGRAM  05/08/2010   NORMAL LVSF/  EF 55%/  GRADE I DIASTOLIC DYSFUNCTION/  MILD BILATERAL ATRIUM ENLARGEMENT   Watchemn device  07/07/2022    Current Medications: Current Meds  Medication Sig   acetaminophen (TYLENOL) 500 MG tablet Take 500-1,000 mg by mouth every 6 (six) hours as needed for moderate pain.   allopurinol (ZYLOPRIM) 300 MG tablet Take 1 tablet (300 mg total) by mouth daily.   aspirin EC 81 MG tablet Take 1 tablet (81 mg total) by mouth daily. Swallow whole.   ciclopirox (PENLAC) 8 % solution Apply topically at  bedtime. Apply over nail and surrounding skin. Apply daily over previous coat. After seven (7) days, may remove with alcohol and continue cycle.   colchicine 0.6 MG tablet Take 1 tablet (0.6 mg total) by mouth daily as needed (gout).   diphenhydrAMINE (ALLERGY RELIEF) 25 mg capsule Take 25 mg by mouth every 6 (six) hours as needed.   melatonin 5 MG TABS Take 5 mg by mouth at bedtime as needed (sleep).   metoprolol succinate (TOPROL-XL) 25 MG 24 hr tablet TAKE 1 TABLET BY MOUTH DAILY   Multiple Vitamin (MULTIVITAMIN WITH MINERALS) TABS Take 1 tablet by mouth daily.   nitroGLYCERIN (NITROSTAT) 0.4 MG SL tablet Place 1 tablet (0.4 mg total) under the tongue every 5 (five) minutes as needed for chest pain.   Polyethylene Glycol 400 (BLINK  TEARS) 0.25 % SOLN Place 1 drop into both eyes 2 (two) times daily as needed (for dryness or irritation).   rosuvastatin (CRESTOR) 40 MG tablet Take 1 tablet (40 mg total) by mouth daily.   sodium chloride (OCEAN) 0.65 % SOLN nasal spray Place 1 spray into both nostrils as needed for congestion.   tadalafil (CIALIS) 5 MG tablet Take 5 mg by mouth daily as needed for erectile dysfunction.   trolamine salicylate (ASPERCREME) 10 % cream Apply 1 Application topically as needed for muscle pain (Heel).     Allergies:   Shellfish-derived products and Warfarin sodium   Social History   Socioeconomic History   Marital status: Married    Spouse name: Dawn   Number of children: Not on file   Years of education: Not on file   Highest education level: Bachelor's degree (e.g., BA, AB, BS)  Occupational History   Occupation: retired  Tobacco Use   Smoking status: Former    Types: Cigars   Smokeless tobacco: Never   Tobacco comments:    AVERAGE 3 CIGARS PER WEEK  Vaping Use   Vaping status: Never Used  Substance and Sexual Activity   Alcohol use: Not Currently   Drug use: No   Sexual activity: Not Currently  Other Topics Concern   Not on file  Social History Narrative   Not on file   Social Drivers of Health   Financial Resource Strain: Low Risk  (11/20/2023)   Overall Financial Resource Strain (CARDIA)    Difficulty of Paying Living Expenses: Not hard at all  Food Insecurity: No Food Insecurity (11/20/2023)   Hunger Vital Sign    Worried About Running Out of Food in the Last Year: Never true    Ran Out of Food in the Last Year: Never true  Transportation Needs: No Transportation Needs (11/20/2023)   PRAPARE - Administrator, Civil Service (Medical): No    Lack of Transportation (Non-Medical): No  Physical Activity: Insufficiently Active (11/20/2023)   Exercise Vital Sign    Days of Exercise per Week: 3 days    Minutes of Exercise per Session: 40 min  Stress: Stress  Concern Present (11/20/2023)   [employer] of Occupational Health - Occupational Stress Questionnaire    Feeling of Stress : To some extent  Social Connections: Moderately Integrated (11/20/2023)   Social Connection and Isolation Panel [NHANES]    Frequency of Communication with Friends and Family: Once a week    Frequency of Social Gatherings with Friends and Family: Once a week    Attends Religious Services: 1 to 4 times per year    Active Member of [employer] or Organizations: Yes  Attends Banker Meetings: 1 to 4 times per year    Marital Status: Married     Family History: The patient's family history includes Arthritis in his mother; Bipolar disorder in his brother; Dementia in his brother and mother; Diabetes in his brother, maternal grandfather, maternal grandmother, mother, and sister; Heart disease in his father; Neuropathy in his brother; Prostate cancer in his father; Rheum arthritis in his mother.  ROS:   Please see the history of present illness.     All other systems reviewed and are negative.  EKGs/Labs/Other Studies Reviewed:    The following studies were reviewed today:  14-Day Monitor 01/03/2021: 18% A. fib burden, longest episode lasting 1 day 7 hours. Average heart rate in A. fib 80 bpm 5 episodes of SVT, longest lasting 18 seconds with average rate 94 bpm   Patch Wear Time:  14 days and 0 hours (2022-03-17T12:53:47-0400 to 2022-03-31T12:53:51-0400)   Patient had a min HR of 41 bpm, max HR of 144 bpm, and avg HR of 61 bpm. Predominant underlying rhythm was Sinus Rhythm. 5 Supraventricular Tachycardia runs occurred, the run with the fastest interval lasting 14 beats with a max rate of 133 bpm, the  longest lasting 17.9 secs with an avg rate of 94 bpm. Atrial Fibrillation occurred (18% burden), ranging from 49-144 bpm (avg of 80 bpm), the longest lasting 1 day 7 hours with an avg rate of 83 bpm. Isolated SVEs were rare (<1.0%), SVE Couplets were  rare  (<1.0%), and SVE Triplets were rare (<1.0%). Isolated VEs were rare (<1.0%), VE Couplets were rare (<1.0%), and no VE Triplets were present.  No patient triggered events.  Echo 10/23/2020: 1. Pt in atrial fibrillation at time of study.   2. Left ventricular ejection fraction, by estimation, is 60 to 65%. The  left ventricle has normal function. The left ventricle has no regional  wall motion abnormalities. There is mild left ventricular hypertrophy.  Left ventricular diastolic function  could not be evaluated.   3. Right ventricular systolic function is normal. The right ventricular  size is normal. Tricuspid regurgitation signal is inadequate for assessing  PA pressure.   4. The mitral valve is normal in structure. Trivial mitral valve  regurgitation. No evidence of mitral stenosis.   5. The aortic valve is tricuspid. Aortic valve regurgitation is trivial.  No aortic stenosis is present.   6. The inferior vena cava is normal in size with greater than 50%  respiratory variability, suggesting right atrial pressure of 3 mmHg.   EKG:    01/01/24: Atrial fibrillation, rate 90, poor R wave progression 04/04/2022: Normal sinus rhythm, rate 65, no ST abnormality 08/2021: sinus rhythm, rate 54,no ST abnormalities 02/27/2021: normal sinus rhythm, rate 52, Nonspecific T wave flattening 11/27/2020: normal sinus rhythm, rate 65, no ST abnormalities  Recent Labs: 11/24/2023: ALT 24; BUN 23; Creatinine, Ser 1.15; Hemoglobin 15.4; Platelets 170.0; Potassium 4.3; Sodium 141  Recent Lipid Panel    Component Value Date/Time   CHOL 91 11/24/2023 0936   TRIG 120.0 11/24/2023 0936   HDL 40.30 11/24/2023 0936   CHOLHDL 2 11/24/2023 0936   VLDL 24.0 11/24/2023 0936   LDLCALC 26 11/24/2023 0936   LDLDIRECT 128.1 05/17/2012 0901    Physical Exam:    VS:  BP 120/70   Pulse 90   Ht 5\' 7"  (1.702 m)   Wt 183 lb 12.8 oz (83.4 kg)   SpO2 98%   BMI 28.79 kg/m     Wt  Readings from Last 3 Encounters:   01/01/24 183 lb 12.8 oz (83.4 kg)  11/24/23 184 lb (83.5 kg)  07/01/23 179 lb (81.2 kg)     GEN:  in no acute distress HEENT: Normal NECK: No JVD; No carotid bruits CARDIAC: Irregular, normal rate no murmurs, rubs, gallops RESPIRATORY:  Clear to auscultation without rales, wheezing or rhonchi  ABDOMEN: Soft, non-tender, non-distended MUSCULOSKELETAL:  No edema; No deformity  SKIN: Warm and dry NEUROLOGIC:  Alert and oriented x 3 PSYCHIATRIC:  Normal affect   ASSESSMENT:    1. Coronary artery disease involving native coronary artery of native heart, unspecified whether angina present   2. Paroxysmal a-fib (HCC)   3. Cerebrovascular accident (CVA) due to embolism of right middle cerebral artery (HCC)   4. Essential hypertension   5. Hyperlipidemia, unspecified hyperlipidemia type       PLAN:    CAD: [facility] with chest pain, found to have inferior STEMI.  Cath showed occluded distal LAD, treated with DES x1.  Also noted to have severe stenosis in mid LAD for which he underwent staged intervention with DES x1.  Echocardiogram showed EF 50 to 55%. -Plan was for triple therapy for 1 month with aspirin, Plavix, and Eliquis then stop aspirin.  Subsequently underwent watchman placement 06/2022 so stopped Eliquis after 45 days (08/31/2022).  With stopping Eliquis restarted aspirin to complete 1 year of DAPT (through 04/2023).  Now off Plavix and continuing aspirin only -Continue rosuvastatin 40 mg daily -Continue Toprol-XL 25 mg daily  Paroxysmal atrial fibrillation: CHA2DS2-VASc score 4 (age, hypertension, CVA x2).  Echocardiogram 10/23/2020 showed normal biventricular function, no significant valvular disease.  Zio patch x14 days showed 18% A. fib burden with longest episode lasting 1 day 7 hours, average heart rate in A. fib 80 bpm.  Also showed 5 episodes of SVT, longest lasting 18 seconds.  He has had ongoing issues with hematuria due to kidney stones, for which he  follows with urology.  Given this, was referred to Dr. Lalla Brothers and underwent Watchman placement 06/2022. -Completed 45 days of Eliquis post watchman placement, have discontinued Eliquis -Continue Toprol-XL 25 mg daily -Sleep study shows mild OSA, no indication for CPAP at this time - He is back in A-fib in clinic today.  Rates appear controlled, and he is asymptomatic.  Will check Zio patch x 7 days to evaluate for adequate rate control and to see if having paroxysmal versus persistent A-fib.  Update echocardiogram  LVH: Noted on echocardiogram, also has history of bilateral carpal tunnel syndrome.  Cardiac MRI was done on 12/03/2022 which showed normal LV size, wall thickness, and systolic function (EF 58%), normal RV size and systolic function (EF 58%), no LGE; no evidence of cardiac amyloidosis  CVA: Presented 10/21/2020 with left-sided weakness, found to have acute CVA.  Likely due to A. fib as above.   Hyperlipidemia: on rosuvastatin 40 mg daily.  LDL 26 on 10/2023  Hypertension: Continue Toprol-XL 25 mg daily.  Appears controlled  Snoring: Sleep study shows mild OSA, no indication for CPAP at this time  Hematuria: Likely due to kidney stones.  Follows with urology.   Given ongoing need for anticoagulation with atrial fibrillation and history of CVA, referred to Dr. Lalla Brothers and underwent watchman placement as above   RTC in 6 months    Medication Adjustments/Labs and Tests Ordered: Current medicines are reviewed at length with the patient today.  Concerns regarding medicines are outlined above.  Orders Placed This  Encounter  Procedures   LONG TERM MONITOR (3-14 DAYS)   EKG 12-Lead   ECHOCARDIOGRAM COMPLETE    No orders of the defined types were placed in this encounter.    Patient Instructions  Medication Instructions:  No Changes  Lab Work: None  Testing/Procedures: Your physician has requested that you have an echocardiogram. Echocardiography is a painless test that  uses sound waves to create images of your heart. It provides your doctor with information about the size and shape of your heart and how well your heart's chambers and valves are working. This procedure takes approximately one hour. There are no restrictions for this procedure. Please do NOT wear cologne, perfume, aftershave, or lotions (deodorant is allowed). Please arrive 15 minutes prior to your appointment time.    ZIO XT- Long Term Monitor Instructions  Your physician has requested you wear a ZIO patch monitor for 7 days.  This is a single patch monitor. [employer] supplies one patch monitor per enrollment. Additional stickers are not available. Please do not apply patch if you will be having a Nuclear Stress Test,  Echocardiogram, Cardiac CT, MRI, or Chest Xray during the period you would be wearing the  monitor. The patch cannot be worn during these tests. You cannot remove and re-apply the  ZIO XT patch monitor.  Your ZIO patch monitor will be mailed 3 day USPS to your address on file. It may take 3-5 days  to receive your monitor after you have been enrolled.  Once you have received your monitor, please review the enclosed instructions. Your monitor  has already been registered assigning a specific monitor serial # to you.  Billing and Patient Assistance Program Information  We have supplied [employer] with any of your insurance information on file for billing purposes. [employer] offers a sliding scale Patient Assistance Program for patients that do not have  insurance, or whose insurance does not completely cover the cost of the ZIO monitor.  You must apply for the Patient Assistance Program to qualify for this discounted rate.  To apply, please call [employer] at [phone], select option 4, select option 2, ask to apply for  Patient Assistance Program. Meredeth Ide will ask your household income, and how many people  are in your household. They will quote your out-of-pocket cost based on that  information.  [employer] will also be able to set up a 68-month, interest-free payment plan if needed.  Applying the monitor   Shave hair from upper left chest.  Hold abrader disc by orange tab. Rub abrader in 40 strokes over the upper left chest as  indicated in your monitor instructions.  Clean area with 4 enclosed alcohol pads. Let dry.  Apply patch as indicated in monitor instructions. Patch will be placed under collarbone on left  side of chest with arrow pointing upward.  Rub patch adhesive wings for 2 minutes. Remove white label marked "1". Remove the white  label marked "2". Rub patch adhesive wings for 2 additional minutes.  While looking in a mirror, press and release button in center of patch. A small green light will  flash 3-4 times. This will be your only indicator that the monitor has been turned on.  Do not shower for the first 24 hours. You may shower after the first 24 hours.  Press the button if you feel a symptom. You will hear a small click. Record Date, Time and  Symptom in the Patient Logbook.  When you are ready to remove the patch, follow  instructions on the last 2 pages of Patient  Logbook. Stick patch monitor onto the last page of Patient Logbook.  Place Patient Logbook in the blue and white box. Use locking tab on box and tape box closed  securely. The blue and white box has prepaid postage on it. Please place it in the mailbox as  soon as possible. Your physician should have your test results approximately 7 days after the  monitor has been mailed back to [facility].  Call [facility] Customer Care at [phone] if you have questions regarding  your ZIO XT patch monitor. Call them immediately if you see an orange light blinking on your  monitor.  If your monitor falls off in less than 4 days, contact our Monitor department at [phone].  If your monitor becomes loose or falls off after 4 days call [employer] at [phone] for  suggestions on  securing your monitor  Follow-Up: At [facility], you and your health needs are our priority.  As part of our continuing mission to provide you with exceptional heart care, our providers are all part of one team.  This team includes your primary Cardiologist (physician) and Advanced Practice Providers or APPs (Physician Assistants and Nurse Practitioners) who all work together to provide you with the care you need, when you need it.  Your next appointment:   6 month(s)  Provider:   Little Ishikawa, MD       Valet parking services will be available as well.        Signed, Little Ishikawa, MD  01/01/2024 1:58 PM     [facility]

## 2024-01-01 ENCOUNTER — Encounter: Payer: Self-pay | Admitting: Cardiology

## 2024-01-01 ENCOUNTER — Ambulatory Visit (INDEPENDENT_AMBULATORY_CARE_PROVIDER_SITE_OTHER)

## 2024-01-01 ENCOUNTER — Ambulatory Visit: Payer: Medicare Other | Attending: Cardiology | Admitting: Cardiology

## 2024-01-01 VITALS — BP 120/70 | HR 90 | Ht 67.0 in | Wt 183.8 lb

## 2024-01-01 DIAGNOSIS — I480 Paroxysmal atrial fibrillation: Secondary | ICD-10-CM | POA: Diagnosis not present

## 2024-01-01 DIAGNOSIS — I10 Essential (primary) hypertension: Secondary | ICD-10-CM | POA: Insufficient documentation

## 2024-01-01 DIAGNOSIS — I63411 Cerebral infarction due to embolism of right middle cerebral artery: Secondary | ICD-10-CM | POA: Diagnosis not present

## 2024-01-01 DIAGNOSIS — E785 Hyperlipidemia, unspecified: Secondary | ICD-10-CM | POA: Diagnosis not present

## 2024-01-01 DIAGNOSIS — I2510 Atherosclerotic heart disease of native coronary artery without angina pectoris: Secondary | ICD-10-CM | POA: Insufficient documentation

## 2024-01-01 NOTE — Patient Instructions (Signed)
 Medication Instructions:  No Changes  Lab Work: None  Testing/Procedures: Your physician has requested that you have an echocardiogram. Echocardiography is a painless test that uses sound waves to create images of your heart. It provides your doctor with information about the size and shape of your heart and how well your heart's chambers and valves are working. This procedure takes approximately one hour. There are no restrictions for this procedure. Please do NOT wear cologne, perfume, aftershave, or lotions (deodorant is allowed). Please arrive 15 minutes prior to your appointment time.    ZIO XT- Long Term Monitor Instructions  Your physician has requested you wear a ZIO patch monitor for 7 days.  This is a single patch monitor. [employer] supplies one patch monitor per enrollment. Additional stickers are not available. Please do not apply patch if you will be having a Nuclear Stress Test,  Echocardiogram, Cardiac CT, MRI, or Chest Xray during the period you would be wearing the  monitor. The patch cannot be worn during these tests. You cannot remove and re-apply the  ZIO XT patch monitor.  Your ZIO patch monitor will be mailed 3 day USPS to your address on file. It may take 3-5 days  to receive your monitor after you have been enrolled.  Once you have received your monitor, please review the enclosed instructions. Your monitor  has already been registered assigning a specific monitor serial # to you.  Billing and Patient Assistance Program Information  We have supplied [employer] with any of your insurance information on file for billing purposes. [employer] offers a sliding scale Patient Assistance Program for patients that do not have  insurance, or whose insurance does not completely cover the cost of the ZIO monitor.  You must apply for the Patient Assistance Program to qualify for this discounted rate.  To apply, please call [employer] at [phone], select option 4, select option 2,  ask to apply for  Patient Assistance Program. Meredeth Ide will ask your household income, and how many people  are in your household. They will quote your out-of-pocket cost based on that information.  [employer] will also be able to set up a 64-month, interest-free payment plan if needed.  Applying the monitor   Shave hair from upper left chest.  Hold abrader disc by orange tab. Rub abrader in 40 strokes over the upper left chest as  indicated in your monitor instructions.  Clean area with 4 enclosed alcohol pads. Let dry.  Apply patch as indicated in monitor instructions. Patch will be placed under collarbone on left  side of chest with arrow pointing upward.  Rub patch adhesive wings for 2 minutes. Remove white label marked "1". Remove the white  label marked "2". Rub patch adhesive wings for 2 additional minutes.  While looking in a mirror, press and release button in center of patch. A small green light will  flash 3-4 times. This will be your only indicator that the monitor has been turned on.  Do not shower for the first 24 hours. You may shower after the first 24 hours.  Press the button if you feel a symptom. You will hear a small click. Record Date, Time and  Symptom in the Patient Logbook.  When you are ready to remove the patch, follow instructions on the last 2 pages of Patient  Logbook. Stick patch monitor onto the last page of Patient Logbook.  Place Patient Logbook in the blue and white box. Use locking tab on box and tape box closed  securely. The blue and white box has prepaid postage on it. Please place it in the mailbox as  soon as possible. Your physician should have your test results approximately 7 days after the  monitor has been mailed back to [facility].  Call [facility] Customer Care at [phone] if you have questions regarding  your ZIO XT patch monitor. Call them immediately if you see an orange light blinking on your  monitor.  If your monitor falls off  in less than 4 days, contact our Monitor department at [phone].  If your monitor becomes loose or falls off after 4 days call [employer] at [phone] for  suggestions on securing your monitor  Follow-Up: At [facility], you and your health needs are our priority.  As part of our continuing mission to provide you with exceptional heart care, our providers are all part of one team.  This team includes your primary Cardiologist (physician) and Advanced Practice Providers or APPs (Physician Assistants and Nurse Practitioners) who all work together to provide you with the care you need, when you need it.  Your next appointment:   6 month(s)  Provider:   Little Ishikawa, MD       Valet parking services will be available as well.

## 2024-01-01 NOTE — Progress Notes (Unsigned)
Enrolled patient for a 7 day Zio XT to be mailed to patients home  

## 2024-01-20 DIAGNOSIS — M1812 Unilateral primary osteoarthritis of first carpometacarpal joint, left hand: Secondary | ICD-10-CM | POA: Diagnosis not present

## 2024-01-20 DIAGNOSIS — M79642 Pain in left hand: Secondary | ICD-10-CM | POA: Diagnosis not present

## 2024-01-20 DIAGNOSIS — G5602 Carpal tunnel syndrome, left upper limb: Secondary | ICD-10-CM | POA: Diagnosis not present

## 2024-01-20 DIAGNOSIS — M19042 Primary osteoarthritis, left hand: Secondary | ICD-10-CM | POA: Diagnosis not present

## 2024-01-25 DIAGNOSIS — I480 Paroxysmal atrial fibrillation: Secondary | ICD-10-CM | POA: Diagnosis not present

## 2024-02-01 ENCOUNTER — Encounter: Payer: Self-pay | Admitting: Family Medicine

## 2024-02-01 DIAGNOSIS — Z1211 Encounter for screening for malignant neoplasm of colon: Secondary | ICD-10-CM

## 2024-02-08 ENCOUNTER — Ambulatory Visit (HOSPITAL_COMMUNITY): Attending: Cardiology

## 2024-02-08 DIAGNOSIS — I480 Paroxysmal atrial fibrillation: Secondary | ICD-10-CM | POA: Diagnosis not present

## 2024-02-08 LAB — ECHOCARDIOGRAM COMPLETE
Area-P 1/2: 3.77 cm2
S' Lateral: 3.1 cm

## 2024-02-11 ENCOUNTER — Ambulatory Visit: Payer: Self-pay | Admitting: *Deleted

## 2024-02-14 ENCOUNTER — Other Ambulatory Visit: Payer: Self-pay

## 2024-02-14 ENCOUNTER — Encounter (HOSPITAL_BASED_OUTPATIENT_CLINIC_OR_DEPARTMENT_OTHER): Payer: Self-pay | Admitting: Emergency Medicine

## 2024-02-14 ENCOUNTER — Emergency Department (HOSPITAL_BASED_OUTPATIENT_CLINIC_OR_DEPARTMENT_OTHER)
Admission: EM | Admit: 2024-02-14 | Discharge: 2024-02-15 | Disposition: A | Discharge status: Home / Self Care | Attending: Emergency Medicine | Admitting: Emergency Medicine

## 2024-02-14 DIAGNOSIS — M545 Low back pain, unspecified: Secondary | ICD-10-CM

## 2024-02-14 DIAGNOSIS — R0989 Other specified symptoms and signs involving the circulatory and respiratory systems: Secondary | ICD-10-CM | POA: Diagnosis not present

## 2024-02-14 DIAGNOSIS — I2510 Atherosclerotic heart disease of native coronary artery without angina pectoris: Secondary | ICD-10-CM | POA: Insufficient documentation

## 2024-02-14 DIAGNOSIS — Z955 Presence of coronary angioplasty implant and graft: Secondary | ICD-10-CM | POA: Insufficient documentation

## 2024-02-14 DIAGNOSIS — M51369 Other intervertebral disc degeneration, lumbar region without mention of lumbar back pain or lower extremity pain: Secondary | ICD-10-CM

## 2024-02-14 DIAGNOSIS — Z7982 Long term (current) use of aspirin: Secondary | ICD-10-CM | POA: Diagnosis not present

## 2024-02-14 DIAGNOSIS — R059 Cough, unspecified: Secondary | ICD-10-CM | POA: Diagnosis not present

## 2024-02-14 DIAGNOSIS — J209 Acute bronchitis, unspecified: Secondary | ICD-10-CM | POA: Insufficient documentation

## 2024-02-14 DIAGNOSIS — R531 Weakness: Secondary | ICD-10-CM | POA: Diagnosis not present

## 2024-02-14 DIAGNOSIS — M5136 Other intervertebral disc degeneration, lumbar region with discogenic back pain only: Secondary | ICD-10-CM | POA: Insufficient documentation

## 2024-02-14 DIAGNOSIS — J9811 Atelectasis: Secondary | ICD-10-CM | POA: Diagnosis not present

## 2024-02-14 DIAGNOSIS — M4186 Other forms of scoliosis, lumbar region: Secondary | ICD-10-CM | POA: Diagnosis not present

## 2024-02-14 DIAGNOSIS — M48061 Spinal stenosis, lumbar region without neurogenic claudication: Secondary | ICD-10-CM | POA: Diagnosis not present

## 2024-02-14 LAB — RESP PANEL BY RT-PCR (RSV, FLU A&B, COVID)  RVPGX2
Influenza A by PCR: NEGATIVE
Influenza B by PCR: NEGATIVE
Resp Syncytial Virus by PCR: NEGATIVE
SARS Coronavirus 2 by RT PCR: NEGATIVE

## 2024-02-14 NOTE — ED Triage Notes (Signed)
 Pt c/o nasal congestion x 2 wks, cough, general weakness, decreased appetite; reports 2 falls in the last week, c/o back pain making it difficult to walk

## 2024-02-14 NOTE — ED Notes (Signed)
 Pt here tonight with increased weakness over the past week and congestion.   Pt has congested cough, wife was sick last week.   Pt has fallen x 2 this week, reports lower back pain and leg leg pain/weakness

## 2024-02-15 ENCOUNTER — Emergency Department (HOSPITAL_BASED_OUTPATIENT_CLINIC_OR_DEPARTMENT_OTHER)

## 2024-02-15 DIAGNOSIS — R059 Cough, unspecified: Secondary | ICD-10-CM | POA: Diagnosis not present

## 2024-02-15 DIAGNOSIS — J9811 Atelectasis: Secondary | ICD-10-CM | POA: Diagnosis not present

## 2024-02-15 DIAGNOSIS — M545 Low back pain, unspecified: Secondary | ICD-10-CM | POA: Diagnosis not present

## 2024-02-15 DIAGNOSIS — M48061 Spinal stenosis, lumbar region without neurogenic claudication: Secondary | ICD-10-CM | POA: Diagnosis not present

## 2024-02-15 DIAGNOSIS — R531 Weakness: Secondary | ICD-10-CM | POA: Diagnosis not present

## 2024-02-15 DIAGNOSIS — R0989 Other specified symptoms and signs involving the circulatory and respiratory systems: Secondary | ICD-10-CM | POA: Diagnosis not present

## 2024-02-15 DIAGNOSIS — M4186 Other forms of scoliosis, lumbar region: Secondary | ICD-10-CM | POA: Diagnosis not present

## 2024-02-15 DIAGNOSIS — J209 Acute bronchitis, unspecified: Secondary | ICD-10-CM | POA: Diagnosis not present

## 2024-02-15 LAB — BASIC METABOLIC PANEL WITH GFR
Anion gap: 16 — ABNORMAL HIGH (ref 5–15)
BUN: 17 mg/dL (ref 8–23)
CO2: 23 mmol/L (ref 22–32)
Calcium: 9.3 mg/dL (ref 8.9–10.3)
Chloride: 102 mmol/L (ref 98–111)
Creatinine, Ser: 1.19 mg/dL (ref 0.61–1.24)
GFR, Estimated: >60 mL/min (ref >60)
Glucose, Bld: 110 mg/dL — ABNORMAL HIGH (ref 70–99)
Potassium: 3.8 mmol/L (ref 3.5–5.1)
Sodium: 140 mmol/L (ref 135–145)

## 2024-02-15 LAB — CBC WITH DIFFERENTIAL/PLATELET
Abs Immature Granulocytes: 0.06 10*3/uL (ref 0.00–0.07)
Basophils Absolute: 0.1 10*3/uL (ref 0.0–0.1)
Basophils Relative: 0 %
Eosinophils Absolute: 0.1 10*3/uL (ref 0.0–0.5)
Eosinophils Relative: 0 %
HCT: 47.2 % (ref 39.0–52.0)
Hemoglobin: 15.9 g/dL (ref 13.0–17.0)
Immature Granulocytes: 0 %
Lymphocytes Relative: 14 %
Lymphs Abs: 2.3 10*3/uL (ref 0.7–4.0)
MCH: 29.9 pg (ref 26.0–34.0)
MCHC: 33.7 g/dL (ref 30.0–36.0)
MCV: 88.9 fL (ref 80.0–100.0)
Monocytes Absolute: 1.6 10*3/uL — ABNORMAL HIGH (ref 0.1–1.0)
Monocytes Relative: 10 %
Neutro Abs: 12 10*3/uL — ABNORMAL HIGH (ref 1.7–7.7)
Neutrophils Relative %: 76 %
Platelets: 241 10*3/uL (ref 150–400)
RBC: 5.31 MIL/uL (ref 4.22–5.81)
RDW: 12.9 % (ref 11.5–15.5)
WBC: 16 10*3/uL — ABNORMAL HIGH (ref 4.0–10.5)
nRBC: 0 % (ref 0.0–0.2)

## 2024-02-15 LAB — TROPONIN T, HIGH SENSITIVITY: Troponin T High Sensitivity: <15 ng/L (ref <19)

## 2024-02-15 MED ORDER — PREDNISONE 20 MG PO TABS
40.0000 mg | ORAL_TABLET | Freq: Once | ORAL | Status: AC
  Administered 2024-02-15: 40 mg via ORAL
  Filled 2024-02-15: qty 2

## 2024-02-15 MED ORDER — AZITHROMYCIN 250 MG PO TABS
500.0000 mg | ORAL_TABLET | Freq: Once | ORAL | Status: AC
Start: 2024-02-15 — End: 2024-02-15
  Administered 2024-02-15: 500 mg via ORAL
  Filled 2024-02-15: qty 2

## 2024-02-15 MED ORDER — PREDNISONE 10 MG PO TABS: 20.0000 mg | ORAL_TABLET | Freq: Two times a day (BID) | ORAL | 0 refills | Status: AC

## 2024-02-15 MED ORDER — AZITHROMYCIN 250 MG PO TABS: 250.0000 mg | ORAL_TABLET | Freq: Every day | ORAL | 0 refills | Status: DC

## 2024-02-15 NOTE — Discharge Instructions (Signed)
 Begin taking Zithromax  and prednisone  as prescribed.  Follow-up with primary doctor in the next week, and return to the ER if symptoms significantly worsen or change.

## 2024-02-15 NOTE — ED Notes (Signed)
Redraw for K sent.

## 2024-02-15 NOTE — ED Provider Notes (Signed)
 [facility] Provider Note   CSN: [id number] Arrival date & time: 02/14/24  2208     History  Chief Complaint  Patient presents with   Nasal Congestion   Fall   Weakness    [name] Short is a 78 y.o. male.  Patient is a 78 year old male with past medical history of coronary artery disease with stent, paroxysmal atrial fibrillation with Watchman device, and prior CVA.  Patient presenting today with complaints of cough and congestion that has been persistent for the last 2 weeks.  He describes nasal and chest congestion and coughing up green sputum.  His wife was recently ill with similar symptoms.  He denies any fevers or chills.  He does report being weak over the past several days and actually did fall in the shower 3 days ago because of this.  No aggravating or alleviating factors.       Home Medications Prior to Admission medications   Medication Sig Start Date End Date Taking? Authorizing Provider  acetaminophen  (TYLENOL ) 500 MG tablet Take 500-1,000 mg by mouth every 6 (six) hours as needed for moderate pain.    [provider]  allopurinol  (ZYLOPRIM ) 300 MG tablet Take 1 tablet (300 mg total) by mouth daily. 11/25/23   Jess Morita, MD  aspirin  EC 81 MG tablet Take 1 tablet (81 mg total) by mouth daily. Swallow whole. 09/01/22   Wendie Hamburg, MD  ciclopirox  (PENLAC ) 8 % solution Apply topically at bedtime. Apply over nail and surrounding skin. Apply daily over previous coat. After seven (7) days, may remove with alcohol and continue cycle. 05/22/23   Jess Morita, MD  colchicine  0.6 MG tablet Take 1 tablet (0.6 mg total) by mouth daily as needed (gout). 09/25/22   Jess Morita, MD  diphenhydrAMINE  (ALLERGY RELIEF) 25 mg capsule Take 25 mg by mouth every 6 (six) hours as needed.    [provider]  melatonin 5 MG TABS Take 5 mg by mouth at bedtime as needed (sleep).    [provider]  metoprolol  succinate (TOPROL -XL) 25 MG 24 hr tablet TAKE 1 TABLET BY MOUTH DAILY 11/19/23   Wendie Hamburg, MD  Multiple Vitamin (MULTIVITAMIN WITH MINERALS) TABS Take 1 tablet by mouth daily.    [provider]  nitroGLYCERIN  (NITROSTAT ) 0.4 MG SL tablet Place 1 tablet (0.4 mg total) under the tongue every 5 (five) minutes as needed for chest pain. 05/14/23   Boyce Byes, MD  Polyethylene Glycol 400 (BLINK TEARS) 0.25 % SOLN Place 1 drop into both eyes 2 (two) times daily as needed (for dryness or irritation).    [provider]  rosuvastatin  (CRESTOR ) 40 MG tablet Take 1 tablet (40 mg total) by mouth daily. 08/25/23   Wendie Hamburg, MD  sodium chloride  (OCEAN) 0.65 % SOLN nasal spray Place 1 spray into both nostrils as needed for congestion.    [provider]  tadalafil (CIALIS) 5 MG tablet Take 5 mg by mouth daily as needed for erectile dysfunction.    [provider]  trolamine salicylate (ASPERCREME) 10 % cream Apply 1 Application topically as needed for muscle pain (Heel).    [provider]      Allergies    Shellfish-derived products and Warfarin sodium    Review of Systems   Review of Systems  All other systems reviewed and are negative.   Physical Exam Updated Vital Signs BP 116/78  Pulse 85   Temp 97.9 F (36.6 C) (Oral)   Resp 20   Ht 5\' 7"  (1.702 m)   Wt 83.4 kg   SpO2 94%   BMI 28.80 kg/m  Physical Exam Vitals and nursing note reviewed.  Constitutional:      General: He is not in acute distress.    Appearance: He is well-developed. He is not diaphoretic.  HENT:     Head: Normocephalic and atraumatic.  Cardiovascular:     Rate and Rhythm: Normal rate and regular rhythm.     Heart sounds: No murmur heard.    No friction rub.  Pulmonary:     Effort: Pulmonary effort is normal. No respiratory distress.     Breath sounds: Normal breath sounds. No wheezing or rales.  Abdominal:      General: Bowel sounds are normal. There is no distension.     Palpations: Abdomen is soft.     Tenderness: There is no abdominal tenderness.  Musculoskeletal:        General: Normal range of motion.     Cervical back: Normal range of motion and neck supple.     Comments: There is mild tenderness in the soft tissues of the lumbar region, but no bony tenderness or step-off.  Skin:    General: Skin is warm and dry.  Neurological:     Mental Status: He is alert and oriented to person, place, and time.     Coordination: Coordination normal.     Comments: Sensation is intact throughout both lower extremities.  Strength is 5 out of 5 in both lower extremities.     ED Results / Procedures / Treatments   Labs (all labs ordered are listed, but only abnormal results are displayed) Labs Reviewed  RESP PANEL BY RT-PCR (RSV, FLU A&B, COVID)  [id number]  BASIC METABOLIC PANEL WITH GFR  CBC WITH DIFFERENTIAL/PLATELET  TROPONIN T, HIGH SENSITIVITY    EKG None  Radiology No results found.  Procedures Procedures    Medications Ordered in ED Medications - No data to display  ED Course/ Medical Decision Making/ A&P  Patient is a 78 year old male presenting with complaints of cough and congestion worsening over the past 2 weeks.  He is also describing some weakness in his legs and back discomfort.  Patient arrives here with stable vital signs and is afebrile.  There is no hypoxia.  Physical examination reveals 5 out of 5 strength throughout both lower extremities and sensation is intact throughout both lower extremities.  Laboratory studies obtained including CBC, basic metabolic panel, and troponin.  He has a white count of 16,000, but laboratory studies otherwise unremarkable.  Troponin is negative.  Respiratory panel also obtained and was negative for COVID/flu/RSV.  Chest x-ray shows atelectasis, but no infiltrate or other acute finding.  I did obtain a CT scan of the lumbar spine due to  his recent fall.  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  He is describing some weakness in his legs and I am uncertain as to whether or not that is the result of his current respiratory infection or nerve impingement.  We did discuss the possibility of sending him to the [facility] for MRI, however patient is declining this at this time.  I will make arrangements for a lumbar spine MRI as an outpatient to further evaluate the CT findings.  Patient also will be given prednisone  for his  back issues and Zithromax  for his URI.  Patient to follow-up/return as needed.  Final Clinical Impression(s) / ED Diagnoses Final diagnoses:  None    Rx / DC Orders ED Discharge Orders     None         Orvilla Blander, MD 02/15/24 [phone]

## 2024-02-19 ENCOUNTER — Telehealth: Payer: Self-pay | Admitting: Cardiology

## 2024-02-19 ENCOUNTER — Ambulatory Visit (HOSPITAL_BASED_OUTPATIENT_CLINIC_OR_DEPARTMENT_OTHER): Admitting: Student

## 2024-02-19 ENCOUNTER — Telehealth: Payer: Self-pay

## 2024-02-19 ENCOUNTER — Encounter (HOSPITAL_BASED_OUTPATIENT_CLINIC_OR_DEPARTMENT_OTHER): Payer: Self-pay | Admitting: Student

## 2024-02-19 DIAGNOSIS — M48062 Spinal stenosis, lumbar region with neurogenic claudication: Secondary | ICD-10-CM | POA: Diagnosis not present

## 2024-02-19 NOTE — Telephone Encounter (Signed)
 See 02/19/2024 MyChart message.

## 2024-02-19 NOTE — Telephone Encounter (Signed)
 Pt was seen Sunday by urgent care and they told him to take Azithromycin  250 mg 1 tablet daily for 10 days and notes he was only given 4 tablets they are asking you to send in 6 more. Please advise

## 2024-02-19 NOTE — Progress Notes (Signed)
 Chief Complaint: Low back pain     History of Present Illness:    [name] Short is a 78 y.o. male who presents to clinic today for evaluation of low back pain.  Patient does have significant history of stroke, MI, and bilateral THA.  He does have a known history of lumbar stenosis and was formally a patient of Dr. Murrel Arnt.  He did have an MRI completed in 2023 which demonstrated multilevel spondylolisthesis, L5-S1 facet arthropathy, moderate to severe L3-L4 spinal stenosis, and mild L4-L5 spinal stenosis with moderate right L4 neural foraminal stenosis.  Today patient reports that about 3 weeks ago he began noticing some instability in his legs after playing tennis.  He subsequently sustained multiple falls in the next few days due to balance and instability issues.  He is experiencing pain in the low back as well as radiating pain, numbness, and tingling into bilateral lower extremities worse on the left.  He has been utilizing a walker at home.  He has tried ibuprofen  and was recently put on prednisone  and an antibiotic for suspected RSV.  Denies any history of lumbar surgeries.  Denies any bowel/bladder dysfunction or saddle anesthesia.  He is on daily aspirin  81 mg.   Surgical History:   No lumbar surgical history  PMH/PSH/Family History/Social History/Meds/Allergies:    Past Medical History:  Diagnosis Date   Arthritis    WRIST   Benign localized prostatic hyperplasia with lower urinary tract symptoms (LUTS)    Complication of anesthesia    slow to wake   ED (erectile dysfunction)    GERD (gastroesophageal reflux disease)    History of concussion    AS CHILD--  NO RESIDUAL   History of gout    History of kidney stones    Hyperlipidemia    Inguinal hernia, bilateral    Nephrolithiasis    BILATERAL   PAF (paroxysmal atrial fibrillation) (HCC) currently being followed by pcp   EPISODE --  2011  FOLLOW-UP W/ DR WALL  /  HAS NOT SEEN ANY  CARDIOLOGIST SINCE   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 Marven Slimmer   Past Surgical History:  Procedure Laterality Date   APPENDECTOMY  1983   CARDIOVASCULAR STRESS TEST  05-08-2010  DR WALL   NO EVIDENCE OF SCAR OR ISCHEMIA/ EF 54%/  PT HAD BOTH AFIB/ AFLUTTER DURING STUDY   CARPAL TUNNEL RELEASE Right 01/ 14/ 2021   CORONARY STENT INTERVENTION N/A 05/02/2022   Procedure: CORONARY STENT INTERVENTION;  Surgeon: Odie Benne, MD;  Location: [facility];  Service: Cardiovascular;  Laterality: N/A;   CORONARY/GRAFT ACUTE MI REVASCULARIZATION N/A 05/01/2022   Procedure: Coronary/Graft Acute MI Revascularization;  Surgeon: Odie Benne, MD;  Location: [facility];  Service: Cardiovascular;  Laterality: N/A;   CYSTOSCOPY W/ URETERAL STENT PLACEMENT Left 09/20/2013   Procedure: CYSTOSCOPY WITH STENT REPLACEMENT;  Surgeon: Moise Anes, MD;  Location: [facility];  Service: Urology;  Laterality: Left;   CYSTOSCOPY WITH URETEROSCOPY Left 09/20/2013   Procedure: CYSTOSCOPY WITH URETEROSCOPY;  Surgeon: Moise Anes, MD;  Location: [facility];  Service: Urology;  Laterality: Left;   CYSTOSCOPY WITH URETEROSCOPY AND STENT PLACEMENT Left 07/12/2013   Procedure: CYSTOSCOPY WITH LITHOLAPEXY, LEFT  URETERAL STENT PLACEMENT;  Surgeon: Moise Anes, MD;  Location: [facility];  Service: Urology;  Laterality: Left;   CYSTOSCOPY WITH URETEROSCOPY AND STENT PLACEMENT Bilateral 06/23/2014   Procedure: BILATERAL URETEROSCOPY, HOLMIUM LASER LITHOTRIPSY AND STENT PLACEMENT, RIGHT ;  Surgeon: Christina Coyer, MD;  Location: [facility];  Service: Urology;  Laterality: Bilateral;   EXTRACORPOREAL SHOCK WAVE LITHOTRIPSY  X2   EXTRACORPOREAL SHOCK WAVE LITHOTRIPSY Left 08-29-2013;   07-28-2013   EXTRACORPOREAL SHOCK WAVE LITHOTRIPSY  Right 12/09/2018   Procedure: EXTRACORPOREAL SHOCK WAVE LITHOTRIPSY (ESWL);  Surgeon: Christina Coyer, MD;  Location: [facility];  Service: Urology;  Laterality: Right;   HOLMIUM LASER APPLICATION Left 09/20/2013   Procedure: HOLMIUM LASER APPLICATION;  Surgeon: Moise Anes, MD;  Location: [facility];  Service: Urology;  Laterality: Left;   INGUINAL HERNIA REPAIR Bilateral 10/16/2020   Procedure: BILATERAL OPEN INGUINAL HERNIA REPAIR WITH MESH;  Surgeon: Oza Blumenthal, MD;  Location: [facility];  Service: General;  Laterality: Bilateral;  LMA/TAP BLOCK   LAPAROSCOPIC INGUINAL HERNIA REPAIR Bilateral 09/04/2010   w/ mesh   LEFT ATRIAL APPENDAGE OCCLUSION N/A 07/17/2022   Procedure: LEFT ATRIAL APPENDAGE OCCLUSION;  Surgeon: Boyce Byes, MD;  Location: [facility];  Service: Cardiovascular;  Laterality: N/A;   LEFT HEART CATH AND CORONARY ANGIOGRAPHY N/A 05/01/2022   Procedure: LEFT HEART CATH AND CORONARY ANGIOGRAPHY;  Surgeon: Odie Benne, MD;  Location: [facility];  Service: Cardiovascular;  Laterality: N/A;   TEE WITHOUT CARDIOVERSION N/A 07/17/2022   Procedure: TRANSESOPHAGEAL ECHOCARDIOGRAM (TEE);  Surgeon: Boyce Byes, MD;  Location: [facility];  Service: Cardiovascular;  Laterality: N/A;   TONSILLECTOMY  AS CHILD   TOTAL HIP ARTHROPLASTY Left 05/11/2002   TOTAL HIP ARTHROPLASTY Right 11/15/2012   Procedure: RIGHT TOTAL HIP ARTHROPLASTY ANTERIOR APPROACH;  Surgeon: Adah Acron, MD;  Location: [facility];  Service: Orthopedics;  Laterality: Right;  Right total hip arthroplasty   TRANSTHORACIC ECHOCARDIOGRAM  05/08/2010   NORMAL LVSF/  EF 55%/  GRADE I DIASTOLIC DYSFUNCTION/  MILD BILATERAL ATRIUM ENLARGEMENT   Watchemn device  07/07/2022   Social History   Socioeconomic History   Marital status: Married    Spouse name: Dawn   Number of children: Not on file   Years of education: Not on file    Highest education level: Bachelor's degree (e.g., BA, AB, BS)  Occupational History   Occupation: retired  Tobacco Use   Smoking status: Former    Types: Cigars   Smokeless tobacco: Never   Tobacco comments:    AVERAGE 3 CIGARS PER WEEK  Vaping Use   Vaping status: Never Used  Substance and Sexual Activity   Alcohol use: Not Currently   Drug use: No   Sexual activity: Not Currently  Other Topics Concern   Not on file  Social History Narrative   Not on file   Social Drivers of Health   Financial Resource Strain: Low Risk  (11/20/2023)   Overall Financial Resource Strain (CARDIA)    Difficulty of Paying Living Expenses: Not hard at all  Food Insecurity: No Food Insecurity (11/20/2023)   Hunger Vital Sign    Worried About Running Out of Food in the Last Year: Never true    Ran Out of Food in the Last Year: Never true  Transportation Needs: No Transportation Needs (11/20/2023)   PRAPARE - Administrator, Civil Service (Medical): No  Lack of Transportation (Non-Medical): No  Physical Activity: Insufficiently Active (11/20/2023)   Exercise Vital Sign    Days of Exercise per Week: 3 days    Minutes of Exercise per Session: 40 min  Stress: Stress Concern Present (11/20/2023)   [employer] of Occupational Health - Occupational Stress Questionnaire    Feeling of Stress : To some extent  Social Connections: Moderately Integrated (11/20/2023)   Social Connection and Isolation Panel [NHANES]    Frequency of Communication with Friends and Family: Once a week    Frequency of Social Gatherings with Friends and Family: Once a week    Attends Religious Services: 1 to 4 times per year    Active Member of [employer] or Organizations: Yes    Attends Engineer, structural: 1 to 4 times per year    Marital Status: Married   Family History  Problem Relation Age of Onset   Diabetes Mother    Arthritis Mother    Rheum arthritis Mother    Dementia Mother    Heart disease  Father    Prostate cancer Father    Diabetes Sister    Diabetes Maternal Grandmother    Diabetes Maternal Grandfather    Bipolar disorder Brother    Diabetes Brother    Dementia Brother    Neuropathy Brother    Allergies  Allergen Reactions   Shellfish-Derived Products Other (See Comments)    Gout    Warfarin Sodium Rash   Current Outpatient Medications  Medication Sig Dispense Refill   acetaminophen  (TYLENOL ) 500 MG tablet Take 500-1,000 mg by mouth every 6 (six) hours as needed for moderate pain.     allopurinol  (ZYLOPRIM ) 300 MG tablet Take 1 tablet (300 mg total) by mouth daily. 90 tablet 3   aspirin  EC 81 MG tablet Take 1 tablet (81 mg total) by mouth daily. Swallow whole. 90 tablet 3   azithromycin  (ZITHROMAX ) 250 MG tablet Take 1 tablet (250 mg total) by mouth daily. 4 tablet 0   ciclopirox  (PENLAC ) 8 % solution Apply topically at bedtime. Apply over nail and surrounding skin. Apply daily over previous coat. After seven (7) days, may remove with alcohol and continue cycle. 6.6 mL 0   colchicine  0.6 MG tablet Take 1 tablet (0.6 mg total) by mouth daily as needed (gout). 90 tablet 1   diphenhydrAMINE  (ALLERGY RELIEF) 25 mg capsule Take 25 mg by mouth every 6 (six) hours as needed.     melatonin 5 MG TABS Take 5 mg by mouth at bedtime as needed (sleep).     metoprolol  succinate (TOPROL -XL) 25 MG 24 hr tablet TAKE 1 TABLET BY MOUTH DAILY 90 tablet 3   Multiple Vitamin (MULTIVITAMIN WITH MINERALS) TABS Take 1 tablet by mouth daily.     nitroGLYCERIN  (NITROSTAT ) 0.4 MG SL tablet Place 1 tablet (0.4 mg total) under the tongue every 5 (five) minutes as needed for chest pain. 45 tablet 3   Polyethylene Glycol 400 (BLINK TEARS) 0.25 % SOLN Place 1 drop into both eyes 2 (two) times daily as needed (for dryness or irritation).     predniSONE  (DELTASONE ) 10 MG tablet Take 2 tablets (20 mg total) by mouth 2 (two) times daily. 20 tablet 0   rosuvastatin  (CRESTOR ) 40 MG tablet Take 1 tablet  (40 mg total) by mouth daily. 90 tablet 3   sodium chloride  (OCEAN) 0.65 % SOLN nasal spray Place 1 spray into both nostrils as needed for congestion.     tadalafil (CIALIS) 5  MG tablet Take 5 mg by mouth daily as needed for erectile dysfunction.     trolamine salicylate (ASPERCREME) 10 % cream Apply 1 Application topically as needed for muscle pain (Heel).     No current facility-administered medications for this visit.   No results found.  Review of Systems:   A ROS was performed including pertinent positives and negatives as documented in the HPI.  Physical Exam :   Constitutional: NAD and appears stated age Neurological: Alert and oriented Psych: Appropriate affect and cooperative There were no vitals taken for this visit.   Comprehensive Musculoskeletal Exam:    Patient is seated in a wheelchair in exam room.  Tenderness with palpation in the lower lumbar spine and bilateral paraspinal musculature.  Lumbar ROM limited by both discomfort and positioning.  Knee flexion and extension, ankle dorsiflexion and plantarflexion, and EHL strength testing is 5/5.  Some decreased sensation noted in the lateral aspects of bilateral thighs and lower legs.  Imaging:   CT lumbar spine 02/15/2024:  IMPRESSION: 1. No acute fracture or malalignment of the lumbar spine. 2. Multilevel degenerative disc and facet disease resulting in multilevel canal stenosis and neuroforaminal narrowing as described above. Of note, there is severe canal stenosis at L3-4 and L4-5 with effacement of the lateral recess bilaterally and almost certain impingement of the crossing bilateral L4 and L5 nerve roots. 3. Nonobstructing left nephrolithiasis. 4. Mild lumbar dextroscoliosis, apex right at L2. 5. Stable 5 mm retrolisthesis L1-2 with degenerative ankylosis of the vertebral bodies and left facet joint at this level. 6. Additional multilevel degenerative disc and facet disease as described above. 7. Of note, there  is a rounded soft tissue density arising from the left facet at L5-S1, likely representing a synovial cyst and narrows the left neural foramen with possible abutment of the exiting left L5 nerve root.  Assessment:   78 y.o. male with history of known multilevel spinal stenosis presenting today with increased low back pain, radiculopathy, and associated gait instability.  Negative for cauda equina symptoms.  Patient has experienced multiple falls in the past few weeks.  Last MRI was completed in 2023, however patient did have a CT in the ED 4 days ago which did demonstrate severe spinal stenosis at L3-L4 and L4-L5.  An MRI was recommended and ordered in the ED, however patient has not been contacted about this.  Discussed plan today that I would like to have a stat MRI completed of the lumbar spine, with referral and close follow-up to Dr. Sulema Endo to evaluate and discuss further treatment plan.  Advised patient of developing red flag symptoms and when to seek emergent care.  Plan :    - Obtain stat lumbar MRI and referral to Dr. Sulema Endo for further evaluation and management     I personally saw and evaluated the patient, and participated in the management and treatment plan.  Sharrell Deck, PA-C Orthopedics

## 2024-02-19 NOTE — Telephone Encounter (Signed)
 New Message:     Patient is scheduled to have a MRI. He needs the make, the model and serial number for his Watchman please

## 2024-02-25 ENCOUNTER — Ambulatory Visit: Admitting: Physician Assistant

## 2024-02-25 ENCOUNTER — Telehealth (HOSPITAL_BASED_OUTPATIENT_CLINIC_OR_DEPARTMENT_OTHER): Payer: Self-pay | Admitting: Student

## 2024-02-25 ENCOUNTER — Other Ambulatory Visit (HOSPITAL_BASED_OUTPATIENT_CLINIC_OR_DEPARTMENT_OTHER): Payer: Self-pay | Admitting: Student

## 2024-02-25 ENCOUNTER — Ambulatory Visit
Admission: RE | Admit: 2024-02-25 | Discharge: 2024-02-25 | Disposition: A | Discharge status: Home / Self Care | Source: Ambulatory Visit | Attending: Student | Admitting: Student

## 2024-02-25 DIAGNOSIS — M5416 Radiculopathy, lumbar region: Secondary | ICD-10-CM | POA: Diagnosis not present

## 2024-02-25 DIAGNOSIS — M4316 Spondylolisthesis, lumbar region: Secondary | ICD-10-CM | POA: Diagnosis not present

## 2024-02-25 DIAGNOSIS — M48062 Spinal stenosis, lumbar region with neurogenic claudication: Secondary | ICD-10-CM

## 2024-02-25 DIAGNOSIS — M48061 Spinal stenosis, lumbar region without neurogenic claudication: Secondary | ICD-10-CM | POA: Diagnosis not present

## 2024-02-25 MED ORDER — TRAMADOL HCL 50 MG PO TABS: 50.0000 mg | ORAL_TABLET | Freq: Four times a day (QID) | ORAL | 0 refills | Status: AC | PRN

## 2024-02-25 NOTE — Telephone Encounter (Signed)
 I called and talked to patient. Patient did state the Tramadol  was ok to send. The [facility] [address] [facility].

## 2024-02-25 NOTE — Telephone Encounter (Signed)
 Patient states he needs pain meds for his back. Best contact [phone]

## 2024-03-02 ENCOUNTER — Ambulatory Visit (INDEPENDENT_AMBULATORY_CARE_PROVIDER_SITE_OTHER): Admitting: Orthopedic Surgery

## 2024-03-02 ENCOUNTER — Other Ambulatory Visit (INDEPENDENT_AMBULATORY_CARE_PROVIDER_SITE_OTHER): Payer: Self-pay

## 2024-03-02 VITALS — BP 119/82 | HR 85 | Ht 67.0 in | Wt 183.9 lb

## 2024-03-02 DIAGNOSIS — M48062 Spinal stenosis, lumbar region with neurogenic claudication: Secondary | ICD-10-CM

## 2024-03-02 MED ORDER — TRAMADOL HCL 50 MG PO TABS: 50.0000 mg | ORAL_TABLET | Freq: Four times a day (QID) | ORAL | 0 refills | Status: DC | PRN

## 2024-03-02 NOTE — Progress Notes (Signed)
 Orthopedic Spine Surgery Office Note  Assessment: Patient is a 78 y.o. male with low back pain that radiates into bilateral lower extremities along the posterior aspect of the thigh and legs.  Has stenosis at L3/4 and L4/5   Plan: -Explained that initially conservative treatment is tried as a significant number of patients may experience relief with these treatment modalities. Discussed that the conservative treatments include:  -activity modification  -physical therapy  -over the counter pain medications  -medrol  dosepak  -lumbar steroid injections -Patient has tried tylenol , prednisone , tramadol  -Recommended diagnostic and therapeutic injection.  Referral provided to him today -Provided him with a refill of tramadol  today -Spent some time with him and his wife going over symptoms.  His low back and radiating leg pain seem consistent with radiculopathy.  If his leg is giving out which is causing the balance issue that could go along with lumbar radiculopathy but I was unable to get a clear description of the issues with his legs during the history so that may be from cord level compression but he does not display any physical exam findings of myelopathy -Patient should return to office in 6 weeks, x-rays at next visit: none   Patient expressed understanding of the plan and all questions were answered to the patient's satisfaction.   ___________________________________________________________________________   History:  Patient is a 78 y.o. male who presents today for lumbar spine.  Patient has a history of chronic low back pain but it has gotten significantly worse recently.  He said that in May of this year he noticed acute worsening of his pain.  He feels in the low back and it radiates into the bilateral lower extremities.  He feels a going along the posterior aspect of his thighs and legs to the level of the feet.  He notes the pain with activity and at rest.  There is no trauma or  injury that preceded the onset of the pain.  He has had issues with walking since this started.  He has been using a walker.  He says his legs give out on him and do not feel like they hold him steady.  All of the lower extremities symptoms started at the same time.  Rates the pain as a 10 out of 10 at its worst.   Weakness: Yes, his legs feel weaker bilaterally Symptoms of imbalance: Yes, has had issues with balance.  Explains that he has had multiple falls and feels like his legs are giving out Difficulty with fine motor skills in the hand: Yes, has a history of trouble with fine motor skills in the hand like buttoning shirts due to residual deficits after a stroke.  Notes more issues with the left hand since that stroke Paresthesias and numbness: Yes, has numbness and paresthesias in his feet from neuropathy.  No recent changes Bowel or bladder incontinence: Denies Saddle anesthesia: Denies  Treatments tried: Tylenol , prednisone , tramadol   Review of systems: Denies fevers and chills, night sweats, unexplained weight loss, history of cancer.  Has had pain that wakes him at night  Past medical history:  Psoriasis CAD HLD History of MI History of stroke Atrial fibrillation Neuropathy Chronic pain  Allergies: warfarin  Past surgical history:  Bilateral hip replacements Appendectomy Coronary stent placement Carpal tunnel release Cystoscopy with stent placement Hernia repair Tonsillectomy  Social history: Denies use of nicotine product (smoking, vaping, patches, smokeless) Alcohol use: Rare Denies recreational drug use   Physical Exam:  BMI of 28.8  General: no  acute distress, appears stated age, sitting in wheelchair Neurologic: alert, answering questions appropriately, following commands Respiratory: unlabored breathing on room air, symmetric chest rise Psychiatric: appropriate affect, normal cadence to speech   MSK (spine)  -Strength  exam      Left  Right EHL    4/5  4/5 TA    5/5  5/5 GSC    5/5  5/5 Knee extension  5/5  5/5 Hip flexion   4/5  4/5  -Sensory exam    Sensation intact to light touch in L3-S1 nerve distributions of bilateral lower extremities  -Brachioradialis DTR: 1/4 bilaterally -Biceps DTR: 1/4 bilaterally -Patellar DTR: 1/4 bilaterally -Achilles DTR: 1/4 bilaterally -No beats of clonus bilaterally  -Negative Hoffmann bilaterally -No beats of clonus bilaterally -Negative grip and release test -No interosseous muscle wasting seen  -Seated straight leg raise: Negative bilaterally -Clonus: no beats bilaterally  -Left hip exam: No pain through range of motion -Right hip exam: No pain through range of motion  Imaging: XRs of the lumbar spine from 03/02/2024 were independently reviewed and interpreted, showing lumbar scoliotic curvature that measures 17 degrees with apex to the right.  Disc height loss at L1/2, L3/4 and L4/5.  No other significant degenerative changes.  No evidence of instability on flexion/extension views.  No fracture or dislocation seen.  MRI of the lumbar spine from 02/25/2024 was independently reviewed and interpreted, showing significant disc height loss with suspected autofusion at L1/2.  Central and lateral recess stenosis at L3/4.  Bilateral lateral recess stenosis at L4/5.   Patient name: [name] Short Patient MRN: [id number] Date of visit: 03/02/24

## 2024-03-08 ENCOUNTER — Other Ambulatory Visit: Payer: Self-pay | Admitting: Orthopedic Surgery

## 2024-03-11 ENCOUNTER — Telehealth: Payer: Self-pay | Admitting: Orthopedic Surgery

## 2024-03-11 NOTE — Telephone Encounter (Signed)
 Patient call and said can you send something to [facility] so they can get scheduled. They already have an appointment for Dr. Daisey Dryer but its a month out and they are trying to see who has the shorter wait. [phone]

## 2024-03-14 ENCOUNTER — Other Ambulatory Visit: Payer: Self-pay | Admitting: Radiology

## 2024-03-14 DIAGNOSIS — I480 Paroxysmal atrial fibrillation: Secondary | ICD-10-CM | POA: Diagnosis not present

## 2024-03-14 DIAGNOSIS — M48062 Spinal stenosis, lumbar region with neurogenic claudication: Secondary | ICD-10-CM

## 2024-03-14 NOTE — Telephone Encounter (Signed)
 Order placed

## 2024-03-15 ENCOUNTER — Other Ambulatory Visit: Payer: Self-pay | Admitting: *Deleted

## 2024-03-15 ENCOUNTER — Other Ambulatory Visit: Payer: Self-pay | Admitting: Orthopedic Surgery

## 2024-03-15 MED ORDER — METOPROLOL SUCCINATE ER 50 MG PO TB24: 50.0000 mg | ORAL_TABLET | Freq: Every day | ORAL | 3 refills | Status: AC

## 2024-03-15 NOTE — Progress Notes (Signed)
 Called and gave patient and spouse results and Dr. Alda Amas recommendations to increase Toprol  XL 50 mg daily. Understanding verbalized.

## 2024-03-18 DIAGNOSIS — N5201 Erectile dysfunction due to arterial insufficiency: Secondary | ICD-10-CM | POA: Diagnosis not present

## 2024-03-18 DIAGNOSIS — R3914 Feeling of incomplete bladder emptying: Secondary | ICD-10-CM | POA: Diagnosis not present

## 2024-03-18 DIAGNOSIS — N200 Calculus of kidney: Secondary | ICD-10-CM | POA: Diagnosis not present

## 2024-03-18 DIAGNOSIS — N401 Enlarged prostate with lower urinary tract symptoms: Secondary | ICD-10-CM | POA: Diagnosis not present

## 2024-03-18 NOTE — Discharge Instructions (Signed)
 Post Procedure Spinal Discharge Instruction Sheet  You may resume a regular diet and any medications that you routinely take (including pain medications) unless otherwise noted by MD.  No driving day of procedure.  Light activity throughout the rest of the day.  Do not do any strenuous work, exercise, bending or lifting.  The day following the procedure, you can resume normal physical activity but you should refrain from exercising or physical therapy for at least three days thereafter.  You may apply ice to the injection site, 20 minutes on, 20 minutes off, as needed. Do not apply ice directly to skin.    Common Side Effects:  Headaches- take your usual medications as directed by your physician.  Increase your fluid intake.  Caffeinated beverages may be helpful.  Lie flat in bed until your headache resolves.  Restlessness or inability to sleep- you may have trouble sleeping for the next few days.  Ask your referring physician if you need any medication for sleep.  Facial flushing or redness- should subside within a few days.  Increased pain- a temporary increase in pain a day or two following your procedure is not unusual.  Take your pain medication as prescribed by your referring physician.  Leg cramps  Please contact our office at [phone] for the following symptoms: Fever greater than 100 degrees. Headaches unresolved with medication after 2-3 days. Increased swelling, pain, or redness at injection site.   Thank you for visiting [facility] today.   YOU MAY RESUME YOUR ASPIRIN TODAY.

## 2024-03-21 ENCOUNTER — Ambulatory Visit
Admission: RE | Admit: 2024-03-21 | Discharge: 2024-03-21 | Disposition: A | Discharge status: Home / Self Care | Source: Ambulatory Visit | Attending: Orthopedic Surgery

## 2024-03-21 ENCOUNTER — Other Ambulatory Visit: Payer: Self-pay | Admitting: Orthopedic Surgery

## 2024-03-21 DIAGNOSIS — M5416 Radiculopathy, lumbar region: Secondary | ICD-10-CM | POA: Diagnosis not present

## 2024-03-21 DIAGNOSIS — M48062 Spinal stenosis, lumbar region with neurogenic claudication: Secondary | ICD-10-CM

## 2024-03-21 MED ORDER — METHYLPREDNISOLONE ACETATE 40 MG/ML INJ SUSP (RADIOLOG
80.0000 mg | Freq: Once | INTRAMUSCULAR | Status: AC
Start: 2024-03-21 — End: 2024-03-21
  Administered 2024-03-21: 80 mg via EPIDURAL

## 2024-03-21 MED ORDER — IOPAMIDOL (ISOVUE-M 200) INJECTION 41%
1.0000 mL | Freq: Once | INTRAMUSCULAR | Status: AC
Start: 2024-03-21 — End: 2024-03-21
  Administered 2024-03-21: 1 mL via EPIDURAL

## 2024-04-11 ENCOUNTER — Ambulatory Visit (INDEPENDENT_AMBULATORY_CARE_PROVIDER_SITE_OTHER): Admitting: Orthopedic Surgery

## 2024-04-11 ENCOUNTER — Encounter: Admitting: Physical Medicine and Rehabilitation

## 2024-04-11 DIAGNOSIS — R2689 Other abnormalities of gait and mobility: Secondary | ICD-10-CM | POA: Diagnosis not present

## 2024-04-11 NOTE — Addendum Note (Signed)
 Addended by: GEORGINA SHARPER on: 04/11/2024 09:09 PM   Modules accepted: Level of Service

## 2024-04-11 NOTE — Progress Notes (Signed)
 Orthopedic Spine Surgery Office Note   Assessment: Patient is a 78 y.o. male with low back pain that radiates into bilateral lower extremities along the posterior aspect of the thigh and legs.  Has stenosis at L3/4 and L4/5     Plan: -Patient has tried tylenol , prednisone , tramadol , lumbar steroid injection -Patient noticed significant improvement in his pain with the injection, so could consider that as a repeat treatment in the future -He still has some numbness and I explained that that does not always reliably get relief with injections or even surgery -He has some difficulty with balance but it is improved since getting injection.  Recommended and referred him to physical therapy for that -Patient should return to office in 7-8 weeks, x-rays at next visit: none     Patient expressed understanding of the plan and all questions were answered to the patient's satisfaction.    ___________________________________________________________________________     History:   Patient is a 78 y.o. male who presents today for follow up on his lumbar spine.  Patient is doing much better since he was last seen in the office.  He said his pain was 100% relieved with the injection that was done.  He still has some numbness in the same areas but it is also improved.  He is having some difficulty with balance but it is also improved.  To recap, he had been having pain going along the posterior aspect of his thighs and legs to the level of the feet.  He has continued to use a walker because he does not feel quite confident enough to get rid of it yet.  Treatments tried: Tylenol , prednisone , tramadol , lumbar steroid injection    Physical Exam:   General: no acute distress, appears stated age, sitting in wheelchair Neurologic: alert, answering questions appropriately, following commands Respiratory: unlabored breathing on room air, symmetric chest rise Psychiatric: appropriate affect, normal cadence to  speech     MSK (spine)   -Strength exam                                                   Left                  Right EHL                              4/5                  4/5 TA                                 5/5                  5/5 GSC                             5/5                  5/5 Knee extension            5/5                  5/5 Hip flexion  4/5                  4/5   -Sensory exam                           Sensation intact to light touch in L3-S1 nerve distributions of bilateral lower extremities   Imaging: XRs of the lumbar spine from 03/02/2024 were previously independently reviewed and interpreted, showing lumbar scoliotic curvature that measures 17 degrees with apex to the right.  Disc height loss at L1/2, L3/4 and L4/5.  No other significant degenerative changes.  No evidence of instability on flexion/extension views.  No fracture or dislocation seen.   MRI of the lumbar spine from 02/25/2024 was previously independently reviewed and interpreted, showing significant disc height loss with suspected autofusion at L1/2.  Central and lateral recess stenosis at L3/4.  Bilateral lateral recess stenosis at L4/5.     Patient name: [name] Short Patient MRN: [id number] Date of visit: 04/11/24

## 2024-05-04 ENCOUNTER — Ambulatory Visit: Attending: Orthopedic Surgery | Admitting: Physical Therapy

## 2024-05-04 ENCOUNTER — Other Ambulatory Visit: Payer: Self-pay

## 2024-05-04 ENCOUNTER — Encounter: Payer: Self-pay | Admitting: Physical Therapy

## 2024-05-04 DIAGNOSIS — R2681 Unsteadiness on feet: Secondary | ICD-10-CM | POA: Insufficient documentation

## 2024-05-04 DIAGNOSIS — R296 Repeated falls: Secondary | ICD-10-CM | POA: Diagnosis not present

## 2024-05-04 DIAGNOSIS — R2689 Other abnormalities of gait and mobility: Secondary | ICD-10-CM | POA: Diagnosis not present

## 2024-05-04 DIAGNOSIS — M6281 Muscle weakness (generalized): Secondary | ICD-10-CM | POA: Insufficient documentation

## 2024-05-04 DIAGNOSIS — R262 Difficulty in walking, not elsewhere classified: Secondary | ICD-10-CM | POA: Insufficient documentation

## 2024-05-04 NOTE — Therapy (Signed)
 OUTPATIENT PHYSICAL THERAPY LOWER EXTREMITY EVALUATION   Patient Name: [name] Short MRN: [id number] DOB:[birth year], 78 y.o., male Today's Date: 05/04/2024  END OF SESSION:  PT End of Session - 05/04/24 1441     Visit Number 1    Number of Visits 17    Date for PT Re-Evaluation 06/29/24    Authorization Type MCR and BCBS    Authorization Time Period 05/04/24 to 06/29/24    Progress Note Due on Visit 10    PT Start Time 1440   late pt arrival/check in   PT Stop Time 1510    PT Time Calculation (min) 30 min    Activity Tolerance Patient tolerated treatment well    Behavior During Therapy WFL for tasks assessed/performed          Past Medical History:  Diagnosis Date   Arthritis    WRIST   Benign localized prostatic hyperplasia with lower urinary tract symptoms (LUTS)    Complication of anesthesia    slow to wake   ED (erectile dysfunction)    GERD (gastroesophageal reflux disease)    History of concussion    AS CHILD--  NO RESIDUAL   History of gout    History of kidney stones    Hyperlipidemia    Inguinal hernia, bilateral    Nephrolithiasis    BILATERAL   PAF (paroxysmal atrial fibrillation) (HCC) currently being followed by pcp   EPISODE --  2011  FOLLOW-UP W/ DR WALL  /  HAS NOT SEEN ANY CARDIOLOGIST SINCE   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 Cindie   Past Surgical History:  Procedure Laterality Date   APPENDECTOMY  1983   CARDIOVASCULAR STRESS TEST  05-08-2010  DR WALL   NO EVIDENCE OF SCAR OR ISCHEMIA/ EF 54%/  PT HAD BOTH AFIB/ AFLUTTER DURING STUDY   CARPAL TUNNEL RELEASE Right 01/ 14/ 2021   CORONARY STENT INTERVENTION N/A 05/02/2022   Procedure: CORONARY STENT INTERVENTION;  Surgeon: Verlin Lonni BIRCH, MD;  Location: [facility];  Service: Cardiovascular;  Laterality: N/A;   CORONARY/GRAFT ACUTE MI REVASCULARIZATION N/A 05/01/2022   Procedure: Coronary/Graft Acute MI  Revascularization;  Surgeon: Verlin Lonni BIRCH, MD;  Location: [facility];  Service: Cardiovascular;  Laterality: N/A;   CYSTOSCOPY W/ URETERAL STENT PLACEMENT Left 09/20/2013   Procedure: CYSTOSCOPY WITH STENT REPLACEMENT;  Surgeon: Donnice Gwenyth Brooks, MD;  Location: [facility];  Service: Urology;  Laterality: Left;   CYSTOSCOPY WITH URETEROSCOPY Left 09/20/2013   Procedure: CYSTOSCOPY WITH URETEROSCOPY;  Surgeon: Donnice Gwenyth Brooks, MD;  Location: [facility];  Service: Urology;  Laterality: Left;   CYSTOSCOPY WITH URETEROSCOPY AND STENT PLACEMENT Left 07/12/2013   Procedure: CYSTOSCOPY WITH LITHOLAPEXY, LEFT URETERAL STENT PLACEMENT;  Surgeon: Donnice Gwenyth Brooks, MD;  Location: [facility];  Service: Urology;  Laterality: Left;   CYSTOSCOPY WITH URETEROSCOPY AND STENT PLACEMENT Bilateral 06/23/2014   Procedure: BILATERAL URETEROSCOPY, HOLMIUM LASER LITHOTRIPSY AND STENT PLACEMENT, RIGHT ;  Surgeon: Donnice Brooks, MD;  Location: [facility];  Service: Urology;  Laterality: Bilateral;   EXTRACORPOREAL SHOCK WAVE LITHOTRIPSY  X2   EXTRACORPOREAL SHOCK WAVE LITHOTRIPSY Left 08-29-2013;   07-28-2013   EXTRACORPOREAL SHOCK WAVE LITHOTRIPSY Right 12/09/2018   Procedure: EXTRACORPOREAL SHOCK WAVE LITHOTRIPSY (ESWL);  Surgeon: Brooks Donnice, MD;  Location: [facility];  Service: Urology;  Laterality: Right;   HOLMIUM LASER APPLICATION Left 09/20/2013  Procedure: HOLMIUM LASER APPLICATION;  Surgeon: Donnice Gwenyth Brooks, MD;  Location: [facility];  Service: Urology;  Laterality: Left;   INGUINAL HERNIA REPAIR Bilateral 10/16/2020   Procedure: BILATERAL OPEN INGUINAL HERNIA REPAIR WITH MESH;  Surgeon: Vernetta Berg, MD;  Location: [facility];  Service: General;  Laterality: Bilateral;  LMA/TAP BLOCK   LAPAROSCOPIC INGUINAL HERNIA REPAIR Bilateral 09/04/2010   w/ mesh   LEFT  ATRIAL APPENDAGE OCCLUSION N/A 07/17/2022   Procedure: LEFT ATRIAL APPENDAGE OCCLUSION;  Surgeon: Cindie Ole DASEN, MD;  Location: [facility];  Service: Cardiovascular;  Laterality: N/A;   LEFT HEART CATH AND CORONARY ANGIOGRAPHY N/A 05/01/2022   Procedure: LEFT HEART CATH AND CORONARY ANGIOGRAPHY;  Surgeon: Verlin Lonni BIRCH, MD;  Location: [facility];  Service: Cardiovascular;  Laterality: N/A;   TEE WITHOUT CARDIOVERSION N/A 07/17/2022   Procedure: TRANSESOPHAGEAL ECHOCARDIOGRAM (TEE);  Surgeon: Cindie Ole DASEN, MD;  Location: [facility];  Service: Cardiovascular;  Laterality: N/A;   TONSILLECTOMY  AS CHILD   TOTAL HIP ARTHROPLASTY Left 05/11/2002   TOTAL HIP ARTHROPLASTY Right 11/15/2012   Procedure: RIGHT TOTAL HIP ARTHROPLASTY ANTERIOR APPROACH;  Surgeon: Oneil JAYSON Herald, MD;  Location: [facility];  Service: Orthopedics;  Laterality: Right;  Right total hip arthroplasty   TRANSTHORACIC ECHOCARDIOGRAM  05/08/2010   NORMAL LVSF/  EF 55%/  GRADE I DIASTOLIC DYSFUNCTION/  MILD BILATERAL ATRIUM ENLARGEMENT   Watchemn device  07/07/2022   Patient Active Problem List   Diagnosis Date Noted   Posterior tibial tendonitis 08/06/2022   Presence of Watchman left atrial appendage closure device 07/17/2022   Spinal stenosis of lumbar region 06/04/2022   Hematuria 05/16/2022   Coronary artery disease involving native coronary artery of native heart with unstable angina pectoris (HCC)    STEMI (ST elevation myocardial infarction) (HCC) 05/01/2022   Acute ST elevation myocardial infarction (STEMI) of inferior wall (HCC)    Recurrent kidney stones 08/14/2021   History of cerebrovascular accident (CVA) with residual deficit 02/11/2021   CVA (cerebral vascular accident) (HCC) 10/21/2020   Bilateral primary osteoarthritis of knee 02/15/2020   Acquired trigger finger of right little finger 11/03/2019   Encounter for orthopedic follow-up care 10/13/2019   H/O total hip  arthroplasty, bilateral 10/24/2018   Gout 08/18/2017   Overweight (BMI 25.0-29.9) 01/23/2017   Foot pain, left 02/11/2016   Sebaceous cyst 07/14/2014   Sun-damaged skin 07/14/2014   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/14/2014   Physical exam, annual 05/17/2012   Inguinal hernia 08/12/2010   HYPERLIPIDEMIA TYPE I / IV 05/01/2010   Paroxysmal a-fib (HCC) 05/01/2010   ABNORMAL ELECTROCARDIOGRAM 03/26/2010    PCP: Mahlon Crank MD   REFERRING PROVIDER: Georgina Ozell LABOR, MD  REFERRING DIAG:  Diagnosis  R26.89 (ICD-10-CM) - Imbalance    THERAPY DIAG:  Unsteadiness on feet  Repeated falls  Muscle weakness (generalized)  Difficulty in walking, not elsewhere classified  Rationale for Evaluation and Treatment: Rehabilitation  ONSET DATE: early May 2025  SUBJECTIVE:   SUBJECTIVE STATEMENT:  Had been to the beach in early may with tennis and dancing and what not, fell on May 9th and then again when using just a cane. This progressed to not being able to walk period, had a lot of back pain and went the ED, got pain medicine and tx for bronchitis. Eventually ended up getting MRI which led to lumbar injections, this found that there was a disc and nerves being pinched or something. Injections helped  a lot but now my main concern is imbalance that has been ongoing but slowly started getting better in past 2 weeks.   PERTINENT HISTORY:  See above  PAIN:  Are you having pain? Yes: NPRS scale: 1/10 Pain location: lower back  Pain description: aggravating but not crippling  Aggravating factors: standing for too long in one place, leaning over with something weight wise in my hands  Relieving factors: injections   PRECAUTIONS: Fall  RED FLAGS: None   WEIGHT BEARING RESTRICTIONS: No  FALLS:  Has patient fallen in last 6 months? Yes. 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  DIAGNOSTIC FINDINGS:   CLINICAL DATA:  78 year old male with low back pain radiating down both legs since 02/04/2024. multiple falls.   EXAM: MRI LUMBAR SPINE WITHOUT CONTRAST   TECHNIQUE: Multiplanar, multisequence MR imaging of the lumbar spine was performed. No intravenous contrast was administered.   COMPARISON:  Lumbar spine CT 02/15/2024.  Lumbar MRI 05/13/2022.   FINDINGS: Segmentation: Normal on the comparison, the same numbering system used on the previous MRI.   Alignment: Stable since 2023. Subtle chronic grade 1 anterolisthesis of L3 on L4, mild at L5-S1. Underlying mild S-shaped lumbar scoliosis.   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 New degenerative anterior endplate marrow edema at T12-L1 (series 105, image 8).   Normal background bone marrow signal and maintained vertebral height. Intact visible sacrum and SI joints. No other acute osseous abnormality.   Conus medullaris and cauda equina: Conus extends to the L1 level. No lower spinal cord or conus signal abnormality.   Paraspinal and other soft tissues: Stable, negative Visualized abdominal viscera and paraspinal soft tissues.   Partially visible distended urinary bladder (series 105, image 4).   Disc levels:   Compared to the 2023 MRI:   T11-T12: Stable mild facet hypertrophy on the right without stenosis.   T12-L1: Progressed anterior disc  space loss and new vacuum disc (pronounced vacuum phenomena on the recent CT). Stable mild disc bulging without stenosis.   L1-L2: Interbody ankylosis now. Endplate spurring. Moderate chronic left L1 foraminal stenosis. No spinal stenosis.   L2-L3: Minor disc bulging. Stable mild facet and ligament flavum hypertrophy. No stenosis.   L3-L4: Progressed and now moderate to severe spinal and right lateral recess (right L4 nerve level) stenosis since 2023 in the setting of mild chronic anterolisthesis. Asymmetric disc/pseudo disc to the right with severe ligament flavum hypertrophy. Moderate to severe facet hypertrophy. Decreased facet joint fluid at this level. Moderate left L3 foraminal stenosis also appears mildly progressed.   L4-L5: Chronic disc space loss with increased circumferential disc bulge asymmetric to the right, moderate facet and ligament flavum hypertrophy. Increased mild spinal and moderate right lateral recess stenosis (right L5 nerve level series 106 image 33). Moderate right L4 neural foraminal stenosis also appears increased.   L5-S1: Stable mild disc/pseudo  disc bulging. Moderate chronic facet degeneration and degenerative facet joint fluid. The left foraminal synovial cyst is less apparent on series 106, image 33. No convincing stenosis now.   IMPRESSION: 1. 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 - progressed and now moderate to severe multifactorial spinal and right lateral recess stenosis at L3-L4. Progressed moderate left foraminal stenosis. - progressed mild spinal and moderate multifactorial right lateral recess and right foraminal stenosis at L4-L5.    2. Chronic L5-S1 facet arthropathy, but a left foraminal synovial cysts there seems regressed.   3. Distended urinary bladder.  Query urinary retention.   XRs of the lumbar spine from 03/02/2024 were independently reviewed and  interpreted, showing lumbar scoliotic curvature that measures 17 degrees  with apex to the right.  Disc height loss at L3/4 and L4/5.  No other  significant degenerative changes.  No evidence of instability on  flexion/extension views.  No fracture or dislocation seen.      PATIENT SURVEYS:  PSFS: THE PATIENT SPECIFIC FUNCTIONAL SCALE  Place score of 0-10 (0 = unable to perform activity and 10 = able to perform activity at the same level as before injury or problem)  Activity Date: 05/04/24    Walking with no device  4    2. Standing for a long time (>45 minutes) 2    3. Balance/steadiness in general  3    4.      Total Score 3      Total Score = Sum of activity scores/number of activities  Minimally Detectable Change: 3 points (for single activity); 2 points (for average score)  [facility] (nd). The Patient Specific Functional Scale . Retrieved from [email or web address]   COGNITION: Overall cognitive status: Within functional limits for tasks assessed        LOWER EXTREMITY MMT:  MMT Right eval Left eval  Hip flexion 4 4  Hip extension    Hip abduction 4- seated  4- seated   Hip adduction    Hip internal rotation    Hip external rotation    Knee flexion 4+ 4+  Knee extension 4+ 4+  Ankle dorsiflexion 4+ 4+  Ankle plantarflexion    Ankle inversion    Ankle eversion     (Blank rows = not tested)    FUNCTIONAL TESTS:  5 times sit to stand: 18.5 seconds no UEs  Timed up and go (TUG): 21 seconds no device  3 minute walk test: 437ft no device min guard   GAIT: Distance walked: 475ft Assistive device utilized: None Level of assistance: CGA Comments: more steady at  first, got a lot more unsteady as fatigued increased, did need light MinA on turns towards end of test  TREATMENT DATE:   05/04/24  Eval, POC     PATIENT EDUCATION:  Education details: exam findings, POC Person educated: Patient Education method: Explanation, Demonstration, and Handouts Education comprehension: verbalized understanding, returned demonstration, and needs further education  HOME EXERCISE PROGRAM: TBD   ASSESSMENT:  CLINICAL IMPRESSION: Patient is a 78 y.o. M who was seen today for physical therapy evaluation and treatment for  Diagnosis  R26.89 (ICD-10-CM) - Imbalance  . Arrived late to eval, very pleasant and cooperative but very talkative to so exam was very limited. Will make every effort to address functional concerns and reduce fall risk moving forward.   OBJECTIVE IMPAIRMENTS: Abnormal gait, decreased activity tolerance, decreased balance, decreased coordination, decreased knowledge of use of DME, decreased mobility, difficulty walking, decreased strength, and pain.   ACTIVITY LIMITATIONS: standing, squatting, stairs, transfers, and locomotion level  PARTICIPATION LIMITATIONS: driving, community activity, occupation, and yard work  PERSONAL FACTORS: Age, Behavior pattern, Fitness, Past/current experiences, Social background, and Time since onset of injury/illness/exacerbation are also affecting patient's functional outcome.   REHAB POTENTIAL: Good  CLINICAL DECISION MAKING: Stable/uncomplicated  EVALUATION COMPLEXITY: Low   GOALS: Goals reviewed with patient? No  SHORT TERM GOALS: Target date: 06/01/2024    Will be compliant with appropriate progressive HEP  Baseline: Goal status: INITIAL  2.  Will be able to name 3 ways to reduce fall risk at home and in community  Baseline:  Goal status: INITIAL  3.  Will complete  TUG in 15 seconds or less no device  Baseline:  Goal status: INITIAL  4.  Will complete 5xSTS in 15 seconds no UEs  Baseline:  Goal status: INITIAL    LONG TERM GOALS: Target date: 06/29/2024    MMT to have improved by at least 1 grade in all weak groups  Baseline:  Goal status: INITIAL  2.  Will score at least 18 on DGI  Baseline:  Goal status: INITIAL  3.  Will able to ambulate in community with no more than baseline LRAD as desired without difficulty  Baseline:  Goal status: INITIAL  4.  Will be able to perform standing tasks for longer than 1 hour Baseline:  Goal status: INITIAL  5.  PSFS to have improved by at least 3 points  Baseline:  Goal status: INITIAL     PLAN:  PT FREQUENCY: 2x/week  PT DURATION: 8 weeks  PLANNED INTERVENTIONS: 97750- Physical Performance Testing, 97110-Therapeutic exercises, 97530- Therapeutic activity, [id number]- Neuromuscular re-education, 97535- Self Care, [id number]- Manual therapy, [id number]- Gait training, and DME instructions  PLAN FOR NEXT SESSION: balance, functional activity tolerance, strength- still needs initial HEP   Josette Rough, PT, DPT 05/04/24 3:26 PM

## 2024-05-09 ENCOUNTER — Other Ambulatory Visit: Payer: Self-pay

## 2024-05-09 ENCOUNTER — Ambulatory Visit

## 2024-05-09 DIAGNOSIS — R262 Difficulty in walking, not elsewhere classified: Secondary | ICD-10-CM | POA: Diagnosis not present

## 2024-05-09 DIAGNOSIS — R296 Repeated falls: Secondary | ICD-10-CM | POA: Diagnosis not present

## 2024-05-09 DIAGNOSIS — R2681 Unsteadiness on feet: Secondary | ICD-10-CM

## 2024-05-09 DIAGNOSIS — R2689 Other abnormalities of gait and mobility: Secondary | ICD-10-CM | POA: Diagnosis not present

## 2024-05-09 DIAGNOSIS — M6281 Muscle weakness (generalized): Secondary | ICD-10-CM

## 2024-05-09 NOTE — Therapy (Signed)
 OUTPATIENT PHYSICAL THERAPY LOWER EXTREMITY TREATMENT   Patient Name: [name] Short MRN: [id number] DOB:[birth year], 78 y.o., male Today's Date: 05/09/2024  END OF SESSION:  PT End of Session - 05/09/24 1102     Visit Number 2    Date for PT Re-Evaluation 06/29/24    Authorization Type MCR and BCBS    Authorization Time Period 05/04/24 to 06/29/24    Progress Note Due on Visit 10    PT Start Time 1102    PT Stop Time 1145    PT Time Calculation (min) 43 min    Activity Tolerance Patient tolerated treatment well    Behavior During Therapy WFL for tasks assessed/performed           Past Medical History:  Diagnosis Date   Arthritis    WRIST   Benign localized prostatic hyperplasia with lower urinary tract symptoms (LUTS)    Complication of anesthesia    slow to wake   ED (erectile dysfunction)    GERD (gastroesophageal reflux disease)    History of concussion    AS CHILD--  NO RESIDUAL   History of gout    History of kidney stones    Hyperlipidemia    Inguinal hernia, bilateral    Nephrolithiasis    BILATERAL   PAF (paroxysmal atrial fibrillation) (HCC) currently being followed by pcp   EPISODE --  2011  FOLLOW-UP W/ DR WALL  /  HAS NOT SEEN ANY CARDIOLOGIST SINCE   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 Cindie   Past Surgical History:  Procedure Laterality Date   APPENDECTOMY  1983   CARDIOVASCULAR STRESS TEST  05-08-2010  DR WALL   NO EVIDENCE OF SCAR OR ISCHEMIA/ EF 54%/  PT HAD BOTH AFIB/ AFLUTTER DURING STUDY   CARPAL TUNNEL RELEASE Right 01/ 14/ 2021   CORONARY STENT INTERVENTION N/A 05/02/2022   Procedure: CORONARY STENT INTERVENTION;  Surgeon: Verlin Lonni BIRCH, MD;  Location: [facility];  Service: Cardiovascular;  Laterality: N/A;   CORONARY/GRAFT ACUTE MI REVASCULARIZATION N/A 05/01/2022   Procedure: Coronary/Graft Acute MI Revascularization;  Surgeon: Verlin Lonni BIRCH, MD;  Location: [facility];  Service: Cardiovascular;  Laterality: N/A;   CYSTOSCOPY W/ URETERAL STENT PLACEMENT Left 09/20/2013   Procedure: CYSTOSCOPY WITH STENT REPLACEMENT;  Surgeon: Donnice Gwenyth Brooks, MD;  Location: [facility];  Service: Urology;  Laterality: Left;   CYSTOSCOPY WITH URETEROSCOPY Left 09/20/2013   Procedure: CYSTOSCOPY WITH URETEROSCOPY;  Surgeon: Donnice Gwenyth Brooks, MD;  [facility];  Service: Urology;  Laterality: Left;   CYSTOSCOPY WITH URETEROSCOPY AND STENT PLACEMENT Left 07/12/2013   Procedure: CYSTOSCOPY WITH LITHOLAPEXY, LEFT URETERAL STENT PLACEMENT;  Surgeon: Donnice Gwenyth Brooks, MD;  Location: [facility];  Service: Urology;  Laterality: Left;   CYSTOSCOPY WITH URETEROSCOPY AND STENT PLACEMENT Bilateral 06/23/2014   Procedure: BILATERAL URETEROSCOPY, HOLMIUM LASER LITHOTRIPSY AND STENT PLACEMENT, RIGHT ;  Surgeon: Donnice Brooks, MD;  Location: [facility];  Service: Urology;  Laterality: Bilateral;   EXTRACORPOREAL SHOCK WAVE LITHOTRIPSY  X2   EXTRACORPOREAL SHOCK WAVE LITHOTRIPSY Left 08-29-2013;   07-28-2013   EXTRACORPOREAL SHOCK WAVE LITHOTRIPSY Right 12/09/2018   Procedure: EXTRACORPOREAL SHOCK WAVE LITHOTRIPSY (ESWL);  Surgeon: Brooks Donnice, MD;  Location: [facility];  Service: Urology;  Laterality: Right;   HOLMIUM LASER APPLICATION Left 09/20/2013   Procedure: HOLMIUM LASER APPLICATION;  Surgeon: Donnice Gwenyth Brooks, MD;  Location: [facility];  Service: Urology;  Laterality: Left;   INGUINAL HERNIA REPAIR Bilateral 10/16/2020   Procedure: BILATERAL OPEN INGUINAL HERNIA REPAIR WITH MESH;  Surgeon: Vernetta Berg, MD;  Location: [facility];  Service: General;  Laterality: Bilateral;  LMA/TAP BLOCK   LAPAROSCOPIC INGUINAL HERNIA REPAIR Bilateral 09/04/2010   w/ mesh   LEFT ATRIAL APPENDAGE OCCLUSION N/A 07/17/2022    Procedure: LEFT ATRIAL APPENDAGE OCCLUSION;  Surgeon: Cindie Ole DASEN, MD;  Location: [facility];  Service: Cardiovascular;  Laterality: N/A;   LEFT HEART CATH AND CORONARY ANGIOGRAPHY N/A 05/01/2022   Procedure: LEFT HEART CATH AND CORONARY ANGIOGRAPHY;  Surgeon: Verlin Lonni BIRCH, MD;  Location: [facility];  Service: Cardiovascular;  Laterality: N/A;   TEE WITHOUT CARDIOVERSION N/A 07/17/2022   Procedure: TRANSESOPHAGEAL ECHOCARDIOGRAM (TEE);  Surgeon: Cindie Ole DASEN, MD;  Location: [facility];  Service: Cardiovascular;  Laterality: N/A;   TONSILLECTOMY  AS CHILD   TOTAL HIP ARTHROPLASTY Left 05/11/2002   TOTAL HIP ARTHROPLASTY Right 11/15/2012   Procedure: RIGHT TOTAL HIP ARTHROPLASTY ANTERIOR APPROACH;  Surgeon: Oneil JAYSON Herald, MD;  Location: [facility];  Service: Orthopedics;  Laterality: Right;  Right total hip arthroplasty   TRANSTHORACIC ECHOCARDIOGRAM  05/08/2010   NORMAL LVSF/  EF 55%/  GRADE I DIASTOLIC DYSFUNCTION/  MILD BILATERAL ATRIUM ENLARGEMENT   Watchemn device  07/07/2022   Patient Active Problem List   Diagnosis Date Noted   Posterior tibial tendonitis 08/06/2022   Presence of Watchman left atrial appendage closure device 07/17/2022   Spinal stenosis of lumbar region 06/04/2022   Hematuria 05/16/2022   Coronary artery disease involving native coronary artery of native heart with unstable angina pectoris (HCC)    STEMI (ST elevation myocardial infarction) (HCC) 05/01/2022   Acute ST elevation myocardial infarction (STEMI) of inferior wall (HCC)    Recurrent kidney stones 08/14/2021   History of cerebrovascular accident (CVA) with residual deficit 02/11/2021   CVA (cerebral vascular accident) (HCC) 10/21/2020   Bilateral primary osteoarthritis of knee 02/15/2020   Acquired trigger finger of right little finger 11/03/2019   Encounter for orthopedic follow-up care 10/13/2019   H/O total hip arthroplasty, bilateral 10/24/2018   Gout 08/18/2017    Overweight (BMI 25.0-29.9) 01/23/2017   Foot pain, left 02/11/2016   Sebaceous cyst 07/14/2014   Sun-damaged skin 07/14/2014   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/14/2014   Physical exam, annual 05/17/2012   Inguinal hernia 08/12/2010   HYPERLIPIDEMIA TYPE I / IV 05/01/2010   Paroxysmal a-fib (HCC) 05/01/2010   ABNORMAL ELECTROCARDIOGRAM 03/26/2010    PCP: Mahlon Crank MD   REFERRING PROVIDER: Georgina Ozell LABOR, MD  REFERRING DIAG:  Diagnosis  R26.89 (ICD-10-CM) - Imbalance    THERAPY DIAG:  Unsteadiness on feet  Repeated falls  Muscle weakness (generalized)  Difficulty in walking, not elsewhere classified  Rationale for Evaluation and Treatment: Rehabilitation  ONSET DATE: early May 2025  SUBJECTIVE:   SUBJECTIVE STATEMENT:05/09/24:I've been walking a little inside with st cane to try to build my tolerance. I have lost about 20# since all of this stuff happened, havent been able to eat.   Eval:Had been to the beach in early may with tennis and dancing and what not, fell on May 9th and then again when using just a cane. This progressed to not being able to walk period, had a lot of back pain and went the ED, got pain medicine and tx for bronchitis. Eventually ended up getting MRI  which led to lumbar injections, this found that there was a disc and nerves being pinched or something. Injections helped a lot but now my main concern is imbalance that has been ongoing but slowly started getting better in past 2 weeks.   PERTINENT HISTORY:  See above  PAIN:  Are you having pain? Yes: NPRS scale: 1/10 Pain location: lower back  Pain description: aggravating but not crippling  Aggravating factors: standing for too long in one place, leaning over with something weight wise in my hands  Relieving factors: injections   PRECAUTIONS: Fall  RED FLAGS: None   WEIGHT BEARING RESTRICTIONS: No  FALLS:  Has patient fallen in last 6 months? Yes. 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  DIAGNOSTIC FINDINGS:   CLINICAL DATA:  78 year old male with low back pain radiating down both legs since 02/04/2024. multiple falls.   EXAM: MRI LUMBAR SPINE WITHOUT CONTRAST   TECHNIQUE: Multiplanar, multisequence MR imaging of the lumbar spine was performed. No intravenous contrast was administered.   COMPARISON:  Lumbar spine CT 02/15/2024.  Lumbar MRI 05/13/2022.   FINDINGS: Segmentation: Normal on the comparison, the same numbering system used on the previous MRI.   Alignment: Stable since 2023. Subtle chronic grade 1 anterolisthesis of L3 on L4, mild at L5-S1. Underlying mild S-shaped lumbar scoliosis.   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 New degenerative anterior endplate marrow edema at T12-L1 (series 105, image 8).   Normal background bone marrow signal and maintained vertebral height. Intact visible sacrum and SI joints. No other acute osseous abnormality.   Conus medullaris and cauda equina: Conus extends to the L1 level. No lower spinal cord or conus signal abnormality.   Paraspinal and other soft tissues: Stable, negative Visualized abdominal viscera and paraspinal soft tissues.   Partially visible distended urinary bladder (series 105, image 4).   Disc levels:   Compared to the 2023  MRI:   T11-T12: Stable mild facet hypertrophy on the right without stenosis.   T12-L1: Progressed anterior disc space loss and new vacuum disc (pronounced vacuum phenomena on the recent CT). Stable mild disc bulging without stenosis.   L1-L2: Interbody ankylosis now. Endplate spurring. Moderate chronic left L1 foraminal stenosis. No spinal stenosis.   L2-L3: Minor disc bulging. Stable mild facet and ligament flavum hypertrophy. No stenosis.   L3-L4: Progressed and now moderate to severe spinal and right lateral recess (right L4 nerve level) stenosis since 2023 in the setting of mild chronic anterolisthesis. Asymmetric disc/pseudo disc to the right with severe ligament flavum hypertrophy. Moderate to severe facet hypertrophy. Decreased facet joint fluid at this level. Moderate left L3 foraminal stenosis also appears mildly progressed.   L4-L5: Chronic disc space loss with increased circumferential disc bulge asymmetric to the right, moderate facet and ligament flavum hypertrophy. Increased mild spinal and moderate right lateral recess stenosis (right L5 nerve  level series 106 image 33). Moderate right L4 neural foraminal stenosis also appears increased.   L5-S1: Stable mild disc/pseudo disc bulging. Moderate chronic facet degeneration and degenerative facet joint fluid. The left foraminal synovial cyst is less apparent on series 106, image 33. No convincing stenosis now.   IMPRESSION: 1. 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 - progressed and now moderate to severe multifactorial spinal and right lateral recess stenosis at L3-L4. Progressed moderate left foraminal stenosis. -  progressed mild spinal and moderate multifactorial right lateral recess and right foraminal stenosis at L4-L5.   2. Chronic L5-S1 facet arthropathy, but a left foraminal synovial cysts there seems regressed.   3. Distended urinary bladder.  Query urinary retention.   XRs of the lumbar spine from 03/02/2024 were independently reviewed and  interpreted, showing lumbar scoliotic curvature that measures 17 degrees  with apex to the right.  Disc height loss at L3/4 and L4/5.  No other  significant degenerative changes.  No evidence of instability on  flexion/extension views.  No fracture or dislocation seen.      PATIENT SURVEYS:  PSFS: THE PATIENT SPECIFIC FUNCTIONAL SCALE  Place score of 0-10 (0 = unable to perform activity and 10 = able to perform activity at the same level as before injury or problem)  Activity Date: 05/04/24    Walking with no device  4    2. Standing for a long time (>45 minutes) 2    3. Balance/steadiness in general  3    4.      Total Score 3      Total Score = Sum of activity scores/number of activities  Minimally Detectable Change: 3 points (for single activity); 2 points (for average score)  [facility] (nd). The Patient Specific Functional Scale . Retrieved from [email or web address]   COGNITION: Overall cognitive status: Within functional limits for tasks assessed        LOWER EXTREMITY MMT:  MMT Right eval Left eval  Hip flexion 4 4  Hip extension    Hip abduction 4- seated  4- seated   Hip adduction    Hip internal rotation    Hip external rotation    Knee flexion 4+ 4+  Knee extension 4+ 4+  Ankle dorsiflexion 4+ 4+  Ankle plantarflexion    Ankle inversion    Ankle eversion     (Blank rows = not tested)    FUNCTIONAL TESTS:  5 times sit to stand: 18.5 seconds no UEs  Timed up and go (TUG): 21 seconds no device  3 minute walk test: 499ft no device min guard    GAIT: Distance walked: 447ft Assistive device utilized: None Level of assistance: CGA Comments: more steady at first, got a lot more unsteady as fatigued increased, did need light MinA on turns towards end of test  TREATMENT DATE:  05/09/24:  IN ll bars, heel/ toe rocks on airex In ll bars side stepping with green band around thighs. 6 reps Sit to stand elevated mat 10 reps, no UE support Sit to stand elevated mat with yellow mediball punches 10 reps In ll bars staggered position, lead foot on 8 step, static standing, then horizontal head turns 15x each foot Nustep level 5 x 6 min x 4 ext Knee ext B 10#, 2 x 15 Lateral heel taps, 15x each foot, in ll bars from 2 block    05/04/24  Eval, POC     PATIENT EDUCATION:  Education details: exam findings, POC Person educated: Patient Education method: Programmer, multimedia, Demonstration, and Handouts Education comprehension: verbalized understanding, returned demonstration, and needs further education  HOME EXERCISE PROGRAM: TBD   ASSESSMENT:  CLINICAL IMPRESSION: 05/09/24:  the patient returned today for his first physical therapy intervention following his evaluation.  Tolerated a full session of balance and strength training, no c/o back pain during session.  Noted loss of balance when descending the ramp in ll bars, so at end of session instructed him in eccentric training for this movement. He should benefit from ongoing strengthening and balance training to improve his activity tolerance and function.    Patient is a 78 y.o. M who was seen today for physical therapy evaluation and treatment for  Diagnosis  R26.89 (ICD-10-CM) - Imbalance  . Arrived late to eval, very pleasant and cooperative but very talkative to so exam was very limited. Will make every effort to address functional concerns and reduce fall  risk moving forward.   OBJECTIVE IMPAIRMENTS: Abnormal gait, decreased activity tolerance, decreased balance, decreased coordination, decreased knowledge of use of DME, decreased mobility, difficulty walking, decreased strength, and pain.   ACTIVITY LIMITATIONS: standing, squatting, stairs, transfers, and locomotion level  PARTICIPATION LIMITATIONS: driving, community activity, occupation, and yard work  PERSONAL FACTORS: Age, Behavior pattern, Fitness, Past/current experiences, Social background, and Time since onset of injury/illness/exacerbation are also affecting patient's functional outcome.   REHAB POTENTIAL: Good  CLINICAL DECISION MAKING: Stable/uncomplicated  EVALUATION COMPLEXITY: Low   GOALS: Goals reviewed with patient? No  SHORT TERM GOALS: Target date: 06/01/2024    Will be compliant with appropriate progressive HEP  Baseline: Goal status: INITIAL  2.  Will be able to name 3 ways to reduce fall risk at home and in community  Baseline:  Goal status: INITIAL  3.  Will complete TUG in 15 seconds or less no device  Baseline:  Goal status: INITIAL  4.  Will complete 5xSTS in 15 seconds no UEs  Baseline:  Goal status: INITIAL    LONG TERM GOALS: Target date: 06/29/2024    MMT to have improved by at least 1 grade in all weak groups  Baseline:  Goal status: INITIAL  2.  Will score at least 18 on DGI  Baseline:  Goal status: INITIAL  3.  Will able to ambulate in community with no more than baseline LRAD as desired without difficulty  Baseline:  Goal status: INITIAL  4.  Will be able to perform standing tasks for longer than 1 hour Baseline:  Goal status: INITIAL  5.  PSFS to have improved by at least 3 points  Baseline:  Goal status: INITIAL     PLAN:  PT FREQUENCY: 2x/week  PT DURATION: 8 weeks  PLANNED INTERVENTIONS: 97750- Physical Performance Testing, 97110-Therapeutic exercises, 97530- Therapeutic activity, [id number]- Neuromuscular  re-education, 97535- Self Care, [id number]- Manual therapy, [phone]- Gait training,  and DME instructions  PLAN FOR NEXT SESSION: balance, functional activity tolerance, strength- still needs initial HEP   Jane Broughton, PT, DPT, OCS 05/09/24 12:06 PM

## 2024-05-11 ENCOUNTER — Ambulatory Visit

## 2024-05-11 ENCOUNTER — Other Ambulatory Visit: Payer: Self-pay

## 2024-05-11 DIAGNOSIS — R262 Difficulty in walking, not elsewhere classified: Secondary | ICD-10-CM

## 2024-05-11 DIAGNOSIS — R2689 Other abnormalities of gait and mobility: Secondary | ICD-10-CM | POA: Diagnosis not present

## 2024-05-11 DIAGNOSIS — R2681 Unsteadiness on feet: Secondary | ICD-10-CM | POA: Diagnosis not present

## 2024-05-11 DIAGNOSIS — M6281 Muscle weakness (generalized): Secondary | ICD-10-CM

## 2024-05-11 DIAGNOSIS — R296 Repeated falls: Secondary | ICD-10-CM

## 2024-05-11 NOTE — Therapy (Signed)
 OUTPATIENT PHYSICAL THERAPY LOWER EXTREMITY TREATMENT   Patient Name: [name] Short MRN: [id number] DOB:15-Oct-1945, 78 y.o., male Today's Date: 05/11/2024  END OF SESSION:  PT End of Session - 05/11/24 1102     Visit Number 3    Date for PT Re-Evaluation 06/29/24    Authorization Type MCR and BCBS    Authorization Time Period 05/04/24 to 06/29/24    Progress Note Due on Visit 10    PT Start Time 1102    PT Stop Time 1145    PT Time Calculation (min) 43 min    Activity Tolerance Patient tolerated treatment well    Behavior During Therapy WFL for tasks assessed/performed            Past Medical History:  Diagnosis Date   Arthritis    WRIST   Benign localized prostatic hyperplasia with lower urinary tract symptoms (LUTS)    Complication of anesthesia    slow to wake   ED (erectile dysfunction)    GERD (gastroesophageal reflux disease)    History of concussion    AS CHILD--  NO RESIDUAL   History of gout    History of kidney stones    Hyperlipidemia    Inguinal hernia, bilateral    Nephrolithiasis    BILATERAL   PAF (paroxysmal atrial fibrillation) (HCC) currently being followed by pcp   EPISODE --  2011  FOLLOW-UP W/ DR WALL  /  HAS NOT SEEN ANY CARDIOLOGIST SINCE   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 Cindie   Past Surgical History:  Procedure Laterality Date   APPENDECTOMY  1983   CARDIOVASCULAR STRESS TEST  05-08-2010  DR WALL   NO EVIDENCE OF SCAR OR ISCHEMIA/ EF 54%/  PT HAD BOTH AFIB/ AFLUTTER DURING STUDY   CARPAL TUNNEL RELEASE Right 01/ 14/ 2021   CORONARY STENT INTERVENTION N/A 05/02/2022   Procedure: CORONARY STENT INTERVENTION;  Surgeon: Verlin Lonni BIRCH, MD;  Location: [facility];  Service: Cardiovascular;  Laterality: N/A;   CORONARY/GRAFT ACUTE MI REVASCULARIZATION N/A 05/01/2022   Procedure: Coronary/Graft Acute MI Revascularization;  Surgeon: Verlin Lonni BIRCH, MD;  Location: [facility];  Service: Cardiovascular;  Laterality: N/A;   CYSTOSCOPY W/ URETERAL STENT PLACEMENT Left 09/20/2013   Procedure: CYSTOSCOPY WITH STENT REPLACEMENT;  Surgeon: Donnice Gwenyth Brooks, MD;  Location: [facility];  Service: Urology;  Laterality: Left;   CYSTOSCOPY WITH URETEROSCOPY Left 09/20/2013   Procedure: CYSTOSCOPY WITH URETEROSCOPY;  Surgeon: Donnice Gwenyth Brooks, MD;  Location: [facility];  Service: Urology;  Laterality: Left;   CYSTOSCOPY WITH URETEROSCOPY AND STENT PLACEMENT Left 07/12/2013   Procedure: CYSTOSCOPY WITH LITHOLAPEXY, LEFT URETERAL STENT PLACEMENT;  Surgeon: Donnice Gwenyth Brooks, MD;  Location: [facility];  Service: Urology;  Laterality: Left;   CYSTOSCOPY WITH URETEROSCOPY AND STENT PLACEMENT Bilateral 06/23/2014   Procedure: BILATERAL URETEROSCOPY, HOLMIUM LASER LITHOTRIPSY AND STENT PLACEMENT, RIGHT ;  Surgeon: Donnice Brooks, MD;  Location: [facility];  Service: Urology;  Laterality: Bilateral;   EXTRACORPOREAL SHOCK WAVE LITHOTRIPSY  X2   EXTRACORPOREAL SHOCK WAVE LITHOTRIPSY Left 08-29-2013;   07-28-2013   EXTRACORPOREAL SHOCK WAVE LITHOTRIPSY Right 12/09/2018   Procedure: EXTRACORPOREAL SHOCK WAVE LITHOTRIPSY (ESWL);  Surgeon: Brooks Donnice, MD;  Location: [facility];  Service: Urology;  Laterality: Right;   HOLMIUM LASER APPLICATION Left 09/20/2013   Procedure: HOLMIUM LASER APPLICATION;  Surgeon: Donnice Gwenyth Brooks, MD;  Location: [facility];  Service: Urology;  Laterality: Left;   INGUINAL HERNIA REPAIR Bilateral 10/16/2020   Procedure: BILATERAL OPEN INGUINAL HERNIA REPAIR WITH MESH;  Surgeon: Vernetta Berg, MD;  Location: [facility];  Service: General;  Laterality: Bilateral;  LMA/TAP BLOCK   LAPAROSCOPIC INGUINAL HERNIA REPAIR Bilateral 09/04/2010   w/ mesh   LEFT ATRIAL APPENDAGE OCCLUSION N/A 07/17/2022    Procedure: LEFT ATRIAL APPENDAGE OCCLUSION;  Surgeon: Cindie Ole DASEN, MD;  Location: [facility];  Service: Cardiovascular;  Laterality: N/A;   LEFT HEART CATH AND CORONARY ANGIOGRAPHY N/A 05/01/2022   Procedure: LEFT HEART CATH AND CORONARY ANGIOGRAPHY;  Surgeon: Verlin Lonni BIRCH, MD;  Location: [facility];  Service: Cardiovascular;  Laterality: N/A;   TEE WITHOUT CARDIOVERSION N/A 07/17/2022   Procedure: TRANSESOPHAGEAL ECHOCARDIOGRAM (TEE);  Surgeon: Cindie Ole DASEN, MD;  Location: [facility];  Service: Cardiovascular;  Laterality: N/A;   TONSILLECTOMY  AS CHILD   TOTAL HIP ARTHROPLASTY Left 05/11/2002   TOTAL HIP ARTHROPLASTY Right 11/15/2012   Procedure: RIGHT TOTAL HIP ARTHROPLASTY ANTERIOR APPROACH;  Surgeon: Oneil JAYSON Herald, MD;  Location: [facility];  Service: Orthopedics;  Laterality: Right;  Right total hip arthroplasty   TRANSTHORACIC ECHOCARDIOGRAM  05/08/2010   NORMAL LVSF/  EF 55%/  GRADE I DIASTOLIC DYSFUNCTION/  MILD BILATERAL ATRIUM ENLARGEMENT   Watchemn device  07/07/2022   Patient Active Problem List   Diagnosis Date Noted   Posterior tibial tendonitis 08/06/2022   Presence of Watchman left atrial appendage closure device 07/17/2022   Spinal stenosis of lumbar region 06/04/2022   Hematuria 05/16/2022   Coronary artery disease involving native coronary artery of native heart with unstable angina pectoris (HCC)    STEMI (ST elevation myocardial infarction) (HCC) 05/01/2022   Acute ST elevation myocardial infarction (STEMI) of inferior wall (HCC)    Recurrent kidney stones 08/14/2021   History of cerebrovascular accident (CVA) with residual deficit 02/11/2021   CVA (cerebral vascular accident) (HCC) 10/21/2020   Bilateral primary osteoarthritis of knee 02/15/2020   Acquired trigger finger of right little finger 11/03/2019   Encounter for orthopedic follow-up care 10/13/2019   H/O total hip arthroplasty, bilateral 10/24/2018   Gout 08/18/2017    Overweight (BMI 25.0-29.9) 01/23/2017   Foot pain, left 02/11/2016   Sebaceous cyst 07/14/2014   Sun-damaged skin 07/14/2014   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/14/2014   Physical exam, annual 05/17/2012   Inguinal hernia 08/12/2010   HYPERLIPIDEMIA TYPE I / IV 05/01/2010   Paroxysmal a-fib (HCC) 05/01/2010   ABNORMAL ELECTROCARDIOGRAM 03/26/2010    PCP: Mahlon Crank MD   REFERRING PROVIDER: Georgina Ozell LABOR, MD  REFERRING DIAG:  Diagnosis  R26.89 (ICD-10-CM) - Imbalance    THERAPY DIAG:  Unsteadiness on feet  Repeated falls  Muscle weakness (generalized)  Difficulty in walking, not elsewhere classified  Rationale for Evaluation and Treatment: Rehabilitation  ONSET DATE: early May 2025  SUBJECTIVE:   SUBJECTIVE STATEMENT:05/11/24:the L border of my L foot is painful since yesterday morning .  I dont know if I twisted it or turned it  Eval:Had been to the beach in early may with tennis and dancing and what not, fell on May 9th and then again when using just a cane. This progressed to not being able to walk period, had a lot of back pain and went the ED, got pain medicine and tx for bronchitis. Eventually ended up getting MRI which led to lumbar injections, this found that  there was a disc and nerves being pinched or something. Injections helped a lot but now my main concern is imbalance that has been ongoing but slowly started getting better in past 2 weeks.   PERTINENT HISTORY:  See above  PAIN:  Are you having pain? Yes: NPRS scale: 1/10 Pain location: lower back  Pain description: aggravating but not crippling  Aggravating factors: standing for too long in one place, leaning over with something weight wise in my hands  Relieving factors: injections   PRECAUTIONS: Fall  RED FLAGS: None   WEIGHT BEARING RESTRICTIONS: No  FALLS:  Has patient fallen in last 6 months? Yes. 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  DIAGNOSTIC FINDINGS:   CLINICAL DATA:  78 year old male with low back pain radiating down both legs since 02/04/2024. multiple falls.   EXAM: MRI LUMBAR SPINE WITHOUT CONTRAST   TECHNIQUE: Multiplanar, multisequence MR imaging of the lumbar spine was performed. No intravenous contrast was administered.   COMPARISON:  Lumbar spine CT 02/15/2024.  Lumbar MRI 05/13/2022.   FINDINGS: Segmentation: Normal on the comparison, the same numbering system used on the previous MRI.   Alignment: Stable since 2023. Subtle chronic grade 1 anterolisthesis of L3 on L4, mild at L5-S1. Underlying mild S-shaped lumbar scoliosis.   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 New degenerative anterior endplate marrow edema at T12-L1 (series 105, image 8).   Normal background bone marrow signal and maintained vertebral height. Intact visible sacrum and SI joints. No other acute osseous abnormality.   Conus medullaris and cauda equina: Conus extends to the L1 level. No lower spinal cord or conus signal abnormality.   Paraspinal and other soft tissues: Stable, negative Visualized abdominal viscera and paraspinal soft tissues.   Partially visible distended urinary bladder (series 105, image 4).   Disc levels:   Compared to the 2023 MRI:   T11-T12: Stable mild facet hypertrophy on the  right without stenosis.   T12-L1: Progressed anterior disc space loss and new vacuum disc (pronounced vacuum phenomena on the recent CT). Stable mild disc bulging without stenosis.   L1-L2: Interbody ankylosis now. Endplate spurring. Moderate chronic left L1 foraminal stenosis. No spinal stenosis.   L2-L3: Minor disc bulging. Stable mild facet and ligament flavum hypertrophy. No stenosis.   L3-L4: Progressed and now moderate to severe spinal and right lateral recess (right L4 nerve level) stenosis since 2023 in the setting of mild chronic anterolisthesis. Asymmetric disc/pseudo disc to the right with severe ligament flavum hypertrophy. Moderate to severe facet hypertrophy. Decreased facet joint fluid at this level. Moderate left L3 foraminal stenosis also appears mildly progressed.   L4-L5: Chronic disc space loss with increased circumferential disc bulge asymmetric to the right, moderate facet and ligament flavum hypertrophy. Increased mild spinal and moderate right lateral recess stenosis (right L5 nerve level series 106 image 33). Moderate right L4  neural foraminal stenosis also appears increased.   L5-S1: Stable mild disc/pseudo disc bulging. Moderate chronic facet degeneration and degenerative facet joint fluid. The left foraminal synovial cyst is less apparent on series 106, image 33. No convincing stenosis now.   IMPRESSION: 1. 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 - progressed and now moderate to severe multifactorial spinal and right lateral recess stenosis at L3-L4. Progressed moderate left foraminal stenosis. - progressed mild spinal and moderate multifactorial  right lateral recess and right foraminal stenosis at L4-L5.   2. Chronic L5-S1 facet arthropathy, but a left foraminal synovial cysts there seems regressed.   3. Distended urinary bladder.  Query urinary retention.   XRs of the lumbar spine from 03/02/2024 were independently reviewed and  interpreted, showing lumbar scoliotic curvature that measures 17 degrees  with apex to the right.  Disc height loss at L3/4 and L4/5.  No other  significant degenerative changes.  No evidence of instability on  flexion/extension views.  No fracture or dislocation seen.      PATIENT SURVEYS:  PSFS: THE PATIENT SPECIFIC FUNCTIONAL SCALE  Place score of 0-10 (0 = unable to perform activity and 10 = able to perform activity at the same level as before injury or problem)  Activity Date: 05/04/24    Walking with no device  4    2. Standing for a long time (>45 minutes) 2    3. Balance/steadiness in general  3    4.      Total Score 3      Total Score = Sum of activity scores/number of activities  Minimally Detectable Change: 3 points (for single activity); 2 points (for average score)  [facility] (nd). The Patient Specific Functional Scale . Retrieved from [email or web address]   COGNITION: Overall cognitive status: Within functional limits for tasks assessed        LOWER EXTREMITY MMT:  MMT Right eval Left eval  Hip flexion 4 4  Hip extension    Hip abduction 4- seated  4- seated   Hip adduction    Hip internal rotation    Hip external rotation    Knee flexion 4+ 4+  Knee extension 4+ 4+  Ankle dorsiflexion 4+ 4+  Ankle plantarflexion    Ankle inversion    Ankle eversion     (Blank rows = not tested)    FUNCTIONAL TESTS:  5 times sit to stand: 18.5 seconds no UEs  Timed up and go (TUG): 21 seconds no device  3 minute walk test: 41ft no device min guard   GAIT: Distance walked: 415ft Assistive device  utilized: None Level of assistance: CGA Comments: more steady at first, got a lot more unsteady as fatigued increased, did need light MinA on turns towards end of test  TREATMENT DATE:  05/11/24: Knee ext B 10#, 3x 10 Knee flexion B 25# 3 x 10 Leg press, in staggered position, terminal knee ext from 30 degrees to full ext , mass practice each leg forward 30# Leg press B LE's seat on 1 for deeper engagement of gluteals 30# 3 x 10    05/09/24:  IN ll bars, heel/ toe rocks on airex In ll bars side stepping with green band around thighs. 6 reps Sit to stand elevated mat 10 reps, no UE support Sit to stand elevated mat with yellow mediball punches 10 reps In ll bars staggered position, lead foot on 8 step, static standing, then horizontal head turns 15x each foot Nustep level 5 x 6 min x 4 ext Knee ext B 10#, 2 x 15 Lateral heel taps, 15x each foot, in ll bars from 2 block    05/04/24  Eval, POC     PATIENT EDUCATION:  Education details: exam findings, POC Person educated: Patient Education method: Programmer, multimedia, Demonstration, and Handouts Education comprehension: verbalized understanding, returned demonstration, and needs further education  HOME EXERCISE PROGRAM: TBD   ASSESSMENT:  CLINICAL IMPRESSION: 05/11/24:  the patient returned today for his second physical therapy intervention following his evaluation.today we performed more  strength training, avoided stressing his ankles due to new onset L lateral foot pain with weight bearing.  no c/o back pain during session.   He should benefit from ongoing strengthening and balance training to improve his activity tolerance and function.    Patient is a 78 y.o. M who was seen today for physical therapy evaluation and treatment for  Diagnosis  R26.89 (ICD-10-CM) - Imbalance  . Arrived late to eval, very  pleasant and cooperative but very talkative to so exam was very limited. Will make every effort to address functional concerns and reduce fall risk moving forward.   OBJECTIVE IMPAIRMENTS: Abnormal gait, decreased activity tolerance, decreased balance, decreased coordination, decreased knowledge of use of DME, decreased mobility, difficulty walking, decreased strength, and pain.   ACTIVITY LIMITATIONS: standing, squatting, stairs, transfers, and locomotion level  PARTICIPATION LIMITATIONS: driving, community activity, occupation, and yard work  PERSONAL FACTORS: Age, Behavior pattern, Fitness, Past/current experiences, Social background, and Time since onset of injury/illness/exacerbation are also affecting patient's functional outcome.   REHAB POTENTIAL: Good  CLINICAL DECISION MAKING: Stable/uncomplicated  EVALUATION COMPLEXITY: Low   GOALS: Goals reviewed with patient? No  SHORT TERM GOALS: Target date: 06/01/2024    Will be compliant with appropriate progressive HEP  Baseline: Goal status: INITIAL  2.  Will be able to name 3 ways to reduce fall risk at home and in community  Baseline:  Goal status: INITIAL  3.  Will complete TUG in 15 seconds or less no device  Baseline:  Goal status: INITIAL  4.  Will complete 5xSTS in 15 seconds no UEs  Baseline:  Goal status: INITIAL    LONG TERM GOALS: Target date: 06/29/2024    MMT to have improved by at least 1 grade in all weak groups  Baseline:  Goal status: INITIAL  2.  Will score at least 18 on DGI  Baseline:  Goal status: INITIAL  3.  Will able to ambulate in community with no more than baseline LRAD as desired without difficulty  Baseline:  Goal status: INITIAL  4.  Will be able to perform standing tasks for longer than 1 hour Baseline:  Goal status: INITIAL  5.  PSFS to have improved by at least 3 points  Baseline:  Goal status: INITIAL     PLAN:  PT FREQUENCY: 2x/week  PT DURATION: 8  weeks  PLANNED INTERVENTIONS: 97750- Physical Performance Testing, 97110-Therapeutic exercises, 97530- Therapeutic activity, [id number]- Neuromuscular re-education, 97535- Self Care, [id number]- Manual therapy, [id number]- Gait training, and DME instructions  PLAN FOR NEXT SESSION: balance, functional activity tolerance, strength- still needs initial HEP   Almeda Ezra, PT, DPT, OCS 05/11/24 11:55 AM

## 2024-05-16 ENCOUNTER — Other Ambulatory Visit: Payer: Self-pay

## 2024-05-16 ENCOUNTER — Ambulatory Visit

## 2024-05-16 DIAGNOSIS — R2681 Unsteadiness on feet: Secondary | ICD-10-CM

## 2024-05-16 DIAGNOSIS — M6281 Muscle weakness (generalized): Secondary | ICD-10-CM | POA: Diagnosis not present

## 2024-05-16 DIAGNOSIS — R262 Difficulty in walking, not elsewhere classified: Secondary | ICD-10-CM

## 2024-05-16 DIAGNOSIS — R296 Repeated falls: Secondary | ICD-10-CM

## 2024-05-16 DIAGNOSIS — R2689 Other abnormalities of gait and mobility: Secondary | ICD-10-CM | POA: Diagnosis not present

## 2024-05-16 NOTE — Therapy (Signed)
 OUTPATIENT PHYSICAL THERAPY LOWER EXTREMITY TREATMENT   Patient Name: [name] Short MRN: [id number] DOB:07/05/1946, 78 y.o., male Today's Date: 05/16/2024  END OF SESSION:  PT End of Session - 05/16/24 1014     Visit Number 4    Number of Visits 17    Date for PT Re-Evaluation 06/29/24    Authorization Time Period 05/04/24 to 06/29/24    Progress Note Due on Visit 10    PT Start Time 1015    PT Stop Time 1100    PT Time Calculation (min) 45 min    Activity Tolerance Patient tolerated treatment well    Behavior During Therapy WFL for tasks assessed/performed             Past Medical History:  Diagnosis Date   Arthritis    WRIST   Benign localized prostatic hyperplasia with lower urinary tract symptoms (LUTS)    Complication of anesthesia    slow to wake   ED (erectile dysfunction)    GERD (gastroesophageal reflux disease)    History of concussion    AS CHILD--  NO RESIDUAL   History of gout    History of kidney stones    Hyperlipidemia    Inguinal hernia, bilateral    Nephrolithiasis    BILATERAL   PAF (paroxysmal atrial fibrillation) (HCC) currently being followed by pcp   EPISODE --  2011  FOLLOW-UP W/ DR WALL  /  HAS NOT SEEN ANY CARDIOLOGIST SINCE   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 Cindie   Past Surgical History:  Procedure Laterality Date   APPENDECTOMY  1983   CARDIOVASCULAR STRESS TEST  05-08-2010  DR WALL   NO EVIDENCE OF SCAR OR ISCHEMIA/ EF 54%/  PT HAD BOTH AFIB/ AFLUTTER DURING STUDY   CARPAL TUNNEL RELEASE Right 01/ 14/ 2021   CORONARY STENT INTERVENTION N/A 05/02/2022   Procedure: CORONARY STENT INTERVENTION;  Surgeon: Verlin Lonni BIRCH, MD;  Location: [facility];  Service: Cardiovascular;  Laterality: N/A;   CORONARY/GRAFT ACUTE MI REVASCULARIZATION N/A 05/01/2022   Procedure: Coronary/Graft Acute MI Revascularization;  Surgeon: Verlin Lonni BIRCH, MD;   Location: [facility];  Service: Cardiovascular;  Laterality: N/A;   CYSTOSCOPY W/ URETERAL STENT PLACEMENT Left 09/20/2013   Procedure: CYSTOSCOPY WITH STENT REPLACEMENT;  Surgeon: Donnice Gwenyth Brooks, MD;  Location: [facility];  Service: Urology;  Laterality: Left;   CYSTOSCOPY WITH URETEROSCOPY Left 09/20/2013   Procedure: CYSTOSCOPY WITH URETEROSCOPY;  Surgeon: Donnice Gwenyth Brooks, MD;  Location: [facility];  Service: Urology;  Laterality: Left;   CYSTOSCOPY WITH URETEROSCOPY AND STENT PLACEMENT Left 07/12/2013   Procedure: CYSTOSCOPY WITH LITHOLAPEXY, LEFT URETERAL STENT PLACEMENT;  Surgeon: Donnice Gwenyth Brooks, MD;  Location: [facility];  Service: Urology;  Laterality: Left;   CYSTOSCOPY WITH URETEROSCOPY AND STENT PLACEMENT Bilateral 06/23/2014   Procedure: BILATERAL URETEROSCOPY, HOLMIUM LASER LITHOTRIPSY AND STENT PLACEMENT, RIGHT ;  Surgeon: Donnice Brooks, MD;  Location: [facility];  Service: Urology;  Laterality: Bilateral;   EXTRACORPOREAL SHOCK WAVE LITHOTRIPSY  X2   EXTRACORPOREAL SHOCK WAVE LITHOTRIPSY Left 08-29-2013;   07-28-2013   EXTRACORPOREAL SHOCK WAVE LITHOTRIPSY Right 12/09/2018   Procedure: EXTRACORPOREAL SHOCK WAVE LITHOTRIPSY (ESWL);  Surgeon: Brooks Donnice, MD;  Location: [facility];  Service: Urology;  Laterality: Right;   HOLMIUM LASER APPLICATION Left 09/20/2013   Procedure: HOLMIUM LASER APPLICATION;  Surgeon: Donnice Gwenyth Brooks, MD;  Location: [facility];  Service: Urology;  Laterality: Left;   INGUINAL HERNIA REPAIR Bilateral 10/16/2020   Procedure: BILATERAL OPEN INGUINAL HERNIA REPAIR WITH MESH;  Surgeon: Vernetta Berg, MD;  Location: [facility];  Service: General;  Laterality: Bilateral;  LMA/TAP BLOCK   LAPAROSCOPIC INGUINAL HERNIA REPAIR Bilateral 09/04/2010   w/ mesh   LEFT ATRIAL APPENDAGE OCCLUSION N/A 07/17/2022   Procedure: LEFT  ATRIAL APPENDAGE OCCLUSION;  Surgeon: Cindie Ole DASEN, MD;  Location: [facility];  Service: Cardiovascular;  Laterality: N/A;   LEFT HEART CATH AND CORONARY ANGIOGRAPHY N/A 05/01/2022   Procedure: LEFT HEART CATH AND CORONARY ANGIOGRAPHY;  Surgeon: Verlin Lonni BIRCH, MD;  Location: [facility];  Service: Cardiovascular;  Laterality: N/A;   TEE WITHOUT CARDIOVERSION N/A 07/17/2022   Procedure: TRANSESOPHAGEAL ECHOCARDIOGRAM (TEE);  Surgeon: Cindie Ole DASEN, MD;  Location: [facility];  Service: Cardiovascular;  Laterality: N/A;   TONSILLECTOMY  AS CHILD   TOTAL HIP ARTHROPLASTY Left 05/11/2002   TOTAL HIP ARTHROPLASTY Right 11/15/2012   Procedure: RIGHT TOTAL HIP ARTHROPLASTY ANTERIOR APPROACH;  Surgeon: Oneil JAYSON Herald, MD;  Location: [facility];  Service: Orthopedics;  Laterality: Right;  Right total hip arthroplasty   TRANSTHORACIC ECHOCARDIOGRAM  05/08/2010   NORMAL LVSF/  EF 55%/  GRADE I DIASTOLIC DYSFUNCTION/  MILD BILATERAL ATRIUM ENLARGEMENT   Watchemn device  07/07/2022   Patient Active Problem List   Diagnosis Date Noted   Posterior tibial tendonitis 08/06/2022   Presence of Watchman left atrial appendage closure device 07/17/2022   Spinal stenosis of lumbar region 06/04/2022   Hematuria 05/16/2022   Coronary artery disease involving native coronary artery of native heart with unstable angina pectoris (HCC)    STEMI (ST elevation myocardial infarction) (HCC) 05/01/2022   Acute ST elevation myocardial infarction (STEMI) of inferior wall (HCC)    Recurrent kidney stones 08/14/2021   History of cerebrovascular accident (CVA) with residual deficit 02/11/2021   CVA (cerebral vascular accident) (HCC) 10/21/2020   Bilateral primary osteoarthritis of knee 02/15/2020   Acquired trigger finger of right little finger 11/03/2019   Encounter for orthopedic follow-up care 10/13/2019   H/O total hip arthroplasty, bilateral 10/24/2018   Gout 08/18/2017   Overweight  (BMI 25.0-29.9) 01/23/2017   Foot pain, left 02/11/2016   Sebaceous cyst 07/14/2014   Sun-damaged skin 07/14/2014   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/14/2014   Physical exam, annual 05/17/2012   Inguinal hernia 08/12/2010   HYPERLIPIDEMIA TYPE I / IV 05/01/2010   Paroxysmal a-fib (HCC) 05/01/2010   ABNORMAL ELECTROCARDIOGRAM 03/26/2010    PCP: Mahlon Crank MD   REFERRING PROVIDER: Georgina Ozell LABOR, MD  REFERRING DIAG:  Diagnosis  R26.89 (ICD-10-CM) - Imbalance    THERAPY DIAG:  Unsteadiness on feet  Repeated falls  Muscle weakness (generalized)  Difficulty in walking, not elsewhere classified  Rationale for Evaluation and Treatment: Rehabilitation  ONSET DATE: early May 2025  SUBJECTIVE:   SUBJECTIVE STATEMENT:05/16/24:the L border of my L foot is not hurting anymore I took some gout medicine and it seemed to help.  Still hurts lower back to stand too long Eval:Had been to the beach in early may with tennis and dancing and what not, fell on May 9th and then again when using just a cane. This progressed to not being able to walk period, had a lot of back pain and went the ED, got pain medicine and tx for bronchitis. Eventually ended up getting MRI which led to  lumbar injections, this found that there was a disc and nerves being pinched or something. Injections helped a lot but now my main concern is imbalance that has been ongoing but slowly started getting better in past 2 weeks.   PERTINENT HISTORY:  See above  PAIN:  Are you having pain? Yes: NPRS scale: 1/10 Pain location: lower back  Pain description: aggravating but not crippling  Aggravating factors: standing for too long in one place, leaning over with something weight wise in my hands  Relieving factors: injections   PRECAUTIONS: Fall  RED FLAGS: None   WEIGHT BEARING RESTRICTIONS: No  FALLS:  Has patient fallen in last 6 months? Yes. 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  DIAGNOSTIC FINDINGS:   CLINICAL DATA:  78 year old male with low back pain radiating down both legs since 02/04/2024. multiple falls.   EXAM: MRI LUMBAR SPINE WITHOUT CONTRAST   TECHNIQUE: Multiplanar, multisequence MR imaging of the lumbar spine was performed. No intravenous contrast was administered.   COMPARISON:  Lumbar spine CT 02/15/2024.  Lumbar MRI 05/13/2022.   FINDINGS: Segmentation: Normal on the comparison, the same numbering system used on the previous MRI.   Alignment: Stable since 2023. Subtle chronic grade 1 anterolisthesis of L3 on L4, mild at L5-S1. Underlying mild S-shaped lumbar scoliosis.   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 New degenerative anterior endplate marrow edema at T12-L1 (series 105, image 8).   Normal background bone marrow signal and maintained vertebral height. Intact visible sacrum and SI joints. No other acute osseous abnormality.   Conus medullaris and cauda equina: Conus extends to the L1 level. No lower spinal cord or conus signal abnormality.   Paraspinal and other soft tissues: Stable, negative Visualized abdominal viscera and paraspinal soft tissues.   Partially visible distended urinary bladder (series 105, image 4).   Disc levels:   Compared to the 2023 MRI:   T11-T12: Stable mild facet  hypertrophy on the right without stenosis.   T12-L1: Progressed anterior disc space loss and new vacuum disc (pronounced vacuum phenomena on the recent CT). Stable mild disc bulging without stenosis.   L1-L2: Interbody ankylosis now. Endplate spurring. Moderate chronic left L1 foraminal stenosis. No spinal stenosis.   L2-L3: Minor disc bulging. Stable mild facet and ligament flavum hypertrophy. No stenosis.   L3-L4: Progressed and now moderate to severe spinal and right lateral recess (right L4 nerve level) stenosis since 2023 in the setting of mild chronic anterolisthesis. Asymmetric disc/pseudo disc to the right with severe ligament flavum hypertrophy. Moderate to severe facet hypertrophy. Decreased facet joint fluid at this level. Moderate left L3 foraminal stenosis also appears mildly progressed.   L4-L5: Chronic disc space loss with increased circumferential disc bulge asymmetric to the right, moderate facet and ligament flavum hypertrophy. Increased mild spinal and moderate right lateral recess stenosis (right L5 nerve level series 106  image 33). Moderate right L4 neural foraminal stenosis also appears increased.   L5-S1: Stable mild disc/pseudo disc bulging. Moderate chronic facet degeneration and degenerative facet joint fluid. The left foraminal synovial cyst is less apparent on series 106, image 33. No convincing stenosis now.   IMPRESSION: 1. 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 - progressed and now moderate to severe multifactorial spinal and right lateral recess stenosis at L3-L4. Progressed moderate left foraminal stenosis. - progressed mild spinal and moderate  multifactorial right lateral recess and right foraminal stenosis at L4-L5.   2. Chronic L5-S1 facet arthropathy, but a left foraminal synovial cysts there seems regressed.   3. Distended urinary bladder.  Query urinary retention.   XRs of the lumbar spine from 03/02/2024 were independently reviewed and  interpreted, showing lumbar scoliotic curvature that measures 17 degrees  with apex to the right.  Disc height loss at L3/4 and L4/5.  No other  significant degenerative changes.  No evidence of instability on  flexion/extension views.  No fracture or dislocation seen.      PATIENT SURVEYS:  PSFS: THE PATIENT SPECIFIC FUNCTIONAL SCALE  Place score of 0-10 (0 = unable to perform activity and 10 = able to perform activity at the same level as before injury or problem)  Activity Date: 05/04/24    Walking with no device  4    2. Standing for a long time (>45 minutes) 2    3. Balance/steadiness in general  3    4.      Total Score 3      Total Score = Sum of activity scores/number of activities  Minimally Detectable Change: 3 points (for single activity); 2 points (for average score)  [facility] (nd). The Patient Specific Functional Scale . Retrieved from [email or web address]   COGNITION: Overall cognitive status: Within functional limits for tasks assessed        LOWER EXTREMITY MMT:  MMT Right eval Left eval  Hip flexion 4 4  Hip extension    Hip abduction 4- seated  4- seated   Hip adduction    Hip internal rotation    Hip external rotation    Knee flexion 4+ 4+  Knee extension 4+ 4+  Ankle dorsiflexion 4+ 4+  Ankle plantarflexion    Ankle inversion    Ankle eversion     (Blank rows = not tested)    FUNCTIONAL TESTS:  5 times sit to stand: 18.5 seconds no UEs  Timed up and go (TUG): 21 seconds no device  3 minute walk test: 433ft no device min guard   GAIT: Distance walked:  453ft Assistive device utilized: None Level of assistance: CGA Comments: more steady at first, got a lot more unsteady as fatigued increased, did need light MinA on turns towards end of test  TREATMENT DATE:  05/16/24: Therapeutic exercise  and Neuromuscular re education: Nustep level 5 x 6 min Ue's and LE's Lunges on compliant surface BOSU 30x each leg, light R UE support Knee flexion B 25#, 15 x 2  Knee ext B 10# 3 x 10 Standing modified tandem with lead foot on 8 step for horizontal head turns B plantarflexor stretch, with B heel raises forefeet on 3 bar, 30 x Standing tandem, 30 sec holds, each foot once set  B leg press 20# 4 x 10  Standing for alt hip abd with 2# on each leg, 10 reps each  05/11/24: Knee ext B 10#, 3x 10 Knee flexion B 25# 3 x 10 Leg press, in staggered position, terminal knee ext from 30 degrees to full ext , mass practice each leg forward 30# Leg press B LE's seat on 1 for deeper engagement of gluteals 30# 3 x 10    05/09/24:  IN ll bars, heel/ toe rocks on airex In ll bars side stepping with green band around thighs. 6 reps Sit to stand elevated mat 10 reps, no UE support Sit to stand elevated mat with yellow mediball punches 10 reps In ll bars staggered position, lead foot on 8 step, static standing, then horizontal head turns 15x each foot Nustep level 5 x 6 min x 4 ext Knee ext B 10#, 2 x 15 Lateral heel taps, 15x each foot, in ll bars from 2 block    05/04/24  Eval, POC     PATIENT EDUCATION:  Education details: exam findings, POC Person educated: Patient Education method: Programmer, multimedia, Demonstration, and Handouts Education comprehension: verbalized understanding, returned demonstration, and needs further education  HOME EXERCISE PROGRAM: TBD   ASSESSMENT:  CLINICAL IMPRESSION: 05/16/24:  the patient  returned today for his third  physical therapy intervention following his evaluation. As he no longer was experiencing pain L foot we resumed combinations of standing balance challenges and bulk strengthening.    No c/o back pain during session.   He should benefit from ongoing strengthening and balance training to improve his activity tolerance and function.    Patient is a 78 y.o. M who was seen today for physical therapy evaluation and treatment for  Diagnosis  R26.89 (ICD-10-CM) - Imbalance  . Arrived late to eval, very pleasant and cooperative but very talkative to so exam was very limited. Will make every effort to address functional concerns and reduce fall risk moving forward.   OBJECTIVE IMPAIRMENTS: Abnormal gait, decreased activity tolerance, decreased balance, decreased coordination, decreased knowledge of use of DME, decreased mobility, difficulty walking, decreased strength, and pain.   ACTIVITY LIMITATIONS: standing, squatting, stairs, transfers, and locomotion level  PARTICIPATION LIMITATIONS: driving, community activity, occupation, and yard work  PERSONAL FACTORS: Age, Behavior pattern, Fitness, Past/current experiences, Social background, and Time since onset of injury/illness/exacerbation are also affecting patient's functional outcome.   REHAB POTENTIAL: Good  CLINICAL DECISION MAKING: Stable/uncomplicated  EVALUATION COMPLEXITY: Low   GOALS: Goals reviewed with patient? No  SHORT TERM GOALS: Target date: 06/01/2024    Will be compliant with appropriate progressive HEP  Baseline: Goal status: INITIAL  2.  Will be able to name 3 ways to reduce fall risk at home and in community  Baseline:  Goal status: INITIAL  3.  Will complete TUG in 15 seconds or less no device  Baseline:  Goal status: INITIAL  4.  Will complete 5xSTS in 15 seconds no UEs  Baseline:  Goal status: INITIAL  LONG TERM GOALS: Target date: 06/29/2024    MMT to have improved by at  least 1 grade in all weak groups  Baseline:  Goal status: INITIAL  2.  Will score at least 18 on DGI  Baseline:  Goal status: INITIAL  3.  Will able to ambulate in community with no more than baseline LRAD as desired without difficulty  Baseline:  Goal status: INITIAL  4.  Will be able to perform standing tasks for longer than 1 hour Baseline:  Goal status: INITIAL  5.  PSFS to have improved by at least 3 points  Baseline:  Goal status: INITIAL     PLAN:  PT FREQUENCY: 2x/week  PT DURATION: 8 weeks  PLANNED INTERVENTIONS: 97750- Physical Performance Testing, 97110-Therapeutic exercises, 97530- Therapeutic activity, 97112- Neuromuscular re-education, 97535- Self Care, [id number]- Manual therapy, [id number]- Gait training, and DME instructions  PLAN FOR NEXT SESSION: balance, functional activity tolerance, strength- still needs initial HEP   Malick Netz, PT, DPT, OCS 05/16/24 1:33 PM

## 2024-05-18 ENCOUNTER — Ambulatory Visit

## 2024-05-18 ENCOUNTER — Other Ambulatory Visit: Payer: Self-pay

## 2024-05-18 DIAGNOSIS — R2681 Unsteadiness on feet: Secondary | ICD-10-CM | POA: Diagnosis not present

## 2024-05-18 DIAGNOSIS — R296 Repeated falls: Secondary | ICD-10-CM | POA: Diagnosis not present

## 2024-05-18 DIAGNOSIS — R262 Difficulty in walking, not elsewhere classified: Secondary | ICD-10-CM

## 2024-05-18 DIAGNOSIS — R2689 Other abnormalities of gait and mobility: Secondary | ICD-10-CM | POA: Diagnosis not present

## 2024-05-18 DIAGNOSIS — M6281 Muscle weakness (generalized): Secondary | ICD-10-CM | POA: Diagnosis not present

## 2024-05-18 NOTE — Therapy (Signed)
 OUTPATIENT PHYSICAL THERAPY LOWER EXTREMITY TREATMENT   Patient Name: [name] Short MRN: [id number] DOB:[birth year], 78 y.o., male Today's Date: 05/18/2024  END OF SESSION:  PT End of Session - 05/18/24 1106     Visit Number 5    Number of Visits 17    Date for PT Re-Evaluation 06/29/24    Authorization Type MCR and BCBS    Authorization Time Period 05/04/24 to 06/29/24    Progress Note Due on Visit 10    PT Start Time 1103    PT Stop Time 1148    PT Time Calculation (min) 45 min    Activity Tolerance Patient tolerated treatment well    Behavior During Therapy WFL for tasks assessed/performed              Past Medical History:  Diagnosis Date   Arthritis    WRIST   Benign localized prostatic hyperplasia with lower urinary tract symptoms (LUTS)    Complication of anesthesia    slow to wake   ED (erectile dysfunction)    GERD (gastroesophageal reflux disease)    History of concussion    AS CHILD--  NO RESIDUAL   History of gout    History of kidney stones    Hyperlipidemia    Inguinal hernia, bilateral    Nephrolithiasis    BILATERAL   PAF (paroxysmal atrial fibrillation) (HCC) currently being followed by pcp   EPISODE --  2011  FOLLOW-UP W/ DR WALL  /  HAS NOT SEEN ANY CARDIOLOGIST SINCE   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 Cindie   Past Surgical History:  Procedure Laterality Date   APPENDECTOMY  1983   CARDIOVASCULAR STRESS TEST  05-08-2010  DR WALL   NO EVIDENCE OF SCAR OR ISCHEMIA/ EF 54%/  PT HAD BOTH AFIB/ AFLUTTER DURING STUDY   CARPAL TUNNEL RELEASE Right 01/ 14/ 2021   CORONARY STENT INTERVENTION N/A 05/02/2022   Procedure: CORONARY STENT INTERVENTION;  Surgeon: Verlin Lonni BIRCH, MD;  Location: [facility];  Service: Cardiovascular;  Laterality: N/A;   CORONARY/GRAFT ACUTE MI REVASCULARIZATION N/A 05/01/2022   Procedure: Coronary/Graft Acute MI Revascularization;   Surgeon: Verlin Lonni BIRCH, MD;  Location: [facility];  Service: Cardiovascular;  Laterality: N/A;   CYSTOSCOPY W/ URETERAL STENT PLACEMENT Left 09/20/2013   Procedure: CYSTOSCOPY WITH STENT REPLACEMENT;  Surgeon: Donnice Gwenyth Brooks, MD;  Location: [facility];  Service: Urology;  Laterality: Left;   CYSTOSCOPY WITH URETEROSCOPY Left 09/20/2013   Procedure: CYSTOSCOPY WITH URETEROSCOPY;  Surgeon: Donnice Gwenyth Brooks, MD;  Location: [facility];  Service: Urology;  Laterality: Left;   CYSTOSCOPY WITH URETEROSCOPY AND STENT PLACEMENT Left 07/12/2013   Procedure: CYSTOSCOPY WITH LITHOLAPEXY, LEFT URETERAL STENT PLACEMENT;  Surgeon: Donnice Gwenyth Brooks, MD;  Location: [facility];  Service: Urology;  Laterality: Left;   CYSTOSCOPY WITH URETEROSCOPY AND STENT PLACEMENT Bilateral 06/23/2014   Procedure: BILATERAL URETEROSCOPY, HOLMIUM LASER LITHOTRIPSY AND STENT PLACEMENT, RIGHT ;  Surgeon: Donnice Brooks, MD;  Location: [facility];  Service: Urology;  Laterality: Bilateral;   EXTRACORPOREAL SHOCK WAVE LITHOTRIPSY  X2   EXTRACORPOREAL SHOCK WAVE LITHOTRIPSY Left 08-29-2013;   07-28-2013   EXTRACORPOREAL SHOCK WAVE LITHOTRIPSY Right 12/09/2018   Procedure: EXTRACORPOREAL SHOCK WAVE LITHOTRIPSY (ESWL);  Surgeon: Brooks Donnice, MD;  Location: [facility];  Service: Urology;  Laterality: Right;   HOLMIUM LASER APPLICATION Left 09/20/2013   Procedure:  HOLMIUM LASER APPLICATION;  Surgeon: Donnice Gwenyth Brooks, MD;  Location: [facility];  Service: Urology;  Laterality: Left;   INGUINAL HERNIA REPAIR Bilateral 10/16/2020   Procedure: BILATERAL OPEN INGUINAL HERNIA REPAIR WITH MESH;  Surgeon: Vernetta Berg, MD;  Location: [facility];  Service: General;  Laterality: Bilateral;  LMA/TAP BLOCK   LAPAROSCOPIC INGUINAL HERNIA REPAIR Bilateral 09/04/2010   w/ mesh   LEFT ATRIAL APPENDAGE  OCCLUSION N/A 07/17/2022   Procedure: LEFT ATRIAL APPENDAGE OCCLUSION;  Surgeon: Cindie Ole DASEN, MD;  Location: [facility];  Service: Cardiovascular;  Laterality: N/A;   LEFT HEART CATH AND CORONARY ANGIOGRAPHY N/A 05/01/2022   Procedure: LEFT HEART CATH AND CORONARY ANGIOGRAPHY;  Surgeon: Verlin Lonni BIRCH, MD;  Location: [facility];  Service: Cardiovascular;  Laterality: N/A;   TEE WITHOUT CARDIOVERSION N/A 07/17/2022   Procedure: TRANSESOPHAGEAL ECHOCARDIOGRAM (TEE);  Surgeon: Cindie Ole DASEN, MD;  Location: [facility];  Service: Cardiovascular;  Laterality: N/A;   TONSILLECTOMY  AS CHILD   TOTAL HIP ARTHROPLASTY Left 05/11/2002   TOTAL HIP ARTHROPLASTY Right 11/15/2012   Procedure: RIGHT TOTAL HIP ARTHROPLASTY ANTERIOR APPROACH;  Surgeon: Oneil JAYSON Herald, MD;  Location: [facility];  Service: Orthopedics;  Laterality: Right;  Right total hip arthroplasty   TRANSTHORACIC ECHOCARDIOGRAM  05/08/2010   NORMAL LVSF/  EF 55%/  GRADE I DIASTOLIC DYSFUNCTION/  MILD BILATERAL ATRIUM ENLARGEMENT   Watchemn device  07/07/2022   Patient Active Problem List   Diagnosis Date Noted   Posterior tibial tendonitis 08/06/2022   Presence of Watchman left atrial appendage closure device 07/17/2022   Spinal stenosis of lumbar region 06/04/2022   Hematuria 05/16/2022   Coronary artery disease involving native coronary artery of native heart with unstable angina pectoris (HCC)    STEMI (ST elevation myocardial infarction) (HCC) 05/01/2022   Acute ST elevation myocardial infarction (STEMI) of inferior wall (HCC)    Recurrent kidney stones 08/14/2021   History of cerebrovascular accident (CVA) with residual deficit 02/11/2021   CVA (cerebral vascular accident) (HCC) 10/21/2020   Bilateral primary osteoarthritis of knee 02/15/2020   Acquired trigger finger of right little finger 11/03/2019   Encounter for orthopedic follow-up care 10/13/2019   H/O total hip arthroplasty, bilateral  10/24/2018   Gout 08/18/2017   Overweight (BMI 25.0-29.9) 01/23/2017   Foot pain, left 02/11/2016   Sebaceous cyst 07/14/2014   Sun-damaged skin 07/14/2014   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/14/2014   Physical exam, annual 05/17/2012   Inguinal hernia 08/12/2010   HYPERLIPIDEMIA TYPE I / IV 05/01/2010   Paroxysmal a-fib (HCC) 05/01/2010   ABNORMAL ELECTROCARDIOGRAM 03/26/2010    PCP: Mahlon Crank MD   REFERRING PROVIDER: Georgina Ozell LABOR, MD  REFERRING DIAG:  Diagnosis  R26.89 (ICD-10-CM) - Imbalance    THERAPY DIAG:  Unsteadiness on feet  Repeated falls  Muscle weakness (generalized)  Difficulty in walking, not elsewhere classified  Rationale for Evaluation and Treatment: Rehabilitation  ONSET DATE: early May 2025  SUBJECTIVE:   SUBJECTIVE STATEMENT:05/18/24:I feel like my balance and stability have improved.  My lower back still hurts if I stand too long.  Eval:Had been to the beach in early may with tennis and dancing and what not, fell on May 9th and then again when using just a cane. This progressed to not being able to walk period, had a lot of back pain and went the ED, got pain medicine and tx for bronchitis. Eventually ended up getting MRI which led  to lumbar injections, this found that there was a disc and nerves being pinched or something. Injections helped a lot but now my main concern is imbalance that has been ongoing but slowly started getting better in past 2 weeks.   PERTINENT HISTORY:  See above  PAIN:  Are you having pain? Yes: NPRS scale: 1/10 Pain location: lower back  Pain description: aggravating but not crippling  Aggravating factors: standing for too long in one place, leaning over with something weight wise in my hands  Relieving factors: injections   PRECAUTIONS: Fall  RED FLAGS: None   WEIGHT BEARING RESTRICTIONS: No  FALLS:  Has patient fallen in last 6 months? Yes. 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  DIAGNOSTIC FINDINGS:   CLINICAL DATA:  78 year old male with low back pain radiating down both legs since 02/04/2024. multiple falls.   EXAM: MRI LUMBAR SPINE WITHOUT CONTRAST   TECHNIQUE: Multiplanar, multisequence MR imaging of the lumbar spine was performed. No intravenous contrast was administered.   COMPARISON:  Lumbar spine CT 02/15/2024.  Lumbar MRI 05/13/2022.   FINDINGS: Segmentation: Normal on the comparison, the same numbering system used on the previous MRI.   Alignment: Stable since 2023. Subtle chronic grade 1 anterolisthesis of L3 on L4, mild at L5-S1. Underlying mild S-shaped lumbar scoliosis.   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 New degenerative anterior endplate marrow edema at T12-L1 (series 105, image 8).   Normal background bone marrow signal and maintained vertebral height. Intact visible sacrum and SI joints. No other acute osseous abnormality.   Conus medullaris and cauda equina: Conus extends to the L1 level. No lower spinal cord or conus signal abnormality.   Paraspinal and other soft tissues: Stable, negative Visualized abdominal viscera and paraspinal soft tissues.   Partially visible distended urinary bladder (series 105, image 4).   Disc levels:   Compared to the 2023 MRI:   T11-T12: Stable mild  facet hypertrophy on the right without stenosis.   T12-L1: Progressed anterior disc space loss and new vacuum disc (pronounced vacuum phenomena on the recent CT). Stable mild disc bulging without stenosis.   L1-L2: Interbody ankylosis now. Endplate spurring. Moderate chronic left L1 foraminal stenosis. No spinal stenosis.   L2-L3: Minor disc bulging. Stable mild facet and ligament flavum hypertrophy. No stenosis.   L3-L4: Progressed and now moderate to severe spinal and right lateral recess (right L4 nerve level) stenosis since 2023 in the setting of mild chronic anterolisthesis. Asymmetric disc/pseudo disc to the right with severe ligament flavum hypertrophy. Moderate to severe facet hypertrophy. Decreased facet joint fluid at this level. Moderate left L3 foraminal stenosis also appears mildly progressed.   L4-L5: Chronic disc space loss with increased circumferential disc bulge asymmetric to the right, moderate facet and ligament flavum hypertrophy. Increased mild spinal and moderate right lateral recess stenosis (right L5 nerve level series  106 image 33). Moderate right L4 neural foraminal stenosis also appears increased.   L5-S1: Stable mild disc/pseudo disc bulging. Moderate chronic facet degeneration and degenerative facet joint fluid. The left foraminal synovial cyst is less apparent on series 106, image 33. No convincing stenosis now.   IMPRESSION: 1. 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 - progressed and now moderate to severe multifactorial spinal and right lateral recess stenosis at L3-L4. Progressed moderate left foraminal stenosis. - progressed mild spinal and  moderate multifactorial right lateral recess and right foraminal stenosis at L4-L5.   2. Chronic L5-S1 facet arthropathy, but a left foraminal synovial cysts there seems regressed.   3. Distended urinary bladder.  Query urinary retention.   XRs of the lumbar spine from 03/02/2024 were independently reviewed and  interpreted, showing lumbar scoliotic curvature that measures 17 degrees  with apex to the right.  Disc height loss at L3/4 and L4/5.  No other  significant degenerative changes.  No evidence of instability on  flexion/extension views.  No fracture or dislocation seen.      PATIENT SURVEYS:  PSFS: THE PATIENT SPECIFIC FUNCTIONAL SCALE  Place score of 0-10 (0 = unable to perform activity and 10 = able to perform activity at the same level as before injury or problem)  Activity Date: 05/04/24    Walking with no device  4    2. Standing for a long time (>45 minutes) 2    3. Balance/steadiness in general  3    4.      Total Score 3      Total Score = Sum of activity scores/number of activities  Minimally Detectable Change: 3 points (for single activity); 2 points (for average score)  [facility] (nd). The Patient Specific Functional Scale . Retrieved from [email or web address]   COGNITION: Overall cognitive status: Within functional limits for tasks assessed        LOWER EXTREMITY MMT:  MMT Right eval Left eval  Hip flexion 4 4  Hip extension    Hip abduction 4- seated  4- seated   Hip adduction    Hip internal rotation    Hip external rotation    Knee flexion 4+ 4+  Knee extension 4+ 4+  Ankle dorsiflexion 4+ 4+  Ankle plantarflexion    Ankle inversion    Ankle eversion     (Blank rows = not tested)    FUNCTIONAL TESTS:  5 times sit to stand: 18.5 seconds no UEs  Timed up and go (TUG): 21 seconds no device  3 minute walk test: 423ft no device min guard   GAIT: Distance walked:  444ft Assistive device utilized: None Level of assistance: CGA Comments: more steady at first, got a lot more unsteady as fatigued increased, did need light MinA on turns towards end of test  TREATMENT DATE:  05/18/24:  Recumbent cycle level 4 x 6 min Standing with 1 # cuff wts each ankle, alt hip abd, 20x No UE support Standing alt forward toe taps to 8 surface 20 x with 1# wts ankles no UE support 5 x sit to stand, 15 sec  B Hamstring curls 25# 15 reps x 2 B Knee ext 10# 15 reps x 2 In ll bars for heel toe rocks on airex, 20x In ll bars on rocker board, for for /back rocks 20 x Dribbling 65 cm green physioball one lap in clinic dribbling Leg press B 20# 10x, 30# 10x 2   05/16/24: Therapeutic exercise  and Neuromuscular re education: Nustep level 5 x 6 min Ue's and LE's Lunges on compliant surface BOSU 30x each leg, light R UE support Knee flexion B 25#, 15 x 2  Knee ext B 10# 3 x 10 Standing modified tandem with lead foot on 8 step for horizontal head turns B plantarflexor stretch, with B heel raises forefeet on 3 bar, 30 x Standing tandem, 30 sec holds, each foot once set  B leg press 20# 4 x 10  Standing for alt hip abd with 2# on each leg, 10 reps each  05/11/24: Knee ext B 10#, 3x 10 Knee flexion B 25# 3 x 10 Leg press, in staggered position, terminal knee ext from 30 degrees to full ext , mass practice each leg forward 30# Leg press B LE's seat on 1 for deeper engagement of gluteals 30# 3 x 10    05/09/24:  IN ll bars, heel/ toe rocks on airex In ll bars side stepping with green band around thighs. 6 reps Sit to stand elevated mat 10 reps, no UE support Sit to stand elevated mat with yellow mediball punches 10 reps In ll bars staggered position, lead foot on 8 step, static standing, then horizontal head turns 15x each foot Nustep level 5  x 6 min x 4 ext Knee ext B 10#, 2 x 15 Lateral heel taps, 15x each foot, in ll bars from 2 block    05/04/24  Eval, POC     PATIENT EDUCATION:  Education details: exam findings, POC Person educated: Patient Education method: Programmer, multimedia, Demonstration, and Handouts Education comprehension: verbalized understanding, returned demonstration, and needs further education  HOME EXERCISE PROGRAM: TBD   ASSESSMENT:  CLINICAL IMPRESSION: 05/18/24:  the patient returned today for his fourth physical therapy intervention following his evaluation. We continued with combinations of standing balance challenges and bulk strengthening, added more speed changes and increased resistance for the bulk strength.    No c/o back pain during session.   He should benefit from ongoing strengthening and balance training to improve his activity tolerance and function.    Patient is a 78 y.o. M who was seen today for physical therapy evaluation and treatment for  Diagnosis  R26.89 (ICD-10-CM) - Imbalance  . Arrived late to eval, very pleasant and cooperative but very talkative to so exam was very limited. Will make every effort to address functional concerns and reduce fall risk moving forward.   OBJECTIVE IMPAIRMENTS: Abnormal gait, decreased activity tolerance, decreased balance, decreased coordination, decreased knowledge of use of DME, decreased mobility, difficulty walking, decreased strength, and pain.   ACTIVITY LIMITATIONS: standing, squatting, stairs, transfers, and locomotion level  PARTICIPATION LIMITATIONS: driving, community activity, occupation, and yard work  PERSONAL FACTORS: Age, Behavior pattern, Fitness, Past/current experiences, Social background, and Time since onset of injury/illness/exacerbation are also affecting patient's functional  outcome.   REHAB POTENTIAL: Good  CLINICAL DECISION MAKING: Stable/uncomplicated  EVALUATION COMPLEXITY: Low   GOALS: Goals reviewed with  patient? No  SHORT TERM GOALS: Target date: 06/01/2024    Will be compliant with appropriate progressive HEP  Baseline: Goal status: INITIAL  2.  Will be able to name 3 ways to reduce fall risk at home and in community  Baseline:  Goal status: INITIAL  3.  Will complete TUG in 15 seconds or less no device  Baseline:  Goal status: INITIAL  4.  Will complete 5xSTS in 15 seconds no UEs  Baseline:  Goal status: 8/20 /25: met 15 reps     LONG TERM GOALS: Target date: 06/29/2024    MMT to have improved by at least 1 grade in all weak groups  Baseline:  Goal status: INITIAL  2.  Will score at least 18 on DGI  Baseline:  Goal status: INITIAL  3.  Will able to ambulate in community with no more than baseline LRAD as desired without difficulty  Baseline:  Goal status: INITIAL  4.  Will be able to perform standing tasks for longer than 1 hour Baseline:  Goal status: INITIAL  5.  PSFS to have improved by at least 3 points  Baseline:  Goal status: INITIAL     PLAN:  PT FREQUENCY: 2x/week  PT DURATION: 8 weeks  PLANNED INTERVENTIONS: 97750- Physical Performance Testing, 97110-Therapeutic exercises, 97530- Therapeutic activity, 97112- Neuromuscular re-education, 97535- Self Care, [id number]- Manual therapy, [id number]- Gait training, and DME instructions  PLAN FOR NEXT SESSION: balance, functional activity tolerance, strength- still needs initial HEP   Manraj Yeo, PT, DPT, OCS 05/18/24 11:54 AM

## 2024-05-23 ENCOUNTER — Encounter: Payer: Self-pay | Admitting: Family Medicine

## 2024-05-23 ENCOUNTER — Ambulatory Visit (INDEPENDENT_AMBULATORY_CARE_PROVIDER_SITE_OTHER): Payer: Medicare Other | Admitting: Family Medicine

## 2024-05-23 VITALS — BP 114/70 | HR 80 | Temp 98.0°F | Ht 67.0 in | Wt 170.4 lb

## 2024-05-23 DIAGNOSIS — J3489 Other specified disorders of nose and nasal sinuses: Secondary | ICD-10-CM | POA: Diagnosis not present

## 2024-05-23 DIAGNOSIS — E783 Hyperchylomicronemia: Secondary | ICD-10-CM

## 2024-05-23 LAB — LIPID PANEL
Cholesterol: 112 mg/dL (ref 0–200)
HDL: 42.2 mg/dL (ref >39.00)
LDL Cholesterol: 50 mg/dL (ref 0–99)
NonHDL: 69.42
Total CHOL/HDL Ratio: 3
Triglycerides: 98 mg/dL (ref 0.0–149.0)
VLDL: 19.6 mg/dL (ref 0.0–40.0)

## 2024-05-23 LAB — BASIC METABOLIC PANEL WITH GFR
BUN: 14 mg/dL (ref 6–23)
CO2: 31 meq/L (ref 19–32)
Calcium: 9.3 mg/dL (ref 8.4–10.5)
Chloride: 105 meq/L (ref 96–112)
Creatinine, Ser: 1.14 mg/dL (ref 0.40–1.50)
GFR: 62.4 mL/min (ref >60.00)
Glucose, Bld: 90 mg/dL (ref 70–99)
Potassium: 4.8 meq/L (ref 3.5–5.1)
Sodium: 145 meq/L (ref 135–145)

## 2024-05-23 LAB — CBC WITH DIFFERENTIAL/PLATELET
Basophils Absolute: 0.1 K/uL (ref 0.0–0.1)
Basophils Relative: 1 % (ref 0.0–3.0)
Eosinophils Absolute: 0.3 K/uL (ref 0.0–0.7)
Eosinophils Relative: 3.3 % (ref 0.0–5.0)
HCT: 41.9 % (ref 39.0–52.0)
Hemoglobin: 14 g/dL (ref 13.0–17.0)
Lymphocytes Relative: 30.4 % (ref 12.0–46.0)
Lymphs Abs: 2.3 K/uL (ref 0.7–4.0)
MCHC: 33.4 g/dL (ref 30.0–36.0)
MCV: 90.8 fl (ref 78.0–100.0)
Monocytes Absolute: 0.7 K/uL (ref 0.1–1.0)
Monocytes Relative: 8.8 % (ref 3.0–12.0)
Neutro Abs: 4.3 K/uL (ref 1.4–7.7)
Neutrophils Relative %: 56.5 % (ref 43.0–77.0)
Platelets: 192 K/uL (ref 150.0–400.0)
RBC: 4.62 Mil/uL (ref 4.22–5.81)
RDW: 15.4 % (ref 11.5–15.5)
WBC: 7.5 K/uL (ref 4.0–10.5)

## 2024-05-23 LAB — HEPATIC FUNCTION PANEL
ALT: 16 U/L (ref 0–53)
AST: 20 U/L (ref 0–37)
Albumin: 4.2 g/dL (ref 3.5–5.2)
Alkaline Phosphatase: 72 U/L (ref 39–117)
Bilirubin, Direct: 0.1 mg/dL (ref 0.0–0.3)
Total Bilirubin: 0.5 mg/dL (ref 0.2–1.2)
Total Protein: 7.4 g/dL (ref 6.0–8.3)

## 2024-05-23 MED ORDER — FLUTICASONE PROPIONATE 50 MCG/ACT NA SUSP: 2.0000 | Freq: Every day | NASAL | 6 refills | Status: AC

## 2024-05-23 NOTE — Progress Notes (Signed)
   Subjective:    Patient ID: [name] Short, male    DOB: 21-Dec-1945, 78 y.o.   MRN: [id number]  HPI Hyperlipidemia- chronic problem, on Crestor  40mg  daily.  Denies CP, SOB, abd pain, N/V.  Sinus pressure- pt reports he had 'acute bronchitis' in May and went to ER.  Was tx'd w/ Azithromycin .  Pt reports he has had some episodic issues w/ balance.  Will have L sided facial pain/pressure.  Taking Zyrtec w/ some improvement.  If Zyrtec doesn't help w/ pain/pressure, ibuprofen  does.  Last had pain ~5 days ago   Review of Systems For ROS see HPI     Objective:   Physical Exam Constitutional:      General: He is not in acute distress.    Appearance: He is well-developed.  HENT:     Head: Normocephalic and atraumatic.     Right Ear: Tympanic membrane and ear canal normal.     Left Ear: Tympanic membrane is retracted.     Nose: Septal deviation and congestion present.     Right Sinus: No maxillary sinus tenderness or frontal sinus tenderness.     Left Sinus: No maxillary sinus tenderness or frontal sinus tenderness.  Eyes:     Extraocular Movements: Extraocular movements intact.     Conjunctiva/sclera: Conjunctivae normal.     Pupils: Pupils are equal, round, and reactive to light.  Neck:     Thyroid : No thyromegaly.  Cardiovascular:     Rate and Rhythm: Normal rate and regular rhythm.     Pulses: Normal pulses.     Heart sounds: Normal heart sounds. No murmur heard. Pulmonary:     Effort: Pulmonary effort is normal. No respiratory distress.     Breath sounds: Normal breath sounds.  Abdominal:     General: Bowel sounds are normal. There is no distension.     Palpations: Abdomen is soft.  Musculoskeletal:     Cervical back: Normal range of motion and neck supple.     Right lower leg: No edema.     Left lower leg: No edema.  Lymphadenopathy:     Cervical: No cervical adenopathy.  Skin:    General: Skin is warm and dry.  Neurological:     General: No focal deficit present.      Mental Status: He is alert and oriented to person, place, and time.     Cranial Nerves: No cranial nerve deficit.  Psychiatric:        Mood and Affect: Mood normal.        Behavior: Behavior normal.           Assessment & Plan:   Sinus pressure- new.  No evidence of infxn.  + TM retraction on L and nasal congestion indicate likely untreated allergy inflammation.  Start daily antihistamine and add nasal steroid.  Pt expressed understanding and is in agreement w/ plan.

## 2024-05-23 NOTE — Assessment & Plan Note (Signed)
Chronic problem.  On Crestor 40mg daily w/o difficulty.  Check labs.  Adjust meds prn  

## 2024-05-23 NOTE — Patient Instructions (Signed)
 Follow up in 6 months to recheck cholesterol We'll notify you of your lab results and make any changes if needed CONTINUE the Zyrtec daily ADD Flonase  (OTC or prescription- whichever is cheaper) to help w/ sinus congestion and ear fullness Call with any questions or concerns Stay Safe!  Stay Healthy! Happy Labor Day!!!

## 2024-05-24 ENCOUNTER — Ambulatory Visit: Payer: Self-pay | Admitting: Family Medicine

## 2024-05-24 NOTE — Progress Notes (Signed)
 LVM to call office or via MyChart

## 2024-05-25 ENCOUNTER — Encounter: Payer: Self-pay | Admitting: Physical Therapy

## 2024-05-25 ENCOUNTER — Ambulatory Visit: Admitting: Physical Therapy

## 2024-05-25 DIAGNOSIS — M6281 Muscle weakness (generalized): Secondary | ICD-10-CM | POA: Diagnosis not present

## 2024-05-25 DIAGNOSIS — R296 Repeated falls: Secondary | ICD-10-CM

## 2024-05-25 DIAGNOSIS — R2681 Unsteadiness on feet: Secondary | ICD-10-CM

## 2024-05-25 DIAGNOSIS — R262 Difficulty in walking, not elsewhere classified: Secondary | ICD-10-CM | POA: Diagnosis not present

## 2024-05-25 DIAGNOSIS — R2689 Other abnormalities of gait and mobility: Secondary | ICD-10-CM | POA: Diagnosis not present

## 2024-05-25 NOTE — Progress Notes (Signed)
Lvm to call office about labs. 

## 2024-05-25 NOTE — Therapy (Signed)
 OUTPATIENT PHYSICAL THERAPY LOWER EXTREMITY TREATMENT   Patient Name: [name] Short MRN: [id number] DOB:July 11, 1946, 78 y.o., male Today's Date: 05/25/2024  END OF SESSION:  PT End of Session - 05/25/24 1442     Visit Number 6    Number of Visits 17    Date for PT Re-Evaluation 06/29/24    Authorization Type MCR and BCBS    Authorization Time Period 05/04/24 to 06/29/24    Progress Note Due on Visit 10    PT Start Time 1432    PT Stop Time 1511    PT Time Calculation (min) 39 min    Activity Tolerance Patient tolerated treatment well    Behavior During Therapy WFL for tasks assessed/performed               Past Medical History:  Diagnosis Date   Arthritis    WRIST   Benign localized prostatic hyperplasia with lower urinary tract symptoms (LUTS)    Complication of anesthesia    slow to wake   ED (erectile dysfunction)    GERD (gastroesophageal reflux disease)    History of concussion    AS CHILD--  NO RESIDUAL   History of gout    History of kidney stones    Hyperlipidemia    Inguinal hernia, bilateral    Nephrolithiasis    BILATERAL   PAF (paroxysmal atrial fibrillation) (HCC) currently being followed by pcp   EPISODE --  2011  FOLLOW-UP W/ DR WALL  /  HAS NOT SEEN ANY CARDIOLOGIST SINCE   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 Cindie   Past Surgical History:  Procedure Laterality Date   APPENDECTOMY  1983   CARDIOVASCULAR STRESS TEST  05-08-2010  DR WALL   NO EVIDENCE OF SCAR OR ISCHEMIA/ EF 54%/  PT HAD BOTH AFIB/ AFLUTTER DURING STUDY   CARPAL TUNNEL RELEASE Right 01/ 14/ 2021   CORONARY STENT INTERVENTION N/A 05/02/2022   Procedure: CORONARY STENT INTERVENTION;  Surgeon: Verlin Lonni BIRCH, MD;  Location: [facility];  Service: Cardiovascular;  Laterality: N/A;   CORONARY/GRAFT ACUTE MI REVASCULARIZATION N/A 05/01/2022   Procedure: Coronary/Graft Acute MI Revascularization;   Surgeon: Verlin Lonni BIRCH, MD;  Location: [facility];  Service: Cardiovascular;  Laterality: N/A;   CYSTOSCOPY W/ URETERAL STENT PLACEMENT Left 09/20/2013   Procedure: CYSTOSCOPY WITH STENT REPLACEMENT;  Surgeon: Donnice Gwenyth Brooks, MD;  Location: [facility];  Service: Urology;  Laterality: Left;   CYSTOSCOPY WITH URETEROSCOPY Left 09/20/2013   Procedure: CYSTOSCOPY WITH URETEROSCOPY;  Surgeon: Donnice Gwenyth Brooks, MD;  Location: [facility];  Service: Urology;  Laterality: Left;   CYSTOSCOPY WITH URETEROSCOPY AND STENT PLACEMENT Left 07/12/2013   Procedure: CYSTOSCOPY WITH LITHOLAPEXY, LEFT URETERAL STENT PLACEMENT;  Surgeon: Donnice Gwenyth Brooks, MD;  Location: [facility];  Service: Urology;  Laterality: Left;   CYSTOSCOPY WITH URETEROSCOPY AND STENT PLACEMENT Bilateral 06/23/2014   Procedure: BILATERAL URETEROSCOPY, HOLMIUM LASER LITHOTRIPSY AND STENT PLACEMENT, RIGHT ;  Surgeon: Donnice Brooks, MD;  Location: [facility];  Service: Urology;  Laterality: Bilateral;   EXTRACORPOREAL SHOCK WAVE LITHOTRIPSY  X2   EXTRACORPOREAL SHOCK WAVE LITHOTRIPSY Left 08-29-2013;   07-28-2013   EXTRACORPOREAL SHOCK WAVE LITHOTRIPSY Right 12/09/2018   Procedure: EXTRACORPOREAL SHOCK WAVE LITHOTRIPSY (ESWL);  Surgeon: Brooks Donnice, MD;  Location: [facility];  Service: Urology;  Laterality: Right;   HOLMIUM LASER APPLICATION Left 09/20/2013  Procedure: HOLMIUM LASER APPLICATION;  Surgeon: Donnice Gwenyth Brooks, MD;  Location: [facility];  Service: Urology;  Laterality: Left;   INGUINAL HERNIA REPAIR Bilateral 10/16/2020   Procedure: BILATERAL OPEN INGUINAL HERNIA REPAIR WITH MESH;  Surgeon: Vernetta Berg, MD;  Location: [facility];  Service: General;  Laterality: Bilateral;  LMA/TAP BLOCK   LAPAROSCOPIC INGUINAL HERNIA REPAIR Bilateral 09/04/2010   w/ mesh   LEFT ATRIAL APPENDAGE  OCCLUSION N/A 07/17/2022   Procedure: LEFT ATRIAL APPENDAGE OCCLUSION;  Surgeon: Cindie Ole DASEN, MD;  Location: [facility];  Service: Cardiovascular;  Laterality: N/A;   LEFT HEART CATH AND CORONARY ANGIOGRAPHY N/A 05/01/2022   Procedure: LEFT HEART CATH AND CORONARY ANGIOGRAPHY;  Surgeon: Verlin Lonni BIRCH, MD;  Location: [facility];  Service: Cardiovascular;  Laterality: N/A;   TEE WITHOUT CARDIOVERSION N/A 07/17/2022   Procedure: TRANSESOPHAGEAL ECHOCARDIOGRAM (TEE);  Surgeon: Cindie Ole DASEN, MD;  Location: [facility];  Service: Cardiovascular;  Laterality: N/A;   TONSILLECTOMY  AS CHILD   TOTAL HIP ARTHROPLASTY Left 05/11/2002   TOTAL HIP ARTHROPLASTY Right 11/15/2012   Procedure: RIGHT TOTAL HIP ARTHROPLASTY ANTERIOR APPROACH;  Surgeon: Oneil JAYSON Herald, MD;  Location: [facility];  Service: Orthopedics;  Laterality: Right;  Right total hip arthroplasty   TRANSTHORACIC ECHOCARDIOGRAM  05/08/2010   NORMAL LVSF/  EF 55%/  GRADE I DIASTOLIC DYSFUNCTION/  MILD BILATERAL ATRIUM ENLARGEMENT   Watchemn device  07/07/2022   Patient Active Problem List   Diagnosis Date Noted   Pain of left hand 01/20/2024   Arthritis of carpometacarpal (CMC) joint of left thumb 01/20/2024   Posterior tibial tendonitis 08/06/2022   Presence of Watchman left atrial appendage closure device 07/17/2022   Spinal stenosis of lumbar region 06/04/2022   Hematuria 05/16/2022   Coronary artery disease involving native coronary artery of native heart with unstable angina pectoris (HCC)    STEMI (ST elevation myocardial infarction) (HCC) 05/01/2022   Acute ST elevation myocardial infarction (STEMI) of inferior wall (HCC)    Recurrent kidney stones 08/14/2021   History of cerebrovascular accident (CVA) with residual deficit 02/11/2021   CVA (cerebral vascular accident) (HCC) 10/21/2020   Bilateral primary osteoarthritis of knee 02/15/2020   Acquired trigger finger of right little finger  11/03/2019   Encounter for orthopedic follow-up care 10/13/2019   H/O total hip arthroplasty, bilateral 10/24/2018   Gout 08/18/2017   Overweight (BMI 25.0-29.9) 01/23/2017   Foot pain, left 02/11/2016   Sebaceous cyst 07/14/2014   Sun-damaged skin 07/14/2014   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/14/2014   Physical exam, annual 05/17/2012   Inguinal hernia 08/12/2010   HYPERLIPIDEMIA TYPE I / IV 05/01/2010   Paroxysmal a-fib (HCC) 05/01/2010   ABNORMAL ELECTROCARDIOGRAM 03/26/2010    PCP: Mahlon Crank MD   REFERRING PROVIDER: Georgina Ozell LABOR, MD  REFERRING DIAG:  Diagnosis  R26.89 (ICD-10-CM) - Imbalance    THERAPY DIAG:  Unsteadiness on feet  Repeated falls  Muscle weakness (generalized)  Difficulty in walking, not elsewhere classified  Rationale for Evaluation and Treatment: Rehabilitation  ONSET DATE: early May 2025  SUBJECTIVE:   SUBJECTIVE STATEMENT:   Back is feeling better but its still stiff, I do still get some pain running down the back of my legs but other days there is no pain at all. Balance is getting better.   Eval:Had been to the beach in early may with tennis and dancing and what not, fell on May 9th and then again when  using just a cane. This progressed to not being able to walk period, had a lot of back pain and went the ED, got pain medicine and tx for bronchitis. Eventually ended up getting MRI which led to lumbar injections, this found that there was a disc and nerves being pinched or something. Injections helped a lot but now my main concern is imbalance that has been ongoing but slowly started getting better in past 2 weeks.   PERTINENT HISTORY:  See above  PAIN:  Are you having pain? No 0/10 this afternoon   PRECAUTIONS: Fall  RED FLAGS: None   WEIGHT BEARING RESTRICTIONS: No  FALLS:  Has patient fallen in last 6 months? Yes. 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  DIAGNOSTIC FINDINGS:   CLINICAL DATA:  78 year old male with low back pain radiating down both legs since 02/04/2024. multiple falls.   EXAM: MRI LUMBAR SPINE WITHOUT CONTRAST   TECHNIQUE: Multiplanar, multisequence MR imaging of the lumbar spine was performed. No intravenous contrast was administered.   COMPARISON:  Lumbar spine CT 02/15/2024.  Lumbar MRI 05/13/2022.   FINDINGS: Segmentation: Normal on the comparison, the same numbering system used on the previous MRI.   Alignment: Stable since 2023. Subtle chronic grade 1 anterolisthesis of L3 on L4, mild at L5-S1. Underlying mild S-shaped lumbar scoliosis.   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 New degenerative anterior endplate marrow edema at T12-L1 (series 105, image 8).   Normal background bone marrow signal and maintained vertebral height. Intact visible sacrum and SI joints. No other acute osseous abnormality.   Conus medullaris and cauda equina: Conus extends to the L1 level. No lower spinal cord or conus signal abnormality.   Paraspinal and other soft tissues: Stable, negative Visualized abdominal viscera and paraspinal soft tissues.   Partially visible distended urinary bladder (series 105, image 4).   Disc levels:   Compared to the 2023 MRI:   T11-T12: Stable mild facet hypertrophy on the right  without stenosis.   T12-L1: Progressed anterior disc space loss and new vacuum disc (pronounced vacuum phenomena on the recent CT). Stable mild disc bulging without stenosis.   L1-L2: Interbody ankylosis now. Endplate spurring. Moderate chronic left L1 foraminal stenosis. No spinal stenosis.   L2-L3: Minor disc bulging. Stable mild facet and ligament flavum hypertrophy. No stenosis.   L3-L4: Progressed and now moderate to severe spinal and right lateral recess (right L4 nerve level) stenosis since 2023 in the setting of mild chronic anterolisthesis. Asymmetric disc/pseudo disc to the right with severe ligament flavum hypertrophy. Moderate to severe facet hypertrophy. Decreased facet joint fluid at this level. Moderate left L3 foraminal stenosis also appears mildly progressed.   L4-L5: Chronic disc space loss with increased circumferential disc bulge asymmetric to the right, moderate facet and ligament flavum hypertrophy. Increased mild spinal and moderate right lateral recess stenosis (right L5  nerve level series 106 image 33). Moderate right L4 neural foraminal stenosis also appears increased.   L5-S1: Stable mild disc/pseudo disc bulging. Moderate chronic facet degeneration and degenerative facet joint fluid. The left foraminal synovial cyst is less apparent on series 106, image 33. No convincing stenosis now.   IMPRESSION: 1. 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 - progressed and now moderate to severe multifactorial spinal and right lateral recess stenosis at L3-L4. Progressed moderate left foraminal stenosis. - progressed mild spinal and moderate multifactorial right  lateral recess and right foraminal stenosis at L4-L5.   2. Chronic L5-S1 facet arthropathy, but a left foraminal synovial cysts there seems regressed.   3. Distended urinary bladder.  Query urinary retention.   XRs of the lumbar spine from 03/02/2024 were independently reviewed and  interpreted, showing lumbar scoliotic curvature that measures 17 degrees  with apex to the right.  Disc height loss at L3/4 and L4/5.  No other  significant degenerative changes.  No evidence of instability on  flexion/extension views.  No fracture or dislocation seen.      PATIENT SURVEYS:  PSFS: THE PATIENT SPECIFIC FUNCTIONAL SCALE  Place score of 0-10 (0 = unable to perform activity and 10 = able to perform activity at the same level as before injury or problem)  Activity Date: 05/04/24    Walking with no device  4    2. Standing for a long time (>45 minutes) 2    3. Balance/steadiness in general  3    4.      Total Score 3      Total Score = Sum of activity scores/number of activities  Minimally Detectable Change: 3 points (for single activity); 2 points (for average score)  [facility] (nd). The Patient Specific Functional Scale . Retrieved from [email or web address]   COGNITION: Overall cognitive status: Within functional limits for tasks assessed        LOWER EXTREMITY MMT:  MMT Right eval Left eval  Hip flexion 4 4  Hip extension    Hip abduction 4- seated  4- seated   Hip adduction    Hip internal rotation    Hip external rotation    Knee flexion 4+ 4+  Knee extension 4+ 4+  Ankle dorsiflexion 4+ 4+  Ankle plantarflexion    Ankle inversion    Ankle eversion     (Blank rows = not tested)    FUNCTIONAL TESTS:  5 times sit to stand: 18.5 seconds no UEs; 05/25/24  13.8 seconds no UEs  Timed up and go (TUG): 21 seconds no device; 05/25/24 11.4 seconds   3 minute walk test: 457ft no device min guard    GAIT: Distance walked: 474ft Assistive device utilized: None Level of assistance: CGA Comments: more steady at first, got a lot more unsteady as fatigued increased, did need light MinA on turns towards end of test  TREATMENT DATE:    05/25/24  Scifit bike L4x8 minutes for w/u  Tandem stance 3x30 seconds B solid surface   5xSTS, TUG, goals, education on HEP   Lumbar rotation stretch 6x3 seconds  SKTC 5x5 seconds B Bridges x10      05/18/24:  Recumbent cycle level 4 x 6 min Standing with 1 # cuff wts each ankle, alt hip abd, 20x No UE support Standing alt forward toe taps to 8 surface 20 x with 1# wts ankles no UE support 5 x sit to stand, 15 sec  B Hamstring curls 25# 15 reps x 2 B Knee ext 10# 15 reps x 2 In ll bars for heel toe rocks on airex, 20x In ll bars on rocker board, for for /back rocks 20 x Dribbling 65 cm green physioball one lap in clinic dribbling Leg press B 20# 10x, 30# 10x 2     PATIENT EDUCATION:  Education details: exam findings, POC Person educated: Patient Education method: Explanation, Demonstration, and Handouts Education comprehension: verbalized understanding, returned demonstration, and needs further education  HOME EXERCISE PROGRAM:  Access Code: [email or web address] URL: [email or web address] Date: 05/25/2024 Prepared by: Josette Rough  Exercises - Supine Lower Trunk Rotation  - 2 x daily - 7 x weekly - 1 sets - 10 reps - 5 seconds  hold - Supine Single Knee to Chest  - 2 x daily - 7 x weekly - 1 sets - 10 reps - 5 seconds  hold - Supine Bridge  - 2 x daily - 5 x weekly - 1 sets - 10 reps - Seated Quadratus Lumborum Stretch in Chair  - 2 x daily - 7 x weekly - 1 sets - 6 reps - 30 seconds  hold - Standing Tandem Balance with Counter Support  - 1 x daily - 7 x weekly - 1 sets - 6 reps - 30 seconds   hold   ASSESSMENT:  CLINICAL IMPRESSION:    Arrives today doing well, sounds like he is feeling better but not where he'd like to be pain wise or functionally just yet. Updated goals/some objectives, otherwise focused on developing HEP to address ongoing lumbar stiffness and some proximal strength at home, also incorporated some balance. Will continue to challenge him.     EVAL: Patient is a 78 y.o. M who was seen today for physical therapy evaluation and treatment for  Diagnosis  R26.89 (ICD-10-CM) - Imbalance  . Arrived late to eval, very pleasant and cooperative but very talkative to so exam was very limited. Will make every effort to address functional concerns and reduce fall risk moving forward.   OBJECTIVE IMPAIRMENTS: Abnormal gait, decreased activity tolerance, decreased balance, decreased coordination, decreased knowledge of use of DME, decreased mobility, difficulty walking, decreased strength, and pain.   ACTIVITY LIMITATIONS: standing, squatting, stairs, transfers, and locomotion level  PARTICIPATION LIMITATIONS: driving, community activity, occupation, and yard work  PERSONAL FACTORS: Age, Behavior pattern, Fitness, Past/current experiences, Social background, and Time since onset of injury/illness/exacerbation are also affecting patient's functional outcome.   REHAB POTENTIAL: Good  CLINICAL DECISION MAKING: Stable/uncomplicated  EVALUATION COMPLEXITY: Low   GOALS: Goals reviewed with patient? No  SHORT TERM GOALS: Target date: 06/01/2024    Will be compliant with appropriate progressive HEP  Baseline: Goal status: ONGOING 05/25/24 assigned initial program   2.  Will be able to name 3 ways to reduce fall risk at home and in community  Baseline:  Goal status: ONGOING 05/25/24  3.  Will complete TUG  in 15 seconds or less no device  Baseline:  Goal status: MET 05/25/24  4.  Will complete 5xSTS in 15 seconds no UEs  Baseline:  Goal status: MET  05/25/24    LONG TERM GOALS: Target date: 06/29/2024    MMT to have improved by at least 1 grade in all weak groups  Baseline:  Goal status: INITIAL  2.  Will score at least 18 on DGI  Baseline:  Goal status: INITIAL  3.  Will able to ambulate in community with no more than baseline LRAD as desired without difficulty  Baseline:  Goal status: INITIAL  4.  Will be able to perform standing tasks for longer than 1 hour Baseline:  Goal status: INITIAL  5.  PSFS to have improved by at least 3 points  Baseline:  Goal status: INITIAL     PLAN:  PT FREQUENCY: 2x/week  PT DURATION: 8 weeks  PLANNED INTERVENTIONS: [id number]- Physical Performance Testing, 97110-Therapeutic exercises, 97530- Therapeutic activity, [id number]- Neuromuscular re-education, 97535- Self Care, [id number]- Manual therapy, [id number]- Gait training, and DME instructions  PLAN FOR NEXT SESSION: balance, functional activity tolerance, strength; how does HEP feel?   Josette Rough, PT, DPT 05/25/24 3:12 PM

## 2024-05-27 ENCOUNTER — Ambulatory Visit: Admitting: Physical Therapy

## 2024-05-27 ENCOUNTER — Encounter: Payer: Self-pay | Admitting: Physical Therapy

## 2024-05-27 DIAGNOSIS — R2681 Unsteadiness on feet: Secondary | ICD-10-CM | POA: Diagnosis not present

## 2024-05-27 DIAGNOSIS — R296 Repeated falls: Secondary | ICD-10-CM | POA: Diagnosis not present

## 2024-05-27 DIAGNOSIS — M6281 Muscle weakness (generalized): Secondary | ICD-10-CM

## 2024-05-27 DIAGNOSIS — R2689 Other abnormalities of gait and mobility: Secondary | ICD-10-CM | POA: Diagnosis not present

## 2024-05-27 DIAGNOSIS — R262 Difficulty in walking, not elsewhere classified: Secondary | ICD-10-CM

## 2024-05-27 NOTE — Therapy (Signed)
 OUTPATIENT PHYSICAL THERAPY LOWER EXTREMITY TREATMENT   Patient Name: [name] Short MRN: [id number] DOB:05-28-1946, 78 y.o., male Today's Date: 05/27/2024  END OF SESSION:  PT End of Session - 05/27/24 1059     Visit Number 7    Date for PT Re-Evaluation 06/29/24    PT Start Time 1100    PT Stop Time 1145    PT Time Calculation (min) 45 min    Activity Tolerance Patient tolerated treatment well    Behavior During Therapy WFL for tasks assessed/performed               Past Medical History:  Diagnosis Date   Arthritis    WRIST   Benign localized prostatic hyperplasia with lower urinary tract symptoms (LUTS)    Complication of anesthesia    slow to wake   ED (erectile dysfunction)    GERD (gastroesophageal reflux disease)    History of concussion    AS CHILD--  NO RESIDUAL   History of gout    History of kidney stones    Hyperlipidemia    Inguinal hernia, bilateral    Nephrolithiasis    BILATERAL   PAF (paroxysmal atrial fibrillation) (HCC) currently being followed by pcp   EPISODE --  2011  FOLLOW-UP W/ DR WALL  /  HAS NOT SEEN ANY CARDIOLOGIST SINCE   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 Cindie   Past Surgical History:  Procedure Laterality Date   APPENDECTOMY  1983   CARDIOVASCULAR STRESS TEST  05-08-2010  DR WALL   NO EVIDENCE OF SCAR OR ISCHEMIA/ EF 54%/  PT HAD BOTH AFIB/ AFLUTTER DURING STUDY   CARPAL TUNNEL RELEASE Right 01/ 14/ 2021   CORONARY STENT INTERVENTION N/A 05/02/2022   Procedure: CORONARY STENT INTERVENTION;  Surgeon: Verlin Lonni BIRCH, MD;  Location: [facility];  Service: Cardiovascular;  Laterality: N/A;   CORONARY/GRAFT ACUTE MI REVASCULARIZATION N/A 05/01/2022   Procedure: Coronary/Graft Acute MI Revascularization;  Surgeon: Verlin Lonni BIRCH, MD;  Location: [facility];  Service: Cardiovascular;  Laterality: N/A;   CYSTOSCOPY W/ URETERAL STENT  PLACEMENT Left 09/20/2013   Procedure: CYSTOSCOPY WITH STENT REPLACEMENT;  Surgeon: Donnice Gwenyth Brooks, MD;  Location: [facility];  Service: Urology;  Laterality: Left;   CYSTOSCOPY WITH URETEROSCOPY Left 09/20/2013   Procedure: CYSTOSCOPY WITH URETEROSCOPY;  Surgeon: Donnice Gwenyth Brooks, MD;  Location: [facility];  Service: Urology;  Laterality: Left;   CYSTOSCOPY WITH URETEROSCOPY AND STENT PLACEMENT Left 07/12/2013   Procedure: CYSTOSCOPY WITH LITHOLAPEXY, LEFT URETERAL STENT PLACEMENT;  Surgeon: Donnice Gwenyth Brooks, MD;  Location: [facility];  Service: Urology;  Laterality: Left;   CYSTOSCOPY WITH URETEROSCOPY AND STENT PLACEMENT Bilateral 06/23/2014   Procedure: BILATERAL URETEROSCOPY, HOLMIUM LASER LITHOTRIPSY AND STENT PLACEMENT, RIGHT ;  Surgeon: Donnice Brooks, MD;  Location: [facility];  Service: Urology;  Laterality: Bilateral;   EXTRACORPOREAL SHOCK WAVE LITHOTRIPSY  X2   EXTRACORPOREAL SHOCK WAVE LITHOTRIPSY Left 08-29-2013;   07-28-2013   EXTRACORPOREAL SHOCK WAVE LITHOTRIPSY Right 12/09/2018   Procedure: EXTRACORPOREAL SHOCK WAVE LITHOTRIPSY (ESWL);  Surgeon: Brooks Donnice, MD;  Location: [facility];  Service: Urology;  Laterality: Right;   HOLMIUM LASER APPLICATION Left 09/20/2013   Procedure: HOLMIUM LASER APPLICATION;  Surgeon: Donnice Gwenyth Brooks, MD;  Location[facility];  Service: Urology;  Laterality: Left;   INGUINAL HERNIA REPAIR Bilateral 10/16/2020   Procedure: BILATERAL  OPEN INGUINAL HERNIA REPAIR WITH MESH;  Surgeon: Vernetta Berg, MD;  Location: [facility];  Service: General;  Laterality: Bilateral;  LMA/TAP BLOCK   LAPAROSCOPIC INGUINAL HERNIA REPAIR Bilateral 09/04/2010   w/ mesh   LEFT ATRIAL APPENDAGE OCCLUSION N/A 07/17/2022   Procedure: LEFT ATRIAL APPENDAGE OCCLUSION;  Surgeon: Cindie Ole DASEN, MD;  Location: [facility];  Service:  Cardiovascular;  Laterality: N/A;   LEFT HEART CATH AND CORONARY ANGIOGRAPHY N/A 05/01/2022   Procedure: LEFT HEART CATH AND CORONARY ANGIOGRAPHY;  Surgeon: Verlin Lonni BIRCH, MD;  Location: [facility];  Service: Cardiovascular;  Laterality: N/A;   TEE WITHOUT CARDIOVERSION N/A 07/17/2022   Procedure: TRANSESOPHAGEAL ECHOCARDIOGRAM (TEE);  Surgeon: Cindie Ole DASEN, MD;  Location: [facility];  Service: Cardiovascular;  Laterality: N/A;   TONSILLECTOMY  AS CHILD   TOTAL HIP ARTHROPLASTY Left 05/11/2002   TOTAL HIP ARTHROPLASTY Right 11/15/2012   Procedure: RIGHT TOTAL HIP ARTHROPLASTY ANTERIOR APPROACH;  Surgeon: Oneil JAYSON Herald, MD;  Location: [facility];  Service: Orthopedics;  Laterality: Right;  Right total hip arthroplasty   TRANSTHORACIC ECHOCARDIOGRAM  05/08/2010   NORMAL LVSF/  EF 55%/  GRADE I DIASTOLIC DYSFUNCTION/  MILD BILATERAL ATRIUM ENLARGEMENT   Watchemn device  07/07/2022   Patient Active Problem List   Diagnosis Date Noted   Pain of left hand 01/20/2024   Arthritis of carpometacarpal (CMC) joint of left thumb 01/20/2024   Posterior tibial tendonitis 08/06/2022   Presence of Watchman left atrial appendage closure device 07/17/2022   Spinal stenosis of lumbar region 06/04/2022   Hematuria 05/16/2022   Coronary artery disease involving native coronary artery of native heart with unstable angina pectoris (HCC)    STEMI (ST elevation myocardial infarction) (HCC) 05/01/2022   Acute ST elevation myocardial infarction (STEMI) of inferior wall (HCC)    Recurrent kidney stones 08/14/2021   History of cerebrovascular accident (CVA) with residual deficit 02/11/2021   CVA (cerebral vascular accident) (HCC) 10/21/2020   Bilateral primary osteoarthritis of knee 02/15/2020   Acquired trigger finger of right little finger 11/03/2019   Encounter for orthopedic follow-up care 10/13/2019   H/O total hip arthroplasty, bilateral 10/24/2018   Gout 08/18/2017   Overweight  (BMI 25.0-29.9) 01/23/2017   Foot pain, left 02/11/2016   Sebaceous cyst 07/14/2014   Sun-damaged skin 07/14/2014   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/14/2014   Physical exam, annual 05/17/2012   Inguinal hernia 08/12/2010   HYPERLIPIDEMIA TYPE I / IV 05/01/2010   Paroxysmal a-fib (HCC) 05/01/2010   ABNORMAL ELECTROCARDIOGRAM 03/26/2010    PCP: Mahlon Crank MD   REFERRING PROVIDER: Georgina Ozell LABOR, MD  REFERRING DIAG:  Diagnosis  R26.89 (ICD-10-CM) - Imbalance    THERAPY DIAG:  Unsteadiness on feet  Repeated falls  Muscle weakness (generalized)  Difficulty in walking, not elsewhere classified  Rationale for Evaluation and Treatment: Rehabilitation  ONSET DATE: early May 2025  SUBJECTIVE:   SUBJECTIVE STATEMENT:   Going pretty good Ok today   Eval:Had been to [employer] in early may with tennis and dancing and what not, fell on May 9th and then again when using just a cane. This progressed to not being able to walk period, had a lot of back pain and went the ED, got pain medicine and tx for bronchitis. Eventually ended up getting MRI which led to lumbar injections, this found that there was a disc and nerves being pinched or something. Injections helped a lot but now my main concern  is imbalance that has been ongoing but slowly started getting better in past 2 weeks.   PERTINENT HISTORY:  See above  PAIN:  Are you having pain? 1-2/10 this afternoon   PRECAUTIONS: Fall  RED FLAGS: None   WEIGHT BEARING RESTRICTIONS: No  FALLS:  Has patient fallen in last 6 months? Yes. 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  DIAGNOSTIC FINDINGS:   CLINICAL DATA:  78 year old male with low back pain radiating down both legs since 02/04/2024. multiple falls.   EXAM: MRI LUMBAR SPINE WITHOUT CONTRAST   TECHNIQUE: Multiplanar, multisequence MR imaging of the lumbar spine was performed. No intravenous contrast was administered.   COMPARISON:  Lumbar spine CT 02/15/2024.  Lumbar MRI 05/13/2022.   FINDINGS: Segmentation: Normal on the comparison, the same numbering system used on the previous MRI.   Alignment: Stable since 2023. Subtle chronic grade 1 anterolisthesis of L3 on L4, mild at L5-S1. Underlying mild S-shaped lumbar scoliosis.   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 New degenerative anterior endplate marrow edema at T12-L1 (series 105, image 8).   Normal background bone marrow signal and maintained vertebral height. Intact visible sacrum and SI joints. No other acute osseous abnormality.   Conus medullaris and cauda equina: Conus extends to the L1 level. No lower spinal cord or conus signal abnormality.   Paraspinal and other soft tissues: Stable, negative Visualized abdominal viscera and paraspinal soft tissues.   Partially visible distended urinary bladder (series 105, image 4).   Disc levels:   Compared to the 2023 MRI:   T11-T12: Stable mild facet hypertrophy on the right without stenosis.   T12-L1: Progressed anterior disc space loss and new vacuum disc (pronounced vacuum phenomena on the recent CT). Stable mild disc bulging without stenosis.   L1-L2: Interbody ankylosis now. Endplate spurring. Moderate chronic left L1 foraminal stenosis. No spinal stenosis.    L2-L3: Minor disc bulging. Stable mild facet and ligament flavum hypertrophy. No stenosis.   L3-L4: Progressed and now moderate to severe spinal and right lateral recess (right L4 nerve level) stenosis since 2023 in the setting of mild chronic anterolisthesis. Asymmetric disc/pseudo disc to the right with severe ligament flavum hypertrophy. Moderate to severe facet hypertrophy. Decreased facet joint fluid at this level. Moderate left L3 foraminal stenosis also appears mildly progressed.   L4-L5: Chronic disc space loss with increased circumferential disc bulge asymmetric to the right, moderate facet and ligament flavum hypertrophy. Increased mild spinal and moderate right lateral recess stenosis (right L5 nerve level series 106 image 33). Moderate right L4 neural foraminal stenosis also appears increased.   L5-S1: Stable mild disc/pseudo disc bulging. Moderate chronic facet degeneration and degenerative facet joint fluid. The left foraminal synovial cyst is less apparent on series 106, image 33. No convincing stenosis now.   IMPRESSION: 1. 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 - progressed and now moderate to severe multifactorial spinal and right lateral recess stenosis at L3-L4. Progressed moderate left foraminal stenosis. - progressed mild spinal and moderate multifactorial right lateral recess and right foraminal stenosis at L4-L5.   2. Chronic L5-S1 facet arthropathy, but a left foraminal synovial cysts there seems regressed.   3. Distended urinary bladder.  Query urinary retention.   XRs of the lumbar spine from 03/02/2024 were independently reviewed and  interpreted,  showing lumbar scoliotic curvature that measures 17 degrees  with apex to the right.  Disc height loss at L3/4 and L4/5.  No other  significant degenerative changes.  No evidence of instability on  flexion/extension views.  No fracture or dislocation seen.      PATIENT SURVEYS:  PSFS: THE PATIENT SPECIFIC FUNCTIONAL SCALE  Place score of 0-10 (0 = unable to perform activity and 10 = able to perform activity at the same level as before injury or problem)  Activity Date: 05/04/24    Walking with no device  4    2. Standing for a long time (>45 minutes) 2    3. Balance/steadiness in general  3    4.      Total Score 3      Total Score = Sum of activity scores/number of activities  Minimally Detectable Change: 3 points (for single activity); 2 points (for average score)  [facility] (nd). The Patient Specific Functional Scale . Retrieved from [email or web address]   COGNITION: Overall cognitive status: Within functional limits for tasks assessed        LOWER EXTREMITY MMT:  MMT Right eval Left eval  Hip flexion 4 4  Hip extension    Hip abduction 4- seated  4- seated   Hip adduction    Hip internal rotation    Hip external rotation    Knee flexion 4+ 4+  Knee extension 4+ 4+  Ankle dorsiflexion 4+ 4+  Ankle plantarflexion    Ankle inversion    Ankle eversion     (Blank rows = not tested)    FUNCTIONAL TESTS:  5 times sit to stand: 18.5 seconds no UEs; 05/25/24  13.8 seconds no UEs  Timed up and go (TUG): 21 seconds no device; 05/25/24 11.4 seconds   3 minute walk test: 4108ft no device min guard   GAIT: Distance walked: 466ft Assistive device utilized: None Level of assistance: CGA Comments: more steady at first, got a lot more unsteady as fatigued increased, did need light MinA on turns towards end of test  TREATMENT DATE:  05/27/24 NuStep L 5 x 6 min Alt 6in box taps 2x10 Sit to stand on airex 2x10  Side steps on an off airex   On airex ball toss  6in step ups x10 each HS curls 25lb 2x10 Leg Ext 10lb 2x10  05/25/24  Scifit bike L4x8 minutes for w/u  Tandem stance 3x30 seconds B solid surface   5xSTS, TUG, goals, education on HEP   Lumbar rotation stretch 6x3 seconds  SKTC 5x5 seconds B Bridges x10      05/18/24:  Recumbent cycle level 4 x 6 min Standing with 1 # cuff wts each ankle, alt hip abd, 20x No UE support Standing alt forward toe taps to 8 surface 20 x with 1# wts ankles no UE support 5 x sit to stand, 15 sec  B Hamstring curls 25# 15 reps x 2 B Knee ext 10# 15 reps x 2 In ll bars for heel toe rocks on airex, 20x In ll bars on rocker board, for for /back rocks 20 x Dribbling 65 cm green physioball one lap in clinic dribbling Leg press B 20# 10x, 30# 10x 2     PATIENT EDUCATION:  Education details: exam findings, POC Person educated: Patient Education method: Explanation, Demonstration, and Handouts Education comprehension: verbalized understanding, returned demonstration, and needs further education  HOME EXERCISE PROGRAM:  Access Code: [email or web address] URL: [email or web address] Date: 05/25/2024 Prepared by: Josette Rough  Exercises - Supine Lower Trunk Rotation  - 2 x daily - 7 x weekly - 1 sets - 10 reps - 5 seconds  hold - Supine Single Knee to Chest  - 2 x daily - 7 x weekly - 1 sets - 10 reps - 5 seconds  hold - Supine Bridge  - 2 x daily - 5 x weekly - 1 sets - 10 reps - Seated Quadratus Lumborum Stretch in Chair  - 2 x daily - 7 x weekly - 1 sets - 6 reps - 30 seconds  hold - Standing Tandem Balance with Counter Support  - 1 x daily - 7 x weekly - 1 sets - 6 reps - 30 seconds  hold   ASSESSMENT:  CLINICAL IMPRESSION:    Arrives today doing well with little pain. Time spent challenging his  balance and functional strength. Instability with step ups and alt taps. Good righting reaction with ball toss standing on airex. Cue for full ROM needed with curls and ext. Will continue to challenge him.     EVAL: Patient is a 78 y.o. M who was seen today for physical therapy evaluation and treatment for  Diagnosis  R26.89 (ICD-10-CM) - Imbalance  . Arrived late to eval, very pleasant and cooperative but very talkative to so exam was very limited. Will make every effort to address functional concerns and reduce fall risk moving forward.   OBJECTIVE IMPAIRMENTS: Abnormal gait, decreased activity tolerance, decreased balance, decreased coordination, decreased knowledge of use of DME, decreased mobility, difficulty walking, decreased strength, and pain.   ACTIVITY LIMITATIONS: standing, squatting, stairs, transfers, and locomotion level  PARTICIPATION LIMITATIONS: driving, community activity, occupation, and yard work  PERSONAL FACTORS: Age, Behavior pattern, Fitness, Past/current experiences, Social background, and Time since onset of injury/illness/exacerbation are also affecting patient's functional outcome.   REHAB POTENTIAL: Good  CLINICAL DECISION MAKING: Stable/uncomplicated  EVALUATION COMPLEXITY: Low   GOALS: Goals reviewed with patient? No  SHORT TERM GOALS: Target date: 06/01/2024    Will be compliant with appropriate progressive HEP  Baseline: Goal status:  ONGOING 05/25/24 assigned initial program   2.  Will be able to name 3 ways to reduce fall risk at home and in community  Baseline:  Goal status: ONGOING 05/25/24  3.  Will complete TUG in 15 seconds or less no device  Baseline:  Goal status: MET 05/25/24  4.  Will complete 5xSTS in 15 seconds no UEs  Baseline:  Goal status: MET 05/25/24    LONG TERM GOALS: Target date: 06/29/2024    MMT to have improved by at least 1 grade in all weak groups  Baseline:  Goal status: INITIAL  2.  Will score at least 18 on  DGI  Baseline:  Goal status: INITIAL  3.  Will able to ambulate in community with no more than baseline LRAD as desired without difficulty  Baseline:  Goal status: INITIAL  4.  Will be able to perform standing tasks for longer than 1 hour Baseline:  Goal status: INITIAL  5.  PSFS to have improved by at least 3 points  Baseline:  Goal status: INITIAL     PLAN:  PT FREQUENCY: 2x/week  PT DURATION: 8 weeks  PLANNED INTERVENTIONS: [id number]- Physical Performance Testing, 97110-Therapeutic exercises, 97530- Therapeutic activity, [id number]- Neuromuscular re-education, 97535- Self Care, [id number]- Manual therapy, [id number]- Gait training, and DME instructions  PLAN FOR NEXT SESSION: balance, functional activity tolerance, strength; how does HEP feel?   Tanda Sorrow, PTA 05/27/24 10:59 AM

## 2024-05-31 ENCOUNTER — Encounter: Payer: Self-pay | Admitting: Physical Therapy

## 2024-05-31 ENCOUNTER — Ambulatory Visit: Attending: Orthopedic Surgery | Admitting: Physical Therapy

## 2024-05-31 DIAGNOSIS — R262 Difficulty in walking, not elsewhere classified: Secondary | ICD-10-CM | POA: Insufficient documentation

## 2024-05-31 DIAGNOSIS — R2681 Unsteadiness on feet: Secondary | ICD-10-CM | POA: Insufficient documentation

## 2024-05-31 DIAGNOSIS — R296 Repeated falls: Secondary | ICD-10-CM | POA: Insufficient documentation

## 2024-05-31 DIAGNOSIS — M6281 Muscle weakness (generalized): Secondary | ICD-10-CM | POA: Insufficient documentation

## 2024-05-31 NOTE — Therapy (Signed)
 OUTPATIENT PHYSICAL THERAPY LOWER EXTREMITY TREATMENT   Patient Name: [name] Short MRN: [id number] DOB:03-07-1946, 78 y.o., male Today's Date: 05/31/2024  END OF SESSION:  PT End of Session - 05/31/24 1427     Visit Number 8    Date for PT Re-Evaluation 06/29/24    PT Start Time 1430    PT Stop Time 1515    PT Time Calculation (min) 45 min    Activity Tolerance Patient tolerated treatment well    Behavior During Therapy WFL for tasks assessed/performed               Past Medical History:  Diagnosis Date   Arthritis    WRIST   Benign localized prostatic hyperplasia with lower urinary tract symptoms (LUTS)    Complication of anesthesia    slow to wake   ED (erectile dysfunction)    GERD (gastroesophageal reflux disease)    History of concussion    AS CHILD--  NO RESIDUAL   History of gout    History of kidney stones    Hyperlipidemia    Inguinal hernia, bilateral    Nephrolithiasis    BILATERAL   PAF (paroxysmal atrial fibrillation) (HCC) currently being followed by pcp   EPISODE --  2011  FOLLOW-UP W/ DR WALL  /  HAS NOT SEEN ANY CARDIOLOGIST SINCE   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 Cindie   Past Surgical History:  Procedure Laterality Date   APPENDECTOMY  1983   CARDIOVASCULAR STRESS TEST  05-08-2010  DR WALL   NO EVIDENCE OF SCAR OR ISCHEMIA/ EF 54%/  PT HAD BOTH AFIB/ AFLUTTER DURING STUDY   CARPAL TUNNEL RELEASE Right 01/ 14/ 2021   CORONARY STENT INTERVENTION N/A 05/02/2022   Procedure: CORONARY STENT INTERVENTION;  Surgeon: Verlin Lonni BIRCH, MD;  Location: [facility];  Service: Cardiovascular;  Laterality: N/A;   CORONARY/GRAFT ACUTE MI REVASCULARIZATION N/A 05/01/2022   Procedure: Coronary/Graft Acute MI Revascularization;  Surgeon: Verlin Lonni BIRCH, MD;  Location: [facility];  Service: Cardiovascular;  Laterality: N/A;   CYSTOSCOPY W/ URETERAL STENT  PLACEMENT Left 09/20/2013   Procedure: CYSTOSCOPY WITH STENT REPLACEMENT;  Surgeon: Donnice Gwenyth Brooks, MD;  Location: [facility];  Service: Urology;  Laterality: Left;   CYSTOSCOPY WITH URETEROSCOPY Left 09/20/2013   Procedure: CYSTOSCOPY WITH URETEROSCOPY;  Surgeon: Donnice Gwenyth Brooks, MD;  Location: [facility];  Service: Urology;  Laterality: Left;   CYSTOSCOPY WITH URETEROSCOPY AND STENT PLACEMENT Left 07/12/2013   Procedure: CYSTOSCOPY WITH LITHOLAPEXY, LEFT URETERAL STENT PLACEMENT;  Surgeon: Donnice Gwenyth Brooks, MD;  Location: [facility];  Service: Urology;  Laterality: Left;   CYSTOSCOPY WITH URETEROSCOPY AND STENT PLACEMENT Bilateral 06/23/2014   Procedure: BILATERAL URETEROSCOPY, HOLMIUM LASER LITHOTRIPSY AND STENT PLACEMENT, RIGHT ;  Surgeon: Donnice Brooks, MD;  Location: [facility];  Service: Urology;  Laterality: Bilateral;   EXTRACORPOREAL SHOCK WAVE LITHOTRIPSY  X2   EXTRACORPOREAL SHOCK WAVE LITHOTRIPSY Left 08-29-2013;   07-28-2013   EXTRACORPOREAL SHOCK WAVE LITHOTRIPSY Right 12/09/2018   Procedure: EXTRACORPOREAL SHOCK WAVE LITHOTRIPSY (ESWL);  Surgeon: Brooks Donnice, MD;  Location: [facility];  Service: Urology;  Laterality: Right;   HOLMIUM LASER APPLICATION Left 09/20/2013   Procedure: HOLMIUM LASER APPLICATION;  Surgeon: Donnice Gwenyth Brooks, MD;  Location: [facility];  Service: Urology;  Laterality: Left;   INGUINAL HERNIA REPAIR Bilateral 10/16/2020   Procedure: BILATERAL  OPEN INGUINAL HERNIA REPAIR WITH MESH;  Surgeon: Vernetta Berg, MD;  Location: [facility];  Service: General;  Laterality: Bilateral;  LMA/TAP BLOCK   LAPAROSCOPIC INGUINAL HERNIA REPAIR Bilateral 09/04/2010   w/ mesh   LEFT ATRIAL APPENDAGE OCCLUSION N/A 07/17/2022   Procedure: LEFT ATRIAL APPENDAGE OCCLUSION;  Surgeon: Cindie Ole DASEN, MD;  Location: [facility];  Service:  Cardiovascular;  Laterality: N/A;   LEFT HEART CATH AND CORONARY ANGIOGRAPHY N/A 05/01/2022   Procedure: LEFT HEART CATH AND CORONARY ANGIOGRAPHY;  Surgeon: Verlin Lonni BIRCH, MD;  Location: [facility];  Service: Cardiovascular;  Laterality: N/A;   TEE WITHOUT CARDIOVERSION N/A 07/17/2022   Procedure: TRANSESOPHAGEAL ECHOCARDIOGRAM (TEE);  Surgeon: Cindie Ole DASEN, MD;  Location: [facility];  Service: Cardiovascular;  Laterality: N/A;   TONSILLECTOMY  AS CHILD   TOTAL HIP ARTHROPLASTY Left 05/11/2002   TOTAL HIP ARTHROPLASTY Right 11/15/2012   Procedure: RIGHT TOTAL HIP ARTHROPLASTY ANTERIOR APPROACH;  Surgeon: Oneil JAYSON Herald, MD;  Location: [facility];  Service: Orthopedics;  Laterality: Right;  Right total hip arthroplasty   TRANSTHORACIC ECHOCARDIOGRAM  05/08/2010   NORMAL LVSF/  EF 55%/  GRADE I DIASTOLIC DYSFUNCTION/  MILD BILATERAL ATRIUM ENLARGEMENT   Watchemn device  07/07/2022   Patient Active Problem List   Diagnosis Date Noted   Pain of left hand 01/20/2024   Arthritis of carpometacarpal (CMC) joint of left thumb 01/20/2024   Posterior tibial tendonitis 08/06/2022   Presence of Watchman left atrial appendage closure device 07/17/2022   Spinal stenosis of lumbar region 06/04/2022   Hematuria 05/16/2022   Coronary artery disease involving native coronary artery of native heart with unstable angina pectoris (HCC)    STEMI (ST elevation myocardial infarction) (HCC) 05/01/2022   Acute ST elevation myocardial infarction (STEMI) of inferior wall (HCC)    Recurrent kidney stones 08/14/2021   History of cerebrovascular accident (CVA) with residual deficit 02/11/2021   CVA (cerebral vascular accident) (HCC) 10/21/2020   Bilateral primary osteoarthritis of knee 02/15/2020   Acquired trigger finger of right little finger 11/03/2019   Encounter for orthopedic follow-up care 10/13/2019   H/O total hip arthroplasty, bilateral 10/24/2018   Gout 08/18/2017   Overweight  (BMI 25.0-29.9) 01/23/2017   Foot pain, left 02/11/2016   Sebaceous cyst 07/14/2014   Sun-damaged skin 07/14/2014   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/14/2014   Physical exam, annual 05/17/2012   Inguinal hernia 08/12/2010   HYPERLIPIDEMIA TYPE I / IV 05/01/2010   Paroxysmal a-fib (HCC) 05/01/2010   ABNORMAL ELECTROCARDIOGRAM 03/26/2010    PCP: Mahlon Crank MD   REFERRING PROVIDER: Georgina Ozell LABOR, MD  REFERRING DIAG:  Diagnosis  R26.89 (ICD-10-CM) - Imbalance    THERAPY DIAG:  Repeated falls  Difficulty in walking, not elsewhere classified  Muscle weakness (generalized)  Unsteadiness on feet  Rationale for Evaluation and Treatment: Rehabilitation  ONSET DATE: early May 2025  SUBJECTIVE:   SUBJECTIVE STATEMENT:   Back started hurting last night, pain going down his leg   Eval:Had been to the beach in early may with tennis and dancing and what not, fell on May 9th and then again when using just a cane. This progressed to not being able to walk period, had a lot of back pain and went the ED, got pain medicine and tx for bronchitis. Eventually ended up getting MRI which led to lumbar injections, this found that there was a disc and nerves being pinched or something. Injections helped a lot  but now my main concern is imbalance that has been ongoing but slowly started getting better in past 2 weeks.   PERTINENT HISTORY:  See above  PAIN:  Are you having pain? 4/10 low back   PRECAUTIONS: Fall  RED FLAGS: None   WEIGHT BEARING RESTRICTIONS: No  FALLS:  Has patient fallen in last 6 months? Yes. 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  DIAGNOSTIC FINDINGS:   CLINICAL DATA:  78 year old male with low back pain radiating down both legs since 02/04/2024. multiple falls.   EXAM: MRI LUMBAR SPINE WITHOUT CONTRAST   TECHNIQUE: Multiplanar, multisequence MR imaging of the lumbar spine was performed. No intravenous contrast was administered.   COMPARISON:  Lumbar spine CT 02/15/2024.  Lumbar MRI 05/13/2022.   FINDINGS: Segmentation: Normal on the comparison, the same numbering system used on the previous MRI.   Alignment: Stable since 2023. Subtle chronic grade 1 anterolisthesis of L3 on L4, mild at L5-S1. Underlying mild S-shaped lumbar scoliosis.   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 New degenerative anterior endplate marrow edema at T12-L1 (series 105, image 8).   Normal background bone marrow signal and maintained vertebral height. Intact visible sacrum and SI joints. No other acute osseous abnormality.   Conus medullaris and cauda equina: Conus extends to the L1 level. No lower spinal cord or conus signal abnormality.   Paraspinal and other soft tissues: Stable, negative Visualized abdominal viscera and paraspinal soft tissues.   Partially visible distended urinary bladder (series 105, image 4).   Disc levels:   Compared to the 2023 MRI:   T11-T12: Stable mild facet hypertrophy on the right without stenosis.   T12-L1: Progressed anterior disc space loss and new vacuum disc (pronounced vacuum phenomena on the recent CT). Stable mild disc bulging without stenosis.   L1-L2: Interbody ankylosis now. Endplate spurring. Moderate chronic left L1 foraminal stenosis. No  spinal stenosis.   L2-L3: Minor disc bulging. Stable mild facet and ligament flavum hypertrophy. No stenosis.   L3-L4: Progressed and now moderate to severe spinal and right lateral recess (right L4 nerve level) stenosis since 2023 in the setting of mild chronic anterolisthesis. Asymmetric disc/pseudo disc to the right with severe ligament flavum hypertrophy. Moderate to severe facet hypertrophy. Decreased facet joint fluid at this level. Moderate left L3 foraminal stenosis also appears mildly progressed.   L4-L5: Chronic disc space loss with increased circumferential disc bulge asymmetric to the right, moderate facet and ligament flavum hypertrophy. Increased mild spinal and moderate right lateral recess stenosis (right L5 nerve level series 106 image 33). Moderate right L4 neural foraminal stenosis also appears increased.   L5-S1: Stable mild disc/pseudo disc bulging. Moderate chronic facet degeneration and degenerative facet joint fluid. The left foraminal synovial cyst is less apparent on series 106, image 33. No convincing stenosis now.   IMPRESSION: 1. 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 - progressed and now moderate to severe multifactorial spinal and right lateral recess stenosis at L3-L4. Progressed moderate left foraminal stenosis. - progressed mild spinal and moderate multifactorial right lateral recess and right foraminal stenosis at L4-L5.   2. Chronic L5-S1 facet arthropathy, but a left foraminal synovial cysts there seems regressed.   3. Distended urinary bladder.  Query urinary retention.   XRs of the lumbar spine from 03/02/2024 were independently reviewed  and  interpreted, showing lumbar scoliotic curvature that measures 17 degrees  with apex to the right.  Disc height loss at L3/4 and L4/5.  No other  significant degenerative changes.  No evidence of instability on  flexion/extension views.  No fracture or dislocation seen.      PATIENT SURVEYS:  PSFS: THE PATIENT SPECIFIC FUNCTIONAL SCALE  Place score of 0-10 (0 = unable to perform activity and 10 = able to perform activity at the same level as before injury or problem)  Activity Date: 05/04/24    Walking with no device  4    2. Standing for a long time (>45 minutes) 2    3. Balance/steadiness in general  3    4.      Total Score 3      Total Score = Sum of activity scores/number of activities  Minimally Detectable Change: 3 points (for single activity); 2 points (for average score)  [facility] (nd). The Patient Specific Functional Scale . Retrieved from [email or web address]   COGNITION: Overall cognitive status: Within functional limits for tasks assessed        LOWER EXTREMITY MMT:  MMT Right eval Left eval  Hip flexion 4 4  Hip extension    Hip abduction 4- seated  4- seated   Hip adduction    Hip internal rotation    Hip external rotation    Knee flexion 4+ 4+  Knee extension 4+ 4+  Ankle dorsiflexion 4+ 4+  Ankle plantarflexion    Ankle inversion    Ankle eversion     (Blank rows = not tested)    FUNCTIONAL TESTS:  5 times sit to stand: 18.5 seconds no UEs; 05/25/24  13.8 seconds no UEs  Timed up and go (TUG): 21 seconds no device; 05/25/24 11.4 seconds   3 minute walk test: 450ft no device min guard   GAIT: Distance walked: 428ft Assistive device utilized: None Level of assistance: CGA Comments: more steady at first, got a lot more unsteady as fatigued increased, did need light MinA on turns towards end of test  TREATMENT DATE:  05/31/24 NuStep L 5 x 6 min Shoulder Ext 5lb 2x10 Standing rows 10lb 2x10 Sit to stand OGP yellow ball 2x10  HS curls 25lb 2x10 Leg Ext 10lb 2x10 Bridges x 10 Passive stretching to bilateral HS, piriformis, ITB  05/27/24 NuStep L 5 x 6 min Alt 6in box taps 2x10 Sit to stand on airex 2x10  Side steps on an off airex   On airex ball toss  6in step ups x10 each HS curls 25lb 2x10 Leg Ext 10lb 2x10  05/25/24  Scifit bike L4x8 minutes for w/u  Tandem stance 3x30 seconds B solid surface   5xSTS, TUG, goals, education on HEP   Lumbar rotation stretch 6x3 seconds  SKTC 5x5 seconds B Bridges x10      05/18/24:  Recumbent cycle level 4 x 6 min Standing with 1 # cuff wts each ankle, alt hip abd, 20x No UE support Standing alt forward toe taps to 8 surface 20 x with 1# wts ankles no UE support 5 x sit to stand, 15 sec  B Hamstring curls 25# 15 reps x 2 B Knee ext 10# 15 reps x 2 In ll bars for heel toe rocks on airex, 20x In ll bars on rocker board, for for /back rocks 20 x Dribbling 65 cm green physioball one lap in clinic dribbling Leg press B 20# 10x, 30# 10x 2     PATIENT EDUCATION:  Education details: exam findings, POC Person educated: Patient Education method: Explanation, Demonstration, and Handouts Education comprehension: verbalized understanding, returned demonstration, and needs further education  HOME EXERCISE PROGRAM:  Access Code: [email or web address] URL: [email or web address] Date: 05/25/2024 Prepared by: Josette Rough  Exercises - Supine Lower Trunk Rotation  - 2 x daily - 7 x weekly - 1 sets - 10 reps - 5 seconds  hold - Supine Single Knee to Chest  - 2 x daily - 7 x weekly - 1 sets - 10 reps - 5 seconds  hold - Supine Bridge  - 2 x daily - 5 x weekly - 1 sets - 10 reps - Seated Quadratus Lumborum Stretch in Chair  - 2 x daily - 7 x weekly - 1 sets - 6 reps - 30 seconds   hold - Standing Tandem Balance with Counter Support  - 1 x daily - 7 x weekly - 1 sets - 6 reps - 30 seconds  hold   ASSESSMENT:  CLINICAL IMPRESSION:    Arrives today doing well some low back pain. Time spent on functional strength and LE stretching.  Tactiel cues for posture needed with shoulder Ext and rows.  Cue for sequencing needed with sit to stands. Cue for full ROM needed with curls and ext. All interventions completed well. Will continue to challenge him.     EVAL: Patient is a 78 y.o. M who was seen today for physical therapy evaluation and treatment for  Diagnosis  R26.89 (ICD-10-CM) - Imbalance  . Arrived late to eval, very pleasant and cooperative but very talkative to so exam was very limited. Will make every effort to address functional concerns and reduce fall risk moving forward.   OBJECTIVE IMPAIRMENTS: Abnormal gait, decreased activity tolerance, decreased balance, decreased coordination, decreased knowledge of use of DME, decreased mobility, difficulty walking, decreased strength, and pain.   ACTIVITY LIMITATIONS: standing, squatting, stairs, transfers, and locomotion level  PARTICIPATION LIMITATIONS: driving, community activity, occupation, and yard work  PERSONAL FACTORS: Age, Behavior pattern, Fitness, Past/current experiences, Social background, and Time since onset  of injury/illness/exacerbation are also affecting patient's functional outcome.   REHAB POTENTIAL: Good  CLINICAL DECISION MAKING: Stable/uncomplicated  EVALUATION COMPLEXITY: Low   GOALS: Goals reviewed with patient? No  SHORT TERM GOALS: Target date: 06/01/2024    Will be compliant with appropriate progressive HEP  Baseline: Goal status: ONGOING 05/25/24 assigned initial program   2.  Will be able to name 3 ways to reduce fall risk at home and in community  Baseline:  Goal status: ONGOING 05/25/24  3.  Will complete TUG in 15 seconds or less no device  Baseline:  Goal status: MET  05/25/24  4.  Will complete 5xSTS in 15 seconds no UEs  Baseline:  Goal status: MET 05/25/24    LONG TERM GOALS: Target date: 06/29/2024    MMT to have improved by at least 1 grade in all weak groups  Baseline:  Goal status: INITIAL  2.  Will score at least 18 on DGI  Baseline:  Goal status: INITIAL  3.  Will able to ambulate in community with no more than baseline LRAD as desired without difficulty  Baseline:  Goal status: INITIAL  4.  Will be able to perform standing tasks for longer than 1 hour Baseline:  Goal status: INITIAL  5.  PSFS to have improved by at least 3 points  Baseline:  Goal status: INITIAL     PLAN:  PT FREQUENCY: 2x/week  PT DURATION: 8 weeks  PLANNED INTERVENTIONS: 97750- Physical Performance Testing, 97110-Therapeutic exercises, 97530- Therapeutic activity, [id number]- Neuromuscular re-education, 97535- Self Care, [id number]- Manual therapy, [id number]- Gait training, and DME instructions  PLAN FOR NEXT SESSION: balance, functional activity tolerance, strength; how does HEP feel?   Tanda Sorrow, PTA 05/31/24 2:28 PM

## 2024-06-01 ENCOUNTER — Ambulatory Visit (INDEPENDENT_AMBULATORY_CARE_PROVIDER_SITE_OTHER): Admitting: Orthopedic Surgery

## 2024-06-01 DIAGNOSIS — M5416 Radiculopathy, lumbar region: Secondary | ICD-10-CM | POA: Diagnosis not present

## 2024-06-01 NOTE — Progress Notes (Signed)
 Orthopedic Spine Surgery Office Note   Assessment: Patient is a 78 y.o. male with low back pain that radiates into bilateral lower extremities along the posterior aspect of the thigh and legs.  Has stenosis at L3/4 and L4/5     Plan: -Patient has tried PT, tylenol , prednisone , tramadol , lumbar steroid injection -Patient noticed 100% relief of pain with his prior injection.  Pain has slowly been returning so he was interested in repeat injection.  This would be a reasonable nonoperative treatment, so referral provided to him today - He has noticed significant improvement with physical therapy in terms of his balance.  He is no longer using any assistive devices.  He should continue to work with physical therapy and do the home exercise program.  I told him that once he is done with the formal physical therapy, he should continue to do the home exercises on his own -Patient should return to office in 9-10 weeks, x-rays at next visit: none     Patient expressed understanding of the plan and all questions were answered to the patient's satisfaction.    ___________________________________________________________________________     History:   Patient is a 78 y.o. male who presents today for follow up on his lumbar spine.  Patient is still doing much better than when he was initially seen in the office.  He has been working with physical therapy on his balance.  He is noticed significant improvement.  He is now no longer even using a cane.  He is ambulating without assistive devices.  His pain is also better than when he was first seen.  He got about 100% relief with the first injection that he got.  Pain has recently returned but is much milder than when he was first seen.  It is becoming more bothersome though with each day.  He feels the pain in his low back going into the bilateral posterior aspect of the thighs and legs to the level of the feet.  No bowel or bladder incontinence.  No saddle  anesthesia.   Treatments tried: PT, tylenol , prednisone , tramadol , lumbar steroid injection     Physical Exam:   General: no acute distress, appears stated age, sitting in wheelchair Neurologic: alert, answering questions appropriately, following commands Respiratory: unlabored breathing on room air, symmetric chest rise Psychiatric: appropriate affect, normal cadence to speech     MSK (spine)   -Strength exam                                                   Left                  Right EHL                              4/5                  4/5 TA                                 5/5                  5/5 GSC  5/5                  5/5 Knee extension            5/5                  5/5 Hip flexion                    4+/5                4+/5   -Sensory exam                           Sensation intact to light touch in L3-S1 nerve distributions of bilateral lower extremities   Imaging: XRs of the lumbar spine from 03/02/2024 were previously independently reviewed and interpreted, showing lumbar scoliotic curvature that measures 17 degrees with apex to the right.  Disc height loss at L1/2, L3/4 and L4/5.  No other significant degenerative changes.  No evidence of instability on flexion/extension views.  No fracture or dislocation seen.   MRI of the lumbar spine from 02/25/2024 was previously independently reviewed and interpreted, showing significant disc height loss with suspected autofusion at L1/2.  Central and lateral recess stenosis at L3/4.  Bilateral lateral recess stenosis at L4/5.     Patient name: [name] Short Patient MRN: [id number] Date of visit: 06/01/24

## 2024-06-02 ENCOUNTER — Encounter: Payer: Self-pay | Admitting: Physical Therapy

## 2024-06-02 ENCOUNTER — Ambulatory Visit: Admitting: Physical Therapy

## 2024-06-02 DIAGNOSIS — R2681 Unsteadiness on feet: Secondary | ICD-10-CM

## 2024-06-02 DIAGNOSIS — M6281 Muscle weakness (generalized): Secondary | ICD-10-CM | POA: Diagnosis not present

## 2024-06-02 DIAGNOSIS — R296 Repeated falls: Secondary | ICD-10-CM | POA: Diagnosis not present

## 2024-06-02 DIAGNOSIS — R262 Difficulty in walking, not elsewhere classified: Secondary | ICD-10-CM

## 2024-06-02 NOTE — Therapy (Signed)
 OUTPATIENT PHYSICAL THERAPY LOWER EXTREMITY TREATMENT   Patient Name: [name] Short MRN: [id number] DOB:1946-03-07, 78 y.o., male Today's Date: 06/02/2024  END OF SESSION:  PT End of Session - 06/02/24 1431     Visit Number 9    Date for PT Re-Evaluation 06/29/24    PT Start Time 1430    PT Stop Time 1515    PT Time Calculation (min) 45 min    Activity Tolerance Patient tolerated treatment well    Behavior During Therapy WFL for tasks assessed/performed               Past Medical History:  Diagnosis Date   Arthritis    WRIST   Benign localized prostatic hyperplasia with lower urinary tract symptoms (LUTS)    Complication of anesthesia    slow to wake   ED (erectile dysfunction)    GERD (gastroesophageal reflux disease)    History of concussion    AS CHILD--  NO RESIDUAL   History of gout    History of kidney stones    Hyperlipidemia    Inguinal hernia, bilateral    Nephrolithiasis    BILATERAL   PAF (paroxysmal atrial fibrillation) (HCC) currently being followed by pcp   EPISODE --  2011  FOLLOW-UP W/ DR WALL  /  HAS NOT SEEN ANY CARDIOLOGIST SINCE   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 Cindie   Past Surgical History:  Procedure Laterality Date   APPENDECTOMY  1983   CARDIOVASCULAR STRESS TEST  05-08-2010  DR WALL   NO EVIDENCE OF SCAR OR ISCHEMIA/ EF 54%/  PT HAD BOTH AFIB/ AFLUTTER DURING STUDY   CARPAL TUNNEL RELEASE Right 01/ 14/ 2021   CORONARY STENT INTERVENTION N/A 05/02/2022   Procedure: CORONARY STENT INTERVENTION;  Surgeon: Verlin Lonni BIRCH, MD;  Location: [facility];  Service: Cardiovascular;  Laterality: N/A;   CORONARY/GRAFT ACUTE MI REVASCULARIZATION N/A 05/01/2022   Procedure: Coronary/Graft Acute MI Revascularization;  Surgeon: Verlin Lonni BIRCH, MD;  Location: [facility];  Service: Cardiovascular;  Laterality: N/A;   CYSTOSCOPY W/ URETERAL STENT  PLACEMENT Left 09/20/2013   Procedure: CYSTOSCOPY WITH STENT REPLACEMENT;  Surgeon: Donnice Gwenyth Brooks, MD;  Location: [facility];  Service: Urology;  Laterality: Left;   CYSTOSCOPY WITH URETEROSCOPY Left 09/20/2013   Procedure: CYSTOSCOPY WITH URETEROSCOPY;  Surgeon: Donnice Gwenyth Brooks, MD;  Location: [facility];  Service: Urology;  Laterality: Left;   CYSTOSCOPY WITH URETEROSCOPY AND STENT PLACEMENT Left 07/12/2013   Procedure: CYSTOSCOPY WITH LITHOLAPEXY, LEFT URETERAL STENT PLACEMENT;  Surgeon: Donnice Gwenyth Brooks, MD;  Location: [facility];  Service: Urology;  Laterality: Left;   CYSTOSCOPY WITH URETEROSCOPY AND STENT PLACEMENT Bilateral 06/23/2014   Procedure: BILATERAL URETEROSCOPY, HOLMIUM LASER LITHOTRIPSY AND STENT PLACEMENT, RIGHT ;  Surgeon: Donnice Brooks, MD;  Location: [facility];  Service: Urology;  Laterality: Bilateral;   EXTRACORPOREAL SHOCK WAVE LITHOTRIPSY  X2   EXTRACORPOREAL SHOCK WAVE LITHOTRIPSY Left 08-29-2013;   07-28-2013   EXTRACORPOREAL SHOCK WAVE LITHOTRIPSY Right 12/09/2018   Procedure: EXTRACORPOREAL SHOCK WAVE LITHOTRIPSY (ESWL);  Surgeon: Brooks Donnice, MD;  Location: [facility];  Service: Urology;  Laterality: Right;   HOLMIUM LASER APPLICATION Left 09/20/2013   Procedure: HOLMIUM LASER APPLICATION;  Surgeon: Donnice Gwenyth Brooks, MD;  Location: [facility];  Service: Urology;  Laterality: Left;   INGUINAL HERNIA REPAIR Bilateral 10/16/2020   Procedure: BILATERAL  OPEN INGUINAL HERNIA REPAIR WITH MESH;  Surgeon: Vernetta Berg, MD;  Location: [facility];  Service: General;  Laterality: Bilateral;  LMA/TAP BLOCK   LAPAROSCOPIC INGUINAL HERNIA REPAIR Bilateral 09/04/2010   w/ mesh   LEFT ATRIAL APPENDAGE OCCLUSION N/A 07/17/2022   Procedure: LEFT ATRIAL APPENDAGE OCCLUSION;  Surgeon: Cindie Ole DASEN, MD;  Location: [facility];  Service:  Cardiovascular;  Laterality: N/A;   LEFT HEART CATH AND CORONARY ANGIOGRAPHY N/A 05/01/2022   Procedure: LEFT HEART CATH AND CORONARY ANGIOGRAPHY;  Surgeon: Verlin Lonni BIRCH, MD;  Location: [facility];  Service: Cardiovascular;  Laterality: N/A;   TEE WITHOUT CARDIOVERSION N/A 07/17/2022   Procedure: TRANSESOPHAGEAL ECHOCARDIOGRAM (TEE);  Surgeon: Cindie Ole DASEN, MD;  Location: [facility];  Service: Cardiovascular;  Laterality: N/A;   TONSILLECTOMY  AS CHILD   TOTAL HIP ARTHROPLASTY Left 05/11/2002   TOTAL HIP ARTHROPLASTY Right 11/15/2012   Procedure: RIGHT TOTAL HIP ARTHROPLASTY ANTERIOR APPROACH;  Surgeon: Oneil JAYSON Herald, MD;  Location: [facility];  Service: Orthopedics;  Laterality: Right;  Right total hip arthroplasty   TRANSTHORACIC ECHOCARDIOGRAM  05/08/2010   NORMAL LVSF/  EF 55%/  GRADE I DIASTOLIC DYSFUNCTION/  MILD BILATERAL ATRIUM ENLARGEMENT   Watchemn device  07/07/2022   Patient Active Problem List   Diagnosis Date Noted   Pain of left hand 01/20/2024   Arthritis of carpometacarpal (CMC) joint of left thumb 01/20/2024   Posterior tibial tendonitis 08/06/2022   Presence of Watchman left atrial appendage closure device 07/17/2022   Spinal stenosis of lumbar region 06/04/2022   Hematuria 05/16/2022   Coronary artery disease involving native coronary artery of native heart with unstable angina pectoris (HCC)    STEMI (ST elevation myocardial infarction) (HCC) 05/01/2022   Acute ST elevation myocardial infarction (STEMI) of inferior wall (HCC)    Recurrent kidney stones 08/14/2021   History of cerebrovascular accident (CVA) with residual deficit 02/11/2021   CVA (cerebral vascular accident) (HCC) 10/21/2020   Bilateral primary osteoarthritis of knee 02/15/2020   Acquired trigger finger of right little finger 11/03/2019   Encounter for orthopedic follow-up care 10/13/2019   H/O total hip arthroplasty, bilateral 10/24/2018   Gout 08/18/2017   Overweight  (BMI 25.0-29.9) 01/23/2017   Foot pain, left 02/11/2016   Sebaceous cyst 07/14/2014   Sun-damaged skin 07/14/2014   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/14/2014   Physical exam, annual 05/17/2012   Inguinal hernia 08/12/2010   HYPERLIPIDEMIA TYPE I / IV 05/01/2010   Paroxysmal a-fib (HCC) 05/01/2010   ABNORMAL ELECTROCARDIOGRAM 03/26/2010    PCP: Mahlon Crank MD   REFERRING PROVIDER: Georgina Ozell LABOR, MD  REFERRING DIAG:  Diagnosis  R26.89 (ICD-10-CM) - Imbalance    THERAPY DIAG:  Repeated falls  Difficulty in walking, not elsewhere classified  Unsteadiness on feet  Muscle weakness (generalized)  Rationale for Evaluation and Treatment: Rehabilitation  ONSET DATE: early May 2025  SUBJECTIVE:   SUBJECTIVE STATEMENT:   Getting by   Eval:Had been to the beach in early may with tennis and dancing and what not, fell on May 9th and then again when using just a cane. This progressed to not being able to walk period, had a lot of back pain and went the ED, got pain medicine and tx for bronchitis. Eventually ended up getting MRI which led to lumbar injections, this found that there was a disc and nerves being pinched or something. Injections helped a lot but now my main concern is imbalance that  has been ongoing but slowly started getting better in past 2 weeks.   PERTINENT HISTORY:  See above  PAIN:  Are you having pain? 2/10 low back   PRECAUTIONS: Fall  RED FLAGS: None   WEIGHT BEARING RESTRICTIONS: No  FALLS:  Has patient fallen in last 6 months? Yes. 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  DIAGNOSTIC FINDINGS:   CLINICAL DATA:  78 year old male with low back pain radiating down both legs since 02/04/2024. multiple falls.   EXAM: MRI LUMBAR SPINE WITHOUT CONTRAST   TECHNIQUE: Multiplanar, multisequence MR imaging of the lumbar spine was performed. No intravenous contrast was administered.   COMPARISON:  Lumbar spine CT 02/15/2024.  Lumbar MRI 05/13/2022.   FINDINGS: Segmentation: Normal on the comparison, the same numbering system used on the previous MRI.   Alignment: Stable since 2023. Subtle chronic grade 1 anterolisthesis of L3 on L4, mild at L5-S1. Underlying mild S-shaped lumbar scoliosis.   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 New degenerative anterior endplate marrow edema at T12-L1 (series 105, image 8).   Normal background bone marrow signal and maintained vertebral height. Intact visible sacrum and SI joints. No other acute osseous abnormality.   Conus medullaris and cauda equina: Conus extends to the L1 level. No lower spinal cord or conus signal abnormality.   Paraspinal and other soft tissues: Stable, negative Visualized abdominal viscera and paraspinal soft tissues.   Partially visible distended urinary bladder (series 105, image 4).   Disc levels:   Compared to the 2023 MRI:   T11-T12: Stable mild facet hypertrophy on the right without stenosis.   T12-L1: Progressed anterior disc space loss and new vacuum disc (pronounced vacuum phenomena on the recent CT). Stable mild disc bulging without stenosis.   L1-L2: Interbody ankylosis now. Endplate spurring. Moderate chronic left L1 foraminal stenosis. No spinal stenosis.   L2-L3: Minor disc  bulging. Stable mild facet and ligament flavum hypertrophy. No stenosis.   L3-L4: Progressed and now moderate to severe spinal and right lateral recess (right L4 nerve level) stenosis since 2023 in the setting of mild chronic anterolisthesis. Asymmetric disc/pseudo disc to the right with severe ligament flavum hypertrophy. Moderate to severe facet hypertrophy. Decreased facet joint fluid at this level. Moderate left L3 foraminal stenosis also appears mildly progressed.   L4-L5: Chronic disc space loss with increased circumferential disc bulge asymmetric to the right, moderate facet and ligament flavum hypertrophy. Increased mild spinal and moderate right lateral recess stenosis (right L5 nerve level series 106 image 33). Moderate right L4 neural foraminal stenosis also appears increased.   L5-S1: Stable mild disc/pseudo disc bulging. Moderate chronic facet degeneration and degenerative facet joint fluid. The left foraminal synovial cyst is less apparent on series 106, image 33. No convincing stenosis now.   IMPRESSION: 1. 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 Significant new/progressed  finding since 2023 as follows: - Interbody ankylosis has developed at L1-L2, with progressed and now severe adjacent segment disc degeneration at T12-L1 with new vacuum disc and new degenerative endplate marrow edema there. - progressed and now moderate to severe multifactorial spinal and right lateral recess stenosis at L3-L4. Progressed moderate left foraminal stenosis. - progressed mild spinal and moderate multifactorial right lateral recess and right foraminal stenosis at L4-L5.   2. Chronic L5-S1 facet arthropathy, but a left foraminal synovial cysts there seems regressed.   3. Distended urinary bladder.  Query urinary retention.   XRs of the lumbar spine from 03/02/2024 were independently reviewed and  interpreted, showing lumbar  scoliotic curvature that measures 17 degrees  with apex to the right.  Disc height loss at L3/4 and L4/5.  No other  significant degenerative changes.  No evidence of instability on  flexion/extension views.  No fracture or dislocation seen.      PATIENT SURVEYS:  PSFS: THE PATIENT SPECIFIC FUNCTIONAL SCALE  Place score of 0-10 (0 = unable to perform activity and 10 = able to perform activity at the same level as before injury or problem)  Activity Date: 05/04/24    Walking with no device  4    2. Standing for a long time (>45 minutes) 2    3. Balance/steadiness in general  3    4.      Total Score 3      Total Score = Sum of activity scores/number of activities  Minimally Detectable Change: 3 points (for single activity); 2 points (for average score)  [facility] (nd). The Patient Specific Functional Scale . Retrieved from [email or web address]   COGNITION: Overall cognitive status: Within functional limits for tasks assessed        LOWER EXTREMITY MMT:  MMT Right eval Left eval Right 9/425 Left 06/02/24  Hip flexion 4 4 5 5   Hip extension      Hip abduction 4- seated  4- seated  4+ 4+  Hip adduction      Hip internal rotation      Hip external rotation      Knee flexion 4+ 4+ 4+ 4+  Knee extension 4+ 4+ 5 5  Ankle dorsiflexion 4+ 4+ 5   Ankle plantarflexion      Ankle inversion      Ankle eversion       (Blank rows = not tested)    FUNCTIONAL TESTS:  5 times sit to stand: 18.5 seconds no UEs; 05/25/24  13.8 seconds no UEs  Timed up and go (TUG): 21 seconds no device; 05/25/24 11.4 seconds   3 minute walk test: 430ft no device min guard   GAIT: Distance walked: 443ft Assistive device utilized: None Level of assistance: CGA Comments: more steady at first, got a lot more unsteady as fatigued increased, did need light MinA on turns towards end of test  TREATMENT DATE:  06/02/24 NuStep L5 x 6 min Goals Gait around the widest part of back building in parking lot   Strenuous at the end going up hill Rows and lats 25lb 2x10 Shoulder Ext 5lb 2x10  05/31/24 NuStep L 5 x 6 min Shoulder Ext 5lb 2x10 Standing rows 10lb 2x10 Sit to stand OHP yellow ball 2x10  HS curls 25lb 2x10 Leg Ext 10lb 2x10 Bridges x 10 Passive stretching to bilateral HS, piriformis, ITB  05/27/24 NuStep L 5 x 6 min Alt 6in box taps 2x10 Sit to stand on airex 2x10  Side steps on an off airex   On airex ball toss  6in step ups x10 each HS curls 25lb 2x10 Leg Ext 10lb 2x10  05/25/24  Scifit bike L4x8 minutes for w/u  Tandem stance 3x30 seconds B solid surface   5xSTS, TUG, goals, education on HEP   Lumbar rotation stretch 6x3 seconds  SKTC 5x5 seconds B Bridges x10      05/18/24:  Recumbent cycle level 4 x 6 min Standing with 1 # cuff wts each ankle, alt hip abd, 20x No UE support Standing alt forward toe taps to 8 surface 20 x with 1# wts ankles no UE support 5 x sit to stand, 15 sec  B Hamstring curls 25# 15 reps x 2 B Knee ext 10# 15 reps x 2 In ll bars for heel toe rocks on airex, 20x In ll bars on rocker board, for for /back rocks 20 x Dribbling 65 cm green physioball one lap in clinic dribbling Leg press B 20# 10x, 30# 10x 2     PATIENT EDUCATION:  Education details: exam findings, POC Person educated: Patient Education method: Explanation, Demonstration, and Handouts Education comprehension: verbalized understanding, returned demonstration, and needs further education  HOME EXERCISE PROGRAM:  Access Code: [email or web address] URL: [email or web address] Date: 05/25/2024 Prepared by: Josette Rough  Exercises - Supine Lower Trunk Rotation  - 2 x daily - 7 x weekly - 1 sets - 10 reps - 5 seconds  hold - Supine Single Knee to Chest  - 2 x daily - 7  x weekly - 1 sets - 10 reps - 5 seconds  hold - Supine Bridge  - 2 x daily - 5 x weekly - 1 sets - 10 reps - Seated Quadratus Lumborum Stretch in Chair  - 2 x daily - 7 x weekly - 1 sets - 6 reps - 30 seconds  hold - Standing Tandem Balance with Counter Support  - 1 x daily - 7 x weekly - 1 sets - 6 reps - 30 seconds  hold   ASSESSMENT:  CLINICAL IMPRESSION:    Again pt arrive doing well with some low back pain. Objective measures take and t has increased his LE strength.   Tactiel cues for posture needed with shoulder Ext.  Increase fatigue durng the uphill section of outdoor ambulation.. All interventions completed well. Will continue to challenge him.     EVAL: Patient is a 78 y.o. M who was seen today for physical therapy evaluation and treatment for  Diagnosis  R26.89 (ICD-10-CM) - Imbalance  . Arrived late to eval, very pleasant and cooperative but very talkative to so exam was very limited. Will make every effort to address functional concerns and reduce fall risk moving forward.   OBJECTIVE IMPAIRMENTS: Abnormal gait, decreased activity tolerance, decreased balance, decreased coordination, decreased knowledge of use of DME, decreased mobility, difficulty walking, decreased strength, and pain.   ACTIVITY LIMITATIONS:  standing, squatting, stairs, transfers, and locomotion level  PARTICIPATION LIMITATIONS: driving, community activity, occupation, and yard work  PERSONAL FACTORS: Age, Behavior pattern, Fitness, Past/current experiences, Social background, and Time since onset of injury/illness/exacerbation are also affecting patient's functional outcome.   REHAB POTENTIAL: Good  CLINICAL DECISION MAKING: Stable/uncomplicated  EVALUATION COMPLEXITY: Low   GOALS: Goals reviewed with patient? No  SHORT TERM GOALS: Target date: 06/01/2024    Will be compliant with appropriate progressive HEP  Baseline: Goal status: ONGOING 05/25/24 assigned initial program   2.  Will be able  to name 3 ways to reduce fall risk at home and in community  Baseline:  Goal status: ONGOING 05/25/24, Met 06/02/24  3.  Will complete TUG in 15 seconds or less no device  Baseline:  Goal status: MET 05/25/24  4.  Will complete 5xSTS in 15 seconds no UEs  Baseline:  Goal status: MET 05/25/24    LONG TERM GOALS: Target date: 06/29/2024    MMT to have improved by at least 1 grade in all weak groups  Baseline:  Goal status: INITIAL  2.  Will score at least 18 on DGI  Baseline:  Goal status: INITIAL  3.  Will able to ambulate in community with no more than baseline LRAD as desired without difficulty  Baseline:  Goal status: Met 06/02/24 with back pain  4.  Will be able to perform standing tasks for longer than 1 hour Baseline:  Goal status: Progressing Guess I could but don't usually try 06/02/24  5.  PSFS to have improved by at least 3 points  Baseline:  Goal status: INITIAL     PLAN:  PT FREQUENCY: 2x/week  PT DURATION: 8 weeks  PLANNED INTERVENTIONS: 97750- Physical Performance Testing, 97110-Therapeutic exercises, 97530- Therapeutic activity, [id number]- Neuromuscular re-education, 97535- Self Care, [id number]- Manual therapy, [id number]- Gait training, and DME instructions  PLAN FOR NEXT SESSION: balance, functional activity tolerance, strength; how does HEP feel?   Tanda Sorrow, PTA 06/02/24 2:32 PM

## 2024-06-06 ENCOUNTER — Ambulatory Visit: Admitting: Physical Therapy

## 2024-06-06 ENCOUNTER — Encounter: Payer: Self-pay | Admitting: Physical Therapy

## 2024-06-06 DIAGNOSIS — M6281 Muscle weakness (generalized): Secondary | ICD-10-CM | POA: Diagnosis not present

## 2024-06-06 DIAGNOSIS — R262 Difficulty in walking, not elsewhere classified: Secondary | ICD-10-CM | POA: Diagnosis not present

## 2024-06-06 DIAGNOSIS — R296 Repeated falls: Secondary | ICD-10-CM | POA: Diagnosis not present

## 2024-06-06 DIAGNOSIS — R2681 Unsteadiness on feet: Secondary | ICD-10-CM

## 2024-06-06 NOTE — Therapy (Signed)
 OUTPATIENT PHYSICAL THERAPY LOWER EXTREMITY TREATMENT Progress Note Reporting Period 05/04/24 to 06/06/24  See note below for Objective Data and Assessment of Progress/Goals.      Patient Name: [name] Short MRN: [id number] DOB:17-May-1946, 78 y.o., male Today's Date: 06/06/2024  END OF SESSION:  PT End of Session - 06/06/24 1434     Visit Number 10    Date for PT Re-Evaluation 06/29/24    PT Start Time 1434    PT Stop Time 1515    PT Time Calculation (min) 41 min    Activity Tolerance Patient tolerated treatment well    Behavior During Therapy WFL for tasks assessed/performed               Past Medical History:  Diagnosis Date   Arthritis    WRIST   Benign localized prostatic hyperplasia with lower urinary tract symptoms (LUTS)    Complication of anesthesia    slow to wake   ED (erectile dysfunction)    GERD (gastroesophageal reflux disease)    History of concussion    AS CHILD--  NO RESIDUAL   History of gout    History of kidney stones    Hyperlipidemia    Inguinal hernia, bilateral    Nephrolithiasis    BILATERAL   PAF (paroxysmal atrial fibrillation) (HCC) currently being followed by pcp   EPISODE --  2011  FOLLOW-UP W/ DR WALL  /  HAS NOT SEEN ANY CARDIOLOGIST SINCE   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 Cindie   Past Surgical History:  Procedure Laterality Date   APPENDECTOMY  1983   CARDIOVASCULAR STRESS TEST  05-08-2010  DR WALL   NO EVIDENCE OF SCAR OR ISCHEMIA/ EF 54%/  PT HAD BOTH AFIB/ AFLUTTER DURING STUDY   CARPAL TUNNEL RELEASE Right 01/ 14/ 2021   CORONARY STENT INTERVENTION N/A 05/02/2022   Procedure: CORONARY STENT INTERVENTION;  Surgeon: Verlin Lonni BIRCH, MD;  Location: [facility];  Service: Cardiovascular;  Laterality: N/A;   CORONARY/GRAFT ACUTE MI REVASCULARIZATION N/A 05/01/2022   Procedure: Coronary/Graft Acute MI Revascularization;  Surgeon: Verlin Lonni BIRCH, MD;  Location: [facility];  Service: Cardiovascular;  Laterality: N/A;   CYSTOSCOPY W/ URETERAL STENT PLACEMENT Left 09/20/2013   Procedure: CYSTOSCOPY WITH STENT REPLACEMENT;  Surgeon: Donnice Gwenyth Brooks, MD;  Location: [facility];  Service: Urology;  Laterality: Left;   CYSTOSCOPY WITH URETEROSCOPY Left 09/20/2013   Procedure: CYSTOSCOPY WITH URETEROSCOPY;  Surgeon: Donnice Gwenyth Brooks, MD;  Location: [facility];  Service: Urology;  Laterality: Left;   CYSTOSCOPY WITH URETEROSCOPY AND STENT PLACEMENT Left 07/12/2013   Procedure: CYSTOSCOPY WITH LITHOLAPEXY, LEFT URETERAL STENT PLACEMENT;  Surgeon: Donnice Gwenyth Brooks, MD;  Location: [facility];  Service: Urology;  Laterality: Left;   CYSTOSCOPY WITH URETEROSCOPY AND STENT PLACEMENT Bilateral 06/23/2014   Procedure: BILATERAL URETEROSCOPY, HOLMIUM LASER LITHOTRIPSY AND STENT PLACEMENT, RIGHT ;  Surgeon: Donnice Brooks, MD;  Location: [facility];  Service: Urology;  Laterality: Bilateral;   EXTRACORPOREAL SHOCK WAVE LITHOTRIPSY  X2   EXTRACORPOREAL SHOCK WAVE LITHOTRIPSY Left 08-29-2013;   07-28-2013   EXTRACORPOREAL SHOCK WAVE LITHOTRIPSY Right 12/09/2018   Procedure: EXTRACORPOREAL SHOCK WAVE LITHOTRIPSY (ESWL);  Surgeon: Brooks Donnice, MD;  Location: [facility];  Service: Urology;  Laterality: Right;   HOLMIUM LASER APPLICATION Left 09/20/2013   Procedure: HOLMIUM LASER APPLICATION;  Surgeon: Donnice Gwenyth Brooks, MD;  Location:  [facility];  Service: Urology;  Laterality: Left;   INGUINAL HERNIA REPAIR Bilateral 10/16/2020   Procedure: BILATERAL OPEN INGUINAL HERNIA REPAIR WITH MESH;  Surgeon: Vernetta Berg, MD;  Location: [facility];  Service: General;  Laterality: Bilateral;  LMA/TAP BLOCK   LAPAROSCOPIC INGUINAL HERNIA REPAIR Bilateral 09/04/2010   w/ mesh   LEFT ATRIAL APPENDAGE OCCLUSION N/A  07/17/2022   Procedure: LEFT ATRIAL APPENDAGE OCCLUSION;  Surgeon: Cindie Ole DASEN, MD;  Location: [facility];  Service: Cardiovascular;  Laterality: N/A;   LEFT HEART CATH AND CORONARY ANGIOGRAPHY N/A 05/01/2022   Procedure: LEFT HEART CATH AND CORONARY ANGIOGRAPHY;  Surgeon: Verlin Lonni BIRCH, MD;  Location: [facility];  Service: Cardiovascular;  Laterality: N/A;   TEE WITHOUT CARDIOVERSION N/A 07/17/2022   Procedure: TRANSESOPHAGEAL ECHOCARDIOGRAM (TEE);  Surgeon: Cindie Ole DASEN, MD;  Location: [facility];  Service: Cardiovascular;  Laterality: N/A;   TONSILLECTOMY  AS CHILD   TOTAL HIP ARTHROPLASTY Left 05/11/2002   TOTAL HIP ARTHROPLASTY Right 11/15/2012   Procedure: RIGHT TOTAL HIP ARTHROPLASTY ANTERIOR APPROACH;  Surgeon: Oneil JAYSON Herald, MD;  Location: [facility];  Service: Orthopedics;  Laterality: Right;  Right total hip arthroplasty   TRANSTHORACIC ECHOCARDIOGRAM  05/08/2010   NORMAL LVSF/  EF 55%/  GRADE I DIASTOLIC DYSFUNCTION/  MILD BILATERAL ATRIUM ENLARGEMENT   Watchemn device  07/07/2022   Patient Active Problem List   Diagnosis Date Noted   Pain of left hand 01/20/2024   Arthritis of carpometacarpal (CMC) joint of left thumb 01/20/2024   Posterior tibial tendonitis 08/06/2022   Presence of Watchman left atrial appendage closure device 07/17/2022   Spinal stenosis of lumbar region 06/04/2022   Hematuria 05/16/2022   Coronary artery disease involving native coronary artery of native heart with unstable angina pectoris (HCC)    STEMI (ST elevation myocardial infarction) (HCC) 05/01/2022   Acute ST elevation myocardial infarction (STEMI) of inferior wall (HCC)    Recurrent kidney stones 08/14/2021   History of cerebrovascular accident (CVA) with residual deficit 02/11/2021   CVA (cerebral vascular accident) (HCC) 10/21/2020   Bilateral primary osteoarthritis of knee 02/15/2020   Acquired trigger finger of right little finger 11/03/2019    Encounter for orthopedic follow-up care 10/13/2019   H/O total hip arthroplasty, bilateral 10/24/2018   Gout 08/18/2017   Overweight (BMI 25.0-29.9) 01/23/2017   Foot pain, left 02/11/2016   Sebaceous cyst 07/14/2014   Sun-damaged skin 07/14/2014   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/14/2014   Physical exam, annual 05/17/2012   Inguinal hernia 08/12/2010   HYPERLIPIDEMIA TYPE I / IV 05/01/2010   Paroxysmal a-fib (HCC) 05/01/2010   ABNORMAL ELECTROCARDIOGRAM 03/26/2010    PCP: Mahlon Crank MD   REFERRING PROVIDER: Georgina Ozell LABOR, MD  REFERRING DIAG:  Diagnosis  R26.89 (ICD-10-CM) - Imbalance    THERAPY DIAG:  Repeated falls  Difficulty in walking, not elsewhere classified  Unsteadiness on feet  Muscle weakness (generalized)  Rationale for Evaluation and Treatment: Rehabilitation  ONSET DATE: early May 2025  SUBJECTIVE:   SUBJECTIVE STATEMENT:  Back was hurting earlier today, but now its not so bad    Eval:Had been to the beach in early may with tennis and dancing and what not, fell on May 9th and then again when using just a cane. This progressed to not being able to walk period, had a lot of back pain and went the ED, got pain medicine and tx for bronchitis. Eventually ended up getting MRI  which led to lumbar injections, this found that there was a disc and nerves being pinched or something. Injections helped a lot but now my main concern is imbalance that has been ongoing but slowly started getting better in past 2 weeks.   PERTINENT HISTORY:  See above  PAIN:  Are you having pain? 1/10 low back   PRECAUTIONS: Fall  RED FLAGS: None   WEIGHT BEARING RESTRICTIONS: No  FALLS:  Has patient fallen in last 6 months? Yes. 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  DIAGNOSTIC FINDINGS:   CLINICAL DATA:  78 year old male with low back pain radiating down both legs since 02/04/2024. multiple falls.   EXAM: MRI LUMBAR SPINE WITHOUT CONTRAST   TECHNIQUE: Multiplanar, multisequence MR imaging of the lumbar spine was performed. No intravenous contrast was administered.   COMPARISON:  Lumbar spine CT 02/15/2024.  Lumbar MRI 05/13/2022.   FINDINGS: Segmentation: Normal on the comparison, the same numbering system used on the previous MRI.   Alignment: Stable since 2023. Subtle chronic grade 1 anterolisthesis of L3 on L4, mild at L5-S1. Underlying mild S-shaped lumbar scoliosis.   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 New degenerative anterior endplate marrow edema at T12-L1 (series 105, image 8).   Normal background bone marrow signal and maintained vertebral height. Intact visible sacrum and SI joints. No other acute osseous abnormality.   Conus medullaris and cauda equina: Conus extends to the L1 level. No lower spinal cord or conus signal abnormality.   Paraspinal and other soft tissues: Stable, negative Visualized abdominal viscera and paraspinal soft tissues.   Partially visible distended urinary bladder (series 105, image 4).   Disc levels:   Compared to the 2023 MRI:   T11-T12: Stable mild facet hypertrophy on the right without stenosis.   T12-L1: Progressed anterior disc space loss and new vacuum disc (pronounced vacuum phenomena on the recent CT). Stable mild  disc bulging without stenosis.   L1-L2: Interbody ankylosis now. Endplate spurring. Moderate chronic left L1 foraminal stenosis. No spinal stenosis.   L2-L3: Minor disc bulging. Stable mild facet and ligament flavum hypertrophy. No stenosis.   L3-L4: Progressed and now moderate to severe spinal and right lateral recess (right L4 nerve level) stenosis since 2023 in the setting of mild chronic anterolisthesis. Asymmetric disc/pseudo disc to the right with severe ligament flavum hypertrophy. Moderate to severe facet hypertrophy. Decreased facet joint fluid at this level. Moderate left L3 foraminal stenosis also appears mildly progressed.   L4-L5: Chronic disc space loss with increased circumferential disc bulge asymmetric to the right, moderate facet and ligament flavum hypertrophy. Increased mild spinal and moderate right lateral recess stenosis (right L5 nerve level series 106 image 33). Moderate right L4 neural foraminal stenosis also appears increased.   L5-S1: Stable mild disc/pseudo disc bulging. Moderate chronic facet degeneration and degenerative facet joint fluid. The left foraminal synovial  cyst is less apparent on series 106, image 33. No convincing stenosis now.   IMPRESSION: 1. 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 - progressed and now moderate to severe multifactorial spinal and right lateral recess stenosis at L3-L4. Progressed moderate left foraminal stenosis. - progressed mild spinal and moderate multifactorial right lateral recess and right foraminal stenosis at L4-L5.   2. Chronic L5-S1 facet arthropathy, but a left foraminal synovial cysts there seems  regressed.   3. Distended urinary bladder.  Query urinary retention.   XRs of the lumbar spine from 03/02/2024 were independently reviewed and  interpreted, showing lumbar scoliotic curvature that measures 17 degrees  with apex to the right.  Disc height loss at L3/4 and L4/5.  No other  significant degenerative changes.  No evidence of instability on  flexion/extension views.  No fracture or dislocation seen.      PATIENT SURVEYS:  PSFS: THE PATIENT SPECIFIC FUNCTIONAL SCALE  Place score of 0-10 (0 = unable to perform activity and 10 = able to perform activity at the same level as before injury or problem)  Activity Date: 05/04/24 06/06/24   Walking with no device  4 6   2. Standing for a long time (>45 minutes) 2 6   3. Balance/steadiness in general  3 4   4.      Total Score 3 5     Total Score = Sum of activity scores/number of activities  Minimally Detectable Change: 3 points (for single activity); 2 points (for average score)  [facility] (nd). The Patient Specific Functional Scale . Retrieved from [email or web address]   COGNITION: Overall cognitive status: Within functional limits for tasks assessed        LOWER EXTREMITY MMT:  MMT Right eval Left eval Right 9/425 Left 06/02/24  Hip flexion 4 4 5 5   Hip extension      Hip abduction 4- seated  4- seated  4+ 4+  Hip adduction      Hip internal rotation      Hip external rotation      Knee flexion 4+ 4+ 4+ 4+  Knee extension 4+ 4+ 5 5  Ankle dorsiflexion 4+ 4+ 5   Ankle plantarflexion      Ankle inversion      Ankle eversion       (Blank rows = not tested)    FUNCTIONAL TESTS:  5 times sit to stand: 18.5 seconds no UEs; 05/25/24  13.8 seconds no UEs  Timed up and go (TUG): 21 seconds no device; 05/25/24 11.4 seconds   3 minute walk test: 465ft no device min guard   GAIT: Distance walked: 446ft Assistive device utilized: None Level of  assistance: CGA Comments: more steady at first, got a lot more unsteady as fatigued increased, did need light MinA on turns towards end of test  TREATMENT DATE:  06/06/24 Bike L4 x 6 min  PSFS 5 DGI 21/30 Sit to stand OHP yellow ball 2x10  Shoulder Ext 5lb 2x10 Standing rows 10lb 2x10 6in step ups x10 each  06/02/24 NuStep L5 x 6 min Goals Gait around the widest part of back building in parking lot   Strenuous at the end going up hill Rows and lats 25lb 2x10 Shoulder Ext 5lb 2x10  05/31/24 NuStep L 5 x 6 min Shoulder Ext 5lb 2x10 Standing rows 10lb 2x10 Sit to stand OHP yellow ball 2x10  HS curls 25lb 2x10 Leg Ext 10lb 2x10 Bridges x 10 Passive stretching to bilateral HS, piriformis, ITB  05/27/24 NuStep L 5 x 6 min Alt 6in box taps 2x10 Sit to stand on airex 2x10  Side steps on an off airex   On airex ball toss  6in step ups x10 each HS curls 25lb 2x10 Leg Ext 10lb 2x10  05/25/24  Scifit bike L4x8 minutes for w/u  Tandem stance 3x30 seconds B solid surface   5xSTS, TUG, goals, education on HEP   Lumbar rotation stretch 6x3 seconds  SKTC 5x5 seconds B Bridges x10      05/18/24:  Recumbent cycle level 4 x 6 min Standing with 1 # cuff wts each ankle, alt hip abd, 20x No UE support Standing alt forward toe taps to 8 surface 20 x with 1# wts ankles no UE support 5 x sit to stand, 15 sec  B Hamstring curls 25# 15 reps x 2 B Knee ext 10# 15 reps x 2 In ll bars for heel toe rocks on airex, 20x In ll bars on rocker board, for for /back rocks 20 x Dribbling 65 cm green physioball one lap in clinic dribbling Leg press B 20# 10x, 30# 10x 2     PATIENT EDUCATION:  Education details: exam findings, POC Person educated: Patient Education method: Explanation, Demonstration, and Handouts Education comprehension: verbalized understanding,  returned demonstration, and needs further education  HOME EXERCISE PROGRAM:  Access Code: [email or web address] URL: [email or web address] Date: 05/25/2024 Prepared by: Josette Rough  Exercises - Supine Lower Trunk Rotation  - 2 x daily - 7 x weekly - 1 sets - 10 reps - 5 seconds  hold - Supine Single Knee to Chest  - 2 x daily - 7 x weekly - 1 sets - 10 reps - 5 seconds  hold - Supine Bridge  - 2 x daily - 5 x weekly - 1 sets - 10 reps - Seated Quadratus Lumborum Stretch in Chair  - 2 x daily - 7 x weekly - 1 sets - 6 reps - 30 seconds  hold - Standing Tandem Balance with Counter Support  - 1 x daily - 7 x weekly - 1 sets - 6 reps - 30 seconds  hold   ASSESSMENT:  CLINICAL IMPRESSION:    Again pt arrive doing well with little low back pain. Again objective measures taken, py has increased both DGI and PSFS. Goals have been met.    Tactiel cues for posture needed with shoulder Ext.  Increase fatigue during sit to stands. All interventions completed well. Contiues to have ongoing back pain at times and decrease standing tolerance. Will continue to challenge him.     EVAL: Patient is a 78 y.o. M who was seen today for physical therapy evaluation and treatment for  Diagnosis  R26.89 (ICD-10-CM) - Imbalance  . Arrived late to eval, very pleasant and cooperative but very talkative to so exam  was very limited. Will make every effort to address functional concerns and reduce fall risk moving forward.   OBJECTIVE IMPAIRMENTS: Abnormal gait, decreased activity tolerance, decreased balance, decreased coordination, decreased knowledge of use of DME, decreased mobility, difficulty walking, decreased strength, and pain.   ACTIVITY LIMITATIONS: standing, squatting, stairs, transfers, and locomotion level  PARTICIPATION LIMITATIONS: driving, community activity, occupation, and yard work  PERSONAL FACTORS: Age, Behavior pattern, Fitness, Past/current experiences, Social background, and Time  since onset of injury/illness/exacerbation are also affecting patient's functional outcome.   REHAB POTENTIAL: Good  CLINICAL DECISION MAKING: Stable/uncomplicated  EVALUATION COMPLEXITY: Low   GOALS: Goals reviewed with patient? No  SHORT TERM GOALS: Target date: 06/01/2024    Will be compliant with appropriate progressive HEP  Baseline: Goal status: ONGOING 05/25/24 assigned initial program   2.  Will be able to name 3 ways to reduce fall risk at home and in community  Baseline:  Goal status: ONGOING 05/25/24, Met 06/02/24  3.  Will complete TUG in 15 seconds or less no device  Baseline:  Goal status: MET 05/25/24  4.  Will complete 5xSTS in 15 seconds no UEs  Baseline:  Goal status: MET 05/25/24    LONG TERM GOALS: Target date: 06/29/2024    MMT to have improved by at least 1 grade in all weak groups  Baseline:  Goal status: INITIAL  2.  Will score at least 18 on DGI  Baseline:  Goal status: 06/06/24 Met 21/30   3.  Will able to ambulate in community with no more than baseline LRAD as desired without difficulty  Baseline:  Goal status: Met 06/02/24 with back pain  4.  Will be able to perform standing tasks for longer than 1 hour Baseline:  Goal status: Progressing Guess I could but don't usually try 06/02/24  5.  PSFS to have improved by at least 3 points  Baseline:  Goal status: Progressing 5     PLAN:  PT FREQUENCY: 2x/week  PT DURATION: 8 weeks  PLANNED INTERVENTIONS: 97750- Physical Performance Testing, 97110-Therapeutic exercises, 97530- Therapeutic activity, [id number]- Neuromuscular re-education, 97535- Self Care, [id number]- Manual therapy, [id number]- Gait training, and DME instructions  PLAN FOR NEXT SESSION: balance, functional activity tolerance, strength; how does HEP feel?   Tanda Sorrow, PTA 06/06/24 2:34 PM  I concur with the above findings for 10th visit progress note Amy Speaks, PT, DPT, OCS

## 2024-06-08 ENCOUNTER — Encounter: Payer: Self-pay | Admitting: Physical Therapy

## 2024-06-08 ENCOUNTER — Ambulatory Visit: Admitting: Physical Therapy

## 2024-06-08 DIAGNOSIS — R262 Difficulty in walking, not elsewhere classified: Secondary | ICD-10-CM

## 2024-06-08 DIAGNOSIS — M6281 Muscle weakness (generalized): Secondary | ICD-10-CM

## 2024-06-08 DIAGNOSIS — R2681 Unsteadiness on feet: Secondary | ICD-10-CM | POA: Diagnosis not present

## 2024-06-08 DIAGNOSIS — R296 Repeated falls: Secondary | ICD-10-CM | POA: Diagnosis not present

## 2024-06-08 NOTE — Therapy (Signed)
 OUTPATIENT PHYSICAL THERAPY LOWER EXTREMITY TREATMENT      Patient Name: [name] HIEMSTRA MRN: [id number] DOB:[birth year], 78 y.o., male Today's Date: 06/08/2024  END OF SESSION:  PT End of Session - 06/08/24 1514     Visit Number 11    Number of Visits 17    Date for PT Re-Evaluation 06/29/24    Authorization Type MCR and BCBS    Authorization Time Period 05/04/24 to 06/29/24    Progress Note Due on Visit 20    PT Start Time 1431    PT Stop Time 1511    PT Time Calculation (min) 40 min    Activity Tolerance Patient tolerated treatment well    Behavior During Therapy WFL for tasks assessed/performed                Past Medical History:  Diagnosis Date   Arthritis    WRIST   Benign localized prostatic hyperplasia with lower urinary tract symptoms (LUTS)    Complication of anesthesia    slow to wake   ED (erectile dysfunction)    GERD (gastroesophageal reflux disease)    History of concussion    AS CHILD--  NO RESIDUAL   History of gout    History of kidney stones    Hyperlipidemia    Inguinal hernia, bilateral    Nephrolithiasis    BILATERAL   PAF (paroxysmal atrial fibrillation) (HCC) currently being followed by pcp   EPISODE --  2011  FOLLOW-UP W/ DR WALL  /  HAS NOT SEEN ANY CARDIOLOGIST SINCE   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 Cindie   Past Surgical History:  Procedure Laterality Date   APPENDECTOMY  1983   CARDIOVASCULAR STRESS TEST  05-08-2010  DR WALL   NO EVIDENCE OF SCAR OR ISCHEMIA/ EF 54%/  PT HAD BOTH AFIB/ AFLUTTER DURING STUDY   CARPAL TUNNEL RELEASE Right 01/ 14/ 2021   CORONARY STENT INTERVENTION N/A 05/02/2022   Procedure: CORONARY STENT INTERVENTION;  Surgeon: Verlin Lonni BIRCH, MD;  Location: [facility];  Service: Cardiovascular;  Laterality: N/A;   CORONARY/GRAFT ACUTE MI REVASCULARIZATION N/A 05/01/2022   Procedure: Coronary/Graft Acute MI  Revascularization;  Surgeon: Verlin Lonni BIRCH, MD;  Location: [facility];  Service: Cardiovascular;  Laterality: N/A;   CYSTOSCOPY W/ URETERAL STENT PLACEMENT Left 09/20/2013   Procedure: CYSTOSCOPY WITH STENT REPLACEMENT;  Surgeon: Donnice Gwenyth Brooks, MD;  Location: [facility];  Service: Urology;  Laterality: Left;   CYSTOSCOPY WITH URETEROSCOPY Left 09/20/2013   Procedure: CYSTOSCOPY WITH URETEROSCOPY;  Surgeon: Donnice Gwenyth Brooks, MD;  Location: [facility];  Service: Urology;  Laterality: Left;   CYSTOSCOPY WITH URETEROSCOPY AND STENT PLACEMENT Left 07/12/2013   Procedure: CYSTOSCOPY WITH LITHOLAPEXY, LEFT URETERAL STENT PLACEMENT;  Surgeon: Donnice Gwenyth Brooks, MD;  Location: [facility];  Service: Urology;  Laterality: Left;   CYSTOSCOPY WITH URETEROSCOPY AND STENT PLACEMENT Bilateral 06/23/2014   Procedure: BILATERAL URETEROSCOPY, HOLMIUM LASER LITHOTRIPSY AND STENT PLACEMENT, RIGHT ;  Surgeon: Donnice Brooks, MD;  Location: [facility];  Service: Urology;  Laterality: Bilateral;   EXTRACORPOREAL SHOCK WAVE LITHOTRIPSY  X2   EXTRACORPOREAL SHOCK WAVE LITHOTRIPSY Left 08-29-2013;   07-28-2013   EXTRACORPOREAL SHOCK WAVE LITHOTRIPSY Right 12/09/2018   Procedure: EXTRACORPOREAL SHOCK WAVE LITHOTRIPSY (ESWL);  Surgeon: Brooks Donnice, MD;  Location: [facility];  Service: Urology;  Laterality: Right;   HOLMIUM LASER APPLICATION  Left 09/20/2013   Procedure: HOLMIUM LASER APPLICATION;  Surgeon: Donnice Gwenyth Brooks, MD;  Location: [facility];  Service: Urology;  Laterality: Left;   INGUINAL HERNIA REPAIR Bilateral 10/16/2020   Procedure: BILATERAL OPEN INGUINAL HERNIA REPAIR WITH MESH;  Surgeon: Vernetta Berg, MD;  Location: [facility];  Service: General;  Laterality: Bilateral;  LMA/TAP BLOCK   LAPAROSCOPIC INGUINAL HERNIA REPAIR Bilateral 09/04/2010   w/ mesh   LEFT  ATRIAL APPENDAGE OCCLUSION N/A 07/17/2022   Procedure: LEFT ATRIAL APPENDAGE OCCLUSION;  Surgeon: Cindie Ole DASEN, MD;  Location: [facility];  Service: Cardiovascular;  Laterality: N/A;   LEFT HEART CATH AND CORONARY ANGIOGRAPHY N/A 05/01/2022   Procedure: LEFT HEART CATH AND CORONARY ANGIOGRAPHY;  Surgeon: Verlin Lonni BIRCH, MD;  Location: [facility];  Service: Cardiovascular;  Laterality: N/A;   TEE WITHOUT CARDIOVERSION N/A 07/17/2022   Procedure: TRANSESOPHAGEAL ECHOCARDIOGRAM (TEE);  Surgeon: Cindie Ole DASEN, MD;  Location: [facility];  Service: Cardiovascular;  Laterality: N/A;   TONSILLECTOMY  AS CHILD   TOTAL HIP ARTHROPLASTY Left 05/11/2002   TOTAL HIP ARTHROPLASTY Right 11/15/2012   Procedure: RIGHT TOTAL HIP ARTHROPLASTY ANTERIOR APPROACH;  Surgeon: Oneil JAYSON Herald, MD;  Location: [facility];  Service: Orthopedics;  Laterality: Right;  Right total hip arthroplasty   TRANSTHORACIC ECHOCARDIOGRAM  05/08/2010   NORMAL LVSF/  EF 55%/  GRADE I DIASTOLIC DYSFUNCTION/  MILD BILATERAL ATRIUM ENLARGEMENT   Watchemn device  07/07/2022   Patient Active Problem List   Diagnosis Date Noted   Pain of left hand 01/20/2024   Arthritis of carpometacarpal (CMC) joint of left thumb 01/20/2024   Posterior tibial tendonitis 08/06/2022   Presence of Watchman left atrial appendage closure device 07/17/2022   Spinal stenosis of lumbar region 06/04/2022   Hematuria 05/16/2022   Coronary artery disease involving native coronary artery of native heart with unstable angina pectoris (HCC)    STEMI (ST elevation myocardial infarction) (HCC) 05/01/2022   Acute ST elevation myocardial infarction (STEMI) of inferior wall (HCC)    Recurrent kidney stones 08/14/2021   History of cerebrovascular accident (CVA) with residual deficit 02/11/2021   CVA (cerebral vascular accident) (HCC) 10/21/2020   Bilateral primary osteoarthritis of knee 02/15/2020   Acquired trigger finger of right  little finger 11/03/2019   Encounter for orthopedic follow-up care 10/13/2019   H/O total hip arthroplasty, bilateral 10/24/2018   Gout 08/18/2017   Overweight (BMI 25.0-29.9) 01/23/2017   Foot pain, left 02/11/2016   Sebaceous cyst 07/14/2014   Sun-damaged skin 07/14/2014   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/14/2014   Physical exam, annual 05/17/2012   Inguinal hernia 08/12/2010   HYPERLIPIDEMIA TYPE I / IV 05/01/2010   Paroxysmal a-fib (HCC) 05/01/2010   ABNORMAL ELECTROCARDIOGRAM 03/26/2010    PCP: Mahlon Crank MD   REFERRING PROVIDER: Georgina Ozell LABOR, MD  REFERRING DIAG:  Diagnosis  R26.89 (ICD-10-CM) - Imbalance    THERAPY DIAG:  Repeated falls  Difficulty in walking, not elsewhere classified  Unsteadiness on feet  Muscle weakness (generalized)  Rationale for Evaluation and Treatment: Rehabilitation  ONSET DATE: early May 2025  SUBJECTIVE:   SUBJECTIVE STATEMENT:  Doing OK today, feeling more unsteady than my usual. Not dizzy. Back still hurts at times, getting another injection later this month but still working on getting things scheduled.     Eval:Had been to the beach in early may with tennis and dancing and what not, fell on May 9th and then again when  using just a cane. This progressed to not being able to walk period, had a lot of back pain and went the ED, got pain medicine and tx for bronchitis. Eventually ended up getting MRI which led to lumbar injections, this found that there was a disc and nerves being pinched or something. Injections helped a lot but now my main concern is imbalance that has been ongoing but slowly started getting better in past 2 weeks.   PERTINENT HISTORY:  See above  PAIN:  Are you having pain? 2/10 low back down the backs of the legs to just above the knees Not sharp but makes it uncomfortable to walk  Laying down, ibuprofen  make it better Yard work/picking up something heavy  can make it worse   PRECAUTIONS: Fall  RED  FLAGS: None   WEIGHT BEARING RESTRICTIONS: No  FALLS:  Has patient fallen in last 6 months? Yes. 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  DIAGNOSTIC FINDINGS:   CLINICAL DATA:  78 year old male with low back pain radiating down both legs since 02/04/2024. multiple falls.   EXAM: MRI LUMBAR SPINE WITHOUT CONTRAST   TECHNIQUE: Multiplanar, multisequence MR imaging of the lumbar spine was performed. No intravenous contrast was administered.   COMPARISON:  Lumbar spine CT 02/15/2024.  Lumbar MRI 05/13/2022.   FINDINGS: Segmentation: Normal on the comparison, the same numbering system used on the previous MRI.   Alignment: Stable since 2023. Subtle chronic grade 1 anterolisthesis of L3 on L4, mild at L5-S1. Underlying mild S-shaped lumbar scoliosis.   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 New degenerative anterior endplate marrow edema at T12-L1 (series 105, image 8).   Normal background bone marrow signal and maintained vertebral height. Intact visible sacrum and SI joints. No other acute osseous abnormality.   Conus medullaris and cauda equina: Conus extends to the L1 level. No lower spinal cord or conus signal abnormality.   Paraspinal and other soft tissues: Stable, negative Visualized abdominal viscera and  paraspinal soft tissues.   Partially visible distended urinary bladder (series 105, image 4).   Disc levels:   Compared to the 2023 MRI:   T11-T12: Stable mild facet hypertrophy on the right without stenosis.   T12-L1: Progressed anterior disc space loss and new vacuum disc (pronounced vacuum phenomena on the recent CT). Stable mild disc bulging without stenosis.   L1-L2: Interbody ankylosis now. Endplate spurring. Moderate chronic left L1 foraminal stenosis. No spinal stenosis.   L2-L3: Minor disc bulging. Stable mild facet and ligament flavum hypertrophy. No stenosis.   L3-L4: Progressed and now moderate to severe spinal and right lateral recess (right L4 nerve level) stenosis since 2023 in the setting of mild chronic anterolisthesis. Asymmetric disc/pseudo disc to the right with severe ligament flavum hypertrophy. Moderate to severe facet hypertrophy. Decreased facet joint fluid at this level. Moderate left L3 foraminal stenosis also appears  mildly progressed.   L4-L5: Chronic disc space loss with increased circumferential disc bulge asymmetric to the right, moderate facet and ligament flavum hypertrophy. Increased mild spinal and moderate right lateral recess stenosis (right L5 nerve level series 106 image 33). Moderate right L4 neural foraminal stenosis also appears increased.   L5-S1: Stable mild disc/pseudo disc bulging. Moderate chronic facet degeneration and degenerative facet joint fluid. The left foraminal synovial cyst is less apparent on series 106, image 33. No convincing stenosis now.   IMPRESSION: 1. 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 - progressed and  now moderate to severe multifactorial spinal and right lateral recess stenosis at L3-L4. Progressed moderate left foraminal stenosis. - progressed mild spinal and moderate multifactorial right lateral recess and right foraminal stenosis at L4-L5.   2. Chronic L5-S1 facet arthropathy, but a left foraminal synovial cysts there seems regressed.   3. Distended urinary bladder.  Query urinary retention.   XRs of the lumbar spine from 03/02/2024 were independently reviewed and  interpreted, showing lumbar scoliotic curvature that measures 17 degrees  with apex to the right.  Disc height loss at L3/4 and L4/5.  No other  significant degenerative changes.  No evidence of instability on  flexion/extension views.  No fracture or dislocation seen.      PATIENT SURVEYS:  PSFS: THE PATIENT SPECIFIC FUNCTIONAL SCALE  Place score of 0-10 (0 = unable to perform activity and 10 = able to perform activity at the same level as before injury or problem)  Activity Date: 05/04/24 06/06/24   Walking with no device  4 6   2. Standing for a long time (>45 minutes) 2 6   3. Balance/steadiness in general  3 4   4.      Total Score 3 5     Total Score = Sum of activity scores/number of activities  Minimally Detectable Change: 3 points (for single activity); 2 points (for average score)  [facility] (nd). The Patient Specific Functional Scale . Retrieved from [email or web address]   COGNITION: Overall cognitive status: Within functional limits for tasks assessed        LOWER EXTREMITY MMT:  MMT Right eval Left eval Right 9/425 Left 06/02/24  Hip flexion 4 4 5 5   Hip extension      Hip abduction 4- seated  4- seated  4+ 4+  Hip adduction      Hip internal rotation      Hip external rotation      Knee flexion 4+ 4+ 4+ 4+  Knee extension 4+ 4+ 5 5  Ankle dorsiflexion 4+ 4+ 5   Ankle plantarflexion      Ankle inversion      Ankle  eversion       (Blank rows = not tested)    FUNCTIONAL TESTS:  5 times sit to stand: 18.5 seconds no UEs; 05/25/24  13.8 seconds no UEs  Timed up and go (TUG): 21 seconds no device; 05/25/24 11.4 seconds   3 minute walk test: 466ft no device min guard   GAIT: Distance walked: 419ft Assistive device utilized: None Level of assistance: CGA Comments: more steady at first, got a lot more unsteady as fatigued increased, did need light MinA on turns towards end of test  TREATMENT DATE:    06/08/24  Nustep L5x8 minutes seat 8, all four extremities   Tandem stance solid surface 3x30 seconds B, attempted blue foam but unable Tandem walks // bars x4 laps solid surface  One foot on BOSU/other on ground 3x30 seconds B  Alternating toe taps to top of BOSU x20  Side steps blue foam pad x4 laps // bars     06/06/24 Bike L4 x 6 min  PSFS 5 DGI 21/30 Sit to stand OHP yellow ball 2x10  Shoulder Ext 5lb 2x10 Standing rows 10lb 2x10 6in step ups x10 each  06/02/24 NuStep L5 x 6 min Goals Gait around the widest part of back building in parking lot   Strenuous at the end going up hill Rows and lats 25lb 2x10 Shoulder Ext 5lb 2x10  05/31/24 NuStep L 5 x 6 min Shoulder Ext 5lb 2x10 Standing rows 10lb 2x10 Sit to stand OHP yellow ball 2x10  HS curls 25lb 2x10 Leg Ext 10lb 2x10 Bridges x 10 Passive stretching to bilateral HS, piriformis, ITB       PATIENT EDUCATION:  Education details: exam findings, POC Person educated: Patient Education method: Programmer, multimedia, Demonstration, and Handouts Education comprehension: verbalized understanding, returned demonstration, and needs further education  HOME EXERCISE PROGRAM:  Access Code: [email or web address] URL: [email or web address] Date: 05/25/2024 Prepared by: Josette Rough  Exercises - Supine Lower  Trunk Rotation  - 2 x daily - 7 x weekly - 1 sets - 10 reps - 5 seconds  hold - Supine Single Knee to Chest  - 2 x daily - 7 x weekly - 1 sets - 10 reps - 5 seconds  hold - Supine Bridge  - 2 x daily - 5 x weekly - 1 sets - 10 reps - Seated Quadratus Lumborum Stretch in Chair  - 2 x daily - 7 x weekly - 1 sets - 6 reps - 30 seconds  hold - Standing Tandem Balance with Counter Support  - 1 x daily - 7 x weekly - 1 sets - 6 reps - 30 seconds  hold   ASSESSMENT:  CLINICAL IMPRESSION:        EVAL: Patient is a 78 y.o. M who was seen today for physical therapy evaluation and treatment for  Diagnosis  R26.89 (ICD-10-CM) - Imbalance  . Arrived late to eval, very pleasant and cooperative but very talkative to so exam was very limited. Will make every effort to address functional concerns and reduce fall risk moving forward.   OBJECTIVE IMPAIRMENTS: Abnormal gait, decreased activity tolerance, decreased balance, decreased coordination, decreased knowledge of use of DME, decreased mobility, difficulty walking, decreased strength, and pain.   ACTIVITY LIMITATIONS: standing, squatting, stairs, transfers, and locomotion level  PARTICIPATION LIMITATIONS: driving, community activity, occupation, and yard work  PERSONAL FACTORS: Age, Behavior pattern, Fitness, Past/current experiences, Social background, and Time since onset of injury/illness/exacerbation are also affecting patient's functional outcome.   REHAB POTENTIAL: Good  CLINICAL DECISION MAKING: Stable/uncomplicated  EVALUATION COMPLEXITY: Low   GOALS: Goals reviewed with patient? No  SHORT TERM GOALS: Target date: 06/01/2024    Will be compliant with appropriate progressive HEP  Baseline: Goal status: ONGOING 05/25/24 assigned initial program   2.  Will be able to name 3 ways to reduce fall risk at home and in community  Baseline:  Goal status: ONGOING 05/25/24, Met 06/02/24  3.  Will complete TUG in 15 seconds or less no device   Baseline:  Goal status: MET 05/25/24  4.  Will  complete 5xSTS in 15 seconds no UEs  Baseline:  Goal status: MET 05/25/24    LONG TERM GOALS: Target date: 06/29/2024    MMT to have improved by at least 1 grade in all weak groups  Baseline:  Goal status: INITIAL  2.  Will score at least 18 on DGI  Baseline:  Goal status: 06/06/24 Met 21/30   3.  Will able to ambulate in community with no more than baseline LRAD as desired without difficulty  Baseline:  Goal status: Met 06/02/24 with back pain  4.  Will be able to perform standing tasks for longer than 1 hour Baseline:  Goal status: Progressing Guess I could but don't usually try 06/02/24  5.  PSFS to have improved by at least 3 points  Baseline:  Goal status: Progressing 5     PLAN:  PT FREQUENCY: 2x/week  PT DURATION: 8 weeks  PLANNED INTERVENTIONS: 97750- Physical Performance Testing, 97110-Therapeutic exercises, 97530- Therapeutic activity, [id number]- Neuromuscular re-education, 97535- Self Care, [id number]- Manual therapy, [phone]- Gait training, and DME instructions  PLAN FOR NEXT SESSION: balance, functional activity tolerance, strength  Josette Rough, PT, DPT 06/08/24 3:14 PM

## 2024-06-14 ENCOUNTER — Encounter: Payer: Self-pay | Admitting: Physical Therapy

## 2024-06-14 ENCOUNTER — Ambulatory Visit: Admitting: Physical Therapy

## 2024-06-14 DIAGNOSIS — R296 Repeated falls: Secondary | ICD-10-CM | POA: Diagnosis not present

## 2024-06-14 DIAGNOSIS — R2681 Unsteadiness on feet: Secondary | ICD-10-CM

## 2024-06-14 DIAGNOSIS — R262 Difficulty in walking, not elsewhere classified: Secondary | ICD-10-CM | POA: Diagnosis not present

## 2024-06-14 DIAGNOSIS — M6281 Muscle weakness (generalized): Secondary | ICD-10-CM | POA: Diagnosis not present

## 2024-06-14 NOTE — Therapy (Signed)
 OUTPATIENT PHYSICAL THERAPY LOWER EXTREMITY TREATMENT      Patient Name: [name] Short MRN: [id number] DOB:Mar 22, 1946, 78 y.o., male Today's Date: 06/14/2024  END OF SESSION:  PT End of Session - 06/14/24 1519     Visit Number 12    Date for PT Re-Evaluation 06/29/24    PT Start Time 1517    PT Stop Time 1600    PT Time Calculation (min) 43 min    Activity Tolerance Patient tolerated treatment well    Behavior During Therapy WFL for tasks assessed/performed                Past Medical History:  Diagnosis Date   Arthritis    WRIST   Benign localized prostatic hyperplasia with lower urinary tract symptoms (LUTS)    Complication of anesthesia    slow to wake   ED (erectile dysfunction)    GERD (gastroesophageal reflux disease)    History of concussion    AS CHILD--  NO RESIDUAL   History of gout    History of kidney stones    Hyperlipidemia    Inguinal hernia, bilateral    Nephrolithiasis    BILATERAL   PAF (paroxysmal atrial fibrillation) (HCC) currently being followed by pcp   EPISODE --  2011  FOLLOW-UP W/ DR WALL  /  HAS NOT SEEN ANY CARDIOLOGIST SINCE   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 Cindie   Past Surgical History:  Procedure Laterality Date   APPENDECTOMY  1983   CARDIOVASCULAR STRESS TEST  05-08-2010  DR WALL   NO EVIDENCE OF SCAR OR ISCHEMIA/ EF 54%/  PT HAD BOTH AFIB/ AFLUTTER DURING STUDY   CARPAL TUNNEL RELEASE Right 01/ 14/ 2021   CORONARY STENT INTERVENTION N/A 05/02/2022   Procedure: CORONARY STENT INTERVENTION;  Surgeon: Verlin Lonni BIRCH, MD;  Location: [facility];  Service: Cardiovascular;  Laterality: N/A;   CORONARY/GRAFT ACUTE MI REVASCULARIZATION N/A 05/01/2022   Procedure: Coronary/Graft Acute MI Revascularization;  Surgeon: Verlin Lonni BIRCH, MD;  Location: [facility];  Service: Cardiovascular;  Laterality: N/A;   CYSTOSCOPY W/  URETERAL STENT PLACEMENT Left 09/20/2013   Procedure: CYSTOSCOPY WITH STENT REPLACEMENT;  Surgeon: Donnice Gwenyth Brooks, MD;  Location: [facility];  Service: Urology;  Laterality: Left;   CYSTOSCOPY WITH URETEROSCOPY Left 09/20/2013   Procedure: CYSTOSCOPY WITH URETEROSCOPY;  Surgeon: Donnice Gwenyth Brooks, MD;  Location: [facility];  Service: Urology;  Laterality: Left;   CYSTOSCOPY WITH URETEROSCOPY AND STENT PLACEMENT Left 07/12/2013   Procedure: CYSTOSCOPY WITH LITHOLAPEXY, LEFT URETERAL STENT PLACEMENT;  Surgeon: Donnice Gwenyth Brooks, MD;  Location: [facility];  Service: Urology;  Laterality: Left;   CYSTOSCOPY WITH URETEROSCOPY AND STENT PLACEMENT Bilateral 06/23/2014   Procedure: BILATERAL URETEROSCOPY, HOLMIUM LASER LITHOTRIPSY AND STENT PLACEMENT, RIGHT ;  Surgeon: Donnice Brooks, MD;  Location: [facility];  Service: Urology;  Laterality: Bilateral;   EXTRACORPOREAL SHOCK WAVE LITHOTRIPSY  X2   EXTRACORPOREAL SHOCK WAVE LITHOTRIPSY Left 08-29-2013;   07-28-2013   EXTRACORPOREAL SHOCK WAVE LITHOTRIPSY Right 12/09/2018   Procedure: EXTRACORPOREAL SHOCK WAVE LITHOTRIPSY (ESWL);  Surgeon: Brooks Donnice, MD;  Location: [facility];  Service: Urology;  Laterality: Right;   HOLMIUM LASER APPLICATION Left 09/20/2013   Procedure: HOLMIUM LASER APPLICATION;  Surgeon: Donnice Gwenyth Brooks, MD;  Location: [facility];  Service: Urology;  Laterality: Left;   INGUINAL HERNIA REPAIR Bilateral 10/16/2020  Procedure: BILATERAL OPEN INGUINAL HERNIA REPAIR WITH MESH;  Surgeon: Vernetta Berg, MD;  Location: [facility];  Service: General;  Laterality: Bilateral;  LMA/TAP BLOCK   LAPAROSCOPIC INGUINAL HERNIA REPAIR Bilateral 09/04/2010   w/ mesh   LEFT ATRIAL APPENDAGE OCCLUSION N/A 07/17/2022   Procedure: LEFT ATRIAL APPENDAGE OCCLUSION;  Surgeon: Cindie Ole DASEN, MD;  Location: [facility];  Service: Cardiovascular;  Laterality: N/A;   LEFT HEART CATH AND CORONARY ANGIOGRAPHY N/A 05/01/2022   Procedure: LEFT HEART CATH AND CORONARY ANGIOGRAPHY;  Surgeon: Verlin Lonni BIRCH, MD;  Location: [facility];  Service: Cardiovascular;  Laterality: N/A;   TEE WITHOUT CARDIOVERSION N/A 07/17/2022   Procedure: TRANSESOPHAGEAL ECHOCARDIOGRAM (TEE);  Surgeon: Cindie Ole DASEN, MD;  Location: [facility];  Service: Cardiovascular;  Laterality: N/A;   TONSILLECTOMY  AS CHILD   TOTAL HIP ARTHROPLASTY Left 05/11/2002   TOTAL HIP ARTHROPLASTY Right 11/15/2012   Procedure: RIGHT TOTAL HIP ARTHROPLASTY ANTERIOR APPROACH;  Surgeon: Oneil JAYSON Herald, MD;  Location: [facility];  Service: Orthopedics;  Laterality: Right;  Right total hip arthroplasty   TRANSTHORACIC ECHOCARDIOGRAM  05/08/2010   NORMAL LVSF/  EF 55%/  GRADE I DIASTOLIC DYSFUNCTION/  MILD BILATERAL ATRIUM ENLARGEMENT   Watchemn device  07/07/2022   Patient Active Problem List   Diagnosis Date Noted   Pain of left hand 01/20/2024   Arthritis of carpometacarpal (CMC) joint of left thumb 01/20/2024   Posterior tibial tendonitis 08/06/2022   Presence of Watchman left atrial appendage closure device 07/17/2022   Spinal stenosis of lumbar region 06/04/2022   Hematuria 05/16/2022   Coronary artery disease involving native coronary artery of native heart with unstable angina pectoris (HCC)    STEMI (ST elevation myocardial infarction) (HCC) 05/01/2022   Acute ST elevation myocardial infarction (STEMI) of inferior wall (HCC)    Recurrent kidney stones 08/14/2021   History of cerebrovascular accident (CVA) with residual deficit 02/11/2021   CVA (cerebral vascular accident) (HCC) 10/21/2020   Bilateral primary osteoarthritis of knee 02/15/2020   Acquired trigger finger of right little finger 11/03/2019   Encounter for orthopedic follow-up care 10/13/2019   H/O total hip arthroplasty, bilateral 10/24/2018   Gout 08/18/2017    Overweight (BMI 25.0-29.9) 01/23/2017   Foot pain, left 02/11/2016   Sebaceous cyst 07/14/2014   Sun-damaged skin 07/14/2014   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/14/2014   Physical exam, annual 05/17/2012   Inguinal hernia 08/12/2010   HYPERLIPIDEMIA TYPE I / IV 05/01/2010   Paroxysmal a-fib (HCC) 05/01/2010   ABNORMAL ELECTROCARDIOGRAM 03/26/2010    PCP: Mahlon Crank MD   REFERRING PROVIDER: Georgina Ozell LABOR, MD  REFERRING DIAG:  Diagnosis  R26.89 (ICD-10-CM) - Imbalance    THERAPY DIAG:  Repeated falls  Difficulty in walking, not elsewhere classified  Unsteadiness on feet  Muscle weakness (generalized)  Rationale for Evaluation and Treatment: Rehabilitation  ONSET DATE: early May 2025  SUBJECTIVE:   SUBJECTIVE STATEMENT:  Tried to pick up an old sign that was hung up in tall grass, pulled to hard and had difficuty walking after wards. Difficulty standing up.     Eval:Had been to the beach in early may with tennis and dancing and what not, fell on May 9th and then again when using just a cane. This progressed to not being able to walk period, had a lot of back pain and went the ED, got pain medicine and tx for bronchitis. Eventually ended up getting MRI which led to  lumbar injections, this found that there was a disc and nerves being pinched or something. Injections helped a lot but now my main concern is imbalance that has been ongoing but slowly started getting better in past 2 weeks.   PERTINENT HISTORY:  See above  PAIN:  Are you having pain? 3-4/10 low back  Not sharp but makes it uncomfortable to walk  Laying down, ibuprofen  make it better Yard work/picking up something heavy  can make it worse   PRECAUTIONS: Fall  RED FLAGS: None   WEIGHT BEARING RESTRICTIONS: No  FALLS:  Has patient fallen in last 6 months? Yes. 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  DIAGNOSTIC FINDINGS:   CLINICAL DATA:  78 year old male with low back pain radiating down both legs since 02/04/2024. multiple falls.   EXAM: MRI LUMBAR SPINE WITHOUT CONTRAST   TECHNIQUE: Multiplanar, multisequence MR imaging of the lumbar spine was performed. No intravenous contrast was administered.   COMPARISON:  Lumbar spine CT 02/15/2024.  Lumbar MRI 05/13/2022.   FINDINGS: Segmentation: Normal on the comparison, the same numbering system used on the previous MRI.   Alignment: Stable since 2023. Subtle chronic grade 1 anterolisthesis of L3 on L4, mild at L5-S1. Underlying mild S-shaped lumbar scoliosis.   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 New degenerative anterior endplate marrow edema at T12-L1 (series 105, image 8).   Normal background bone marrow signal and maintained vertebral height. Intact visible sacrum and SI joints. No other acute osseous abnormality.   Conus medullaris and cauda equina: Conus extends to the L1 level. No lower spinal cord or conus signal abnormality.   Paraspinal and other soft tissues: Stable, negative Visualized abdominal viscera and paraspinal soft tissues.   Partially visible distended urinary bladder (series 105, image 4).   Disc levels:   Compared to the 2023 MRI:   T11-T12: Stable mild facet hypertrophy on the right without stenosis.   T12-L1: Progressed  anterior disc space loss and new vacuum disc (pronounced vacuum phenomena on the recent CT). Stable mild disc bulging without stenosis.   L1-L2: Interbody ankylosis now. Endplate spurring. Moderate chronic left L1 foraminal stenosis. No spinal stenosis.   L2-L3: Minor disc bulging. Stable mild facet and ligament flavum hypertrophy. No stenosis.   L3-L4: Progressed and now moderate to severe spinal and right lateral recess (right L4 nerve level) stenosis since 2023 in the setting of mild chronic anterolisthesis. Asymmetric disc/pseudo disc to the right with severe ligament flavum hypertrophy. Moderate to severe facet hypertrophy. Decreased facet joint fluid at this level. Moderate left L3 foraminal stenosis also appears mildly progressed.   L4-L5: Chronic disc space loss with increased circumferential disc bulge asymmetric to the right, moderate facet and ligament flavum hypertrophy. Increased mild spinal and moderate right lateral recess stenosis (right L5 nerve level series 106 image 33). Moderate right L4 neural foraminal stenosis also  appears increased.   L5-S1: Stable mild disc/pseudo disc bulging. Moderate chronic facet degeneration and degenerative facet joint fluid. The left foraminal synovial cyst is less apparent on series 106, image 33. No convincing stenosis now.   IMPRESSION: 1. 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 - progressed and now moderate to severe multifactorial spinal and right lateral recess stenosis at L3-L4. Progressed moderate left foraminal stenosis. - progressed mild spinal and moderate multifactorial right lateral recess and right foraminal stenosis  at L4-L5.   2. Chronic L5-S1 facet arthropathy, but a left foraminal synovial cysts there seems regressed.   3. Distended urinary bladder.  Query urinary retention.   XRs of the lumbar spine from 03/02/2024 were independently reviewed and  interpreted, showing lumbar scoliotic curvature that measures 17 degrees  with apex to the right.  Disc height loss at L3/4 and L4/5.  No other  significant degenerative changes.  No evidence of instability on  flexion/extension views.  No fracture or dislocation seen.      PATIENT SURVEYS:  PSFS: THE PATIENT SPECIFIC FUNCTIONAL SCALE  Place score of 0-10 (0 = unable to perform activity and 10 = able to perform activity at the same level as before injury or problem)  Activity Date: 05/04/24 06/06/24   Walking with no device  4 6   2. Standing for a long time (>45 minutes) 2 6   3. Balance/steadiness in general  3 4   4.      Total Score 3 5     Total Score = Sum of activity scores/number of activities  Minimally Detectable Change: 3 points (for single activity); 2 points (for average score)  [facility] (nd). The Patient Specific Functional Scale . Retrieved from [email or web address]   COGNITION: Overall cognitive status: Within functional limits for tasks assessed        LOWER EXTREMITY MMT:  MMT Right eval Left eval Right 9/425 Left 06/02/24  Hip flexion 4 4 5 5   Hip extension      Hip abduction 4- seated  4- seated  4+ 4+  Hip adduction      Hip internal rotation      Hip external rotation      Knee flexion 4+ 4+ 4+ 4+  Knee extension 4+ 4+ 5 5  Ankle dorsiflexion 4+ 4+ 5   Ankle plantarflexion      Ankle inversion      Ankle eversion       (Blank rows = not tested)    FUNCTIONAL TESTS:  5 times sit to stand: 18.5 seconds no UEs; 05/25/24  13.8 seconds no UEs  Timed up and go (TUG): 21 seconds no device; 05/25/24 11.4 seconds   3 minute walk test: 46ft no  device min guard   GAIT: Distance walked: 446ft Assistive device utilized: None Level of assistance: CGA Comments: more steady at first, got a lot more unsteady as fatigued increased, did need light MinA on turns towards end of test  TREATMENT DATE:  06/14/24 Nustep L5x7 minutes seat 8, all four extremities  Seated Rows & Lats 25lb 2x10 Shoulder Ext 10lb 2x10 Sit to stand holding yellow ball 2x10 Seated OHP yellow ball 2x10 STM to lumbar spine w/ Tband  06/08/24  Nustep L5x8 minutes seat 8, all four extremities   Tandem stance solid surface 3x30 seconds B, attempted blue foam but unable Tandem walks // bars x4 laps solid surface  One foot on BOSU/other on ground 3x30 seconds B  Alternating toe taps to top of BOSU x20  Side steps blue foam pad x4 laps // bars     06/06/24 Bike L4 x 6 min  PSFS 5 DGI 21/30 Sit to stand OHP yellow ball 2x10  Shoulder Ext 5lb 2x10 Standing rows 10lb 2x10 6in step ups x10 each  06/02/24 NuStep L5 x 6 min Goals Gait around the widest part of back building in parking lot   Strenuous at the end going up hill Rows and lats 25lb 2x10 Shoulder Ext 5lb 2x10  05/31/24 NuStep L 5 x 6 min Shoulder Ext 5lb 2x10 Standing rows 10lb 2x10 Sit to stand OHP yellow ball 2x10  HS curls 25lb 2x10 Leg Ext 10lb 2x10 Bridges x 10 Passive stretching to bilateral HS, piriformis, ITB       PATIENT EDUCATION:  Education details: exam findings, POC Person educated: Patient Education method: Programmer, multimedia, Demonstration, and Handouts Education comprehension: verbalized understanding, returned demonstration, and needs further education  HOME EXERCISE PROGRAM:  Access Code: [email or web address] URL: [email or web address] Date: 05/25/2024 Prepared by: Josette Rough  Exercises - Supine Lower Trunk Rotation  - 2 x daily - 7 x  weekly - 1 sets - 10 reps - 5 seconds  hold - Supine Single Knee to Chest  - 2 x daily - 7 x weekly - 1 sets - 10 reps - 5 seconds  hold - Supine Bridge  - 2 x daily - 5 x weekly - 1 sets - 10 reps - Seated Quadratus Lumborum Stretch in Chair  - 2 x daily - 7 x weekly - 1 sets - 6 reps - 30 seconds  hold - Standing Tandem Balance with Counter Support  - 1 x daily - 7 x weekly - 1 sets - 6 reps - 30 seconds  hold   ASSESSMENT:  CLINICAL IMPRESSION:    EVAL: Patient is a 78 y.o. M who was seen today for physical therapy evaluation and treatment for R26.89 (ICD-10-CM) - Imbalance He enters with increase low back pain form trying to pick up a old sign. PT able to complete all intervention, some pain with sit to stands. Positive response to STM. Will make every effort to address functional concerns and reduce fall risk moving forward.   OBJECTIVE IMPAIRMENTS: Abnormal gait, decreased activity tolerance, decreased balance, decreased coordination, decreased knowledge of use of DME, decreased mobility, difficulty walking, decreased strength, and pain.   ACTIVITY LIMITATIONS: standing, squatting, stairs, transfers, and locomotion level  PARTICIPATION LIMITATIONS: driving, community activity, occupation, and yard work  PERSONAL FACTORS: Age, Behavior pattern, Fitness, Past/current experiences, Social background, and Time since onset of injury/illness/exacerbation are also affecting patient's functional outcome.   REHAB POTENTIAL: Good  CLINICAL DECISION MAKING: Stable/uncomplicated  EVALUATION COMPLEXITY: Low   GOALS: Goals reviewed with patient? No  SHORT TERM GOALS: Target date: 06/01/2024    Will be compliant with appropriate progressive HEP  Baseline: Goal status: ONGOING 05/25/24 assigned initial program   2.  Will be able to name 3 ways to  reduce fall risk at home and in community  Baseline:  Goal status: ONGOING 05/25/24, Met 06/02/24  3.  Will complete TUG in 15 seconds or less no  device  Baseline:  Goal status: MET 05/25/24  4.  Will complete 5xSTS in 15 seconds no UEs  Baseline:  Goal status: MET 05/25/24    LONG TERM GOALS: Target date: 06/29/2024    MMT to have improved by at least 1 grade in all weak groups  Baseline:  Goal status: INITIAL  2.  Will score at least 18 on DGI  Baseline:  Goal status: 06/06/24 Met 21/30   3.  Will able to ambulate in community with no more than baseline LRAD as desired without difficulty  Baseline:  Goal status: Met 06/02/24 with back pain  4.  Will be able to perform standing tasks for longer than 1 hour Baseline:  Goal status: Progressing Guess I could but don't usually try 06/02/24  5.  PSFS to have improved by at least 3 points  Baseline:  Goal status: Progressing 5     PLAN:  PT FREQUENCY: 2x/week  PT DURATION: 8 weeks  PLANNED INTERVENTIONS: 97750- Physical Performance Testing, 97110-Therapeutic exercises, 97530- Therapeutic activity, [id number]- Neuromuscular re-education, 97535- Self Care, [id number]- Manual therapy, [id number]- Gait training, and DME instructions  PLAN FOR NEXT SESSION: balance, functional activity tolerance, strength  Josette Rough, PT, DPT 06/14/24 3:20 PM

## 2024-06-16 ENCOUNTER — Encounter: Payer: Self-pay | Admitting: Physical Therapy

## 2024-06-16 ENCOUNTER — Ambulatory Visit: Admitting: Physical Therapy

## 2024-06-16 DIAGNOSIS — M6281 Muscle weakness (generalized): Secondary | ICD-10-CM | POA: Diagnosis not present

## 2024-06-16 DIAGNOSIS — R262 Difficulty in walking, not elsewhere classified: Secondary | ICD-10-CM

## 2024-06-16 DIAGNOSIS — R2681 Unsteadiness on feet: Secondary | ICD-10-CM | POA: Diagnosis not present

## 2024-06-16 DIAGNOSIS — R296 Repeated falls: Secondary | ICD-10-CM | POA: Diagnosis not present

## 2024-06-16 NOTE — Therapy (Signed)
 OUTPATIENT PHYSICAL THERAPY LOWER EXTREMITY TREATMENT      Patient Name: [name] Short MRN: [id number] DOB:1946-02-13, 78 y.o., male Today's Date: 06/16/2024  END OF SESSION:  PT End of Session - 06/16/24 1152     Visit Number 13    Date for Recertification  06/29/24    PT Start Time 1152    PT Stop Time 1230    PT Time Calculation (min) 38 min    Activity Tolerance Patient tolerated treatment well    Behavior During Therapy WFL for tasks assessed/performed                Past Medical History:  Diagnosis Date   Arthritis    WRIST   Benign localized prostatic hyperplasia with lower urinary tract symptoms (LUTS)    Complication of anesthesia    slow to wake   ED (erectile dysfunction)    GERD (gastroesophageal reflux disease)    History of concussion    AS CHILD--  NO RESIDUAL   History of gout    History of kidney stones    Hyperlipidemia    Inguinal hernia, bilateral    Nephrolithiasis    BILATERAL   PAF (paroxysmal atrial fibrillation) (HCC) currently being followed by pcp   EPISODE --  2011  FOLLOW-UP W/ DR WALL  /  HAS NOT SEEN ANY CARDIOLOGIST SINCE   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 Cindie   Past Surgical History:  Procedure Laterality Date   APPENDECTOMY  1983   CARDIOVASCULAR STRESS TEST  05-08-2010  DR WALL   NO EVIDENCE OF SCAR OR ISCHEMIA/ EF 54%/  PT HAD BOTH AFIB/ AFLUTTER DURING STUDY   CARPAL TUNNEL RELEASE Right 01/ 14/ 2021   CORONARY STENT INTERVENTION N/A 05/02/2022   Procedure: CORONARY STENT INTERVENTION;  Surgeon: Verlin Lonni BIRCH, MD;  Location: [facility];  Service: Cardiovascular;  Laterality: N/A;   CORONARY/GRAFT ACUTE MI REVASCULARIZATION N/A 05/01/2022   Procedure: Coronary/Graft Acute MI Revascularization;  Surgeon: Verlin Lonni BIRCH, MD;  Location: [facility];  Service: Cardiovascular;  Laterality: N/A;   CYSTOSCOPY W/  URETERAL STENT PLACEMENT Left 09/20/2013   Procedure: CYSTOSCOPY WITH STENT REPLACEMENT;  Surgeon: Donnice Gwenyth Brooks, MD;  Location[facility];  Service: Urology;  Laterality: Left;   CYSTOSCOPY WITH URETEROSCOPY Left 09/20/2013   Procedure: CYSTOSCOPY WITH URETEROSCOPY;  Surgeon: Donnice Gwenyth Brooks, MD;  Location: [facility];  Service: Urology;  Laterality: Left;   CYSTOSCOPY WITH URETEROSCOPY AND STENT PLACEMENT Left 07/12/2013   Procedure: CYSTOSCOPY WITH LITHOLAPEXY, LEFT URETERAL STENT PLACEMENT;  Surgeon: Donnice Gwenyth Brooks, MD;  Location: [facility];  Service: Urology;  Laterality: Left;   CYSTOSCOPY WITH URETEROSCOPY AND STENT PLACEMENT Bilateral 06/23/2014   Procedure: BILATERAL URETEROSCOPY, HOLMIUM LASER LITHOTRIPSY AND STENT PLACEMENT, RIGHT ;  Surgeon: Donnice Brooks, MD;  Location: [facility];  Service: Urology;  Laterality: Bilateral;   EXTRACORPOREAL SHOCK WAVE LITHOTRIPSY  X2   EXTRACORPOREAL SHOCK WAVE LITHOTRIPSY Left 08-29-2013;   07-28-2013   EXTRACORPOREAL SHOCK WAVE LITHOTRIPSY Right 12/09/2018   Procedure: EXTRACORPOREAL SHOCK WAVE LITHOTRIPSY (ESWL);  Surgeon: Brooks Donnice, MD;  Location: [facility];  Service: Urology;  Laterality: Right;   HOLMIUM LASER APPLICATION Left 09/20/2013   Procedure: HOLMIUM LASER APPLICATION;  Surgeon: Donnice Gwenyth Brooks, MD;  Location: [facility];  Service: Urology;  Laterality: Left;   INGUINAL HERNIA REPAIR Bilateral 10/16/2020  Procedure: BILATERAL OPEN INGUINAL HERNIA REPAIR WITH MESH;  Surgeon: Vernetta Berg, MD;  Location: [facility];  Service: General;  Laterality: Bilateral;  LMA/TAP BLOCK   LAPAROSCOPIC INGUINAL HERNIA REPAIR Bilateral 09/04/2010   w/ mesh   LEFT ATRIAL APPENDAGE OCCLUSION N/A 07/17/2022   Procedure: LEFT ATRIAL APPENDAGE OCCLUSION;  Surgeon: Cindie Ole DASEN, MD;  Location: [facility];  Service: Cardiovascular;  Laterality: N/A;   LEFT HEART CATH AND CORONARY ANGIOGRAPHY N/A 05/01/2022   Procedure: LEFT HEART CATH AND CORONARY ANGIOGRAPHY;  Surgeon: Verlin Lonni BIRCH, MD;  Location: [facility];  Service: Cardiovascular;  Laterality: N/A;   TEE WITHOUT CARDIOVERSION N/A 07/17/2022   Procedure: TRANSESOPHAGEAL ECHOCARDIOGRAM (TEE);  Surgeon: Cindie Ole DASEN, MD;  Location: [facility];  Service: Cardiovascular;  Laterality: N/A;   TONSILLECTOMY  AS CHILD   TOTAL HIP ARTHROPLASTY Left 05/11/2002   TOTAL HIP ARTHROPLASTY Right 11/15/2012   Procedure: RIGHT TOTAL HIP ARTHROPLASTY ANTERIOR APPROACH;  Surgeon: Oneil JAYSON Herald, MD;  Location: [facility];  Service: Orthopedics;  Laterality: Right;  Right total hip arthroplasty   TRANSTHORACIC ECHOCARDIOGRAM  05/08/2010   NORMAL LVSF/  EF 55%/  GRADE I DIASTOLIC DYSFUNCTION/  MILD BILATERAL ATRIUM ENLARGEMENT   Watchemn device  07/07/2022   Patient Active Problem List   Diagnosis Date Noted   Pain of left hand 01/20/2024   Arthritis of carpometacarpal (CMC) joint of left thumb 01/20/2024   Posterior tibial tendonitis 08/06/2022   Presence of Watchman left atrial appendage closure device 07/17/2022   Spinal stenosis of lumbar region 06/04/2022   Hematuria 05/16/2022   Coronary artery disease involving native coronary artery of native heart with unstable angina pectoris (HCC)    STEMI (ST elevation myocardial infarction) (HCC) 05/01/2022   Acute ST elevation myocardial infarction (STEMI) of inferior wall (HCC)    Recurrent kidney stones 08/14/2021   History of cerebrovascular accident (CVA) with residual deficit 02/11/2021   CVA (cerebral vascular accident) (HCC) 10/21/2020   Bilateral primary osteoarthritis of knee 02/15/2020   Acquired trigger finger of right little finger 11/03/2019   Encounter for orthopedic follow-up care 10/13/2019   H/O total hip arthroplasty, bilateral 10/24/2018   Gout 08/18/2017    Overweight (BMI 25.0-29.9) 01/23/2017   Foot pain, left 02/11/2016   Sebaceous cyst 07/14/2014   Sun-damaged skin 07/14/2014   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/14/2014   Physical exam, annual 05/17/2012   Inguinal hernia 08/12/2010   HYPERLIPIDEMIA TYPE I / IV 05/01/2010   Paroxysmal a-fib (HCC) 05/01/2010   ABNORMAL ELECTROCARDIOGRAM 03/26/2010    PCP: Mahlon Crank MD   REFERRING PROVIDER: Georgina Ozell LABOR, MD  REFERRING DIAG:  Diagnosis  R26.89 (ICD-10-CM) - Imbalance    THERAPY DIAG:  Repeated falls  Difficulty in walking, not elsewhere classified  Unsteadiness on feet  Muscle weakness (generalized)  Rationale for Evaluation and Treatment: Rehabilitation  ONSET DATE: early May 2025  SUBJECTIVE:   SUBJECTIVE STATEMENT: Back is hurting, making him feel unstable.    06/14/24 Tried to pick up an old sign that was hung up in tall grass, pulled to hard and had difficuty walking after wards. Difficulty standing up.     Eval:Had been to the beach in early may with tennis and dancing and what not, fell on May 9th and then again when using just a cane. This progressed to not being able to walk period, had a lot of back pain and went the ED, got pain medicine and tx  for bronchitis. Eventually ended up getting MRI which led to lumbar injections, this found that there was a disc and nerves being pinched or something. Injections helped a lot but now my main concern is imbalance that has been ongoing but slowly started getting better in past 2 weeks.   PERTINENT HISTORY:  See above  PAIN:  Are you having pain? 4/10 low back  Not sharp but makes it uncomfortable to walk  Laying down, ibuprofen  make it better Yard work/picking up something heavy  can make it worse   PRECAUTIONS: Fall  RED FLAGS: None   WEIGHT BEARING RESTRICTIONS: No  FALLS:  Has patient fallen in last 6 months? Yes. 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  DIAGNOSTIC FINDINGS:   CLINICAL DATA:  78 year old male with low back pain radiating down both legs since 02/04/2024. multiple falls.   EXAM: MRI LUMBAR SPINE WITHOUT CONTRAST   TECHNIQUE: Multiplanar, multisequence MR imaging of the lumbar spine was performed. No intravenous contrast was administered.   COMPARISON:  Lumbar spine CT 02/15/2024.  Lumbar MRI 05/13/2022.   FINDINGS: Segmentation: Normal on the comparison, the same numbering system used on the previous MRI.   Alignment: Stable since 2023. Subtle chronic grade 1 anterolisthesis of L3 on L4, mild at L5-S1. Underlying mild S-shaped lumbar scoliosis.   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 New degenerative anterior endplate marrow edema at T12-L1 (series 105, image 8).   Normal background bone marrow signal and maintained vertebral height. Intact visible sacrum and SI joints. No other acute osseous abnormality.   Conus medullaris and cauda equina: Conus extends to the L1 level. No lower spinal cord or conus signal abnormality.   Paraspinal and other soft tissues: Stable, negative Visualized abdominal viscera and paraspinal soft tissues.   Partially visible distended urinary bladder (series 105, image 4).   Disc levels:   Compared to the 2023 MRI:   T11-T12: Stable mild facet  hypertrophy on the right without stenosis.   T12-L1: Progressed anterior disc space loss and new vacuum disc (pronounced vacuum phenomena on the recent CT). Stable mild disc bulging without stenosis.   L1-L2: Interbody ankylosis now. Endplate spurring. Moderate chronic left L1 foraminal stenosis. No spinal stenosis.   L2-L3: Minor disc bulging. Stable mild facet and ligament flavum hypertrophy. No stenosis.   L3-L4: Progressed and now moderate to severe spinal and right lateral recess (right L4 nerve level) stenosis since 2023 in the setting of mild chronic anterolisthesis. Asymmetric disc/pseudo disc to the right with severe ligament flavum hypertrophy. Moderate to severe facet hypertrophy. Decreased facet joint fluid at this level. Moderate left L3 foraminal stenosis also appears mildly progressed.   L4-L5: Chronic disc space loss with increased circumferential disc bulge asymmetric to the right, moderate facet and ligament flavum hypertrophy. Increased mild spinal and moderate right lateral recess stenosis (right L5 nerve level series  106 image 33). Moderate right L4 neural foraminal stenosis also appears increased.   L5-S1: Stable mild disc/pseudo disc bulging. Moderate chronic facet degeneration and degenerative facet joint fluid. The left foraminal synovial cyst is less apparent on series 106, image 33. No convincing stenosis now.   IMPRESSION: 1. 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 - progressed and now moderate to severe multifactorial spinal and right lateral recess stenosis at L3-L4. Progressed moderate left foraminal stenosis. - progressed mild spinal and moderate  multifactorial right lateral recess and right foraminal stenosis at L4-L5.   2. Chronic L5-S1 facet arthropathy, but a left foraminal synovial cysts there seems regressed.   3. Distended urinary bladder.  Query urinary retention.   XRs of the lumbar spine from 03/02/2024 were independently reviewed and  interpreted, showing lumbar scoliotic curvature that measures 17 degrees  with apex to the right.  Disc height loss at L3/4 and L4/5.  No other  significant degenerative changes.  No evidence of instability on  flexion/extension views.  No fracture or dislocation seen.      PATIENT SURVEYS:  PSFS: THE PATIENT SPECIFIC FUNCTIONAL SCALE  Place score of 0-10 (0 = unable to perform activity and 10 = able to perform activity at the same level as before injury or problem)  Activity Date: 05/04/24 06/06/24 06/16/24  Walking with no device  4 6   2. Standing for a long time (>45 minutes) 2 6   3. Balance/steadiness in general  3 4   4.      Total Score 3 5     Total Score = Sum of activity scores/number of activities  Minimally Detectable Change: 3 points (for single activity); 2 points (for average score)  [facility] (nd). The Patient Specific Functional Scale . Retrieved from [email or web address]   COGNITION: Overall cognitive status: Within functional limits for tasks assessed        LOWER EXTREMITY MMT:  MMT Right eval Left eval Right 9/425 Left 06/02/24  Hip flexion 4 4 5 5   Hip extension      Hip abduction 4- seated  4- seated  4+ 4+  Hip adduction      Hip internal rotation      Hip external rotation      Knee flexion 4+ 4+ 4+ 4+  Knee extension 4+ 4+ 5 5  Ankle dorsiflexion 4+ 4+ 5   Ankle plantarflexion      Ankle inversion      Ankle eversion       (Blank rows = not tested)    FUNCTIONAL TESTS:  5 times sit to stand: 18.5 seconds no UEs; 05/25/24  13.8 seconds no UEs  Timed up and go (TUG):  21 seconds no device; 05/25/24 11.4 seconds   3 minute walk test: 447ft no device min guard   GAIT: Distance walked: 42ft Assistive device utilized: None Level of assistance: CGA Comments: more steady at first, got a lot more unsteady as fatigued increased, did need light MinA on turns towards end of test  TREATMENT DATE:  06/16/24 NuStep L5 x 6 min HS Curls 25lb 2x12 Leg Ext 10lb 2x12 Shoulder Ext 10lb 2x10  Seated HS stretch   06/14/24 Nustep L5x7 minutes seat 8, all four extremities  Seated Rows & Lats 25lb 2x10 Shoulder Ext 10lb 2x10 Sit to stand holding yellow ball 2x10 Seated OHP yellow ball 2x10 STM to lumbar spine w/ Tband  06/08/24  Nustep L5x8 minutes seat 8, all four extremities   Tandem stance solid surface 3x30 seconds B, attempted blue foam but unable Tandem walks // bars x4 laps solid surface  One foot on BOSU/other on ground 3x30 seconds B  Alternating toe taps to top of BOSU x20  Side steps blue foam pad x4 laps // bars     06/06/24 Bike L4 x 6 min  PSFS 5 DGI 21/30 Sit to stand OHP yellow ball 2x10  Shoulder Ext 5lb 2x10 Standing rows 10lb 2x10 6in step ups x10 each  06/02/24 NuStep L5 x 6 min Goals Gait around the widest part of back building in parking lot   Strenuous at the end going up hill Rows and lats 25lb 2x10 Shoulder Ext 5lb 2x10  05/31/24 NuStep L 5 x 6 min Shoulder Ext 5lb 2x10 Standing rows 10lb 2x10 Sit to stand OHP yellow ball 2x10  HS curls 25lb 2x10 Leg Ext 10lb 2x10 Bridges x 10 Passive stretching to bilateral HS, piriformis, ITB       PATIENT EDUCATION:  Education details: exam findings, POC Person educated: Patient Education method: Programmer, multimedia, Demonstration, and Handouts Education comprehension: verbalized understanding, returned demonstration, and needs further education  HOME  EXERCISE PROGRAM:  Access Code: [email or web address] URL: [email or web address] Date: 05/25/2024 Prepared by: Josette Rough  Exercises - Supine Lower Trunk Rotation  - 2 x daily - 7 x weekly - 1 sets - 10 reps - 5 seconds  hold - Supine Single Knee to Chest  - 2 x daily - 7 x weekly - 1 sets - 10 reps - 5 seconds  hold - Supine Bridge  - 2 x daily - 5 x weekly - 1 sets - 10 reps - Seated Quadratus Lumborum Stretch in Chair  - 2 x daily - 7 x weekly - 1 sets - 6 reps - 30 seconds  hold - Standing Tandem Balance with Counter Support  - 1 x daily - 7 x weekly - 1 sets - 6 reps - 30 seconds  hold   ASSESSMENT:  CLINICAL IMPRESSION:    EVAL: Patient is a 78 y.o. M who was seen today for physical therapy evaluation and treatment for R26.89 (ICD-10-CM) - Imbalance He enters ~ 7 minutes late for today session. He continues to report  increase low back pain form trying to pick up a old sign, with little improvement.  Postural cue needed with shoulder Ext.  Will make every effort to address functional concerns and reduce fall risk moving forward.   OBJECTIVE IMPAIRMENTS: Abnormal gait, decreased activity tolerance, decreased balance, decreased coordination, decreased knowledge of use of DME, decreased mobility, difficulty walking, decreased strength, and pain.   ACTIVITY LIMITATIONS: standing, squatting, stairs, transfers, and locomotion level  PARTICIPATION LIMITATIONS: driving, community activity, occupation, and yard work  PERSONAL FACTORS: Age, Behavior pattern, Fitness, Past/current experiences, Social background, and Time since onset of injury/illness/exacerbation are also affecting patient's functional outcome.   REHAB POTENTIAL: Good  CLINICAL DECISION MAKING: Stable/uncomplicated  EVALUATION COMPLEXITY: Low   GOALS: Goals reviewed with patient? No  SHORT TERM GOALS: Target date: 06/01/2024  Will be compliant with appropriate progressive HEP  Baseline: Goal status:  ONGOING 05/25/24 assigned initial program, 06/17/24 Met  2.  Will be able to name 3 ways to reduce fall risk at home and in community  Baseline:  Goal status: ONGOING 05/25/24, Met 06/02/24  3.  Will complete TUG in 15 seconds or less no device  Baseline:  Goal status: MET 05/25/24  4.  Will complete 5xSTS in 15 seconds no UEs  Baseline:  Goal status: MET 05/25/24    LONG TERM GOALS: Target date: 06/29/2024    MMT to have improved by at least 1 grade in all weak groups  Baseline:  Goal status: INITIAL  2.  Will score at least 18 on DGI  Baseline:  Goal status: 06/06/24 Met 21/30   3.  Will able to ambulate in community with no more than baseline LRAD as desired without difficulty  Baseline:  Goal status: Met 06/02/24 with back pain  4.  Will be able to perform standing tasks for longer than 1 hour Baseline:  Goal status: Progressing Guess I could but don't usually try 06/02/24, ongoing 06/16/24  5.  PSFS to have improved by at least 3 points  Baseline:  Goal status: Progressing 5     PLAN:  PT FREQUENCY: 2x/week  PT DURATION: 8 weeks  PLANNED INTERVENTIONS: 97750- Physical Performance Testing, 97110-Therapeutic exercises, 97530- Therapeutic activity, [id number]- Neuromuscular re-education, 97535- Self Care, [id number]- Manual therapy, [phone]- Gait training, and DME instructions  PLAN FOR NEXT SESSION: balance, functional activity tolerance, strength  Josette Rough, PT, DPT 06/16/24 11:53 AM

## 2024-06-21 ENCOUNTER — Encounter: Payer: Self-pay | Admitting: Physical Therapy

## 2024-06-21 ENCOUNTER — Ambulatory Visit: Admitting: Physical Therapy

## 2024-06-21 DIAGNOSIS — R262 Difficulty in walking, not elsewhere classified: Secondary | ICD-10-CM

## 2024-06-21 DIAGNOSIS — M6281 Muscle weakness (generalized): Secondary | ICD-10-CM

## 2024-06-21 DIAGNOSIS — R2681 Unsteadiness on feet: Secondary | ICD-10-CM | POA: Diagnosis not present

## 2024-06-21 DIAGNOSIS — R296 Repeated falls: Secondary | ICD-10-CM | POA: Diagnosis not present

## 2024-06-21 NOTE — Therapy (Signed)
 OUTPATIENT PHYSICAL THERAPY LOWER EXTREMITY TREATMENT      Patient Name: [name] Short MRN: [id number] DOB:[birth year], 78 y.o., male Today's Date: 06/21/2024  END OF SESSION:  PT End of Session - 06/21/24 1313     Visit Number 14    Date for Recertification  06/29/24    PT Start Time 1313    PT Stop Time 1345    PT Time Calculation (min) 32 min    Activity Tolerance Patient tolerated treatment well    Behavior During Therapy WFL for tasks assessed/performed                Past Medical History:  Diagnosis Date   Arthritis    WRIST   Benign localized prostatic hyperplasia with lower urinary tract symptoms (LUTS)    Complication of anesthesia    slow to wake   ED (erectile dysfunction)    GERD (gastroesophageal reflux disease)    History of concussion    AS CHILD--  NO RESIDUAL   History of gout    History of kidney stones    Hyperlipidemia    Inguinal hernia, bilateral    Nephrolithiasis    BILATERAL   PAF (paroxysmal atrial fibrillation) (HCC) currently being followed by pcp   EPISODE --  2011  FOLLOW-UP W/ DR WALL  /  HAS NOT SEEN ANY CARDIOLOGIST SINCE   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 Cindie   Past Surgical History:  Procedure Laterality Date   APPENDECTOMY  1983   CARDIOVASCULAR STRESS TEST  05-08-2010  DR WALL   NO EVIDENCE OF SCAR OR ISCHEMIA/ EF 54%/  PT HAD BOTH AFIB/ AFLUTTER DURING STUDY   CARPAL TUNNEL RELEASE Right 01/ 14/ 2021   CORONARY STENT INTERVENTION N/A 05/02/2022   Procedure: CORONARY STENT INTERVENTION;  Surgeon: Verlin Lonni BIRCH, MD;  Location: [facility];  Service: Cardiovascular;  Laterality: N/A;   CORONARY/GRAFT ACUTE MI REVASCULARIZATION N/A 05/01/2022   Procedure: Coronary/Graft Acute MI Revascularization;  Surgeon: Verlin Lonni BIRCH, MD;  Location: [facility];  Service: Cardiovascular;  Laterality: N/A;   CYSTOSCOPY W/  URETERAL STENT PLACEMENT Left 09/20/2013   Procedure: CYSTOSCOPY WITH STENT REPLACEMENT;  Surgeon: Donnice Gwenyth Brooks, MD;  Location: [facility];  Service: Urology;  Laterality: Left;   CYSTOSCOPY WITH URETEROSCOPY Left 09/20/2013   Procedure: CYSTOSCOPY WITH URETEROSCOPY;  Surgeon: Donnice Gwenyth Brooks, MD;  Location: [facility];  Service: Urology;  Laterality: Left;   CYSTOSCOPY WITH URETEROSCOPY AND STENT PLACEMENT Left 07/12/2013   Procedure: CYSTOSCOPY WITH LITHOLAPEXY, LEFT URETERAL STENT PLACEMENT;  Surgeon: Donnice Gwenyth Brooks, MD;  Location: [facility];  Service: Urology;  Laterality: Left;   CYSTOSCOPY WITH URETEROSCOPY AND STENT PLACEMENT Bilateral 06/23/2014   Procedure: BILATERAL URETEROSCOPY, HOLMIUM LASER LITHOTRIPSY AND STENT PLACEMENT, RIGHT ;  Surgeon: Donnice Brooks, MD;  Location: [facility];  Service: Urology;  Laterality: Bilateral;   EXTRACORPOREAL SHOCK WAVE LITHOTRIPSY  X2   EXTRACORPOREAL SHOCK WAVE LITHOTRIPSY Left 08-29-2013;   07-28-2013   EXTRACORPOREAL SHOCK WAVE LITHOTRIPSY Right 12/09/2018   Procedure: EXTRACORPOREAL SHOCK WAVE LITHOTRIPSY (ESWL);  Surgeon: Brooks Donnice, MD;  Location: [facility];  Service: Urology;  Laterality: Right;   HOLMIUM LASER APPLICATION Left 09/20/2013   Procedure: HOLMIUM LASER APPLICATION;  Surgeon: Donnice Gwenyth Brooks, MD;  Location: [facility];  Service: Urology;  Laterality: Left;   INGUINAL HERNIA REPAIR Bilateral 10/16/2020  Procedure: BILATERAL OPEN INGUINAL HERNIA REPAIR WITH MESH;  Surgeon: Vernetta Berg, MD;  Location: [facility];  Service: General;  Laterality: Bilateral;  LMA/TAP BLOCK   LAPAROSCOPIC INGUINAL HERNIA REPAIR Bilateral 09/04/2010   w/ mesh   LEFT ATRIAL APPENDAGE OCCLUSION N/A 07/17/2022   Procedure: LEFT ATRIAL APPENDAGE OCCLUSION;  Surgeon: Cindie Ole DASEN, MD;  Location: [facility];  Service: Cardiovascular;  Laterality: N/A;   LEFT HEART CATH AND CORONARY ANGIOGRAPHY N/A 05/01/2022   Procedure: LEFT HEART CATH AND CORONARY ANGIOGRAPHY;  Surgeon: Verlin Lonni BIRCH, MD;  Location: [facility];  Service: Cardiovascular;  Laterality: N/A;   TEE WITHOUT CARDIOVERSION N/A 07/17/2022   Procedure: TRANSESOPHAGEAL ECHOCARDIOGRAM (TEE);  Surgeon: Cindie Ole DASEN, MD;  Location: [facility];  Service: Cardiovascular;  Laterality: N/A;   TONSILLECTOMY  AS CHILD   TOTAL HIP ARTHROPLASTY Left 05/11/2002   TOTAL HIP ARTHROPLASTY Right 11/15/2012   Procedure: RIGHT TOTAL HIP ARTHROPLASTY ANTERIOR APPROACH;  Surgeon: Oneil JAYSON Herald, MD;  Location: [facility];  Service: Orthopedics;  Laterality: Right;  Right total hip arthroplasty   TRANSTHORACIC ECHOCARDIOGRAM  05/08/2010   NORMAL LVSF/  EF 55%/  GRADE I DIASTOLIC DYSFUNCTION/  MILD BILATERAL ATRIUM ENLARGEMENT   Watchemn device  07/07/2022   Patient Active Problem List   Diagnosis Date Noted   Pain of left hand 01/20/2024   Arthritis of carpometacarpal [facility]) joint of left thumb 01/20/2024   Posterior tibial tendonitis 08/06/2022   Presence of Watchman left atrial appendage closure device 07/17/2022   Spinal stenosis of lumbar region 06/04/2022   Hematuria 05/16/2022   Coronary artery disease involving native coronary artery of native heart with unstable angina pectoris (HCC)    STEMI (ST elevation myocardial infarction) (HCC) 05/01/2022   Acute ST elevation myocardial infarction (STEMI) of inferior wall (HCC)    Recurrent kidney stones 08/14/2021   History of cerebrovascular accident (CVA) with residual deficit 02/11/2021   CVA (cerebral vascular accident) (HCC) 10/21/2020   Bilateral primary osteoarthritis of knee 02/15/2020   Acquired trigger finger of right little finger 11/03/2019   Encounter for orthopedic follow-up care 10/13/2019   H/O total hip arthroplasty, bilateral 10/24/2018   Gout 08/18/2017    Overweight (BMI 25.0-29.9) 01/23/2017   Foot pain, left 02/11/2016   Sebaceous cyst 07/14/2014   Sun-damaged skin 07/14/2014   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/14/2014   Physical exam, annual 05/17/2012   Inguinal hernia 08/12/2010   HYPERLIPIDEMIA TYPE I / IV 05/01/2010   Paroxysmal a-fib (HCC) 05/01/2010   ABNORMAL ELECTROCARDIOGRAM 03/26/2010    PCP: Mahlon Crank MD   REFERRING PROVIDER: Georgina Ozell LABOR, MD  REFERRING DIAG:  Diagnosis  R26.89 (ICD-10-CM) - Imbalance    THERAPY DIAG:  Repeated falls  Difficulty in walking, not elsewhere classified  Unsteadiness on feet  Muscle weakness (generalized)  Rationale for Evaluation and Treatment: Rehabilitation  ONSET DATE: early May 2025  SUBJECTIVE:   SUBJECTIVE STATEMENT: Ive been going down hill sing the incident. Back has been hurting, don't feel luke hs can control his legs.    06/14/24 Tried to pick up an old sign that was hung up in tall grass, pulled to hard and had difficuty walking after wards. Difficulty standing up.     Eval:Had been to the beach in early may with tennis and dancing and what not, fell on May 9th and then again when using just a cane. This progressed to not being able to walk period, had a  lot of back pain and went the ED, got pain medicine and tx for bronchitis. Eventually ended up getting MRI which led to lumbar injections, this found that there was a disc and nerves being pinched or something. Injections helped a lot but now my main concern is imbalance that has been ongoing but slowly started getting better in past 2 weeks.   PERTINENT HISTORY:  See above  PAIN:  Are you having pain? 4/10 low back  Not sharp but makes it uncomfortable to walk  Laying down, ibuprofen  make it better Yard work/picking up something heavy  can make it worse   PRECAUTIONS: Fall  RED FLAGS: None   WEIGHT BEARING RESTRICTIONS: No  FALLS:  Has patient fallen in last 6 months? Yes. 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  DIAGNOSTIC FINDINGS:   CLINICAL DATA:  78 year old male with low back pain radiating down both legs since 02/04/2024. multiple falls.   EXAM: MRI LUMBAR SPINE WITHOUT CONTRAST   TECHNIQUE: Multiplanar, multisequence MR imaging of the lumbar spine was performed. No intravenous contrast was administered.   COMPARISON:  Lumbar spine CT 02/15/2024.  Lumbar MRI 05/13/2022.   FINDINGS: Segmentation: Normal on the comparison, the same numbering system used on the previous MRI.   Alignment: Stable since 2023. Subtle chronic grade 1 anterolisthesis of L3 on L4, mild at L5-S1. Underlying mild S-shaped lumbar scoliosis.   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 New degenerative anterior endplate marrow edema at T12-L1 (series 105, image 8).   Normal background bone marrow signal and maintained vertebral height. Intact visible sacrum and SI joints. No other acute osseous abnormality.   Conus medullaris and cauda equina: Conus extends to the L1 level. No lower spinal cord or conus signal abnormality.   Paraspinal and other soft tissues: Stable, negative Visualized abdominal viscera and paraspinal soft tissues.   Partially visible distended urinary bladder (series 105, image 4).   Disc levels:    Compared to the 2023 MRI:   T11-T12: Stable mild facet hypertrophy on the right without stenosis.   T12-L1: Progressed anterior disc space loss and new vacuum disc (pronounced vacuum phenomena on the recent CT). Stable mild disc bulging without stenosis.   L1-L2: Interbody ankylosis now. Endplate spurring. Moderate chronic left L1 foraminal stenosis. No spinal stenosis.   L2-L3: Minor disc bulging. Stable mild facet and ligament flavum hypertrophy. No stenosis.   L3-L4: Progressed and now moderate to severe spinal and right lateral recess (right L4 nerve level) stenosis since 2023 in the setting of mild chronic anterolisthesis. Asymmetric disc/pseudo disc to the right with severe ligament flavum hypertrophy. Moderate to severe facet hypertrophy. Decreased facet joint fluid at this level. Moderate left L3 foraminal stenosis also appears mildly progressed.   L4-L5: Chronic disc space loss with increased circumferential disc bulge asymmetric to the right, moderate facet and ligament flavum hypertrophy. Increased  mild spinal and moderate right lateral recess stenosis (right L5 nerve level series 106 image 33). Moderate right L4 neural foraminal stenosis also appears increased.   L5-S1: Stable mild disc/pseudo disc bulging. Moderate chronic facet degeneration and degenerative facet joint fluid. The left foraminal synovial cyst is less apparent on series 106, image 33. No convincing stenosis now.   IMPRESSION: 1. 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 - progressed and now moderate to severe multifactorial spinal and right lateral recess stenosis at L3-L4. Progressed moderate  left foraminal stenosis. - progressed mild spinal and moderate multifactorial right lateral recess and right foraminal stenosis at L4-L5.   2. Chronic L5-S1 facet arthropathy, but a left foraminal synovial cysts there seems regressed.   3. Distended urinary bladder.  Query urinary retention.   XRs of the lumbar spine from 03/02/2024 were independently reviewed and  interpreted, showing lumbar scoliotic curvature that measures 17 degrees  with apex to the right.  Disc height loss at L3/4 and L4/5.  No other  significant degenerative changes.  No evidence of instability on  flexion/extension views.  No fracture or dislocation seen.      PATIENT SURVEYS:  PSFS: THE PATIENT SPECIFIC FUNCTIONAL SCALE  Place score of 0-10 (0 = unable to perform activity and 10 = able to perform activity at the same level as before injury or problem)  Activity Date: 05/04/24 06/06/24 06/16/24  Walking with no device  4 6   2. Standing for a long time (>45 minutes) 2 6   3. Balance/steadiness in general  3 4   4.      Total Score 3 5     Total Score = Sum of activity scores/number of activities  Minimally Detectable Change: 3 points (for single activity); 2 points (for average score)  [facility] (nd). The Patient Specific Functional Scale . Retrieved from [email or web address]   COGNITION: Overall cognitive status: Within functional limits for tasks assessed        LOWER EXTREMITY MMT:  MMT Right eval Left eval Right 9/425 Left 06/02/24  Hip flexion 4 4 5 5   Hip extension      Hip abduction 4- seated  4- seated  4+ 4+  Hip adduction      Hip internal rotation      Hip external rotation      Knee flexion 4+ 4+ 4+ 4+  Knee extension 4+ 4+ 5 5  Ankle dorsiflexion 4+ 4+ 5   Ankle plantarflexion      Ankle inversion      Ankle eversion       (Blank rows = not tested)    FUNCTIONAL TESTS:  5 times sit to stand: 18.5  seconds no UEs; 05/25/24  13.8 seconds no UEs  Timed up and go (TUG): 21 seconds no device; 05/25/24 11.4 seconds   3 minute walk test: 464ft no device min guard   GAIT: Distance walked: 452ft Assistive device utilized: None Level of assistance: CGA Comments: more steady at first, got a lot more unsteady as fatigued increased, did need light MinA on turns towards end of test  TREATMENT DATE:  06/21/24 NuStep L 5 x 6 min MHP to lumbar spine  Passive stretching to HS, piriformis, K2C  Lower trunk rotations Bridges  06/16/24 NuStep L5 x 6 min HS Curls 25lb 2x12 Leg Ext 10lb 2x12 Shoulder Ext 10lb 2x10  Seated HS stretch   06/14/24 Nustep L5x7 minutes seat 8, all four extremities  Seated Rows & Lats 25lb 2x10 Shoulder Ext 10lb 2x10 Sit to stand holding yellow ball 2x10 Seated OHP yellow ball 2x10 STM to lumbar spine w/ Tband  06/08/24  Nustep L5x8 minutes seat 8, all four extremities   Tandem stance solid surface 3x30 seconds B, attempted blue foam but unable Tandem walks // bars x4 laps solid surface  One foot on BOSU/other on ground 3x30 seconds B  Alternating toe taps to top of BOSU x20  Side steps blue foam pad x4 laps // bars     06/06/24 Bike L4 x 6 min  PSFS 5 DGI 21/30 Sit to stand OHP yellow ball 2x10  Shoulder Ext 5lb 2x10 Standing rows 10lb 2x10 6in step ups x10 each  06/02/24 NuStep L5 x 6 min Goals Gait around the widest part of back building in parking lot   Strenuous at the end going up hill Rows and lats 25lb 2x10 Shoulder Ext 5lb 2x10  05/31/24 NuStep L 5 x 6 min Shoulder Ext 5lb 2x10 Standing rows 10lb 2x10 Sit to stand OHP yellow ball 2x10  HS curls 25lb 2x10 Leg Ext 10lb 2x10 Bridges x 10 Passive stretching to bilateral HS, piriformis, ITB       PATIENT EDUCATION:  Education details: exam findings,  POC Person educated: Patient Education method: Programmer, multimedia, Demonstration, and Handouts Education comprehension: verbalized understanding, returned demonstration, and needs further education  HOME EXERCISE PROGRAM:  Access Code: [email or web address] URL: [email or web address] Date: 05/25/2024 Prepared by: Josette Rough  Exercises - Supine Lower Trunk Rotation  - 2 x daily - 7 x weekly - 1 sets - 10 reps - 5 seconds  hold - Supine Single Knee to Chest  - 2 x daily - 7 x weekly - 1 sets - 10 reps - 5 seconds  hold - Supine Bridge  - 2 x daily - 5 x weekly - 1 sets - 10 reps - Seated Quadratus Lumborum Stretch in Chair  - 2 x daily - 7 x weekly - 1 sets - 6 reps - 30 seconds  hold - Standing Tandem Balance with Counter Support  - 1 x daily - 7 x weekly - 1 sets - 6 reps - 30 seconds  hold   ASSESSMENT:  CLINICAL IMPRESSION:   Pt enters ambulating with RW ~ 13 minutes late. He reports a decline in function. Pt not sure if it is due to the injection wearing or the incident form him picking up a sign. Session focused on passive stretching due to reports of pain  and decrease mobility. Some pain from laying supine. Bilateral tightness n both LE. Cue for equal pressure with bridges. Advised pt to contact MD ASAP.   EVAL: Patient is a 78 y.o. M who was seen today for physical therapy evaluation and treatment for R26.89 (ICD-10-CM) - Imbalance He enters ~ 7 minutes late for today session. He continues to report  increase low back pain form trying to pick up a old sign, with little improvement.  Postural cue needed with shoulder Ext.  Will make every effort to address functional concerns and reduce fall risk moving forward.   OBJECTIVE IMPAIRMENTS: Abnormal  gait, decreased activity tolerance, decreased balance, decreased coordination, decreased knowledge of use of DME, decreased mobility, difficulty walking, decreased strength, and pain.   ACTIVITY LIMITATIONS: standing, squatting, stairs,  transfers, and locomotion level  PARTICIPATION LIMITATIONS: driving, community activity, occupation, and yard work  PERSONAL FACTORS: Age, Behavior pattern, Fitness, Past/current experiences, Social background, and Time since onset of injury/illness/exacerbation are also affecting patient's functional outcome.   REHAB POTENTIAL: Good  CLINICAL DECISION MAKING: Stable/uncomplicated  EVALUATION COMPLEXITY: Low   GOALS: Goals reviewed with patient? No  SHORT TERM GOALS: Target date: 06/01/2024    Will be compliant with appropriate progressive HEP  Baseline: Goal status: ONGOING 05/25/24 assigned initial program, 06/17/24 Met  2.  Will be able to name 3 ways to reduce fall risk at home and in community  Baseline:  Goal status: ONGOING 05/25/24, Met 06/02/24  3.  Will complete TUG in 15 seconds or less no device  Baseline:  Goal status: MET 05/25/24  4.  Will complete 5xSTS in 15 seconds no UEs  Baseline:  Goal status: MET 05/25/24    LONG TERM GOALS: Target date: 06/29/2024    MMT to have improved by at least 1 grade in all weak groups  Baseline:  Goal status: INITIAL  2.  Will score at least 18 on DGI  Baseline:  Goal status: 06/06/24 Met 21/30   3.  Will able to ambulate in community with no more than baseline LRAD as desired without difficulty  Baseline:  Goal status: Met 06/02/24 with back pain  4.  Will be able to perform standing tasks for longer than 1 hour Baseline:  Goal status: Progressing Guess I could but don't usually try 06/02/24, ongoing 06/16/24  5.  PSFS to have improved by at least 3 points  Baseline:  Goal status: Progressing 5     PLAN:  PT FREQUENCY: 2x/week  PT DURATION: 8 weeks  PLANNED INTERVENTIONS: 97750- Physical Performance Testing, 97110-Therapeutic exercises, 97530- Therapeutic activity, [id number]- Neuromuscular re-education, 97535- Self Care, 02859- Manual therapy, 97116- Gait training, and DME instructions  PLAN FOR NEXT SESSION:  balance, functional activity tolerance, strength  Tanda Sorrow, PTA 06/21/24 1:13 PM

## 2024-06-22 ENCOUNTER — Telehealth: Payer: Self-pay

## 2024-06-22 NOTE — Telephone Encounter (Signed)
 Patients wife Stephane called stating that patient is having a set back and wanted to know if doing PT could be causing this set back.  Stated that patient is back using his walker and having a lot of pain.  Would like a CB to discuss.  Cb# [phone].  Please advise.  Thank you.

## 2024-06-23 ENCOUNTER — Ambulatory Visit: Admitting: Physical Therapy

## 2024-06-23 NOTE — Telephone Encounter (Signed)
 Talked with Dawn and advised her of message below, per Dr. Georgina.  Voiced that she understands.

## 2024-06-24 ENCOUNTER — Telehealth: Payer: Self-pay | Admitting: Orthopedic Surgery

## 2024-06-24 MED ORDER — TRAMADOL HCL 50 MG PO TABS: 50.0000 mg | ORAL_TABLET | Freq: Four times a day (QID) | ORAL | 0 refills | Status: DC | PRN

## 2024-06-24 NOTE — Addendum Note (Signed)
 Addended by: GEORGINA SHARPER on: 06/24/2024 02:50 PM   Modules accepted: Orders

## 2024-06-24 NOTE — Telephone Encounter (Signed)
 Patient called and said that he needs a refill of the Tramadol . CB#[phone]

## 2024-06-29 ENCOUNTER — Other Ambulatory Visit: Payer: Self-pay | Admitting: Orthopedic Surgery

## 2024-06-29 ENCOUNTER — Encounter: Payer: Self-pay | Admitting: Physical Medicine and Rehabilitation

## 2024-06-29 MED ORDER — TRAMADOL HCL 50 MG PO TABS: 50.0000 mg | ORAL_TABLET | Freq: Four times a day (QID) | ORAL | 0 refills | Status: DC | PRN

## 2024-06-29 NOTE — Addendum Note (Signed)
 Addended by: CHAROL SIC, BARI SAILOR on: 06/29/2024 05:13 PM   Modules accepted: Orders

## 2024-06-29 NOTE — Telephone Encounter (Signed)
 Patient called and needs a refill on Tramadol . CB#[phone]

## 2024-06-30 ENCOUNTER — Encounter: Admitting: Physical Medicine and Rehabilitation

## 2024-06-30 ENCOUNTER — Telehealth: Payer: Self-pay

## 2024-06-30 NOTE — Telephone Encounter (Signed)
 Pt's wife called stating that an ambulance is the only way the pt is going to be able to come to his appointment tomorrow with Dr. Eldonna. Pt wanted to make sure this is okay and if Dr. Eldonna would be able to write a letter stating medical necessity of the ambulance. Call back- [phone]

## 2024-07-01 ENCOUNTER — Other Ambulatory Visit: Payer: Self-pay

## 2024-07-01 ENCOUNTER — Other Ambulatory Visit: Payer: Self-pay | Admitting: Orthopedic Surgery

## 2024-07-01 ENCOUNTER — Ambulatory Visit: Admitting: Physical Medicine and Rehabilitation

## 2024-07-01 VITALS — BP 142/84 | HR 85

## 2024-07-01 DIAGNOSIS — M5416 Radiculopathy, lumbar region: Secondary | ICD-10-CM

## 2024-07-01 MED ORDER — TRAMADOL HCL 50 MG PO TABS: 50.0000 mg | ORAL_TABLET | Freq: Four times a day (QID) | ORAL | 0 refills | Status: DC | PRN

## 2024-07-01 MED ORDER — METHYLPREDNISOLONE ACETATE 80 MG/ML IJ SUSP
40.0000 mg | Freq: Once | INTRAMUSCULAR | Status: AC
  Administered 2024-07-01: 40 mg

## 2024-07-01 NOTE — Telephone Encounter (Signed)
 Pt called stating that they need a refill of Tramadol . Pt pharmacy is [facility] [address] Number is [phone]. Pt would like a call back. Pt number is [phone].

## 2024-07-01 NOTE — Progress Notes (Signed)
 Pain Scale   Average Pain 10 Patient advising he has lower back pain radiating to both legs more so on the left.         +Driver, -BT, -Dye Allergies.

## 2024-07-06 ENCOUNTER — Telehealth: Payer: Self-pay | Admitting: Orthopedic Surgery

## 2024-07-06 MED ORDER — TRAMADOL HCL 50 MG PO TABS: 50.0000 mg | ORAL_TABLET | Freq: Four times a day (QID) | ORAL | 0 refills | Status: DC | PRN

## 2024-07-06 NOTE — Addendum Note (Signed)
 Addended by: GEORGINA SHARPER on: 07/06/2024 04:39 PM   Modules accepted: Orders

## 2024-07-06 NOTE — Telephone Encounter (Signed)
 Patient called and needs a refill on Tramadol . CB#[phone]

## 2024-07-10 NOTE — Procedures (Signed)
 Lumbar Epidural Steroid Injection - Interlaminar Approach with Fluoroscopic Guidance  Patient: [name] Short      Date of Birth: [birth year] MRN: [id number] PCP: Mahlon Comer BRAVO, MD      Visit Date: 07/01/2024   Universal Protocol:     Consent Given By: the patient  Position: PRONE  Additional Comments: Vital signs were monitored before and after the procedure. Patient was prepped and draped in the usual sterile fashion. The correct patient, procedure, and site was verified.   Injection Procedure Details:   Procedure diagnoses: Lumbar radiculopathy [M54.16]   Meds Administered:  Meds ordered this encounter  Medications   methylPREDNISolone  acetate (DEPO-MEDROL ) injection 40 mg     Laterality: Left  Location/Site:  L3-4  Needle: 3.5 in., 20 ga. Tuohy  Needle Placement: Paramedian epidural  Findings:   -Comments: Excellent flow of contrast into the epidural space. 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 Recommend transforaminal approach.  Procedure Details: Using a paramedian approach from the side mentioned above, the region overlying the inferior lamina was localized under fluoroscopic visualization and the soft tissues overlying this structure were infiltrated with 4 ml. of 1% Lidocaine  without Epinephrine . The Tuohy needle was inserted into the epidural space using a paramedian approach.   The epidural space was localized using loss of resistance along with counter oblique bi-planar fluoroscopic views.  After negative aspirate for air, blood, and CSF, a 2 ml. volume of Isovue -250 was injected into the epidural space and the flow of contrast was observed. Radiographs were obtained for documentation purposes.    The injectate was administered into the level noted above.   Additional Comments:  The patient tolerated the procedure well Dressing: 2 x 2 sterile gauze and Band-Aid    Post-procedure details: Patient was observed during the  procedure. Post-procedure instructions were reviewed.  Patient left the clinic in stable condition.

## 2024-07-10 NOTE — Progress Notes (Signed)
 [name] Short Lied - 78 y.o. male MRN [id number]  Date of birth: 06-Mar-1946  Office Visit Note: Visit Date: 07/01/2024 PCP: Mahlon Comer BRAVO, MD Referred by: Georgina Ozell LABOR, MD  Subjective: Chief Complaint  Patient presents with   Lower Back - Pain   HPI:  [name] Short is a 78 y.o. male who comes in today for planned repeat Left L3-4  Lumbar Interlaminar epidural steroid injection with fluoroscopic guidance.  The patient has failed conservative care including home exercise, medications, time and activity modification.  This injection will be diagnostic and hopefully therapeutic.  Please see requesting physician notes for further details and justification. Patient received more than 50% pain relief from prior injection.   Prior injection by [facility]. I reviewed the injection fluoroscopy and it appears it was a difficult entry due to bony stenosis.   **Depending on results today would recommend L3 transforaminal approach or L4-5 interlaminar.    Referring: Dr. Ozell Georgina   ROS Otherwise per HPI.  Assessment & Plan: Visit Diagnoses:    ICD-10-CM   1. Lumbar radiculopathy  M54.16 XR C-ARM NO REPORT    Epidural Steroid injection    methylPREDNISolone  acetate (DEPO-MEDROL ) injection 40 mg      Plan: No additional findings.   Meds & Orders:  Meds ordered this encounter  Medications   methylPREDNISolone  acetate (DEPO-MEDROL ) injection 40 mg    Orders Placed This Encounter  Procedures   XR C-ARM NO REPORT   Epidural Steroid injection    Follow-up: Return for visit to requesting provider as needed.   Procedures: No procedures performed  Lumbar Epidural Steroid Injection - Interlaminar Approach with Fluoroscopic Guidance  Patient: [name] Short      Date of Birth: 07/07/1946 MRN: [id number] PCP: Mahlon Comer BRAVO, MD      Visit Date: 07/01/2024   Universal Protocol:     Consent Given By: the patient  Position: PRONE  Additional  Comments: Vital signs were monitored before and after the procedure. Patient was prepped and draped in the usual sterile fashion. The correct patient, procedure, and site was verified.   Injection Procedure Details:   Procedure diagnoses: Lumbar radiculopathy [M54.16]   Meds Administered:  Meds ordered this encounter  Medications   methylPREDNISolone  acetate (DEPO-MEDROL ) injection 40 mg     Laterality: Left  Location/Site:  L3-4  Needle: 3.5 in., 20 ga. Tuohy  Needle Placement: Paramedian epidural  Findings:   -Comments: Excellent flow of contrast into the epidural space. 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 Recommend transforaminal approach.  Procedure Details: Using a paramedian approach from the side mentioned above, the region overlying the inferior lamina was localized under fluoroscopic visualization and the soft tissues overlying this structure were infiltrated with 4 ml. of 1% Lidocaine  without Epinephrine . The Tuohy needle was inserted into the epidural space using a paramedian approach.   The epidural space was localized using loss of resistance along with counter oblique bi-planar fluoroscopic views.  After negative aspirate for air, blood, and CSF, a 2 ml. volume of Isovue -250 was injected into the epidural space and the flow of contrast was observed. Radiographs were obtained for documentation purposes.    The injectate was administered into the level noted above.   Additional Comments:  The patient tolerated the procedure well Dressing: 2 x 2 sterile gauze and Band-Aid    Post-procedure details: Patient was observed during the procedure. Post-procedure instructions were reviewed.  Patient left the clinic in  stable condition.   Clinical History: CLINICAL DATA:  78 year old male with low back pain radiating down both legs since 02/04/2024. multiple falls.   EXAM: MRI LUMBAR SPINE WITHOUT CONTRAST   TECHNIQUE: Multiplanar,  multisequence MR imaging of the lumbar spine was performed. No intravenous contrast was administered.   COMPARISON:  Lumbar spine CT 02/15/2024.  Lumbar MRI 05/13/2022.   FINDINGS: Segmentation: Normal on the comparison, the same numbering system used on the previous MRI.   Alignment: Stable since 2023. Subtle chronic grade 1 anterolisthesis of L3 on L4, mild at L5-S1. Underlying mild S-shaped lumbar scoliosis.   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 New degenerative anterior endplate marrow edema at T12-L1 (series 105, image 8).   Normal background bone marrow signal and maintained vertebral height. Intact visible sacrum and SI joints. No other acute osseous abnormality.   Conus medullaris and cauda equina: Conus extends to the L1 level. No lower spinal cord or conus signal abnormality.   Paraspinal and other soft tissues: Stable, negative Visualized abdominal viscera and paraspinal soft tissues.   Partially visible distended urinary bladder (series 105, image 4).   Disc levels:   Compared to the 2023 MRI:   T11-T12: Stable mild facet hypertrophy on the right without stenosis.   T12-L1: Progressed anterior disc space loss and new vacuum disc (pronounced vacuum phenomena on the recent CT). Stable mild disc bulging without stenosis.   L1-L2: Interbody ankylosis now. Endplate spurring. Moderate chronic left L1 foraminal stenosis. No spinal stenosis.   L2-L3: Minor disc bulging. Stable mild facet and ligament flavum hypertrophy. No stenosis.   L3-L4: Progressed and now moderate to severe spinal and right lateral recess (right L4 nerve level) stenosis since 2023 in the setting of mild chronic anterolisthesis. Asymmetric disc/pseudo disc to the right with severe ligament flavum hypertrophy. Moderate to severe facet hypertrophy. Decreased facet joint fluid at this level. Moderate left L3 foraminal stenosis also appears  mildly progressed.   L4-L5: Chronic disc space loss with increased circumferential disc bulge asymmetric to the right, moderate facet and ligament flavum hypertrophy. Increased mild spinal and moderate right lateral recess stenosis (right L5 nerve level series 106 image 33). Moderate right L4 neural foraminal stenosis also appears increased.   L5-S1: Stable mild disc/pseudo disc bulging. Moderate chronic facet degeneration and degenerative facet joint fluid. The left foraminal synovial cyst is less apparent on series 106, image 33. No convincing stenosis now.   IMPRESSION: 1. 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 - progressed and now moderate to severe multifactorial spinal and right lateral recess stenosis at L3-L4. Progressed moderate left foraminal stenosis. - progressed mild spinal and moderate multifactorial right lateral recess and right foraminal stenosis at L4-L5.   2. Chronic L5-S1 facet arthropathy, but a left foraminal synovial cysts there seems regressed.   3. Distended urinary bladder.  Query urinary retention.     Electronically Signed   By: VEAR Hurst M.D.   On: 02/25/2024 08:24     Objective:  VS:  HT:    WT:   BMI:     BP:(!) 142/84  HR:85bpm  TEMP: ( )  RESP:  Physical Exam Vitals and nursing note reviewed.  Constitutional:      General: He  is not in acute distress.    Appearance: Normal appearance. He is well-developed. He is not ill-appearing.  HENT:     Head: Normocephalic and atraumatic.     Right Ear: External ear normal.     Left Ear: External ear normal.     Nose: No congestion.  Eyes:     Extraocular Movements: Extraocular movements  intact.     Conjunctiva/sclera: Conjunctivae normal.     Pupils: Pupils are equal, round, and reactive to light.  Cardiovascular:     Rate and Rhythm: Normal rate.     Pulses: Normal pulses.     Heart sounds: Normal heart sounds.  Pulmonary:     Effort: Pulmonary effort is normal. No respiratory distress.  Abdominal:     General: There is no distension.     Palpations: Abdomen is soft.  Musculoskeletal:        General: No tenderness or signs of injury.     Cervical back: Normal range of motion and neck supple. No rigidity.     Right lower leg: No edema.     Left lower leg: No edema.     Comments: Patient has good distal strength without clonus.  Skin:    General: Skin is warm and dry.     Findings: No erythema or rash.  Neurological:     General: No focal deficit present.     Mental Status: He is alert and oriented to person, place, and time.     Sensory: No sensory deficit.     Motor: No weakness or abnormal muscle tone.     Coordination: Coordination normal.     Gait: Gait normal.  Psychiatric:        Mood and Affect: Mood normal.        Behavior: Behavior normal.      Imaging: No results found.

## 2024-07-12 ENCOUNTER — Telehealth: Payer: Self-pay | Admitting: Orthopedic Surgery

## 2024-07-12 ENCOUNTER — Other Ambulatory Visit: Payer: Self-pay | Admitting: Orthopedic Surgery

## 2024-07-20 NOTE — Progress Notes (Unsigned)
 Cardiology Office Note:    Date:  07/21/2024   ID:  [name] Short, DOB 07-May-1946, MRN [id number]  PCP:  [name] Comer BRAVO, MD  Cardiologist:  [name] LITTIE Nanas, MD  Electrophysiologist:  [name] ONEIDA HOLTS, MD   Referring MD: [name] Comer BRAVO, MD   Chief Complaint  Patient presents with   Atrial Fibrillation     History of Present Illness:    HENCE Short is a 78 y.o. 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  [name] was initially seen on 11/27/2020.  [name] has a history of paroxysmal atrial fibrillation but has not been on anticoagulation.  [name] presented to the ED with slurred speech and left hand weakness on 10/21/20, was found to have acute CVA.  MRI brain showed acute infarct in the posterior right frontal lobe.  [name] had residual left-sided weakness and dysarthria.  Echocardiogram showed normal LVEF.  [name] was started on Eliquis  5 mg twice daily, stroke likely secondary to atrial fibrillation.  [name] had paroxysmal atrial fibrillation while in the hospital, heart rate 90s to 100s while in A. fib.  [name] was started on Cardizem .  [name] presented to [facility] 05/01/22 with chest pain, found to have inferior STEMI.  Cath showed occluded distal LAD, treated with DES x1.  Also noted to have severe stenosis in mid LAD for which [name] underwent staged intervention with DES x1.  Plan was for triple therapy for 1 month with aspirin , Plavix , and Eliquis  then stop aspirin .  Echocardiogram showed EF 50 to 55%.  Cardiac MRI was done on 12/03/2022 which showed normal LV size, wall thickness, and systolic function (EF 58%), normal RV size and systolic function (EF 58%), no LGE.  Zio patch x14 days 01/2021 showed 18% A. fib burden with longest episode lasting 1 day 7 hours, average heart rate in A. fib 80 bpm.  Also showed 5 episodes of SVT, longest lasting 18 seconds.  [name] had ongoing issues with hematuria and was referred  to Dr. HOLTS for [facility] evaluation.  Underwent successful watchman placement 06/2022.  [name] was noted to be in A-fib at clinic visit 12/2023 and Zio patch x 10 days was done, which showed 45% A-fib burden with average rate 99 bpm.  Metoprolol  dose was increased.  Echocardiogram 01/2024 showed EF 55 to 60%, normal RV function, no significant valvular disease.  Since last clinic visit, [name] reports [name] has been having issues with his spinal stenosis, has had several falls in has not been able to walk.  Has upcoming appointment with neurosurgeon.  States that today was his first day leaving the house in 3 weeks.  Does report having lightheadedness.  Denies any chest pain or dyspnea.   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 Cindie    Past Surgical History:  Procedure Laterality Date   APPENDECTOMY  1983   CARDIOVASCULAR STRESS TEST  05-08-2010  DR WALL   NO EVIDENCE OF SCAR OR ISCHEMIA/ EF 54%/  PT HAD BOTH AFIB/ AFLUTTER DURING STUDY   CARPAL TUNNEL RELEASE Right 01/ 14/ 2021   CORONARY STENT INTERVENTION N/A 05/02/2022   Procedure: CORONARY STENT INTERVENTION;  Surgeon: Verlin [name] BIRCH, MD;  Location: [facility];  Service: Cardiovascular;  Laterality: N/A;   CORONARY/GRAFT ACUTE MI REVASCULARIZATION N/A 05/01/2022   Procedure:  Coronary/Graft Acute MI Revascularization;  Surgeon: Verlin [name] BIRCH, MD;  Location: [facility];  Service: Cardiovascular;  Laterality: N/A;   CYSTOSCOPY W/ URETERAL STENT PLACEMENT Left 09/20/2013   Procedure: CYSTOSCOPY WITH STENT REPLACEMENT;  Surgeon: Donnice Gwenyth Brooks, MD;  Location: [facility];  Service: Urology;  Laterality: Left;   CYSTOSCOPY WITH URETEROSCOPY Left 09/20/2013   Procedure: CYSTOSCOPY WITH URETEROSCOPY;  Surgeon: Donnice Gwenyth Brooks, MD;  Location: [facility];  Service: Urology;  Laterality: Left;   CYSTOSCOPY WITH URETEROSCOPY AND STENT PLACEMENT Left 07/12/2013   Procedure: CYSTOSCOPY WITH LITHOLAPEXY, LEFT URETERAL STENT PLACEMENT;  Surgeon: Donnice Gwenyth Brooks, MD;  Location: [facility];  Service: Urology;  Laterality: Left;   CYSTOSCOPY WITH URETEROSCOPY AND STENT PLACEMENT Bilateral 06/23/2014   Procedure: BILATERAL URETEROSCOPY, HOLMIUM LASER LITHOTRIPSY AND STENT PLACEMENT, RIGHT ;  Surgeon: Donnice Brooks, MD;  Location: [facility];  Service: Urology;  Laterality: Bilateral;   EXTRACORPOREAL SHOCK WAVE LITHOTRIPSY  X2   EXTRACORPOREAL SHOCK WAVE LITHOTRIPSY Left 08-29-2013;   07-28-2013   EXTRACORPOREAL SHOCK WAVE LITHOTRIPSY Right 12/09/2018   Procedure: EXTRACORPOREAL SHOCK WAVE LITHOTRIPSY (ESWL);  Surgeon: Brooks Donnice, MD;  Location: [facility];  Service: Urology;  Laterality: Right;   HOLMIUM LASER APPLICATION Left 09/20/2013   Procedure: HOLMIUM LASER APPLICATION;  Surgeon: Donnice Gwenyth Brooks, MD;  Location: [facility];  Service: Urology;  Laterality: Left;   INGUINAL HERNIA REPAIR Bilateral 10/16/2020   Procedure: BILATERAL OPEN INGUINAL HERNIA REPAIR WITH MESH;  Surgeon: Vernetta Berg, MD;  Location: [facility];  Service: General;  Laterality: Bilateral;  LMA/TAP BLOCK   LAPAROSCOPIC INGUINAL HERNIA REPAIR Bilateral  09/04/2010   w/ mesh   LEFT ATRIAL APPENDAGE OCCLUSION N/A 07/17/2022   Procedure: LEFT ATRIAL APPENDAGE OCCLUSION;  Surgeon: Cindie [name] DASEN, MD;  Location: [facility];  Service: Cardiovascular;  Laterality: N/A;   LEFT HEART CATH AND CORONARY ANGIOGRAPHY N/A 05/01/2022   Procedure: LEFT HEART CATH AND CORONARY ANGIOGRAPHY;  Surgeon: Verlin [name] BIRCH, MD;  Location: [facility];  Service: Cardiovascular;  Laterality: N/A;   TEE WITHOUT CARDIOVERSION N/A 07/17/2022   Procedure: TRANSESOPHAGEAL ECHOCARDIOGRAM (TEE);  Surgeon: Cindie [name] DASEN, MD;  Location: [facility] INVASIVE CV [facility];  Service: Cardiovascular;  Laterality: N/A;   TONSILLECTOMY  AS CHILD   TOTAL HIP ARTHROPLASTY Left 05/11/2002   TOTAL HIP ARTHROPLASTY Right 11/15/2012   Procedure: RIGHT TOTAL HIP ARTHROPLASTY ANTERIOR APPROACH;  Surgeon: Oneil JAYSON Herald, MD;  Location: [facility];  Service: Orthopedics;  Laterality: Right;  Right total hip arthroplasty   TRANSTHORACIC ECHOCARDIOGRAM  05/08/2010   NORMAL LVSF/  EF 55%/  GRADE I DIASTOLIC DYSFUNCTION/  MILD BILATERAL ATRIUM ENLARGEMENT   Watchemn device  07/07/2022    Current Medications: Current Meds  Medication  Sig   acetaminophen  (TYLENOL ) 500 MG tablet Take 500-1,000 mg by mouth every 6 (six) hours as needed for moderate pain.   allopurinol  (ZYLOPRIM ) 300 MG tablet Take 1 tablet (300 mg total) by mouth daily.   aspirin  EC 81 MG tablet Take 1 tablet (81 mg total) by mouth daily. Swallow whole.   ciclopirox  (PENLAC ) 8 % solution Apply topically at bedtime. Apply over nail and surrounding skin. Apply daily over previous coat. After seven (7) days, may remove with alcohol and continue cycle.   colchicine  0.6 MG tablet Take 1 tablet (0.6 mg total) by mouth daily as needed (gout).   diphenhydrAMINE  (ALLERGY RELIEF) 25 mg capsule Take 25 mg by mouth every 6 (six) hours as needed.   fluticasone  (FLONASE ) 50 MCG/ACT nasal spray Place 2 sprays into both nostrils daily.    melatonin 5 MG TABS Take 5 mg by mouth at bedtime as needed (sleep).   metoprolol  succinate (TOPROL -XL) 50 MG 24 hr tablet Take 1 tablet (50 mg total) by mouth daily. Take with or immediately following a meal.   Multiple Vitamin (MULTIVITAMIN WITH MINERALS) TABS Take 1 tablet by mouth daily.   nitroGLYCERIN  (NITROSTAT ) 0.4 MG SL tablet Place 1 tablet (0.4 mg total) under the tongue every 5 (five) minutes as needed for chest pain.   Polyethylene Glycol 400 (BLINK TEARS) 0.25 % SOLN Place 1 drop into both eyes 2 (two) times daily as needed (for dryness or irritation).   predniSONE  (DELTASONE ) 10 MG tablet Take 2 tablets (20 mg total) by mouth 2 (two) times daily.   rosuvastatin  (CRESTOR ) 40 MG tablet Take 1 tablet (40 mg total) by mouth daily.   sodium chloride  (OCEAN) 0.65 % SOLN nasal spray Place 1 spray into both nostrils as needed for congestion.   tadalafil (CIALIS) 5 MG tablet Take 5 mg by mouth daily as needed for erectile dysfunction.   traMADol  (ULTRAM ) 50 MG tablet TAKE 1 TABLET BY MOUTH EVERY 6 HOURS AS NEEDED   trolamine salicylate (ASPERCREME) 10 % cream Apply 1 Application topically as needed for muscle pain (Heel).     Allergies:   Shellfish protein-containing drug products and Warfarin sodium   Social History   Socioeconomic History   Marital status: Married    Spouse name: Dawn   Number of children: Not on file   Years of education: Not on file   Highest education level: Bachelor's degree (e.g., BA, AB, BS)  Occupational History   Occupation: retired  Tobacco Use   Smoking status: Former    Types: Cigars   Smokeless tobacco: Never   Tobacco comments:    AVERAGE 3 CIGARS PER WEEK  Vaping Use   Vaping status: Never Used  Substance and Sexual Activity   Alcohol use: Not Currently   Drug use: No   Sexual activity: Not Currently  Other Topics Concern   Not on file  Social History Narrative   Not on file   Social Drivers of Health   Financial Resource Strain:  Low Risk  (05/20/2024)   Overall Financial Resource Strain (CARDIA)    Difficulty of Paying Living Expenses: Not hard at all  Food Insecurity: No Food Insecurity (05/20/2024)   Hunger Vital Sign    Worried About Running Out of Food in the Last Year: Never true    Ran Out of Food in the Last Year: Never true  Transportation Needs: Unmet Transportation Needs (05/20/2024)   PRAPARE - Administrator, Civil Service (Medical): Yes  Lack of Transportation (Non-Medical): Yes  Physical Activity: Inactive (05/20/2024)   Exercise Vital Sign    Days of Exercise per Week: 0 days    Minutes of Exercise per Session: Not on file  Stress: No Stress Concern Present (05/20/2024)   [employer] of Occupational Health - Occupational Stress Questionnaire    Feeling of Stress: Only a little  Social Connections: Socially Isolated (05/20/2024)   Social Connection and Isolation Panel    Frequency of Communication with Friends and Family: Once a week    Frequency of Social Gatherings with Friends and Family: Once a week    Attends Religious Services: Never    Database administrator or Organizations: No    Attends Engineer, structural: Not on file    Marital Status: Married     Family History: The patient's family history includes Arthritis in his mother; Bipolar disorder in his brother; Dementia in his brother and mother; Diabetes in his brother, maternal grandfather, maternal grandmother, mother, and sister; Heart disease in his father; Neuropathy in his brother; Prostate cancer in his father; Rheum arthritis in his mother.  ROS:   Please see the history of present illness.     All other systems reviewed and are negative.  EKGs/Labs/Other Studies Reviewed:    The following studies were reviewed today:  14-Day Monitor 01/03/2021: 18% A. fib burden, longest episode lasting 1 day 7 hours. Average heart rate in A. fib 80 bpm 5 episodes of SVT, longest lasting 18 seconds with average  rate 94 bpm   Patch Wear Time:  14 days and 0 hours (2022-03-17T12:53:47-0400 to 2022-03-31T12:53:51-0400)   Patient had a min HR of 41 bpm, max HR of 144 bpm, and avg HR of 61 bpm. Predominant underlying rhythm was Sinus Rhythm. 5 Supraventricular Tachycardia runs occurred, the run with the fastest interval lasting 14 beats with a max rate of 133 bpm, the  longest lasting 17.9 secs with an avg rate of 94 bpm. Atrial Fibrillation occurred (18% burden), ranging from 49-144 bpm (avg of 80 bpm), the longest lasting 1 day 7 hours with an avg rate of 83 bpm. Isolated SVEs were rare (<1.0%), SVE Couplets were  rare (<1.0%), and SVE Triplets were rare (<1.0%). Isolated VEs were rare (<1.0%), VE Couplets were rare (<1.0%), and no VE Triplets were present.  No patient triggered events.  Echo 10/23/2020: 1. Pt in atrial fibrillation at time of study.   2. Left ventricular ejection fraction, by estimation, is 60 to 65%. The  left ventricle has normal function. The left ventricle has no regional  wall motion abnormalities. There is mild left ventricular hypertrophy.  Left ventricular diastolic function  could not be evaluated.   3. Right ventricular systolic function is normal. The right ventricular  size is normal. Tricuspid regurgitation signal is inadequate for assessing  PA pressure.   4. The mitral valve is normal in structure. Trivial mitral valve  regurgitation. No evidence of mitral stenosis.   5. The aortic valve is tricuspid. Aortic valve regurgitation is trivial.  No aortic stenosis is present.   6. The inferior vena cava is normal in size with greater than 50%  respiratory variability, suggesting right atrial pressure of 3 mmHg.   EKG:    07/21/2024: A-fib with RVR, rate 129, forward progression 01/01/24: Atrial fibrillation, rate 90, poor R wave progression 04/04/2022: Normal sinus rhythm, rate 65, no ST abnormality 08/2021: sinus rhythm, rate 54,no ST abnormalities 02/27/2021: normal sinus  rhythm, rate 52, Nonspecific  T wave flattening 11/27/2020: normal sinus rhythm, rate 65, no ST abnormalities  Recent Labs: 05/23/2024: ALT 16; BUN 14; Creatinine, Ser 1.14; Hemoglobin 14.0; Platelets 192.0; Potassium 4.8; Sodium 145  Recent Lipid Panel    Component Value Date/Time   CHOL 112 05/23/2024 1000   TRIG 98.0 05/23/2024 1000   HDL 42.20 05/23/2024 1000   CHOLHDL 3 05/23/2024 1000   VLDL 19.6 05/23/2024 1000   LDLCALC 50 05/23/2024 1000   LDLDIRECT 128.1 05/17/2012 0901    Physical Exam:    VS:  BP 95/62 (BP Location: Right Arm, Patient Position: Sitting, Cuff Size: Normal)   Ht 5' 7 (1.702 m)   Wt 156 lb (70.8 kg)   SpO2 94%   BMI 24.43 kg/m     Wt Readings from Last 3 Encounters:  07/21/24 156 lb (70.8 kg)  05/23/24 170 lb 6 oz (77.3 kg)  03/02/24 183 lb 13.8 oz (83.4 kg)     GEN:  in no acute distress HEENT: Normal NECK: No JVD; No carotid bruits CARDIAC: Irregular, tachycardic, no murmurs RESPIRATORY:  Clear to auscultation without rales, wheezing or rhonchi  ABDOMEN: Soft, non-tender, non-distended MUSCULOSKELETAL:  No edema; No deformity  SKIN: Warm and dry NEUROLOGIC:  Alert and oriented x 3 PSYCHIATRIC:  Normal affect   ASSESSMENT:    1. PAF (paroxysmal atrial fibrillation) (HCC)   2. Hypotension, unspecified hypotension type   3. Coronary artery disease involving native coronary artery of native heart without angina pectoris      PLAN:    Hypotension: In A-fib with RVR with rates 120s to 130s in clinic today, SBP down to 80s to 90s.  Does report having intermittent lightheadedness.  Has been having issues with spinal stenosis, has been unable to leave the house for last 3 weeks and has had difficulty walking, has upcoming appointment with neurosurgeon.  Given his hypotension and A-fib with RVR, recommend transfer to ED, patient is agreeable.  Called EMS for transport  CAD: [name] presented to [facility] 05/01/22 with chest pain, found to have inferior STEMI.   Cath showed occluded distal LAD, treated with DES x1.  Also noted to have severe stenosis in mid LAD for which [name] underwent staged intervention with DES x1.  Echocardiogram showed EF 50 to 55%. -Continue aspirin  81 mg daily -Continue rosuvastatin  40 mg daily -Continue Toprol -XL   Paroxysmal atrial fibrillation: CHA2DS2-VASc score 4 (age, hypertension, CVA x2).  Echocardiogram 10/23/2020 showed normal biventricular function, no significant valvular disease.  Zio patch x14 days showed 18% A. fib burden with longest episode lasting 1 day 7 hours, average heart rate in A. fib 80 bpm.  Also showed 5 episodes of SVT, longest lasting 18 seconds.  [name] has had ongoing issues with hematuria due to kidney stones, for which [name] follows with urology.  Given this, was referred to Dr. Cindie and underwent Watchman placement 06/2022.  [name] was noted to be in A-fib at clinic visit 12/2023 and Zio patch x 10 days was done, which showed 45% A-fib burden with average rate 99 bpm.  Metoprolol  dose was increased.  Echocardiogram 01/2024 showed EF 55 to 60%, normal RV function, no significant valvular disease. -Completed 45 days of Eliquis  post watchman placement, have discontinued Eliquis  -Continue Toprol -XL 50 mg daily -Sleep study shows mild OSA, no indication for CPAP at this time -In A-fib with RVR and hypotensive in clinic today, planning to send to ED  LVH: Noted on echocardiogram, also has history of bilateral carpal tunnel syndrome.  Cardiac  MRI was done on 12/03/2022 which showed normal LV size, wall thickness, and systolic function (EF 58%), normal RV size and systolic function (EF 58%), no LGE; no evidence of cardiac amyloidosis  CVA: Presented 10/21/2020 with left-sided weakness, found to have acute CVA.  Likely due to A. fib as above.   Hyperlipidemia: on rosuvastatin  40 mg daily.  LDL 26 on 10/2023  Hypertension: Continue Toprol -XL 25 mg daily.  Appears controlled  Snoring: Sleep study shows mild OSA, no indication  for CPAP at this time  Hematuria: Likely due to kidney stones.  Follows with urology.   Given ongoing need for anticoagulation with atrial fibrillation and history of CVA, referred to Dr. Cindie and underwent watchman placement as above   RTC in 1 month    Medication Adjustments/Labs and Tests Ordered: Current medicines are reviewed at length with the patient today.  Concerns regarding medicines are outlined above.  Orders Placed This Encounter  Procedures   EKG 12-Lead   EKG 12-Lead    No orders of the defined types were placed in this encounter.    Patient Instructions  Medication Instructions:  Your physician recommends that you continue on your current medications as directed. Please refer to the Current Medication list given to you today.  *If you need a refill on your cardiac medications before your next appointment, please call your pharmacy*  Lab Work: none If you have labs (blood work) drawn today and your tests are completely normal, you will receive your results only by: MyChart Message (if you have MyChart) OR A paper copy in the mail If you have any lab test that is abnormal or we need to change your treatment, we will call you to review the results.  Testing/Procedures: Pt sent to hospital by EMS BY Provider  Follow-Up: At [facility], you and your health needs are our priority.  As part of our continuing mission to provide you with exceptional heart care, our providers are all part of one team.  This team includes your primary Cardiologist (physician) and Advanced Practice Providers or APPs (Physician Assistants and Nurse Practitioners) who all work together to provide you with the care you need, when you need it.  Your next appointment:   Nov 5 2:20  Provider:   Dr. Kate  We recommend signing up for the patient portal called MyChart.  Sign up information is provided on this After Visit Summary.  MyChart is used to connect with patients for  Virtual Visits (Telemedicine).  Patients are able to view lab/test results, encounter notes, upcoming appointments, etc.  Non-urgent messages can be sent to your provider as well.   To learn more about what you can do with MyChart, go to [email or web address].   Other Instructions none             Signed, [name] LITTIE Kate, MD  07/21/2024 1:00 PM    [facility]

## 2024-07-21 ENCOUNTER — Ambulatory Visit: Admitting: Cardiology

## 2024-07-21 ENCOUNTER — Encounter: Payer: Self-pay | Admitting: Cardiology

## 2024-07-21 ENCOUNTER — Encounter (HOSPITAL_COMMUNITY): Payer: Self-pay

## 2024-07-21 ENCOUNTER — Other Ambulatory Visit: Payer: Self-pay

## 2024-07-21 ENCOUNTER — Emergency Department (HOSPITAL_COMMUNITY)

## 2024-07-21 ENCOUNTER — Emergency Department (HOSPITAL_COMMUNITY)
Admission: EM | Admit: 2024-07-21 | Discharge: 2024-07-21 | Disposition: A | Discharge status: Home / Self Care | Attending: Emergency Medicine | Admitting: Emergency Medicine

## 2024-07-21 VITALS — BP 95/62 | Ht 67.0 in | Wt 156.0 lb

## 2024-07-21 DIAGNOSIS — I4891 Unspecified atrial fibrillation: Secondary | ICD-10-CM | POA: Insufficient documentation

## 2024-07-21 DIAGNOSIS — D72829 Elevated white blood cell count, unspecified: Secondary | ICD-10-CM | POA: Diagnosis not present

## 2024-07-21 DIAGNOSIS — I2510 Atherosclerotic heart disease of native coronary artery without angina pectoris: Secondary | ICD-10-CM | POA: Diagnosis not present

## 2024-07-21 DIAGNOSIS — Z7982 Long term (current) use of aspirin: Secondary | ICD-10-CM | POA: Diagnosis not present

## 2024-07-21 DIAGNOSIS — I444 Left anterior fascicular block: Secondary | ICD-10-CM | POA: Diagnosis not present

## 2024-07-21 DIAGNOSIS — I959 Hypotension, unspecified: Secondary | ICD-10-CM | POA: Diagnosis present

## 2024-07-21 DIAGNOSIS — Z87891 Personal history of nicotine dependence: Secondary | ICD-10-CM | POA: Diagnosis not present

## 2024-07-21 DIAGNOSIS — R9389 Abnormal findings on diagnostic imaging of other specified body structures: Secondary | ICD-10-CM | POA: Diagnosis not present

## 2024-07-21 DIAGNOSIS — Z8673 Personal history of transient ischemic attack (TIA), and cerebral infarction without residual deficits: Secondary | ICD-10-CM | POA: Insufficient documentation

## 2024-07-21 DIAGNOSIS — I480 Paroxysmal atrial fibrillation: Secondary | ICD-10-CM | POA: Diagnosis not present

## 2024-07-21 DIAGNOSIS — I10 Essential (primary) hypertension: Secondary | ICD-10-CM | POA: Diagnosis not present

## 2024-07-21 DIAGNOSIS — R531 Weakness: Secondary | ICD-10-CM | POA: Diagnosis not present

## 2024-07-21 DIAGNOSIS — R42 Dizziness and giddiness: Secondary | ICD-10-CM | POA: Diagnosis not present

## 2024-07-21 LAB — TROPONIN I (HIGH SENSITIVITY)
Troponin I (High Sensitivity): 6 ng/L (ref <18)
Troponin I (High Sensitivity): 7 ng/L (ref <18)

## 2024-07-21 LAB — CBC
HCT: 50.4 % (ref 39.0–52.0)
Hemoglobin: 16.6 g/dL (ref 13.0–17.0)
MCH: 29.6 pg (ref 26.0–34.0)
MCHC: 32.9 g/dL (ref 30.0–36.0)
MCV: 89.8 fL (ref 80.0–100.0)
Platelets: 226 K/uL (ref 150–400)
RBC: 5.61 MIL/uL (ref 4.22–5.81)
RDW: 12.7 % (ref 11.5–15.5)
WBC: 12.8 K/uL — ABNORMAL HIGH (ref 4.0–10.5)
nRBC: 0 % (ref 0.0–0.2)

## 2024-07-21 LAB — BASIC METABOLIC PANEL WITH GFR
Anion gap: 14 (ref 5–15)
BUN: 17 mg/dL (ref 8–23)
CO2: 21 mmol/L — ABNORMAL LOW (ref 22–32)
Calcium: 9.4 mg/dL (ref 8.9–10.3)
Chloride: 103 mmol/L (ref 98–111)
Creatinine, Ser: 1.23 mg/dL (ref 0.61–1.24)
GFR, Estimated: >60 mL/min (ref >60)
Glucose, Bld: 158 mg/dL — ABNORMAL HIGH (ref 70–99)
Potassium: 4 mmol/L (ref 3.5–5.1)
Sodium: 138 mmol/L (ref 135–145)

## 2024-07-21 LAB — MAGNESIUM: Magnesium: 1.9 mg/dL (ref 1.7–2.4)

## 2024-07-21 LAB — BRAIN NATRIURETIC PEPTIDE: B Natriuretic Peptide: 166 pg/mL — ABNORMAL HIGH (ref 0.0–100.0)

## 2024-07-21 LAB — TSH: TSH: 3.427 u[IU]/mL (ref 0.350–4.500)

## 2024-07-21 MED ORDER — TRAMADOL HCL 50 MG PO TABS
50.0000 mg | ORAL_TABLET | Freq: Once | ORAL | Status: AC
  Administered 2024-07-21: 50 mg via ORAL
  Filled 2024-07-21: qty 1

## 2024-07-21 MED ORDER — LACTATED RINGERS IV BOLUS
1000.0000 mL | Freq: Once | INTRAVENOUS | Status: AC
  Administered 2024-07-21: 1000 mL via INTRAVENOUS

## 2024-07-21 MED ORDER — METOPROLOL TARTRATE 5 MG/5ML IV SOLN: 5.0000 mg | INTRAVENOUS | Status: DC | PRN

## 2024-07-21 MED ORDER — METOPROLOL SUCCINATE ER 25 MG PO TB24
50.0000 mg | ORAL_TABLET | Freq: Once | ORAL | Status: DC
  Filled 2024-07-21: qty 2

## 2024-07-21 NOTE — ED Provider Notes (Signed)
 Patient care assumed from previous provider.   Patient care of [name] Short is a 78 y.o. male from previous provider. Please see the original provider note from this emergency department encounter for full history and physical.   Course of Care and my assessment at the time of sign out is detailed in the ED Course below.   Clinical Course as of 07/22/24 1236  Thu Jul 21, 2024  1544 S. 12M.Afib RVR. IVF bolus, metop given [ ]  Labs [ ]  Po Toprol   [LB]  1655 TSH d/c vs cards [LB]  1914 Ambulate then d/c [LB]  2010 Patient refused ambulatory pulse ox [LB]    Clinical Course User Index [LB] Sharlet Dowdy, MD    On my evaluation, patient was normotensive with HR in the 90s. Her reports feeling well, denies CP, SOB, and dizziness.  Labs without significant leukocytosis, anemia or electrolyte abnormalities. BNP with slight elevation and TSH WNL.  Ambulatory pulse ox was ordered to assess patient's readiness for discharge however he declined. Patient reports he does not want to waste his energy and would like to go home. Patient's afib RVR resolved after IVF, labs are reassuring.  Patient was discharged home in stable condition with instructions to follow up with his cardiologist. Strict return precautions were given, patient and wife voiced understanding.   Labs reviewed by myself and considered in medical decision making.  Imaging reviewed by myself and considered in medical decision making. Imaging final read interpreted by radiology.  1. Atrial fibrillation, unspecified type [facility])      Disposition: Discharge   The plan for this patient was discussed with Dr. Charlyn, who voiced agreement and who oversaw evaluation and treatment of this patient.     Sharlet Dowdy, MD 07/22/24 1241    Charlyn Sora, MD 07/23/24 [phone]

## 2024-07-21 NOTE — ED Triage Notes (Signed)
 Pt BIB [facility] from cardiovascular center, pt was hypotentive at dr office at 80 systolic. Yesterday pt reports he felt bad evening time and went to bed, general malaise.   121/76 96-126 HR

## 2024-07-21 NOTE — ED Provider Notes (Signed)
 [place] EMERGENCY DEPARTMENT AT [facility] Provider Note  MDM   HPI/ROS:  [name] Short is a 78 y.o. male with a PMH of CAD s/p inferior STEMI 04/2022, atrial fibrillation, CVA, HTN, HLD medical history as below who presents for atrial fibrillation and hypotension.  Patient was sent from his cardiology follow-up appointment today due to hypotension.  Physical exam is notable for: -***  On my initial evaluation, patient is:  -Vital signs stable.*** Patient afebrile***, hemodynamically stable***, and non-toxic appearing.*** -Additional history obtained from ***  This patient's current presentation, including their history and physical exam, is most consistent with ***. Differentials include ***.     Interpretations, interventions, and the patient's course of care are documented below.      ***   Disposition:  {ED Dispo:29898}  Clinical Impression: No diagnosis found.  Rx / DC Orders ED Discharge Orders     None       The plan for this patient was discussed with Dr. ***, who voiced agreement and who oversaw evaluation and treatment of this patient.   Clinical Complexity A medically appropriate history, review of systems, and physical exam was performed.  My independent interpretations of EKG, labs, and radiology are documented in the ED course above.   If decision rules were used in this patient's evaluation, they are listed below.  *** Click here for ABCD2, HEART and other calculatorsREFRESH Note before signing   Patient's presentation is most consistent with {EM COPA:27473}  Medical Decision Making Amount and/or Complexity of Data Reviewed Labs: ordered. Radiology: ordered.    HPI/ROS      See MDM section for pertinent HPI and ROS. A complete ROS was performed with pertinent positives/negatives noted above.   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 Cindie    Past Surgical History:  Procedure Laterality Date   APPENDECTOMY  1983   CARDIOVASCULAR STRESS TEST  05-08-2010  DR WALL   NO EVIDENCE OF SCAR OR ISCHEMIA/ EF 54%/  PT HAD BOTH AFIB/ AFLUTTER DURING STUDY   CARPAL TUNNEL RELEASE Right 01/ 14/ 2021   CORONARY STENT INTERVENTION N/A 05/02/2022   Procedure: CORONARY STENT INTERVENTION;  Surgeon: Verlin Lonni BIRCH, MD;  Location: [facility];  Service: Cardiovascular;  Laterality: N/A;   CORONARY/GRAFT ACUTE MI REVASCULARIZATION N/A 05/01/2022   Procedure: Coronary/Graft Acute MI Revascularization;  Surgeon: Verlin Lonni BIRCH, MD;  Location: [facility];  Service: Cardiovascular;  Laterality: N/A;   CYSTOSCOPY W/ URETERAL STENT PLACEMENT Left 09/20/2013   Procedure: CYSTOSCOPY WITH STENT REPLACEMENT;  Surgeon: Donnice Gwenyth Brooks, MD;  Location: [facility];  Service: Urology;  Laterality: Left;   CYSTOSCOPY WITH URETEROSCOPY Left 09/20/2013  Procedure: CYSTOSCOPY WITH URETEROSCOPY;  Surgeon: Donnice Gwenyth Brooks, MD;  Location: [facility];  Service: Urology;  Laterality: Left;   CYSTOSCOPY WITH URETEROSCOPY AND STENT PLACEMENT Left 07/12/2013   Procedure: CYSTOSCOPY WITH LITHOLAPEXY, LEFT URETERAL STENT PLACEMENT;  Surgeon: Donnice Gwenyth Brooks, MD;  Location: [facility];  Service:  Urology;  Laterality: Left;   CYSTOSCOPY WITH URETEROSCOPY AND STENT PLACEMENT Bilateral 06/23/2014   Procedure: BILATERAL URETEROSCOPY, HOLMIUM LASER LITHOTRIPSY AND STENT PLACEMENT, RIGHT ;  Surgeon: Donnice Brooks, MD;  Location: [facility];  Service: Urology;  Laterality: Bilateral;   EXTRACORPOREAL SHOCK WAVE LITHOTRIPSY  X2   EXTRACORPOREAL SHOCK WAVE LITHOTRIPSY Left 08-29-2013;   07-28-2013   EXTRACORPOREAL SHOCK WAVE LITHOTRIPSY Right 12/09/2018   Procedure: EXTRACORPOREAL SHOCK WAVE LITHOTRIPSY (ESWL);  Surgeon: Brooks Donnice, MD;  Location: [facility];  Service: Urology;  Laterality: Right;   HOLMIUM LASER APPLICATION Left 09/20/2013   Procedure: HOLMIUM LASER APPLICATION;  Surgeon: Donnice Gwenyth Brooks, MD;  Location: [facility];  Service: Urology;  Laterality: Left;   INGUINAL HERNIA REPAIR Bilateral 10/16/2020   Procedure: BILATERAL OPEN INGUINAL HERNIA REPAIR WITH MESH;  Surgeon: Vernetta Berg, MD;  Location: [facility];  Service: General;  Laterality: Bilateral;  LMA/TAP BLOCK   LAPAROSCOPIC INGUINAL HERNIA REPAIR Bilateral 09/04/2010   w/ mesh   LEFT ATRIAL APPENDAGE OCCLUSION N/A 07/17/2022   Procedure: LEFT ATRIAL APPENDAGE OCCLUSION;  Surgeon: Cindie Ole DASEN, MD;  Location: [facility];  Service: Cardiovascular;  Laterality: N/A;   LEFT HEART CATH AND CORONARY ANGIOGRAPHY N/A 05/01/2022   Procedure: LEFT HEART CATH AND CORONARY ANGIOGRAPHY;  Surgeon: Verlin Lonni BIRCH, MD;  Location: [facility];  Service: Cardiovascular;  Laterality: N/A;   TEE WITHOUT CARDIOVERSION N/A 07/17/2022   Procedure: TRANSESOPHAGEAL ECHOCARDIOGRAM (TEE);  Surgeon: Cindie Ole DASEN, MD;  [facility]: [facility];  Service: Cardiovascular;  Laterality: N/A;   TONSILLECTOMY  AS CHILD   TOTAL HIP ARTHROPLASTY Left 05/11/2002   TOTAL HIP ARTHROPLASTY Right 11/15/2012   Procedure: RIGHT TOTAL HIP ARTHROPLASTY ANTERIOR  APPROACH;  Surgeon: Oneil JAYSON Herald, MD;  Location: [facility];  Service: Orthopedics;  Laterality: Right;  Right total hip arthroplasty   TRANSTHORACIC ECHOCARDIOGRAM  05/08/2010   NORMAL LVSF/  EF 55%/  GRADE I DIASTOLIC DYSFUNCTION/  MILD BILATERAL ATRIUM ENLARGEMENT   Watchemn device  07/07/2022      Physical Exam   Vitals:   07/21/24 1251  BP: (!) 111/90  Pulse: (!) 111  Resp: 20  Temp: 98.6 F (37 C)  TempSrc: Oral  SpO2: 97%    Physical Exam   Procedures   If procedures were preformed on this patient, they are listed below:  Procedures   @BBSIG @   Please note that this documentation was produced with the assistance of voice-to-text technology and may contain errors.

## 2024-07-21 NOTE — Patient Instructions (Signed)
 Medication Instructions:  Your physician recommends that you continue on your current medications as directed. Please refer to the Current Medication list given to you today.  *If you need a refill on your cardiac medications before your next appointment, please call your pharmacy*  Lab Work: none If you have labs (blood work) drawn today and your tests are completely normal, you will receive your results only by: MyChart Message (if you have MyChart) OR A paper copy in the mail If you have any lab test that is abnormal or we need to change your treatment, we will call you to review the results.  Testing/Procedures: Pt sent to hospital by EMS BY Provider  Follow-Up: At [facility], you and your health needs are our priority.  As part of our continuing mission to provide you with exceptional heart care, our providers are all part of one team.  This team includes your primary Cardiologist (physician) and Advanced Practice Providers or APPs (Physician Assistants and Nurse Practitioners) who all work together to provide you with the care you need, when you need it.  Your next appointment:   Nov 5 2:20  Provider:   Dr. Kate  We recommend signing up for the patient portal called MyChart.  Sign up information is provided on this After Visit Summary.  MyChart is used to connect with patients for Virtual Visits (Telemedicine).  Patients are able to view lab/test results, encounter notes, upcoming appointments, etc.  Non-urgent messages can be sent to your provider as well.   To learn more about what you can do with MyChart, go to [email or web address].   Other Instructions none

## 2024-07-21 NOTE — ED Provider Notes (Incomplete)
 Patient is a 78 year old male with a history of paroxysmal atrial fibrillation status post watchman's on aspirin  but no other anticoagulation, chronic back pain with recent injections in his spine, recent weight loss of approximately 15 pounds with poor appetite who is presenting today from the cardiology office for A-fib RVR and hypotension.  Patient reports in the last few days he has just felt unwell.  Some dizziness with standing but just feeling generally yucky.  No nausea or vomiting, no shortness of breath or chest pain.  No unilateral symptoms.  He has no evidence of fluid overload but appears fluid down on exam.  Will ensure no evidence of change in renal function, electrolyte abnormality.  Patient is in A-fib RVR here but has also not had his beta-blockers.  Repeat blood pressure improved here from cardiology office.  Will give patient fluids, beta-blocker for rate control and labs are pending. BMP with creatinine 1.23 today from baseline of 1, CBC with mild leukocytosis of 12 and troponin within normal limit.  Chest x-ray without acute findings.

## 2024-07-21 NOTE — ED Notes (Signed)
 Pt refused ambulation trial. Pts wife stated she did not want him to waste his energy getting up. Pt and family educated on reasoning for ambulation trial, but continued to refuse. MD Sharlet made aware

## 2024-07-21 NOTE — ED Notes (Signed)
 Pt and wife refused to ambulate.

## 2024-07-21 NOTE — Discharge Instructions (Signed)
 You were seen today for afib with high heart rate. While you were here we monitored your vitals, preformed a physical exam, and the IV fluids. These were all reassuring and there is no indication for any further testing or intervention in the emergency department at this time.   Things to do:  - Follow up with your primary care provider within the next 1-2 weeks - Continue taking your metoprolol  as prescribed - Follow-up with your cardiologist as soon as possible  Return to the emergency department if you have any new or worsening symptoms including racing heart, chest pain, passing out, or if you have any other concerns.

## 2024-07-23 ENCOUNTER — Other Ambulatory Visit: Payer: Self-pay | Admitting: Orthopedic Surgery

## 2024-07-31 NOTE — Progress Notes (Unsigned)
 Cardiology Office Note:    Date:  08/03/2024   ID:  [name] Short, DOB 1946-01-16, MRN [id number]  PCP:  Mahlon Comer BRAVO, MD  Cardiologist:  Lonni LITTIE Nanas, MD  Electrophysiologist:  OLE ONEIDA HOLTS, MD   Referring MD: Mahlon Comer BRAVO, MD   Chief Complaint  Patient presents with   Atrial Fibrillation     History of Present Illness:    [name] Short is a 78 y.o. 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  He was initially seen on 11/27/2020.  He has a history of paroxysmal atrial fibrillation but has not been on anticoagulation.  He presented to the ED with slurred speech and left hand weakness on 10/21/20, was found to have acute CVA.  MRI brain showed acute infarct in the posterior right frontal lobe.  He had residual left-sided weakness and dysarthria.  Echocardiogram showed normal LVEF.  He was started on Eliquis  5 mg twice daily, stroke likely secondary to atrial fibrillation.  He had paroxysmal atrial fibrillation while in the hospital, heart rate 90s to 100s while in A. fib.  He was started on Cardizem .  He presented to [facility]01[facility] with chest pain, found to have inferior STEMI.  Cath showed occluded distal LAD, treated with DES x1.  Also noted to have severe stenosis in mid LAD for which he underwent staged intervention with DES x1.  Plan was for triple therapy for 1 month with aspirin , Plavix , and Eliquis  then stop aspirin .  Echocardiogram showed EF 50 to 55%.  Cardiac MRI was done on 12/03/2022 which showed normal LV size, wall thickness, and systolic function (EF 58%), normal RV size and systolic function (EF 58%), no LGE.  Zio patch x14 days 01/2021 showed 18% A. fib burden with longest episode lasting 1 day 7 hours, average heart rate in A. fib 80 bpm.  Also showed 5 episodes of SVT, longest lasting 18 seconds.  He had ongoing issues with hematuria and was referred to  Dr. Holts for [facility] evaluation.  Underwent successful watchman placement 06/2022.  He was noted to be in A-fib at clinic visit 12/2023 and Zio patch x 10 days was done, which showed 45% A-fib burden with average rate 99 bpm.  Metoprolol  dose was increased.  Echocardiogram 01/2024 showed EF 55 to 60%, normal RV function, no significant valvular disease.  At clinic visit 07/21/2024, noted to be hypotensive down to SBP 80s and in A-fib with RVR.  He was sent to ED.  Improved with IV fluids and beta-blocker for rate control.  Since last clinic visit, he reports he is doing better.  Main issue is that he feels tired and continues to have back pain and limited mobility.  Reports prior to his back issues he was active, could walk up 2 flights of stairs without any symptoms.  He denies any chest pain or dyspnea currently.   Past Medical History:  Diagnosis Date   Arthritis    WRIST   Benign localized prostatic hyperplasia with lower urinary tract symptoms (LUTS)    Complication of anesthesia    slow to wake   ED (erectile dysfunction)    GERD (gastroesophageal reflux disease)    History of concussion    AS CHILD--  NO RESIDUAL   History of gout    History of kidney stones    Hyperlipidemia    Inguinal hernia, bilateral    Nephrolithiasis  BILATERAL   PAF (paroxysmal atrial fibrillation) (HCC) currently being followed by pcp   EPISODE --  2011  FOLLOW-UP W/ DR WALL  /  HAS NOT SEEN ANY CARDIOLOGIST SINCE   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 Cindie    Past Surgical History:  Procedure Laterality Date   APPENDECTOMY  1983   CARDIOVASCULAR STRESS TEST  05-08-2010  DR WALL   NO EVIDENCE OF SCAR OR ISCHEMIA/ EF 54%/  PT HAD BOTH AFIB/ AFLUTTER DURING STUDY   CARPAL TUNNEL RELEASE Right 01/ 14/ 2021   CORONARY STENT INTERVENTION N/A 05/02/2022   Procedure: CORONARY STENT INTERVENTION;  Surgeon: Verlin Lonni BIRCH, MD;   Location: [facility];  Service: Cardiovascular;  Laterality: N/A;   CORONARY/GRAFT ACUTE MI REVASCULARIZATION N/A 05/01/2022   Procedure: Coronary/Graft Acute MI Revascularization;  Surgeon: Verlin Lonni BIRCH, MD;  Location: [facility];  Service: Cardiovascular;  Laterality: N/A;   CYSTOSCOPY W/ URETERAL STENT PLACEMENT Left 09/20/2013   Procedure: CYSTOSCOPY WITH STENT REPLACEMENT;  Surgeon: Donnice Gwenyth Brooks, MD;  Location: [facility];  Service: Urology;  Laterality: Left;   CYSTOSCOPY WITH URETEROSCOPY Left 09/20/2013   Procedure: CYSTOSCOPY WITH URETEROSCOPY;  Surgeon: Donnice Gwenyth Brooks, MD;  Location: [facility];  Service: Urology;  Laterality: Left;   CYSTOSCOPY WITH URETEROSCOPY AND STENT PLACEMENT Left 07/12/2013   Procedure: CYSTOSCOPY WITH LITHOLAPEXY, LEFT URETERAL STENT PLACEMENT;  Surgeon: Donnice Gwenyth Brooks, MD;  Location: [facility];  Service: Urology;  Laterality: Left;   CYSTOSCOPY WITH URETEROSCOPY AND STENT PLACEMENT Bilateral 06/23/2014   Procedure: BILATERAL URETEROSCOPY, HOLMIUM LASER LITHOTRIPSY AND STENT PLACEMENT, RIGHT ;  Surgeon: Donnice Brooks, MD;  Location: [facility];  Service: Urology;  Laterality: Bilateral;   EXTRACORPOREAL SHOCK WAVE LITHOTRIPSY  X2   EXTRACORPOREAL SHOCK WAVE LITHOTRIPSY Left 08-29-2013;   07-28-2013   EXTRACORPOREAL SHOCK WAVE LITHOTRIPSY Right 12/09/2018   Procedure: EXTRACORPOREAL SHOCK WAVE LITHOTRIPSY (ESWL);  Surgeon: Brooks Donnice, MD;  Location: [facility];  Service: Urology;  Laterality: Right;   HOLMIUM LASER APPLICATION Left 09/20/2013   Procedure: HOLMIUM LASER APPLICATION;  Surgeon: Donnice Gwenyth Brooks, MD;  Location: [facility];  Service: Urology;  Laterality: Left;   INGUINAL HERNIA REPAIR Bilateral 10/16/2020   Procedure: BILATERAL OPEN INGUINAL HERNIA REPAIR WITH MESH;  Surgeon: Vernetta Berg, MD;   Location: [facility];  Service: General;  Laterality: Bilateral;  LMA/TAP BLOCK   LAPAROSCOPIC INGUINAL HERNIA REPAIR Bilateral 09/04/2010   w/ mesh   LEFT ATRIAL APPENDAGE OCCLUSION N/A 07/17/2022   Procedure: LEFT ATRIAL APPENDAGE OCCLUSION;  Surgeon: Cindie Ole DASEN, MD;  Location: [facility];  Service: Cardiovascular;  Laterality: N/A;   LEFT HEART CATH AND CORONARY ANGIOGRAPHY N/A 05/01/2022   Procedure: LEFT HEART CATH AND CORONARY ANGIOGRAPHY;  Surgeon: Verlin Lonni BIRCH, MD;  Location: [facility];  Service: Cardiovascular;  Laterality: N/A;   TEE WITHOUT CARDIOVERSION N/A 07/17/2022   Procedure: TRANSESOPHAGEAL ECHOCARDIOGRAM (TEE);  Surgeon: Cindie Ole DASEN, MD;  Location: [facility] INVASIVE CV LAB;  Service: Cardiovascular;  Laterality: N/A;   TONSILLECTOMY  AS CHILD   TOTAL HIP ARTHROPLASTY Left 05/11/2002   TOTAL HIP ARTHROPLASTY Right 11/15/2012   Procedure: RIGHT TOTAL HIP ARTHROPLASTY ANTERIOR APPROACH;  Surgeon: Oneil JAYSON Herald, MD;  Location: [facility];  Service: Orthopedics;  Laterality: Right;  Right total hip arthroplasty   TRANSTHORACIC ECHOCARDIOGRAM  05/08/2010  NORMAL LVSF/  EF 55%/  GRADE I DIASTOLIC DYSFUNCTION/  MILD BILATERAL ATRIUM ENLARGEMENT   Watchemn device  07/07/2022    Current Medications: Current Meds  Medication Sig   acetaminophen  (TYLENOL ) 500 MG tablet Take 500-1,000 mg by mouth every 6 (six) hours as needed for moderate pain.   allopurinol  (ZYLOPRIM ) 300 MG tablet Take 1 tablet (300 mg total) by mouth daily.   amiodarone (PACERONE) 200 MG tablet Take one tablet by mouth twice daily x 2 weeks then decrease to one tablet by mouth daily   aspirin  EC 81 MG tablet Take 1 tablet (81 mg total) by mouth daily. Swallow whole.   ciclopirox  (PENLAC ) 8 % solution Apply topically at bedtime. Apply over nail and surrounding skin. Apply daily over previous coat. After seven (7) days, may remove with alcohol and continue cycle.    colchicine  0.6 MG tablet Take 1 tablet (0.6 mg total) by mouth daily as needed (gout).   diphenhydrAMINE  (ALLERGY RELIEF) 25 mg capsule Take 25 mg by mouth every 6 (six) hours as needed.   fluticasone  (FLONASE ) 50 MCG/ACT nasal spray Place 2 sprays into both nostrils daily.   melatonin 5 MG TABS Take 5 mg by mouth at bedtime as needed (sleep).   metoprolol  succinate (TOPROL -XL) 50 MG 24 hr tablet Take 1 tablet (50 mg total) by mouth daily. Take with or immediately following a meal.   Multiple Vitamin (MULTIVITAMIN WITH MINERALS) TABS Take 1 tablet by mouth daily.   nitroGLYCERIN  (NITROSTAT ) 0.4 MG SL tablet Place 1 tablet (0.4 mg total) under the tongue every 5 (five) minutes as needed for chest pain.   Polyethylene Glycol 400 (BLINK TEARS) 0.25 % SOLN Place 1 drop into both eyes 2 (two) times daily as needed (for dryness or irritation).   predniSONE  (DELTASONE ) 10 MG tablet Take 2 tablets (20 mg total) by mouth 2 (two) times daily.   rosuvastatin  (CRESTOR ) 40 MG tablet Take 1 tablet (40 mg total) by mouth daily.   sodium chloride  (OCEAN) 0.65 % SOLN nasal spray Place 1 spray into both nostrils as needed for congestion.   tadalafil (CIALIS) 5 MG tablet Take 5 mg by mouth daily as needed for erectile dysfunction.   traMADol  (ULTRAM ) 50 MG tablet TAKE 1 TABLET BY MOUTH EVERY 6 HOURS AS NEEDED   trolamine salicylate (ASPERCREME) 10 % cream Apply 1 Application topically as needed for muscle pain (Heel).     Allergies:   Shellfish protein-containing drug products and Warfarin sodium   Social History   Socioeconomic History   Marital status: Married    Spouse name: Dawn   Number of children: Not on file   Years of education: Not on file   Highest education level: Bachelor's degree (e.g., BA, AB, BS)  Occupational History   Occupation: retired  Tobacco Use   Smoking status: Former    Types: Cigars   Smokeless tobacco: Never   Tobacco comments:    AVERAGE 3 CIGARS PER WEEK  Vaping Use    Vaping status: Never Used  Substance and Sexual Activity   Alcohol use: Not Currently   Drug use: No   Sexual activity: Not Currently  Other Topics Concern   Not on file  Social History Narrative   Not on file   Social Drivers of Health   Financial Resource Strain: Low Risk  (05/20/2024)   Overall Financial Resource Strain (CARDIA)    Difficulty of Paying Living Expenses: Not hard at all  Food Insecurity: No Food Insecurity (  05/20/2024)   Hunger Vital Sign    Worried About Running Out of Food in the Last Year: Never true    Ran Out of Food in the Last Year: Never true  Transportation Needs: Unmet Transportation Needs (05/20/2024)   PRAPARE - Transportation    Lack of Transportation (Medical): Yes    Lack of Transportation (Non-Medical): Yes  Physical Activity: Inactive (05/20/2024)   Exercise Vital Sign    Days of Exercise per Week: 0 days    Minutes of Exercise per Session: Not on file  Stress: No Stress Concern Present (05/20/2024)   [employer] of Occupational Health - Occupational Stress Questionnaire    Feeling of Stress: Only a little  Social Connections: Socially Isolated (05/20/2024)   Social Connection and Isolation Panel    Frequency of Communication with Friends and Family: Once a week    Frequency of Social Gatherings with Friends and Family: Once a week    Attends Religious Services: Never    Database Administrator or Organizations: No    Attends Engineer, Structural: Not on file    Marital Status: Married     Family History: The patient's family history includes Arthritis in his mother; Bipolar disorder in his brother; Dementia in his brother and mother; Diabetes in his brother, maternal grandfather, maternal grandmother, mother, and sister; Heart disease in his father; Neuropathy in his brother; Prostate cancer in his father; Rheum arthritis in his mother.  ROS:   Please see the history of present illness.     All other systems reviewed and are  negative.  EKGs/Labs/Other Studies Reviewed:    The following studies were reviewed today:  14-Day Monitor 01/03/2021: 18% A. fib burden, longest episode lasting 1 day 7 hours. Average heart rate in A. fib 80 bpm 5 episodes of SVT, longest lasting 18 seconds with average rate 94 bpm   Patch Wear Time:  14 days and 0 hours (2022-03-17T12:53:47-0400 to 2022-03-31T12:53:51-0400)   Patient had a min HR of 41 bpm, max HR of 144 bpm, and avg HR of 61 bpm. Predominant underlying rhythm was Sinus Rhythm. 5 Supraventricular Tachycardia runs occurred, the run with the fastest interval lasting 14 beats with a max rate of 133 bpm, the  longest lasting 17.9 secs with an avg rate of 94 bpm. Atrial Fibrillation occurred (18% burden), ranging from 49-144 bpm (avg of 80 bpm), the longest lasting 1 day 7 hours with an avg rate of 83 bpm. Isolated SVEs were rare (<1.0%), SVE Couplets were  rare (<1.0%), and SVE Triplets were rare (<1.0%). Isolated VEs were rare (<1.0%), VE Couplets were rare (<1.0%), and no VE Triplets were present.  No patient triggered events.  Echo 10/23/2020: 1. Pt in atrial fibrillation at time of study.   2. Left ventricular ejection fraction, by estimation, is 60 to 65%. The  left ventricle has normal function. The left ventricle has no regional  wall motion abnormalities. There is mild left ventricular hypertrophy.  Left ventricular diastolic function  could not be evaluated.   3. Right ventricular systolic function is normal. The right ventricular  size is normal. Tricuspid regurgitation signal is inadequate for assessing  PA pressure.   4. The mitral valve is normal in structure. Trivial mitral valve  regurgitation. No evidence of mitral stenosis.   5. The aortic valve is tricuspid. Aortic valve regurgitation is trivial.  No aortic stenosis is present.   6. The inferior vena cava is normal in size with greater than 50%  respiratory variability, suggesting right atrial pressure of  3 mmHg.   EKG:    08/03/2024: Normal sinus rhythm, rate 76, left axis deviation, QTc 427 07/21/2024: A-fib with RVR, rate 129, forward progression 01/01/24: Atrial fibrillation, rate 90, poor R wave progression 04/04/2022: Normal sinus rhythm, rate 65, no ST abnormality 08/2021: sinus rhythm, rate 54,no ST abnormalities 02/27/2021: normal sinus rhythm, rate 52, Nonspecific T wave flattening 11/27/2020: normal sinus rhythm, rate 65, no ST abnormalities  Recent Labs: 05/23/2024: ALT 16 07/21/2024: B Natriuretic Peptide 166.0; BUN 17; Creatinine, Ser 1.23; Hemoglobin 16.6; Magnesium 1.9; Platelets 226; Potassium 4.0; Sodium 138; TSH 3.427  Recent Lipid Panel    Component Value Date/Time   CHOL 112 05/23/2024 1000   TRIG 98.0 05/23/2024 1000   HDL 42.20 05/23/2024 1000   CHOLHDL 3 05/23/2024 1000   VLDL 19.6 05/23/2024 1000   LDLCALC 50 05/23/2024 1000   LDLDIRECT 128.1 05/17/2012 0901    Physical Exam:    VS:  BP (!) 144/90 (BP Location: Right Arm, Patient Position: Sitting, Cuff Size: Normal)   Pulse 76   Ht 5' 7 (1.702 m)   Wt 148 lb 3.2 oz (67.2 kg)   SpO2 94%   BMI 23.21 kg/m     Wt Readings from Last 3 Encounters:  08/03/24 148 lb 3.2 oz (67.2 kg)  07/21/24 156 lb (70.8 kg)  05/23/24 170 lb 6 oz (77.3 kg)     GEN:  in no acute distress HEENT: Normal NECK: No JVD; No carotid bruits CARDIAC: Irregular, tachycardic, no murmurs RESPIRATORY:  Clear to auscultation without rales, wheezing or rhonchi  ABDOMEN: Soft, non-tender, non-distended MUSCULOSKELETAL:  No edema; No deformity  SKIN: Warm and dry NEUROLOGIC:  Alert and oriented x 3 PSYCHIATRIC:  Normal affect   ASSESSMENT:    1. PAF (paroxysmal atrial fibrillation) (HCC)   2. Pre-op evaluation   3. Coronary artery disease involving native coronary artery of native heart without angina pectoris   4. Cerebrovascular accident (CVA) due to embolism of right middle cerebral artery (HCC)       PLAN:    Preop  evaluation: Likely planning surgery for spinal stenosis.  He denies any anginal symptoms but activity has been very limited recently due to his back pain.  However he reports prior to his back issues earlier this year he was very active, can walk up 2 flights of stairs without stopping and denied any exertional symptoms.  No further cardiac workup recommended prior to surgery.  Would continue aspirin  perioperatively given his history of coronary stenting  Paroxysmal atrial fibrillation: CHA2DS2-VASc score 4 (age, hypertension, CVA x2).  Echocardiogram 10/23/2020 showed normal biventricular function, no significant valvular disease.  Zio patch x14 days showed 18% A. fib burden with longest episode lasting 1 day 7 hours, average heart rate in A. fib 80 bpm.  Also showed 5 episodes of SVT, longest lasting 18 seconds.  He has had ongoing issues with hematuria due to kidney stones, for which he follows with urology.  Given this, was referred to Dr. Cindie and underwent Watchman placement 06/2022.  He was noted to be in A-fib at clinic visit 12/2023 and Zio patch x 10 days was done, which showed 45% A-fib burden with average rate 99 bpm.  Metoprolol  dose was increased.  Echocardiogram 01/2024 showed EF 55 to 60%, normal RV function, no significant valvular disease. -Completed 45 days of Eliquis  post watchman placement, have discontinued Eliquis  -Continue Toprol -XL 50 mg daily -Sleep study shows mild OSA, no  indication for CPAP at this time - Currently in sinus rhythm in clinic today.  Given high A-fib burden on recent monitor, an episode of A-fib with RVR and soft BP requiring ED visit 06/2024, and considering likely upcoming surgery for his spinal stenosis, recommend starting amiodarone to maintain sinus rhythm.  Would start 200 mg twice daily x 2 weeks then reduce to 200 mg daily.  Once recovers from his surgery, can plan referral to Dr. Cindie to consider ablation.  Recent LFTs and TSH unremarkable  CAD: He  presented [facility] with chest pain, found to have inferior STEMI.  Cath showed occluded distal LAD, treated with DES x1.  Also noted to have severe stenosis in mid LAD for which he underwent staged intervention with DES x1.  Echocardiogram showed EF 50 to 55%. -Continue aspirin  81 mg daily -Continue rosuvastatin  40 mg daily -Continue Toprol -XL   LVH: Noted on echocardiogram, also has history of bilateral carpal tunnel syndrome.  Cardiac MRI was done on 12/03/2022 which showed normal LV size, wall thickness, and systolic function (EF 58%), normal RV size and systolic function (EF 58%), no LGE; no evidence of cardiac amyloidosis  CVA: Presented 10/21/2020 with left-sided weakness, found to have acute CVA.  Likely due to A. fib as above.   Hyperlipidemia: on rosuvastatin  40 mg daily.  LDL 26 on 10/2023  Hypertension: Continue Toprol -XL 25 mg daily.  Appears controlled  Snoring: Sleep study shows mild OSA, no indication for CPAP at this time  Hematuria: Likely due to kidney stones.  Follows with urology.   Given ongoing need for anticoagulation with atrial fibrillation and history of CVA, referred to Dr. Cindie and underwent watchman placement as above   RTC in 6-8 weeks    Medication Adjustments/Labs and Tests Ordered: Current medicines are reviewed at length with the patient today.  Concerns regarding medicines are outlined above.  Orders Placed This Encounter  Procedures   EKG 12-Lead    Meds ordered this encounter  Medications   amiodarone (PACERONE) 200 MG tablet    Sig: Take one tablet by mouth twice daily x 2 weeks then decrease to one tablet by mouth daily    Dispense:  90 tablet    Refill:  3     Patient Instructions  Medication Instructions:  Your physician has recommended you make the following change in your medication:   ** Begin Amiodarone 200mg  - 1 tablet by mouth twice daily x 14 days then decrease to 1 tablet by mouth daily after that.  *If you need a refill  on your cardiac medications before your next appointment, please call your pharmacy*  Lab Work: None ordered.  If you have labs (blood work) drawn today and your tests are completely normal, you will receive your results only by: MyChart Message (if you have MyChart) OR A paper copy in the mail If you have any lab test that is abnormal or we need to change your treatment, we will call you to review the results.  Testing/Procedures: None ordered.   Follow-Up: At [facility], you and your health needs are our priority.  As part of our continuing mission to provide you with exceptional heart care, our providers are all part of one team.  This team includes your primary Cardiologist (physician) and Advanced Practice Providers or APPs (Physician Assistants and Nurse Practitioners) who all work together to provide you with the care you need, when you need it.  Your next appointment:   6-8 weeks  with Dr Kate             Signed, Lonni LITTIE Kate, MD  08/03/2024 5:26 PM    [facility]

## 2024-08-01 ENCOUNTER — Ambulatory Visit: Admitting: Orthopedic Surgery

## 2024-08-01 ENCOUNTER — Encounter: Payer: Self-pay | Admitting: Radiology

## 2024-08-01 DIAGNOSIS — M5416 Radiculopathy, lumbar region: Secondary | ICD-10-CM

## 2024-08-01 NOTE — Progress Notes (Signed)
 Orthopedic Spine Surgery Office Note   Assessment: Patient is a 78 y.o. male with low back pain that radiates into bilateral lower extremities along the posterior aspect of the thigh and legs.  Has stenosis at L3/4 and L4/5     Plan: -Patient has tried PT, tylenol , prednisone , tramadol , lumbar steroid injection -Injections are no longer providing him with satisfactory relief so discussed L3-5 laminectomy as a treatment option. I covered the risks, benefits, and alternatives. We went over the typical post-operative recovery as well. After this discussion, he and his wife wanted to think about it more -Patient will let me know how he would like to proceed     Patient expressed understanding of the plan and all questions were answered to the patient's satisfaction.    ___________________________________________________________________________     History:   Patient is a 78 y.o. male who presents today for follow up on his lumbar spine. Patient ended up getting a second injection into his lumbar spine since he was last seen in the office. He did not get as much relief with that injection. He is having numbness and paresthesias distal to the knee which causes him issues with balance. He has felt his legs are getting weaker. He is having difficulty ambulating since pain and numbness returned. He is back to walking very short distances and is not able to get around like he was after the first injection with a cane. He is having pain in his low back going into his posterior thighs and legs. No bowel or bladder incontinence. No saddle anesthesia.    Treatments tried: PT, tylenol , prednisone , tramadol , lumbar steroid injection     Physical Exam:   General: no acute distress, appears stated age, sitting in wheelchair Neurologic: alert, answering questions appropriately, following commands Respiratory: unlabored breathing on room air, symmetric chest rise Psychiatric: appropriate affect, normal  cadence to speech     MSK (spine)   -Strength exam                                                   Left                  Right EHL                              4/5                  4/5 TA                                 5/5                  5/5 GSC                             5/5                  5/5 Knee extension            5/5                  5/5 Hip flexion  4+/5                4+/5   -Sensory exam                           Sensation intact to light touch in L3-S1 nerve distributions of bilateral lower extremities   Imaging: XRs of the lumbar spine from 03/02/2024 were previously independently reviewed and interpreted, showing lumbar scoliotic curvature that measures 17 degrees with apex to the right.  Disc height loss at L1/2, L3/4 and L4/5.  No other significant degenerative changes.  No evidence of instability on flexion/extension views.  No fracture or dislocation seen.   MRI of the lumbar spine from 02/25/2024 was previously independently reviewed and interpreted, showing significant disc height loss with suspected autofusion at L1/2.  Central and lateral recess stenosis at L3/4.  Bilateral lateral recess stenosis at L4/5.     Patient name: [name] Short Patient MRN: [id number] Date of visit: 08/01/24

## 2024-08-02 ENCOUNTER — Other Ambulatory Visit: Payer: Self-pay | Admitting: Orthopedic Surgery

## 2024-08-03 ENCOUNTER — Encounter: Payer: Self-pay | Admitting: Cardiology

## 2024-08-03 ENCOUNTER — Ambulatory Visit: Admitting: Orthopedic Surgery

## 2024-08-03 ENCOUNTER — Ambulatory Visit: Attending: Cardiology | Admitting: Cardiology

## 2024-08-03 VITALS — BP 144/90 | HR 76 | Ht 67.0 in | Wt 148.2 lb

## 2024-08-03 DIAGNOSIS — Z01818 Encounter for other preprocedural examination: Secondary | ICD-10-CM | POA: Diagnosis not present

## 2024-08-03 DIAGNOSIS — I480 Paroxysmal atrial fibrillation: Secondary | ICD-10-CM | POA: Insufficient documentation

## 2024-08-03 DIAGNOSIS — I2510 Atherosclerotic heart disease of native coronary artery without angina pectoris: Secondary | ICD-10-CM | POA: Insufficient documentation

## 2024-08-03 DIAGNOSIS — I63411 Cerebral infarction due to embolism of right middle cerebral artery: Secondary | ICD-10-CM | POA: Insufficient documentation

## 2024-08-03 MED ORDER — AMIODARONE HCL 200 MG PO TABS: ORAL_TABLET | ORAL | 3 refills | Status: AC

## 2024-08-03 NOTE — Patient Instructions (Signed)
 Medication Instructions:  Your physician has recommended you make the following change in your medication:   ** Begin Amiodarone 200mg  - 1 tablet by mouth twice daily x 14 days then decrease to 1 tablet by mouth daily after that.  *If you need a refill on your cardiac medications before your next appointment, please call your pharmacy*  Lab Work: None ordered.  If you have labs (blood work) drawn today and your tests are completely normal, you will receive your results only by: MyChart Message (if you have MyChart) OR A paper copy in the mail If you have any lab test that is abnormal or we need to change your treatment, we will call you to review the results.  Testing/Procedures: None ordered.   Follow-Up: At [facility], you and your health needs are our priority.  As part of our continuing mission to provide you with exceptional heart care, our providers are all part of one team.  This team includes your primary Cardiologist (physician) and Advanced Practice Providers or APPs (Physician Assistants and Nurse Practitioners) who all work together to provide you with the care you need, when you need it.  Your next appointment:   6-8 weeks with Dr Kate

## 2024-08-16 ENCOUNTER — Other Ambulatory Visit: Payer: Self-pay | Admitting: Cardiology

## 2024-08-22 DIAGNOSIS — M4150 Other secondary scoliosis, site unspecified: Secondary | ICD-10-CM | POA: Diagnosis not present

## 2024-08-22 DIAGNOSIS — M48062 Spinal stenosis, lumbar region with neurogenic claudication: Secondary | ICD-10-CM | POA: Diagnosis not present

## 2024-08-22 DIAGNOSIS — M5441 Lumbago with sciatica, right side: Secondary | ICD-10-CM | POA: Diagnosis not present

## 2024-08-22 DIAGNOSIS — G8929 Other chronic pain: Secondary | ICD-10-CM | POA: Diagnosis not present

## 2024-08-22 DIAGNOSIS — M545 Low back pain, unspecified: Secondary | ICD-10-CM | POA: Diagnosis not present

## 2024-08-22 DIAGNOSIS — M5442 Lumbago with sciatica, left side: Secondary | ICD-10-CM | POA: Diagnosis not present

## 2024-08-31 DIAGNOSIS — Z95818 Presence of other cardiac implants and grafts: Secondary | ICD-10-CM | POA: Diagnosis not present

## 2024-08-31 DIAGNOSIS — I69398 Other sequelae of cerebral infarction: Secondary | ICD-10-CM | POA: Diagnosis not present

## 2024-08-31 DIAGNOSIS — I480 Paroxysmal atrial fibrillation: Secondary | ICD-10-CM | POA: Diagnosis not present

## 2024-08-31 DIAGNOSIS — M109 Gout, unspecified: Secondary | ICD-10-CM | POA: Diagnosis not present

## 2024-08-31 DIAGNOSIS — R200 Anesthesia of skin: Secondary | ICD-10-CM | POA: Diagnosis not present

## 2024-08-31 DIAGNOSIS — I2510 Atherosclerotic heart disease of native coronary artery without angina pectoris: Secondary | ICD-10-CM | POA: Diagnosis not present

## 2024-08-31 DIAGNOSIS — Z955 Presence of coronary angioplasty implant and graft: Secondary | ICD-10-CM | POA: Diagnosis not present

## 2024-08-31 DIAGNOSIS — Z8673 Personal history of transient ischemic attack (TIA), and cerebral infarction without residual deficits: Secondary | ICD-10-CM | POA: Diagnosis not present

## 2024-08-31 DIAGNOSIS — Z87891 Personal history of nicotine dependence: Secondary | ICD-10-CM | POA: Diagnosis not present

## 2024-08-31 DIAGNOSIS — Z79899 Other long term (current) drug therapy: Secondary | ICD-10-CM | POA: Diagnosis not present

## 2024-08-31 DIAGNOSIS — G4733 Obstructive sleep apnea (adult) (pediatric): Secondary | ICD-10-CM | POA: Diagnosis not present

## 2024-08-31 DIAGNOSIS — I4891 Unspecified atrial fibrillation: Secondary | ICD-10-CM | POA: Diagnosis not present

## 2024-08-31 DIAGNOSIS — G629 Polyneuropathy, unspecified: Secondary | ICD-10-CM | POA: Diagnosis not present

## 2024-08-31 DIAGNOSIS — I252 Old myocardial infarction: Secondary | ICD-10-CM | POA: Diagnosis not present

## 2024-08-31 DIAGNOSIS — Z7982 Long term (current) use of aspirin: Secondary | ICD-10-CM | POA: Diagnosis not present

## 2024-08-31 DIAGNOSIS — Z01818 Encounter for other preprocedural examination: Secondary | ICD-10-CM | POA: Diagnosis not present

## 2024-08-31 DIAGNOSIS — N529 Male erectile dysfunction, unspecified: Secondary | ICD-10-CM | POA: Diagnosis not present

## 2024-09-01 ENCOUNTER — Ambulatory Visit

## 2024-09-02 ENCOUNTER — Ambulatory Visit: Admitting: Cardiology

## 2024-09-05 ENCOUNTER — Ambulatory Visit: Admitting: Orthopedic Surgery

## 2024-09-08 ENCOUNTER — Ambulatory Visit

## 2024-09-08 ENCOUNTER — Ambulatory Visit (INDEPENDENT_AMBULATORY_CARE_PROVIDER_SITE_OTHER)

## 2024-09-08 VITALS — BP 106/66 | HR 75 | Ht 67.0 in | Wt 165.0 lb

## 2024-09-08 DIAGNOSIS — Z0000 Encounter for general adult medical examination without abnormal findings: Secondary | ICD-10-CM

## 2024-09-08 NOTE — Patient Instructions (Signed)
 Mr. Vanaman,  Thank you for taking the time for your Medicare Wellness Visit. I appreciate your continued commitment to your health goals. Please review the care plan we discussed, and feel free to reach out if I can assist you further.  Please note that Annual Wellness Visits do not include a physical exam. Some assessments may be limited, especially if the visit was conducted virtually. If needed, we may recommend an in-person follow-up with your provider.  Ongoing Care Seeing your primary care provider every 3 to 6 months helps us  monitor your health and provide consistent, personalized care.    Referrals If a referral was made during today's visit and you haven't received any updates within two weeks, please contact the referred provider directly to check on the status.  Recommended Screenings:  Health Maintenance  Topic Date Due   Zoster (Shingles) Vaccine (2 of 2) 11/19/2020   Cologuard (Stool DNA test)  11/23/2023   Flu Shot  04/29/2024   Medicare Annual Wellness Visit  05/13/2024   COVID-19 Vaccine (6 - 2025-26 season) 05/30/2024   DTaP/Tdap/Td vaccine (2 - Td or Tdap) 09/16/2026   Pneumococcal Vaccine for age over 25  Completed   Hepatitis C Screening  Completed   Meningitis B Vaccine  Aged Out       09/06/2024    4:52 PM  Advanced Directives  Does Patient Have a Medical Advance Directive? Yes  Type of Estate Agent of [place];Living will  Does patient want to make changes to medical advance directive? No - Patient declined  Copy of Healthcare Power of Attorney in Chart? No - copy requested  Would patient like information on creating a medical advance directive? No - Guardian declined    Vision: Annual vision screenings are recommended for early detection of glaucoma, cataracts, and diabetic retinopathy. These exams can also reveal signs of chronic conditions such as diabetes and high blood pressure.  Dental: Annual dental screenings help detect  early signs of oral cancer, gum disease, and other conditions linked to overall health, including heart disease and diabetes.  Please see the attached documents for additional preventive care recommendations.

## 2024-09-08 NOTE — Progress Notes (Signed)
 I connected with  [name] Short on 09/08/2024 by a audio enabled telemedicine application and verified that I am speaking with the correct person using two identifiers.  Patient Location: Home  Provider Location: Home Office  Persons Participating in Visit: Patient.  I discussed the limitations of evaluation and management by telemedicine. The patient expressed understanding and agreed to proceed.  Vital Signs: Because this visit was a virtual/telehealth visit, some criteria may be missing or patient reported. Any vitals not documented were not able to be obtained and vitals that have been documented are patient reported.  Chief Complaint  Patient presents with   Medicare Wellness     Subjective:   [name] Short is a 78 y.o. male who presents for a Medicare Annual Wellness Visit.  Visit info / Clinical Intake: Medicare Wellness Visit Type:: Subsequent Annual Wellness Visit Persons participating in visit and providing information:: patient Medicare Wellness Visit Mode:: Telephone If telephone:: video declined Since this visit was completed virtually, some vitals may be partially provided or unavailable. Missing vitals are due to the limitations of the virtual format.: Documented vitals are patient reported If Telephone or Video please confirm:: I connected with patient using audio/video enable telemedicine. I verified patient identity with two identifiers, discussed telehealth limitations, and patient agreed to proceed. Patient Location:: home Provider Location:: home office Interpreter Needed?: No Pre-visit prep was completed: yes AWV questionnaire completed by patient prior to visit?: yes Date:: 09/06/24 Living arrangements:: (Patient-Rptd) lives with spouse/significant other Patient's Overall Health Status Rating: (Patient-Rptd) very good Typical amount of pain: (Patient-Rptd) some Does pain affect daily life?: no Are you currently prescribed opioids?: no  Dietary  Habits and Nutritional Risks How many meals a day?: (Patient-Rptd) 2 Eats fruit and vegetables daily?: (Patient-Rptd) yes Most meals are obtained by: (Patient-Rptd) having others provide food In the last 2 weeks, have you had any of the following?: none Diabetic:: no  Functional Status Activities of Daily Living (to include ambulation/medication): Independent Ambulation: Independent with device- listed below Home Assistive Devices/Equipment: Walker (specify Type) Medication Administration: (Patient-Rptd) Independent Home Management (perform basic housework or laundry): (Patient-Rptd) Needs assistance (comment) Manage your own finances?: (Patient-Rptd) yes Primary transportation is: (Patient-Rptd) driving; family / friends Concerns about vision?: no *vision screening is required for WTM* Concerns about hearing?: no  Fall Screening Falls in the past year?: (Patient-Rptd) 1 Number of falls in past year: (Patient-Rptd) 1 Was there an injury with Fall?: (Patient-Rptd) 0 Fall Risk Category Calculator: (Patient-Rptd) 2 Patient Fall Risk Level: (Patient-Rptd) Moderate Fall Risk  Fall Risk Patient at Risk for Falls Due to: History of fall(s); Impaired mobility Fall risk Follow up: Falls evaluation completed; Education provided; Falls prevention discussed  Home and Transportation Safety: All rugs have non-skid backing?: (!) (Patient-Rptd) no All stairs or steps have railings?: (!) (Patient-Rptd) no Grab bars in the bathtub or shower?: (Patient-Rptd) yes Have non-skid surface in bathtub or shower?: (Patient-Rptd) yes Good home lighting?: (Patient-Rptd) yes Regular seat belt use?: (Patient-Rptd) yes Hospital stays in the last year:: (Patient-Rptd) no  Cognitive Assessment Difficulty concentrating, remembering, or making decisions? : (Patient-Rptd) no Will 6CIT or Mini Cog be Completed: yes What year is it?: 0 points What month is it?: 0 points Give patient an address phrase to remember  (5 components): remember words apple, table, penny About what time is it?: 0 points Count backwards from 20 to 1: 0 points Say the months of the year in reverse: 0 points Repeat the address phrase from earlier: 0 points  6 CIT Score: 0 points  Advance Directives (For Healthcare) Does Patient Have a Medical Advance Directive?: Yes Does patient want to make changes to medical advance directive?: No - Patient declined Type of Advance Directive: Healthcare Power of [place]; Living will Copy of Healthcare Power of Attorney in Chart?: No - copy requested Copy of Living Will in Chart?: No - copy requested Would patient like information on creating a medical advance directive?: No - Guardian declined  Reviewed/Updated  Reviewed/Updated: Reviewed All (Medical, Surgical, Family, Medications, Allergies, Care Teams, Patient Goals)    Allergies (verified) Shellfish protein-containing drug products and Warfarin sodium   Current Medications (verified) Outpatient Encounter Medications as of 09/08/2024  Medication Sig   acetaminophen  (TYLENOL ) 500 MG tablet Take 500-1,000 mg by mouth every 6 (six) hours as needed for moderate pain.   allopurinol  (ZYLOPRIM ) 300 MG tablet Take 1 tablet (300 mg total) by mouth daily.   amiodarone  (PACERONE ) 200 MG tablet Take one tablet by mouth twice daily x 2 weeks then decrease to one tablet by mouth daily   aspirin  EC 81 MG tablet Take 1 tablet (81 mg total) by mouth daily. Swallow whole.   ciclopirox  (PENLAC ) 8 % solution Apply topically at bedtime. Apply over nail and surrounding skin. Apply daily over previous coat. After seven (7) days, may remove with alcohol and continue cycle.   colchicine  0.6 MG tablet Take 1 tablet (0.6 mg total) by mouth daily as needed (gout).   diphenhydrAMINE  (ALLERGY RELIEF) 25 mg capsule Take 25 mg by mouth every 6 (six) hours as needed.   fluticasone  (FLONASE ) 50 MCG/ACT nasal spray Place 2 sprays into both nostrils daily.    melatonin 5 MG TABS Take 5 mg by mouth at bedtime as needed (sleep).   metoprolol  succinate (TOPROL -XL) 50 MG 24 hr tablet Take 1 tablet (50 mg total) by mouth daily. Take with or immediately following a meal.   Multiple Vitamin (MULTIVITAMIN WITH MINERALS) TABS Take 1 tablet by mouth daily.   nitroGLYCERIN  (NITROSTAT ) 0.4 MG SL tablet Place 1 tablet (0.4 mg total) under the tongue every 5 (five) minutes as needed for chest pain.   Polyethylene Glycol 400 (BLINK TEARS) 0.25 % SOLN Place 1 drop into both eyes 2 (two) times daily as needed (for dryness or irritation).   rosuvastatin  (CRESTOR ) 40 MG tablet TAKE 1 TABLET BY MOUTH DAILY   sodium chloride  (OCEAN) 0.65 % SOLN nasal spray Place 1 spray into both nostrils as needed for congestion.   tadalafil (CIALIS) 5 MG tablet Take 5 mg by mouth daily as needed for erectile dysfunction.   traMADol  (ULTRAM ) 50 MG tablet TAKE 1 TABLET BY MOUTH EVERY 6 HOURS AS NEEDED   trolamine salicylate (ASPERCREME) 10 % cream Apply 1 Application topically as needed for muscle pain (Heel).   predniSONE  (DELTASONE ) 10 MG tablet Take 2 tablets (20 mg total) by mouth 2 (two) times daily. (Patient not taking: Reported on 09/08/2024)   No facility-administered encounter medications on file as of 09/08/2024.    History: Past Medical History:  Diagnosis Date   Arrhythmia Jan 2022   Atrial fibrillation   Arthritis    WRIST   Benign localized prostatic hyperplasia with lower urinary tract symptoms (LUTS)    Complication of anesthesia    slow to wake   ED (erectile dysfunction)    GERD (gastroesophageal reflux disease)    History of concussion    AS CHILD--  NO RESIDUAL   History of gout    History  of kidney stones    Hyperlipidemia    Inguinal hernia, bilateral    Myocardial infarction (HCC)    Nephrolithiasis    BILATERAL   PAF (paroxysmal atrial fibrillation) (HCC) currently being followed by pcp   EPISODE --  2011  FOLLOW-UP W/ DR WALL  /  HAS NOT SEEN ANY  CARDIOLOGIST SINCE   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 Cindie   Stroke [facility]) Jan 2022   Ischemic Stroke   Past Surgical History:  Procedure Laterality Date   APPENDECTOMY  1983   CARDIOVASCULAR STRESS TEST  05-08-2010  DR WALL   NO EVIDENCE OF SCAR OR ISCHEMIA/ EF 54%/  PT HAD BOTH AFIB/ AFLUTTER DURING STUDY   CARPAL TUNNEL RELEASE Right 01/ 14/ 2021   CORONARY STENT INTERVENTION N/A 05/02/2022   Procedure: CORONARY STENT INTERVENTION;  Surgeon: Verlin Lonni BIRCH, MD;  Location: [facility];  Service: Cardiovascular;  Laterality: N/A;   CORONARY/GRAFT ACUTE MI REVASCULARIZATION N/A 05/01/2022   Procedure: Coronary/Graft Acute MI Revascularization;  Surgeon: Verlin Lonni BIRCH, MD;  Location: [facility];  Service: Cardiovascular;  Laterality: N/A;   CYSTOSCOPY W/ URETERAL STENT PLACEMENT Left 09/20/2013   Procedure: CYSTOSCOPY WITH STENT REPLACEMENT;  Surgeon: Donnice Gwenyth Brooks, MD;  Location[facility];  Service: Urology;  Laterality: Left;   CYSTOSCOPY WITH URETEROSCOPY Left 09/20/2013   Procedure: CYSTOSCOPY WITH URETEROSCOPY;  Surgeon: Donnice Gwenyth Brooks, MD;  Location[facility];  Service: Urology;  Laterality: Left;   CYSTOSCOPY WITH URETEROSCOPY AND STENT PLACEMENT Left 07/12/2013   Procedure: CYSTOSCOPY WITH LITHOLAPEXY, LEFT URETERAL STENT PLACEMENT;  Surgeon: Donnice Gwenyth Brooks, MD;  Location: [facility];  Service: Urology;  Laterality: Left;   CYSTOSCOPY WITH URETEROSCOPY AND STENT PLACEMENT Bilateral 06/23/2014   Procedure: BILATERAL URETEROSCOPY, HOLMIUM LASER LITHOTRIPSY AND STENT PLACEMENT, RIGHT ;  Surgeon: Donnice Brooks, MD;  Location: [facility];  Service: Urology;  Laterality: Bilateral;   EXTRACORPOREAL SHOCK WAVE LITHOTRIPSY  X2   EXTRACORPOREAL SHOCK WAVE LITHOTRIPSY Left 08-29-2013;   07-28-2013    EXTRACORPOREAL SHOCK WAVE LITHOTRIPSY Right 12/09/2018   Procedure: EXTRACORPOREAL SHOCK WAVE LITHOTRIPSY (ESWL);  Surgeon: Brooks Donnice, MD;  Location: [facility];  Service: Urology;  Laterality: Right;   HERNIA REPAIR     HOLMIUM LASER APPLICATION Left 09/20/2013   Procedure: HOLMIUM LASER APPLICATION;  Surgeon: Donnice Gwenyth Brooks, MD;  Location: [facility];  Service: Urology;  Laterality: Left;   INGUINAL HERNIA REPAIR Bilateral 10/16/2020   Procedure: BILATERAL OPEN INGUINAL HERNIA REPAIR WITH MESH;  Surgeon: Vernetta Berg, MD;  Location: [facility] ;  Service: General;  Laterality: Bilateral;  LMA/TAP BLOCK   JOINT REPLACEMENT     LAPAROSCOPIC INGUINAL HERNIA REPAIR Bilateral 09/04/2010   w/ mesh   LEFT ATRIAL APPENDAGE OCCLUSION N/A 07/17/2022   Procedure: LEFT ATRIAL APPENDAGE OCCLUSION;  Surgeon: Cindie Ole DASEN, MD;  [facility]: [facility];  Service: Cardiovascular;  Laterality: N/A;   LEFT HEART CATH AND CORONARY ANGIOGRAPHY N/A 05/01/2022   Procedure: LEFT HEART CATH AND CORONARY ANGIOGRAPHY;  Surgeon: Verlin Lonni BIRCH, MD;  [facility]: [facility];  Service: Cardiovascular;  Laterality: N/A;   TEE WITHOUT CARDIOVERSION N/A 07/17/2022   Procedure: TRANSESOPHAGEAL ECHOCARDIOGRAM (TEE);  Surgeon: Cindie Ole DASEN, MD;  Location: [facility] INVASIVE [facility];  Service: Cardiovascular;  Laterality: N/A;   TONSILLECTOMY  AS CHILD   TOTAL  HIP ARTHROPLASTY Left 05/11/2002   TOTAL HIP ARTHROPLASTY Right 11/15/2012   Procedure: RIGHT TOTAL HIP ARTHROPLASTY ANTERIOR APPROACH;  Surgeon: Oneil JAYSON Herald, MD;  Location: [facility];  Service: Orthopedics;  Laterality: Right;  Right total hip arthroplasty   TRANSTHORACIC ECHOCARDIOGRAM  05/08/2010   NORMAL LVSF/  EF 55%/  GRADE I DIASTOLIC DYSFUNCTION/  MILD BILATERAL ATRIUM ENLARGEMENT   Watchemn device  07/07/2022   Family History  Problem Relation Age of Onset   Diabetes Mother    Arthritis  Mother    Rheum arthritis Mother    Dementia Mother    Heart disease Father        Pacemaker   Prostate cancer Father    Arrhythmia Father    Hypertension Father    Hyperlipidemia Father    Diabetes Sister    Diabetes Maternal Grandmother    Diabetes Maternal Grandfather    Bipolar disorder Brother    Diabetes Brother    Dementia Brother    Neuropathy Brother    Social History   Occupational History   Occupation: retired  Tobacco Use   Smoking status: Former    Types: Cigars   Smokeless tobacco: Never   Tobacco comments:    AVERAGE 3 CIGARS PER WEEK  Vaping Use   Vaping status: Never Used  Substance and Sexual Activity   Alcohol use: Not Currently   Drug use: Never   Sexual activity: Yes   Tobacco Counseling Counseling given: Not Answered Tobacco comments: AVERAGE 3 CIGARS PER WEEK  SDOH Screenings   Food Insecurity: No Food Insecurity (09/06/2024)  Housing: Low Risk (09/06/2024)  Transportation Needs: No Transportation Needs (09/06/2024)  Utilities: Not At Risk (09/08/2024)  Alcohol Screen: Low Risk (09/06/2024)  Depression (PHQ2-9): Low Risk (09/08/2024)  Financial Resource Strain: Low Risk (09/06/2024)  Physical Activity: Patient Declined (09/06/2024)  Social Connections: Unknown (09/06/2024)  Stress: No Stress Concern Present (09/06/2024)  Tobacco Use: Medium Risk (09/08/2024)  Health Literacy: Adequate Health Literacy (09/08/2024)   See flowsheets for full screening details  Depression Screen PHQ 2 & 9 Depression Scale- Over the past 2 weeks, how often have you been bothered by any of the following problems? Little interest or pleasure in doing things: 0 Feeling down, depressed, or hopeless (PHQ Adolescent also includes...irritable): 0 PHQ-2 Total Score: 0 Trouble falling or staying asleep, or sleeping too much: 0 Feeling tired or having little energy: 0 Poor appetite or overeating (PHQ Adolescent also includes...weight loss): 0 Feeling bad about yourself -  or that you are a failure or have let yourself or your family down: 0 Trouble concentrating on things, such as reading the newspaper or watching television (PHQ Adolescent also includes...like school work): 0 Moving or speaking so slowly that other people could have noticed. Or the opposite - being so fidgety or restless that you have been moving around a lot more than usual: 0 Thoughts that you would be better off dead, or of hurting yourself in some way: 0 PHQ-9 Total Score: 0 If you checked off any problems, how difficult have these problems made it for you to do your work, take care of things at home, or get along with other people?: Not difficult at all  Depression Treatment Depression Interventions/Treatment : EYV7-0 Score <4 Follow-up Not Indicated     Goals Addressed               This Visit's Progress     Patient Stated (pt-stated)        To  be able to walk regular             Objective:    Today's Vitals   09/08/24 0857  BP: 106/66  Pulse: 75  Weight: 165 lb (74.8 kg)  Height: 5' 7 (1.702 m)   Body mass index is 25.84 kg/m.  Hearing/Vision screen Hearing Screening - Comments:: No difficulties  Vision Screening - Comments:: Patient is up to date and goes to [facility] on [address] in [place] Immunizations and Health Maintenance Health Maintenance  Topic Date Due   Zoster Vaccines- Shingrix (2 of 2) 11/19/2020   Fecal DNA (Cologuard)  11/23/2023   Influenza Vaccine  04/29/2024   Medicare Annual Wellness (AWV)  05/13/2024   COVID-19 Vaccine (6 - 2025-26 season) 05/30/2024   DTaP/Tdap/Td (2 - Td or Tdap) 09/16/2026   Pneumococcal Vaccine: 50+ Years  Completed   Hepatitis C Screening  Completed   Meningococcal B Vaccine  Aged Out        Assessment/Plan:  This is a routine wellness examination for Tobin.  Patient Care Team: Mahlon Comer BRAVO, MD as PCP - General Kate Lonni CROME, MD as PCP - Cardiology (Cardiology) Cindie Ole DASEN, MD as PCP - Electrophysiology (Cardiology) Nieves Cough, MD as Consulting Physician (Urology) Barbarann Oneil BROCKS, MD (Inactive) as Consulting Physician (Orthopedic Surgery) Avram Lupita BRAVO, MD as Consulting Physician (Gastroenterology) Lynnell Nottingham, MD as Consulting Physician (Dermatology) Vanderbilt Ned, MD as Consulting Physician (General Surgery) Vernetta Berg, MD as Consulting Physician (General Surgery) Pandora Cadet, [facility] as Pharmacist (Pharmacist) Georgina Ozell LABOR, MD as Consulting Physician (Orthopedic Surgery)  I have personally reviewed and noted the following in the patients chart:   Medical and social history Use of alcohol, tobacco or illicit drugs  Current medications and supplements including opioid prescriptions. Functional ability and status Nutritional status Physical activity Advanced directives List of other physicians Hospitalizations, surgeries, and ER visits in previous 12 months Vitals Screenings to include cognitive, depression, and falls Referrals and appointments  No orders of the defined types were placed in this encounter.  In addition, I have reviewed and discussed with patient certain preventive protocols, quality metrics, and best practice recommendations. A written personalized care plan for preventive services as well as general preventive health recommendations were provided to patient.   Lyle MARLA Right, [place]   09/08/2024   No follow-ups on file.  After Visit Summary: (MyChart) Due to this being a telephonic visit, the after visit summary with patients personalized plan was offered to patient via MyChart   No voiced or noted concerns at this time Patient advised to keep follow-up appointment with PCP (11/23/2024) Vaccines not given: shingles, flu and covid declined today HM Addressed: patient declined wanting to get a cologuard

## 2024-09-09 DIAGNOSIS — M545 Low back pain, unspecified: Secondary | ICD-10-CM | POA: Diagnosis not present

## 2024-09-09 DIAGNOSIS — M48062 Spinal stenosis, lumbar region with neurogenic claudication: Secondary | ICD-10-CM | POA: Diagnosis not present

## 2024-09-09 DIAGNOSIS — M2578 Osteophyte, vertebrae: Secondary | ICD-10-CM | POA: Diagnosis not present

## 2024-09-12 ENCOUNTER — Ambulatory Visit: Payer: Self-pay | Admitting: *Deleted

## 2024-09-12 NOTE — Telephone Encounter (Signed)
 Unfortunately I do not do catheter management.  I am happy to see him in the office but I am afraid I won't be able to help him.  He needs to speak to his surgeon ASAP regarding the plan for catheter removal or urology follow up.  The surgeon's office may be able to get him an appt w/ urology sooner since he is a surgical pt that I suspect was having urinary retention but I'm not sure of the exact situation

## 2024-09-12 NOTE — Telephone Encounter (Signed)
 FYI Only or Action Required?: FYI only for provider: appointment scheduled on 09/15/24.  Patient was last seen in primary care on 05/23/2024 by Mahlon Comer BRAVO, MD.  Called Nurse Triage reporting Advice Only. Regarding removal of urinary catheter Symptoms began several days ago.  Interventions attempted: Other: difficulty walking, or mobility due to pain from catheter site.  Symptoms are: gradually worsening.  Triage Disposition: Call PCP When Office is Open  Patient/caregiver understands and will follow disposition?: Yes   Please advise if patient can be seen earlier than 09/15/24. Patient discharged home with urinary catheter Friday after s/p laminectomy. Patient wife reports f/u recommended with urology. Earliest appt April. Patient c/o pain at catheter site with any movement. Wife reports hindering recovery. Catheter draining well no blood noted in bag. Reviewed to keep catheter bag below bladder level . Please advise.   Recommended if sx worsen go to ED for assistance.     Copied from CRM #[id number]. Topic: Clinical - Red Word Triage >> Sep 12, 2024  2:50 PM Jayma L wrote: Red Word that prompted transfer to Nurse Triage:  Dawn calling for husband -has a cath removed by Friday - surgeon put it in .SABRA Cant walk around like he should because of cath  , hindering ability to heal. And patient isnt eating , fatigue and doesn't feel good Reason for Disposition  [1] Caller requesting NON-URGENT health information AND [2] PCP's office is the best resource  Answer Assessment - Initial Assessment Questions 1. REASON FOR CALL: What is the main reason for your call? or How can I best help you?     Urinary catheter discomfort, not able to walk without pain from catheter 2. SYMPTOMS : Do you have any symptoms?      Pain at catheter site s/p laminectomy 3. OTHER QUESTIONS: Do you have any other questions?     Can catheter be removed? Or does patient have to wait 1 week from surgery.  F/u appt with urology not until April.  Appt scheduled for f/u hospital visit 09/15/24  Protocols used: Information Only Call - No Triage-A-AH

## 2024-09-12 NOTE — Telephone Encounter (Signed)
 Patient is wanting to be seen sooner. I see you have a 10:40am tomorrow. Do you want me to move him if it is still available?

## 2024-09-12 NOTE — Telephone Encounter (Signed)
 Called patient and relayed message below. Patients wife will call back surgeon and urology and press how urgent this is. They canceled 09/15/24 appt. Wife wanted you to know that she called the surgeons office and they said call urology. Patients wife called urology and they said yes they would seem him in April.

## 2024-09-15 ENCOUNTER — Inpatient Hospital Stay: Admitting: Family Medicine

## 2024-10-19 NOTE — Progress Notes (Unsigned)
 " Cardiology Office Note:    Date:  10/20/2024   ID:  [name] Short, DOB 19-Feb-1946, MRN [id number]  PCP:  Mahlon Comer BRAVO, MD  Cardiologist:  Lonni LITTIE Nanas, MD  Electrophysiologist:  OLE ONEIDA HOLTS, MD (Inactive)   Referring MD: Mahlon Comer BRAVO, MD   No chief complaint on file.    History of Present Illness:    [name] Short is a 79 y.o. 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  He was initially seen on 11/27/2020.  He has a history of paroxysmal atrial fibrillation but has not been on anticoagulation.  He presented to the ED with slurred speech and left hand weakness on 10/21/20, was found to have acute CVA.  MRI brain showed acute infarct in the posterior right frontal lobe.  He had residual left-sided weakness and dysarthria.  Echocardiogram showed normal LVEF.  He was started on Eliquis  5 mg twice daily, stroke likely secondary to atrial fibrillation.  He had paroxysmal atrial fibrillation while in the hospital, heart rate 90s to 100s while in A. fib.  He was started on Cardizem .  He presented to [facility]/01/22 with chest pain, found to have inferior STEMI.  Cath showed occluded distal LAD, treated with DES x1.  Also noted to have severe stenosis in mid LAD for which he underwent staged intervention with DES x1.  Plan was for triple therapy for 1 month with aspirin , Plavix , and Eliquis  then stop aspirin .  Echocardiogram showed EF 50 to 55%.  Cardiac MRI was done on 12/03/2022 which showed normal LV size, wall thickness, and systolic function (EF 58%), normal RV size and systolic function (EF 58%), no LGE.  Zio patch x14 days 01/2021 showed 18% A. fib burden with longest episode lasting 1 day 7 hours, average heart rate in A. fib 80 bpm.  Also showed 5 episodes of SVT, longest lasting 18 seconds.  He had ongoing issues with hematuria and was referred to Dr. Holts for [facility]  evaluation.  Underwent successful watchman placement 06/2022.  He was noted to be in A-fib at clinic visit 12/2023 and Zio patch x 10 days was done, which showed 45% A-fib burden with average rate 99 bpm.  Metoprolol  dose was increased.  Echocardiogram 01/2024 showed EF 55 to 60%, normal RV function, no significant valvular disease.  At clinic visit 07/21/2024, noted to be hypotensive down to SBP 80s and in A-fib with RVR.  He was sent to ED.  Improved with IV fluids and beta-blocker for rate control.  Since last clinic visit, he reports he is doing well.  Recovering from surgery.  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  CMR has been ordered with [facility].  Denies any chest pain, dyspnea, lightheadedness, syncope, lower extremity edema, or palpitations.  Past Medical History:  Diagnosis Date   Arrhythmia Jan 2022   Atrial fibrillation   Arthritis    WRIST   Benign localized prostatic hyperplasia with lower urinary tract symptoms (LUTS)    Complication of anesthesia    slow to wake   ED (erectile dysfunction)    GERD (gastroesophageal reflux disease)    History of concussion    AS CHILD--  NO RESIDUAL   History of gout    History of kidney stones    Hyperlipidemia    Inguinal hernia, bilateral    Myocardial infarction (HCC)    Nephrolithiasis    BILATERAL  PAF (paroxysmal atrial fibrillation) (HCC) currently being followed by pcp   EPISODE --  2011  FOLLOW-UP W/ DR WALL  /  HAS NOT SEEN ANY CARDIOLOGIST SINCE   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 Cindie   Stroke [facility]) Jan 2022   Ischemic Stroke    Past Surgical History:  Procedure Laterality Date   APPENDECTOMY  1983   CARDIOVASCULAR STRESS TEST  05-08-2010  DR WALL   NO EVIDENCE OF SCAR OR ISCHEMIA/ EF 54%/  PT HAD BOTH AFIB/ AFLUTTER DURING STUDY   CARPAL TUNNEL RELEASE Right 01/ 14/ 2021   CORONARY STENT INTERVENTION N/A 05/02/2022   Procedure:  CORONARY STENT INTERVENTION;  Surgeon: Verlin Lonni BIRCH, MD;  [facility]: [facility];  Service: Cardiovascular;  Laterality: N/A;   CORONARY/GRAFT ACUTE MI REVASCULARIZATION N/A 05/01/2022   Procedure: Coronary/Graft Acute MI Revascularization;  Surgeon: Verlin Lonni BIRCH, MD;  Location: [facility];  Service: Cardiovascular;  Laterality: N/A;   CYSTOSCOPY W/ URETERAL STENT PLACEMENT Left 09/20/2013   Procedure: CYSTOSCOPY WITH STENT REPLACEMENT;  Surgeon: Donnice Gwenyth Brooks, MD;  Location[facility];  Service: Urology;  Laterality: Left;   CYSTOSCOPY WITH URETEROSCOPY Left 09/20/2013   Procedure: CYSTOSCOPY WITH URETEROSCOPY;  Surgeon: Donnice Gwenyth Brooks, MD;  Location: [facility];  Service: Urology;  Laterality: Left;   CYSTOSCOPY WITH URETEROSCOPY AND STENT PLACEMENT Left 07/12/2013   Procedure: CYSTOSCOPY WITH LITHOLAPEXY, LEFT URETERAL STENT PLACEMENT;  Surgeon: Donnice Gwenyth Brooks, MD;  Location: [facility];  Service: Urology;  Laterality: Left;   CYSTOSCOPY WITH URETEROSCOPY AND STENT PLACEMENT Bilateral 06/23/2014   Procedure: BILATERAL URETEROSCOPY, HOLMIUM LASER LITHOTRIPSY AND STENT PLACEMENT, RIGHT ;  Surgeon: Donnice Brooks, MD;  Location: [facility];  Service: Urology;  Laterality: Bilateral;   EXTRACORPOREAL SHOCK WAVE LITHOTRIPSY  X2   EXTRACORPOREAL SHOCK WAVE LITHOTRIPSY Left 08-29-2013;   07-28-2013   EXTRACORPOREAL SHOCK WAVE LITHOTRIPSY Right 12/09/2018   Procedure: EXTRACORPOREAL SHOCK WAVE LITHOTRIPSY (ESWL);  Surgeon: Brooks Donnice, MD;  Location: [facility];  Service: Urology;  Laterality: Right;   HERNIA REPAIR     HOLMIUM LASER APPLICATION Left 09/20/2013   Procedure: HOLMIUM LASER APPLICATION;  Surgeon: Donnice Gwenyth Brooks, MD;  Location: [facility];  Service: Urology;  Laterality: Left;   INGUINAL HERNIA REPAIR Bilateral 10/16/2020   Procedure:  BILATERAL OPEN INGUINAL HERNIA REPAIR WITH MESH;  Surgeon: Vernetta Berg, MD;  Location: [facility];  Service: General;  Laterality: Bilateral;  LMA/TAP BLOCK   JOINT REPLACEMENT     LAPAROSCOPIC INGUINAL HERNIA REPAIR Bilateral 09/04/2010   w/ mesh   LEFT ATRIAL APPENDAGE OCCLUSION N/A 07/17/2022   Procedure: LEFT ATRIAL APPENDAGE OCCLUSION;  Surgeon: Cindie Ole DASEN, MD;  [facility]: [facility];  Service: Cardiovascular;  Laterality: N/A;   LEFT HEART CATH AND CORONARY ANGIOGRAPHY N/A 05/01/2022   Procedure: LEFT HEART CATH AND CORONARY ANGIOGRAPHY;  Surgeon: Verlin Lonni BIRCH, MD;  [facility]: [facility];  Service: Cardiovascular;  Laterality: N/A;   TEE WITHOUT CARDIOVERSION N/A 07/17/2022   Procedure: TRANSESOPHAGEAL ECHOCARDIOGRAM (TEE);  Surgeon: Cindie Ole DASEN, MD;  Location: [facility];  Service: Cardiovascular;  Laterality: N/A;   TONSILLECTOMY  AS CHILD   TOTAL HIP ARTHROPLASTY Left 05/11/2002   TOTAL HIP ARTHROPLASTY Right 11/15/2012   Procedure: RIGHT TOTAL HIP ARTHROPLASTY ANTERIOR APPROACH;  Surgeon: Oneil JAYSON Herald, MD;  Location:  [facility];  Service: Orthopedics;  Laterality: Right;  Right total hip arthroplasty   TRANSTHORACIC ECHOCARDIOGRAM  05/08/2010   NORMAL LVSF/  EF 55%/  GRADE I DIASTOLIC DYSFUNCTION/  MILD BILATERAL [facility] ENLARGEMENT   Watchemn device  07/07/2022    Current Medications: Current Meds  Medication Sig   acetaminophen  (TYLENOL ) 500 MG tablet Take 500-1,000 mg by mouth every 6 (six) hours as needed for moderate pain.   allopurinol  (ZYLOPRIM ) 300 MG tablet Take 1 tablet (300 mg total) by mouth daily.   amiodarone  (PACERONE ) 200 MG tablet Take one tablet by mouth twice daily x 2 weeks then decrease to one tablet by mouth daily   aspirin  EC 81 MG tablet Take 1 tablet (81 mg total) by mouth daily. Swallow whole.   ciclopirox  (PENLAC ) 8 % solution Apply topically at bedtime. Apply over nail and surrounding skin.  Apply daily over previous coat. After seven (7) days, may remove with alcohol and continue cycle.   colchicine  0.6 MG tablet Take 1 tablet (0.6 mg total) by mouth daily as needed (gout).   diphenhydrAMINE  (ALLERGY RELIEF) 25 mg capsule Take 25 mg by mouth every 6 (six) hours as needed.   fluticasone  (FLONASE ) 50 MCG/ACT nasal spray Place 2 sprays into both nostrils daily.   LUTEIN PO Take 1 tablet by mouth daily.   melatonin 5 MG TABS Take 5 mg by mouth at bedtime as needed (sleep).   metoprolol  succinate (TOPROL -XL) 50 MG 24 hr tablet Take 1 tablet (50 mg total) by mouth daily. Take with or immediately following a meal.   Multiple Vitamin (MULTIVITAMIN WITH MINERALS) TABS Take 1 tablet by mouth daily.   nitroGLYCERIN  (NITROSTAT ) 0.4 MG SL tablet Place 1 tablet (0.4 mg total) under the tongue every 5 (five) minutes as needed for chest pain.   Polyethylene Glycol 400 (BLINK TEARS) 0.25 % SOLN Place 1 drop into both eyes 2 (two) times daily as needed (for dryness or irritation).   predniSONE  (DELTASONE ) 10 MG tablet Take 2 tablets (20 mg total) by mouth 2 (two) times daily.   rosuvastatin  (CRESTOR ) 40 MG tablet TAKE 1 TABLET BY MOUTH DAILY   sodium chloride  (OCEAN) 0.65 % SOLN nasal spray Place 1 spray into both nostrils as needed for congestion.   tadalafil (CIALIS) 5 MG tablet Take 5 mg by mouth daily as needed for erectile dysfunction.   tamsulosin  (FLOMAX ) 0.4 MG CAPS capsule Take by mouth.   traMADol  (ULTRAM ) 50 MG tablet TAKE 1 TABLET BY MOUTH EVERY 6 HOURS AS NEEDED   trolamine salicylate (ASPERCREME) 10 % cream Apply 1 Application topically as needed for muscle pain (Heel).     Allergies:   Shellfish protein-containing drug products and Warfarin sodium   Social History   Socioeconomic History   Marital status: Married    Spouse name: Dawn   Number of children: Not on file   Years of education: Not on file   Highest education level: Bachelor's degree (e.g., BA, AB, BS)  Occupational  History   Occupation: retired  Tobacco Use   Smoking status: Former    Types: Cigars   Smokeless tobacco: Never  Vaping Use   Vaping status: Never Used  Substance and Sexual Activity   Alcohol use: Not Currently   Drug use: Never   Sexual activity: Yes  Other Topics Concern   Not on file  Social History Narrative   Not on file   Social Drivers of Health   Tobacco Use: Medium Risk (10/20/2024)  Patient History    Smoking Tobacco Use: Former    Smokeless Tobacco Use: Never    Passive Exposure: Not on file  Financial Resource Strain: Low Risk (09/06/2024)   Overall Financial Resource Strain (CARDIA)    Difficulty of Paying Living Expenses: Not hard at all  Food Insecurity: No Food Insecurity (09/06/2024)   Epic    Worried About Programme Researcher, Broadcasting/film/video in the Last Year: Never true    Ran Out of Food in the Last Year: Never true  Transportation Needs: No Transportation Needs (09/06/2024)   Epic    Lack of Transportation (Medical): No    Lack of Transportation (Non-Medical): No  Physical Activity: Patient Declined (09/06/2024)   Exercise Vital Sign    Days of Exercise per Week: Patient declined    Minutes of Exercise per Session: Patient declined  Stress: No Stress Concern Present (09/06/2024)   [employer] of Occupational Health - Occupational Stress Questionnaire    Feeling of Stress: Only a little  Social Connections: Unknown (09/06/2024)   Social Connection and Isolation Panel    Frequency of Communication with Friends and Family: Patient declined    Frequency of Social Gatherings with Friends and Family: Patient declined    Attends Religious Services: Patient declined    Active Member of Clubs or Organizations: Patient declined    Attends Banker Meetings: Patient declined    Marital Status: Married  Depression (PHQ2-9): Low Risk (09/08/2024)   Depression (PHQ2-9)    PHQ-2 Score: 0  Alcohol Screen: Low Risk (09/06/2024)   Alcohol Screen    Last Alcohol  Screening Score (AUDIT): 1  Housing: Low Risk (09/06/2024)   Epic    Unable to Pay for Housing in the Last Year: No    Number of Times Moved in the Last Year: 0    Homeless in the Last Year: No  Utilities: Not At Risk (09/08/2024)   Epic    Threatened with loss of utilities: No  Health Literacy: Adequate Health Literacy (09/08/2024)   B1300 Health Literacy    Frequency of need for help with medical instructions: Never     Family History: The patient's family history includes Arrhythmia in his father; Arthritis in his mother; Bipolar disorder in his brother; Dementia in his brother and mother; Diabetes in his brother, maternal grandfather, maternal grandmother, mother, and sister; Heart disease in his father; Hyperlipidemia in his father; Hypertension in his father; Neuropathy in his brother; Prostate cancer in his father; Rheum arthritis in his mother.  ROS:   Please see the history of present illness.     All other systems reviewed and are negative.  EKGs/Labs/Other Studies Reviewed:     14-Day Monitor 01/03/2021: 18% A. fib burden, longest episode lasting 1 day 7 hours. Average heart rate in A. fib 80 bpm 5 episodes of SVT, longest lasting 18 seconds with average rate 94 bpm   Patch Wear Time:  14 days and 0 hours (2022-03-17T12:53:47-0400 to 2022-03-31T12:53:51-0400)   Patient had a min HR of 41 bpm, max HR of 144 bpm, and avg HR of 61 bpm. Predominant underlying rhythm was Sinus Rhythm. 5 Supraventricular Tachycardia runs occurred, the run with the fastest interval lasting 14 beats with a max rate of 133 bpm, the  longest lasting 17.9 secs with an avg rate of 94 bpm. Atrial Fibrillation occurred (18% burden), ranging from 49-144 bpm (avg of 80 bpm), the longest lasting 1 day 7 hours with an avg rate of 83 bpm. Isolated  SVEs were rare (<1.0%), SVE Couplets were  rare (<1.0%), and SVE Triplets were rare (<1.0%). Isolated VEs were rare (<1.0%), VE Couplets were rare (<1.0%), and no VE  Triplets were present.  No patient triggered events.  Echo 10/23/2020: 1. Pt in atrial fibrillation at time of study.   2. Left ventricular ejection fraction, by estimation, is 60 to 65%. The  left ventricle has normal function. The left ventricle has no regional  wall motion abnormalities. There is mild left ventricular hypertrophy.  Left ventricular diastolic function  could not be evaluated.   3. Right ventricular systolic function is normal. The right ventricular  size is normal. Tricuspid regurgitation signal is inadequate for assessing  PA pressure.   4. The mitral valve is normal in structure. Trivial mitral valve  regurgitation. No evidence of mitral stenosis.   5. The aortic valve is tricuspid. Aortic valve regurgitation is trivial.  No aortic stenosis is present.   6. The inferior vena cava is normal in size with greater than 50%  respiratory variability, suggesting right atrial pressure of 3 mmHg.   EKG:    10/20/2024: Sinus bradycardia, rate 56, QTc 432 08/03/2024: Normal sinus rhythm, rate 76, left axis deviation, QTc 427 07/21/2024: A-fib with RVR, rate 129, forward progression 01/01/24: Atrial fibrillation, rate 90, poor R wave progression 04/04/2022: Normal sinus rhythm, rate 65, no ST abnormality 08/2021: sinus rhythm, rate 54,no ST abnormalities 02/27/2021: normal sinus rhythm, rate 52, Nonspecific T wave flattening 11/27/2020: normal sinus rhythm, rate 65, no ST abnormalities  Recent Labs: 05/23/2024: ALT 16 07/21/2024: B Natriuretic Peptide 166.0; BUN 17; Creatinine, Ser 1.23; Hemoglobin 16.6; Magnesium 1.9; Platelets 226; Potassium 4.0; Sodium 138; TSH 3.427  Recent Lipid Panel    Component Value Date/Time   CHOL 112 05/23/2024 1000   TRIG 98.0 05/23/2024 1000   HDL 42.20 05/23/2024 1000   CHOLHDL 3 05/23/2024 1000   VLDL 19.6 05/23/2024 1000   LDLCALC 50 05/23/2024 1000   LDLDIRECT 128.1 05/17/2012 0901    Physical Exam:    VS:  BP 108/64   Pulse (!) 58   Ht  5' 7 (1.702 m)   Wt 160 lb 12.8 oz (72.9 kg)   SpO2 95%   BMI 25.18 kg/m     Wt Readings from Last 3 Encounters:  10/20/24 160 lb 12.8 oz (72.9 kg)  09/08/24 165 lb (74.8 kg)  08/03/24 148 lb 3.2 oz (67.2 kg)     GEN:  in no acute distress HEENT: Normal NECK: No JVD; No carotid bruits CARDIAC: Irregular, tachycardic, no murmurs RESPIRATORY:  Clear to auscultation without rales, wheezing or rhonchi  ABDOMEN: Soft, non-tender, non-distended MUSCULOSKELETAL:  No edema; No deformity  SKIN: Warm and dry NEUROLOGIC:  Alert and oriented x 3 PSYCHIATRIC:  Normal affect   ASSESSMENT:    1. Atrial fibrillation, unspecified type (HCC)   2. Essential hypertension   3. Amyloidosis, unspecified type (HCC)   4. Coronary artery disease involving native coronary artery of native heart without angina pectoris   5. Hyperlipidemia, unspecified hyperlipidemia type      PLAN:     Paroxysmal atrial fibrillation: CHA2DS2-VASc score 4 (age, hypertension, CVA x2).  Echocardiogram 10/23/2020 showed normal biventricular function, no significant valvular disease.  Zio patch x14 days showed 18% A. fib burden with longest episode lasting 1 day 7 hours, average heart rate in A. fib 80 bpm.  Also showed 5 episodes of SVT, longest lasting 18 seconds.  He has had ongoing issues with hematuria  due to kidney stones, for which he follows with urology.  Given this, was referred to Dr. Cindie and underwent Watchman placement 06/2022.  He was noted to be in A-fib at clinic visit 12/2023 and Zio patch x 10 days was done, which showed 45% A-fib burden with average rate 99 bpm.  Metoprolol  dose was increased.  Echocardiogram 01/2024 showed EF 55 to 60%, normal RV function, no significant valvular disease. -Completed 45 days of Eliquis  post watchman placement, have discontinued Eliquis  -Continue Toprol -XL 50 mg daily -Sleep study shows mild OSA, no indication for CPAP at this time - Currently in sinus rhythm in clinic  today.  Given high A-fib burden on recent monitor, and episode of A-fib with RVR and soft BP requiring ED visit 06/2024, and considering  upcoming surgery for his spinal stenosis, recommend started amiodarone  to maintain sinus rhythm.  Started amiodarone  200 mg twice daily x 2 weeks then reduced to 200 mg daily.  He is now recovering from surgery, appears maintaining sinus rhythm.  Will refer to EP for ablation evaluation to come off amiodarone .  Check CMET, TSH  CAD: He presented to [facility] with chest pain, found to have inferior STEMI.  Cath showed occluded distal LAD, treated with DES x1.  Also noted to have severe stenosis in mid LAD for which he underwent staged intervention with DES x1.  Echocardiogram showed EF 50 to 55%. -Continue aspirin  81 mg daily -Continue rosuvastatin  40 mg daily -Continue Toprol -XL   LVH: Noted on echocardiogram, also has history of bilateral carpal tunnel syndrome.  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  He is undergoing CMR with [facility], will follow-up results  CVA: Presented 10/21/2020 with left-sided weakness, found to have acute CVA.  Likely due to A. fib as above.   Hyperlipidemia: on rosuvastatin  40 mg daily.  LDL 26 on 10/2023  Hypertension: Continue Toprol -XL 25 mg daily.  Appears controlled  Snoring: Sleep study shows mild OSA, no indication for CPAP at this time  Hematuria: Likely due to kidney stones.  Follows with urology.   Given ongoing need for anticoagulation with atrial fibrillation and history of CVA, referred to Dr. Cindie and underwent watchman placement as above   RTC in 4 months    Medication Adjustments/Labs and Tests Ordered: Current medicines are reviewed at length with the patient today.  Concerns  regarding medicines are outlined above.  Orders Placed This Encounter  Procedures   Comprehensive Metabolic Panel (CMET)   TSH   Ambulatory referral to Cardiac Electrophysiology   EKG 12-Lead    No orders of the defined types were placed in this encounter.    Patient Instructions  Medication Instructions:  Your physician recommends that you continue on your current medications as directed. Please refer to the Current Medication list given to you today.  *If you need a refill on your cardiac medications before your next appointment, please call your pharmacy*  Lab Work: BMET, TSH Today If you have labs (blood work) drawn today and your tests are completely normal, you will receive your results only by: MyChart Message (if you have MyChart) OR A paper copy in the mail If you have any lab test that is abnormal or we need to change your treatment, we will call you to review the  results.  Testing/Procedures: None  Follow-Up: At [facility], you and your health needs are our priority.  As part of our continuing mission to provide you with exceptional heart care, our providers are all part of one team.  This team includes your primary Cardiologist (physician) and Advanced Practice Providers or APPs (Physician Assistants and Nurse Practitioners) who all work together to provide you with the care you need, when you need it.  Your next appointment:   4 months  Provider:   Dr. Kate   We recommend signing up for the patient portal called MyChart.  Sign up information is provided on this After Visit Summary.  MyChart is used to connect with patients for Virtual Visits (Telemedicine).  Patients are able to view lab/test results, encounter notes, upcoming appointments, etc.  Non-urgent messages can be sent to your provider as well.   To learn more about what you can do with MyChart, go to [email or web address].   Other Instructions Referral to EP               Signed, Lonni LITTIE Kate, MD  10/20/2024 4:16 PM    [facility] "

## 2024-10-20 ENCOUNTER — Encounter: Payer: Self-pay | Admitting: Cardiology

## 2024-10-20 ENCOUNTER — Ambulatory Visit: Attending: Cardiology | Admitting: Cardiology

## 2024-10-20 VITALS — BP 108/64 | HR 58 | Ht 67.0 in | Wt 160.8 lb

## 2024-10-20 DIAGNOSIS — I2510 Atherosclerotic heart disease of native coronary artery without angina pectoris: Secondary | ICD-10-CM | POA: Insufficient documentation

## 2024-10-20 DIAGNOSIS — I10 Essential (primary) hypertension: Secondary | ICD-10-CM | POA: Diagnosis not present

## 2024-10-20 DIAGNOSIS — E785 Hyperlipidemia, unspecified: Secondary | ICD-10-CM | POA: Insufficient documentation

## 2024-10-20 DIAGNOSIS — E859 Amyloidosis, unspecified: Secondary | ICD-10-CM | POA: Diagnosis not present

## 2024-10-20 DIAGNOSIS — I4891 Unspecified atrial fibrillation: Secondary | ICD-10-CM | POA: Insufficient documentation

## 2024-10-20 NOTE — Patient Instructions (Signed)
 Medication Instructions:  Your physician recommends that you continue on your current medications as directed. Please refer to the Current Medication list given to you today.  *If you need a refill on your cardiac medications before your next appointment, please call your pharmacy*  Lab Work: BMET, TSH Today If you have labs (blood work) drawn today and your tests are completely normal, you will receive your results only by: MyChart Message (if you have MyChart) OR A paper copy in the mail If you have any lab test that is abnormal or we need to change your treatment, we will call you to review the results.  Testing/Procedures: None  Follow-Up: At [facility], you and your health needs are our priority.  As part of our continuing mission to provide you with exceptional heart care, our providers are all part of one team.  This team includes your primary Cardiologist (physician) and Advanced Practice Providers or APPs (Physician Assistants and Nurse Practitioners) who all work together to provide you with the care you need, when you need it.  Your next appointment:   4 months  Provider:   Dr. Kate   We recommend signing up for the patient portal called MyChart.  Sign up information is provided on this After Visit Summary.  MyChart is used to connect with patients for Virtual Visits (Telemedicine).  Patients are able to view lab/test results, encounter notes, upcoming appointments, etc.  Non-urgent messages can be sent to your provider as well.   To learn more about what you can do with MyChart, go to [email or web address].   Other Instructions Referral to EP

## 2024-10-21 ENCOUNTER — Ambulatory Visit: Payer: Self-pay | Admitting: Cardiology

## 2024-10-21 LAB — COMPREHENSIVE METABOLIC PANEL WITH GFR
ALT: 22 IU/L (ref 0–44)
AST: 27 IU/L (ref 0–40)
Albumin: 4.5 g/dL (ref 3.8–4.8)
Alkaline Phosphatase: 104 IU/L (ref 47–123)
BUN/Creatinine Ratio: 13 (ref 10–24)
BUN: 14 mg/dL (ref 8–27)
Bilirubin Total: 0.4 mg/dL (ref 0.0–1.2)
CO2: 24 mmol/L (ref 20–29)
Calcium: 9.8 mg/dL (ref 8.6–10.2)
Chloride: 104 mmol/L (ref 96–106)
Creatinine, Ser: 1.09 mg/dL (ref 0.76–1.27)
Globulin, Total: 2.8 g/dL (ref 1.5–4.5)
Glucose: 87 mg/dL (ref 70–99)
Potassium: 4.8 mmol/L (ref 3.5–5.2)
Sodium: 143 mmol/L (ref 134–144)
Total Protein: 7.3 g/dL (ref 6.0–8.5)
eGFR: 70 mL/min/1.73

## 2024-10-21 LAB — TSH: TSH: 4.25 u[IU]/mL (ref 0.450–4.500)

## 2024-11-23 ENCOUNTER — Ambulatory Visit: Admitting: Family Medicine

## 2024-12-09 ENCOUNTER — Ambulatory Visit: Admitting: Cardiology

## 2025-02-15 ENCOUNTER — Ambulatory Visit: Admitting: Cardiology
